# Patient Record
Sex: Female | Born: 1935 | Race: White | Hispanic: No | Marital: Married | Smoking: Never smoker
Health system: Southern US, Community
[De-identification: ages and names within clinical notes are randomized; demographics above are authoritative.]

## PROBLEM LIST (undated history)

## (undated) DIAGNOSIS — I779 Disorder of arteries and arterioles, unspecified: Secondary | ICD-10-CM

## (undated) DIAGNOSIS — I340 Nonrheumatic mitral (valve) insufficiency: Secondary | ICD-10-CM

## (undated) DIAGNOSIS — J449 Chronic obstructive pulmonary disease, unspecified: Secondary | ICD-10-CM

## (undated) DIAGNOSIS — I480 Paroxysmal atrial fibrillation: Secondary | ICD-10-CM

## (undated) DIAGNOSIS — G629 Polyneuropathy, unspecified: Secondary | ICD-10-CM

## (undated) DIAGNOSIS — I509 Heart failure, unspecified: Secondary | ICD-10-CM

## (undated) DIAGNOSIS — I10 Essential (primary) hypertension: Secondary | ICD-10-CM

## (undated) DIAGNOSIS — J45909 Unspecified asthma, uncomplicated: Secondary | ICD-10-CM

## (undated) DIAGNOSIS — E785 Hyperlipidemia, unspecified: Secondary | ICD-10-CM

## (undated) DIAGNOSIS — I050 Rheumatic mitral stenosis: Secondary | ICD-10-CM

## (undated) DIAGNOSIS — H409 Unspecified glaucoma: Secondary | ICD-10-CM

## (undated) DIAGNOSIS — I700 Atherosclerosis of aorta: Secondary | ICD-10-CM

## (undated) DIAGNOSIS — J189 Pneumonia, unspecified organism: Secondary | ICD-10-CM

## (undated) DIAGNOSIS — I2510 Atherosclerotic heart disease of native coronary artery without angina pectoris: Secondary | ICD-10-CM

## (undated) HISTORY — DX: Heart failure, unspecified: I50.9

## (undated) HISTORY — PX: APPENDECTOMY: SHX54

## (undated) HISTORY — PX: TONSILLECTOMY: SUR1361

## (undated) HISTORY — PX: CATARACT EXTRACTION: SUR2

## (undated) HISTORY — PX: CARDIAC CATHETERIZATION: SHX172

## (undated) HISTORY — DX: Nonrheumatic mitral (valve) insufficiency: I34.0

## (undated) HISTORY — DX: Paroxysmal atrial fibrillation: I48.0

## (undated) HISTORY — DX: Essential (primary) hypertension: I10

## (undated) HISTORY — DX: Pneumonia, unspecified organism: J18.9

---

## 1985-08-18 HISTORY — PX: ABDOMINAL HYSTERECTOMY: SHX81

## 2010-03-05 ENCOUNTER — Ambulatory Visit: Payer: Self-pay | Admitting: Family Medicine

## 2010-09-18 ENCOUNTER — Ambulatory Visit: Payer: Self-pay | Admitting: Ophthalmology

## 2010-10-01 ENCOUNTER — Ambulatory Visit: Payer: Self-pay | Admitting: Ophthalmology

## 2010-12-03 ENCOUNTER — Ambulatory Visit: Payer: Self-pay | Admitting: Ophthalmology

## 2010-12-17 ENCOUNTER — Ambulatory Visit: Payer: Self-pay | Admitting: Ophthalmology

## 2012-05-10 ENCOUNTER — Other Ambulatory Visit: Payer: Self-pay | Admitting: Family Medicine

## 2012-05-10 LAB — COMPREHENSIVE METABOLIC PANEL
Albumin: 3.9 g/dL (ref 3.4–5.0)
Alkaline Phosphatase: 86 U/L (ref 50–136)
Anion Gap: 10 (ref 7–16)
BUN: 17 mg/dL (ref 7–18)
Bilirubin,Total: 0.5 mg/dL (ref 0.2–1.0)
Calcium, Total: 10.2 mg/dL — ABNORMAL HIGH (ref 8.5–10.1)
Chloride: 104 mmol/L (ref 98–107)
Co2: 29 mmol/L (ref 21–32)
Creatinine: 0.99 mg/dL (ref 0.60–1.30)
EGFR (African American): >60
EGFR (Non-African Amer.): 55 — ABNORMAL LOW
Glucose: 106 mg/dL — ABNORMAL HIGH (ref 65–99)
Osmolality: 287 (ref 275–301)
Potassium: 3.7 mmol/L (ref 3.5–5.1)
SGOT(AST): 27 U/L (ref 15–37)
SGPT (ALT): 27 U/L (ref 12–78)
Sodium: 143 mmol/L (ref 136–145)
Total Protein: 7.7 g/dL (ref 6.4–8.2)

## 2012-05-10 LAB — CBC WITH DIFFERENTIAL/PLATELET
Basophil #: 0.1 10*3/uL (ref 0.0–0.1)
Basophil %: 1 %
Eosinophil #: 0.2 10*3/uL (ref 0.0–0.7)
Eosinophil %: 2.5 %
HCT: 47 % (ref 35.0–47.0)
HGB: 16 g/dL (ref 12.0–16.0)
Lymphocyte #: 2 10*3/uL (ref 1.0–3.6)
Lymphocyte %: 20.7 %
MCH: 29 pg (ref 26.0–34.0)
MCHC: 34 g/dL (ref 32.0–36.0)
MCV: 85 fL (ref 80–100)
Monocyte #: 0.9 x10 3/mm (ref 0.2–0.9)
Monocyte %: 9.9 %
Neutrophil #: 6.2 10*3/uL (ref 1.4–6.5)
Neutrophil %: 65.9 %
Platelet: 222 10*3/uL (ref 150–440)
RBC: 5.52 10*6/uL — ABNORMAL HIGH (ref 3.80–5.20)
RDW: 13.3 % (ref 11.5–14.5)
WBC: 9.5 10*3/uL (ref 3.6–11.0)

## 2012-05-10 LAB — CK-MB: CK-MB: 1.4 ng/mL (ref 0.5–3.6)

## 2012-05-10 LAB — TSH: Thyroid Stimulating Horm: 2.27 u[IU]/mL

## 2012-05-10 LAB — TROPONIN I: Troponin-I: <0.02 ng/mL

## 2013-11-01 ENCOUNTER — Ambulatory Visit: Payer: Self-pay | Admitting: Family Medicine

## 2014-09-18 ENCOUNTER — Ambulatory Visit: Payer: Self-pay | Admitting: Family Medicine

## 2014-09-18 DIAGNOSIS — R079 Chest pain, unspecified: Secondary | ICD-10-CM | POA: Diagnosis not present

## 2014-09-18 DIAGNOSIS — J811 Chronic pulmonary edema: Secondary | ICD-10-CM | POA: Diagnosis not present

## 2014-09-18 DIAGNOSIS — G629 Polyneuropathy, unspecified: Secondary | ICD-10-CM | POA: Diagnosis not present

## 2014-09-18 DIAGNOSIS — R0789 Other chest pain: Secondary | ICD-10-CM | POA: Diagnosis not present

## 2014-09-18 DIAGNOSIS — R509 Fever, unspecified: Secondary | ICD-10-CM | POA: Diagnosis not present

## 2014-09-18 DIAGNOSIS — R091 Pleurisy: Secondary | ICD-10-CM | POA: Diagnosis not present

## 2014-10-10 DIAGNOSIS — H4011X1 Primary open-angle glaucoma, mild stage: Secondary | ICD-10-CM | POA: Diagnosis not present

## 2015-01-17 DIAGNOSIS — H4011X1 Primary open-angle glaucoma, mild stage: Secondary | ICD-10-CM | POA: Diagnosis not present

## 2015-03-26 ENCOUNTER — Encounter: Payer: Self-pay | Admitting: Family Medicine

## 2015-03-26 ENCOUNTER — Ambulatory Visit (INDEPENDENT_AMBULATORY_CARE_PROVIDER_SITE_OTHER): Payer: Medicare Other | Admitting: Family Medicine

## 2015-03-26 VITALS — BP 120/70 | HR 78 | Temp 97.9°F | Resp 16 | Wt 145.4 lb

## 2015-03-26 DIAGNOSIS — R059 Cough, unspecified: Secondary | ICD-10-CM

## 2015-03-26 DIAGNOSIS — R509 Fever, unspecified: Secondary | ICD-10-CM | POA: Diagnosis not present

## 2015-03-26 DIAGNOSIS — R05 Cough: Secondary | ICD-10-CM

## 2015-03-26 DIAGNOSIS — I493 Ventricular premature depolarization: Secondary | ICD-10-CM | POA: Insufficient documentation

## 2015-03-26 DIAGNOSIS — G629 Polyneuropathy, unspecified: Secondary | ICD-10-CM

## 2015-03-26 DIAGNOSIS — I10 Essential (primary) hypertension: Secondary | ICD-10-CM

## 2015-03-26 DIAGNOSIS — E559 Vitamin D deficiency, unspecified: Secondary | ICD-10-CM

## 2015-03-26 DIAGNOSIS — J45901 Unspecified asthma with (acute) exacerbation: Secondary | ICD-10-CM | POA: Insufficient documentation

## 2015-03-26 DIAGNOSIS — J45909 Unspecified asthma, uncomplicated: Secondary | ICD-10-CM | POA: Insufficient documentation

## 2015-03-26 MED ORDER — AZITHROMYCIN 250 MG PO TABS: ORAL_TABLET | ORAL | Status: DC

## 2015-03-26 NOTE — Progress Notes (Signed)
Patient: Molly Hodges Female    DOB: [birth year]   81 y.o.   MRN: [id number] Visit Date: 03/26/2015  Today's Provider: Vernie Murders, PA   Chief Complaint  Patient presents with  . Sinusitis  . Fever  . Cough   Subjective:    Fever  This is a new problem. The current episode started yesterday (Last night). The problem occurs constantly. The problem has been gradually improving. The maximum temperature noted was 102 to 102.9 F. The temperature was taken using an oral thermometer. Associated symptoms include coughing, headaches, muscle aches and wheezing (a little). Pertinent negatives include no abdominal pain, chest pain, diarrhea, ear pain, nausea, sleepiness, sore throat, urinary pain or vomiting. She has tried acetaminophen and fluids for the symptoms. The treatment provided mild relief.  Sinusitis This is a new problem. The current episode started in the past 7 days. The problem has been gradually worsening since onset. The maximum temperature recorded prior to her arrival was 102 - 102.9 F. The fever has been present for less than 1 day. Her pain is at a severity of 0/10. She is experiencing no pain. Associated symptoms include chills, coughing, headaches and sinus pressure. Pertinent negatives include no ear pain, hoarse voice, sneezing or sore throat. Past treatments include acetaminophen, saline sprays and oral decongestants. The treatment provided mild relief.  Cough This is a new problem. The current episode started in the past 7 days. The problem has been unchanged. The cough is productive of brown sputum (sometimes). Associated symptoms include chills, a fever, headaches, nasal congestion, postnasal drip, rhinorrhea, sweats and wheezing (a little). Pertinent negatives include no chest pain, ear congestion, ear pain, sore throat or weight loss. The symptoms are aggravated by other. She has tried rest (Just Advair) for the symptoms. The treatment provided mild relief. Her  past medical history is significant for asthma.   History reviewed. No pertinent past medical history. Patient Active Problem List   Diagnosis Date Noted  . Premature ventricular contraction 03/26/2015  . Vitamin D deficiency 03/26/2015  . Neuropathy 03/26/2015  . Hypertension 03/26/2015  . COPD (chronic obstructive pulmonary disease) 03/26/2015  . Asthma 03/26/2015   Past Surgical History  Procedure Laterality Date  . Abdominal hysterectomy  1987    due to heavy bleeding   History reviewed. No pertinent family history.    Allergies  Allergen Reactions  . Shellfish Allergy   . Tamiflu [Oseltamivir Phosphate]     Hallucination   Previous Medications   CHOLECALCIFEROL (VITAMIN D) 1000 UNITS TABLET    Take 1,000 Units by mouth daily.   DORZOLAMIDE (TRUSOPT) 2 % OPHTHALMIC SOLUTION    1 drop 2 (two) times daily.    FLUTICASONE-SALMETEROL (ADVAIR DISKUS) 250-50 MCG/DOSE AEPB    Inhale 1 puff into the lungs 2 (two) times daily.   GABAPENTIN (NEURONTIN) 300 MG CAPSULE    Take 300 mg by mouth at bedtime.    HYDROCHLOROTHIAZIDE (MICROZIDE) 12.5 MG CAPSULE    Take 12.5 mg by mouth daily.   LATANOPROST (XALATAN) 0.005 % OPHTHALMIC SOLUTION    1 drop at bedtime.   MAGNESIUM OXIDE (MAG-OX) 400 MG TABLET    Take 400 mg by mouth daily.   OMEGA-3 FATTY ACIDS (FISH OIL BURP-LESS) 1000 MG CAPS    Take 1 capsule by mouth daily.    Review of Systems  Constitutional: Positive for fever and chills. Negative for weight loss and appetite change.  HENT: Positive for postnasal drip, rhinorrhea  and sinus pressure. Negative for ear discharge, ear pain, hoarse voice, sneezing, sore throat and trouble swallowing.   Eyes: Negative.   Respiratory: Positive for cough, chest tightness (sometimes, chest congestion) and wheezing (a little).   Cardiovascular: Negative.  Negative for chest pain and palpitations.  Gastrointestinal: Negative.  Negative for nausea, vomiting, abdominal pain and diarrhea.    Endocrine: Negative.   Genitourinary: Negative.  Negative for dysuria.  Musculoskeletal: Negative.   Skin: Negative.   Allergic/Immunologic: Negative.   Neurological: Positive for weakness (is because of the fever per pt) and headaches. Negative for dizziness.  Hematological: Negative.   Psychiatric/Behavioral: Negative.     History  Substance Use Topics  . Smoking status: Never Smoker   . Smokeless tobacco: Never Used  . Alcohol Use: No   Objective:   BP 120/70 mmHg  Pulse 78  Temp(Src) 97.9 F (36.6 C) (Oral)  Resp 16  Wt 145 lb 6.4 oz (65.953 kg)  SpO2 95%  Physical Exam  Constitutional: She is oriented to person, place, and time. She appears well-developed and well-nourished.  HENT:  Head: Normocephalic and atraumatic.  Right Ear: External ear normal.  Left Ear: External ear normal.  Nose: Nose normal.  Mouth/Throat: Oropharynx is clear and moist.  Eyes: Conjunctivae and EOM are normal. Pupils are equal, round, and reactive to light.  Neck: Normal range of motion. Neck supple.  Cardiovascular: Normal rate, regular rhythm and normal heart sounds.   Pulmonary/Chest: Effort normal. She has rales.  Few fine rales in right posterior base  Abdominal: Soft. Bowel sounds are normal.  Neurological: She is alert and oriented to person, place, and time.  Skin: Skin is warm and dry. No rash noted.      Assessment & Plan:     1. Fever and chills Onset a week ago. Felt it resolved in 2 days with use of Tylenol. Has had intermittent return of fever and feels the cough and congestion has increased. States she feels the symptoms are similar to past pneumonia episodes. Mucinex-DM helps a lot with cough and congestion. Will check CBC and start Z-pak. If cough and congestion returns, may need recheck of CXR. - CBC with Differential/Platelet - azithromycin (ZITHROMAX) 250 MG tablet; Two tablets by mouth today then one daily for 4 days  Dispense: 6 tablet; Refill: 0  2. Cough Mild  today. Has some brown sputum production earlier this week. Netti-Pot and Mucinex-DM is helping to control cough and congestion. May use Tylenol prn aches, headache and fever. Increase fluid intake and recheck pending lab report.        Vernie Murders, PA  [facility]

## 2015-03-27 ENCOUNTER — Telehealth: Payer: Self-pay | Admitting: Family Medicine

## 2015-03-27 LAB — CBC WITH DIFFERENTIAL/PLATELET
Basophils Absolute: 0.1 10*3/uL (ref 0.0–0.2)
Basos: 0 %
EOS (ABSOLUTE): 0.1 10*3/uL (ref 0.0–0.4)
Eos: 0 %
Hematocrit: 46.4 % (ref 34.0–46.6)
Hemoglobin: 15.8 g/dL (ref 11.1–15.9)
Immature Grans (Abs): 0 10*3/uL (ref 0.0–0.1)
Immature Granulocytes: 0 %
Lymphocytes Absolute: 2.1 10*3/uL (ref 0.7–3.1)
Lymphs: 10 %
MCH: 29 pg (ref 26.6–33.0)
MCHC: 34.1 g/dL (ref 31.5–35.7)
MCV: 85 fL (ref 79–97)
Monocytes Absolute: 2.1 10*3/uL — ABNORMAL HIGH (ref 0.1–0.9)
Monocytes: 10 %
NRBC: 0 % (ref 0–0)
Neutrophils Absolute: 17.4 10*3/uL — ABNORMAL HIGH (ref 1.4–7.0)
Neutrophils: 80 %
Platelets: 265 10*3/uL (ref 150–379)
RBC: 5.44 x10E6/uL — ABNORMAL HIGH (ref 3.77–5.28)
RDW: 13.7 % (ref 12.3–15.4)
WBC: 21.7 10*3/uL (ref 3.4–10.8)

## 2015-03-27 NOTE — Telephone Encounter (Signed)
Pt's husband called because they are having trouble with there home phone so they were issued a new number and wanted to make sure we have it. I put it in the contact info and updated it to the pt's chart as her work# because he doesn't know if it is temporary or if it will become permanent. Thanks TNP

## 2015-03-28 ENCOUNTER — Telehealth: Payer: Self-pay | Admitting: Family Medicine

## 2015-03-28 ENCOUNTER — Ambulatory Visit
Admission: RE | Admit: 2015-03-28 | Discharge: 2015-03-28 | Disposition: A | Discharge status: Home / Self Care | Payer: Medicare Other | Source: Ambulatory Visit | Attending: Family Medicine | Admitting: Family Medicine

## 2015-03-28 DIAGNOSIS — R05 Cough: Secondary | ICD-10-CM

## 2015-03-28 DIAGNOSIS — R918 Other nonspecific abnormal finding of lung field: Secondary | ICD-10-CM | POA: Insufficient documentation

## 2015-03-28 DIAGNOSIS — R059 Cough, unspecified: Secondary | ICD-10-CM

## 2015-03-28 MED ORDER — LEVOFLOXACIN 500 MG PO TABS: 500.0000 mg | ORAL_TABLET | Freq: Every day | ORAL | Status: DC

## 2015-03-28 NOTE — Telephone Encounter (Signed)
Please advise results? 

## 2015-03-28 NOTE — Telephone Encounter (Signed)
Labs show elevated white blood cell count consistent with bacterial infection. If not feeling better then she needs to change antibiotic to Levoquin 500mg  one tablet daily, #7. Also need to get chest XR if she is still coughing then she needs to get a chest XR today. Please advise and enter order for chest XR. Thanks.

## 2015-03-28 NOTE — Telephone Encounter (Signed)
Pt's husband called saying she was still having a fever and not feeling well.  Wanting to know if you had the test results back.  Thanks, [employer]

## 2015-03-28 NOTE — Telephone Encounter (Signed)
Patient's husband [name] was notified of results. Patient is still coughing and running a fever. Levaquin rx sent to pharmacy and x-ray was ordered.

## 2015-04-05 ENCOUNTER — Ambulatory Visit (INDEPENDENT_AMBULATORY_CARE_PROVIDER_SITE_OTHER): Payer: Medicare Other | Admitting: Family Medicine

## 2015-04-05 ENCOUNTER — Encounter: Payer: Self-pay | Admitting: Family Medicine

## 2015-04-05 VITALS — BP 132/62 | HR 92 | Temp 98.0°F | Resp 18 | Wt 145.0 lb

## 2015-04-05 DIAGNOSIS — J189 Pneumonia, unspecified organism: Secondary | ICD-10-CM

## 2015-04-05 NOTE — Patient Instructions (Signed)
   Need to recheck chest Xray in 2-3 weeks.

## 2015-04-05 NOTE — Progress Notes (Signed)
Patient: Molly Hodges Female    DOB: 09/17/1931   81 y.o.   MRN: [id number] Visit Date: 04/05/2015  Today's Provider: Lelon Huh, MD   Chief Complaint  Patient presents with  . Pneumonia    follow up   Subjective:    Pneumonia She complains of cough. There is no chest tightness, frequent throat clearing, hemoptysis, hoarse voice, shortness of breath, sputum production or wheezing. Episode onset: diagnosed on 03/29/2015 by chest x ray. The problem has been gradually improving. Pertinent negatives include no appetite change, chest pain or fever. Her past medical history is significant for asthma.  Patient was last seen by Vernie Murders PA-C on 03/26/2015 for evaluation of chills and cough. Patient was started on Z pack and labs were ordered showing elevated WBC's. Antibiotic was changed to Levaquin and chest x ray was ordered which showed Pneumonia in Right Lower lung. Follow up XR was recommened to enxure clearing. Patient was advised to continue Levaquin and follow up in 1 week. Today patient comes in stating she feels much better but still feels weak. Patient has completed taking the Levaquin and she reports she took the last dose 2 days ago. Patient has not had any fevers recently. She has had some dyspnea on exertion but she reports that is due to her history of Asthma.       Allergies  Allergen Reactions  . Shellfish Allergy   . Tamiflu [Oseltamivir Phosphate]     Hallucination   Previous Medications   CHOLECALCIFEROL (VITAMIN D) 1000 UNITS TABLET    Take 1,000 Units by mouth daily.   DORZOLAMIDE (TRUSOPT) 2 % OPHTHALMIC SOLUTION    1 drop 2 (two) times daily.    FLUTICASONE-SALMETEROL (ADVAIR DISKUS) 250-50 MCG/DOSE AEPB    Inhale 1 puff into the lungs 2 (two) times daily.   GABAPENTIN (NEURONTIN) 300 MG CAPSULE    Take 300 mg by mouth at bedtime.    HYDROCHLOROTHIAZIDE (MICROZIDE) 12.5 MG CAPSULE    Take 12.5 mg by mouth daily.   LATANOPROST (XALATAN) 0.005 %  OPHTHALMIC SOLUTION    1 drop at bedtime.   MAGNESIUM OXIDE (MAG-OX) 400 MG TABLET    Take 400 mg by mouth daily.   OMEGA-3 FATTY ACIDS (FISH OIL BURP-LESS) 1000 MG CAPS    Take 1 capsule by mouth daily.    Review of Systems  Constitutional: Negative for fever, chills, appetite change and fatigue.  HENT: Negative for hoarse voice.   Respiratory: Positive for cough. Negative for hemoptysis, sputum production, chest tightness, shortness of breath and wheezing.   Cardiovascular: Negative for chest pain and palpitations.  Gastrointestinal: Negative for nausea, vomiting and abdominal pain.  Neurological: Negative for dizziness and weakness.    Social History  Substance Use Topics  . Smoking status: Never Smoker   . Smokeless tobacco: Never Used  . Alcohol Use: No   Objective:   BP 132/62 mmHg  Pulse 92  Temp(Src) 98 F (36.7 C) (Oral)  Resp 18  Wt 145 lb (65.772 kg)  SpO2 96%  Physical Exam  General Appearance:    Alert, cooperative, no distress  Eyes:    PERRL, conjunctiva/corneas clear, EOM's intact       Lungs:     Clear to auscultation bilaterally, respirations unlabored  Heart:    Regular rate and rhythm  Neurologic:   Awake, alert, oriented x 3. No apparent focal neurological           defect.  Assessment & Plan:     1. Pneumonia involving left lung, unspecified part of lung Clinically resolved. Repeat CXR in about 2 weeks as recommended by radiology.        Lelon Huh, MD  [facility]

## 2015-04-12 ENCOUNTER — Other Ambulatory Visit: Payer: Self-pay | Admitting: Family Medicine

## 2015-05-18 ENCOUNTER — Telehealth: Payer: Self-pay | Admitting: Family Medicine

## 2015-05-18 DIAGNOSIS — J189 Pneumonia, unspecified organism: Secondary | ICD-10-CM

## 2015-05-18 NOTE — Telephone Encounter (Signed)
-----   Message from Birdie Sons, MD sent at 04/05/2015 12:00 PM EDT ----- Regarding: remind to get follow up chest Xray  Future order already sent to due exam around 8-27

## 2015-05-18 NOTE — Telephone Encounter (Signed)
Left message to call back  

## 2015-05-18 NOTE — Telephone Encounter (Signed)
Please advise patient it is time to recheck chest XR to make sure it had cleared up since finishing antibiotic. Future order is in chart...  I'm not if the order is still good since it was entered in August, may need to call radiology. Thanks.

## 2015-05-21 NOTE — Telephone Encounter (Signed)
Pt returned call. Thanks TNP °

## 2015-05-22 NOTE — Telephone Encounter (Signed)
Patient notified. Reordered x-ray, original order expired 05/05/2015.

## 2015-05-23 ENCOUNTER — Ambulatory Visit
Admission: RE | Admit: 2015-05-23 | Discharge: 2015-05-23 | Disposition: A | Discharge status: Home / Self Care | Payer: Medicare Other | Source: Ambulatory Visit | Attending: Family Medicine | Admitting: Family Medicine

## 2015-05-23 DIAGNOSIS — J189 Pneumonia, unspecified organism: Secondary | ICD-10-CM | POA: Insufficient documentation

## 2015-05-23 DIAGNOSIS — J449 Chronic obstructive pulmonary disease, unspecified: Secondary | ICD-10-CM | POA: Diagnosis not present

## 2015-07-09 ENCOUNTER — Other Ambulatory Visit: Payer: Self-pay | Admitting: Family Medicine

## 2015-07-25 ENCOUNTER — Other Ambulatory Visit: Payer: Self-pay | Admitting: Family Medicine

## 2015-07-31 DIAGNOSIS — H401131 Primary open-angle glaucoma, bilateral, mild stage: Secondary | ICD-10-CM | POA: Diagnosis not present

## 2015-08-22 ENCOUNTER — Ambulatory Visit (INDEPENDENT_AMBULATORY_CARE_PROVIDER_SITE_OTHER): Payer: Medicare Other | Admitting: Family Medicine

## 2015-08-22 ENCOUNTER — Encounter: Payer: Self-pay | Admitting: Family Medicine

## 2015-08-22 ENCOUNTER — Telehealth: Payer: Self-pay

## 2015-08-22 VITALS — BP 110/70 | HR 80 | Temp 97.8°F | Resp 16 | Wt 143.0 lb

## 2015-08-22 DIAGNOSIS — J40 Bronchitis, not specified as acute or chronic: Secondary | ICD-10-CM

## 2015-08-22 DIAGNOSIS — J45901 Unspecified asthma with (acute) exacerbation: Secondary | ICD-10-CM

## 2015-08-22 MED ORDER — PREDNISONE 10 MG PO TABS: ORAL_TABLET | ORAL | Status: AC

## 2015-08-22 MED ORDER — AMOXICILLIN 500 MG PO CAPS: 1000.0000 mg | ORAL_CAPSULE | Freq: Two times a day (BID) | ORAL | Status: AC

## 2015-08-22 NOTE — Progress Notes (Signed)
Patient: Molly Hodges Female    DOB: 03-23-1932   82 y.o.   MRN: [id number] Visit Date: 08/22/2015  Today's Provider: Lelon Huh, MD   Chief Complaint  Patient presents with  . Asthma  . Cough   Subjective:    Asthma She complains of cough, difficulty breathing, hoarse voice, shortness of breath, sputum production and wheezing. There is no chest tightness or frequent throat clearing. The cough is productive, hacking, hoarse and nocturnal. Associated symptoms include dyspnea on exertion, headaches, malaise/fatigue and sweats. Pertinent negatives include no appetite change, chest pain, ear congestion, ear pain, fever, heartburn, myalgias, nasal congestion, postnasal drip, sneezing, sore throat or trouble swallowing. Her symptoms are aggravated by exercise, lying down, climbing stairs and any activity. Her past medical history is significant for asthma.  Cough Associated symptoms include headaches, shortness of breath, sweats and wheezing. Pertinent negatives include no chest pain, chills, ear congestion, ear pain, fever, heartburn, myalgias, nasal congestion, postnasal drip or sore throat. Her past medical history is significant for asthma.   Cough started 3 or 4 days ago. Has sob, cough, sweats, and headaches.  Had some sweats overnight, but no fevers. No nasal or sinus congestion, and nasal drainage has been clear. Tried Proventil, but she can't tolerate it due to racing heart.    Allergies  Allergen Reactions  . Shellfish Allergy   . Tamiflu [Oseltamivir Phosphate]     Hallucination   Previous Medications   CHOLECALCIFEROL (VITAMIN D) 1000 UNITS TABLET    Take 1,000 Units by mouth daily.   DORZOLAMIDE (TRUSOPT) 2 % OPHTHALMIC SOLUTION    1 drop 2 (two) times daily.    FLUTICASONE-SALMETEROL (ADVAIR DISKUS) 250-50 MCG/DOSE AEPB    Inhale 1 puff into the lungs 2 (two) times daily.   GABAPENTIN (NEURONTIN) 300 MG CAPSULE    TAKE ONE CAPSULE BY MOUTH EVERY MORNING AND TWO  CAPSULES AT BEDTIME   HYDROCHLOROTHIAZIDE (MICROZIDE) 12.5 MG CAPSULE    TAKE ONE (1) CAPSULE EACH DAY   LATANOPROST (XALATAN) 0.005 % OPHTHALMIC SOLUTION    1 drop at bedtime.   MAGNESIUM OXIDE (MAG-OX) 400 MG TABLET    Take 400 mg by mouth daily.   OMEGA-3 FATTY ACIDS (FISH OIL BURP-LESS) 1000 MG CAPS    Take 1 capsule by mouth daily.    Review of Systems  Constitutional: Positive for malaise/fatigue. Negative for fever, chills, appetite change and fatigue.  HENT: Positive for congestion and hoarse voice. Negative for ear pain, postnasal drip, sneezing, sore throat and trouble swallowing.   Respiratory: Positive for cough, sputum production, shortness of breath and wheezing. Negative for chest tightness.   Cardiovascular: Positive for dyspnea on exertion. Negative for chest pain and palpitations.  Gastrointestinal: Negative for heartburn, nausea, vomiting and abdominal pain.  Musculoskeletal: Negative for myalgias.  Neurological: Positive for headaches. Negative for dizziness and weakness.    Social History  Substance Use Topics  . Smoking status: Never Smoker   . Smokeless tobacco: Never Used  . Alcohol Use: No   Objective:   BP 110/70 mmHg  Pulse 80  Temp(Src) 97.8 F (36.6 C) (Oral)  Resp 16  Wt 143 lb (64.864 kg)  SpO2 100%  Physical Exam  General Appearance:    Alert, cooperative, no distress  HENT:   ENT exam normal, no neck nodes or sinus tenderness  Eyes:    PERRL, conjunctiva/corneas clear, EOM's intact       Lungs:    Occasional expiratory  wheezes, no rales, no rhonchi, respirations unlabored  Heart:    Regular rate and rhythm  Neurologic:   Awake, alert, oriented x 3. No apparent focal neurological           defect.           Assessment & Plan:     1. Asthma exacerbation  - predniSONE (DELTASONE) 10 MG tablet; 6 tablets for 2 days, then 5 for 2 days, then 4 for 2 days, then 3 for 2 days, then 2 for 2 days, then 1 for 2 days.  Dispense: 42 tablet; Refill:  0  2. Bronchitis  - amoxicillin (AMOXIL) 500 MG capsule; Take 2 capsules (1,000 mg total) by mouth 2 (two) times daily.  Dispense: 40 capsule; Refill: 0       Lelon Huh, MD  [facility]

## 2015-08-22 NOTE — Telephone Encounter (Signed)
Patient called saying that she has had difficulty breathing and a severe cough that worsened yesterday. Patient reports that she does have history of asthma, and she takes Advair on a daily basis. Patient denies fever. She does have wheezing and runny nose. She also mentions that she woke up in a cold sweat this morning. She reports that her symptoms are made worse when lying down. She has not been taking OTC for symptoms.

## 2015-09-17 ENCOUNTER — Inpatient Hospital Stay: Payer: Medicare Other

## 2015-09-17 ENCOUNTER — Inpatient Hospital Stay (HOSPITAL_COMMUNITY)
Admit: 2015-09-17 | Discharge: 2015-09-17 | Disposition: A | Discharge status: Home / Self Care | Payer: Medicare Other | Attending: Internal Medicine | Admitting: Internal Medicine

## 2015-09-17 ENCOUNTER — Inpatient Hospital Stay
Admission: EM | Admit: 2015-09-17 | Discharge: 2015-09-23 | DRG: 871 | Disposition: A | Discharge status: Home / Self Care | Payer: Medicare Other | Attending: Internal Medicine | Admitting: Internal Medicine

## 2015-09-17 ENCOUNTER — Ambulatory Visit (INDEPENDENT_AMBULATORY_CARE_PROVIDER_SITE_OTHER): Payer: Medicare Other | Admitting: Family Medicine

## 2015-09-17 ENCOUNTER — Telehealth: Payer: Self-pay | Admitting: *Deleted

## 2015-09-17 ENCOUNTER — Encounter: Payer: Self-pay | Admitting: Family Medicine

## 2015-09-17 ENCOUNTER — Ambulatory Visit
Admission: RE | Admit: 2015-09-17 | Discharge: 2015-09-17 | Disposition: A | Discharge status: Home / Self Care | Payer: Medicare Other | Source: Ambulatory Visit | Attending: Family Medicine | Admitting: Family Medicine

## 2015-09-17 ENCOUNTER — Encounter: Payer: Self-pay | Admitting: Emergency Medicine

## 2015-09-17 VITALS — BP 132/60 | HR 130 | Temp 100.7°F | Resp 26 | Wt 146.0 lb

## 2015-09-17 DIAGNOSIS — R059 Cough, unspecified: Secondary | ICD-10-CM

## 2015-09-17 DIAGNOSIS — I4819 Other persistent atrial fibrillation: Secondary | ICD-10-CM

## 2015-09-17 DIAGNOSIS — E876 Hypokalemia: Secondary | ICD-10-CM | POA: Diagnosis present

## 2015-09-17 DIAGNOSIS — R0902 Hypoxemia: Secondary | ICD-10-CM | POA: Diagnosis not present

## 2015-09-17 DIAGNOSIS — Z90710 Acquired absence of both cervix and uterus: Secondary | ICD-10-CM

## 2015-09-17 DIAGNOSIS — Z8249 Family history of ischemic heart disease and other diseases of the circulatory system: Secondary | ICD-10-CM | POA: Diagnosis not present

## 2015-09-17 DIAGNOSIS — I480 Paroxysmal atrial fibrillation: Secondary | ICD-10-CM | POA: Diagnosis not present

## 2015-09-17 DIAGNOSIS — I2510 Atherosclerotic heart disease of native coronary artery without angina pectoris: Secondary | ICD-10-CM | POA: Diagnosis present

## 2015-09-17 DIAGNOSIS — Z9849 Cataract extraction status, unspecified eye: Secondary | ICD-10-CM

## 2015-09-17 DIAGNOSIS — Z79899 Other long term (current) drug therapy: Secondary | ICD-10-CM | POA: Diagnosis not present

## 2015-09-17 DIAGNOSIS — R509 Fever, unspecified: Secondary | ICD-10-CM

## 2015-09-17 DIAGNOSIS — I4891 Unspecified atrial fibrillation: Secondary | ICD-10-CM

## 2015-09-17 DIAGNOSIS — A419 Sepsis, unspecified organism: Secondary | ICD-10-CM | POA: Diagnosis not present

## 2015-09-17 DIAGNOSIS — N390 Urinary tract infection, site not specified: Secondary | ICD-10-CM | POA: Diagnosis not present

## 2015-09-17 DIAGNOSIS — J189 Pneumonia, unspecified organism: Secondary | ICD-10-CM

## 2015-09-17 DIAGNOSIS — I10 Essential (primary) hypertension: Secondary | ICD-10-CM | POA: Diagnosis not present

## 2015-09-17 DIAGNOSIS — T380X5A Adverse effect of glucocorticoids and synthetic analogues, initial encounter: Secondary | ICD-10-CM | POA: Diagnosis not present

## 2015-09-17 DIAGNOSIS — R05 Cough: Secondary | ICD-10-CM

## 2015-09-17 DIAGNOSIS — R778 Other specified abnormalities of plasma proteins: Secondary | ICD-10-CM

## 2015-09-17 DIAGNOSIS — R911 Solitary pulmonary nodule: Secondary | ICD-10-CM | POA: Diagnosis not present

## 2015-09-17 DIAGNOSIS — Z7951 Long term (current) use of inhaled steroids: Secondary | ICD-10-CM

## 2015-09-17 DIAGNOSIS — R0602 Shortness of breath: Secondary | ICD-10-CM | POA: Diagnosis not present

## 2015-09-17 DIAGNOSIS — J181 Lobar pneumonia, unspecified organism: Secondary | ICD-10-CM

## 2015-09-17 DIAGNOSIS — J441 Chronic obstructive pulmonary disease with (acute) exacerbation: Secondary | ICD-10-CM | POA: Diagnosis present

## 2015-09-17 DIAGNOSIS — J9601 Acute respiratory failure with hypoxia: Secondary | ICD-10-CM | POA: Diagnosis present

## 2015-09-17 DIAGNOSIS — J45909 Unspecified asthma, uncomplicated: Secondary | ICD-10-CM | POA: Diagnosis not present

## 2015-09-17 DIAGNOSIS — J440 Chronic obstructive pulmonary disease with acute lower respiratory infection: Secondary | ICD-10-CM | POA: Diagnosis present

## 2015-09-17 DIAGNOSIS — Z888 Allergy status to other drugs, medicaments and biological substances status: Secondary | ICD-10-CM | POA: Diagnosis not present

## 2015-09-17 DIAGNOSIS — R748 Abnormal levels of other serum enzymes: Secondary | ICD-10-CM | POA: Diagnosis not present

## 2015-09-17 DIAGNOSIS — T50905A Adverse effect of unspecified drugs, medicaments and biological substances, initial encounter: Secondary | ICD-10-CM

## 2015-09-17 DIAGNOSIS — Z9049 Acquired absence of other specified parts of digestive tract: Secondary | ICD-10-CM | POA: Diagnosis not present

## 2015-09-17 DIAGNOSIS — Z8701 Personal history of pneumonia (recurrent): Secondary | ICD-10-CM

## 2015-09-17 DIAGNOSIS — R7989 Other specified abnormal findings of blood chemistry: Secondary | ICD-10-CM | POA: Diagnosis not present

## 2015-09-17 DIAGNOSIS — Z7189 Other specified counseling: Secondary | ICD-10-CM | POA: Insufficient documentation

## 2015-09-17 HISTORY — DX: Other persistent atrial fibrillation: I48.19

## 2015-09-17 HISTORY — DX: Paroxysmal atrial fibrillation: I48.0

## 2015-09-17 HISTORY — DX: Chronic obstructive pulmonary disease, unspecified: J44.9

## 2015-09-17 HISTORY — DX: Polyneuropathy, unspecified: G62.9

## 2015-09-17 HISTORY — DX: Unspecified asthma, uncomplicated: J45.909

## 2015-09-17 LAB — URINALYSIS COMPLETE WITH MICROSCOPIC (ARMC ONLY)
Bacteria, UA: NONE SEEN
Bilirubin Urine: NEGATIVE
Glucose, UA: NEGATIVE mg/dL
Ketones, ur: NEGATIVE mg/dL
Nitrite: NEGATIVE
Protein, ur: 100 mg/dL — AB
Specific Gravity, Urine: 1.029 (ref 1.005–1.030)
Squamous Epithelial / LPF: NONE SEEN
pH: 5 (ref 5.0–8.0)

## 2015-09-17 LAB — CBC WITH DIFFERENTIAL/PLATELET
Basophils Absolute: 0.1 10*3/uL (ref 0–0.1)
Basophils Relative: 0 %
Eosinophils Absolute: 0 10*3/uL (ref 0–0.7)
Eosinophils Relative: 0 %
HCT: 41 % (ref 35.0–47.0)
Hemoglobin: 13.6 g/dL (ref 12.0–16.0)
Lymphocytes Relative: 4 %
Lymphs Abs: 0.9 10*3/uL — ABNORMAL LOW (ref 1.0–3.6)
MCH: 27.6 pg (ref 26.0–34.0)
MCHC: 33.2 g/dL (ref 32.0–36.0)
MCV: 83.3 fL (ref 80.0–100.0)
Monocytes Absolute: 1.6 10*3/uL — ABNORMAL HIGH (ref 0.2–0.9)
Monocytes Relative: 7 %
Neutro Abs: 18.7 10*3/uL — ABNORMAL HIGH (ref 1.4–6.5)
Neutrophils Relative %: 89 %
Platelets: 215 10*3/uL (ref 150–440)
RBC: 4.92 MIL/uL (ref 3.80–5.20)
RDW: 13.6 % (ref 11.5–14.5)
WBC: 21.2 10*3/uL — ABNORMAL HIGH (ref 3.6–11.0)

## 2015-09-17 LAB — TROPONIN I
Troponin I: 0.04 ng/mL — ABNORMAL HIGH (ref <0.031)
Troponin I: 0.07 ng/mL — ABNORMAL HIGH (ref <0.031)

## 2015-09-17 LAB — COMPREHENSIVE METABOLIC PANEL
ALT: 20 U/L (ref 14–54)
AST: 20 U/L (ref 15–41)
Albumin: 3.6 g/dL (ref 3.5–5.0)
Alkaline Phosphatase: 70 U/L (ref 38–126)
Anion gap: 10 (ref 5–15)
BUN: 13 mg/dL (ref 6–20)
CO2: 21 mmol/L — ABNORMAL LOW (ref 22–32)
Calcium: 8.4 mg/dL — ABNORMAL LOW (ref 8.9–10.3)
Chloride: 103 mmol/L (ref 101–111)
Creatinine, Ser: 1 mg/dL (ref 0.44–1.00)
GFR calc Af Amer: 60 mL/min — ABNORMAL LOW (ref >60)
GFR calc non Af Amer: 51 mL/min — ABNORMAL LOW (ref >60)
Glucose, Bld: 160 mg/dL — ABNORMAL HIGH (ref 65–99)
Potassium: 3.4 mmol/L — ABNORMAL LOW (ref 3.5–5.1)
Sodium: 134 mmol/L — ABNORMAL LOW (ref 135–145)
Total Bilirubin: 1 mg/dL (ref 0.3–1.2)
Total Protein: 7.1 g/dL (ref 6.5–8.1)

## 2015-09-17 LAB — LACTIC ACID, PLASMA: Lactic Acid, Venous: 1.8 mmol/L (ref 0.5–2.0)

## 2015-09-17 LAB — BLOOD GAS, VENOUS
Acid-base deficit: 0.8 mmol/L (ref 0.0–2.0)
Bicarbonate: 24.3 mEq/L (ref 21.0–28.0)
FIO2: 21
Patient temperature: 37
pCO2, Ven: 41 mmHg — ABNORMAL LOW (ref 44.0–60.0)
pH, Ven: 7.38 (ref 7.320–7.430)
pO2, Ven: <31 mmHg (ref 30.0–45.0)

## 2015-09-17 LAB — POCT INFLUENZA A/B
Influenza A, POC: NEGATIVE
Influenza B, POC: NEGATIVE

## 2015-09-17 MED ORDER — DORZOLAMIDE HCL 2 % OP SOLN
1.0000 [drp] | Freq: Two times a day (BID) | OPHTHALMIC | Status: DC
  Administered 2015-09-17 – 2015-09-23 (×12): 1 [drp] via OPHTHALMIC
  Filled 2015-09-17: qty 10

## 2015-09-17 MED ORDER — ACETAMINOPHEN 325 MG PO TABS
650.0000 mg | ORAL_TABLET | Freq: Four times a day (QID) | ORAL | Status: DC | PRN
  Administered 2015-09-17 – 2015-09-19 (×3): 650 mg via ORAL
  Filled 2015-09-17 (×3): qty 2

## 2015-09-17 MED ORDER — CEFTRIAXONE SODIUM 1 G IJ SOLR
1.0000 g | Freq: Once | INTRAMUSCULAR | Status: AC
  Administered 2015-09-17: 1 g via INTRAVENOUS
  Filled 2015-09-17: qty 10

## 2015-09-17 MED ORDER — DEXTROSE 5 % IV SOLN
500.0000 mg | Freq: Once | INTRAVENOUS | Status: DC
  Administered 2015-09-17: 500 mg via INTRAVENOUS
  Filled 2015-09-17: qty 500

## 2015-09-17 MED ORDER — LATANOPROST 0.005 % OP SOLN
1.0000 [drp] | Freq: Every day | OPHTHALMIC | Status: DC
  Administered 2015-09-17 – 2015-09-22 (×6): 1 [drp] via OPHTHALMIC
  Filled 2015-09-17: qty 2.5

## 2015-09-17 MED ORDER — SODIUM CHLORIDE 0.9 % IV BOLUS (SEPSIS)
1000.0000 mL | Freq: Once | INTRAVENOUS | Status: AC
  Administered 2015-09-17: 1000 mL via INTRAVENOUS

## 2015-09-17 MED ORDER — DEXTROSE 5 % IV SOLN: 1.0000 g | INTRAVENOUS | Status: DC

## 2015-09-17 MED ORDER — LEVOFLOXACIN IN D5W 250 MG/50ML IV SOLN
250.0000 mg | INTRAVENOUS | Status: DC
  Filled 2015-09-17: qty 50

## 2015-09-17 MED ORDER — OMEGA-3-ACID ETHYL ESTERS 1 G PO CAPS
1000.0000 mg | ORAL_CAPSULE | Freq: Every day | ORAL | Status: DC
  Administered 2015-09-18 – 2015-09-23 (×6): 1000 mg via ORAL
  Filled 2015-09-17 (×6): qty 1

## 2015-09-17 MED ORDER — MAGNESIUM OXIDE 400 (241.3 MG) MG PO TABS
400.0000 mg | ORAL_TABLET | Freq: Every day | ORAL | Status: DC
  Administered 2015-09-18 – 2015-09-23 (×6): 400 mg via ORAL
  Filled 2015-09-17 (×6): qty 1

## 2015-09-17 MED ORDER — VITAMIN D 1000 UNITS PO TABS
1000.0000 [IU] | ORAL_TABLET | Freq: Every day | ORAL | Status: DC
  Administered 2015-09-18 – 2015-09-23 (×6): 1000 [IU] via ORAL
  Filled 2015-09-17 (×6): qty 1

## 2015-09-17 MED ORDER — ENOXAPARIN SODIUM 40 MG/0.4ML ~~LOC~~ SOLN
40.0000 mg | SUBCUTANEOUS | Status: DC
  Administered 2015-09-17: 40 mg via SUBCUTANEOUS
  Filled 2015-09-17: qty 0.4

## 2015-09-17 MED ORDER — METOPROLOL TARTRATE 25 MG PO TABS
25.0000 mg | ORAL_TABLET | Freq: Three times a day (TID) | ORAL | Status: DC
  Administered 2015-09-17: 25 mg via ORAL
  Filled 2015-09-17 (×2): qty 1

## 2015-09-17 MED ORDER — VITAMIN C 500 MG PO TABS
1000.0000 mg | ORAL_TABLET | Freq: Every day | ORAL | Status: DC
  Administered 2015-09-18 – 2015-09-23 (×6): 1000 mg via ORAL
  Filled 2015-09-17 (×6): qty 2

## 2015-09-17 MED ORDER — DILTIAZEM HCL 25 MG/5ML IV SOLN
5.0000 mg | Freq: Once | INTRAVENOUS | Status: AC
  Administered 2015-09-17: 5 mg via INTRAVENOUS
  Filled 2015-09-17: qty 5

## 2015-09-17 MED ORDER — ACETAMINOPHEN 650 MG RE SUPP: 650.0000 mg | Freq: Four times a day (QID) | RECTAL | Status: DC | PRN

## 2015-09-17 MED ORDER — ONDANSETRON HCL 4 MG/2ML IJ SOLN
4.0000 mg | Freq: Four times a day (QID) | INTRAMUSCULAR | Status: DC | PRN
  Administered 2015-09-18: 4 mg via INTRAVENOUS
  Filled 2015-09-17: qty 2

## 2015-09-17 MED ORDER — CALCIUM CARBONATE-VITAMIN D 500-200 MG-UNIT PO TABS
1.0000 | ORAL_TABLET | Freq: Every day | ORAL | Status: DC
  Administered 2015-09-18 – 2015-09-23 (×6): 1 via ORAL
  Filled 2015-09-17 (×6): qty 1

## 2015-09-17 MED ORDER — VITAMIN E 45 MG (100 UNIT) PO CAPS
200.0000 [IU] | ORAL_CAPSULE | Freq: Every day | ORAL | Status: DC
  Administered 2015-09-18 – 2015-09-22 (×5): 200 [IU] via ORAL
  Filled 2015-09-17 (×7): qty 2

## 2015-09-17 MED ORDER — ZOLPIDEM TARTRATE 5 MG PO TABS
5.0000 mg | ORAL_TABLET | Freq: Every evening | ORAL | Status: DC | PRN
  Administered 2015-09-17 – 2015-09-22 (×6): 5 mg via ORAL
  Filled 2015-09-17 (×6): qty 1

## 2015-09-17 MED ORDER — POTASSIUM CHLORIDE CRYS ER 20 MEQ PO TBCR
40.0000 meq | EXTENDED_RELEASE_TABLET | Freq: Once | ORAL | Status: AC
  Administered 2015-09-17: 40 meq via ORAL
  Filled 2015-09-17: qty 2

## 2015-09-17 MED ORDER — MOMETASONE FURO-FORMOTEROL FUM 100-5 MCG/ACT IN AERO
2.0000 | INHALATION_SPRAY | Freq: Two times a day (BID) | RESPIRATORY_TRACT | Status: DC
  Administered 2015-09-17 – 2015-09-23 (×12): 2 via RESPIRATORY_TRACT
  Filled 2015-09-17: qty 8.8

## 2015-09-17 MED ORDER — SODIUM CHLORIDE 0.9 % IV SOLN
INTRAVENOUS | Status: DC
  Administered 2015-09-17 – 2015-09-18 (×2): via INTRAVENOUS

## 2015-09-17 MED ORDER — ONDANSETRON HCL 4 MG PO TABS: 4.0000 mg | ORAL_TABLET | Freq: Four times a day (QID) | ORAL | Status: DC | PRN

## 2015-09-17 MED ORDER — DEXTROSE 5 % IV SOLN: 500.0000 mg | INTRAVENOUS | Status: DC

## 2015-09-17 MED ORDER — LEVOFLOXACIN IN D5W 750 MG/150ML IV SOLN
750.0000 mg | Freq: Once | INTRAVENOUS | Status: AC
  Administered 2015-09-17: 750 mg via INTRAVENOUS
  Filled 2015-09-17: qty 150

## 2015-09-17 MED ORDER — SODIUM CHLORIDE 0.9% FLUSH
3.0000 mL | Freq: Two times a day (BID) | INTRAVENOUS | Status: DC
  Administered 2015-09-17 – 2015-09-23 (×12): 3 mL via INTRAVENOUS

## 2015-09-17 MED ORDER — GABAPENTIN 400 MG PO CAPS
400.0000 mg | ORAL_CAPSULE | Freq: Every day | ORAL | Status: DC
  Administered 2015-09-17 – 2015-09-22 (×6): 400 mg via ORAL
  Filled 2015-09-17 (×6): qty 1

## 2015-09-17 NOTE — Telephone Encounter (Signed)
Xray shows pneumonia. She needs to go ahead and go to ER and will probably need to start a dose of IV antibiotics. She may need to stay there depending on how quickly she improves.

## 2015-09-17 NOTE — ED Notes (Signed)
Pt called out to RN for extreme burning sensation at IV site of azythromycin, RN slowed infusion. Pt then called out again for discomfort, RN stopped infusion and notified MD. Pt IV site has no signs of infiltration or reaction. Will continue to monitor pt

## 2015-09-17 NOTE — ED Notes (Signed)
States had sore throat last week, began fevers, saw MD and today was told chest xray revealed pneumonia.

## 2015-09-17 NOTE — Progress Notes (Signed)
ANTIBIOTIC CONSULT NOTE - INITIAL  Pharmacy Consult for Levofloxacin  Indication: pneumonia  Allergies  Allergen Reactions  . Shellfish Allergy Anaphylaxis  . Azithromycin Other (See Comments)    Extreme burning sensation at IV site  . Tamiflu [Oseltamivir Phosphate] Other (See Comments)    Reaction:  Hallucinations     Patient Measurements: Height: 5\' 5"  (165.1 cm) Weight: 145 lb (65.772 kg) IBW/kg (Calculated) : 57 Adjusted Body Weight:   Vital Signs: Temp: 98.5 F (36.9 C) (01/30 1353) Temp Source: Oral (01/30 1353) BP: 133/64 mmHg (01/30 1630) Pulse Rate: 104 (01/30 1630) Intake/Output from previous day:   Intake/Output from this shift:    Labs:  Recent Labs  09/17/15 1432  WBC 21.2*  HGB 13.6  PLT 215  CREATININE 1.00   Estimated Creatinine Clearance: 39.7 mL/min (by C-G formula based on Cr of 1). No results for input(s): VANCOTROUGH, VANCOPEAK, VANCORANDOM, GENTTROUGH, GENTPEAK, GENTRANDOM, TOBRATROUGH, TOBRAPEAK, TOBRARND, AMIKACINPEAK, AMIKACINTROU, AMIKACIN in the last 72 hours.   Microbiology: No results found for this or any previous visit (from the past 720 hour(s)).  Medical History: Past Medical History  Diagnosis Date  . Pneumonia   . Hypertension   . PVC (premature ventricular contraction)   . COPD (chronic obstructive pulmonary disease) ([place])   . Asthma   . Peripheral neuropathy ([facility])     in both feet    Medications:   (Not in a hospital admission) Assessment: CrCl = 39.7 ml/min   Goal of Therapy:  resolution of infection   Plan:  Expected duration 7 days with resolution of temperature and/or normalization of WBC   Levaquin 750 mg IV X 1 given on 1/30. Levaquin 250 mg IV Q24H ordered to start 1/31 @ 18:00.   Khadijatou Borak D 09/17/2015,5:15 PM

## 2015-09-17 NOTE — Progress Notes (Signed)
*  PRELIMINARY RESULTS* Echocardiogram 2D Echocardiogram has been performed.  Molly Hodges 09/17/2015, 8:31 PM

## 2015-09-17 NOTE — Telephone Encounter (Signed)
Patient's husband Molly Hodges called office stating that pt has a fever of 103F and is talking "out of her head". Molly Hodges wants to know what he should do. Molly Hodges gave pt 2 tylenol for fever, also he said that they had just had x-ray done.

## 2015-09-17 NOTE — Progress Notes (Signed)
Pharmacist - Prescriber Communication  Per [facility] P&T Policy, the order for diphenhydramine 25 mg po at bedtime PRN sleep has been changed to zolpidem 5 mg po at bedtime PRN sleep. Please call pharmacy if you have any questions about this substitution.  Ariyah Sedlack A. [place], [place].D., BCPS Clinical Pharmacist 09/17/2015 [id number]

## 2015-09-17 NOTE — Telephone Encounter (Signed)
Patient's husband [name] was notified. Howard expressed understanding.

## 2015-09-17 NOTE — Progress Notes (Signed)
82 yo wf admitted to room 231 via stretcher from ED with Sepsis.  A&O x4, no distress on ra, dyspnic on exertion.  Cardiac monitor applied and verified by Levester Fresh, RN.  Pt denies chest pain.  Lungs diminished lower lobes bil.  Skin intact, checked with Levester Fresh, RN.  SL rt/lt wrist in place.  Oriented to room and surroundings.  Family at bedside.  Denies need at this time.  CB in reach, SR up x 2.

## 2015-09-17 NOTE — H&P (Signed)
[facility] [place] [facility] NAME: Molly Hodges    MR#:  [id number]  DATE OF BIRTH:  1932-02-02  DATE OF ADMISSION:  09/17/2015  PRIMARY CARE PHYSICIAN: Molly Huh, MD   REQUESTING/REFERRING PHYSICIAN: Dr. Amedeo Plenty  CHIEF COMPLAINT:   Chief Complaint  Patient presents with  . Fever    HISTORY OF PRESENT ILLNESS:  Molly Hodges  is a 82 y.o. female with a known history of hypertension, COPD not on home oxygen and prior history of pneumonia presents to the hospital secondary to fevers chills and worsening right-sided chest pain. Patient has been having upper respiratory tract infection symptoms with cough and sinus congestion for almost a month now. She has been recovering lately. For 4 days now she hasn't been feeling well, feeling weak and nauseated. For the last 2 days she has been having temperatures as high as 102F at home. This morning she went to see her PCP, and the office she was having significant chills temperature of 10 11F worsening right-sided chest pains or presented to the emergency room. Chest x-ray here shows right sided pneumonia. She has an elevated white count and was tachypneic and also tachycardic. So patient is being admitted for sepsis. Recent has had a history of paroxysmal atrial fibrillation in the past but hasn't had any trouble with that. She stays in normal sinus rhythm according to her but today she was noted to be in A. fib with rapid ventricular response. Denies any chest pain other than the right-sided chest pain. No palpitations. Feels short of breath and complains of significant chills. She has nauseated with decreased appetite and one episode of vomiting today.  PAST MEDICAL HISTORY:   Past Medical History  Diagnosis Date  . Pneumonia   . Hypertension   . PVC (premature ventricular contraction)   . COPD (chronic obstructive pulmonary disease) ([place])   . Asthma   . Peripheral neuropathy  ([place])     in both feet    PAST SURGICAL HISTORY:   Past Surgical History  Procedure Laterality Date  . Abdominal hysterectomy  1987    due to heavy bleeding  . Appendectomy    . Tonsillectomy    . Cataract extraction      SOCIAL HISTORY:   Social History  Substance Use Topics  . Smoking status: Never Smoker   . Smokeless tobacco: Never Used  . Alcohol Use: No    FAMILY HISTORY:   Family History  Problem Relation Age of Onset  . CAD Mother   . CAD Father     DRUG ALLERGIES:   Allergies  Allergen Reactions  . Shellfish Allergy Anaphylaxis  . Azithromycin Other (See Comments)    Extreme burning sensation at IV site  . Tamiflu [Oseltamivir Phosphate] Other (See Comments)    Reaction:  Hallucinations     REVIEW OF SYSTEMS:   Review of Systems  Constitutional: Negative for fever, chills, weight loss and malaise/fatigue.  HENT: Negative for ear discharge, ear pain, hearing loss and nosebleeds.   Eyes: Negative for blurred vision, double vision and photophobia.  Respiratory: Positive for cough and shortness of breath. Negative for hemoptysis and wheezing.   Cardiovascular: Positive for chest pain. Negative for palpitations, orthopnea and leg swelling.  Gastrointestinal: Positive for nausea and vomiting. Negative for heartburn, abdominal pain, diarrhea, constipation and melena.  Genitourinary: Negative for dysuria, urgency, frequency and hematuria.  Musculoskeletal: Negative for myalgias, back pain and neck pain.  Skin: Negative for  rash.  Neurological: Positive for headaches. Negative for dizziness, tremors, sensory change, speech change and focal weakness.  Endo/Heme/Allergies: Does not bruise/bleed easily.  Psychiatric/Behavioral: Negative for depression.    MEDICATIONS AT HOME:   Prior to Admission medications   Medication Sig Start Date End Date Taking? Authorizing Provider  Calcium Carbonate-Vitamin D (CALCIUM 600+D) 600-400 MG-UNIT tablet Take 1 tablet by  mouth daily.   Yes Historical Provider, MD  cholecalciferol (VITAMIN D) 1000 UNITS tablet Take 1,000 Units by mouth daily.   Yes Historical Provider, MD  dorzolamide (TRUSOPT) 2 % ophthalmic solution Place 1 drop into both eyes 2 (two) times daily.    Yes Historical Provider, MD  Fluticasone-Salmeterol (ADVAIR DISKUS) 250-50 MCG/DOSE AEPB Inhale 1 puff into the lungs 2 (two) times daily.   Yes Historical Provider, MD  gabapentin (NEURONTIN) 100 MG capsule Take 400 mg by mouth at bedtime.   Yes Historical Provider, MD  hydrochlorothiazide (MICROZIDE) 12.5 MG capsule Take 12.5 mg by mouth daily.   Yes Historical Provider, MD  latanoprost (XALATAN) 0.005 % ophthalmic solution Place 1 drop into both eyes at bedtime.    Yes Historical Provider, MD  magnesium oxide (MAG-OX) 400 MG tablet Take 400 mg by mouth daily.   Yes Historical Provider, MD  Omega-3 Fatty Acids (FISH OIL BURP-LESS) 1000 MG CAPS Take 1,000 mg by mouth daily.    Yes Historical Provider, MD  vitamin C (ASCORBIC ACID) 500 MG tablet Take 1,000 mg by mouth daily.   Yes Historical Provider, MD  vitamin E 100 UNIT capsule Take 200 Units by mouth daily.   Yes Historical Provider, MD      VITAL SIGNS:  Blood pressure 133/64, pulse 104, temperature 98.5 F (36.9 C), temperature source Oral, resp. rate 15, height 5\' 5"  (1.651 m), weight 65.772 kg (145 lb), SpO2 97 %.  PHYSICAL EXAMINATION:   Physical Exam  GENERAL:  82 y.o.-year-old patient lying in the bed with no acute distress.  EYES: Pupils equal, round, reactive to light and accommodation. No scleral icterus. Extraocular muscles intact.  HEENT: Head atraumatic, normocephalic. Oropharynx and nasopharynx clear.  NECK:  Supple, no jugular venous distention. No thyroid enlargement, no tenderness.  LUNGS: Normal breath sounds bilaterally, no wheezing, rales,rhonchi or crepitation. No use of accessory muscles of respiration.  CARDIOVASCULAR: S1, S2 normal. No murmurs, rubs, or gallops.   ABDOMEN: Soft, nontender, nondistended. Bowel sounds present. No organomegaly or mass.  EXTREMITIES: No pedal edema, cyanosis, or clubbing.  NEUROLOGIC: Cranial nerves II through XII are intact. Muscle strength 5/5 in all extremities. Sensation intact. Gait not checked.  PSYCHIATRIC: The patient is alert and oriented x 3.  SKIN: No obvious rash, lesion, or ulcer.   LABORATORY PANEL:   CBC  Recent Labs Lab 09/17/15 1432  WBC 21.2*  HGB 13.6  HCT 41.0  PLT 215   ------------------------------------------------------------------------------------------------------------------  Chemistries   Recent Labs Lab 09/17/15 1432  NA 134*  K 3.4*  CL 103  CO2 21*  GLUCOSE 160*  BUN 13  CREATININE 1.00  CALCIUM 8.4*  AST 20  ALT 20  ALKPHOS 70  BILITOT 1.0   ------------------------------------------------------------------------------------------------------------------  Cardiac Enzymes  Recent Labs Lab 09/17/15 1432  TROPONINI 0.04*   ------------------------------------------------------------------------------------------------------------------  RADIOLOGY:  Dg Chest 2 View  09/17/2015  CLINICAL DATA:  Two weeks of nonproductive cough, 2 days of fever and chills, history of asthma, nonsmoker. EXAM: CHEST  2 VIEW COMPARISON:  Chest x-ray of May 23, 2015 FINDINGS: There is new infiltrate in the  right lower lobe anteriorly. The left lung is clear. There is a stable subcentimeter nodule in the left lower lobe. The heart and pulmonary vascularity are normal. The mediastinum is normal in width. The bony thorax exhibits no acute abnormality. IMPRESSION: New anterior right lower lobe pneumonia. Underlying reactive airway disease. Followup PA and lateral chest X-ray is recommended in 3-4 weeks following trial of antibiotic therapy to ensure resolution and exclude underlying malignancy. Electronically Signed   By: David  Martinique M.D.   On: 09/17/2015 12:33    EKG:   Orders  placed or performed during the hospital encounter of 09/17/15  . ED EKG 12-Lead  . ED EKG 12-Lead  . ED EKG  . ED EKG  . EKG 12-Lead  . EKG 12-Lead    IMPRESSION AND PLAN:   Valeska Courtade  is a 82 y.o. female with a known history of hypertension, COPD not on home oxygen and prior history of pneumonia presents to the hospital secondary to fevers chills and worsening right-sided chest pain.  #1 sepsis- blood cultures ordered - CXR with right sided infiltrate.   - couldn't tolerate azithromycin in ER. Started on Levaquin for now. -IV fluids. Blood pressure is stable at this time. Continue to monitor.  #2 atrial fibrillation-history of paroxysmal A. fib happened once, never had any further issues with that. -Likely triggered by hypokalemia and also sepsis now. -One dose of IV Cardizem ordered, see if she will convert. Admit to telemetry - cardiology consulted. Started on oral metoprolol. - ECHO  - recycle troponins  #3 hypertension-started on metoprolol to help control the heart rate. Hold the hydrochlorothiazide  #4 hypokalemia-being replaced Check Magnesium level as well  #5 COPD-stable now. Continue inhaler  #6 DVT prophylaxis-on Lovenox    All the records are reviewed and case discussed with ED provider. Management plans discussed with the patient, family and they are in agreement.  CODE STATUS: Full code  TOTAL TIME TAKING CARE OF THIS PATIENT: 50 minutes.    Ehtan Delfavero M.D on 09/17/2015 at 5:01 PM  Between 7am to 6pm - Pager - [phone]  After 6pm go to [email or web address] - password [email or web address]  Office  [phone]  CC: Primary care physician; Molly Huh, MD

## 2015-09-17 NOTE — Progress Notes (Signed)
ANTIBIOTIC CONSULT NOTE - INITIAL  Pharmacy Consult for Azithromycin/Ceftriaxone Indication: CAP  Allergies  Allergen Reactions  . Shellfish Allergy Anaphylaxis  . Tamiflu [Oseltamivir Phosphate] Other (See Comments)    Reaction:  Hallucinations     Patient Measurements: Height: 5\' 5"  (165.1 cm) Weight: 145 lb (65.772 kg) IBW/kg (Calculated) : 57   Vital Signs: Temp: 98.5 F (36.9 C) (01/30 1353) Temp Source: Oral (01/30 1353) BP: 113/48 mmHg (01/30 1403) Pulse Rate: 123 (01/30 1403) Intake/Output from previous day:   Intake/Output from this shift:    Labs:  Recent Labs  09/17/15 1432  WBC 21.2*  HGB 13.6  PLT 215   CrCl cannot be calculated (Patient has no serum creatinine result on file.). No results for input(s): VANCOTROUGH, VANCOPEAK, VANCORANDOM, GENTTROUGH, GENTPEAK, GENTRANDOM, TOBRATROUGH, TOBRAPEAK, TOBRARND, AMIKACINPEAK, AMIKACINTROU, AMIKACIN in the last 72 hours.   Microbiology: No results found for this or any previous visit (from the past 720 hour(s)).  Medical History: Past Medical History  Diagnosis Date  . Pneumonia   . Hypertension     Assessment: Pharmacy consulted to dose Azithromycin and Ceftriaxone in an 82 yo female for treatment of CAP. BCx and UCx pending.  CMP pending, WBC: 21.2  Goal of Therapy:  Resolution of infection  Plan:  Patient has orders for Ceftriaxone 1 gm IV once and Azithromycin 500 mg IV once.  Will order Ceftriaxone 1 gm IV q24h and Azithromycin 500 mg IV q24h to start tomorrow.  No renal adjustments warranted for these medications.   Follow up culture results  Pharmacy will continue to follow.   Deshawnda Acrey G 09/17/2015,2:53 PM

## 2015-09-17 NOTE — Progress Notes (Signed)
Patient: Molly Hodges Female    DOB: 02/08/1932   82 y.o.   MRN: [id number] Visit Date: 09/17/2015  Today's Provider: Lelon Huh, MD   Chief Complaint  Patient presents with  . URI   Subjective:    URI  This is a new problem. Episode onset: 2 days ago. The problem has been rapidly worsening. The maximum temperature recorded prior to her arrival was 102 - 102.9 F (last night). Associated symptoms include headaches, nausea, a plugged ear sensation, rhinorrhea, a sore throat, swollen glands, vomiting and wheezing. Pertinent negatives include no abdominal pain, chest pain, congestion, dysuria or sneezing. She has tried acetaminophen for the symptoms. The treatment provided mild relief.       Allergies  Allergen Reactions  . Shellfish Allergy   . Tamiflu [Oseltamivir Phosphate]     Hallucination   Previous Medications   CHOLECALCIFEROL (VITAMIN D) 1000 UNITS TABLET    Take 1,000 Units by mouth daily.   DORZOLAMIDE (TRUSOPT) 2 % OPHTHALMIC SOLUTION    1 drop 2 (two) times daily.    FLUTICASONE-SALMETEROL (ADVAIR DISKUS) 250-50 MCG/DOSE AEPB    Inhale 1 puff into the lungs 2 (two) times daily.   GABAPENTIN (NEURONTIN) 300 MG CAPSULE    TAKE ONE CAPSULE BY MOUTH EVERY MORNING AND TWO CAPSULES AT BEDTIME   HYDROCHLOROTHIAZIDE (MICROZIDE) 12.5 MG CAPSULE    TAKE ONE (1) CAPSULE EACH DAY   LATANOPROST (XALATAN) 0.005 % OPHTHALMIC SOLUTION    1 drop at bedtime.   MAGNESIUM OXIDE (MAG-OX) 400 MG TABLET    Take 400 mg by mouth daily.   OMEGA-3 FATTY ACIDS (FISH OIL BURP-LESS) 1000 MG CAPS    Take 1 capsule by mouth daily.    Review of Systems  Constitutional: Positive for fever, chills, appetite change (no appetite) and fatigue.  HENT: Positive for rhinorrhea and sore throat. Negative for congestion and sneezing.   Respiratory: Positive for shortness of breath and wheezing. Negative for chest tightness.   Cardiovascular: Negative for chest pain and palpitations.    Gastrointestinal: Positive for nausea and vomiting. Negative for abdominal pain.  Genitourinary: Positive for frequency. Negative for dysuria.  Musculoskeletal: Positive for myalgias (right side pain).  Neurological: Positive for headaches. Negative for dizziness and weakness.    Social History  Substance Use Topics  . Smoking status: Never Smoker   . Smokeless tobacco: Never Used  . Alcohol Use: No   Objective:   BP 132/60 mmHg  Pulse 130  Temp(Src) 100.7 F (38.2 C) (Oral)  Resp 26  Wt 146 lb (66.225 kg)  SpO2 100%  Physical Exam   General Appearance:    Alert, cooperative, shivering  Eyes:    PERRL, conjunctiva/corneas clear, EOM's intact       Lungs:     Clear to auscultation bilaterally, respirations unlabored  Heart:    Regular rate and rhythm  Neurologic:   Awake, alert, oriented x 3. No apparent focal neurological           defect.      Dg Chest 2 View  09/17/2015   CLINICAL DATA:  Two weeks of nonproductive cough, 2 days of fever and chills, history of asthma, nonsmoker.  EXAM: CHEST  2 VIEW  COMPARISON:  Chest x-ray of May 23, 2015  FINDINGS: There is new infiltrate in the right lower lobe anteriorly. The left lung is clear. There is a stable subcentimeter nodule in the left lower lobe. The heart and pulmonary  vascularity are normal. The mediastinum is normal in width. The bony thorax exhibits no acute abnormality.  IMPRESSION: New anterior right lower lobe pneumonia. Underlying reactive airway disease. Followup PA and lateral chest X-ray is recommended in 3-4 weeks following trial of antibiotic therapy to ensure resolution and exclude underlying malignancy. Electronically Signed   By: David  Martinique M.D.   On: 09/17/2015 12:33   Results for orders placed or performed in visit on 09/17/15  POCT Influenza A/B  Result Value Ref Range   Influenza A, POC Negative Negative   Influenza B, POC Negative Negative        Assessment & Plan:     1. Fever and  chills  - POCT Influenza A/B - DG Chest 2 View; Future  2. Cough  - DG Chest 2 View; Future  3. Right lower lobe pneumonia After completing XR, patient's husband called stating that her temperature had gone up to 103 and that she is now talking out of her head, which is a significant change since she was in office. Advised her husband to take patient to ER for more urgent treatment of pneumonia and consideration for admission due to mental status changes.        Lelon Huh, MD  [facility]

## 2015-09-17 NOTE — ED Notes (Signed)
Received call at this time from lab with Troponin at critical value of 0.04.  Dr. Mariea Clonts notified.

## 2015-09-17 NOTE — ED Provider Notes (Signed)
[facility] Emergency Department Provider Note  ____________________________________________  Time seen: Approximately 2:28 PM  I have reviewed the triage vital signs and the nursing notes.   HISTORY  Chief Complaint Fever    HPI BREEONNA LARMON is a 82 y.o. female with a history of HTN and recurrent pneumonia sent from her PCPs office for pneumonia. Patient states that for the last 4 days she has had a nonproductive cough with fever and chills. Says associated with exertional dyspnea. She has also had decreased appetite and a single episode of vomiting several days ago. She has not had any urinary symptoms or diarrhea, no abdominal pain.  She has had associated right lateral chest wall pain that is worse with coughing.   Past Medical History  Diagnosis Date  . Pneumonia   . Hypertension     Patient Active Problem List   Diagnosis Date Noted  . Pneumonia 04/05/2015  . Premature ventricular contraction 03/26/2015  . Vitamin D deficiency 03/26/2015  . Neuropathy ([place]) 03/26/2015  . Hypertension 03/26/2015  . COPD (chronic obstructive pulmonary disease) ([place]) 03/26/2015  . Asthma 03/26/2015    Past Surgical History  Procedure Laterality Date  . Abdominal hysterectomy  1987    due to heavy bleeding    Current Outpatient Rx  Name  Route  Sig  Dispense  Refill  . Calcium Carbonate-Vitamin D (CALCIUM 600+D) 600-400 MG-UNIT tablet   Oral   Take 1 tablet by mouth daily.         . cholecalciferol (VITAMIN D) 1000 UNITS tablet   Oral   Take 1,000 Units by mouth daily.         . dorzolamide (TRUSOPT) 2 % ophthalmic solution   Both Eyes   Place 1 drop into both eyes 2 (two) times daily.          . Fluticasone-Salmeterol (ADVAIR DISKUS) 250-50 MCG/DOSE AEPB   Inhalation   Inhale 1 puff into the lungs 2 (two) times daily.         Marland Kitchen gabapentin (NEURONTIN) 100 MG capsule   Oral   Take 400 mg by mouth at bedtime.         .  hydrochlorothiazide (MICROZIDE) 12.5 MG capsule   Oral   Take 12.5 mg by mouth daily.         Marland Kitchen latanoprost (XALATAN) 0.005 % ophthalmic solution   Both Eyes   Place 1 drop into both eyes at bedtime.          . magnesium oxide (MAG-OX) 400 MG tablet   Oral   Take 400 mg by mouth daily.         . Omega-3 Fatty Acids (FISH OIL BURP-LESS) 1000 MG CAPS   Oral   Take 1,000 mg by mouth daily.          . vitamin C (ASCORBIC ACID) 500 MG tablet   Oral   Take 1,000 mg by mouth daily.         . vitamin E 100 UNIT capsule   Oral   Take 200 Units by mouth daily.           Allergies Shellfish allergy and Tamiflu  No family history on file.  Social History Social History  Substance Use Topics  . Smoking status: Never Smoker   . Smokeless tobacco: Never Used  . Alcohol Use: No    Review of Systems Constitutional: Positive fever and chills. No lightheadedness. No syncope. Positive decreased exercise tolerance. Eyes: No  visual changes. No eye discharge. ENT: Positive sore throat, now resolved. No congestion or rhinorrhea. Cardiovascular: Positive right lateral chest wall pain, no palpitations. Respiratory: Positive exertional shortness of breath.  Positive cough. Gastrointestinal: No abdominal pain.  Positive nausea, positive vomiting.  No diarrhea.  No constipation. Genitourinary: Negative for dysuria. Musculoskeletal: Negative for back pain. Skin: Negative for rash. Neurological: Negative for headaches, focal weakness or numbness.  10-point ROS otherwise negative.  ____________________________________________   PHYSICAL EXAM:  VITAL SIGNS: ED Triage Vitals  Enc Vitals Group     BP 09/17/15 1353 110/58 mmHg     Pulse Rate 09/17/15 1353 132     Resp 09/17/15 1353 20     Temp 09/17/15 1353 98.5 F (36.9 C)     Temp Source 09/17/15 1353 Oral     SpO2 09/17/15 1353 95 %     Weight 09/17/15 1353 145 lb (65.772 kg)     Height 09/17/15 1353 5\' 5"  (1.651 m)      Head Cir --      Peak Flow --      Pain Score 09/17/15 1354 5     Pain Loc --      Pain Edu? --      Excl. in [place]? --     Constitutional: Alert and oriented. Well appearing and in no acute distress. Answer question appropriately. Eyes: Conjunctivae are normal.  EOMI. no eye discharge. Head: Atraumatic. Nose: No congestion/rhinnorhea. Mouth/Throat: Mucous membranes are moist.  Neck: No stridor.  Supple.  No JVD. Cardiovascular: Normal rate, regular rhythm. No murmurs, rubs or gallops.  Respiratory: Patient is mildly tachypnea without accessory muscle use or retractions. She has rales in the bases bilaterally. No wheezes or rhonchi. Able to speak in full sentences. No acute respiratory distress.  Gastrointestinal: Soft and nontender. No distention. No peritoneal signs. Musculoskeletal: No LE edema. No palpable cords or tenderness to palpation in the calves. Negative Homans sign. Neurologic:  Normal speech and language. No gross focal neurologic deficits are appreciated.  Skin:  Skin is warm, dry and intact. No rash noted. Psychiatric: Mood and affect are normal. Speech and behavior are normal.  Normal judgement.  ____________________________________________   LABS (all labs ordered are listed, but only abnormal results are displayed)  Labs Reviewed  COMPREHENSIVE METABOLIC PANEL - Abnormal; Notable for the following:    Sodium 134 (*)    Potassium 3.4 (*)    CO2 21 (*)    Glucose, Bld 160 (*)    Calcium 8.4 (*)    GFR calc non Af Amer 51 (*)    GFR calc Af Amer 60 (*)    All other components within normal limits  CBC WITH DIFFERENTIAL/PLATELET - Abnormal; Notable for the following:    WBC 21.2 (*)    Neutro Abs 18.7 (*)    Lymphs Abs 0.9 (*)    Monocytes Absolute 1.6 (*)    All other components within normal limits  TROPONIN I - Abnormal; Notable for the following:    Troponin I 0.04 (*)    All other components within normal limits  BLOOD GAS, VENOUS - Abnormal; Notable  for the following:    pCO2, Ven 41 (*)    All other components within normal limits  CULTURE, BLOOD (ROUTINE X 2)  CULTURE, BLOOD (ROUTINE X 2)  URINE CULTURE  LACTIC ACID, PLASMA  LACTIC ACID, PLASMA  URINALYSIS COMPLETEWITH MICROSCOPIC ([facility] ONLY)   ____________________________________________  EKG  ED ECG REPORT I, Mariea Clonts, Anne-Caroline, the  attending physician, personally viewed and interpreted this ECG.   Date: 09/17/2015  EKG Time: 1437  Rate: 123  Rhythm: Irregular rhythm which is undetermined, there are some P waves, but also poor baseline and possible a flutter.  Axis: Normal  Intervals:none  ST&T Change: No ST elevation.  Plan to reevaluate EKG.  ----------------------------------------- 3:54 PM on 09/17/2015 -----------------------------------------  ED ECG REPORT I, Eula Listen, the attending physician, personally viewed and interpreted this ECG.   Date: 09/17/2015  EKG Time: 1523  Rate: 105  Rhythm: atrial fibrillation  Axis: Normal  Intervals:none  ST&T Change: No ST elevation.  The patient does not have a history of atrial fibrillation.   ____________________________________________  RADIOLOGY  Dg Chest 2 View  09/17/2015  CLINICAL DATA:  Two weeks of nonproductive cough, 2 days of fever and chills, history of asthma, nonsmoker. EXAM: CHEST  2 VIEW COMPARISON:  Chest x-ray of May 23, 2015 FINDINGS: There is new infiltrate in the right lower lobe anteriorly. The left lung is clear. There is a stable subcentimeter nodule in the left lower lobe. The heart and pulmonary vascularity are normal. The mediastinum is normal in width. The bony thorax exhibits no acute abnormality. IMPRESSION: New anterior right lower lobe pneumonia. Underlying reactive airway disease. Followup PA and lateral chest X-ray is recommended in 3-4 weeks following trial of antibiotic therapy to ensure resolution and exclude underlying malignancy. Electronically Signed    By: David  Martinique M.D.   On: 09/17/2015 12:33    ____________________________________________   PROCEDURES  Procedure(s) performed: None  Critical Care performed: No ____________________________________________   INITIAL IMPRESSION / ASSESSMENT AND PLAN / ED COURSE  Pertinent labs & imaging results that were available during my care of the patient were reviewed by me and considered in my medical decision making (see chart for details).  82 y.o. female presenting with cough and fever, chest x-ray positive for pneumonia with signs of sepsis. Overall, the patient is not in respiratory distress, but I am concerned about her tachycardia and tachypnea. We will initiate aggressive fluid management as well as immediate. Antibiotics. She will be admitted to the hospital.  ----------------------------------------- 3:51 PM on 09/17/2015 -----------------------------------------  After intravenous fluids, the patient's heart rate is improving and she continues to maintain her blood pressure. However, she does have new onset atrial fibrillation and will be admitted to the hospital for her pneumonia and atrial fibrillation. She had a burning sensation with the azithromycin given by IV, so we have discontinued this and will give her Levaquin instead. Her QTC is 472 and this will be monitored and she is on a cardiac monitor. The patient has a troponin of 0.04, with possible strain from her pneumonia but otherwise no likely acute cardiac problems. This will be trended by the hospitalist. I will plan to admit her to the hospital. ____________________________________________  FINAL CLINICAL IMPRESSION(S) / ED DIAGNOSES  Final diagnoses:  Community acquired pneumonia  Sepsis, due to unspecified organism ([place])  Elevated troponin  Medication side effect, initial encounter      NEW MEDICATIONS STARTED DURING THIS VISIT:  New Prescriptions   No medications on file     Eula Listen,  MD 09/17/15 1556

## 2015-09-18 ENCOUNTER — Inpatient Hospital Stay: Payer: Medicare Other

## 2015-09-18 ENCOUNTER — Encounter: Payer: Self-pay | Admitting: Physician Assistant

## 2015-09-18 DIAGNOSIS — R7989 Other specified abnormal findings of blood chemistry: Secondary | ICD-10-CM

## 2015-09-18 DIAGNOSIS — R0902 Hypoxemia: Secondary | ICD-10-CM | POA: Insufficient documentation

## 2015-09-18 DIAGNOSIS — A419 Sepsis, unspecified organism: Principal | ICD-10-CM

## 2015-09-18 DIAGNOSIS — Z7189 Other specified counseling: Secondary | ICD-10-CM

## 2015-09-18 DIAGNOSIS — J189 Pneumonia, unspecified organism: Secondary | ICD-10-CM

## 2015-09-18 DIAGNOSIS — I4891 Unspecified atrial fibrillation: Secondary | ICD-10-CM

## 2015-09-18 DIAGNOSIS — I4819 Other persistent atrial fibrillation: Secondary | ICD-10-CM | POA: Insufficient documentation

## 2015-09-18 DIAGNOSIS — R778 Other specified abnormalities of plasma proteins: Secondary | ICD-10-CM | POA: Insufficient documentation

## 2015-09-18 LAB — BASIC METABOLIC PANEL
Anion gap: 6 (ref 5–15)
BUN: 11 mg/dL (ref 6–20)
CO2: 20 mmol/L — ABNORMAL LOW (ref 22–32)
Calcium: 7.1 mg/dL — ABNORMAL LOW (ref 8.9–10.3)
Chloride: 107 mmol/L (ref 101–111)
Creatinine, Ser: 0.82 mg/dL (ref 0.44–1.00)
GFR calc Af Amer: >60 mL/min (ref >60)
GFR calc non Af Amer: >60 mL/min (ref >60)
Glucose, Bld: 134 mg/dL — ABNORMAL HIGH (ref 65–99)
Potassium: 4 mmol/L (ref 3.5–5.1)
Sodium: 133 mmol/L — ABNORMAL LOW (ref 135–145)

## 2015-09-18 LAB — CBC
HCT: 37.6 % (ref 35.0–47.0)
Hemoglobin: 12.5 g/dL (ref 12.0–16.0)
MCH: 28.6 pg (ref 26.0–34.0)
MCHC: 33.1 g/dL (ref 32.0–36.0)
MCV: 86.2 fL (ref 80.0–100.0)
Platelets: 178 10*3/uL (ref 150–440)
RBC: 4.37 MIL/uL (ref 3.80–5.20)
RDW: 13.9 % (ref 11.5–14.5)
WBC: 17.4 10*3/uL — ABNORMAL HIGH (ref 3.6–11.0)

## 2015-09-18 LAB — TROPONIN I
Troponin I: 0.2 ng/mL — ABNORMAL HIGH (ref <0.031)
Troponin I: 0.05 ng/mL — ABNORMAL HIGH (ref <0.031)

## 2015-09-18 MED ORDER — MORPHINE SULFATE (PF) 2 MG/ML IV SOLN
1.0000 mg | INTRAVENOUS | Status: DC | PRN
  Administered 2015-09-18: 1 mg via INTRAVENOUS
  Filled 2015-09-18: qty 1

## 2015-09-18 MED ORDER — IPRATROPIUM-ALBUTEROL 0.5-2.5 (3) MG/3ML IN SOLN
3.0000 mL | RESPIRATORY_TRACT | Status: DC
  Administered 2015-09-18 – 2015-09-19 (×6): 3 mL via RESPIRATORY_TRACT
  Filled 2015-09-18 (×7): qty 3

## 2015-09-18 MED ORDER — HYDROCOD POLST-CPM POLST ER 10-8 MG/5ML PO SUER
5.0000 mL | Freq: Two times a day (BID) | ORAL | Status: DC | PRN
  Administered 2015-09-18 – 2015-09-22 (×6): 5 mL via ORAL
  Filled 2015-09-18 (×6): qty 5

## 2015-09-18 MED ORDER — METHYLPREDNISOLONE SODIUM SUCC 125 MG IJ SOLR
125.0000 mg | INTRAMUSCULAR | Status: AC
  Administered 2015-09-18: 125 mg via INTRAVENOUS
  Filled 2015-09-18: qty 2

## 2015-09-18 MED ORDER — METHYLPREDNISOLONE SODIUM SUCC 125 MG IJ SOLR
60.0000 mg | INTRAMUSCULAR | Status: DC
  Administered 2015-09-18 – 2015-09-20 (×2): 60 mg via INTRAVENOUS
  Filled 2015-09-18 (×2): qty 2

## 2015-09-18 MED ORDER — LEVOFLOXACIN IN D5W 500 MG/100ML IV SOLN
500.0000 mg | INTRAVENOUS | Status: DC
  Administered 2015-09-18 – 2015-09-21 (×4): 500 mg via INTRAVENOUS
  Filled 2015-09-18 (×6): qty 100

## 2015-09-18 MED ORDER — APIXABAN 5 MG PO TABS
5.0000 mg | ORAL_TABLET | Freq: Two times a day (BID) | ORAL | Status: DC
  Administered 2015-09-18 – 2015-09-23 (×11): 5 mg via ORAL
  Filled 2015-09-18 (×11): qty 1

## 2015-09-18 MED ORDER — DILTIAZEM HCL ER COATED BEADS 120 MG PO CP24
120.0000 mg | ORAL_CAPSULE | Freq: Every day | ORAL | Status: DC
  Administered 2015-09-18 – 2015-09-19 (×2): 120 mg via ORAL
  Filled 2015-09-18 (×2): qty 1

## 2015-09-18 MED ORDER — TRAMADOL HCL 50 MG PO TABS
50.0000 mg | ORAL_TABLET | Freq: Four times a day (QID) | ORAL | Status: DC | PRN
Start: 2015-09-18 — End: 2015-09-23
  Administered 2015-09-18: 50 mg via ORAL
  Filled 2015-09-18: qty 1

## 2015-09-18 MED ORDER — FUROSEMIDE 10 MG/ML IJ SOLN
20.0000 mg | Freq: Once | INTRAMUSCULAR | Status: AC
  Administered 2015-09-18: 20 mg via INTRAVENOUS
  Filled 2015-09-18: qty 2

## 2015-09-18 NOTE — Progress Notes (Signed)
[facility] [place] NAME: Molly Hodges    MR#:  [id number]  DATE OF BIRTH:  16-Dec-1931  SUBJECTIVE:  CHIEF COMPLAINT:   Chief Complaint  Patient presents with  . Fever   SOB is worse with wheezing. Right pleuritic chest pain Tele shows Afib  REVIEW OF SYSTEMS:    Review of Systems  Constitutional: Positive for malaise/fatigue. Negative for fever and chills.  HENT: Negative for sore throat.   Eyes: Negative for blurred vision, double vision and pain.  Respiratory: Positive for cough, shortness of breath and wheezing. Negative for hemoptysis.   Cardiovascular: Positive for chest pain. Negative for palpitations, orthopnea and leg swelling.  Gastrointestinal: Negative for heartburn, nausea, vomiting, abdominal pain, diarrhea and constipation.  Genitourinary: Negative for dysuria and hematuria.  Musculoskeletal: Negative for back pain and joint pain.  Skin: Negative for rash.  Neurological: Positive for weakness. Negative for sensory change, speech change, focal weakness and headaches.  Endo/Heme/Allergies: Does not bruise/bleed easily.  Psychiatric/Behavioral: Negative for depression. The patient is nervous/anxious.       DRUG ALLERGIES:   Allergies  Allergen Reactions  . Shellfish Allergy Anaphylaxis  . Azithromycin Other (See Comments)    Extreme burning sensation at IV site  . Tamiflu [Oseltamivir Phosphate] Other (See Comments)    Reaction:  Hallucinations     VITALS:  Blood pressure 149/87, pulse 114, temperature 98.2 F (36.8 C), temperature source Oral, resp. rate 26, height 5\' 5"  (1.651 m), weight 71.895 kg (158 lb 8 oz), SpO2 96 %.  PHYSICAL EXAMINATION:   Physical Exam  GENERAL:  82 y.o.-year-old patient lying in the bed with  Acute resp distress.  EYES: Pupils equal, round, reactive to light and accommodation. No scleral icterus. Extraocular muscles intact.  HEENT: Head atraumatic, normocephalic.  Oropharynx and nasopharynx clear.  NECK:  Supple, no jugular venous distention. No thyroid enlargement, no tenderness.  LUNGS: Decreased breath sounds. Wheezing. CARDIOVASCULAR: S1, S2 normal. No murmurs, rubs, or gallops.  ABDOMEN: Soft, nontender, nondistended. Bowel sounds present. No organomegaly or mass.  EXTREMITIES: No cyanosis, clubbing or edema b/l.  NEUROLOGIC: Cranial nerves II through XII are intact. No focal Motor or sensory deficits b/l.   PSYCHIATRIC: The patient is alert and oriented x 3. Anxious SKIN: No obvious rash, lesion, or ulcer.    LABORATORY PANEL:   CBC  Recent Labs Lab 09/18/15 0603  WBC 17.4*  HGB 12.5  HCT 37.6  PLT 178   ------------------------------------------------------------------------------------------------------------------  Chemistries   Recent Labs Lab 09/17/15 1432 09/18/15 0603  NA 134* 133*  K 3.4* 4.0  CL 103 107  CO2 21* 20*  GLUCOSE 160* 134*  BUN 13 11  CREATININE 1.00 0.82  CALCIUM 8.4* 7.1*  AST 20  --   ALT 20  --   ALKPHOS 70  --   BILITOT 1.0  --    ------------------------------------------------------------------------------------------------------------------  Cardiac Enzymes  Recent Labs Lab 09/18/15 0603  TROPONINI 0.05*   ------------------------------------------------------------------------------------------------------------------  RADIOLOGY:  Dg Chest 2 View  09/17/2015  CLINICAL DATA:  Two weeks of nonproductive cough, 2 days of fever and chills, history of asthma, nonsmoker. EXAM: CHEST  2 VIEW COMPARISON:  Chest x-ray of May 23, 2015 FINDINGS: There is new infiltrate in the right lower lobe anteriorly. The left lung is clear. There is a stable subcentimeter nodule in the left lower lobe. The heart and pulmonary vascularity are normal. The mediastinum is normal in width. The bony thorax exhibits no  acute abnormality. IMPRESSION: New anterior right lower lobe pneumonia. Underlying reactive  airway disease. Followup PA and lateral chest X-ray is recommended in 3-4 weeks following trial of antibiotic therapy to ensure resolution and exclude underlying malignancy. Electronically Signed   By: David  Martinique M.D.   On: 09/17/2015 12:33   Ct Chest Wo Contrast  09/17/2015  CLINICAL DATA:  Fevers.  Pneumonia. EXAM: CT CHEST WITHOUT CONTRAST TECHNIQUE: Multidetector CT imaging of the chest was performed following the standard protocol without IV contrast. COMPARISON:  Plain film of earlier today and 05/23/2015. No prior CT. FINDINGS: Mediastinum/Nodes: Multiple small left low jugular nodes are nonspecific. Aortic and branch vessel atherosclerosis. Mild cardiomegaly, without pericardial effusion. LAD coronary artery atherosclerosis. Middle and anterior mediastinal adenopathy. A right paratracheal node measures 1.4 cm on image 26/series 2. A node within the azygoesophageal recess measures 2.6 x 1.8 cm on image 32/series 2. A more inferior node within azygoesophageal recess measures 2.0 x 2.9 cm on image 35/series 2. Right hilar adenopathy at 1.7 cm. Prevascular nodes measure up to 11 mm on image 24/series 2. Lungs/Pleura: Small right pleural effusion. Patchy right lower lobe ground-glass opacities. Right lower lobe pleural-based dense consolidation with surrounding ground-glass opacity. Minimal peripheral cavitation within this area. Example image 44/series 3. Minimal posterior left upper lobe nodularity, including at 4 mm on image 30/series 3. Upper abdomen: Mild hepatic steatosis. Normal imaged portions of the spleen, stomach, pancreas, gallbladder, adrenal glands, kidneys. Musculoskeletal: Mild osteopenia. IMPRESSION: 1. Pleural based right lower lobe consolidation with surrounding ground-glass opacity and minimal cavitation. Given the clinical history, most likely indicative of pneumonia. Given the morphology and position, pulmonary infarct could have a similar appearance. 2. Small right pleural effusion.  3. Significant thoracic adenopathy. Although this could be reactive, given the severity, lymphoproliferative process or metastatic disease would be secondary concerns. This warrants followup with chest CT at 3 months to confirm resolution. 4.  Atherosclerosis, including within the coronary arteries. 5. Hepatic steatosis. 6. 4 mm left upper lobe pulmonary nodule. If the patient is at high risk for bronchogenic carcinoma, follow-up chest CT at 1 year is recommended. If the patient is at low risk, no follow-up is needed. This recommendation follows the consensus statement: "Guidelines for Management of Small Pulmonary Nodules Detected on CT Scans: A Statement from the [place]" as published in Radiology 2005; 237:395-400. Available online at: https://www.arnold.com/. Electronically Signed   By: Abigail Miyamoto M.D.   On: 09/17/2015 18:18     ASSESSMENT AND PLAN:   Molly Hodges is a 82 y.o. female with a known history of hypertension, COPD not on home oxygen and prior history of pneumonia presents to the hospital secondary to fevers chills and worsening right-sided chest pain.  # RLL PNA with COPD exacerbation and Acute hypoxic respiratory failure and sepsis Worseing. More SOB and wheezing. STAT Steroids, nebs -IV steroids, Antibiotics - Scheduled Nebulizers - Inhalers -Wean O2 as tolerated - Consult pulmonary if no improvement - Repeat CXR today.  # Atrial fibrillation-history of paroxysmal A. fib happened once, never had any further issues with that. Rate controlled -Likely triggered sepsis now. - PO Cardizem - cardiology consulted.  - ECHO  - Minimal elevation of troponin  # hypertension Hold the hydrochlorothiazide On PO cardizem now  # hypokalemia- replaced  # DVT prophylaxis-on Lovenox  All the records are reviewed and case discussed with Care Management/Social Workerr. Management plans discussed with the patient, family and they are  in agreement.  CODE STATUS: FULL  DVT  Prophylaxis: SCDs  TOTAL CC TIME TAKING CARE OF THIS PATIENT: 35 minutes.    Hillary Bow R M.D on 09/18/2015 at 1:31 PM  Between 7am to 6pm - Pager - [phone]  After 6pm go to [email or web address] - password [email or web address] [facility] [place]  Office  [phone]  CC: Primary care physician; Lelon Huh, MD    Note: This dictation was prepared with Dragon dictation along with smaller phrase technology. Any transcriptional errors that result from this process are unintentional.

## 2015-09-18 NOTE — Consult Note (Signed)
Cardiology Consultation Note  Patient ID: Molly Hodges, MRN: [id number], DOB/AGE: [birth year] 82 y.o. Admit date: 09/17/2015   Date of Consult: 09/18/2015 Primary Physician: Lelon Huh, MD Primary Cardiologist: New to [facility]  Chief Complaint: Cough and fever-->sepsis in the setting of CAP and UTI Reason for Consult: New onset Afib with RVR  HPI: 82 y.o. female with h/o COPD, asthma, prior episodes of PNA, palpitations with no prior formal diagnosis of Afib per patient, and HTN who presented to [facility] ED from PCP office with cough, fever and chills and was found to have RLL PNA and be in new onset Afib with RVR.   She does not have any previously known cardiac history. She does report a long history of palpitations, but does deny any formal diagnosis of Afib. She has been worked up for this is in the past per her report with uneventful outcomes. She has been dealing with intermittent "low-grade" fever since the spring and summer of 2016. She was originally diagnosed with PNA in August 2016, treated with ABX. Repeat CXR in October showed near total resolution along the RLL. She continued to have an intermittent pain along right lower chest wall. Repeat CXR was advised in 2-3 weeks from the October study, this was not obtained. She returned to PCP on 1/30 with worsening congestion, fever, and chills. No cough, weight gain, LEE, orthopnea, PNA, chest pain, or tachy-palpitations. Tmax on 1/28 of 103. Temp at PCP was 100.7. She was sent to the ED for further evaluation. Of note, her HR at her PCP was found to be 130, no ECG obtained.   Upon the patient's arrival to [facility] they were found to have a troponin of 0.04-->0.07-->0.20-->0.05, WBC 21.2-->17.4, K+ 3.4-->4.0, UA with 1+ LE, 2+ hgb, TNTC RBC/HPF, TNTC WBC/HPF, lactic acid 1.8, negative influenza A/B, VBG pCO2 of 41, blood cultures x 2 are pending. ECG showed Afib with RVR, 105 bpm, CXR showed right anterior lobe PNA. CT chest showed right lower  consolidation with surrounding ground-glass opacity and minimal cavitation felt to be PNA. Small right pleural effusion. She was started on Levaquin by IM. She received Lopressor 25 mg bid x 2 and diltiazem IV 5 mg x 1 for rate control overnight. Heart rate in the 90's this morning in Afib. She does not feel her Afib. She remains somewhat SOB still.   Past Medical History  Diagnosis Date  . Pneumonia   . Hypertension   . Palpitation   . COPD (chronic obstructive pulmonary disease) ([place])   . Asthma   . Peripheral neuropathy (HCC)     in both feet      Most Recent Cardiac Studies: Echo pending   Surgical History:  Past Surgical History  Procedure Laterality Date  . Abdominal hysterectomy  1987    due to heavy bleeding  . Appendectomy    . Tonsillectomy    . Cataract extraction       Home Meds: Prior to Admission medications   Medication Sig Start Date End Date Taking? Authorizing Provider  Calcium Carbonate-Vitamin D (CALCIUM 600+D) 600-400 MG-UNIT tablet Take 1 tablet by mouth daily.   Yes Historical Provider, MD  cholecalciferol (VITAMIN D) 1000 UNITS tablet Take 1,000 Units by mouth daily.   Yes Historical Provider, MD  dorzolamide (TRUSOPT) 2 % ophthalmic solution Place 1 drop into both eyes 2 (two) times daily.    Yes Historical Provider, MD  Fluticasone-Salmeterol (ADVAIR DISKUS) 250-50 MCG/DOSE AEPB Inhale 1 puff into the lungs 2 (two)  times daily.   Yes Historical Provider, MD  gabapentin (NEURONTIN) 100 MG capsule Take 400 mg by mouth at bedtime.   Yes Historical Provider, MD  hydrochlorothiazide (MICROZIDE) 12.5 MG capsule Take 12.5 mg by mouth daily.   Yes Historical Provider, MD  latanoprost (XALATAN) 0.005 % ophthalmic solution Place 1 drop into both eyes at bedtime.    Yes Historical Provider, MD  magnesium oxide (MAG-OX) 400 MG tablet Take 400 mg by mouth daily.   Yes Historical Provider, MD  Omega-3 Fatty Acids (FISH OIL BURP-LESS) 1000 MG CAPS Take 1,000 mg by  mouth daily.    Yes Historical Provider, MD  vitamin C (ASCORBIC ACID) 500 MG tablet Take 1,000 mg by mouth daily.   Yes Historical Provider, MD  vitamin E 100 UNIT capsule Take 200 Units by mouth daily.   Yes Historical Provider, MD    Inpatient Medications:  . apixaban  5 mg Oral BID  . calcium-vitamin D  1 tablet Oral Daily  . cholecalciferol  1,000 Units Oral Daily  . diltiazem  120 mg Oral Daily  . dorzolamide  1 drop Both Eyes BID  . gabapentin  400 mg Oral QHS  . latanoprost  1 drop Both Eyes QHS  . levofloxacin (LEVAQUIN) IV  250 mg Intravenous Q24H  . magnesium oxide  400 mg Oral Daily  . mometasone-formoterol  2 puff Inhalation BID  . omega-3 acid ethyl esters  1,000 mg Oral Daily  . sodium chloride flush  3 mL Intravenous Q12H  . vitamin C  1,000 mg Oral Daily  . vitamin E  200 Units Oral Daily   . sodium chloride 100 mL/hr at 09/18/15 0536    Allergies:  Allergies  Allergen Reactions  . Shellfish Allergy Anaphylaxis  . Azithromycin Other (See Comments)    Extreme burning sensation at IV site  . Tamiflu [Oseltamivir Phosphate] Other (See Comments)    Reaction:  Hallucinations     Social History   Social History  . Marital Status: Married    Spouse Name: N/A  . Number of Children: N/A  . Years of Education: N/A   Occupational History  . Not on file.   Social History Main Topics  . Smoking status: Never Smoker   . Smokeless tobacco: Never Used  . Alcohol Use: No  . Drug Use: No  . Sexual Activity: Not on file   Other Topics Concern  . Not on file   Social History Narrative   Lives at home with Husband. Active and Independent at baseline.     Family History  Problem Relation Age of Onset  . CAD Mother   . CAD Father      Review of Systems: Review of Systems  Constitutional: Positive for fever, chills, weight loss and malaise/fatigue. Negative for diaphoresis.  HENT: Positive for congestion and sore throat.   Eyes: Negative for discharge and  redness.  Respiratory: Positive for shortness of breath. Negative for cough, hemoptysis, sputum production and wheezing.   Cardiovascular: Negative for chest pain, palpitations, orthopnea, claudication, leg swelling and PND.  Gastrointestinal: Negative for nausea, vomiting and abdominal pain.  Musculoskeletal: Negative for falls.  Skin: Negative for rash.  Neurological: Positive for weakness and headaches. Negative for sensory change, speech change, focal weakness and loss of consciousness.  Endo/Heme/Allergies: Does not bruise/bleed easily.  Psychiatric/Behavioral: Negative for substance abuse. The patient is not nervous/anxious.   All other systems reviewed and are negative.   Labs:  Recent Labs  09/17/15 1432  09/17/15 1730 09/17/15 2305 09/18/15 0603  TROPONINI 0.04* 0.07* 0.20* 0.05*   Lab Results  Component Value Date   WBC 17.4* 09/18/2015   HGB 12.5 09/18/2015   HCT 37.6 09/18/2015   MCV 86.2 09/18/2015   PLT 178 09/18/2015     Recent Labs Lab 09/17/15 1432 09/18/15 0603  NA 134* 133*  K 3.4* 4.0  CL 103 107  CO2 21* 20*  BUN 13 11  CREATININE 1.00 0.82  CALCIUM 8.4* 7.1*  PROT 7.1  --   BILITOT 1.0  --   ALKPHOS 70  --   ALT 20  --   AST 20  --   GLUCOSE 160* 134*   No results found for: CHOL, HDL, LDLCALC, TRIG No results found for: DDIMER  Radiology/Studies:  Dg Chest 2 View  09/17/2015  CLINICAL DATA:  Two weeks of nonproductive cough, 2 days of fever and chills, history of asthma, nonsmoker. EXAM: CHEST  2 VIEW COMPARISON:  Chest x-ray of May 23, 2015 FINDINGS: There is new infiltrate in the right lower lobe anteriorly. The left lung is clear. There is a stable subcentimeter nodule in the left lower lobe. The heart and pulmonary vascularity are normal. The mediastinum is normal in width. The bony thorax exhibits no acute abnormality. IMPRESSION: New anterior right lower lobe pneumonia. Underlying reactive airway disease. Followup PA and lateral  chest X-ray is recommended in 3-4 weeks following trial of antibiotic therapy to ensure resolution and exclude underlying malignancy. Electronically Signed   By: David  Martinique M.D.   On: 09/17/2015 12:33   Ct Chest Wo Contrast  09/17/2015  CLINICAL DATA:  Fevers.  Pneumonia. EXAM: CT CHEST WITHOUT CONTRAST TECHNIQUE: Multidetector CT imaging of the chest was performed following the standard protocol without IV contrast. COMPARISON:  Plain film of earlier today and 05/23/2015. No prior CT. FINDINGS: Mediastinum/Nodes: Multiple small left low jugular nodes are nonspecific. Aortic and branch vessel atherosclerosis. Mild cardiomegaly, without pericardial effusion. LAD coronary artery atherosclerosis. Middle and anterior mediastinal adenopathy. A right paratracheal node measures 1.4 cm on image 26/series 2. A node within the azygoesophageal recess measures 2.6 x 1.8 cm on image 32/series 2. A more inferior node within azygoesophageal recess measures 2.0 x 2.9 cm on image 35/series 2. Right hilar adenopathy at 1.7 cm. Prevascular nodes measure up to 11 mm on image 24/series 2. Lungs/Pleura: Small right pleural effusion. Patchy right lower lobe ground-glass opacities. Right lower lobe pleural-based dense consolidation with surrounding ground-glass opacity. Minimal peripheral cavitation within this area. Example image 44/series 3. Minimal posterior left upper lobe nodularity, including at 4 mm on image 30/series 3. Upper abdomen: Mild hepatic steatosis. Normal imaged portions of the spleen, stomach, pancreas, gallbladder, adrenal glands, kidneys. Musculoskeletal: Mild osteopenia. IMPRESSION: 1. Pleural based right lower lobe consolidation with surrounding ground-glass opacity and minimal cavitation. Given the clinical history, most likely indicative of pneumonia. Given the morphology and position, pulmonary infarct could have a similar appearance. 2. Small right pleural effusion. 3. Significant thoracic adenopathy.  Although this could be reactive, given the severity, lymphoproliferative process or metastatic disease would be secondary concerns. This warrants followup with chest CT at 3 months to confirm resolution. 4.  Atherosclerosis, including within the coronary arteries. 5. Hepatic steatosis. 6. 4 mm left upper lobe pulmonary nodule. If the patient is at high risk for bronchogenic carcinoma, follow-up chest CT at 1 year is recommended. If the patient is at low risk, no follow-up is needed. This recommendation follows the consensus statement: "  Guidelines for Management of Small Pulmonary Nodules Detected on CT Scans: A Statement from the [employer]" as published in Radiology 2005; 237:395-400. Available online at: https://www.arnold.com/. Electronically Signed   By: Abigail Miyamoto M.D.   On: 09/17/2015 18:18    EKG: Afib with RVR, 105 bpm, no significant st/t changes   Weights: Filed Weights   09/17/15 1353 09/17/15 1748  Weight: 145 lb (65.772 kg) 158 lb 8 oz (71.895 kg)     Physical Exam: Blood pressure 139/65, pulse 88, temperature 97.9 F (36.6 C), temperature source Oral, resp. rate 19, height 5\' 5"  (1.651 m), weight 158 lb 8 oz (71.895 kg), SpO2 99 %. Body mass index is 26.38 kg/(m^2). General: Well developed, well nourished, in no acute distress. Head: Normocephalic, atraumatic, sclera non-icteric, no xanthomas, nares are without discharge.  Neck: Negative for carotid bruits. JVD not elevated. Lungs: Decreased breath sounds with rhonchi right base. Breathing is unlabored. Heart: Irregularly-irregular, with S1 S2. No murmurs, rubs, or gallops appreciated. Abdomen: Soft, non-tender, non-distended with normoactive bowel sounds. No hepatomegaly. No rebound/guarding. No obvious abdominal masses. Msk:  Strength and tone appear normal for age. Extremities: No clubbing or cyanosis. No edema.  Distal pedal pulses are 2+ and equal bilaterally. Neuro: Alert and  oriented X 3. No facial asymmetry. No focal deficit. Moves all extremities spontaneously. Psych:  Responds to questions appropriately with a normal affect.    Assessment and Plan:   1. New onset Afib with RVR: -Likely in the setting of her sepsis 2/2 CAP and UTI. As these improve her Afib is likely to improve as well -Currently rate controlled at rest with HR in the 90s, though suspect with minimal ambulation her HR would become tachycardic in the low 100s -Would aim for rate control at this time until the stresses of her sepsis in the setting of CAP and UTI improve as she is not symptomatic with this rhythm and she is unlikely to hold sinus rhythm at this time given the stresses of the above -Should she become symptomatic or her heart rate become difficult to control with rate control could pursue rhythm control vs TEE/DCCV -At this time it is uncertain to determine how long she has been in this rhythm as she is asymptomatic and her URI symptoms have been prolonged and sub-acute at times -Would avoid beta blocker usage in the acute setting at this time unless it is necessary given her COPD/asthma and respiratory distress in the setting of her CAP  -She has been started on Cardizem CD 120 mg daily, could use diltiazem 30 mg tid for breakthrough tachycardia with hold parameters for BP/HR -Check echo to evaluate LV function, valvular function, and right-sided pressure -Start Eliquis 5 mg bid (she only meets one of three criteria for decreased dosing - age) -CHADS2VASc at least 4 (HTN, age x 2, sex category)  2. Sepsis in the setting of CAP and UTI: -On Levaquin per IM -Consider adding Xopenex given her underlying COPD and asthma. Would avoid albuterol given #1 -Recently completed steroids first of January through PCP  3. Respiratory distress: -Currently on O2 via nasal cannula, wean as able -Possibly multifactorial including #2, and possibly small pleural effusion given her Afib -May need low  dose diuretic if Afib persists -Ambulate as able  4. Hypokalemia: -Resolved s/p repletion     SignedChristell Faith, PA-C Pager: [phone] 09/18/2015, 9:54 AM

## 2015-09-18 NOTE — Care Management Note (Signed)
Case Management Note  Patient Details  Name: Molly Hodges MRN: [id number] Date of Birth: [birth year]  Subjective/Objective:                   Met with patient who was struggling to make sentences due to O2 requirement/SOB. She covers from SOB when resting. I provided questioned with responses "yes" and "no" to prevent increased SOB. She refused SNF. She does agree with home health and does not have a preference as she has never had it before. O2 is new to this patient. She lives with her husband. She states he drives. Her PCP is Dr. Caryn Section on [address]. She denies difficulty obtaining Rx.   Action/Plan: RNCM to follow for home health/O2 needs.   Expected Discharge Date:                  Expected Discharge Plan:     In-House Referral:     Discharge planning Services  CM Consult  Post Acute Care Choice:  Home Health, Durable Medical Equipment Choice offered to:  Patient  DME Arranged:    DME Agency:  [place]:    [facility] Agency:     Status of Service:  In process, will continue to follow  Medicare Important Message Given:    Date Medicare IM Given:    Medicare IM give by:    Date Additional Medicare IM Given:    Additional Medicare Important Message give by:     If discussed at [place] of Stay Meetings, dates discussed:    Additional Comments:  Marshell Garfinkel, RN 09/18/2015, 1:31 PM

## 2015-09-18 NOTE — Progress Notes (Addendum)
Dr. Darvin Neighbours called about continued expiratory wheezing after breathing treatment and STAT IV steroids.  Information acknowledged.

## 2015-09-18 NOTE — Progress Notes (Signed)
ANTIBIOTIC CONSULT NOTE - Follow Up  Pharmacy Consult for Levofloxacin  Indication: pneumonia  Allergies  Allergen Reactions  . Shellfish Allergy Anaphylaxis  . Azithromycin Other (See Comments)    Extreme burning sensation at IV site  . Tamiflu [Oseltamivir Phosphate] Other (See Comments)    Reaction:  Hallucinations     Patient Measurements: Height: 5\' 5"  (165.1 cm) Weight: 158 lb 8 oz (71.895 kg) IBW/kg (Calculated) : 57 Adjusted Body Weight:   Vital Signs: Temp: 98.2 F (36.8 C) (01/31 1100) Temp Source: Oral (01/31 1100) BP: 149/87 mmHg (01/31 1211) Pulse Rate: 114 (01/31 1211) Intake/Output from previous day: 01/30 0701 - 01/31 0700 In: -  Out: 350 [Urine:350] Intake/Output from this shift: Total I/O In: 1985 [P.O.:120; I.V.:1865] Out: -   Labs:  Recent Labs  09/17/15 1432 09/18/15 0603  WBC 21.2* 17.4*  HGB 13.6 12.5  PLT 215 178  CREATININE 1.00 0.82   Estimated Creatinine Clearance: 53.5 mL/min (by C-Hodges formula based on Cr of 0.82). No results for input(s): VANCOTROUGH, VANCOPEAK, VANCORANDOM, GENTTROUGH, GENTPEAK, GENTRANDOM, TOBRATROUGH, TOBRAPEAK, TOBRARND, AMIKACINPEAK, AMIKACINTROU, AMIKACIN in the last 72 hours.   Microbiology: Recent Results (from the past 720 hour(s))  Blood Culture (routine x 2)     Status: None (Preliminary result)   Collection Time: 09/17/15 10:43 AM  Result Value Ref Range Status   Specimen Description BLOOD LEFT FATTY CASTS  Final   Special Requests BOTTLES DRAWN AEROBIC AND ANAEROBIC 5CC  Final   Culture NO GROWTH < 24 HOURS  Final   Report Status PENDING  Incomplete  Blood Culture (routine x 2)     Status: None (Preliminary result)   Collection Time: 09/17/15  2:32 PM  Result Value Ref Range Status   Specimen Description BLOOD RIGHT FATTY CASTS  Final   Special Requests BOTTLES DRAWN AEROBIC AND ANAEROBIC [place]  Final   Culture NO GROWTH < 24 HOURS  Final   Report Status PENDING  Incomplete  Urine culture      Status: None (Preliminary result)   Collection Time: 09/17/15  6:41 PM  Result Value Ref Range Status   Specimen Description URINE, RANDOM  Final   Special Requests NONE  Final   Culture NO GROWTH < 24 HOURS  Final   Report Status PENDING  Incomplete    Medical History: Past Medical History  Diagnosis Date  . Pneumonia   . Hypertension   . Palpitation   . COPD (chronic obstructive pulmonary disease) ([place])   . Asthma   . Peripheral neuropathy (HCC)     in both feet    Medications:  Prescriptions prior to admission  Medication Sig Dispense Refill Last Dose  . Calcium Carbonate-Vitamin D (CALCIUM 600+D) 600-400 MG-UNIT tablet Take 1 tablet by mouth daily.   Past Week at Unknown time  . cholecalciferol (VITAMIN D) 1000 UNITS tablet Take 1,000 Units by mouth daily.   Past Week at Unknown time  . dorzolamide (TRUSOPT) 2 % ophthalmic solution Place 1 drop into both eyes 2 (two) times daily.    09/17/2015 at Unknown time  . Fluticasone-Salmeterol (ADVAIR DISKUS) 250-50 MCG/DOSE AEPB Inhale 1 puff into the lungs 2 (two) times daily.   09/17/2015 at Unknown time  . gabapentin (NEURONTIN) 100 MG capsule Take 400 mg by mouth at bedtime.   09/16/2015 at Unknown time  . hydrochlorothiazide (MICROZIDE) 12.5 MG capsule Take 12.5 mg by mouth daily.   Past Week at Unknown time  . latanoprost (XALATAN) 0.005 % ophthalmic solution  Place 1 drop into both eyes at bedtime.    09/16/2015 at Unknown time  . magnesium oxide (MAG-OX) 400 MG tablet Take 400 mg by mouth daily.   Past Week at Unknown time  . Omega-3 Fatty Acids (FISH OIL BURP-LESS) 1000 MG CAPS Take 1,000 mg by mouth daily.    Past Week at Unknown time  . vitamin C (ASCORBIC ACID) 500 MG tablet Take 1,000 mg by mouth daily.   Past Week at Unknown time  . vitamin E 100 UNIT capsule Take 200 Units by mouth daily.   Past Week at Unknown time   Assessment: Patient is an 82 yo female receiving empiric treatment for CAP with Levaquin.  Est CrCl~53.5  ml/min  Goal of Therapy:  resolution of infection   Plan:  Due to improvement in renal function, will transition patient to Levaquin 500 mg IV q24h. Expected duration 7 days with resolution of temperature and/or normalization of WBC   Pharmacy will continue to follow.   Molly Hodges 09/18/2015,3:09 PM

## 2015-09-18 NOTE — Evaluation (Signed)
Physical Therapy Evaluation Patient Details Name: Molly Hodges MRN: [id number] DOB: 04-05-1932 Today's Date: 09/18/2015   History of Present Illness  presented to ER seconadry to fever, chills and R-sided chest pain; admitted with sepsis secondary to R PNA.  Currently on 2L supplemental O2.  Clinical Impression  Upon evaluation, patient alert and oriented; follows all commands and demonstrates good safety awareness/insight.  Bilat UE/LE strength and ROM grossly symmetrical and WFL for basic transfers and mobility; denies pain at this time.  Patient does, however, present with significant dyspnea on exertion (though sats remain >96% on 2L throughout session) that limits her ability to safety/indep complete functional transfers, household ambulation and ADLs. While patient does not require extensive physical assist for mobility efforts (no greater than cga/close sup), patient unable to tolerate gait beyond 1-2 steps at this time and unable to demonstrate cardiorespiratory endurance necessary to facilitate safe discharge home.   Would benefit from skilled PT to address above deficits and promote optimal return to PLOF; recommend transition to STR upon discharge from acute hospitalization (may progress to HHPT as respiratory status improves; will continue to monitor throughout stay).     Follow Up Recommendations SNF (may progress to HHPT as respiratory status improves)    Equipment Recommendations       Recommendations for Other Services       Precautions / Restrictions Precautions Precautions: Fall Restrictions Weight Bearing Restrictions: No      Mobility  Bed Mobility Overal bed mobility: Modified Independent                Transfers Overall transfer level: Needs assistance Equipment used: Rolling walker (2 wheeled) Transfers: Sit to/from Stand Sit to Stand: Supervision            Ambulation/Gait Ambulation/Gait assistance: Supervision Ambulation Distance  (Feet): 2 Feet Assistive device: Rolling walker (2 wheeled)       General Gait Details: 2 steps forward/backward with RW, close sup; fair/good stability without buckling or LOB.  Significant DOE; unable to tolerate additional activity as a result.  Stairs            Wheelchair Mobility    Modified Rankin (Stroke Patients Only)       Balance Overall balance assessment: Needs assistance Sitting-balance support: No upper extremity supported;Feet supported Sitting balance-Leahy Scale: Good     Standing balance support: Bilateral upper extremity supported Standing balance-Leahy Scale: Fair                               Pertinent Vitals/Pain Pain Assessment: No/denies pain    Home Living Family/patient expects to be discharged to:: Private residence Living Arrangements: Spouse/significant other Available Help at Discharge: Family Type of Home: House Home Access: Stairs to enter Entrance Stairs-Rails: Right Entrance Stairs-Number of Steps: 4 Home Layout: One level        Prior Function Level of Independence: Independent         Comments: Indep with ADLs, household and community mobility without assist device; denies fall history.  Enjoys baking.     Hand Dominance        Extremity/Trunk Assessment   Upper Extremity Assessment: Overall WFL for tasks assessed           Lower Extremity Assessment: Overall WFL for tasks assessed         Communication   Communication: No difficulties  Cognition Arousal/Alertness: Awake/alert Behavior During Therapy: WFL for tasks assessed/performed Overall Cognitive  Status: Within Functional Limits for tasks assessed                      General Comments      Exercises Other Exercises Other Exercises: Seated LE therex, 1x10, AROM for muscular strength/endurance with functional activities. Encouraged to perform outside of therapy to maintain muscular endurance during remaining  hospitalization.  Patient voiced understanding.      Assessment/Plan    PT Assessment Patient needs continued PT services  PT Diagnosis Generalized weakness;Difficulty walking   PT Problem List Decreased strength;Decreased range of motion;Decreased activity tolerance;Decreased balance;Decreased mobility;Decreased coordination;Cardiopulmonary status limiting activity  PT Treatment Interventions DME instruction;Gait training;Stair training;Functional mobility training;Therapeutic activities;Therapeutic exercise;Patient/family education   PT Goals (Current goals can be found in the Care Plan section) Acute Rehab PT Goals Patient Stated Goal: "to try a little" PT Goal Formulation: With patient/family Time For Goal Achievement: 10/02/15 Potential to Achieve Goals: Good    Frequency Min 2X/week   Barriers to discharge        Co-evaluation               End of Session Equipment Utilized During Treatment: Gait belt Activity Tolerance: Patient tolerated treatment well Patient left: in bed;with call bell/phone within reach;with bed alarm set;with family/visitor present           Time: [id number] PT Time Calculation (min) (ACUTE ONLY): 21 min   Charges:   PT Evaluation $PT Eval Moderate Complexity: 1 Procedure PT Treatments $Therapeutic Exercise: 8-22 mins   PT G Codes:        Alisandra Son H. Owens Shark, PT, DPT, NCS 09/18/2015, 10:23 AM [phone]

## 2015-09-19 ENCOUNTER — Encounter: Payer: Self-pay | Admitting: Student

## 2015-09-19 LAB — CBC WITH DIFFERENTIAL/PLATELET
Basophils Absolute: 0 10*3/uL (ref 0–0.1)
Basophils Relative: 0 %
Eosinophils Absolute: 0 10*3/uL (ref 0–0.7)
Eosinophils Relative: 0 %
HCT: 35 % (ref 35.0–47.0)
Hemoglobin: 11.7 g/dL — ABNORMAL LOW (ref 12.0–16.0)
Lymphocytes Relative: 5 %
Lymphs Abs: 0.9 10*3/uL — ABNORMAL LOW (ref 1.0–3.6)
MCH: 27.9 pg (ref 26.0–34.0)
MCHC: 33.4 g/dL (ref 32.0–36.0)
MCV: 83.6 fL (ref 80.0–100.0)
Monocytes Absolute: 0.6 10*3/uL (ref 0.2–0.9)
Monocytes Relative: 3 %
Neutro Abs: 17.1 10*3/uL — ABNORMAL HIGH (ref 1.4–6.5)
Neutrophils Relative %: 92 %
Platelets: 185 10*3/uL (ref 150–440)
RBC: 4.19 MIL/uL (ref 3.80–5.20)
RDW: 13.8 % (ref 11.5–14.5)
WBC: 18.6 10*3/uL — ABNORMAL HIGH (ref 3.6–11.0)

## 2015-09-19 LAB — BASIC METABOLIC PANEL
Anion gap: 5 (ref 5–15)
BUN: 19 mg/dL (ref 6–20)
CO2: 20 mmol/L — ABNORMAL LOW (ref 22–32)
Calcium: 7.9 mg/dL — ABNORMAL LOW (ref 8.9–10.3)
Chloride: 108 mmol/L (ref 101–111)
Creatinine, Ser: 0.72 mg/dL (ref 0.44–1.00)
GFR calc Af Amer: >60 mL/min (ref >60)
GFR calc non Af Amer: >60 mL/min (ref >60)
Glucose, Bld: 225 mg/dL — ABNORMAL HIGH (ref 65–99)
Potassium: 3.4 mmol/L — ABNORMAL LOW (ref 3.5–5.1)
Sodium: 133 mmol/L — ABNORMAL LOW (ref 135–145)

## 2015-09-19 LAB — URINE CULTURE: Culture: 6000

## 2015-09-19 MED ORDER — POTASSIUM CHLORIDE CRYS ER 20 MEQ PO TBCR
40.0000 meq | EXTENDED_RELEASE_TABLET | Freq: Once | ORAL | Status: AC
  Administered 2015-09-19: 40 meq via ORAL

## 2015-09-19 MED ORDER — IPRATROPIUM-ALBUTEROL 0.5-2.5 (3) MG/3ML IN SOLN
3.0000 mL | Freq: Three times a day (TID) | RESPIRATORY_TRACT | Status: DC
  Administered 2015-09-19 – 2015-09-22 (×9): 3 mL via RESPIRATORY_TRACT
  Filled 2015-09-19 (×9): qty 3

## 2015-09-19 MED ORDER — FUROSEMIDE 20 MG PO TABS
20.0000 mg | ORAL_TABLET | Freq: Once | ORAL | Status: AC
  Administered 2015-09-19: 20 mg via ORAL
  Filled 2015-09-19: qty 1

## 2015-09-19 MED ORDER — POTASSIUM CHLORIDE CRYS ER 20 MEQ PO TBCR
40.0000 meq | EXTENDED_RELEASE_TABLET | Freq: Once | ORAL | Status: AC
  Administered 2015-09-19: 40 meq via ORAL
  Filled 2015-09-19: qty 2

## 2015-09-19 MED ORDER — DILTIAZEM HCL ER COATED BEADS 180 MG PO CP24
180.0000 mg | ORAL_CAPSULE | Freq: Every day | ORAL | Status: DC
  Administered 2015-09-20 – 2015-09-23 (×4): 180 mg via ORAL
  Filled 2015-09-19 (×4): qty 1

## 2015-09-19 NOTE — Care Management Important Message (Signed)
Important Message  Patient Details  Name: Molly Hodges MRN: [id number] Date of Birth: 1931/11/06   Medicare Important Message Given:  Yes    Juliann Pulse A Rayelynn Loyal 09/19/2015, 11:00 AM

## 2015-09-19 NOTE — Progress Notes (Signed)
Pt. Was weaned off 02. Sa02 on RA are 97. BS are clear on the left side, diminished on the right. The score on the RT protocol assessment was a 6. Nebulizer treatments changed to TID and PRN.

## 2015-09-19 NOTE — Clinical Social Work Note (Signed)
Clinical Social Work Assessment  Patient Details  Name: Molly Hodges MRN: [id number] Date of Birth: Aug 03, 1932  Date of referral:  09/19/15               Reason for consult:  Discharge Planning                Permission sought to share information with:    Permission granted to share information::  No  Name::        Agency::     Relationship::     Contact Information:     Housing/Transportation Living arrangements for the past 2 months:  Single Family Home Source of Information:  Patient Patient Interpreter Needed:  None Criminal Activity/Legal Involvement Pertinent to Current Situation/Hospitalization:  No - Comment as needed Significant Relationships:  Adult Children, Spouse Lives with:  Spouse Do you feel safe going back to the place where you live?  Yes Need for family participation in patient care:  No (Coment)  Care giving concerns:  PT recommended SNF placement for patient.    Social Worker assessment / plan:  PT informed CSW that patient may need SNF placement. CSW met with patient at bedside. Patient was laying in bed. CSW explained role. Per patient she lives at home with her husband Molly Hodges. Verbal permission granted to speak to Molly Hodges if need. Patient reports that she wants to return home at discharge. She stated "I will be ready when I go home". Patient stated that she does not feel that SNF placement is "ideal" at this time. CSW left a SNF list with patient. She stated she does not need SNF at this time. She is open to possible HH services at discharge. CSW informed RN Case Manager of above. There are no other CSW needs at this time. CSW is available if a need were to arise. CSW is signing off.   Employment status:  Psychologist, counselling:  Medicare PT Recommendations:  [place] / Referral to community resources:  [place]  Patient/Family's Response to care:  Patient reports that she does not want SNF  placement at this time.   Patient/Family's Understanding of and Emotional Response to Diagnosis, Current Treatment, and Prognosis:  Patient understands CSW's role. She stated that she is appreciative for CSW's assistance but declines CSW's services at this time.   Emotional Assessment Appearance:  Appears stated age Attitude/Demeanor/Rapport:   (None ) Affect (typically observed):  Calm, Pleasant Orientation:  Oriented to Self, Oriented to Place, Oriented to  Time, Oriented to Situation Alcohol / Substance use:  Not Applicable Psych involvement (Current and /or in the community):  No (Comment)  Discharge Needs  Concerns to be addressed:  Discharge Planning Concerns Readmission within the last 30 days:    Current discharge risk:  Chronically ill Barriers to Discharge:  Continued Medical Work up   [employer], LCSW 09/19/2015, 6:13 PM

## 2015-09-19 NOTE — Progress Notes (Signed)
Patient Name: Molly Hodges Date of Encounter: 09/19/2015  Active Problems:   Sepsis [facility])   Atrial fibrillation ([place])   Community acquired pneumonia   Elevated troponin   Hypoxia   Encounter for anticoagulation discussion and counseling    Primary Cardiologist: Dr. Rockey Situ Patient Profile: 82 y.o. female w/ PMH of COPD, prior episodes of PNA, palpitations with no prior formal diagnosis of Afib per patient, and HTN who presented to [facility] on 09/17/2015 from her PCP office with cough, fever and chills and was found to have RLL PNA and be in new onset Afib with RVR.   SUBJECTIVE: Reports her breathing has improved since yesterday, especially following her nebulizer treatments. Denies any chest pain or palpitations.  OBJECTIVE Filed Vitals:   09/18/15 2121 09/19/15 0022 09/19/15 0410 09/19/15 0530  BP:    122/62  Pulse:    111  Temp:    98 F (36.7 C)  TempSrc:    Oral  Resp:    22  Height:      Weight:      SpO2: 99% 98% 98% 97%    Intake/Output Summary (Last 24 hours) at 09/19/15 0914 Last data filed at 09/18/15 2016  Gross per 24 hour  Intake   2328 ml  Output      0 ml  Net   2328 ml   Filed Weights   09/17/15 1353 09/17/15 1748  Weight: 145 lb (65.772 kg) 158 lb 8 oz (71.895 kg)    PHYSICAL EXAM General: Well developed, well nourished, female in no acute distress. Head: Normocephalic, atraumatic.  Neck: Supple without bruits, JVD not elevated. Lungs:  Resp regular and unlabored, Decreased breath sounds on the right. No active wheezing noted. Heart: Irregularly irregular, S1, S2, no S3, S4, or murmur; no rub. Abdomen: Soft, non-tender, non-distended with normoactive bowel sounds. No hepatomegaly. No rebound/guarding. No obvious abdominal masses. Extremities: No clubbing, cyanosis, or edema. Distal pedal pulses are 2+ bilaterally. SCD's in place. Neuro: Alert and oriented X 3. Moves all extremities spontaneously. Psych: Normal affect.  LABS: CBC: Recent  Labs  09/17/15 1432 09/18/15 0603 09/19/15 0713  WBC 21.2* 17.4* 18.6*  NEUTROABS 18.7*  --  17.1*  HGB 13.6 12.5 11.7*  HCT 41.0 37.6 35.0  MCV 83.3 86.2 83.6  PLT 215 178 185   INR:No results for input(s): INR in the last 72 hours. Basic Metabolic Panel: Recent Labs  09/18/15 0603 09/19/15 0713  NA 133* 133*  K 4.0 3.4*  CL 107 108  CO2 20* 20*  GLUCOSE 134* 225*  BUN 11 19  CREATININE 0.82 0.72  CALCIUM 7.1* 7.9*   Liver Function Tests: Recent Labs  09/17/15 1432  AST 20  ALT 20  ALKPHOS 70  BILITOT 1.0  PROT 7.1  ALBUMIN 3.6   Cardiac Enzymes: Recent Labs  09/17/15 1730 09/17/15 2305 09/18/15 0603  TROPONINI 0.07* 0.20* 0.05*    TELE:   Atrial fibrillation with HR in mid-80's - 110's. Episodes of aberrancy.     ECHO: Study Conclusions - Procedure narrative: Transthoracic echocardiography. Image quality was poor. The study was technically difficult, as a result of poor acoustic windows and poor sound wave transmission. - Left ventricle: The cavity size was normal. Systolic function was normal. The estimated ejection fraction was in the range of 60% to 65%. Wall motion was normal; there were no regional wall motion abnormalities. The study is not technically sufficient to allow evaluation of LV diastolic function. - Mitral  valve: There was mild regurgitation. - Left atrium: The atrium was mildly dilated. - Right ventricle: Systolic function was normal. - Pulmonary arteries: Systolic pressure was within the normal range.  Impressions: - Rhythm is atrial fibrillation.  Radiology/Studies: Dg Chest 2 View: 09/18/2015  CLINICAL DATA:  Fever, chills and worsening right-sided chest pain. EXAM: CHEST  2 VIEW COMPARISON:  CT of the chest 09/17/2015 FINDINGS: Cardiac silhouette is enlarged. Mediastinal contours appear intact. Aorta is torturous and contains atherosclerotic calcifications. There is no evidence of pneumothorax. Right lower lobe  subpleural airspace consolidation with small areas of cavitation projects as ill-defined airspace opacity. There are bilateral pleural effusions, right greater than left. Mild bronchiectasis is seen. Osseous structures are without acute abnormality. Soft tissues are grossly normal. IMPRESSION: Persistent right lower lobe subpleural cavitary mass versus airspace consolidation. Bilateral pleural effusions. There is worsening of the aeration of the lungs when compared to 09/17/2015. Its Enlarged cardiac silhouette, stable. Electronically Signed   By: Fidela Salisbury M.D.   On: 09/18/2015 14:05   Dg Chest 2 View: 09/17/2015  CLINICAL DATA:  Two weeks of nonproductive cough, 2 days of fever and chills, history of asthma, nonsmoker. EXAM: CHEST  2 VIEW COMPARISON:  Chest x-ray of May 23, 2015 FINDINGS: There is new infiltrate in the right lower lobe anteriorly. The left lung is clear. There is a stable subcentimeter nodule in the left lower lobe. The heart and pulmonary vascularity are normal. The mediastinum is normal in width. The bony thorax exhibits no acute abnormality. IMPRESSION: New anterior right lower lobe pneumonia. Underlying reactive airway disease. Followup PA and lateral chest X-ray is recommended in 3-4 weeks following trial of antibiotic therapy to ensure resolution and exclude underlying malignancy. Electronically Signed   By: David  Martinique M.D.   On: 09/17/2015 12:33   Ct Chest Wo Contrast: 09/17/2015  CLINICAL DATA:  Fevers.  Pneumonia. EXAM: CT CHEST WITHOUT CONTRAST TECHNIQUE: Multidetector CT imaging of the chest was performed following the standard protocol without IV contrast. COMPARISON:  Plain film of earlier today and 05/23/2015. No prior CT. FINDINGS: Mediastinum/Nodes: Multiple small left low jugular nodes are nonspecific. Aortic and branch vessel atherosclerosis. Mild cardiomegaly, without pericardial effusion. LAD coronary artery atherosclerosis. Middle and anterior mediastinal  adenopathy. A right paratracheal node measures 1.4 cm on image 26/series 2. A node within the azygoesophageal recess measures 2.6 x 1.8 cm on image 32/series 2. A more inferior node within azygoesophageal recess measures 2.0 x 2.9 cm on image 35/series 2. Right hilar adenopathy at 1.7 cm. Prevascular nodes measure up to 11 mm on image 24/series 2. Lungs/Pleura: Small right pleural effusion. Patchy right lower lobe ground-glass opacities. Right lower lobe pleural-based dense consolidation with surrounding ground-glass opacity. Minimal peripheral cavitation within this area. Example image 44/series 3. Minimal posterior left upper lobe nodularity, including at 4 mm on image 30/series 3. Upper abdomen: Mild hepatic steatosis. Normal imaged portions of the spleen, stomach, pancreas, gallbladder, adrenal glands, kidneys. Musculoskeletal: Mild osteopenia. IMPRESSION: 1. Pleural based right lower lobe consolidation with surrounding ground-glass opacity and minimal cavitation. Given the clinical history, most likely indicative of pneumonia. Given the morphology and position, pulmonary infarct could have a similar appearance. 2. Small right pleural effusion. 3. Significant thoracic adenopathy. Although this could be reactive, given the severity, lymphoproliferative process or metastatic disease would be secondary concerns. This warrants followup with chest CT at 3 months to confirm resolution. 4.  Atherosclerosis, including within the coronary arteries. 5. Hepatic steatosis. 6. 4 mm left  upper lobe pulmonary nodule. If the patient is at high risk for bronchogenic carcinoma, follow-up chest CT at 1 year is recommended. If the patient is at low risk, no follow-up is needed. This recommendation follows the consensus statement: "Guidelines for Management of Small Pulmonary Nodules Detected on CT Scans: A Statement from the [place]" as published in Radiology 2005; 237:395-400. Available online at:  https://www.arnold.com/. Electronically Signed   By: Abigail Miyamoto M.D.   On: 09/17/2015 18:18     Current Medications:  . apixaban  5 mg Oral BID  . calcium-vitamin D  1 tablet Oral Daily  . cholecalciferol  1,000 Units Oral Daily  . diltiazem  120 mg Oral Daily  . dorzolamide  1 drop Both Eyes BID  . gabapentin  400 mg Oral QHS  . ipratropium-albuterol  3 mL Nebulization Q4H  . latanoprost  1 drop Both Eyes QHS  . levofloxacin (LEVAQUIN) IV  500 mg Intravenous Q24H  . magnesium oxide  400 mg Oral Daily  . methylPREDNISolone (SOLU-MEDROL) injection  60 mg Intravenous Q24H  . mometasone-formoterol  2 puff Inhalation BID  . omega-3 acid ethyl esters  1,000 mg Oral Daily  . sodium chloride flush  3 mL Intravenous Q12H  . vitamin C  1,000 mg Oral Daily  . vitamin E  200 Units Oral Daily      ASSESSMENT AND PLAN:  1. New onset Afib with RVR - Likely in the setting of her sepsis 2/2 CAP and UTI. As these improve her Afib is likely to improve as well - Would aim for rate control at this time until the stresses of her sepsis in the setting of CAP and UTI improve as she is not symptomatic with this rhythm and she is unlikely to hold sinus rhythm at this time given the stresses of the above. If she does become symptomatic or her heart rate become difficult to control with rate control could pursue rhythm control vs TEE/DCCV - Would avoid beta blocker usage in the acute setting at this time unless it is necessary given her COPD/asthma and respiratory distress in the setting of her CAP. - Echo showed preserved EF of 60-65%. Mild MR noted along with mildly dilated LA. - Started on Cardizem CD 120 mg daily on 09/18/2015. HR remains elevated in at times in the 110's. BP has been stable in the 120's - 140's. Will increase Cardizem CD to 180mg  daily. - CHADS2VASc at least 4 (HTN, age x 2, sex category). Continue anticoagulation with Eliquis 5mg  BID.  2. Sepsis in the  setting of CAP and UTI -On Levaquin per IM -Consider adding Xopenex given her underlying COPD and asthma. Would avoid albuterol given #1 -Recently completed steroids first of January through PCP  3. Respiratory distress -Currently on O2 via nasal cannula, wean as able -Possibly multifactorial including #2, and possible small pleural effusion given her Afib -Ambulate as able  4. Hypokalemia - K+ 3.4 this AM - will replace.  5. 4 mm left upper lobe pulmonary nodule.  - Follow-Up CT in 1 year recommended.  Signed, Erma Heritage , PA-C 9:14 AM 09/19/2015 Pager: [phone]

## 2015-09-19 NOTE — Progress Notes (Signed)
Telemetry clerk notified me of pt having a few runs of Vtach, 7 beats, 2 beats, 15 beats & then 2 beats. Pt asymptomatic. MD notified. Dr. Marcille Blanco aware & just wants to monitor. Conley Simmonds, RN

## 2015-09-19 NOTE — Progress Notes (Signed)
[facility] [place] NAME: Molly Hodges    MR#:  [id number]  DATE OF BIRTH:  January 27, 1932  SUBJECTIVE:  CHIEF COMPLAINT:   Chief Complaint  Patient presents with  . Fever   SOB  with wheezing improved today. Right pleuritic chest pain Tele shows Afib  REVIEW OF SYSTEMS:    Review of Systems  Constitutional: Positive for malaise/fatigue. Negative for fever and chills.  HENT: Negative for sore throat.   Eyes: Negative for blurred vision, double vision and pain.  Respiratory: Positive for cough, shortness of breath and wheezing. Negative for hemoptysis.   Cardiovascular: Positive for chest pain. Negative for palpitations, orthopnea and leg swelling.  Gastrointestinal: Negative for heartburn, nausea, vomiting, abdominal pain, diarrhea and constipation.  Genitourinary: Negative for dysuria and hematuria.  Musculoskeletal: Negative for back pain and joint pain.  Skin: Negative for rash.  Neurological: Positive for weakness. Negative for sensory change, speech change, focal weakness and headaches.  Endo/Heme/Allergies: Does not bruise/bleed easily.  Psychiatric/Behavioral: Negative for depression. The patient is nervous/anxious.       DRUG ALLERGIES:   Allergies  Allergen Reactions  . Shellfish Allergy Anaphylaxis  . Azithromycin Other (See Comments)    Extreme burning sensation at IV site  . Tamiflu [Oseltamivir Phosphate] Other (See Comments)    Reaction:  Hallucinations     VITALS:  Blood pressure 122/62, pulse 111, temperature 98 F (36.7 C), temperature source Oral, resp. rate 22, height 5\' 5"  (1.651 m), weight 71.895 kg (158 lb 8 oz), SpO2 96 %.  PHYSICAL EXAMINATION:   Physical Exam  GENERAL:  82 y.o.-year-old patient lying in the bed with  Acute resp distress.  EYES: Pupils equal, round, reactive to light and accommodation. No scleral icterus. Extraocular muscles intact.  HEENT: Head atraumatic,  normocephalic. Oropharynx and nasopharynx clear.  NECK:  Supple, no jugular venous distention. No thyroid enlargement, no tenderness.  LUNGS: Decreased breath sounds. Wheezing. CARDIOVASCULAR: S1, S2 normal. No murmurs, rubs, or gallops.  ABDOMEN: Soft, nontender, nondistended. Bowel sounds present. No organomegaly or mass.  EXTREMITIES: No cyanosis, clubbing or edema b/l.  NEUROLOGIC: Cranial nerves II through XII are intact. No focal Motor or sensory deficits b/l.   PSYCHIATRIC: The patient is alert and oriented x 3. Anxious SKIN: No obvious rash, lesion, or ulcer.    LABORATORY PANEL:   CBC  Recent Labs Lab 09/19/15 0713  WBC 18.6*  HGB 11.7*  HCT 35.0  PLT 185   ------------------------------------------------------------------------------------------------------------------  Chemistries   Recent Labs Lab 09/17/15 1432  09/19/15 0713  NA 134*  < > 133*  K 3.4*  < > 3.4*  CL 103  < > 108  CO2 21*  < > 20*  GLUCOSE 160*  < > 225*  BUN 13  < > 19  CREATININE 1.00  < > 0.72  CALCIUM 8.4*  < > 7.9*  AST 20  --   --   ALT 20  --   --   ALKPHOS 70  --   --   BILITOT 1.0  --   --   < > = values in this interval not displayed. ------------------------------------------------------------------------------------------------------------------  Cardiac Enzymes  Recent Labs Lab 09/18/15 0603  TROPONINI 0.05*   ------------------------------------------------------------------------------------------------------------------  RADIOLOGY:  Dg Chest 2 View  09/18/2015  CLINICAL DATA:  Fever, chills and worsening right-sided chest pain. EXAM: CHEST  2 VIEW COMPARISON:  CT of the chest 09/17/2015 FINDINGS: Cardiac silhouette is enlarged. Mediastinal contours appear  intact. Aorta is torturous and contains atherosclerotic calcifications. There is no evidence of pneumothorax. Right lower lobe subpleural airspace consolidation with small areas of cavitation projects as ill-defined  airspace opacity. There are bilateral pleural effusions, right greater than left. Mild bronchiectasis is seen. Osseous structures are without acute abnormality. Soft tissues are grossly normal. IMPRESSION: Persistent right lower lobe subpleural cavitary mass versus airspace consolidation. Bilateral pleural effusions. There is worsening of the aeration of the lungs when compared to 09/17/2015. Its Enlarged cardiac silhouette, stable. Electronically Signed   By: Fidela Salisbury M.D.   On: 09/18/2015 14:05   Dg Chest 2 View  09/17/2015  CLINICAL DATA:  Two weeks of nonproductive cough, 2 days of fever and chills, history of asthma, nonsmoker. EXAM: CHEST  2 VIEW COMPARISON:  Chest x-ray of May 23, 2015 FINDINGS: There is new infiltrate in the right lower lobe anteriorly. The left lung is clear. There is a stable subcentimeter nodule in the left lower lobe. The heart and pulmonary vascularity are normal. The mediastinum is normal in width. The bony thorax exhibits no acute abnormality. IMPRESSION: New anterior right lower lobe pneumonia. Underlying reactive airway disease. Followup PA and lateral chest X-ray is recommended in 3-4 weeks following trial of antibiotic therapy to ensure resolution and exclude underlying malignancy. Electronically Signed   By: David  Martinique M.D.   On: 09/17/2015 12:33   Ct Chest Wo Contrast  09/17/2015  CLINICAL DATA:  Fevers.  Pneumonia. EXAM: CT CHEST WITHOUT CONTRAST TECHNIQUE: Multidetector CT imaging of the chest was performed following the standard protocol without IV contrast. COMPARISON:  Plain film of earlier today and 05/23/2015. No prior CT. FINDINGS: Mediastinum/Nodes: Multiple small left low jugular nodes are nonspecific. Aortic and branch vessel atherosclerosis. Mild cardiomegaly, without pericardial effusion. LAD coronary artery atherosclerosis. Middle and anterior mediastinal adenopathy. A right paratracheal node measures 1.4 cm on image 26/series 2. A node  within the azygoesophageal recess measures 2.6 x 1.8 cm on image 32/series 2. A more inferior node within azygoesophageal recess measures 2.0 x 2.9 cm on image 35/series 2. Right hilar adenopathy at 1.7 cm. Prevascular nodes measure up to 11 mm on image 24/series 2. Lungs/Pleura: Small right pleural effusion. Patchy right lower lobe ground-glass opacities. Right lower lobe pleural-based dense consolidation with surrounding ground-glass opacity. Minimal peripheral cavitation within this area. Example image 44/series 3. Minimal posterior left upper lobe nodularity, including at 4 mm on image 30/series 3. Upper abdomen: Mild hepatic steatosis. Normal imaged portions of the spleen, stomach, pancreas, gallbladder, adrenal glands, kidneys. Musculoskeletal: Mild osteopenia. IMPRESSION: 1. Pleural based right lower lobe consolidation with surrounding ground-glass opacity and minimal cavitation. Given the clinical history, most likely indicative of pneumonia. Given the morphology and position, pulmonary infarct could have a similar appearance. 2. Small right pleural effusion. 3. Significant thoracic adenopathy. Although this could be reactive, given the severity, lymphoproliferative process or metastatic disease would be secondary concerns. This warrants followup with chest CT at 3 months to confirm resolution. 4.  Atherosclerosis, including within the coronary arteries. 5. Hepatic steatosis. 6. 4 mm left upper lobe pulmonary nodule. If the patient is at high risk for bronchogenic carcinoma, follow-up chest CT at 1 year is recommended. If the patient is at low risk, no follow-up is needed. This recommendation follows the consensus statement: "Guidelines for Management of Small Pulmonary Nodules Detected on CT Scans: A Statement from the [employer]" as published in Radiology 2005; 237:395-400. Available online at: [email or web address].arnold.com/. Electronically Signed   By: Marylyn Ishihara  Jobe Igo M.D.    On: 09/17/2015 18:18     ASSESSMENT AND PLAN:   Chatoya Stenger is a 82 y.o. female with a known history of hypertension, COPD not on home oxygen and prior history of pneumonia presents to the hospital secondary to fevers chills and worsening right-sided chest pain.  # RLL PNA with COPD exacerbation and Acute hypoxic respiratory failure and sepsis - Improving -IV steroids, Antibiotics - Scheduled Nebulizers - Inhalers -Wean O2 as tolerated  # Atrial fibrillation-history of paroxysmal A. fib happened once, never had any further issues with that. Rate controlled -Likely triggered sepsis now. - PO Cardizem - cardiology consulted.  - ECHO - EF 65% - Minimal elevation of troponin  # hypertension Hold the hydrochlorothiazide On PO cardizem  # hypokalemia- replaced  # DVT prophylaxis-on Lovenox  All the records are reviewed and case discussed with Care Management/Social Workerr. Management plans discussed with the patient, family and they are in agreement.  CODE STATUS: FULL  DVT Prophylaxis: SCDs  TOTAL TIME TAKING CARE OF THIS PATIENT: 30 minutes.    Hillary Bow R M.D on 09/19/2015 at 11:11 AM  Between 7am to 6pm - Pager - [phone]  After 6pm go to [email or web address] - password [email or web address] [facility]  Office  [phone]  CC: Primary care physician; Lelon Huh, MD    Note: This dictation was prepared with Dragon dictation along with smaller phrase technology. Any transcriptional errors that result from this process are unintentional.

## 2015-09-20 DIAGNOSIS — I480 Paroxysmal atrial fibrillation: Secondary | ICD-10-CM | POA: Insufficient documentation

## 2015-09-20 LAB — CBC WITH DIFFERENTIAL/PLATELET
Basophils Absolute: 0 10*3/uL (ref 0–0.1)
Basophils Relative: 0 %
Eosinophils Absolute: 0 10*3/uL (ref 0–0.7)
Eosinophils Relative: 0 %
HCT: 37.2 % (ref 35.0–47.0)
Hemoglobin: 12.2 g/dL (ref 12.0–16.0)
Lymphocytes Relative: 3 %
Lymphs Abs: 0.8 10*3/uL — ABNORMAL LOW (ref 1.0–3.6)
MCH: 27.3 pg (ref 26.0–34.0)
MCHC: 32.8 g/dL (ref 32.0–36.0)
MCV: 83.1 fL (ref 80.0–100.0)
Monocytes Absolute: 0.8 10*3/uL (ref 0.2–0.9)
Monocytes Relative: 3 %
Neutro Abs: 23.1 10*3/uL — ABNORMAL HIGH (ref 1.4–6.5)
Neutrophils Relative %: 94 %
Platelets: 251 10*3/uL (ref 150–440)
RBC: 4.48 MIL/uL (ref 3.80–5.20)
RDW: 13.7 % (ref 11.5–14.5)
WBC: 24.6 10*3/uL — ABNORMAL HIGH (ref 3.6–11.0)

## 2015-09-20 LAB — BASIC METABOLIC PANEL
Anion gap: 5 (ref 5–15)
BUN: 24 mg/dL — ABNORMAL HIGH (ref 6–20)
CO2: 23 mmol/L (ref 22–32)
Calcium: 8.5 mg/dL — ABNORMAL LOW (ref 8.9–10.3)
Chloride: 107 mmol/L (ref 101–111)
Creatinine, Ser: 0.8 mg/dL (ref 0.44–1.00)
GFR calc Af Amer: >60 mL/min (ref >60)
GFR calc non Af Amer: >60 mL/min (ref >60)
Glucose, Bld: 193 mg/dL — ABNORMAL HIGH (ref 65–99)
Potassium: 5.1 mmol/L (ref 3.5–5.1)
Sodium: 135 mmol/L (ref 135–145)

## 2015-09-20 LAB — MAGNESIUM: Magnesium: 2.3 mg/dL (ref 1.7–2.4)

## 2015-09-20 MED ORDER — PREDNISONE 20 MG PO TABS
20.0000 mg | ORAL_TABLET | Freq: Every day | ORAL | Status: DC
  Administered 2015-09-21 – 2015-09-23 (×3): 20 mg via ORAL
  Filled 2015-09-20 (×3): qty 1

## 2015-09-20 MED ORDER — GUAIFENESIN ER 600 MG PO TB12
600.0000 mg | ORAL_TABLET | Freq: Two times a day (BID) | ORAL | Status: DC
  Administered 2015-09-20 – 2015-09-23 (×7): 600 mg via ORAL
  Filled 2015-09-20 (×7): qty 1

## 2015-09-20 MED ORDER — MENTHOL 3 MG MT LOZG
1.0000 | LOZENGE | OROMUCOSAL | Status: DC | PRN
  Administered 2015-09-21 – 2015-09-22 (×2): 3 mg via ORAL
  Filled 2015-09-20 (×2): qty 9

## 2015-09-20 NOTE — Progress Notes (Signed)
[facility] [place] NAME: Molly Hodges    MR#:  [id number]  DATE OF BIRTH:  [birth year]  SUBJECTIVE:  CHIEF COMPLAINT:   Chief Complaint  Patient presents with  . Fever   SOB still present at rest. Still has Right pleuritic chest pain. Tele shows Afib, rate controlled.  REVIEW OF SYSTEMS:    Review of Systems  Constitutional: Positive for malaise/fatigue. Negative for fever and chills.  HENT: Negative for sore throat.   Eyes: Negative for blurred vision, double vision and pain.  Respiratory: Positive for cough, shortness of breath and wheezing. Negative for hemoptysis.   Cardiovascular: Positive for chest pain. Negative for palpitations, orthopnea and leg swelling.  Gastrointestinal: Negative for heartburn, nausea, vomiting, abdominal pain, diarrhea and constipation.  Genitourinary: Negative for dysuria and hematuria.  Musculoskeletal: Negative for back pain and joint pain.  Skin: Negative for rash.  Neurological: Positive for weakness. Negative for sensory change, speech change, focal weakness and headaches.  Endo/Heme/Allergies: Does not bruise/bleed easily.  Psychiatric/Behavioral: Negative for depression. The patient is nervous/anxious.       DRUG ALLERGIES:   Allergies  Allergen Reactions  . Shellfish Allergy Anaphylaxis  . Azithromycin Other (See Comments)    Extreme burning sensation at IV site  . Tamiflu [Oseltamivir Phosphate] Other (See Comments)    Reaction:  Hallucinations     VITALS:  Blood pressure 156/78, pulse 95, temperature 97.4 F (36.3 C), temperature source Oral, resp. rate 18, height 5\' 5"  (1.651 m), weight 71.895 kg (158 lb 8 oz), SpO2 93 %.  PHYSICAL EXAMINATION:   Physical Exam  GENERAL:  82 y.o.-year-old patient lying in the bed with conversational dyspnea EYES: Pupils equal, round, reactive to light and accommodation. No scleral icterus. Extraocular muscles intact.  HEENT: Head  atraumatic, normocephalic. Oropharynx and nasopharynx clear.  NECK:  Supple, no jugular venous distention. No thyroid enlargement, no tenderness.  LUNGS: Decreased breath sounds. Wheezing improved. CARDIOVASCULAR: S1, S2 normal. No murmurs, rubs, or gallops.  ABDOMEN: Soft, nontender, nondistended. Bowel sounds present. No organomegaly or mass.  EXTREMITIES: No cyanosis, clubbing or edema b/l.  NEUROLOGIC: Cranial nerves II through XII are intact. No focal Motor or sensory deficits b/l.   PSYCHIATRIC: The patient is alert and oriented x 3. SKIN: No obvious rash, lesion, or ulcer.    LABORATORY PANEL:   CBC  Recent Labs Lab 09/20/15 0612  WBC 24.6*  HGB 12.2  HCT 37.2  PLT 251   ------------------------------------------------------------------------------------------------------------------  Chemistries   Recent Labs Lab 09/17/15 1432  09/20/15 0612  NA 134*  < > 135  K 3.4*  < > 5.1  CL 103  < > 107  CO2 21*  < > 23  GLUCOSE 160*  < > 193*  BUN 13  < > 24*  CREATININE 1.00  < > 0.80  CALCIUM 8.4*  < > 8.5*  MG  --   --  2.3  AST 20  --   --   ALT 20  --   --   ALKPHOS 70  --   --   BILITOT 1.0  --   --   < > = values in this interval not displayed. ------------------------------------------------------------------------------------------------------------------  Cardiac Enzymes  Recent Labs Lab 09/18/15 0603  TROPONINI 0.05*   ------------------------------------------------------------------------------------------------------------------  RADIOLOGY:  Dg Chest 2 View  09/18/2015  CLINICAL DATA:  Fever, chills and worsening right-sided chest pain. EXAM: CHEST  2 VIEW COMPARISON:  CT of the chest  09/17/2015 FINDINGS: Cardiac silhouette is enlarged. Mediastinal contours appear intact. Aorta is torturous and contains atherosclerotic calcifications. There is no evidence of pneumothorax. Right lower lobe subpleural airspace consolidation with small areas of  cavitation projects as ill-defined airspace opacity. There are bilateral pleural effusions, right greater than left. Mild bronchiectasis is seen. Osseous structures are without acute abnormality. Soft tissues are grossly normal. IMPRESSION: Persistent right lower lobe subpleural cavitary mass versus airspace consolidation. Bilateral pleural effusions. There is worsening of the aeration of the lungs when compared to 09/17/2015. Its Enlarged cardiac silhouette, stable. Electronically Signed   By: Fidela Salisbury M.D.   On: 09/18/2015 14:05     ASSESSMENT AND PLAN:   Molly Hodges is a 82 y.o. female with a known history of hypertension, COPD not on home oxygen and prior history of pneumonia presents to the hospital secondary to fevers chills and worsening right-sided chest pain.  # RLL PNA with COPD exacerbation and Acute hypoxic respiratory failure and sepsis - Improving -IV steroids, Antibiotics - Scheduled Nebulizers - Inhalers - Add Mucinex. Order flutter valve.  # Worsening leukocytosis : Be due to pneumonia or steroids. If patient has no improvement or has worsening leukocytosis will get a CT scan of the chest. Repeat labs in the morning.  # Atrial fibrillation-history of paroxysmal A. fib happened once, never had any further issues with that. Rate controlled -Likely triggered by sepsis. - PO Cardizem - cardiology consulted.  - ECHO - EF 65% - Minimal elevation of troponin  # hypertension Hold the hydrochlorothiazide On PO cardizem  # hypokalemia- replaced  # DVT prophylaxis-on Lovenox  All the records are reviewed and case discussed with Care Management/Social Workerr. Management plans discussed with the patient, family and they are in agreement.  CODE STATUS: FULL  DVT Prophylaxis: SCDs  TOTAL TIME TAKING CARE OF THIS PATIENT: 30 minutes.   Possible discharge in 2-3 days.   Hillary Bow R M.D on 09/20/2015 at 10:00 AM  Between 7am to 6pm - Pager -  [phone]  After 6pm go to [email or web address] - password [email or web address] [facility]  Office  [phone]  CC: Primary care physician; Lelon Huh, MD    Note: This dictation was prepared with Dragon dictation along with smaller phrase technology. Any transcriptional errors that result from this process are unintentional.

## 2015-09-20 NOTE — Progress Notes (Signed)
Hospital Problem List     Active Problems:   Sepsis ([place])   New onset atrial fibrillation ([place])   Community acquired pneumonia   Elevated troponin   Hypoxia   Encounter for anticoagulation discussion and counseling    Patient Profile:   Primary Cardiologist: Dr. Rockey Situ Patient Profile: 82 y.o. female w/ PMH of asthma, prior episodes of PNA, palpitations with no prior formal diagnosis of Afib per patient, and HTN who presented to [facility] on 09/17/2015 from her PCP office with cough, fever and chills and was found to have RLL PNA and be in new onset Afib with RVR.   Subjective   Breathing has improved, now off of Nasal Cannula. Having a productive cough this AM. Denies any chest pain or palpitations.  Inpatient Medications    . apixaban  5 mg Oral BID  . calcium-vitamin D  1 tablet Oral Daily  . cholecalciferol  1,000 Units Oral Daily  . diltiazem  180 mg Oral Daily  . dorzolamide  1 drop Both Eyes BID  . gabapentin  400 mg Oral QHS  . ipratropium-albuterol  3 mL Nebulization TID  . latanoprost  1 drop Both Eyes QHS  . levofloxacin (LEVAQUIN) IV  500 mg Intravenous Q24H  . magnesium oxide  400 mg Oral Daily  . mometasone-formoterol  2 puff Inhalation BID  . omega-3 acid ethyl esters  1,000 mg Oral Daily  . [START ON 09/21/2015] predniSONE  20 mg Oral Q breakfast  . sodium chloride flush  3 mL Intravenous Q12H  . vitamin C  1,000 mg Oral Daily  . vitamin E  200 Units Oral Daily    Vital Signs    Filed Vitals:   09/19/15 1512 09/19/15 2005 09/20/15 0443 09/20/15 0808  BP:  141/70 135/68 156/78  Pulse:  89 83 95  Temp:  97.9 F (36.6 C) 97.4 F (36.3 C)   TempSrc:  Oral Oral   Resp:  18 18   Height:      Weight:      SpO2: 97% 96% 97% 93%    Intake/Output Summary (Last 24 hours) at 09/20/15 0906 Last data filed at 09/20/15 0659  Gross per 24 hour  Intake    240 ml  Output    250 ml  Net    -10 ml   Filed Weights   09/17/15 1353 09/17/15 1748  Weight: 145 lb  (65.772 kg) 158 lb 8 oz (71.895 kg)    Physical Exam    General: Well developed, well nourished, female in no acute distress. Head: Normocephalic, atraumatic.  Neck: Supple without bruits, JVD not elevated. Lungs:  Resp regular and unlabored, Decreased breath sounds on the right, improved from yesterday. Heart: Irregularly irregular, S1, S2, no S3, S4, or murmur; no rub. Abdomen: Soft, non-tender, non-distended with normoactive bowel sounds. No hepatomegaly. No rebound/guarding. No obvious abdominal masses. Extremities: No clubbing, cyanosis, or edema. Distal pedal pulses are 2+ bilaterally. Neuro: Alert and oriented X 3. Moves all extremities spontaneously. Psych: Normal affect.  Labs    CBC  Recent Labs  09/19/15 0713 09/20/15 0612  WBC 18.6* 24.6*  NEUTROABS 17.1* 23.1*  HGB 11.7* 12.2  HCT 35.0 37.2  MCV 83.6 83.1  PLT 185 [id number]   Basic Metabolic Panel  Recent Labs  09/19/15 0713 09/20/15 0612  NA 133* 135  K 3.4* 5.1  CL 108 107  CO2 20* 23  GLUCOSE 225* 193*  BUN 19 24*  CREATININE 0.72 0.80  CALCIUM 7.9* 8.5*  MG  --  2.3   Liver Function Tests  Recent Labs  09/17/15 1432  AST 20  ALT 20  ALKPHOS 70  BILITOT 1.0  PROT 7.1  ALBUMIN 3.6   No results for input(s): LIPASE, AMYLASE in the last 72 hours. Cardiac Enzymes  Recent Labs  09/17/15 1730 09/17/15 2305 09/18/15 0603  TROPONINI 0.07* 0.20* 0.05*    Telemetry    Atrial fibrillation with rate in mid-80's - 110's. Episodes of 2-4 beats of NSVT vs. aberrancy.  ECG    No new tracings.   Cardiac Studies and Radiology    Dg Chest 2 View: 09/18/2015  CLINICAL DATA:  Fever, chills and worsening right-sided chest pain. EXAM: CHEST  2 VIEW COMPARISON:  CT of the chest 09/17/2015 FINDINGS: Cardiac silhouette is enlarged. Mediastinal contours appear intact. Aorta is torturous and contains atherosclerotic calcifications. There is no evidence of pneumothorax. Right lower lobe subpleural airspace  consolidation with small areas of cavitation projects as ill-defined airspace opacity. There are bilateral pleural effusions, right greater than left. Mild bronchiectasis is seen. Osseous structures are without acute abnormality. Soft tissues are grossly normal. IMPRESSION: Persistent right lower lobe subpleural cavitary mass versus airspace consolidation. Bilateral pleural effusions. There is worsening of the aeration of the lungs when compared to 09/17/2015. Its Enlarged cardiac silhouette, stable. Electronically Signed   By: Fidela Salisbury M.D.   On: 09/18/2015 14:05   Dg Chest 2 View: 09/17/2015  CLINICAL DATA:  Two weeks of nonproductive cough, 2 days of fever and chills, history of asthma, nonsmoker. EXAM: CHEST  2 VIEW COMPARISON:  Chest x-ray of May 23, 2015 FINDINGS: There is new infiltrate in the right lower lobe anteriorly. The left lung is clear. There is a stable subcentimeter nodule in the left lower lobe. The heart and pulmonary vascularity are normal. The mediastinum is normal in width. The bony thorax exhibits no acute abnormality. IMPRESSION: New anterior right lower lobe pneumonia. Underlying reactive airway disease. Followup PA and lateral chest X-ray is recommended in 3-4 weeks following trial of antibiotic therapy to ensure resolution and exclude underlying malignancy. Electronically Signed   By: David  Martinique M.D.   On: 09/17/2015 12:33    Echocardiogram: 09/17/2015 Study Conclusions  - Procedure narrative: Transthoracic echocardiography. Image quality was poor. The study was technically difficult, as a result of poor acoustic windows and poor sound wave transmission. - Left ventricle: The cavity size was normal. Systolic function was normal. The estimated ejection fraction was in the range of 60% to 65%. Wall motion was normal; there were no regional wall motion abnormalities. The study is not technically sufficient to allow evaluation of LV diastolic  function. - Mitral valve: There was mild regurgitation. - Left atrium: The atrium was mildly dilated. - Right ventricle: Systolic function was normal. - Pulmonary arteries: Systolic pressure was within the normal range.  Impressions: - Rhythm is atrial fibrillation.   Assessment & Plan    1. New onset Atrial Fibrillation with RVR - Likely in the setting of her sepsis 2/2 CAP and UTI. As these improve her Afib is likely to improve as well - Would aim for rate control at this time until the stresses of her sepsis in the setting of CAP and UTI improve as she is not symptomatic with this rhythm and she is unlikely to hold sinus rhythm at this time given the stresses of the above. If she does become symptomatic or her heart rate become difficult to control  with rate control could pursue rhythm control vs TEE/DCCV. Otherwise, would need 4 weeks og anticoagulation prior to DCCV. - Would avoid beta blocker usage in the acute setting at this time unless it is necessary given her COPD/asthma and respiratory distress in the setting of her CAP. - Echo showed preserved EF of 60-65%. Mild MR noted along with mildly dilated LA. - Started on Cardizem CD 120 mg daily on 09/18/2015. Increased to Cardizem CD 180mg  daily on 09/19/2015 with improvement in her HR since then. - CHADS2VASc at least 4 (HTN, age x 2, Female). Continue anticoagulation with Eliquis 5mg  BID.  2. Sepsis in the setting of CAP and UTI - WBC elevated to 24.6 on 09/20/2015, previously 18.6 on 09/19/2015. - On Levaquin per IM - Would favor Xopenex over Albuterol given her underlying COPD and asthma.   3. Respiratory distress - Weaned off of Moonshine. Saturations have been in the high-90's on room air. - Possibly multifactorial including #2, and possible small pleural effusion given her Afib - Planning to ambulate today to assess O2 saturations and HR with activity.  4. Hypokalemia - 5.1 on 09/20/2015 - currently resolved.  5. 4 mm left upper  lobe pulmonary nodule.  - Follow-Up CT in 1 year recommended.  Arna Medici , PA-C 9:06 AM 09/20/2015 Pager: [phone]   Attending Note Patient seen and examined, agree with detailed note above,  Patient presentation and plan discussed on rounds.   On evaluation today, patient has continued shortness of breath, bronchospasm Saturations more than 95% on room air, currently not on nasal cannula oxygen She is receiving nebulizer treatments Review of telemetry shows heart rate averaging 80s at rest, up to 100 with exertion She has not been out of bed yet to ambulate very far, only to bathroom Maximal heart rate 105  Tolerating Cardizem extended release 180 mg daily Currently on anticoagulation  --- Would continue current medication management for atrial fibrillation  --as detailed above, once pneumonia has improved, bronchospastic cough markedly better, could plan cardioversion in 3-4 weeks time This can be arranged from outpatient visit  Signed: Esmond Plants  M.D., Ph.D. [facility]

## 2015-09-20 NOTE — Discharge Instructions (Addendum)
Apixaban oral tablets °What is this medicine? °APIXABAN (a PIX a ban) is an anticoagulant (blood thinner). It is used to lower the chance of stroke in people with a medical condition called atrial fibrillation. It is also used to treat or prevent blood clots in the lungs or in the veins. °This medicine may be used for other purposes; ask your health care provider or pharmacist if you have questions. °What should I tell my health care provider before I take this medicine? °They need to know if you have any of these conditions: °-bleeding disorders °-bleeding in the brain °-blood in your stools (black or tarry stools) or if you have blood in your vomit °-history of stomach bleeding °-kidney disease °-liver disease °-mechanical heart valve °-an unusual or allergic reaction to apixaban, other medicines, foods, dyes, or preservatives °-pregnant or trying to get pregnant °-breast-feeding °How should I use this medicine? °Take this medicine by mouth with a glass of water. Follow the directions on the prescription label. You can take it with or without food. If it upsets your stomach, take it with food. Take your medicine at regular intervals. Do not take it more often than directed. Do not stop taking except on your doctor's advice. Stopping this medicine may increase your risk of a blot clot. Be sure to refill your prescription before you run out of medicine. °Talk to your pediatrician regarding the use of this medicine in children. Special care may be needed. °Overdosage: If you think you have taken too much of this medicine contact a poison control center or emergency room at once. °NOTE: This medicine is only for you. Do not share this medicine with others. °What if I miss a dose? °If you miss a dose, take it as soon as you can. If it is almost time for your next dose, take only that dose. Do not take double or extra doses. °What may interact with this medicine? °This medicine may interact with the following: °-aspirin  and aspirin-like medicines °-certain medicines for fungal infections like ketoconazole and itraconazole °-certain medicines for seizures like carbamazepine and phenytoin °-certain medicines that treat or prevent blood clots like warfarin, enoxaparin, and dalteparin °-clarithromycin °-NSAIDs, medicines for pain and inflammation, like ibuprofen or naproxen °-rifampin °-ritonavir °-St. John's wort °This list may not describe all possible interactions. Give your health care provider a list of all the medicines, herbs, non-prescription drugs, or dietary supplements you use. Also tell them if you smoke, drink alcohol, or use illegal drugs. Some items may interact with your medicine. °What should I watch for while using this medicine? °Notify your doctor or health care professional and seek emergency treatment if you develop breathing problems; changes in vision; chest pain; severe, sudden headache; pain, swelling, warmth in the leg; trouble speaking; sudden numbness or weakness of the face, arm, or leg. These can be signs that your condition has gotten worse. °If you are going to have surgery, tell your doctor or health care professional that you are taking this medicine. °Tell your health care professional that you use this medicine before you have a spinal or epidural procedure. Sometimes people who take this medicine have bleeding problems around the spine when they have a spinal or epidural procedure. This bleeding is very rare. If you have a spinal or epidural procedure while on this medicine, call your health care professional immediately if you have back pain, numbness or tingling (especially in your legs and feet), muscle weakness, paralysis, or loss of bladder or bowel   control. °Avoid sports and activities that might cause injury while you are using this medicine. Severe falls or injuries can cause unseen bleeding. Be careful when using sharp tools or knives. Consider using an electric razor. Take special care  brushing or flossing your teeth. Report any injuries, bruising, or red spots on the skin to your doctor or health care professional. °What side effects may I notice from receiving this medicine? °Side effects that you should report to your doctor or health care professional as soon as possible: °-allergic reactions like skin rash, itching or hives, swelling of the face, lips, or tongue °-signs and symptoms of bleeding such as bloody or black, tarry stools; red or dark-brown urine; spitting up blood or brown material that looks like coffee grounds; red spots on the skin; unusual bruising or bleeding from the eye, gums, or nose °This list may not describe all possible side effects. Call your doctor for medical advice about side effects. You may report side effects to FDA at 1-800-FDA-1088. °Where should I keep my medicine? °Keep out of the reach of children. °Store at room temperature between 20 and 25 degrees C (68 and 77 degrees F). Throw away any unused medicine after the expiration date. °NOTE: This sheet is a summary. It may not cover all possible information. If you have questions about this medicine, talk to your doctor, pharmacist, or health care provider. °  °© 2016, [employer]vier/Gold Standard. (2013-04-08 11:59:24) ° °

## 2015-09-20 NOTE — Progress Notes (Signed)
Incentive spirometer provided, patient demonstrated correct usage of device and patient and daughter were able to teach back the appropriate frequency of using IS.

## 2015-09-20 NOTE — Care Management (Signed)
It appears that patient will be discharged home on eliquis which would be a new  med.  Discussed during progression the need to assess for need of home 02.  Patient does have dx copd but not on chronic 02.

## 2015-09-20 NOTE — Plan of Care (Signed)
Problem: Activity: Goal: Risk for activity intolerance will decrease Outcome: Progressing Patient sat up in chair for approximately 3 hours today.

## 2015-09-21 ENCOUNTER — Encounter: Payer: Self-pay | Admitting: Student

## 2015-09-21 ENCOUNTER — Telehealth: Payer: Self-pay

## 2015-09-21 ENCOUNTER — Inpatient Hospital Stay: Payer: Medicare Other

## 2015-09-21 LAB — CBC WITH DIFFERENTIAL/PLATELET
Basophils Absolute: 0 10*3/uL (ref 0–0.1)
Basophils Relative: 0 %
Eosinophils Absolute: 0 10*3/uL (ref 0–0.7)
Eosinophils Relative: 0 %
HCT: 37.9 % (ref 35.0–47.0)
Hemoglobin: 12.7 g/dL (ref 12.0–16.0)
Lymphocytes Relative: 5 %
Lymphs Abs: 1.4 10*3/uL (ref 1.0–3.6)
MCH: 27.7 pg (ref 26.0–34.0)
MCHC: 33.4 g/dL (ref 32.0–36.0)
MCV: 82.9 fL (ref 80.0–100.0)
Monocytes Absolute: 1.6 10*3/uL — ABNORMAL HIGH (ref 0.2–0.9)
Monocytes Relative: 6 %
Neutro Abs: 24.4 10*3/uL — ABNORMAL HIGH (ref 1.4–6.5)
Neutrophils Relative %: 89 %
Platelets: 278 10*3/uL (ref 150–440)
RBC: 4.57 MIL/uL (ref 3.80–5.20)
RDW: 14.2 % (ref 11.5–14.5)
WBC: 27.4 10*3/uL — ABNORMAL HIGH (ref 3.6–11.0)

## 2015-09-21 LAB — BASIC METABOLIC PANEL
Anion gap: 9 (ref 5–15)
BUN: 22 mg/dL — ABNORMAL HIGH (ref 6–20)
CO2: 25 mmol/L (ref 22–32)
Calcium: 8.6 mg/dL — ABNORMAL LOW (ref 8.9–10.3)
Chloride: 102 mmol/L (ref 101–111)
Creatinine, Ser: 0.82 mg/dL (ref 0.44–1.00)
GFR calc Af Amer: >60 mL/min (ref >60)
GFR calc non Af Amer: >60 mL/min (ref >60)
Glucose, Bld: 143 mg/dL — ABNORMAL HIGH (ref 65–99)
Potassium: 4.3 mmol/L (ref 3.5–5.1)
Sodium: 136 mmol/L (ref 135–145)

## 2015-09-21 MED ORDER — POLYETHYLENE GLYCOL 3350 17 G PO PACK: 17.0000 g | PACK | Freq: Every day | ORAL | Status: DC | PRN

## 2015-09-21 MED ORDER — SENNOSIDES-DOCUSATE SODIUM 8.6-50 MG PO TABS
2.0000 | ORAL_TABLET | Freq: Two times a day (BID) | ORAL | Status: DC
  Administered 2015-09-21 – 2015-09-23 (×5): 2 via ORAL
  Filled 2015-09-21 (×5): qty 2

## 2015-09-21 MED ORDER — IOHEXOL 350 MG/ML SOLN
75.0000 mL | Freq: Once | INTRAVENOUS | Status: AC | PRN
  Administered 2015-09-21: 75 mL via INTRAVENOUS

## 2015-09-21 MED ORDER — IOHEXOL 350 MG/ML SOLN: 100.0000 mL | Freq: Once | INTRAVENOUS | Status: DC | PRN

## 2015-09-21 MED ORDER — FUROSEMIDE 40 MG PO TABS
40.0000 mg | ORAL_TABLET | Freq: Once | ORAL | Status: AC
  Administered 2015-09-21: 40 mg via ORAL
  Filled 2015-09-21: qty 1

## 2015-09-21 NOTE — Progress Notes (Signed)
Hospital Problem List     Active Problems:   Sepsis [facility])   New onset atrial fibrillation ([place])   Community acquired pneumonia   Elevated troponin   Hypoxia   Encounter for anticoagulation discussion and counseling   Atrial fibrillation [facility])    Patient Profile:   Primary Cardiologist: Dr. Rockey Situ Patient Profile: 82 y.o. female w/ PMH of asthma, prior episodes of PNA, palpitations with no prior formal diagnosis of Afib per patient, and HTN who presented to [facility] on 09/17/2015 from her PCP office with cough, fever and chills and was found to have RLL PNA and be in new onset Afib with RVR.   Subjective   Reports her breathing has improved. Still having coughing episodes. Denies any chest pain or palpitations.  Inpatient Medications    . apixaban  5 mg Oral BID  . calcium-vitamin D  1 tablet Oral Daily  . cholecalciferol  1,000 Units Oral Daily  . diltiazem  180 mg Oral Daily  . dorzolamide  1 drop Both Eyes BID  . gabapentin  400 mg Oral QHS  . guaiFENesin  600 mg Oral BID  . ipratropium-albuterol  3 mL Nebulization TID  . latanoprost  1 drop Both Eyes QHS  . levofloxacin (LEVAQUIN) IV  500 mg Intravenous Q24H  . magnesium oxide  400 mg Oral Daily  . mometasone-formoterol  2 puff Inhalation BID  . omega-3 acid ethyl esters  1,000 mg Oral Daily  . predniSONE  20 mg Oral Q breakfast  . senna-docusate  2 tablet Oral BID  . sodium chloride flush  3 mL Intravenous Q12H  . vitamin C  1,000 mg Oral Daily  . vitamin E  200 Units Oral Daily    Vital Signs    Filed Vitals:   09/20/15 1927 09/20/15 2035 09/21/15 0508 09/21/15 0753  BP: 155/91  134/80   Pulse: 102  92   Temp: 97.4 F (36.3 C)  97.4 F (36.3 C)   TempSrc: Oral  Oral   Resp: 20  22   Height:      Weight:      SpO2: 98% 96% 97% 96%    Intake/Output Summary (Last 24 hours) at 09/21/15 1008 Last data filed at 09/20/15 2022  Gross per 24 hour  Intake    440 ml  Output    175 ml  Net    265 ml   Filed  Weights   09/17/15 1353 09/17/15 1748  Weight: 145 lb (65.772 kg) 158 lb 8 oz (71.895 kg)    Physical Exam    General: Well developed, well nourished, female in no acute distress. Head: Normocephalic, atraumatic. Neck: Supple without bruits, JVD not elevated. Lungs: Resp regular and unlabored, Decreased breath sounds on the right. Heart: Irregularly irregular, S1, S2, no S3, S4, or murmur; no rub. Abdomen: Soft, non-tender, non-distended with normoactive bowel sounds. No hepatomegaly. No rebound/guarding. No obvious abdominal masses. Extremities: No clubbing, cyanosis, or edema. Distal pedal pulses are 2+ bilaterally. Neuro: Alert and oriented X 3. Moves all extremities spontaneously. Psych: Normal affect.  Labs    CBC  Recent Labs  09/20/15 0612 09/21/15 0409  WBC 24.6* 27.4*  NEUTROABS 23.1* 24.4*  HGB 12.2 12.7  HCT 37.2 37.9  MCV 83.1 82.9  PLT 251 0000000   Basic Metabolic Panel  Recent Labs  09/20/15 0612 09/21/15 0409  NA 135 136  K 5.1 4.3  CL 107 102  CO2 23 25  GLUCOSE 193* 143*  BUN 24* 22*  CREATININE 0.80 0.82  CALCIUM 8.5* 8.6*  MG 2.3  --     Telemetry    Atrial fibrillation with rates mostly in 80's - 90's. Peaked into 110's for less than 1 minute. Episodes of 2-3 beats NSVT.  ECG    No new tracings.   Cardiac Studies and Radiology    Dg Chest 2 View: 09/18/2015  CLINICAL DATA:  Fever, chills and worsening right-sided chest pain. EXAM: CHEST  2 VIEW COMPARISON:  CT of the chest 09/17/2015 FINDINGS: Cardiac silhouette is enlarged. Mediastinal contours appear intact. Aorta is torturous and contains atherosclerotic calcifications. There is no evidence of pneumothorax. Right lower lobe subpleural airspace consolidation with small areas of cavitation projects as ill-defined airspace opacity. There are bilateral pleural effusions, right greater than left. Mild bronchiectasis is seen. Osseous structures are without acute abnormality. Soft tissues  are grossly normal. IMPRESSION: Persistent right lower lobe subpleural cavitary mass versus airspace consolidation. Bilateral pleural effusions. There is worsening of the aeration of the lungs when compared to 09/17/2015. Its Enlarged cardiac silhouette, stable. Electronically Signed   By: Fidela Salisbury M.D.   On: 09/18/2015 14:05   Dg Chest 2 View: 09/17/2015  CLINICAL DATA:  Two weeks of nonproductive cough, 2 days of fever and chills, history of asthma, nonsmoker. EXAM: CHEST  2 VIEW COMPARISON:  Chest x-ray of May 23, 2015 FINDINGS: There is new infiltrate in the right lower lobe anteriorly. The left lung is clear. There is a stable subcentimeter nodule in the left lower lobe. The heart and pulmonary vascularity are normal. The mediastinum is normal in width. The bony thorax exhibits no acute abnormality. IMPRESSION: New anterior right lower lobe pneumonia. Underlying reactive airway disease. Followup PA and lateral chest X-ray is recommended in 3-4 weeks following trial of antibiotic therapy to ensure resolution and exclude underlying malignancy. Electronically Signed   By: David  Martinique M.D.   On: 09/17/2015 12:33   Ct Chest Wo Contrast: 09/17/2015  CLINICAL DATA:  Fevers.  Pneumonia. EXAM: CT CHEST WITHOUT CONTRAST TECHNIQUE: Multidetector CT imaging of the chest was performed following the standard protocol without IV contrast. COMPARISON:  Plain film of earlier today and 05/23/2015. No prior CT. FINDINGS: Mediastinum/Nodes: Multiple small left low jugular nodes are nonspecific. Aortic and branch vessel atherosclerosis. Mild cardiomegaly, without pericardial effusion. LAD coronary artery atherosclerosis. Middle and anterior mediastinal adenopathy. A right paratracheal node measures 1.4 cm on image 26/series 2. A node within the azygoesophageal recess measures 2.6 x 1.8 cm on image 32/series 2. A more inferior node within azygoesophageal recess measures 2.0 x 2.9 cm on image 35/series 2. Right  hilar adenopathy at 1.7 cm. Prevascular nodes measure up to 11 mm on image 24/series 2. Lungs/Pleura: Small right pleural effusion. Patchy right lower lobe ground-glass opacities. Right lower lobe pleural-based dense consolidation with surrounding ground-glass opacity. Minimal peripheral cavitation within this area. Example image 44/series 3. Minimal posterior left upper lobe nodularity, including at 4 mm on image 30/series 3. Upper abdomen: Mild hepatic steatosis. Normal imaged portions of the spleen, stomach, pancreas, gallbladder, adrenal glands, kidneys. Musculoskeletal: Mild osteopenia. IMPRESSION: 1. Pleural based right lower lobe consolidation with surrounding ground-glass opacity and minimal cavitation. Given the clinical history, most likely indicative of pneumonia. Given the morphology and position, pulmonary infarct could have a similar appearance. 2. Small right pleural effusion. 3. Significant thoracic adenopathy. Although this could be reactive, given the severity, lymphoproliferative process or metastatic disease would be secondary concerns. This warrants followup  with chest CT at 3 months to confirm resolution. 4.  Atherosclerosis, including within the coronary arteries. 5. Hepatic steatosis. 6. 4 mm left upper lobe pulmonary nodule. If the patient is at high risk for bronchogenic carcinoma, follow-up chest CT at 1 year is recommended. If the patient is at low risk, no follow-up is needed. This recommendation follows the consensus statement: "Guidelines for Management of Small Pulmonary Nodules Detected on CT Scans: A Statement from the [place]" as published in Radiology 2005; 237:395-400. Available online at: https://www.arnold.com/. Electronically Signed   By: Abigail Miyamoto M.D.   On: 09/17/2015 18:18    Echocardiogram: 09/17/2015 Study Conclusions - Procedure narrative: Transthoracic echocardiography. Image quality was poor. The study was  technically difficult, as a result of poor acoustic windows and poor sound wave transmission. - Left ventricle: The cavity size was normal. Systolic function was normal. The estimated ejection fraction was in the range of 60% to 65%. Wall motion was normal; there were no regional wall motion abnormalities. The study is not technically sufficient to allow evaluation of LV diastolic function. - Mitral valve: There was mild regurgitation. - Left atrium: The atrium was mildly dilated. - Right ventricle: Systolic function was normal. - Pulmonary arteries: Systolic pressure was within the normal range.  Impressions: - Rhythm is atrial fibrillation.  Assessment & Plan    1. New onset Atrial Fibrillation with RVR - Likely in the setting of her sepsis 2/2 CAP and UTI. As these improve her Afib is likely to improve as well - Would aim for rate control at this time until the stresses of her sepsis in the setting of CAP and UTI improve as she is not symptomatic with this rhythm and she is unlikely to hold sinus rhythm at this time given the stresses of the above. If she does become symptomatic or her heart rate become difficult to control with rate control could pursue rhythm control vs TEE/DCCV. Otherwise, would need 4 weeks of anticoagulation prior to DCCV. - Would avoid beta blocker usage in the acute setting at this time unless it is necessary given her COPD/asthma and respiratory distress in the setting of her CAP. - Echo showed preserved EF of 60-65%. Mild MR noted along with mildly dilated LA. - Started on Cardizem CD 120 mg daily on 09/18/2015. Increased to Cardizem CD 180mg  daily on 09/19/2015 with improvement in her HR since then. - CHADS2VASc at least 4 (HTN, age x 2, Female). Continue anticoagulation with Eliquis 5mg  BID. Will get Case Management to see for 30-day card and benefits check.  2. Sepsis in the setting of CAP and UTI - WBC elevated to 27.4 on 09/20/2015, previously 24.6  on 09/20/2015.  - On Levaquin per IM. IM planning for repeat CT today given worsening leukocytosis. - Would favor Xopenex over Albuterol given her underlying COPD and asthma.   3. Respiratory distress - Weaned off of [place]. Saturations have been in the high-90's on room air. - Possibly multifactorial including #2, and possible small pleural effusion given her Afib - Planning to ambulate with PT today.  4. Hypokalemia - 4.3 on 09/21/2015 - currently resolved.  5. 4 mm left upper lobe pulmonary nodule.  - Follow-Up CT in 1 year recommended.   Cardiology follow-up has been arranged for 10/05/2015 to discuss possible DCCV following improvement from her PNA.  Signed, Erma Heritage , PA-C 10:08 AM 09/21/2015 Pager: [phone]

## 2015-09-21 NOTE — Progress Notes (Signed)
Physical Therapy Treatment Patient Details Name: Molly Hodges MRN: [id number] DOB: 04-07-1932 Today's Date: 09/21/2015    History of Present Illness presented to ER seconadry to fever, chills and R-sided chest pain; admitted with sepsis secondary to R PNA.  Currently on 2L supplemental O2.  Chest CT negative for PE    PT Comments    Patient with progressive increase in gait distance and overall activity tolerance, but continues to be limited by significant dyspnea with exertion.  Continue to question ability to complete all daily activities required for safe/functional discharge home due to persistent cardiopulmonary endurance deficits; patient/family aware of concerns, continue to prefer HHPT vs. STR   Follow Up Recommendations  SNF (patient currently refusing STR)     Equipment Recommendations       Recommendations for Other Services       Precautions / Restrictions Precautions Precautions: Fall Restrictions Weight Bearing Restrictions: No    Mobility  Bed Mobility Overal bed mobility: Independent                Transfers Overall transfer level: Needs assistance Equipment used: Rolling walker (2 wheeled) Transfers: Sit to/from Stand Sit to Stand: Supervision         General transfer comment: cuing to prevent pulling on RW  Ambulation/Gait Ambulation/Gait assistance: Supervision;Min guard Ambulation Distance (Feet): 70 Feet Assistive device: Rolling walker (2 wheeled)       General Gait Details: reciprocal stepping with slow, but steady, cadence and overall gait speed; distance limited by SOB, but patient with fair ability to monitor fatigue levels and initiate rest period as needed   Stairs            Wheelchair Mobility    Modified Rankin (Stroke Patients Only)       Balance Overall balance assessment: Needs assistance Sitting-balance support: No upper extremity supported;Feet supported Sitting balance-Leahy Scale: Normal      Standing balance support: Bilateral upper extremity supported Standing balance-Leahy Scale: Good                      Cognition Arousal/Alertness: Awake/alert Behavior During Therapy: WFL for tasks assessed/performed Overall Cognitive Status: Within Functional Limits for tasks assessed                      Exercises Other Exercises Other Exercises: Standing LE therex, 1x10, AROM with RW, close sup: heel raises, mini squats.  Unable to tolerate additional therex due to fatigue.    General Comments        Pertinent Vitals/Pain Pain Assessment: No/denies pain    Home Living                      Prior Function            PT Goals (current goals can now be found in the care plan section) Acute Rehab PT Goals Patient Stated Goal: "to try a little" PT Goal Formulation: With patient/family Time For Goal Achievement: 10/02/15 Potential to Achieve Goals: Good Progress towards PT goals: Progressing toward goals    Frequency  Min 2X/week    PT Plan Current plan remains appropriate    Co-evaluation             End of Session Equipment Utilized During Treatment: Gait belt;Oxygen Activity Tolerance: Patient tolerated treatment well Patient left: in bed;with call bell/phone within reach;with bed alarm set;with family/visitor present     Time: [id number] PT Time Calculation (min) (  ACUTE ONLY): 12 min  Charges:  $Gait Training: 8-22 mins                    G Codes:      Hester Joslin H. Owens Shark, PT, DPT, NCS 09/21/2015, 4:43 PM [phone]

## 2015-09-21 NOTE — Progress Notes (Signed)
SATURATION QUALIFICATIONS: (This note is used to comply with regulatory documentation for home oxygen)  Patient Saturations on Room Air at Rest = 96%  Patient Saturations on Room Air while Ambulating = 88%  Patient Saturations on 2 Liters of oxygen while Ambulating = 94%  Please briefly explain why patient needs home oxygen: 

## 2015-09-21 NOTE — Progress Notes (Signed)
PT Cancellation Note  Patient Details Name: Molly Hodges MRN: [id number] DOB: May 27, 1932   Cancelled Treatment:    Reason Eval/Treat Not Completed: Fatigue/lethargy limiting ability to participate (Patient noted negative for PE, cleared for participation with therapy.  Treatment session attempted.  Patient just returned to bed from toileting and declined therapy at this time.  Requested therapist re-attempt at later time/date if possible.  Will continue efforts as appropriate.)   Zeina Akkerman H. Owens Shark, PT, DPT, NCS 09/21/2015, 3:15 PM [phone]

## 2015-09-21 NOTE — Plan of Care (Signed)
Problem: Pain Managment: Goal: General experience of comfort will improve Outcome: Progressing Patient has no complaints of pain.   Problem: Activity: Goal: Risk for activity intolerance will decrease Outcome: Progressing Patient sat up in chair today for several hours, split between this morning and dinner time. Expressed eagerness to get oob. Patient also ambulated with physical therapy today.   Problem: Fluid Volume: Goal: Hemodynamic stability will improve Outcome: Progressing Physician added PO lasix today, urine output is good.  Problem: Physical Regulation: Goal: Signs and symptoms of infection will decrease Outcome: Progressing Patient has been afebrile this shift, HR is controlled 80s-90s. WBCs continue to increase, physician is aware and believes this is likely due to steroid therapy. IV levaquin administered as ordered.

## 2015-09-21 NOTE — Telephone Encounter (Signed)
Attempted to contact pt regarding discharge from [facility] on 09/21/15. Left message asking pt to call back regarding discharge instructions and/or medications. Advised pt of appt w/ Christell Faith, PA on 10/05/15 at 11:00 w/ [facility]. Asked pt to call back if unable to keep this appt.

## 2015-09-21 NOTE — Care Management (Signed)
Patient has qualified for home 02 and per attending there is a strong possibility that patient will discharge over the weekend.  There is a request to provide patient with Eliquis coupon.  Patient is agreeable to home health. [facility] has provided service to a family member. Updated weekend CM of home health orders needed.

## 2015-09-21 NOTE — Progress Notes (Signed)
Pharmacy Antibiotic Note  Molly Hodges is a 82 y.o. female admitted on 09/17/2015 with pneumonia.  Pharmacy has been consulted for levofloxacin dosing.  Plan: This patient's current antibiotics will be continued without adjustments.  Height: 5\' 5"  (165.1 cm) Weight: 158 lb 8 oz (71.895 kg) IBW/kg (Calculated) : 57  Temp (24hrs), Avg:97.6 F (36.4 C), Min:97.4 F (36.3 C), Max:97.9 F (36.6 C)   Recent Labs Lab 09/17/15 1432 09/18/15 0603 09/19/15 0713 09/20/15 0612 09/21/15 0409  WBC 21.2* 17.4* 18.6* 24.6* 27.4*  CREATININE 1.00 0.82 0.72 0.80 0.82  LATICACIDVEN 1.8  --   --   --   --     Estimated Creatinine Clearance: 53.5 mL/min (by C-G formula based on Cr of 0.82).    Allergies  Allergen Reactions  . Shellfish Allergy Anaphylaxis  . Azithromycin Other (See Comments)    Extreme burning sensation at IV site  . Tamiflu [Oseltamivir Phosphate] Other (See Comments)    Reaction:  Hallucinations     Antimicrobials this admission: Anti-infectives    Start     Dose/Rate Route Frequency Ordered Stop   09/18/15 1900  azithromycin (ZITHROMAX) 500 mg in dextrose 5 % 250 mL IVPB  Status:  Discontinued     500 mg 250 mL/hr over 60 Minutes Intravenous Every 24 hours 09/17/15 1452 09/17/15 1550   09/18/15 1800  cefTRIAXone (ROCEPHIN) 1 g in dextrose 5 % 50 mL IVPB  Status:  Discontinued     1 g 100 mL/hr over 30 Minutes Intravenous Every 24 hours 09/17/15 1452 09/17/15 1837   09/18/15 1800  Levofloxacin (LEVAQUIN) IVPB 250 mg  Status:  Discontinued     250 mg 50 mL/hr over 60 Minutes Intravenous Every 24 hours 09/17/15 1714 09/18/15 1242   09/18/15 1800  levofloxacin (LEVAQUIN) IVPB 500 mg     500 mg 100 mL/hr over 60 Minutes Intravenous Every 24 hours 09/18/15 1242     09/17/15 1600  levofloxacin (LEVAQUIN) IVPB 750 mg     750 mg 100 mL/hr over 90 Minutes Intravenous  Once 09/17/15 1550 09/17/15 1749   09/17/15 1445  cefTRIAXone (ROCEPHIN) 1 g in dextrose 5 % 50 mL  IVPB     1 g 100 mL/hr over 30 Minutes Intravenous  Once 09/17/15 1431 09/17/15 1536   09/17/15 1445  azithromycin (ZITHROMAX) 500 mg in dextrose 5 % 250 mL IVPB  Status:  Discontinued     500 mg 250 mL/hr over 60 Minutes Intravenous  Once 09/17/15 1431 09/17/15 1550      Dose adjustments this admission: Continue Levofloxacin 500 mg IV q24h.   Microbiology results: Results for orders placed or performed during the hospital encounter of 09/17/15  Blood Culture (routine x 2)     Status: None (Preliminary result)   Collection Time: 09/17/15 10:43 AM  Result Value Ref Range Status   Specimen Description BLOOD LEFT FATTY CASTS  Final   Special Requests BOTTLES DRAWN AEROBIC AND ANAEROBIC 5CC  Final   Culture NO GROWTH 4 DAYS  Final   Report Status PENDING  Incomplete  Blood Culture (routine x 2)     Status: None (Preliminary result)   Collection Time: 09/17/15  2:32 PM  Result Value Ref Range Status   Specimen Description BLOOD RIGHT FATTY CASTS  Final   Special Requests BOTTLES DRAWN AEROBIC AND ANAEROBIC [place]  Final   Culture NO GROWTH 4 DAYS  Final   Report Status PENDING  Incomplete  Urine culture  Status: None   Collection Time: 09/17/15  6:41 PM  Result Value Ref Range Status   Specimen Description URINE, RANDOM  Final   Special Requests NONE  Final   Culture 6,000 COLONIES/mL INSIGNIFICANT GROWTH  Final   Report Status 09/19/2015 FINAL  Final   Thank you for allowing pharmacy to be a part of this patient's care.  Quincy Boy G 09/21/2015 12:06 PM

## 2015-09-21 NOTE — Care Management Important Message (Signed)
Important Message  Patient Details  Name: Molly Hodges MRN: [id number] Date of Birth: 1932/05/12   Medicare Important Message Given:  Yes    Juliann Pulse A Sulay Brymer 09/21/2015, 1:39 PM

## 2015-09-21 NOTE — Progress Notes (Signed)
[place] at [place] NAME: Molly Hodges    MR#:  [id number]  DATE OF BIRTH:  03/27/1932  SUBJECTIVE:  CHIEF COMPLAINT:   Chief Complaint  Patient presents with  . Fever   SOB still present on ambulation Still has Right pleuritic chest pain. Improved Tele shows Afib, rate controlled. Afebrile. Not needing oxygen. Feels weak.  REVIEW OF SYSTEMS:    Review of Systems  Constitutional: Positive for malaise/fatigue. Negative for fever and chills.  HENT: Negative for sore throat.   Eyes: Negative for blurred vision, double vision and pain.  Respiratory: Positive for cough, shortness of breath and wheezing. Negative for hemoptysis.   Cardiovascular: Positive for chest pain. Negative for palpitations, orthopnea and leg swelling.  Gastrointestinal: Negative for heartburn, nausea, vomiting, abdominal pain, diarrhea and constipation.  Genitourinary: Negative for dysuria and hematuria.  Musculoskeletal: Negative for back pain and joint pain.  Skin: Negative for rash.  Neurological: Positive for weakness. Negative for sensory change, speech change, focal weakness and headaches.  Endo/Heme/Allergies: Does not bruise/bleed easily.  Psychiatric/Behavioral: Negative for depression. The patient is nervous/anxious.       DRUG ALLERGIES:   Allergies  Allergen Reactions  . Shellfish Allergy Anaphylaxis  . Azithromycin Other (See Comments)    Extreme burning sensation at IV site  . Tamiflu [Oseltamivir Phosphate] Other (See Comments)    Reaction:  Hallucinations     VITALS:  Blood pressure 142/70, pulse 93, temperature 97.9 F (36.6 C), temperature source Oral, resp. rate 20, height 5\' 5"  (1.651 m), weight 71.895 kg (158 lb 8 oz), SpO2 96 %.  PHYSICAL EXAMINATION:   Physical Exam  GENERAL:  82 y.o.-year-old patient lying in the bed with conversational dyspnea EYES: Pupils equal, round, reactive to light and accommodation. No  scleral icterus. Extraocular muscles intact.  HEENT: Head atraumatic, normocephalic. Oropharynx and nasopharynx clear.  NECK:  Supple, no jugular venous distention. No thyroid enlargement, no tenderness.  LUNGS: Decreased breath sounds. Wheezing improved.  CARDIOVASCULAR: S1, S2 normal. No murmurs, rubs, or gallops.  ABDOMEN: Soft, nontender, nondistended. Bowel sounds present. No organomegaly or mass.  EXTREMITIES: No cyanosis, clubbing or edema b/l.  NEUROLOGIC: Cranial nerves II through XII are intact. No focal Motor or sensory deficits b/l.   PSYCHIATRIC: The patient is alert and oriented x 3. SKIN: No obvious rash, lesion, or ulcer.    LABORATORY PANEL:   CBC  Recent Labs Lab 09/21/15 0409  WBC 27.4*  HGB 12.7  HCT 37.9  PLT 278   ------------------------------------------------------------------------------------------------------------------  Chemistries   Recent Labs Lab 09/17/15 1432  09/20/15 0612 09/21/15 0409  NA 134*  < > 135 136  K 3.4*  < > 5.1 4.3  CL 103  < > 107 102  CO2 21*  < > 23 25  GLUCOSE 160*  < > 193* 143*  BUN 13  < > 24* 22*  CREATININE 1.00  < > 0.80 0.82  CALCIUM 8.4*  < > 8.5* 8.6*  MG  --   --  2.3  --   AST 20  --   --   --   ALT 20  --   --   --   ALKPHOS 70  --   --   --   BILITOT 1.0  --   --   --   < > = values in this interval not displayed. ------------------------------------------------------------------------------------------------------------------  Cardiac Enzymes  Recent Labs Lab 09/18/15 0603  TROPONINI 0.05*   ------------------------------------------------------------------------------------------------------------------  RADIOLOGY:  No results found.   ASSESSMENT AND PLAN:   Molly Hodges is a 82 y.o. female with a known history of hypertension, COPD not on home oxygen and prior history of pneumonia presents to the hospital secondary to fevers chills and worsening right-sided chest pain.  # RLL  PNA with COPD exacerbation and Acute hypoxic respiratory failure and sepsis - Improving -IV steroids, Antibiotics - Scheduled Nebulizers - Inhalers - Added Mucinex. Ordered flutter valve.  # Worsening leukocytosis : Be due to pneumonia or steroids.  Will check CTA of the chest. Initial CT scan on admission raise concern for pulmonary infarct or cavitation. Will need to rule out pulmonary embolism or abscess at this point due to worsening leukocytosis. Further antibiotic changes as per CT scan findings.  # Atrial fibrillation-history of paroxysmal A. fib happened once, never had any further issues with that. Rate controlled -Likely triggered by sepsis. - PO Cardizem - cardiology consulted.  - ECHO - EF 65% - Minimal elevation of troponin Started on Eliquis.  # hypertension Hold the hydrochlorothiazide On PO cardizem  # hypokalemia- replaced  # DVT prophylaxis-on Lovenox  All the records are reviewed and case discussed with Care Management/Social Workerr. Management plans discussed with the patient, family and they are in agreement.  CODE STATUS: FULL  DVT Prophylaxis: SCDs  TOTAL TIME TAKING CARE OF THIS PATIENT: 35 minutes.   Possible discharge in 2-3 days.   Hillary Bow R M.D on 09/21/2015 at 11:05 AM  Between 7am to 6pm - Pager - [phone]  After 6pm go to [email or web address] - password [email or web address] [facility]  Office  [phone]  CC: Primary care physician; Lelon Huh, MD    Note: This dictation was prepared with Dragon dictation along with smaller phrase technology. Any transcriptional errors that result from this process are unintentional.

## 2015-09-21 NOTE — Progress Notes (Signed)
PT Cancellation Note  Patient Details Name: Molly Hodges MRN: [id number] DOB: 11/22/1931   Cancelled Treatment:    Reason Eval/Treat Not Completed: Medical issues which prohibited therapy (Treatment session attempted. Patient noted with orders for chest CT to rule out PE; will hold until results received and patient cleared for activity.  RN informed/aware.)   Reyes Ivan. Owens Shark, PT, DPT, NCS 09/21/2015, 12:11 PM [phone]

## 2015-09-21 NOTE — Telephone Encounter (Signed)
-----   Message from Blain Pais sent at 09/21/2015 10:35 AM EST ----- Regarding: tcm/ph 10/05/2015 11am Christell Faith, PA-C

## 2015-09-22 LAB — CBC WITH DIFFERENTIAL/PLATELET
Band Neutrophils: 3 %
Basophils Absolute: 0 10*3/uL (ref 0–0.1)
Basophils Relative: 0 %
Blasts: 0 %
Eosinophils Absolute: 0 10*3/uL (ref 0–0.7)
Eosinophils Relative: 0 %
HCT: 42.2 % (ref 35.0–47.0)
Hemoglobin: 13.9 g/dL (ref 12.0–16.0)
Lymphocytes Relative: 15 %
Lymphs Abs: 4.3 10*3/uL — ABNORMAL HIGH (ref 1.0–3.6)
MCH: 27.5 pg (ref 26.0–34.0)
MCHC: 32.9 g/dL (ref 32.0–36.0)
MCV: 83.4 fL (ref 80.0–100.0)
Metamyelocytes Relative: 2 %
Monocytes Absolute: 1.7 10*3/uL — ABNORMAL HIGH (ref 0.2–0.9)
Monocytes Relative: 6 %
Myelocytes: 3 %
Neutro Abs: 22.5 10*3/uL — ABNORMAL HIGH (ref 1.4–6.5)
Neutrophils Relative %: 71 %
Other: 0 %
Platelets: 343 10*3/uL (ref 150–440)
Promyelocytes Absolute: 0 %
RBC: 5.06 MIL/uL (ref 3.80–5.20)
RDW: 13.8 % (ref 11.5–14.5)
WBC: 28.5 10*3/uL — ABNORMAL HIGH (ref 3.6–11.0)
nRBC: 0 /100 WBC

## 2015-09-22 LAB — CULTURE, BLOOD (ROUTINE X 2)
Culture: NO GROWTH
Culture: NO GROWTH

## 2015-09-22 LAB — BASIC METABOLIC PANEL
Anion gap: 8 (ref 5–15)
BUN: 22 mg/dL — ABNORMAL HIGH (ref 6–20)
CO2: 28 mmol/L (ref 22–32)
Calcium: 8.6 mg/dL — ABNORMAL LOW (ref 8.9–10.3)
Chloride: 102 mmol/L (ref 101–111)
Creatinine, Ser: 0.95 mg/dL (ref 0.44–1.00)
GFR calc Af Amer: >60 mL/min (ref >60)
GFR calc non Af Amer: 55 mL/min — ABNORMAL LOW (ref >60)
Glucose, Bld: 118 mg/dL — ABNORMAL HIGH (ref 65–99)
Potassium: 4 mmol/L (ref 3.5–5.1)
Sodium: 138 mmol/L (ref 135–145)

## 2015-09-22 MED ORDER — IPRATROPIUM-ALBUTEROL 0.5-2.5 (3) MG/3ML IN SOLN
3.0000 mL | Freq: Four times a day (QID) | RESPIRATORY_TRACT | Status: DC | PRN
  Administered 2015-09-22: 3 mL via RESPIRATORY_TRACT
  Filled 2015-09-22: qty 3

## 2015-09-22 MED ORDER — LEVOFLOXACIN IN D5W 250 MG/50ML IV SOLN
250.0000 mg | INTRAVENOUS | Status: DC
  Administered 2015-09-22: 250 mg via INTRAVENOUS
  Filled 2015-09-22 (×2): qty 50

## 2015-09-22 NOTE — Plan of Care (Signed)
Problem: Activity: Goal: Risk for activity intolerance will decrease Outcome: Progressing Patient sat up in chair for several hours today. She also ambulated from her room to room 260 and back with the aide as well. Tolerated both well. No complaints of shortness of breath or dizziness.

## 2015-09-22 NOTE — Progress Notes (Signed)
Pharmacy Antibiotic Note  Molly Hodges is a 82 y.o. female admitted on 09/17/2015 with pneumonia.  Pharmacy has been consulted for levofloxacin dosing.  Plan: The dose of levofloxacin 500mg  will be adjusted to levofloxacin 250mg  IV q24hrs based on renal function.   Patient's renal function hovering around the 77ml/min cut off. Pharmacy will continue to monitor and adjust dose as needed.  Height: 5\' 5"  (165.1 cm) Weight: 158 lb 8 oz (71.895 kg) IBW/kg (Calculated) : 57  Temp (24hrs), Avg:97.9 F (36.6 C), Min:97.9 F (36.6 C), Max:98 F (36.7 C)   Recent Labs Lab 09/17/15 1432 09/18/15 0603 09/19/15 0713 09/20/15 0612 09/21/15 0409 09/22/15 0410  WBC 21.2* 17.4* 18.6* 24.6* 27.4* 28.5*  CREATININE 1.00 0.82 0.72 0.80 0.82 0.95  LATICACIDVEN 1.8  --   --   --   --   --     Estimated Creatinine Clearance: 46.2 mL/min (by C-G formula based on Cr of 0.95).    Allergies  Allergen Reactions  . Shellfish Allergy Anaphylaxis  . Azithromycin Other (See Comments)    Extreme burning sensation at IV site  . Tamiflu [Oseltamivir Phosphate] Other (See Comments)    Reaction:  Hallucinations     Antimicrobials this admission: Anti-infectives    Start     Dose/Rate Route Frequency Ordered Stop   09/22/15 1800  Levofloxacin (LEVAQUIN) IVPB 250 mg     250 mg 50 mL/hr over 60 Minutes Intravenous Every 24 hours 09/22/15 1047     09/18/15 1900  azithromycin (ZITHROMAX) 500 mg in dextrose 5 % 250 mL IVPB  Status:  Discontinued     500 mg 250 mL/hr over 60 Minutes Intravenous Every 24 hours 09/17/15 1452 09/17/15 1550   09/18/15 1800  cefTRIAXone (ROCEPHIN) 1 g in dextrose 5 % 50 mL IVPB  Status:  Discontinued     1 g 100 mL/hr over 30 Minutes Intravenous Every 24 hours 09/17/15 1452 09/17/15 1837   09/18/15 1800  Levofloxacin (LEVAQUIN) IVPB 250 mg  Status:  Discontinued     250 mg 50 mL/hr over 60 Minutes Intravenous Every 24 hours 09/17/15 1714 09/18/15 1242   09/18/15 1800   levofloxacin (LEVAQUIN) IVPB 500 mg  Status:  Discontinued     500 mg 100 mL/hr over 60 Minutes Intravenous Every 24 hours 09/18/15 1242 09/22/15 1047   09/17/15 1600  levofloxacin (LEVAQUIN) IVPB 750 mg     750 mg 100 mL/hr over 90 Minutes Intravenous  Once 09/17/15 1550 09/17/15 1749   09/17/15 1445  cefTRIAXone (ROCEPHIN) 1 g in dextrose 5 % 50 mL IVPB     1 g 100 mL/hr over 30 Minutes Intravenous  Once 09/17/15 1431 09/17/15 1536   09/17/15 1445  azithromycin (ZITHROMAX) 500 mg in dextrose 5 % 250 mL IVPB  Status:  Discontinued     500 mg 250 mL/hr over 60 Minutes Intravenous  Once 09/17/15 1431 09/17/15 1550      Dose adjustments this admission: Continue Levofloxacin 500 mg IV q24h.   Microbiology results: Results for orders placed or performed during the hospital encounter of 09/17/15  Blood Culture (routine x 2)     Status: None   Collection Time: 09/17/15 10:43 AM  Result Value Ref Range Status   Specimen Description BLOOD LEFT FATTY CASTS  Final   Special Requests BOTTLES DRAWN AEROBIC AND ANAEROBIC 5CC  Final   Culture NO GROWTH 5 DAYS  Final   Report Status 09/22/2015 FINAL  Final  Blood Culture (routine x  2)     Status: None   Collection Time: 09/17/15  2:32 PM  Result Value Ref Range Status   Specimen Description BLOOD RIGHT FATTY CASTS  Final   Special Requests BOTTLES DRAWN AEROBIC AND ANAEROBIC [place]  Final   Culture NO GROWTH 5 DAYS  Final   Report Status 09/22/2015 FINAL  Final  Urine culture     Status: None   Collection Time: 09/17/15  6:41 PM  Result Value Ref Range Status   Specimen Description URINE, RANDOM  Final   Special Requests NONE  Final   Culture 6,000 COLONIES/mL INSIGNIFICANT GROWTH  Final   Report Status 09/19/2015 FINAL  Final   Thank you for allowing pharmacy to be a part of this patient's care.  Roe Coombs, PharmD Pharmacy Resident 09/22/2015

## 2015-09-22 NOTE — Progress Notes (Signed)
[place] at [place] NAME: Molly Hodges    MR#:  [id number]  DATE OF BIRTH:  25-May-1932  SUBJECTIVE:   Patient slept very well last night. Her symptoms have improved.  REVIEW OF SYSTEMS:    Review of Systems  Constitutional: Negative for fever, chills and malaise/fatigue.  HENT: Negative for sore throat.   Eyes: Negative for blurred vision.  Respiratory: Positive for cough. Negative for hemoptysis, shortness of breath and wheezing.   Cardiovascular: Negative for chest pain, palpitations and leg swelling.  Gastrointestinal: Negative for nausea, vomiting, abdominal pain, diarrhea and blood in stool.  Genitourinary: Negative for dysuria.  Musculoskeletal: Negative for back pain.  Neurological: Negative for dizziness, tremors and headaches.  Endo/Heme/Allergies: Does not bruise/bleed easily.    Tolerating Diet: yes      DRUG ALLERGIES:   Allergies  Allergen Reactions  . Shellfish Allergy Anaphylaxis  . Azithromycin Other (See Comments)    Extreme burning sensation at IV site  . Tamiflu [Oseltamivir Phosphate] Other (See Comments)    Reaction:  Hallucinations     VITALS:  Blood pressure 122/74, pulse 102, temperature 97.9 F (36.6 C), temperature source Oral, resp. rate 16, height 5\' 5"  (1.651 m), weight 71.895 kg (158 lb 8 oz), SpO2 95 %.  PHYSICAL EXAMINATION:   Physical Exam  Constitutional: She is oriented to person, place, and time and well-developed, well-nourished, and in no distress. No distress.  HENT:  Head: Normocephalic.  Eyes: No scleral icterus.  Neck: Normal range of motion. Neck supple. No JVD present. No tracheal deviation present.  Cardiovascular: Normal rate, regular rhythm and normal heart sounds.  Exam reveals no gallop and no friction rub.   No murmur heard. Pulmonary/Chest: Effort normal and breath sounds normal. No respiratory distress. She has no wheezes. She has no rales. She exhibits no  tenderness.  Abdominal: Soft. Bowel sounds are normal. She exhibits no distension and no mass. There is no tenderness. There is no rebound and no guarding.  Musculoskeletal: Normal range of motion. She exhibits no edema.  Neurological: She is alert and oriented to person, place, and time.  Skin: Skin is warm. No rash noted. No erythema.  Psychiatric: Affect and judgment normal.      LABORATORY PANEL:   CBC  Recent Labs Lab 09/22/15 0410  WBC 28.5*  HGB 13.9  HCT 42.2  PLT 343   ------------------------------------------------------------------------------------------------------------------  Chemistries   Recent Labs Lab 09/17/15 1432  09/20/15 0612  09/22/15 0410  NA 134*  < > 135  < > 138  K 3.4*  < > 5.1  < > 4.0  CL 103  < > 107  < > 102  CO2 21*  < > 23  < > 28  GLUCOSE 160*  < > 193*  < > 118*  BUN 13  < > 24*  < > 22*  CREATININE 1.00  < > 0.80  < > 0.95  CALCIUM 8.4*  < > 8.5*  < > 8.6*  MG  --   --  2.3  --   --   AST 20  --   --   --   --   ALT 20  --   --   --   --   ALKPHOS 70  --   --   --   --   BILITOT 1.0  --   --   --   --   < > = values in  this interval not displayed. ------------------------------------------------------------------------------------------------------------------  Cardiac Enzymes  Recent Labs Lab 09/17/15 1730 09/17/15 2305 09/18/15 0603  TROPONINI 0.07* 0.20* 0.05*   ------------------------------------------------------------------------------------------------------------------  RADIOLOGY:  Ct Angio Chest Pe W/cm &/or Wo Cm  09/21/2015  CLINICAL DATA:  Progressive shortness of breath EXAM: CT ANGIOGRAPHY CHEST WITH CONTRAST TECHNIQUE: Multidetector CT imaging of the chest was performed using the standard protocol during bolus administration of intravenous contrast. Multiplanar CT image reconstructions and MIPs were obtained to evaluate the vascular anatomy. CONTRAST:  60mL OMNIPAQUE IOHEXOL 350 MG/ML SOLN COMPARISON:   09/17/2015. FINDINGS: Mediastinum: Heart size is normal. No pericardial effusion. Calcification involving the thoracic aorta noted. LAD coronary artery and RCA coronary artery calcifications noted. The trachea appears patent and is midline. Normal appearance of the esophagus. Multiple small (less than 1 cm) mediastinal lymph nodes are identified. No adenopathy. The main pulmonary artery appears patent. No lobar or segmental pulmonary artery filling defects identified. Lungs/Pleura: There are bilateral pleural effusions right greater in left. Right lower lobe airspace consolidation is identified, worrisome for pneumonia. Mild changes of paraseptal emphysema noted. Calcified granuloma noted in the left lower lobe. Upper Abdomen: The visualized portions of the liver are normal. The spleen is unremarkable. The visualized portions of the adrenal glands and left kidney normal. Musculoskeletal: Degenerative disc disease is present throughout the thoracic spine. No aggressive lytic or sclerotic bone lesions. Review of the MIP images confirms the above findings. IMPRESSION: 1. No acute pulmonary embolus. 2. Bilateral pleural effusions are identified. There is airspace consolidation in the right lower lobe compatible with pneumonia. Followup PA and lateral chest X-ray is recommended in 3-4 weeks following trial of antibiotic therapy to ensure resolution and exclude underlying malignancy. 3. Aortic atherosclerosis and coronary artery calcifications. 4. Mild  paraseptal emphysema. Electronically Signed   By: Kerby Moors M.D.   On: 09/21/2015 13:25     ASSESSMENT AND PLAN:   82 year old female with a history of hypertension and COPD who presented with fever and found to have pneumonia.   1. Sepsis: Chest x-ray is consistent with right-sided infiltrate. CT scan also shows right-sided consolidation compatible with pneumonia. This was negative for pulmonary emboli. Continue Levaquin renally dosed.  Follow-up chest  x-ray in 3-4 weeks.   2. Atrial Fibrillation: Appreciate cardiology consult. Continue L Aquinas and diltiazem for heart rate control. Goal will be 3-4 weeks of anticoagulation and then follow-up with cardiology for possible cardioversion.   3. Essential hypertension: Continue Cardizem  4. Leukocytosis: White blood cell is increasing but patient has no fever. CT scan does not show cavitary lesion. This is likely due to steroids. Continue to monitor. Management plans discussed with the patient and she is in agreement.  CODE STATUS: FULL  TOTAL TIME TAKING CARE OF THIS PATIENT: 28 minutes.   Physical therapy is recommending skilled nursing facility. Clinical social work consult has been placed.  POSSIBLE D/C tomorrow, DEPENDING ON CLINICAL CONDITION.   Verdun Rackley M.D on 09/22/2015 at 11:16 AM  Between 7am to 6pm - Pager - [phone] After 6pm go to [email or web address] - password [email or web address] [facility]  Office  [phone]  CC: Primary care physician; Lelon Huh, MD  Note: This dictation was prepared with Dragon dictation along with smaller phrase technology. Any transcriptional errors that result from this process are unintentional.

## 2015-09-23 LAB — BASIC METABOLIC PANEL
Anion gap: 7 (ref 5–15)
BUN: 21 mg/dL — ABNORMAL HIGH (ref 6–20)
CO2: 29 mmol/L (ref 22–32)
Calcium: 8 mg/dL — ABNORMAL LOW (ref 8.9–10.3)
Chloride: 100 mmol/L — ABNORMAL LOW (ref 101–111)
Creatinine, Ser: 0.74 mg/dL (ref 0.44–1.00)
GFR calc Af Amer: >60 mL/min (ref >60)
GFR calc non Af Amer: >60 mL/min (ref >60)
Glucose, Bld: 127 mg/dL — ABNORMAL HIGH (ref 65–99)
Potassium: 3.6 mmol/L (ref 3.5–5.1)
Sodium: 136 mmol/L (ref 135–145)

## 2015-09-23 LAB — CBC
HCT: 40.5 % (ref 35.0–47.0)
Hemoglobin: 13.5 g/dL (ref 12.0–16.0)
MCH: 27.7 pg (ref 26.0–34.0)
MCHC: 33.3 g/dL (ref 32.0–36.0)
MCV: 83.2 fL (ref 80.0–100.0)
Platelets: 309 10*3/uL (ref 150–440)
RBC: 4.87 MIL/uL (ref 3.80–5.20)
RDW: 13.8 % (ref 11.5–14.5)
WBC: 19.6 10*3/uL — ABNORMAL HIGH (ref 3.6–11.0)

## 2015-09-23 MED ORDER — APIXABAN 5 MG PO TABS: 5.0000 mg | ORAL_TABLET | Freq: Two times a day (BID) | ORAL | Status: DC

## 2015-09-23 MED ORDER — PREDNISONE 10 MG PO TABS: 10.0000 mg | ORAL_TABLET | Freq: Every day | ORAL | Status: AC

## 2015-09-23 MED ORDER — LEVOFLOXACIN 250 MG PO TABS: 250.0000 mg | ORAL_TABLET | Freq: Every day | ORAL | Status: DC

## 2015-09-23 MED ORDER — DILTIAZEM HCL ER COATED BEADS 180 MG PO CP24: 180.0000 mg | ORAL_CAPSULE | Freq: Every day | ORAL | Status: DC

## 2015-09-23 NOTE — Discharge Summary (Signed)
[facility] [place] NAME: Molly Hodges    MR#:  [id number]  DATE OF BIRTH:  Dec 29, 1931  DATE OF ADMISSION:  09/17/2015 ADMITTING PHYSICIAN: Gladstone Lighter, MD  DATE OF DISCHARGE: 09/23/2015 PRIMARY CARE PHYSICIAN: Lelon Huh, MD    ADMISSION DIAGNOSIS:  Pneumonia [J18.9] Community acquired pneumonia [J18.9] Elevated troponin [R79.89] Medication side effect, initial encounter [T88.7XXA] Sepsis, due to unspecified organism ([place]) [A41.9] Atrial fibrillation, unspecified type ([place]) [I48.91]  DISCHARGE DIAGNOSIS:  Active Problems:   Sepsis ([place])   New onset atrial fibrillation ([place])   Community acquired pneumonia   Elevated troponin   Hypoxia   Encounter for anticoagulation discussion and counseling   Atrial fibrillation ([place])   SECONDARY DIAGNOSIS:   Past Medical History  Diagnosis Date  . Pneumonia   . Hypertension   . Palpitation   . COPD (chronic obstructive pulmonary disease) ([place])   . Asthma   . Peripheral neuropathy ([facility])     in both feet  . New onset atrial fibrillation ([place]) 09/17/2015    a. On Eliquis    HOSPITAL COURSE:  82 year old female with a history of hypertension and COPD who presented with fever and found to have pneumonia.   1. Sepsis: Chest x-ray is consistent with right-sided infiltrate. CT scan also shows right-sided consolidation compatible with pneumonia. CT scan was negative for pulmonary emboli.  She will continue Levaquin for 4 more days after discharge.  She needs a Follow-up chest x-ray in 3-4 weeks.   2. Atrial Fibrillation: Appreciate cardiology consult. Continue ELIQUIS and diltiazem for heart rate control. Goal will be 3-4 weeks of anticoagulation and then follow-up with cardiology for possible cardioversion.   3. Essential hypertension: Continue Cardizem  4. Leukocytosis: White blood cell count has improved. I am suspecting elevation in white blood cell count was due to  steroids.    DISCHARGE CONDITIONS AND DIET:   Patient is stable for discharge on a heart healthy diet  CONSULTS OBTAINED:  Treatment Team:  Minna Merritts, MD  DRUG ALLERGIES:   Allergies  Allergen Reactions  . Shellfish Allergy Anaphylaxis  . Azithromycin Other (See Comments)    Extreme burning sensation at IV site  . Tamiflu [Oseltamivir Phosphate] Other (See Comments)    Reaction:  Hallucinations     DISCHARGE MEDICATIONS:   Current Discharge Medication List    START taking these medications   Details  apixaban (ELIQUIS) 5 MG TABS tablet Take 1 tablet (5 mg total) by mouth 2 (two) times daily. Qty: 60 tablet, Refills: 0    diltiazem (CARDIZEM CD) 180 MG 24 hr capsule Take 1 capsule (180 mg total) by mouth daily. Qty: 30 capsule, Refills: 0    levofloxacin (LEVAQUIN) 250 MG tablet Take 1 tablet (250 mg total) by mouth daily. Qty: 4 tablet, Refills: 0    predniSONE (DELTASONE) 10 MG tablet Take 1 tablet (10 mg total) by mouth daily with breakfast. For 3 days then stop Qty: 3 tablet, Refills: 0      CONTINUE these medications which have NOT CHANGED   Details  Calcium Carbonate-Vitamin D (CALCIUM 600+D) 600-400 MG-UNIT tablet Take 1 tablet by mouth daily.    cholecalciferol (VITAMIN D) 1000 UNITS tablet Take 1,000 Units by mouth daily.    dorzolamide (TRUSOPT) 2 % ophthalmic solution Place 1 drop into both eyes 2 (two) times daily.     Fluticasone-Salmeterol (ADVAIR DISKUS) 250-50 MCG/DOSE AEPB Inhale 1 puff into the lungs 2 (two) times daily.  gabapentin (NEURONTIN) 100 MG capsule Take 400 mg by mouth at bedtime.    hydrochlorothiazide (MICROZIDE) 12.5 MG capsule Take 12.5 mg by mouth daily.    latanoprost (XALATAN) 0.005 % ophthalmic solution Place 1 drop into both eyes at bedtime.     magnesium oxide (MAG-OX) 400 MG tablet Take 400 mg by mouth daily.    Omega-3 Fatty Acids (FISH OIL BURP-LESS) 1000 MG CAPS Take 1,000 mg by mouth daily.     vitamin C  (ASCORBIC ACID) 500 MG tablet Take 1,000 mg by mouth daily.    vitamin E 100 UNIT capsule Take 200 Units by mouth daily.              Today   CHIEF COMPLAINT:   Patient is doing well this morning. She does not want to go to skilled nursing facility but wants to go home at discharge. She has family surrounded by her.  VITAL SIGNS:  Blood pressure 116/45, pulse 91, temperature 97.9 F (36.6 C), temperature source Oral, resp. rate 16, height 5\' 5"  (1.651 m), weight 71.895 kg (158 lb 8 oz), SpO2 94 %.   REVIEW OF SYSTEMS:  Review of Systems  Constitutional: Negative for fever, chills and malaise/fatigue.  HENT: Negative for sore throat.   Eyes: Negative for blurred vision.  Respiratory: Negative for cough, hemoptysis, shortness of breath and wheezing.   Cardiovascular: Negative for chest pain, palpitations and leg swelling.  Gastrointestinal: Negative for nausea, vomiting, abdominal pain, diarrhea and blood in stool.  Genitourinary: Negative for dysuria.  Musculoskeletal: Negative for back pain.  Neurological: Negative for dizziness, tremors and headaches.  Endo/Heme/Allergies: Does not bruise/bleed easily.     PHYSICAL EXAMINATION:  GENERAL:  82 y.o.-year-old patient lying in the bed with no acute distress.  NECK:  Supple, no jugular venous distention. No thyroid enlargement, no tenderness.  LUNGS: Normal breath sounds bilaterally, no wheezing, rales,rhonchi  No use of accessory muscles of respiration.  CARDIOVASCULAR: S1, S2 normal. No murmurs, rubs, or gallops.  ABDOMEN: Soft, non-tender, non-distended. Bowel sounds present. No organomegaly or mass.  EXTREMITIES: No pedal edema, cyanosis, or clubbing.  PSYCHIATRIC: The patient is alert and oriented x 3.  SKIN: No obvious rash, lesion, or ulcer.   DATA REVIEW:   CBC  Recent Labs Lab 09/23/15 0437  WBC 19.6*  HGB 13.5  HCT 40.5  PLT 309    Chemistries   Recent Labs Lab 09/17/15 1432  09/20/15 0612   09/23/15 0437  NA 134*  < > 135  < > 136  K 3.4*  < > 5.1  < > 3.6  CL 103  < > 107  < > 100*  CO2 21*  < > 23  < > 29  GLUCOSE 160*  < > 193*  < > 127*  BUN 13  < > 24*  < > 21*  CREATININE 1.00  < > 0.80  < > 0.74  CALCIUM 8.4*  < > 8.5*  < > 8.0*  MG  --   --  2.3  --   --   AST 20  --   --   --   --   ALT 20  --   --   --   --   ALKPHOS 70  --   --   --   --   BILITOT 1.0  --   --   --   --   < > = values in this interval not displayed.  Cardiac Enzymes  Recent Labs Lab 09/17/15 1730 09/17/15 2305 09/18/15 0603  TROPONINI 0.07* 0.20* 0.05*    Microbiology Results  @MICRORSLT48 @  RADIOLOGY:  Ct Angio Chest Pe W/cm &/or Wo Cm  09/21/2015  CLINICAL DATA:  Progressive shortness of breath EXAM: CT ANGIOGRAPHY CHEST WITH CONTRAST TECHNIQUE: Multidetector CT imaging of the chest was performed using the standard protocol during bolus administration of intravenous contrast. Multiplanar CT image reconstructions and MIPs were obtained to evaluate the vascular anatomy. CONTRAST:  42mL OMNIPAQUE IOHEXOL 350 MG/ML SOLN COMPARISON:  09/17/2015. FINDINGS: Mediastinum: Heart size is normal. No pericardial effusion. Calcification involving the thoracic aorta noted. LAD coronary artery and RCA coronary artery calcifications noted. The trachea appears patent and is midline. Normal appearance of the esophagus. Multiple small (less than 1 cm) mediastinal lymph nodes are identified. No adenopathy. The main pulmonary artery appears patent. No lobar or segmental pulmonary artery filling defects identified. Lungs/Pleura: There are bilateral pleural effusions right greater in left. Right lower lobe airspace consolidation is identified, worrisome for pneumonia. Mild changes of paraseptal emphysema noted. Calcified granuloma noted in the left lower lobe. Upper Abdomen: The visualized portions of the liver are normal. The spleen is unremarkable. The visualized portions of the adrenal glands and left kidney  normal. Musculoskeletal: Degenerative disc disease is present throughout the thoracic spine. No aggressive lytic or sclerotic bone lesions. Review of the MIP images confirms the above findings. IMPRESSION: 1. No acute pulmonary embolus. 2. Bilateral pleural effusions are identified. There is airspace consolidation in the right lower lobe compatible with pneumonia. Followup PA and lateral chest X-ray is recommended in 3-4 weeks following trial of antibiotic therapy to ensure resolution and exclude underlying malignancy. 3. Aortic atherosclerosis and coronary artery calcifications. 4. Mild  paraseptal emphysema. Electronically Signed   By: Kerby Moors M.D.   On: 09/21/2015 13:25      Management plans discussed with the patient and she is in agreement. Stable for discharge   Patient should follow up with PCP in 1 week  CODE STATUS:     Code Status Orders        Start     Ordered   09/17/15 1838  Full code   Continuous     09/17/15 1837    Code Status History    Date Active Date Inactive Code Status Order ID Comments User Context   This patient has a current code status but no historical code status.      TOTAL TIME TAKING CARE OF THIS PATIENT: 35 minutes.    Note: This dictation was prepared with Dragon dictation along with smaller phrase technology. Any transcriptional errors that result from this process are unintentional.  Callahan Wild M.D on 09/23/2015 at 11:05 AM  Between 7am to 6pm - Pager - [phone] After 6pm go to [email or web address] - password [email or web address] [facility]  Office  [phone]  CC: Primary care physician; Lelon Huh, MD

## 2015-09-23 NOTE — Progress Notes (Signed)
Patient has refused SNF.  Will discharge with home health.  RN care manager will set up all needed services.  CSW signing off.  Molly Hodges. [facility] [phone] 11:12 AM

## 2015-09-23 NOTE — Care Management Note (Signed)
Case Management Note  Patient Details  Name: Molly Hodges MRN: [id number] Date of Birth: 07-25-1932  Subjective/Objective:     Eliquis coupon given. Referral for home health PT, RN, Nurse aid faxed and called to [facility] .              Action/Plan:   Expected Discharge Date:                  Expected Discharge Plan:     In-House Referral:     Discharge planning Services  CM Consult  Post Acute Care Choice:  Home Health, Durable Medical Equipment Choice offered to:  Patient  DME Arranged:    DME Agency:  [place]:    [facility] Agency:     Status of Service:  In process, will continue to follow  Medicare Important Message Given:  Yes Date Medicare IM Given:    Medicare IM give by:    Date Additional Medicare IM Given:    Additional Medicare Important Message give by:     If discussed at [place] of Stay Meetings, dates discussed:    Additional Comments:  Andrian Sabala A, RN 09/23/2015, 11:17 AM

## 2015-09-23 NOTE — Progress Notes (Signed)
Patient does not need home O2 per Dr. Benjie Karvonen. Sats are fine on room air. Portable O2 tank has already been delivered - per Jeani Hawking, CM, place patient's name on sticky note on the tank and place at nurses station for [facility] to come pick it up. RN does not need to notify anyone else, CM will handle it. Pt ready for discharge.

## 2015-09-23 NOTE — Progress Notes (Signed)
Patient given discharge teaching and paperwork regarding medications, diet, follow-up appointments and activity. Patient understanding verbalized. No complaints at this time. IV and telemetry discontinued prior to leaving. Skin assessment as previously charted and vitals are stable; on room air. Patient being discharged to home. Caregiver/family present during discharge teaching. No further needs by Care Management.  

## 2015-09-23 NOTE — Progress Notes (Signed)
Patient refusing bed alarm while family is at bedside. Educated on safety. Will call if assistance is needed.

## 2015-09-25 NOTE — Care Management (Signed)
Post discharge: Received notification that [place] faxed to Dr Benjie Karvonen regarding Eliquis denial. CM gave patient coupon. Patient confirmed that she was able to obtain Eliquis for free # 60 5 mg tab with coupon.

## 2015-09-26 DIAGNOSIS — I4891 Unspecified atrial fibrillation: Secondary | ICD-10-CM | POA: Diagnosis not present

## 2015-09-26 DIAGNOSIS — Z7951 Long term (current) use of inhaled steroids: Secondary | ICD-10-CM | POA: Diagnosis not present

## 2015-09-26 DIAGNOSIS — J189 Pneumonia, unspecified organism: Secondary | ICD-10-CM | POA: Diagnosis not present

## 2015-09-26 DIAGNOSIS — I493 Ventricular premature depolarization: Secondary | ICD-10-CM | POA: Diagnosis not present

## 2015-09-26 DIAGNOSIS — J449 Chronic obstructive pulmonary disease, unspecified: Secondary | ICD-10-CM | POA: Diagnosis not present

## 2015-09-26 DIAGNOSIS — J440 Chronic obstructive pulmonary disease with acute lower respiratory infection: Secondary | ICD-10-CM | POA: Diagnosis not present

## 2015-09-26 DIAGNOSIS — I10 Essential (primary) hypertension: Secondary | ICD-10-CM | POA: Diagnosis not present

## 2015-09-26 DIAGNOSIS — J45909 Unspecified asthma, uncomplicated: Secondary | ICD-10-CM | POA: Diagnosis not present

## 2015-09-26 DIAGNOSIS — Z7901 Long term (current) use of anticoagulants: Secondary | ICD-10-CM | POA: Diagnosis not present

## 2015-09-26 DIAGNOSIS — G629 Polyneuropathy, unspecified: Secondary | ICD-10-CM | POA: Diagnosis not present

## 2015-09-28 ENCOUNTER — Ambulatory Visit (INDEPENDENT_AMBULATORY_CARE_PROVIDER_SITE_OTHER): Payer: Medicare Other | Admitting: Family Medicine

## 2015-09-28 ENCOUNTER — Encounter: Payer: Self-pay | Admitting: Family Medicine

## 2015-09-28 VITALS — BP 122/60 | HR 78 | Temp 97.5°F | Resp 18 | Wt 139.0 lb

## 2015-09-28 DIAGNOSIS — J189 Pneumonia, unspecified organism: Secondary | ICD-10-CM

## 2015-09-28 DIAGNOSIS — I4891 Unspecified atrial fibrillation: Secondary | ICD-10-CM

## 2015-09-28 DIAGNOSIS — J181 Lobar pneumonia, unspecified organism: Principal | ICD-10-CM

## 2015-09-28 NOTE — Patient Instructions (Signed)
Go to the [facility] on [address] for Chest  Xray on Wednesday or Thursday of next week.

## 2015-09-28 NOTE — Progress Notes (Signed)
Patient: Molly Hodges Female    DOB: [birth year]   82 y.o.   MRN: [id number] Visit Date: 09/28/2015  Today's Provider: Lelon Huh, MD   Chief Complaint  Patient presents with  . Hospitalization Follow-up  . Pneumonia   Subjective:    HPI  Follow up Hospitalization/ Pneumonia  Patient was admitted to [facility] on 09/17/2015 and discharged on 09/23/2015. She was treated for Pneumonia and sepsis. Per discharge summary, patient was to continue Levaquin for 4 more days after discharge. Patient is to have a follow up chest x ray in 3-4 weeks and follow up with PCP in 1 week. She reports excellent compliance with treatment. She reports this condition is Improved. Patient is still taking Levaquin and reports she has 2-3 more doses left. Patient reports good tolerance with treatment.   Upon admission she was also found to be in atrial fibrillation. She was started on Eliquis and diltiazem which she states she is tolerating well.  LVEF was 60-65%. She has follow up with [facility] in one week.   ------------------------------------------------------------------------------------     Allergies  Allergen Reactions  . Shellfish Allergy Anaphylaxis  . Azithromycin Other (See Comments)    Extreme burning sensation at IV site  . Tamiflu [Oseltamivir Phosphate] Other (See Comments)    Reaction:  Hallucinations    Previous Medications   APIXABAN (ELIQUIS) 5 MG TABS TABLET    Take 1 tablet (5 mg total) by mouth 2 (two) times daily.   CALCIUM CARBONATE-VITAMIN D (CALCIUM 600+D) 600-400 MG-UNIT TABLET    Take 1 tablet by mouth daily.   CHOLECALCIFEROL (VITAMIN D) 1000 UNITS TABLET    Take 1,000 Units by mouth daily.   DILTIAZEM (CARDIZEM CD) 180 MG 24 HR CAPSULE    Take 1 capsule (180 mg total) by mouth daily.   DORZOLAMIDE (TRUSOPT) 2 % OPHTHALMIC SOLUTION    Place 1 drop into both eyes 2 (two) times daily.    FLUTICASONE-SALMETEROL (ADVAIR DISKUS) 250-50 MCG/DOSE AEPB    Inhale 1 puff  into the lungs 2 (two) times daily.   GABAPENTIN (NEURONTIN) 100 MG CAPSULE    Take 400 mg by mouth at bedtime.   HYDROCHLOROTHIAZIDE (MICROZIDE) 12.5 MG CAPSULE    Take 12.5 mg by mouth daily.   LATANOPROST (XALATAN) 0.005 % OPHTHALMIC SOLUTION    Place 1 drop into both eyes at bedtime.    LEVOFLOXACIN (LEVAQUIN) 250 MG TABLET    Take 1 tablet (250 mg total) by mouth daily.   MAGNESIUM OXIDE (MAG-OX) 400 MG TABLET    Take 400 mg by mouth daily.   OMEGA-3 FATTY ACIDS (FISH OIL BURP-LESS) 1000 MG CAPS    Take 1,000 mg by mouth daily.    VITAMIN C (ASCORBIC ACID) 500 MG TABLET    Take 1,000 mg by mouth daily.   VITAMIN E 100 UNIT CAPSULE    Take 200 Units by mouth daily.    Review of Systems  Constitutional: Negative for fever, chills, diaphoresis, appetite change and fatigue.  HENT: Positive for sore throat (woke up this morning with sore throat; improving throughout the day). Negative for nosebleeds, postnasal drip, rhinorrhea, sinus pressure and sneezing.   Respiratory: Positive for cough (sometimes productive with clear phelgm). Negative for chest tightness and shortness of breath.   Cardiovascular: Negative for chest pain and palpitations.  Gastrointestinal: Negative for nausea, vomiting and abdominal pain.  Neurological: Negative for dizziness and weakness.    Social History  Substance Use Topics  .  Smoking status: Never Smoker   . Smokeless tobacco: Never Used  . Alcohol Use: No   Objective:   BP 122/60 mmHg  Pulse 78  Temp(Src) 97.5 F (36.4 C) (Oral)  Resp 18  Wt 139 lb (63.05 kg)  SpO2 100%  Physical Exam   General Appearance:    Alert, cooperative, no distress  Eyes:    PERRL, conjunctiva/corneas clear, EOM's intact       Lungs:     Clear to auscultation bilaterally, respirations unlabored  Heart:    Irregularly irregular.  Rate controlled.   Neurologic:   Awake, alert, oriented x 3. No apparent focal neurological           defect.           Assessment & Plan:       1. Right upper lobe pneumonia Clinically resolved. Finish levaquin. Repeat chest Xray next week.  - DG Chest 2 View; Future  2. Atrial fibrillation, unspecified type ([place]) Rate well controlled. Still with irregular rhythm today. Norval EF on echo. Continue diltiazem and Eliquis. Follow up cardiology next week as scheduled.         Lelon Huh, MD  [facility]

## 2015-10-03 ENCOUNTER — Ambulatory Visit
Admission: RE | Admit: 2015-10-03 | Discharge: 2015-10-03 | Disposition: A | Discharge status: Home / Self Care | Payer: Medicare Other | Source: Ambulatory Visit | Attending: Family Medicine | Admitting: Family Medicine

## 2015-10-03 DIAGNOSIS — R05 Cough: Secondary | ICD-10-CM | POA: Diagnosis not present

## 2015-10-03 DIAGNOSIS — I10 Essential (primary) hypertension: Secondary | ICD-10-CM | POA: Diagnosis not present

## 2015-10-03 DIAGNOSIS — Z7901 Long term (current) use of anticoagulants: Secondary | ICD-10-CM | POA: Diagnosis not present

## 2015-10-03 DIAGNOSIS — J189 Pneumonia, unspecified organism: Secondary | ICD-10-CM

## 2015-10-03 DIAGNOSIS — G629 Polyneuropathy, unspecified: Secondary | ICD-10-CM | POA: Diagnosis not present

## 2015-10-03 DIAGNOSIS — J8410 Pulmonary fibrosis, unspecified: Secondary | ICD-10-CM | POA: Insufficient documentation

## 2015-10-03 DIAGNOSIS — J440 Chronic obstructive pulmonary disease with acute lower respiratory infection: Secondary | ICD-10-CM | POA: Diagnosis not present

## 2015-10-03 DIAGNOSIS — I4891 Unspecified atrial fibrillation: Secondary | ICD-10-CM | POA: Diagnosis not present

## 2015-10-03 DIAGNOSIS — J449 Chronic obstructive pulmonary disease, unspecified: Secondary | ICD-10-CM | POA: Diagnosis not present

## 2015-10-03 DIAGNOSIS — J45909 Unspecified asthma, uncomplicated: Secondary | ICD-10-CM | POA: Diagnosis not present

## 2015-10-03 DIAGNOSIS — J181 Lobar pneumonia, unspecified organism: Principal | ICD-10-CM

## 2015-10-03 DIAGNOSIS — I493 Ventricular premature depolarization: Secondary | ICD-10-CM | POA: Diagnosis not present

## 2015-10-03 DIAGNOSIS — Z7951 Long term (current) use of inhaled steroids: Secondary | ICD-10-CM | POA: Diagnosis not present

## 2015-10-04 ENCOUNTER — Other Ambulatory Visit: Payer: Self-pay | Admitting: Family Medicine

## 2015-10-04 DIAGNOSIS — J189 Pneumonia, unspecified organism: Secondary | ICD-10-CM

## 2015-10-04 DIAGNOSIS — J181 Lobar pneumonia, unspecified organism: Principal | ICD-10-CM

## 2015-10-04 MED ORDER — LEVOFLOXACIN 500 MG PO TABS: 250.0000 mg | ORAL_TABLET | Freq: Every day | ORAL | Status: AC

## 2015-10-05 ENCOUNTER — Encounter: Payer: Self-pay | Admitting: Physician Assistant

## 2015-10-05 ENCOUNTER — Other Ambulatory Visit: Payer: Self-pay

## 2015-10-05 ENCOUNTER — Ambulatory Visit (INDEPENDENT_AMBULATORY_CARE_PROVIDER_SITE_OTHER): Payer: Medicare Other | Admitting: Physician Assistant

## 2015-10-05 ENCOUNTER — Other Ambulatory Visit
Admission: RE | Admit: 2015-10-05 | Discharge: 2015-10-05 | Disposition: A | Discharge status: Home / Self Care | Payer: Medicare Other | Source: Ambulatory Visit | Attending: Physician Assistant | Admitting: Physician Assistant

## 2015-10-05 VITALS — BP 120/62 | HR 82 | Ht 65.0 in | Wt 139.5 lb

## 2015-10-05 DIAGNOSIS — I481 Persistent atrial fibrillation: Secondary | ICD-10-CM | POA: Insufficient documentation

## 2015-10-05 DIAGNOSIS — J4520 Mild intermittent asthma, uncomplicated: Secondary | ICD-10-CM | POA: Diagnosis not present

## 2015-10-05 DIAGNOSIS — I10 Essential (primary) hypertension: Secondary | ICD-10-CM

## 2015-10-05 DIAGNOSIS — J42 Unspecified chronic bronchitis: Secondary | ICD-10-CM

## 2015-10-05 DIAGNOSIS — I4819 Other persistent atrial fibrillation: Secondary | ICD-10-CM

## 2015-10-05 LAB — CBC WITH DIFFERENTIAL/PLATELET
Basophils Absolute: 0.1 10*3/uL (ref 0–0.1)
Basophils Relative: 1 %
Eosinophils Absolute: 0.1 10*3/uL (ref 0–0.7)
Eosinophils Relative: 2 %
HCT: 40.9 % (ref 35.0–47.0)
Hemoglobin: 13.7 g/dL (ref 12.0–16.0)
Lymphocytes Relative: 16 %
Lymphs Abs: 1.4 10*3/uL (ref 1.0–3.6)
MCH: 27.8 pg (ref 26.0–34.0)
MCHC: 33.4 g/dL (ref 32.0–36.0)
MCV: 83.2 fL (ref 80.0–100.0)
Monocytes Absolute: 0.8 10*3/uL (ref 0.2–0.9)
Monocytes Relative: 8 %
Neutro Abs: 6.9 10*3/uL — ABNORMAL HIGH (ref 1.4–6.5)
Neutrophils Relative %: 73 %
Platelets: 269 10*3/uL (ref 150–440)
RBC: 4.92 MIL/uL (ref 3.80–5.20)
RDW: 14.6 % — ABNORMAL HIGH (ref 11.5–14.5)
WBC: 9.3 10*3/uL (ref 3.6–11.0)

## 2015-10-05 LAB — BASIC METABOLIC PANEL
Anion gap: 10 (ref 5–15)
BUN: 14 mg/dL (ref 6–20)
CO2: 26 mmol/L (ref 22–32)
Calcium: 9.5 mg/dL (ref 8.9–10.3)
Chloride: 101 mmol/L (ref 101–111)
Creatinine, Ser: 1 mg/dL (ref 0.44–1.00)
GFR calc Af Amer: 60 mL/min — ABNORMAL LOW (ref >60)
GFR calc non Af Amer: 51 mL/min — ABNORMAL LOW (ref >60)
Glucose, Bld: 123 mg/dL — ABNORMAL HIGH (ref 65–99)
Potassium: 3.5 mmol/L (ref 3.5–5.1)
Sodium: 137 mmol/L (ref 135–145)

## 2015-10-05 MED ORDER — POTASSIUM CHLORIDE ER 10 MEQ PO TBCR: 10.0000 meq | EXTENDED_RELEASE_TABLET | Freq: Every day | ORAL | Status: DC

## 2015-10-05 MED ORDER — AMIODARONE HCL 200 MG PO TABS: 200.0000 mg | ORAL_TABLET | ORAL | Status: DC

## 2015-10-05 NOTE — Progress Notes (Signed)
Cardiology Office Note Date:  10/05/2015  Patient ID:  Suvi, Husman 08-24-1931, MRN [id number] PCP:  Lelon Huh, MD  Cardiologist:  Dr. Rockey Situ, MD    Chief Complaint: Hospital follow up for new onset Afib  History of Present Illness: Molly Hodges is a 82 y.o. female with history of recently diagnosed new onset Afib, COPD, asthma, prior episodes of PNA, palpitations with no prior diagnosis of Afib prior to her most recent hospital admission from 1/30 to 2/3, and HTN who presents for hospital follow up of her new onset Afib as above. Prior to this admission she did not ahve any previously known cardiac history. She reported a history of palpitations in the past with a previous uneventful work up. She had been experiencing a low-grade fever since the spring and summer of 2016. She was originally diagnosed with PNA in 03/2015, treated with ABX, repeat CXR in October showed near total resolution along RLL. She continued to have an intermittent pain along right lower chest wall. Repeat CXR was advised in 2-3 weeks from the October study, this was not obtained. She returned to PCP on 1/30 with worsening congestion, fever, and chills. No cough, weight gain, LEE, orthopnea, PNA, chest pain, or tachy-palpitations. Tmax on 1/28 of 103. Temp at PCP was 100.7. She was sent to the ED for further evaluation. Of note, her HR at her PCP was found to be 130, no ECG obtained. She was found to be in new onset Afib with RVR on 1/30 upon her arrival to [facility]. Troponin peaked at 0.20. WBC of 21.2. K+ 3.4. UA with TNTC rbc and wbc per HPF. She was started on Eliquis given her stroke risk (CHADS2VASc of 4 - HTN, age x 2, female) and rate controlled. Beta blockers were avoided given her asthma. Echo showed EF of 60-65%, normal wall motion, not technically sufficient to allow for wall motion, mild MR, left atrium was mildly dilated, RV systolic function was normal, PASP was normal.   She continues to have a  right lung base PNA and is on Levaquin 250 mg daily per her PCP. She is asymptomatic regarding her Afib. No tachy-palpitations, SOB, chest pain, nausea, vomiting, presyncope, or syncope. No lower extremity swelling, orthopnea, or early satiety. She feels like her strength is beginning to come back to her, though overall does still feel weak. She has not missed any doses of her Eliquis and is tolerating this and her Cardizem CD without issues.    Past Medical History  Diagnosis Date  . Pneumonia   . Hypertension   . Palpitation   . COPD (chronic obstructive pulmonary disease) ([place])   . Asthma   . Peripheral neuropathy (HCC)     in both feet  . New onset atrial fibrillation ([place]) 09/17/2015    a. On Eliquis    Past Surgical History  Procedure Laterality Date  . Abdominal hysterectomy  1987    due to heavy bleeding  . Appendectomy    . Tonsillectomy    . Cataract extraction      Current Outpatient Prescriptions  Medication Sig Dispense Refill  . apixaban (ELIQUIS) 5 MG TABS tablet Take 1 tablet (5 mg total) by mouth 2 (two) times daily. 60 tablet 0  . Calcium Carbonate-Vitamin D (CALCIUM 600+D) 600-400 MG-UNIT tablet Take 1 tablet by mouth daily.    . cholecalciferol (VITAMIN D) 1000 UNITS tablet Take 1,000 Units by mouth daily.    Marland Kitchen diltiazem (CARDIZEM CD) 180 MG 24  hr capsule Take 1 capsule (180 mg total) by mouth daily. 30 capsule 0  . dorzolamide (TRUSOPT) 2 % ophthalmic solution Place 1 drop into both eyes 2 (two) times daily.     . Fluticasone-Salmeterol (ADVAIR DISKUS) 250-50 MCG/DOSE AEPB Inhale 1 puff into the lungs 2 (two) times daily.    Marland Kitchen gabapentin (NEURONTIN) 100 MG capsule Take 400 mg by mouth at bedtime.    . hydrochlorothiazide (MICROZIDE) 12.5 MG capsule Take 12.5 mg by mouth daily.    Marland Kitchen latanoprost (XALATAN) 0.005 % ophthalmic solution Place 1 drop into both eyes at bedtime.     Marland Kitchen levofloxacin (LEVAQUIN) 500 MG tablet Take 0.5 tablets (250 mg total) by mouth daily.  7 tablet 0  . magnesium oxide (MAG-OX) 400 MG tablet Take 400 mg by mouth daily.    . Omega-3 Fatty Acids (FISH OIL BURP-LESS) 1000 MG CAPS Take 1,000 mg by mouth daily.     . vitamin C (ASCORBIC ACID) 500 MG tablet Take 1,000 mg by mouth daily.    . vitamin E 100 UNIT capsule Take 200 Units by mouth daily.    Marland Kitchen amiodarone (PACERONE) 200 MG tablet Take 1 tablet (200 mg total) by mouth as directed. 90 tablet 3   No current facility-administered medications for this visit.    Allergies:   Shellfish allergy; Azithromycin; and Tamiflu   Social History:  The patient  reports that she has never smoked. She has never used smokeless tobacco. She reports that she does not drink alcohol or use illicit drugs.   Family History:  The patient's family history includes CAD in her father and mother.  ROS:   Review of Systems  Constitutional: Positive for malaise/fatigue. Negative for fever, chills, weight loss and diaphoresis.  HENT: Negative for congestion.   Eyes: Negative for discharge and redness.  Respiratory: Positive for cough and sputum production. Negative for hemoptysis, shortness of breath and wheezing.        One yellow-green phlegm cough  Cardiovascular: Negative for chest pain, palpitations, orthopnea, claudication, leg swelling and PND.  Gastrointestinal: Negative for nausea and vomiting.  Musculoskeletal: Negative for falls.  Skin: Negative for rash.  Neurological: Positive for weakness. Negative for dizziness, tingling, tremors, sensory change, speech change, focal weakness and loss of consciousness.  Endo/Heme/Allergies: Does not bruise/bleed easily.  Psychiatric/Behavioral: The patient is not nervous/anxious.      PHYSICAL EXAM:  VS:  BP 120/62 mmHg  Pulse 82  Ht 5\' 5"  (1.651 m)  Wt 139 lb 8 oz (63.277 kg)  BMI 23.21 kg/m2 BMI: Body mass index is 23.21 kg/(m^2). Well nourished, well developed, in no acute distress HEENT: normocephalic, atraumatic Neck: no JVD, carotid  bruits or masses Cardiac: irregularly-irregular, S1, S2; RRR; no murmurs, rubs, or gallops Lungs: rhonchi along the right base  Abd: soft, nontender, no hepatomegaly, + BS MS: no deformity or atrophy Ext: no edema Skin: warm and dry, no rash Neuro:  moves all extremities spontaneously, no focal abnormalities noted, follows commands Psych: euthymic mood, full affect   EKG:  Was ordered today. Shows Afib, 75 bpm, nonspecific lateral st/t changes   Recent Labs: 09/17/2015: ALT 20 09/20/2015: Magnesium 2.3 09/23/2015: BUN 21*; Creatinine, Ser 0.74; Hemoglobin 13.5; Platelets 309; Potassium 3.6; Sodium 136  No results found for requested labs within last 365 days.   Estimated Creatinine Clearance: 49.6 mL/min (by C-G formula based on Cr of 0.74).   Wt Readings from Last 3 Encounters:  10/05/15 139 lb 8 oz (63.277 kg)  09/28/15 139 lb (63.05 kg)  09/17/15 158 lb 8 oz (71.895 kg)     Other studies reviewed: Additional studies/records reviewed today include: summarized above  ASSESSMENT AND PLAN:  1. Persistent Afib: She is rate controlled with a heart rate in the 70's bpm today. Suspect she would do better back in sinus rhythm. However, it is uncertain if she would hold sinus rhythm at this time with her continued right base PNA. Because of this both she, her husband, and I decided to hold on DCCV at this time. She will start amiodarone 400 mg bid x 1 week, 200 mg bid x 1 week, then 200 mg daily thereafter. She will continue Cardizem CD 180 mg daily. Once her PNA is resolved, should she remain in Afib she can pursue DCCV at that time. She has not missed any doses of her Eliquis. She will continue Eliquis 5 mg bid. Based on her age, weight, and renal function this is the correct dose for her. CHADSVASc of at least 4 (HTN, age x 2, female). Continue to avoid beta blockers given her COPD and asthma.   2. COPD/asthma: Inhalers per PCP.   3. PNA: Persists. Repeat CXR from 10/03/2015 showed residual  opacity consistent with residual infiltrate/pneumonitis in the periphery of the right lung base. She continues to be on Levaquin per PCP.   4. HTN: Well controlled. Continue current medications.   Disposition: F/u with Dr. Rockey Situ in 2.5 weeks  Current medicines are reviewed at length with the patient today.  The patient did not have any concerns regarding medicines.  Melvern Banker PA-C 10/05/2015 12:42 PM     [address], [place] [address] [phone]

## 2015-10-05 NOTE — Patient Instructions (Addendum)
Medication Instructions:  Please start amiodarone: 400 mg twice daily x 7 days, then 200 mg twice daily x 7 days, then 200 mg once daily  Labwork: BMET, CBC  Testing/Procedures: None  Follow-Up: In 2-3 weeks with Ignacia Bayley, NP  Date & time:  ___________________________  If you need a refill on your cardiac medications before your next appointment, please call your pharmacy.

## 2015-10-10 DIAGNOSIS — J440 Chronic obstructive pulmonary disease with acute lower respiratory infection: Secondary | ICD-10-CM | POA: Diagnosis not present

## 2015-10-10 DIAGNOSIS — J45909 Unspecified asthma, uncomplicated: Secondary | ICD-10-CM | POA: Diagnosis not present

## 2015-10-10 DIAGNOSIS — Z7951 Long term (current) use of inhaled steroids: Secondary | ICD-10-CM | POA: Diagnosis not present

## 2015-10-10 DIAGNOSIS — I4891 Unspecified atrial fibrillation: Secondary | ICD-10-CM | POA: Diagnosis not present

## 2015-10-10 DIAGNOSIS — I10 Essential (primary) hypertension: Secondary | ICD-10-CM | POA: Diagnosis not present

## 2015-10-10 DIAGNOSIS — Z7901 Long term (current) use of anticoagulants: Secondary | ICD-10-CM | POA: Diagnosis not present

## 2015-10-10 DIAGNOSIS — J449 Chronic obstructive pulmonary disease, unspecified: Secondary | ICD-10-CM | POA: Diagnosis not present

## 2015-10-10 DIAGNOSIS — J189 Pneumonia, unspecified organism: Secondary | ICD-10-CM | POA: Diagnosis not present

## 2015-10-10 DIAGNOSIS — G629 Polyneuropathy, unspecified: Secondary | ICD-10-CM | POA: Diagnosis not present

## 2015-10-10 DIAGNOSIS — I493 Ventricular premature depolarization: Secondary | ICD-10-CM | POA: Diagnosis not present

## 2015-10-17 DIAGNOSIS — I4891 Unspecified atrial fibrillation: Secondary | ICD-10-CM | POA: Diagnosis not present

## 2015-10-17 DIAGNOSIS — J45909 Unspecified asthma, uncomplicated: Secondary | ICD-10-CM | POA: Diagnosis not present

## 2015-10-17 DIAGNOSIS — J440 Chronic obstructive pulmonary disease with acute lower respiratory infection: Secondary | ICD-10-CM | POA: Diagnosis not present

## 2015-10-17 DIAGNOSIS — J189 Pneumonia, unspecified organism: Secondary | ICD-10-CM | POA: Diagnosis not present

## 2015-10-17 DIAGNOSIS — G629 Polyneuropathy, unspecified: Secondary | ICD-10-CM | POA: Diagnosis not present

## 2015-10-17 DIAGNOSIS — J449 Chronic obstructive pulmonary disease, unspecified: Secondary | ICD-10-CM | POA: Diagnosis not present

## 2015-10-17 DIAGNOSIS — I493 Ventricular premature depolarization: Secondary | ICD-10-CM | POA: Diagnosis not present

## 2015-10-17 DIAGNOSIS — Z7951 Long term (current) use of inhaled steroids: Secondary | ICD-10-CM | POA: Diagnosis not present

## 2015-10-17 DIAGNOSIS — I10 Essential (primary) hypertension: Secondary | ICD-10-CM | POA: Diagnosis not present

## 2015-10-17 DIAGNOSIS — Z7901 Long term (current) use of anticoagulants: Secondary | ICD-10-CM | POA: Diagnosis not present

## 2015-10-18 ENCOUNTER — Other Ambulatory Visit: Payer: Self-pay | Admitting: Family Medicine

## 2015-11-09 ENCOUNTER — Encounter: Payer: Self-pay | Admitting: Cardiovascular Disease

## 2015-11-09 ENCOUNTER — Other Ambulatory Visit
Admission: RE | Admit: 2015-11-09 | Discharge: 2015-11-09 | Disposition: A | Discharge status: Home / Self Care | Payer: Medicare Other | Source: Ambulatory Visit | Attending: Cardiovascular Disease | Admitting: Cardiovascular Disease

## 2015-11-09 ENCOUNTER — Ambulatory Visit (INDEPENDENT_AMBULATORY_CARE_PROVIDER_SITE_OTHER): Payer: Medicare Other | Admitting: Cardiovascular Disease

## 2015-11-09 VITALS — BP 140/54 | HR 65 | Ht 65.0 in | Wt 145.5 lb

## 2015-11-09 DIAGNOSIS — I481 Persistent atrial fibrillation: Secondary | ICD-10-CM | POA: Diagnosis not present

## 2015-11-09 DIAGNOSIS — Z01812 Encounter for preprocedural laboratory examination: Secondary | ICD-10-CM

## 2015-11-09 DIAGNOSIS — I4819 Other persistent atrial fibrillation: Secondary | ICD-10-CM

## 2015-11-09 DIAGNOSIS — Z7189 Other specified counseling: Secondary | ICD-10-CM | POA: Insufficient documentation

## 2015-11-09 DIAGNOSIS — I4891 Unspecified atrial fibrillation: Secondary | ICD-10-CM

## 2015-11-09 DIAGNOSIS — I10 Essential (primary) hypertension: Secondary | ICD-10-CM | POA: Insufficient documentation

## 2015-11-09 DIAGNOSIS — J432 Centrilobular emphysema: Secondary | ICD-10-CM | POA: Diagnosis not present

## 2015-11-09 LAB — CBC WITH DIFFERENTIAL/PLATELET
Basophils Absolute: 0.1 10*3/uL (ref 0–0.1)
Basophils Relative: 1 %
Eosinophils Absolute: 0.4 10*3/uL (ref 0–0.7)
Eosinophils Relative: 4 %
HCT: 41.4 % (ref 35.0–47.0)
Hemoglobin: 13.7 g/dL (ref 12.0–16.0)
Lymphocytes Relative: 13 %
Lymphs Abs: 1.1 10*3/uL (ref 1.0–3.6)
MCH: 27.8 pg (ref 26.0–34.0)
MCHC: 33 g/dL (ref 32.0–36.0)
MCV: 84.1 fL (ref 80.0–100.0)
Monocytes Absolute: 0.9 10*3/uL (ref 0.2–0.9)
Monocytes Relative: 10 %
Neutro Abs: 6.3 10*3/uL (ref 1.4–6.5)
Neutrophils Relative %: 72 %
Platelets: 208 10*3/uL (ref 150–440)
RBC: 4.92 MIL/uL (ref 3.80–5.20)
RDW: 15.1 % — ABNORMAL HIGH (ref 11.5–14.5)
WBC: 8.7 10*3/uL (ref 3.6–11.0)

## 2015-11-09 LAB — BASIC METABOLIC PANEL
Anion gap: 8 (ref 5–15)
BUN: 20 mg/dL (ref 6–20)
CO2: 27 mmol/L (ref 22–32)
Calcium: 9.2 mg/dL (ref 8.9–10.3)
Chloride: 104 mmol/L (ref 101–111)
Creatinine, Ser: 0.84 mg/dL (ref 0.44–1.00)
GFR calc Af Amer: >60 mL/min (ref >60)
GFR calc non Af Amer: >60 mL/min (ref >60)
Glucose, Bld: 105 mg/dL — ABNORMAL HIGH (ref 65–99)
Potassium: 4 mmol/L (ref 3.5–5.1)
Sodium: 139 mmol/L (ref 135–145)

## 2015-11-09 LAB — PROTIME-INR
INR: 1.28
Prothrombin Time: 16.1 seconds — ABNORMAL HIGH (ref 11.4–15.0)

## 2015-11-09 NOTE — Patient Instructions (Addendum)
You are doing well.  Research xarelto (once a day) or pradaxa  (twice a day)  We will schedule a cardioversion next week Increase the amiodarone up to one pill twice a day  Electrical Cardioversion uses a jolt of electricity to your heart either through paddles or wired patches attached to your chest. This is a controlled, usually prescheduled, procedure. Defibrillation is done under light anesthesia in the hospital, and you usually go home the day of the procedure. This is done to get your heart back into a normal rhythm. You are not awake for the procedure.   You are scheduled for a Cardioversion on Friday, March 31 with Dr. Rockey Situ. Please arrive at the [place] [facility] at 6:30 a.m. on the day of your procedure.  DIET INSTRUCTIONS:  Nothing to eat or drink after midnight except your medications with a sip of water.    1) Labs: BMET, CBC, PT/INR  2) Medications:  YOU MAY TAKE ALL of your remaining medications with a small amount of water.  3) Must have a responsible person to drive you home.  4) Bring a current list of your medications and current insurance cards.    Please call us if you have new issues that need to be addressed before your next appt.  Your physician wants you to follow-up in: 1 month.   Date & time: ______________________________             Electrical Cardioversion Electrical cardioversion is the delivery of a jolt of electricity to change the rhythm of the heart. Sticky patches or metal paddles are placed on the chest to deliver the electricity from a device. This is done to restore a normal rhythm. A rhythm that is too fast or not regular keeps the heart from pumping well. Electrical cardioversion is done in an emergency if:   There is low or no blood pressure as a result of the heart rhythm.   Normal rhythm must be restored as fast as possible to protect the brain and heart from further damage.   It may save a life. Cardioversion may be  done for heart rhythms that are not immediately life threatening, such as atrial fibrillation or flutter, in which:   The heart is beating too fast or is not regular.   Medicine to change the rhythm has not worked.   It is safe to wait in order to allow time for preparation.  Symptoms of the abnormal rhythm are bothersome.  The risk of stroke and other serious problems can be reduced. LET [facility] CARE PROVIDER KNOW ABOUT:   Any allergies you have.  All medicines you are taking, including vitamins, herbs, eye drops, creams, and over-the-counter medicines.  Previous problems you or members of your family have had with the use of anesthetics.   Any blood disorders you have.   Previous surgeries you have had.   Medical conditions you have. RISKS AND COMPLICATIONS  Generally, this is a safe procedure. However, problems can occur and include:   Breathing problems related to the anesthetic used.  A blood clot that breaks free and travels to other parts of your body. This could cause a stroke or other problems. The risk of this is lowered by use of blood-thinning medicine (anticoagulant) prior to the procedure.  Cardiac arrest (rare). BEFORE THE PROCEDURE   You may have tests to detect blood clots in your heart and to evaluate heart function.  You may start taking anticoagulants so your blood does  not clot as easily.   Medicines may be given to help stabilize your heart rate and rhythm. PROCEDURE  You will be given medicine through an IV tube to reduce discomfort and make you sleepy (sedative).   An electrical shock will be delivered. AFTER THE PROCEDURE Your heart rhythm will be watched to make sure it does not change.    This information is not intended to replace advice given to you by your health care provider. Make sure you discuss any questions you have with your health care provider.   Document Released: 07/25/2002 Document Revised: 08/25/2014 Document  Reviewed: 02/16/2013 Elsevier Interactive Patient Education 2016 [employer]. Electrical Cardioversion, Care After Refer to this sheet in the next few weeks. These instructions provide you with information on caring for yourself after your procedure. Your health care provider may also give you more specific instructions. Your treatment has been planned according to current medical practices, but problems sometimes occur. Call your health care provider if you have any problems or questions after your procedure. WHAT TO EXPECT AFTER THE PROCEDURE After your procedure, it is typical to have the following sensations:  Some redness on the skin where the shocks were delivered. If this is tender, a sunburn lotion or hydrocortisone cream may help.  Possible return of an abnormal heart rhythm within hours or days after the procedure. HOME CARE INSTRUCTIONS  Take medicines only as directed by your health care provider. Be sure you understand how and when to take your medicine.  Learn how to feel your pulse and check it often.  Limit your activity for 48 hours after the procedure or as directed by your health care provider.  Avoid or minimize caffeine and other stimulants as directed by your health care provider. SEEK MEDICAL CARE IF:  You feel like your heart is beating too fast or your pulse is not regular.  You have any questions about your medicines.  You have bleeding that will not stop. SEEK IMMEDIATE MEDICAL CARE IF:  You are dizzy or feel faint.  It is hard to breathe or you feel short of breath.  There is a change in discomfort in your chest.  Your speech is slurred or you have trouble moving an arm or leg on one side of your body.  You get a serious muscle cramp that does not go away.  Your fingers or toes turn cold or blue.   This information is not intended to replace advice given to you by your health care provider. Make sure you discuss any questions you have with your  health care provider.   Document Released: 05/25/2013 Document Revised: 08/25/2014 Document Reviewed: 05/25/2013 Elsevier Interactive Patient Education [employer].

## 2015-11-09 NOTE — Assessment & Plan Note (Signed)
Breathing is stable, no recent COPD exacerbation

## 2015-11-09 NOTE — Assessment & Plan Note (Signed)
Heart rate well controlled, she continues to have symptoms of fatigue Recommended cardioversion next week She has been compliant with anticoagulation, takes eliquis twice a day. This was confirmed with her. Orders for pre-cardioversion lab work placed and drawn today Recommended she increase amiodarone up to 200 mrem twice a day in preparation

## 2015-11-09 NOTE — Assessment & Plan Note (Signed)
She reports compliance with her anticoagulation

## 2015-11-09 NOTE — H&P (Signed)
Patient ID: Molly Hodges, female    DOB: 03/09/1932, 82 y.o.   MRN: [id number]  HPI Comments: Molly Hodges is a 82 y.o. female with history of persistent Afib noted on recent hospitalization January 2017 in the setting of pneumonia, history of COPD, asthma, prior episodes of PNA, palpitations with no prior diagnosis of Afib, who presents for routine follow-up of her persistent atrial fibrillation.  On today's visit, she reports feeling relatively well, denies tachycardia or palpitations She does feel more tired than she did prior to developing atrial fibrillation Otherwise is active Trace leg edema Started on amiodarone on her last clinic visit in effort to restore normal sinus rhythm. Denies any side effects  EKG on today's visit shows atrial fibrillation with ventricular rate 65 bpm, nonspecific ST abnormality  Other past medical history PNA in 03/2015, treated with ABX, repeat CXR in October showed near total resolution along RLL.  She continued to have an intermittent pain along right lower chest wall.  HR at her PCP was found to be 130, no ECG obtained.  She was found to be in new onset Afib with RVR on 1/30 upon her arrival to [facility].   She was started on Eliquis given her stroke risk (CHADS2VASc of 4 - HTN, age x 2, female) and rate controlled.  Beta blockers were avoided given her asthma.   Echo showed EF of 60-65%, normal wall motion, not technically sufficient to allow for wall motion, mild MR, left atrium was mildly dilated, RV systolic function was normal, PASP was normal.     Allergies  Allergen Reactions  . Shellfish Allergy Anaphylaxis  . Azithromycin Other (See Comments)    Extreme burning sensation at IV site  . Tamiflu [Oseltamivir Phosphate] Other (See Comments)    Reaction:  Hallucinations     Current Outpatient Prescriptions on File Prior to Visit  Medication Sig Dispense Refill  . amiodarone (PACERONE) 200 MG tablet Take 1 tablet (200 mg total) by  mouth as directed. 90 tablet 3  . Calcium Carbonate-Vitamin D (CALCIUM 600+D) 600-400 MG-UNIT tablet Take 1 tablet by mouth daily.    . cholecalciferol (VITAMIN D) 1000 UNITS tablet Take 1,000 Units by mouth daily.    Marland Kitchen diltiazem (CARDIZEM CD) 180 MG 24 hr capsule TAKE ONE CAPSULE BY MOUTH DAILY 30 capsule 5  . dorzolamide (TRUSOPT) 2 % ophthalmic solution Place 1 drop into both eyes 2 (two) times daily.     Marland Kitchen ELIQUIS 5 MG TABS tablet TAKE ONE (1) TABLET BY MOUTH TWO (2) TIMES DAILY 60 tablet 5  . Fluticasone-Salmeterol (ADVAIR DISKUS) 250-50 MCG/DOSE AEPB Inhale 1 puff into the lungs 2 (two) times daily.    Marland Kitchen gabapentin (NEURONTIN) 100 MG capsule Take 400 mg by mouth at bedtime.    . hydrochlorothiazide (MICROZIDE) 12.5 MG capsule Take 12.5 mg by mouth daily.    Marland Kitchen latanoprost (XALATAN) 0.005 % ophthalmic solution Place 1 drop into both eyes at bedtime.     . magnesium oxide (MAG-OX) 400 MG tablet Take 400 mg by mouth daily.    . Omega-3 Fatty Acids (FISH OIL BURP-LESS) 1000 MG CAPS Take 1,000 mg by mouth daily.     . potassium chloride (K-DUR) 10 MEQ tablet Take 1 tablet (10 mEq total) by mouth daily. 30 tablet 6  . vitamin C (ASCORBIC ACID) 500 MG tablet Take 1,000 mg by mouth daily.    . vitamin E 100 UNIT capsule Take 200 Units by mouth daily.  No current facility-administered medications on file prior to visit.    Past Medical History  Diagnosis Date  . Pneumonia   . Hypertension   . Palpitation   . COPD (chronic obstructive pulmonary disease) ([place])   . Asthma   . Peripheral neuropathy (HCC)     in both feet  . New onset atrial fibrillation ([place]) 09/17/2015    a. On Eliquis    Past Surgical History  Procedure Laterality Date  . Abdominal hysterectomy  1987    due to heavy bleeding  . Appendectomy    . Tonsillectomy    . Cataract extraction      Social History  reports that she has never smoked. She has never used smokeless tobacco. She reports that she does not drink  alcohol or use illicit drugs.  Family History family history includes CAD in her father and mother.   Review of Systems  Constitutional: Negative.   Respiratory: Negative.   Cardiovascular: Negative.   Gastrointestinal: Negative.   Musculoskeletal: Negative.   Neurological: Negative.   Hematological: Negative.   Psychiatric/Behavioral: Negative.   All other systems reviewed and are negative.   BP 140/54 mmHg  Pulse 65  Ht 5\' 5"  (1.651 m)  Wt 145 lb 8 oz (65.998 kg)  BMI 24.21 kg/m2   Physical Exam  Constitutional: She is oriented to person, place, and time. She appears well-developed and well-nourished.  HENT:  Head: Normocephalic.  Nose: Nose normal.  Mouth/Throat: Oropharynx is clear and moist.  Eyes: Conjunctivae are normal. Pupils are equal, round, and reactive to light.  Neck: Normal range of motion. Neck supple. No JVD present.  Cardiovascular: Normal rate, normal heart sounds and intact distal pulses.  An irregularly irregular rhythm present. Exam reveals no gallop and no friction rub.   No murmur heard. Trace nonpitting lower extremity edema bilaterally  Pulmonary/Chest: Effort normal and breath sounds normal. No respiratory distress. She has no wheezes. She has no rales. She exhibits no tenderness.  Abdominal: Soft. Bowel sounds are normal. She exhibits no distension. There is no tenderness.  Musculoskeletal: Normal range of motion. She exhibits no edema or tenderness.  Lymphadenopathy:    She has no cervical adenopathy.  Neurological: She is alert and oriented to person, place, and time. Coordination normal.  Skin: Skin is warm and dry. No rash noted. No erythema.  Psychiatric: She has a normal mood and affect. Her behavior is normal. Judgment and thought content normal.

## 2015-11-09 NOTE — Assessment & Plan Note (Signed)
Blood pressure is well controlled on today's visit. No changes made to the medications.   Total encounter time more than 25 minutes  Greater than 50% was spent in counseling and coordination of care with the patient  

## 2015-11-11 ENCOUNTER — Other Ambulatory Visit: Payer: Self-pay | Admitting: Cardiovascular Disease

## 2015-11-13 ENCOUNTER — Telehealth: Payer: Self-pay

## 2015-11-13 NOTE — Telephone Encounter (Signed)
Received call from scheduling that Molly Hodges has a meeting on Friday am, and cannot perform pt's DCCV @ 7:30. Per Dr. Rockey Situ, ok to move to 9:00. Spoke w/ pt and she understands to arrive at the [place] @ 8:00.

## 2015-11-15 ENCOUNTER — Ambulatory Visit: Payer: Medicare Other | Admitting: Anesthesiology

## 2015-11-15 ENCOUNTER — Encounter: Payer: Self-pay | Admitting: *Deleted

## 2015-11-15 ENCOUNTER — Ambulatory Visit
Admission: RE | Admit: 2015-11-15 | Discharge: 2015-11-15 | Disposition: A | Discharge status: Home / Self Care | Payer: Medicare Other | Source: Ambulatory Visit | Attending: Cardiovascular Disease | Admitting: Cardiovascular Disease

## 2015-11-15 ENCOUNTER — Encounter
Admission: RE | Disposition: A | Discharge status: Home / Self Care | Payer: Self-pay | Source: Ambulatory Visit | Attending: Cardiovascular Disease

## 2015-11-15 DIAGNOSIS — I10 Essential (primary) hypertension: Secondary | ICD-10-CM | POA: Insufficient documentation

## 2015-11-15 DIAGNOSIS — I481 Persistent atrial fibrillation: Secondary | ICD-10-CM | POA: Insufficient documentation

## 2015-11-15 DIAGNOSIS — I4891 Unspecified atrial fibrillation: Secondary | ICD-10-CM | POA: Diagnosis not present

## 2015-11-15 DIAGNOSIS — J449 Chronic obstructive pulmonary disease, unspecified: Secondary | ICD-10-CM | POA: Diagnosis not present

## 2015-11-15 HISTORY — PX: ELECTROPHYSIOLOGIC STUDY: SHX172A

## 2015-11-15 HISTORY — DX: Unspecified glaucoma: H40.9

## 2015-11-15 SURGERY — CARDIOVERSION (CATH LAB)
Anesthesia: General

## 2015-11-15 MED ORDER — LIDOCAINE HCL (CARDIAC) 20 MG/ML IV SOLN
INTRAVENOUS | Status: DC | PRN
  Administered 2015-11-15: 30 mg via INTRATRACHEAL

## 2015-11-15 MED ORDER — SODIUM CHLORIDE 0.9 % IV SOLN
INTRAVENOUS | Status: DC
  Administered 2015-11-15: 07:00:00 via INTRAVENOUS

## 2015-11-15 MED ORDER — PROPOFOL 10 MG/ML IV BOLUS
INTRAVENOUS | Status: DC | PRN
  Administered 2015-11-15: 40 mg via INTRAVENOUS

## 2015-11-15 NOTE — Anesthesia Postprocedure Evaluation (Signed)
Anesthesia Post Note  Patient: Molly Hodges  Procedure(s) Performed: Procedure(s) (LRB): CARDIOVERSION (N/A)  Patient location during evaluation: Cath Lab Anesthesia Type: General Level of consciousness: awake and alert Pain management: pain level controlled Vital Signs Assessment: post-procedure vital signs reviewed and stable Respiratory status: spontaneous breathing and respiratory function stable Cardiovascular status: stable Anesthetic complications: no    Last Vitals:  Filed Vitals:   11/15/15 0745 11/15/15 0750  BP: 142/60 135/58  Pulse: 75 69  Temp:    Resp: 15 17    Last Pain: There were no vitals filed for this visit.               Sobia Karger K

## 2015-11-15 NOTE — Transfer of Care (Signed)
Immediate Anesthesia Transfer of Care Note  Patient: Molly Hodges  Procedure(s) Performed: Procedure(s): CARDIOVERSION (N/A)  Patient Location: Short Stay  Anesthesia Type:General  Level of Consciousness: awake, alert , oriented and patient cooperative  Airway & Oxygen Therapy: Patient Spontanous Breathing and Patient connected to nasal cannula oxygen  Post-op Assessment: Report given to RN, Post -op Vital signs reviewed and stable and Patient moving all extremities X 4  Post vital signs: Reviewed and stable  Last Vitals:  Filed Vitals:   11/15/15 0739 11/15/15 0740  BP:  142/64  Pulse: 79 82  Temp:    Resp: 14 15    Complications: No apparent anesthesia complications

## 2015-11-15 NOTE — CV Procedure (Signed)
Cardioversion procedure note For atrial fibrillation, persistent  Procedure Details:  Consent: Risks of procedure as well as the alternatives and risks of each were explained to the (patient/caregiver). Consent for procedure obtained.  Time Out: Verified patient identification, verified procedure, site/side was marked, verified correct patient position, special equipment/implants available, medications/allergies/relevent history reviewed, required imaging and test results available. Performed  Patient placed on cardiac monitor, pulse oximetry, supplemental oxygen as necessary.  Sedation given: propofol IV, Dr. Gephart Pacer pads placed anterior and posterior chest.   Cardioverted 1 time(s).  Cardioverted at 150  J. Synchronized biphasic Converted to NSR   Evaluation: Findings: Post procedure EKG shows: NSR Complications: None Patient did tolerate procedure well.  Time Spent Directly with the Patient:  45 minutes   Tim Claudina Oliphant, M.D., Ph.D. 

## 2015-11-15 NOTE — Discharge Instructions (Signed)
Electrical Cardioversion, Care After °Refer to this sheet in the next few weeks. These instructions provide you with information on caring for yourself after your procedure. Your health care provider may also give you more specific instructions. Your treatment has been planned according to current medical practices, but problems sometimes occur. Call your health care provider if you have any problems or questions after your procedure. °WHAT TO EXPECT AFTER THE PROCEDURE °After your procedure, it is typical to have the following sensations: °· Some redness on the skin where the shocks were delivered. If this is tender, a sunburn lotion or hydrocortisone cream may help. °· Possible return of an abnormal heart rhythm within hours or days after the procedure. °HOME CARE INSTRUCTIONS °· Take medicines only as directed by your health care provider. Be sure you understand how and when to take your medicine. °· Learn how to feel your pulse and check it often. °· Limit your activity for 48 hours after the procedure or as directed by your health care provider. °· Avoid or minimize caffeine and other stimulants as directed by your health care provider. °SEEK MEDICAL CARE IF: °· You feel like your heart is beating too fast or your pulse is not regular. °· You have any questions about your medicines. °· You have bleeding that will not stop. °SEEK IMMEDIATE MEDICAL CARE IF: °· You are dizzy or feel faint. °· It is hard to breathe or you feel short of breath. °· There is a change in discomfort in your chest. °· Your speech is slurred or you have trouble moving an arm or leg on one side of your body. °· You get a serious muscle cramp that does not go away. °· Your fingers or toes turn cold or blue. °  °This information is not intended to replace advice given to you by your health care provider. Make sure you discuss any questions you have with your health care provider. °  °Document Released: 05/25/2013 Document Revised: 08/25/2014  Document Reviewed: 05/25/2013 °Elsevier Interactive Patient Education ©2016 Elsevier Inc. ° °

## 2015-11-15 NOTE — Anesthesia Preprocedure Evaluation (Signed)
Anesthesia Evaluation  Patient identified by MRN, date of birth, ID band Patient awake    Reviewed: Allergy & Precautions, NPO status , Patient's Chart, lab work & pertinent test results  History of Anesthesia Complications (+) PROLONGED EMERGENCENegative for: history of anesthetic complications  Airway Mallampati: III       Dental   Pulmonary neg pulmonary ROS, asthma , COPD,  COPD inhaler,           Cardiovascular hypertension, Pt. on medications      Neuro/Psych negative neurological ROS     GI/Hepatic negative GI ROS, Neg liver ROS,   Endo/Other  negative endocrine ROS  Renal/GU negative Renal ROS     Musculoskeletal   Abdominal   Peds  Hematology   Anesthesia Other Findings   Reproductive/Obstetrics                             Anesthesia Physical Anesthesia Plan  ASA: III  Anesthesia Plan: General   Post-op Pain Management:    Induction: Intravenous  Airway Management Planned: Nasal Cannula  Additional Equipment:   Intra-op Plan:   Post-operative Plan:   Informed Consent: I have reviewed the patients History and Physical, chart, labs and discussed the procedure including the risks, benefits and alternatives for the proposed anesthesia with the patient or authorized representative who has indicated his/her understanding and acceptance.     Plan Discussed with:   Anesthesia Plan Comments:         Anesthesia Quick Evaluation

## 2015-11-15 NOTE — Progress Notes (Signed)
Patient is alert and oriented. Reporting no pain. In NSR, pre and post ECG completed and presented to Dr. Rockey Situ. Reviewed discharge instructions with patient and family including follow up appointment. VSS> Discharge home per orders.

## 2015-11-16 SURGERY — CARDIOVERSION (CATH LAB)
Anesthesia: General

## 2015-11-29 ENCOUNTER — Encounter: Payer: Self-pay | Admitting: Cardiovascular Disease

## 2015-11-29 ENCOUNTER — Encounter (INDEPENDENT_AMBULATORY_CARE_PROVIDER_SITE_OTHER): Payer: Self-pay

## 2015-11-29 ENCOUNTER — Ambulatory Visit (INDEPENDENT_AMBULATORY_CARE_PROVIDER_SITE_OTHER): Payer: Medicare Other | Admitting: Cardiovascular Disease

## 2015-11-29 VITALS — BP 158/60 | HR 74 | Ht 65.0 in | Wt 145.1 lb

## 2015-11-29 DIAGNOSIS — I480 Paroxysmal atrial fibrillation: Secondary | ICD-10-CM

## 2015-11-29 DIAGNOSIS — I10 Essential (primary) hypertension: Secondary | ICD-10-CM | POA: Diagnosis not present

## 2015-11-29 DIAGNOSIS — I481 Persistent atrial fibrillation: Secondary | ICD-10-CM | POA: Diagnosis not present

## 2015-11-29 DIAGNOSIS — I4819 Other persistent atrial fibrillation: Secondary | ICD-10-CM

## 2015-11-29 NOTE — Progress Notes (Signed)
Patient ID: Molly Hodges, female    DOB: 1932-04-25, 82 y.o.   MRN: [id number]  HPI Comments: Molly Hodges is a 82 y.o. female with  Paroxysmal Afib, COPD, asthma, prior episodes of PNA, palpitations with no prior diagnosis of Afib prior to her most recent hospital admission from 1/30 to 09/21/15, and HTN who presents  Follow-up for her atrial fibrillation, following cardioversion   Several weeks ago had cardioversion for atrial fibrillation  She was on amiodarone at the time, this was successful  She presents today and reports that she feels well in general, no complaints  She continues on amiodarone 200 mg twice a day  Compliant with her anticoagulation , eliquis   EKG on today's visit shows normal sinus rhythm with rate 68 bpm, prolonged QTC , no significant ST or T-wave changes   Other past medical history reviewed originally diagnosed with PNA in 03/2015, treated with ABX,   returned to PCP on 1/30 with worsening congestion, fever, and chills.  Tmax on 1/28 of 103. Temp at PCP was 100.7. She was sent to the ED for further evaluation.  HR at her PCP was found to be 130, no ECG obtained.   found to be in new onset Afib with RVR on 09/17/15 upon her arrival to [facility].  started on Eliquis given her stroke risk (CHADS2VASc of 4 - HTN, age x 2, female) and rate controlled.  Beta blockers were avoided given her asthma.  Started on thousand channel blocker  Echo showed EF of 60-65%, normal wall motion, not technically sufficient to allow for wall motion, mild MR, left atrium was mildly dilated, RV systolic function was normal, PASP was normal.    Allergies  Allergen Reactions  . Shellfish Allergy Anaphylaxis  . Azithromycin Other (See Comments)    Extreme burning sensation at IV site  . Tamiflu [Oseltamivir Phosphate] Other (See Comments)    Reaction:  Hallucinations     Current Outpatient Prescriptions on File Prior to Visit  Medication Sig Dispense Refill  . amiodarone  (PACERONE) 200 MG tablet Take 1 tablet (200 mg total) by mouth as directed. 90 tablet 3  . Calcium Carbonate-Vitamin D (CALCIUM 600+D) 600-400 MG-UNIT tablet Take 1 tablet by mouth daily.    . cholecalciferol (VITAMIN D) 1000 UNITS tablet Take 1,000 Units by mouth daily.    Marland Kitchen diltiazem (CARDIZEM CD) 180 MG 24 hr capsule TAKE ONE CAPSULE BY MOUTH DAILY 30 capsule 5  . dorzolamide (TRUSOPT) 2 % ophthalmic solution Place 1 drop into both eyes 2 (two) times daily.     Marland Kitchen ELIQUIS 5 MG TABS tablet TAKE ONE (1) TABLET BY MOUTH TWO (2) TIMES DAILY 60 tablet 5  . Fluticasone-Salmeterol (ADVAIR DISKUS) 250-50 MCG/DOSE AEPB Inhale 1 puff into the lungs 2 (two) times daily.    Marland Kitchen gabapentin (NEURONTIN) 100 MG capsule Take 400 mg by mouth at bedtime.    . hydrochlorothiazide (MICROZIDE) 12.5 MG capsule Take 12.5 mg by mouth daily.    Marland Kitchen latanoprost (XALATAN) 0.005 % ophthalmic solution Place 1 drop into both eyes at bedtime.     . magnesium oxide (MAG-OX) 400 MG tablet Take 400 mg by mouth daily.    . Omega-3 Fatty Acids (FISH OIL BURP-LESS) 1000 MG CAPS Take 1,000 mg by mouth daily.     . potassium chloride (K-DUR) 10 MEQ tablet Take 1 tablet (10 mEq total) by mouth daily. 30 tablet 6  . vitamin C (ASCORBIC ACID) 500 MG tablet Take 1,000  mg by mouth daily.    . vitamin E 100 UNIT capsule Take 200 Units by mouth daily.     No current facility-administered medications on file prior to visit.    Past Medical History  Diagnosis Date  . Pneumonia   . Hypertension   . Palpitation   . COPD (chronic obstructive pulmonary disease) ([place])   . Asthma   . Peripheral neuropathy ([facility])     in both feet  . New onset atrial fibrillation ([place]) 09/17/2015    a. On Eliquis  . Glaucoma     Past Surgical History  Procedure Laterality Date  . Abdominal hysterectomy  1987    due to heavy bleeding  . Appendectomy    . Tonsillectomy    . Cataract extraction    . Electrophysiologic study N/A 11/15/2015    Procedure:  CARDIOVERSION;  Surgeon: Minna Merritts, MD;  Location: [facility] ORS;  Service: Cardiovascular;  Laterality: N/A;    Social History  reports that she has never smoked. She has never used smokeless tobacco. She reports that she does not drink alcohol or use illicit drugs.  Family History family history includes CAD in her father and mother.   Review of Systems  Constitutional: Negative.   Respiratory: Negative.   Cardiovascular: Negative.   Gastrointestinal: Negative.   Musculoskeletal: Negative.   Neurological: Negative.   Hematological: Negative.   Psychiatric/Behavioral: Negative.   All other systems reviewed and are negative.   BP 158/60 mmHg  Pulse 74  Ht 5\' 5"  (1.651 m)  Wt 145 lb 1.9 oz (65.826 kg)  BMI 24.15 kg/m2  SpO2 98%   Physical Exam  Constitutional: She is oriented to person, place, and time. She appears well-developed and well-nourished.  HENT:  Head: Normocephalic.  Nose: Nose normal.  Mouth/Throat: Oropharynx is clear and moist.  Eyes: Conjunctivae are normal. Pupils are equal, round, and reactive to light.  Neck: Normal range of motion. Neck supple. No JVD present.  Cardiovascular: Normal rate, regular rhythm, normal heart sounds and intact distal pulses.  Exam reveals no gallop and no friction rub.   No murmur heard. Pulmonary/Chest: Effort normal and breath sounds normal. No respiratory distress. She has no wheezes. She has no rales. She exhibits no tenderness.  Abdominal: Soft. Bowel sounds are normal. She exhibits no distension. There is no tenderness.  Musculoskeletal: Normal range of motion. She exhibits no edema or tenderness.  Lymphadenopathy:    She has no cervical adenopathy.  Neurological: She is alert and oriented to person, place, and time. Coordination normal.  Skin: Skin is warm and dry. No rash noted. No erythema.  Psychiatric: She has a normal mood and affect. Her behavior is normal. Judgment and thought content normal.

## 2015-11-29 NOTE — Assessment & Plan Note (Signed)
Blood pressure elevated on today's visit even on recheck by myself  recommended she monitor blood pressure at home and if this continues to run high that she call our office for medication titration.  We would consider increasing her Cardizem up to 240 mg daily

## 2015-11-29 NOTE — Assessment & Plan Note (Signed)
Maintaining normal sinus rhythm Following cardioversion  recommended she decrease amiodarone down to 200 mg daily  suggested she continue on current dose of diltiazem but monitor blood pressure at home

## 2015-11-29 NOTE — Patient Instructions (Addendum)
You are doing well.  Please decrease the amiodarone down to one a day  Please monitor your blood pressure at home  Please call us if you have new issues that need to be addressed before your next appt.  Your physician wants you to follow-up in: 6 months.  You will receive a reminder letter in the mail two months in advance. If you don't receive a letter, please call our office to schedule the follow-up appointment.

## 2016-01-15 ENCOUNTER — Other Ambulatory Visit: Payer: Self-pay | Admitting: Family Medicine

## 2016-01-16 ENCOUNTER — Encounter: Payer: Self-pay | Admitting: Family Medicine

## 2016-01-16 ENCOUNTER — Ambulatory Visit (INDEPENDENT_AMBULATORY_CARE_PROVIDER_SITE_OTHER): Payer: Medicare Other | Admitting: Family Medicine

## 2016-01-16 VITALS — BP 124/74 | HR 71 | Temp 98.2°F | Resp 16 | Wt 151.0 lb

## 2016-01-16 DIAGNOSIS — J069 Acute upper respiratory infection, unspecified: Secondary | ICD-10-CM | POA: Diagnosis not present

## 2016-01-16 NOTE — Patient Instructions (Signed)
Upper Respiratory Infection, Adult Most upper respiratory infections (URIs) are a viral infection of the air passages leading to the lungs. A URI affects the nose, throat, and upper air passages. The most common type of URI is nasopharyngitis and is typically referred to as "the common cold." URIs run their course and usually go away on their own. Most of the time, a URI does not require medical attention, but sometimes a bacterial infection in the upper airways can follow a viral infection. This is called a secondary infection. Sinus and middle ear infections are common types of secondary upper respiratory infections. Bacterial pneumonia can also complicate a URI. A URI can worsen asthma and chronic obstructive pulmonary disease (COPD). Sometimes, these complications can require emergency medical care and may be life threatening.  CAUSES Almost all URIs are caused by viruses. A virus is a type of germ and can spread from one person to another.  RISKS FACTORS You may be at risk for a URI if:   You smoke.   You have chronic heart or lung disease.  You have a weakened defense (immune) system.   You are very young or very old.   You have nasal allergies or asthma.  You work in crowded or poorly ventilated areas.  You work in health care facilities or schools. SIGNS AND SYMPTOMS  Symptoms typically develop 2-3 days after you come in contact with a cold virus. Most viral URIs last 7-10 days. However, viral URIs from the influenza virus (flu virus) can last 14-18 days and are typically more severe. Symptoms may include:   Runny or stuffy (congested) nose.   Sneezing.   Cough.   Sore throat.   Headache.   Fatigue.   Fever.   Loss of appetite.   Pain in your forehead, behind your eyes, and over your cheekbones (sinus pain).  Muscle aches.  DIAGNOSIS  Your health care provider may diagnose a URI by:  Physical exam.  Tests to check that your symptoms are not due to  another condition such as:  Strep throat.  Sinusitis.  Pneumonia.  Asthma. TREATMENT  A URI goes away on its own with time. It cannot be cured with medicines, but medicines may be prescribed or recommended to relieve symptoms. Medicines may help:  Reduce your fever.  Reduce your cough.  Relieve nasal congestion. HOME CARE INSTRUCTIONS   Take medicines only as directed by your health care provider.   Gargle warm saltwater or take cough drops to comfort your throat as directed by your health care provider.  Use a warm mist humidifier or inhale steam from a shower to increase air moisture. This may make it easier to breathe.  Drink enough fluid to keep your urine clear or pale yellow.   Eat soups and other clear broths and maintain good nutrition.   Rest as needed.   Return to work when your temperature has returned to normal or as your health care provider advises. You may need to stay home longer to avoid infecting others. You can also use a face mask and careful hand washing to prevent spread of the virus.  Increase the usage of your inhaler if you have asthma.   Do not use any tobacco products, including cigarettes, chewing tobacco, or electronic cigarettes. If you need help quitting, ask your health care provider. PREVENTION  The best way to protect yourself from getting a cold is to practice good hygiene.   Avoid oral or hand contact with people with cold   symptoms.   Wash your hands often if contact occurs.  There is no clear evidence that vitamin C, vitamin E, echinacea, or exercise reduces the chance of developing a cold. However, it is always recommended to get plenty of rest, exercise, and practice good nutrition.  SEEK MEDICAL CARE IF:   You are getting worse rather than better.   Your symptoms are not controlled by medicine.   You have chills.  You have worsening shortness of breath.  You have brown or red mucus.  You have yellow or brown nasal  discharge.  You have pain in your face, especially when you bend forward.  You have a fever.  You have swollen neck glands.  You have pain while swallowing.  You have white areas in the back of your throat. SEEK IMMEDIATE MEDICAL CARE IF:   You have severe or persistent:  Headache.  Ear pain.  Sinus pain.  Chest pain.  You have chronic lung disease and any of the following:  Wheezing.  Prolonged cough.  Coughing up blood.  A change in your usual mucus.  You have a stiff neck.  You have changes in your:  Vision.  Hearing.  Thinking.  Mood. MAKE SURE YOU:   Understand these instructions.  Will watch your condition.  Will get help right away if you are not doing well or get worse.   This information is not intended to replace advice given to you by your health care provider. Make sure you discuss any questions you have with your health care provider.   Document Released: 01/28/2001 Document Revised: 12/19/2014 Document Reviewed: 11/09/2013 Elsevier Interactive Patient Education 2016 Elsevier Inc.  

## 2016-01-16 NOTE — Progress Notes (Signed)
Patient: Molly Hodges Female    DOB: [birth year]   82 y.o.   MRN: [id number] Visit Date: 01/16/2016  Today's Provider: Lelon Huh, MD   Chief Complaint  Patient presents with  . Sore Throat   Subjective:    Sore Throat  This is a new problem. The current episode started yesterday. The problem has been gradually worsening. There has been no fever. The pain is mild. Associated symptoms include congestion, coughing, headaches, a hoarse voice, neck pain and trouble swallowing. Pertinent negatives include no abdominal pain, diarrhea, drooling, ear discharge, ear pain, plugged ear sensation, shortness of breath, stridor, swollen glands or vomiting. She has had no exposure to strep or mono. She has tried nothing for the symptoms.   Sore throat started yesterday. Has gradually worsened with sinus pressure and headache. No fever.     Allergies  Allergen Reactions  . Shellfish Allergy Anaphylaxis  . Azithromycin Other (See Comments)    Extreme burning sensation at IV site  . Tamiflu [Oseltamivir Phosphate] Other (See Comments)    Reaction:  Hallucinations    Previous Medications   ADVAIR DISKUS 250-50 MCG/DOSE AEPB    INHALE ONE (1) PUFF EVERY TWELVE HOURS; RINSE MOUTH AFTER USE   AMIODARONE (PACERONE) 200 MG TABLET    Take 1 tablet (200 mg total) by mouth as directed.   CALCIUM CARBONATE-VITAMIN D (CALCIUM 600+D) 600-400 MG-UNIT TABLET    Take 1 tablet by mouth daily.   CHOLECALCIFEROL (VITAMIN D) 1000 UNITS TABLET    Take 1,000 Units by mouth daily.   DILTIAZEM (CARDIZEM CD) 180 MG 24 HR CAPSULE    TAKE ONE CAPSULE BY MOUTH DAILY   DORZOLAMIDE (TRUSOPT) 2 % OPHTHALMIC SOLUTION    Place 1 drop into both eyes 2 (two) times daily.    ELIQUIS 5 MG TABS TABLET    TAKE ONE (1) TABLET BY MOUTH TWO (2) TIMES DAILY   GABAPENTIN (NEURONTIN) 100 MG CAPSULE    Take 400 mg by mouth at bedtime.   HYDROCHLOROTHIAZIDE (MICROZIDE) 12.5 MG CAPSULE    Take 12.5 mg by mouth daily.   LATANOPROST (XALATAN) 0.005 % OPHTHALMIC SOLUTION    Place 1 drop into both eyes at bedtime.    MAGNESIUM OXIDE (MAG-OX) 400 MG TABLET    Take 400 mg by mouth daily.   OMEGA-3 FATTY ACIDS (FISH OIL BURP-LESS) 1000 MG CAPS    Take 1,000 mg by mouth daily.    POTASSIUM CHLORIDE (K-DUR) 10 MEQ TABLET    Take 1 tablet (10 mEq total) by mouth daily.   VITAMIN C (ASCORBIC ACID) 500 MG TABLET    Take 1,000 mg by mouth daily.   VITAMIN E 100 UNIT CAPSULE    Take 200 Units by mouth daily.    Review of Systems  Constitutional: Negative for fever, chills, appetite change and fatigue.  HENT: Positive for congestion, hoarse voice, sinus pressure, sore throat and trouble swallowing. Negative for drooling, ear discharge and ear pain.   Respiratory: Positive for cough. Negative for chest tightness, shortness of breath and stridor.   Cardiovascular: Negative for chest pain and palpitations.  Gastrointestinal: Negative for nausea, vomiting, abdominal pain and diarrhea.  Musculoskeletal: Positive for neck pain.  Neurological: Positive for headaches. Negative for dizziness and weakness.    Social History  Substance Use Topics  . Smoking status: Never Smoker   . Smokeless tobacco: Never Used  . Alcohol Use: No   Objective:   BP 124/74 mmHg  Pulse 71  Temp(Src) 98.2 F (36.8 C) (Oral)  Resp 16  Wt 151 lb (68.493 kg)  SpO2 98%  Physical Exam  General Appearance:    Alert, cooperative, no distress  HENT:   bilateral TM normal without fluid or infection, neck without nodes, pharynx erythematous without exudate, sinuses nontender and nasal mucosa pale and congested  Eyes:    PERRL, conjunctiva/corneas clear, EOM's intact       Lungs:     Clear to auscultation bilaterally, respirations unlabored  Heart:    Regular rate and rhythm  Neurologic:   Awake, alert, oriented x 3. No apparent focal neurological           defect.           Assessment & Plan:     1. Upper respiratory infection Symptoms are  currently  In throat and sinuses c/w viral URI. She has Netipot and gargles with salt water at home.  However she has had multiple bouts of pneumonia. Advised to call for Levofloxacin if she develops worsening cough, shortness of breath, cough productive yellow or green sputum.     The entirety of the information documented in the History of Present Illness, Review of Systems and Physical Exam were personally obtained by me. Portions of this information were initially documented by April M. Sabra Heck, CMA and reviewed by me for thoroughness and accuracy.    Lelon Huh, MD  [facility]

## 2016-01-18 ENCOUNTER — Telehealth: Payer: Self-pay | Admitting: Family Medicine

## 2016-01-18 MED ORDER — LEVOFLOXACIN 250 MG PO TABS: 500.0000 mg | ORAL_TABLET | Freq: Every day | ORAL | Status: DC

## 2016-01-18 MED ORDER — LEVOFLOXACIN 250 MG PO TABS: 250.0000 mg | ORAL_TABLET | Freq: Every day | ORAL | Status: DC

## 2016-01-18 MED ORDER — PREDNISONE 20 MG PO TABS: 20.0000 mg | ORAL_TABLET | Freq: Two times a day (BID) | ORAL | Status: DC

## 2016-01-18 NOTE — Telephone Encounter (Signed)
Please advised have sent in rx for prednisone and levoquin

## 2016-01-18 NOTE — Telephone Encounter (Signed)
Pt husband [name] called states pt seen Dr Caryn Section Wednesday for cough and congestion.  Pt is coughing worse and wheezing.  Pt husband is requesting a Rx to help with this.  [facility].  [id number]

## 2016-01-18 NOTE — Telephone Encounter (Signed)
Patient's husband [name] was notified.

## 2016-01-18 NOTE — Telephone Encounter (Signed)
Please advise 

## 2016-03-10 ENCOUNTER — Other Ambulatory Visit: Payer: Self-pay | Admitting: Family Medicine

## 2016-03-24 ENCOUNTER — Other Ambulatory Visit: Payer: Self-pay | Admitting: Family Medicine

## 2016-04-11 DIAGNOSIS — H401131 Primary open-angle glaucoma, bilateral, mild stage: Secondary | ICD-10-CM | POA: Diagnosis not present

## 2016-04-18 DIAGNOSIS — H401131 Primary open-angle glaucoma, bilateral, mild stage: Secondary | ICD-10-CM | POA: Diagnosis not present

## 2016-04-25 ENCOUNTER — Other Ambulatory Visit: Payer: Self-pay | Admitting: Physician Assistant

## 2016-05-29 ENCOUNTER — Ambulatory Visit (INDEPENDENT_AMBULATORY_CARE_PROVIDER_SITE_OTHER): Payer: Medicare Other | Admitting: Cardiovascular Disease

## 2016-05-29 ENCOUNTER — Encounter: Payer: Self-pay | Admitting: Cardiovascular Disease

## 2016-05-29 VITALS — BP 136/60 | HR 67 | Ht 65.0 in | Wt 150.5 lb

## 2016-05-29 DIAGNOSIS — I10 Essential (primary) hypertension: Secondary | ICD-10-CM

## 2016-05-29 DIAGNOSIS — Z7189 Other specified counseling: Secondary | ICD-10-CM | POA: Diagnosis not present

## 2016-05-29 DIAGNOSIS — I480 Paroxysmal atrial fibrillation: Secondary | ICD-10-CM | POA: Diagnosis not present

## 2016-05-29 DIAGNOSIS — I5031 Acute diastolic (congestive) heart failure: Secondary | ICD-10-CM | POA: Insufficient documentation

## 2016-05-29 DIAGNOSIS — R600 Localized edema: Secondary | ICD-10-CM | POA: Diagnosis not present

## 2016-05-29 MED ORDER — FUROSEMIDE 20 MG PO TABS: 20.0000 mg | ORAL_TABLET | Freq: Every day | ORAL | 6 refills | Status: DC | PRN

## 2016-05-29 MED ORDER — POTASSIUM CHLORIDE ER 10 MEQ PO TBCR: 10.0000 meq | EXTENDED_RELEASE_TABLET | Freq: Two times a day (BID) | ORAL | 6 refills | Status: DC | PRN

## 2016-05-29 NOTE — Patient Instructions (Addendum)
Medication Instructions:    Decrease fluid intake  FOR THREE DAYS In the morning  Take 2 of your hydrochlorothiazide pills 30 min later, Take furosemide/lasix one pill with 2 potassium pill  Then hold the furosemide  Take a fluid pill for weight of 148  Goal weight is 145  Please call if swelling does not improve  Labwork:  No new labs needed  Testing/Procedures:  No further testing at this time   Follow-Up: It was a pleasure seeing you in the office today. Please call us if you have new issues that need to be addressed before your next appt.  [phone]  Your physician wants you to follow-up in: 6 months.  You will receive a reminder letter in the mail two months in advance. If you don't receive a letter, please call our office to schedule the follow-up appointment.  If you need a refill on your cardiac medications before your next appointment, please call your pharmacy.

## 2016-05-29 NOTE — Progress Notes (Signed)
Cardiology Office Note  Date:  05/29/2016   ID:  Nanayaa, Ledet June 25, 1932, MRN [id number]  PCP:  Lelon Huh, MD   Chief Complaint  Patient presents with  . other    6 month f/u c/o allergy cough. Meds reviewed verbally with pt.    HPI:  Molly Hodges is a 82 y.o. female with  Paroxysmal Afib, COPD, asthma, prior episodes of PNA, palpitations with no prior diagnosis of Afib prior to her most recent hospital admission from 1/30 to 09/21/15, and HTN who presents  Follow-up for her atrial fibrillation,Previous cardioversion  In follow-up today she reports that she is doing well, She denies any tachycardia concerning for atrial fibrillation Feels well, no regular exercise program No shortness of breath  She has noticed increased lower extremity swelling over the past several weeks, family members also noticed this Weight is up 5 pounds from her baseline typically 145 pounds  Does not want flu shot, had a reaction   Compliant with her anticoagulation , eliquis   EKG on today's visit shows normal sinus rhythm with rate 67 bpm, prolonged QTC , no significant ST or T-wave changes   Other past medical history reviewed found to be in new onset Afib with RVR on 09/17/15 upon her arrival to [facility], pneumonia started on Eliquis given her stroke risk (CHADS2VASc of 4 - HTN, age x 2, female) and rate controlled.  Beta blockers were avoided given her asthma.  Started on thousand channel blocker  Echo showed EF of 60-65%, normal wall motion, not technically sufficient to allow for wall motion, mild MR, left atrium was mildly dilated, RV systolic function was normal, PASP was normal.    PMH:   has a past medical history of Asthma; COPD (chronic obstructive pulmonary disease) ([place]); Glaucoma; Hypertension; New onset atrial fibrillation ([place]) (09/17/2015); Palpitation; Peripheral neuropathy ([place]); and Pneumonia.  PSH:    Past Surgical History:  Procedure Laterality Date  .  ABDOMINAL HYSTERECTOMY  1987   due to heavy bleeding  . APPENDECTOMY    . CATARACT EXTRACTION    . ELECTROPHYSIOLOGIC STUDY N/A 11/15/2015   Procedure: CARDIOVERSION;  Surgeon: Minna Merritts, MD;  Location: [facility] ORS;  Service: Cardiovascular;  Laterality: N/A;  . TONSILLECTOMY      Current Outpatient Prescriptions  Medication Sig Dispense Refill  . ADVAIR DISKUS 250-50 MCG/DOSE AEPB INHALE ONE (1) PUFF EVERY TWELVE HOURS; RINSE MOUTH AFTER USE 60 each 12  . amiodarone (PACERONE) 200 MG tablet TAKE ONE TABLET BY MOUTH AS DIRECTED 90 tablet 3  . Calcium Carbonate-Vitamin D (CALCIUM 600+D) 600-400 MG-UNIT tablet Take 1 tablet by mouth daily.    . cholecalciferol (VITAMIN D) 1000 UNITS tablet Take 1,000 Units by mouth daily.    Marland Kitchen diltiazem (CARDIZEM CD) 180 MG 24 hr capsule TAKE 1 CAPSULE BY MOUTH EVERY DAY 30 capsule 12  . dorzolamide (TRUSOPT) 2 % ophthalmic solution Place 1 drop into both eyes 2 (two) times daily.     Marland Kitchen ELIQUIS 5 MG TABS tablet TAKE ONE TABLET BY MOUTH TWICE DAILY 60 tablet 12  . gabapentin (NEURONTIN) 100 MG capsule Take 400 mg by mouth at bedtime.    . hydrochlorothiazide (MICROZIDE) 12.5 MG capsule Take 12.5 mg by mouth daily.    Marland Kitchen latanoprost (XALATAN) 0.005 % ophthalmic solution Place 1 drop into both eyes at bedtime.     Marland Kitchen levofloxacin (LEVAQUIN) 250 MG tablet Take 1 tablet (250 mg total) by mouth daily. 10 tablet 0  .  magnesium oxide (MAG-OX) 400 MG tablet Take 400 mg by mouth daily.    . Omega-3 Fatty Acids (FISH OIL BURP-LESS) 1000 MG CAPS Take 1,000 mg by mouth daily.     . potassium chloride (K-DUR) 10 MEQ tablet Take 1 tablet (10 mEq total) by mouth 2 (two) times daily as needed. 60 tablet 6  . vitamin C (ASCORBIC ACID) 500 MG tablet Take 1,000 mg by mouth daily.    . vitamin E 100 UNIT capsule Take 200 Units by mouth daily.    . furosemide (LASIX) 20 MG tablet Take 1 tablet (20 mg total) by mouth daily as needed. 30 tablet 6   No current  facility-administered medications for this visit.      Allergies:   Shellfish allergy; Azithromycin; and Tamiflu [oseltamivir phosphate]   Social History:  The patient  reports that she has never smoked. She has never used smokeless tobacco. She reports that she does not drink alcohol or use drugs.   Family History:   family history includes CAD in her father and mother.    Review of Systems: Review of Systems  Constitutional: Negative.        Weight gain  Respiratory: Negative.   Cardiovascular: Positive for leg swelling.  Gastrointestinal: Negative.   Musculoskeletal: Negative.   Neurological: Negative.   Psychiatric/Behavioral: Negative.   All other systems reviewed and are negative.    PHYSICAL EXAM: VS:  BP 136/60 (BP Location: Left Arm, Patient Position: Sitting, Cuff Size: Normal) Comment: Retake after 10 mins.  Pulse 67   Ht 5\' 5"  (1.651 m)   Wt 150 lb 8 oz (68.3 kg)   BMI 25.04 kg/m  , BMI Body mass index is 25.04 kg/m. GEN: Well nourished, well developed, in no acute distress  HEENT: normal  Neck: no JVD, carotid bruits, or masses Cardiac: RRR; no murmurs, rubs, or gallops, 1+ edema to the mid shins bilaterally, pitting Respiratory:  clear to auscultation bilaterally, normal work of breathing GI: soft, nontender, nondistended, + BS MS: no deformity or atrophy  Skin: warm and dry, no rash Neuro:  Strength and sensation are intact Psych: euthymic mood, full affect    Recent Labs: 09/17/2015: ALT 20 09/20/2015: Magnesium 2.3 11/09/2015: BUN 20; Creatinine, Ser 0.84; Hemoglobin 13.7; Platelets 208; Potassium 4.0; Sodium 139    Lipid Panel No results found for: CHOL, HDL, LDLCALC, TRIG    Wt Readings from Last 3 Encounters:  05/29/16 150 lb 8 oz (68.3 kg)  01/16/16 151 lb (68.5 kg)  11/29/15 145 lb 1.9 oz (65.8 kg)       ASSESSMENT AND PLAN:  Paroxysmal atrial fibrillation (HCC) - Plan: EKG 12-Lead Maintaining normal sinus rhythm, will stay on  anticoagulation She will monitor her heart rate for any arrhythmia, recommended she stay on diltiazem at current dose for now  Essential hypertension Initial blood pressure elevated, improved on repeat check Unclear if diltiazem is contributing to leg swelling If no improvement with diuretics, may need to hold the diltiazem  Encounter for anticoagulation discussion and counseling No complications from anticoagulation. We'll continue eliquis  Lower extremity edema Significant lower extremity edema on today's visit, 1+ pitting to mid shins, 5 pound weight gain. Recommended for 3 days she take HCTZ 25 mg and 30 minutes later take Lasix 20 mg with 2 potassium After 3 days, would go back to HCTZ 12.5 mill grams daily Would take Lasix 20 mg as needed for 3 pound weight gain over 145 pounds which is her goal  weight  CHF education provided. Recommended low salt, low fluid  Acute diastolic CHF (congestive heart failure) (HCC) As above, leg swelling likely secondary to acute systolic CHF If no improvement with diuretics, may need to hold the Cardizem, change to beta blockers   Total encounter time more than 25 minutes  Greater than 50% was spent in counseling and coordination of care with the patient   Disposition:   F/U  6 months   Orders Placed This Encounter  Procedures  . EKG 12-Lead     Signed, Esmond Plants, M.D., Ph.D. 05/29/2016  [place], [place]

## 2016-07-21 ENCOUNTER — Ambulatory Visit (INDEPENDENT_AMBULATORY_CARE_PROVIDER_SITE_OTHER): Payer: Medicare Other | Admitting: Family Medicine

## 2016-07-21 ENCOUNTER — Encounter: Payer: Self-pay | Admitting: Family Medicine

## 2016-07-21 VITALS — BP 120/60 | HR 81 | Temp 98.7°F | Resp 16 | Wt 150.0 lb

## 2016-07-21 DIAGNOSIS — J40 Bronchitis, not specified as acute or chronic: Secondary | ICD-10-CM | POA: Diagnosis not present

## 2016-07-21 DIAGNOSIS — R059 Cough, unspecified: Secondary | ICD-10-CM

## 2016-07-21 DIAGNOSIS — R05 Cough: Secondary | ICD-10-CM | POA: Diagnosis not present

## 2016-07-21 MED ORDER — LEVOFLOXACIN 250 MG PO TABS: 250.0000 mg | ORAL_TABLET | Freq: Every day | ORAL | 0 refills | Status: DC

## 2016-07-21 NOTE — Progress Notes (Signed)
Patient: Molly Hodges Female    DOB: Dec 19, 1931   82 y.o.   MRN: [id number] Visit Date: 07/21/2016  Today's Provider: Lelon Huh, MD   Chief Complaint  Patient presents with  . Cough  . Fever   Subjective:    Low grade fever and cough started Friday 07/18/2016. Patient stated that cough is productive. Patient also stated that she has been running a low grade fever off and on. Symptoms of muscle aches, headaches, and wheezing. Patient has not taken anything otc.    Cough  This is a new problem. The problem has been gradually worsening. The problem occurs every few minutes. The cough is productive of sputum. Associated symptoms include a fever, headaches, myalgias and wheezing. Pertinent negatives include no chest pain, chills, ear congestion, ear pain, heartburn, hemoptysis, nasal congestion, postnasal drip, rash, rhinorrhea, sore throat, shortness of breath, sweats or weight loss. Nothing aggravates the symptoms. She has tried nothing for the symptoms. Her past medical history is significant for bronchitis and pneumonia.  Fever   Associated symptoms include coughing, headaches and wheezing. Pertinent negatives include no abdominal pain, chest pain, ear pain, nausea, rash, sore throat or vomiting.   She does have history of recurrent pneumonia which she feels made her feel the same way she feels today.     Allergies  Allergen Reactions  . Shellfish Allergy Anaphylaxis  . Azithromycin Other (See Comments)    Extreme burning sensation at IV site  . Tamiflu [Oseltamivir Phosphate] Other (See Comments)    Reaction:  Hallucinations      Current Outpatient Prescriptions:  .  ADVAIR DISKUS 250-50 MCG/DOSE AEPB, INHALE ONE (1) PUFF EVERY TWELVE HOURS; RINSE MOUTH AFTER USE, Disp: 60 each, Rfl: 12 .  amiodarone (PACERONE) 200 MG tablet, TAKE ONE TABLET BY MOUTH AS DIRECTED, Disp: 90 tablet, Rfl: 3 .  Calcium Carbonate-Vitamin D (CALCIUM 600+D) 600-400 MG-UNIT tablet, Take 1  tablet by mouth daily., Disp: , Rfl:  .  cholecalciferol (VITAMIN D) 1000 UNITS tablet, Take 1,000 Units by mouth daily., Disp: , Rfl:  .  diltiazem (CARDIZEM CD) 180 MG 24 hr capsule, TAKE 1 CAPSULE BY MOUTH EVERY DAY, Disp: 30 capsule, Rfl: 12 .  dorzolamide (TRUSOPT) 2 % ophthalmic solution, Place 1 drop into both eyes 2 (two) times daily. , Disp: , Rfl:  .  ELIQUIS 5 MG TABS tablet, TAKE ONE TABLET BY MOUTH TWICE DAILY, Disp: 60 tablet, Rfl: 12 .  furosemide (LASIX) 20 MG tablet, Take 1 tablet (20 mg total) by mouth daily as needed., Disp: 30 tablet, Rfl: 6 .  gabapentin (NEURONTIN) 100 MG capsule, Take 400 mg by mouth at bedtime., Disp: , Rfl:  .  hydrochlorothiazide (MICROZIDE) 12.5 MG capsule, Take 12.5 mg by mouth daily., Disp: , Rfl:  .  latanoprost (XALATAN) 0.005 % ophthalmic solution, Place 1 drop into both eyes at bedtime. , Disp: , Rfl:  .  levofloxacin (LEVAQUIN) 250 MG tablet, Take 1 tablet (250 mg total) by mouth daily., Disp: 10 tablet, Rfl: 0 .  magnesium oxide (MAG-OX) 400 MG tablet, Take 400 mg by mouth daily., Disp: , Rfl:  .  Omega-3 Fatty Acids (FISH OIL BURP-LESS) 1000 MG CAPS, Take 1,000 mg by mouth daily. , Disp: , Rfl:  .  potassium chloride (K-DUR) 10 MEQ tablet, Take 1 tablet (10 mEq total) by mouth 2 (two) times daily as needed., Disp: 60 tablet, Rfl: 6 .  vitamin C (ASCORBIC ACID) 500 MG tablet,  Take 1,000 mg by mouth daily., Disp: , Rfl:  .  vitamin E 100 UNIT capsule, Take 200 Units by mouth daily., Disp: , Rfl:   Review of Systems  Constitutional: Positive for fever. Negative for appetite change, chills, fatigue and weight loss.  HENT: Negative for ear pain, postnasal drip, rhinorrhea and sore throat.   Respiratory: Positive for cough and wheezing. Negative for hemoptysis, chest tightness and shortness of breath.   Cardiovascular: Negative for chest pain and palpitations.  Gastrointestinal: Negative for abdominal pain, heartburn, nausea and vomiting.    Musculoskeletal: Positive for myalgias.  Skin: Negative for rash.  Neurological: Positive for headaches. Negative for dizziness and weakness.    Social History  Substance Use Topics  . Smoking status: Never Smoker  . Smokeless tobacco: Never Used  . Alcohol use No   Objective:   BP 120/60 (BP Location: Left Arm, Patient Position: Sitting, Cuff Size: Normal)   Pulse 81   Temp 98.7 F (37.1 C) (Oral)   Resp 16   Wt 150 lb (68 kg)   SpO2 96%   BMI 24.96 kg/m   Physical Exam  General Appearance:    Alert, cooperative, no distress  HENT:   ENT exam normal, no neck nodes or sinus tenderness  Eyes:    PERRL, conjunctiva/corneas clear, EOM's intact       Lungs:     Occasional expiratory wheeze, no rales,   respirations unlabored  Heart:    Regular rate and rhythm  Neurologic:   Awake, alert, oriented x 3. No apparent focal neurological           defect.           Assessment & Plan:     1. Bronchitis  - levofloxacin (LEVAQUIN) 250 MG tablet; Take 1 tablet (250 mg total) by mouth daily.  Dispense: 10 tablet; Refill: 0  2. Cough  - levofloxacin (LEVAQUIN) 250 MG tablet; Take 1 tablet (250 mg total) by mouth daily.  Dispense: 10 tablet; Refill: 0     The entirety of the information documented in the History of Present Illness, Review of Systems and Physical Exam were personally obtained by me. Portions of this information were initially documented by April M. Sabra Heck, CMA and reviewed by me for thoroughness and accuracy.    Lelon Huh, MD  [facility]

## 2016-07-24 ENCOUNTER — Other Ambulatory Visit: Payer: Self-pay | Admitting: Family Medicine

## 2016-08-02 ENCOUNTER — Other Ambulatory Visit: Payer: Self-pay | Admitting: Family Medicine

## 2016-08-21 ENCOUNTER — Encounter: Payer: Self-pay | Admitting: Physician Assistant

## 2016-08-21 ENCOUNTER — Ambulatory Visit
Admission: RE | Admit: 2016-08-21 | Discharge: 2016-08-21 | Disposition: A | Discharge status: Home / Self Care | Payer: Medicare Other | Source: Ambulatory Visit | Attending: Physician Assistant | Admitting: Physician Assistant

## 2016-08-21 ENCOUNTER — Ambulatory Visit (INDEPENDENT_AMBULATORY_CARE_PROVIDER_SITE_OTHER): Payer: Medicare Other | Admitting: Physician Assistant

## 2016-08-21 ENCOUNTER — Telehealth: Payer: Self-pay

## 2016-08-21 VITALS — BP 128/60 | HR 76 | Temp 98.0°F | Resp 16 | Wt 151.0 lb

## 2016-08-21 DIAGNOSIS — R059 Cough, unspecified: Secondary | ICD-10-CM

## 2016-08-21 DIAGNOSIS — R52 Pain, unspecified: Secondary | ICD-10-CM | POA: Diagnosis not present

## 2016-08-21 DIAGNOSIS — R509 Fever, unspecified: Secondary | ICD-10-CM | POA: Diagnosis not present

## 2016-08-21 DIAGNOSIS — R05 Cough: Secondary | ICD-10-CM

## 2016-08-21 DIAGNOSIS — J449 Chronic obstructive pulmonary disease, unspecified: Secondary | ICD-10-CM | POA: Diagnosis not present

## 2016-08-21 DIAGNOSIS — R079 Chest pain, unspecified: Secondary | ICD-10-CM | POA: Diagnosis not present

## 2016-08-21 MED ORDER — HYDROCODONE-HOMATROPINE 5-1.5 MG/5ML PO SYRP
5.0000 mL | ORAL_SOLUTION | Freq: Every evening | ORAL | 0 refills | Status: DC | PRN
Start: 2016-08-21 — End: 2016-11-11

## 2016-08-21 NOTE — Telephone Encounter (Signed)
Pt advised of results. States she feels better than she did last night. Renaldo Fiddler, CMA

## 2016-08-21 NOTE — Progress Notes (Signed)
[place]  Chief Complaint  Patient presents with  . URI    Started about two days ago.     Subjective:    Patient ID: Molly Hodges, female    DOB: [birth year], 83 y.o.   MRN: [id number]  Upper Respiratory Infection: Molly Hodges is a 83 y.o. female with a past medical history significant for history of pneumonia, Asthma complaining of symptoms of a URI. She was seen in clinic on 07/21/2016, diagnosed with bronchitis and given Levofloxacin 250 mg QD x 10 days.   Today she reports  symptoms include congestion and cough. Onset of symptoms was 2 days ago, unchanged since that time. She also c/o congestion, cough described as nonproductive and low grade fever for the past 2 days . No nausea or vomiting. Eating and drinking okay. No sick contacts. Feels better today. She is drinking plenty of fluids. Evaluation to date: none. Treatment to date: decongestants. The treatment has provided minimal.   Review of Systems  Constitutional: Positive for fatigue and fever (Temp 99.9 ). Negative for activity change, appetite change, chills, diaphoresis and unexpected weight change.  HENT: Positive for congestion, postnasal drip, sinus pain, sinus pressure, sore throat and voice change. Negative for ear discharge, ear pain, hearing loss, nosebleeds, rhinorrhea, sneezing, tinnitus and trouble swallowing.   Eyes: Negative.   Respiratory: Positive for cough, chest tightness, shortness of breath and wheezing. Negative for apnea, choking and stridor.   Gastrointestinal: Negative.   Musculoskeletal: Positive for myalgias.  Neurological: Positive for light-headedness and headaches. Negative for dizziness.       Objective:   BP 128/60 (BP Location: Left Arm, Patient Position: Sitting, Cuff Size: Normal)   Pulse 76   Temp 98 F (36.7 C) (Oral)   Resp 16   Wt 151 lb (68.5 kg)   BMI 25.13 kg/m   Patient Active Problem List   Diagnosis Date Noted  . Lower extremity edema 05/29/2016   . Acute diastolic CHF (congestive heart failure) ([place]) 05/29/2016  . Persistent atrial fibrillation ([place])   . Atrial fibrillation ([place])   . New onset atrial fibrillation ([place])   . Community acquired pneumonia   . Elevated troponin   . Hypoxia   . Encounter for anticoagulation discussion and counseling   . Sepsis ([place]) 09/17/2015  . Pneumonia 04/05/2015  . Premature ventricular contraction 03/26/2015  . Vitamin D deficiency 03/26/2015  . Neuropathy ([place]) 03/26/2015  . Hypertension 03/26/2015  . Asthma 03/26/2015    Outpatient Encounter Prescriptions as of 08/21/2016  Medication Sig  . ADVAIR DISKUS 250-50 MCG/DOSE AEPB INHALE ONE (1) PUFF EVERY TWELVE HOURS; RINSE MOUTH AFTER USE  . amiodarone (PACERONE) 200 MG tablet TAKE ONE TABLET BY MOUTH AS DIRECTED  . Calcium Carbonate-Vitamin D (CALCIUM 600+D) 600-400 MG-UNIT tablet Take 1 tablet by mouth daily.  . cholecalciferol (VITAMIN D) 1000 UNITS tablet Take 1,000 Units by mouth daily.  Marland Kitchen diltiazem (CARDIZEM CD) 180 MG 24 hr capsule TAKE 1 CAPSULE BY MOUTH EVERY DAY  . dorzolamide (TRUSOPT) 2 % ophthalmic solution Place 1 drop into both eyes 2 (two) times daily.   Marland Kitchen ELIQUIS 5 MG TABS tablet TAKE ONE TABLET BY MOUTH TWICE DAILY  . furosemide (LASIX) 20 MG tablet Take 1 tablet (20 mg total) by mouth daily as needed.  . gabapentin (NEURONTIN) 100 MG capsule Take 400 mg by mouth at bedtime.  . gabapentin (NEURONTIN) 300 MG capsule TAKE 1 CAPSULE BY MOUTH EVERY MORNING. TAKE 2 CAPSULES BY MOUTH AT  BEDTIME  . hydrochlorothiazide (MICROZIDE) 12.5 MG capsule TAKE 1 CAPSULE BY MOUTH EVERY DAY  . latanoprost (XALATAN) 0.005 % ophthalmic solution Place 1 drop into both eyes at bedtime.   . magnesium oxide (MAG-OX) 400 MG tablet Take 400 mg by mouth daily.  . Omega-3 Fatty Acids (FISH OIL BURP-LESS) 1000 MG CAPS Take 1,000 mg by mouth daily.   . potassium chloride (K-DUR) 10 MEQ tablet Take 1 tablet (10 mEq total) by mouth 2 (two) times daily as  needed.  . vitamin C (ASCORBIC ACID) 500 MG tablet Take 1,000 mg by mouth daily.  . vitamin E 100 UNIT capsule Take 200 Units by mouth daily.  Marland Kitchen HYDROcodone-homatropine (HYCODAN) 5-1.5 MG/5ML syrup Take 5 mLs by mouth at bedtime as needed for cough.  . [DISCONTINUED] levofloxacin (LEVAQUIN) 250 MG tablet Take 1 tablet (250 mg total) by mouth daily.   No facility-administered encounter medications on file as of 08/21/2016.     Allergies  Allergen Reactions  . Shellfish Allergy Anaphylaxis  . Azithromycin Other (See Comments)    Extreme burning sensation at IV site  . Tamiflu [Oseltamivir Phosphate] Other (See Comments)    Reaction:  Hallucinations        Physical Exam  Constitutional: She is oriented to person, place, and time. She appears well-developed and well-nourished.  HENT:  Right Ear: External ear normal.  Left Ear: External ear normal.  Mouth/Throat: Oropharynx is clear and moist. No oropharyngeal exudate.  Eyes: Right eye exhibits discharge. Left eye exhibits discharge.  Neck: Neck supple.  Cardiovascular: Normal rate and regular rhythm.   Pulmonary/Chest: Effort normal and breath sounds normal. No respiratory distress. She has no wheezes. She has no rales.  Lymphadenopathy:    She has no cervical adenopathy.  Neurological: She is alert and oriented to person, place, and time.  Skin: Skin is warm and dry.  Psychiatric: She has a normal mood and affect. Her behavior is normal.       Assessment & Plan:   1. Cough  Symptoms align with bronchitis, but due to past history of pneumonia and patient's age, will evaluate further with chest xray. Gave Hycodan at night for cough, counseled on sedation risks. Return precautions advised.  - DG Chest 2 View; Future - HYDROcodone-homatropine (HYCODAN) 5-1.5 MG/5ML syrup; Take 5 mLs by mouth at bedtime as needed for cough.  Dispense: 120 mL; Refill: 0  2. Fever, unspecified fever cause  See above.   3. Body aches  See  above.  Return if symptoms worsen or fail to improve.   There are no Patient Instructions on file for this visit.   The entirety of the information documented in the History of Present Illness, Review of Systems and Physical Exam were personally obtained by me. Portions of this information were initially documented by Ashley Royalty, CMA and reviewed by me for thoroughness and accuracy.

## 2016-08-21 NOTE — Telephone Encounter (Signed)
-----   Message from Trinna Post, [place] sent at 08/21/2016  2:57 PM EST ----- There are some chronic lung changes but no evidence of pneumonia. Patient can continue symptomatic treatment and should call if she starts to feel worse.

## 2016-08-22 ENCOUNTER — Telehealth: Payer: Self-pay | Admitting: Physician Assistant

## 2016-08-22 DIAGNOSIS — R059 Cough, unspecified: Secondary | ICD-10-CM

## 2016-08-22 DIAGNOSIS — R05 Cough: Secondary | ICD-10-CM

## 2016-08-22 MED ORDER — DOXYCYCLINE HYCLATE 100 MG PO TABS
100.0000 mg | ORAL_TABLET | Freq: Two times a day (BID) | ORAL | 0 refills | Status: AC
Start: 2016-08-22 — End: 2016-08-29

## 2016-08-22 MED ORDER — DOXYCYCLINE HYCLATE 100 MG PO TABS: 100.0000 mg | ORAL_TABLET | Freq: Two times a day (BID) | ORAL | 0 refills | Status: DC

## 2016-08-22 NOTE — Telephone Encounter (Signed)
Please review. Molly Hodges, CMA  

## 2016-08-22 NOTE — Telephone Encounter (Signed)
Pt's husband [name] stated pt saw Molly Hodges yesterday and isn't feeling any better and is now running a fever. Pharmacy: [facility]. Please advise. Thanks TNP

## 2016-08-22 NOTE — Telephone Encounter (Signed)
Pt advised. Emily Drozdowski, CMA  

## 2016-08-22 NOTE — Telephone Encounter (Signed)
Excuse my mistake I want her to take one pill twice a day for seven days.

## 2016-08-22 NOTE — Telephone Encounter (Signed)
Sent in doxycycline to pharmacy. CXR is clear but this should cover for respiratory illness/pneumonia. One pill twice daily x 10 days.

## 2016-09-06 ENCOUNTER — Encounter: Payer: Self-pay | Admitting: Family Medicine

## 2016-09-06 ENCOUNTER — Ambulatory Visit (INDEPENDENT_AMBULATORY_CARE_PROVIDER_SITE_OTHER): Payer: Medicare Other | Admitting: Family Medicine

## 2016-09-06 VITALS — BP 118/60 | HR 76 | Temp 98.2°F | Resp 16 | Wt 150.0 lb

## 2016-09-06 DIAGNOSIS — I482 Chronic atrial fibrillation, unspecified: Secondary | ICD-10-CM

## 2016-09-06 DIAGNOSIS — J410 Simple chronic bronchitis: Secondary | ICD-10-CM

## 2016-09-06 MED ORDER — PREDNISONE 10 MG (21) PO TBPK: ORAL_TABLET | ORAL | 1 refills | Status: DC

## 2016-09-06 MED ORDER — AMOXICILLIN-POT CLAVULANATE 875-125 MG PO TABS: 1.0000 | ORAL_TABLET | Freq: Two times a day (BID) | ORAL | 0 refills | Status: DC

## 2016-09-06 NOTE — Progress Notes (Signed)
Patient: Molly Hodges Female    DOB: Dec 16, 1931   83 y.o.   MRN: [id number] Visit Date: 09/06/2016  Today's Provider: Wilhemena Durie, MD   Chief Complaint  Patient presents with  . Cough   Subjective:    Cough  The current episode started 1 to 4 weeks ago (x 3 weeks). Progression since onset: Pt was feeling better, but is now worsening. The cough is productive of sputum (clear). Associated symptoms include chills, headaches, nasal congestion, postnasal drip, rhinorrhea and shortness of breath. Pertinent negatives include no chest pain, ear congestion, ear pain, fever, hemoptysis, myalgias, sore throat, sweats, weight loss or wheezing. Her past medical history is significant for asthma, bronchitis and pneumonia.  Pt saw Adriana on 08/21/2016. Pt was diagnosed with bronchitis. Pt was prescribed Hycodan and Doxycycline. Pt's CXR at that time was negative.     Allergies  Allergen Reactions  . Shellfish Allergy Anaphylaxis  . Azithromycin Other (See Comments)    Extreme burning sensation at IV site  . Tamiflu [Oseltamivir Phosphate] Other (See Comments)    Reaction:  Hallucinations      Current Outpatient Prescriptions:  .  ADVAIR DISKUS 250-50 MCG/DOSE AEPB, INHALE ONE (1) PUFF EVERY TWELVE HOURS; RINSE MOUTH AFTER USE, Disp: 60 each, Rfl: 12 .  amiodarone (PACERONE) 200 MG tablet, TAKE ONE TABLET BY MOUTH AS DIRECTED, Disp: 90 tablet, Rfl: 3 .  Calcium Carbonate-Vitamin D (CALCIUM 600+D) 600-400 MG-UNIT tablet, Take 1 tablet by mouth daily., Disp: , Rfl:  .  cholecalciferol (VITAMIN D) 1000 UNITS tablet, Take 1,000 Units by mouth daily., Disp: , Rfl:  .  diltiazem (CARDIZEM CD) 180 MG 24 hr capsule, TAKE 1 CAPSULE BY MOUTH EVERY DAY, Disp: 30 capsule, Rfl: 12 .  dorzolamide (TRUSOPT) 2 % ophthalmic solution, Place 1 drop into both eyes 2 (two) times daily. , Disp: , Rfl:  .  ELIQUIS 5 MG TABS tablet, TAKE ONE TABLET BY MOUTH TWICE DAILY, Disp: 60 tablet, Rfl: 12 .   furosemide (LASIX) 20 MG tablet, Take 1 tablet (20 mg total) by mouth daily as needed., Disp: 30 tablet, Rfl: 6 .  gabapentin (NEURONTIN) 100 MG capsule, Take 400 mg by mouth at bedtime., Disp: , Rfl:  .  gabapentin (NEURONTIN) 300 MG capsule, TAKE 1 CAPSULE BY MOUTH EVERY MORNING. TAKE 2 CAPSULES BY MOUTH AT BEDTIME, Disp: 90 capsule, Rfl: 4 .  hydrochlorothiazide (MICROZIDE) 12.5 MG capsule, TAKE 1 CAPSULE BY MOUTH EVERY DAY, Disp: 30 capsule, Rfl: 11 .  latanoprost (XALATAN) 0.005 % ophthalmic solution, Place 1 drop into both eyes at bedtime. , Disp: , Rfl:  .  magnesium oxide (MAG-OX) 400 MG tablet, Take 400 mg by mouth daily., Disp: , Rfl:  .  Omega-3 Fatty Acids (FISH OIL BURP-LESS) 1000 MG CAPS, Take 1,000 mg by mouth daily. , Disp: , Rfl:  .  potassium chloride (K-DUR) 10 MEQ tablet, Take 1 tablet (10 mEq total) by mouth 2 (two) times daily as needed., Disp: 60 tablet, Rfl: 6 .  vitamin C (ASCORBIC ACID) 500 MG tablet, Take 1,000 mg by mouth daily., Disp: , Rfl:  .  vitamin E 100 UNIT capsule, Take 200 Units by mouth daily., Disp: , Rfl:  .  HYDROcodone-homatropine (HYCODAN) 5-1.5 MG/5ML syrup, Take 5 mLs by mouth at bedtime as needed for cough. (Patient not taking: Reported on 09/06/2016), Disp: 120 mL, Rfl: 0  Review of Systems  Constitutional: Positive for chills. Negative for fever and weight  loss.  HENT: Positive for postnasal drip and rhinorrhea. Negative for ear pain and sore throat.   Respiratory: Positive for cough and shortness of breath. Negative for hemoptysis and wheezing.   Cardiovascular: Negative for chest pain.  Musculoskeletal: Negative for myalgias.  Neurological: Positive for headaches.  Psychiatric/Behavioral: Negative.     Social History  Substance Use Topics  . Smoking status: Never Smoker  . Smokeless tobacco: Never Used  . Alcohol use No   Objective:   BP 118/60 (BP Location: Right Arm, Patient Position: Sitting, Cuff Size: Normal)   Pulse 76   Temp 98.2  F (36.8 C) (Oral)   Resp 16   Wt 150 lb (68 kg)   SpO2 97%   BMI 24.96 kg/m   Physical Exam  Constitutional: She is oriented to person, place, and time. She appears well-developed and well-nourished.  HENT:  Head: Normocephalic and atraumatic.  Right Ear: External ear normal.  Left Ear: External ear normal.  Nose: Nose normal.  Mouth/Throat: Oropharynx is clear and moist.  Eyes: Conjunctivae are normal.  Neck: Neck supple.  Cardiovascular: Normal rate, regular rhythm and normal heart sounds.   Pulmonary/Chest: Effort normal and breath sounds normal.  Abdominal: Soft.  Lymphadenopathy:    She has no cervical adenopathy.  Neurological: She is alert and oriented to person, place, and time.  Skin: Skin is warm and dry.  Psychiatric: She has a normal mood and affect. Her behavior is normal. Judgment and thought content normal.        Assessment & Plan:     Chronic bronchitis Due to patient's Gait history will treat with Augmentin. She is to call back for further workup if she worsens.     I have done the exam and reviewed the above chart and it is accurate to the best of my knowledge. Development worker, community has been used in this note in any air is in the dictation or transcription are unintentional.  Wilhemena Durie, MD  [place]

## 2016-10-16 DIAGNOSIS — H401131 Primary open-angle glaucoma, bilateral, mild stage: Secondary | ICD-10-CM | POA: Diagnosis not present

## 2016-10-16 DIAGNOSIS — H353131 Nonexudative age-related macular degeneration, bilateral, early dry stage: Secondary | ICD-10-CM | POA: Diagnosis not present

## 2016-11-05 ENCOUNTER — Ambulatory Visit (INDEPENDENT_AMBULATORY_CARE_PROVIDER_SITE_OTHER): Payer: Medicare Other

## 2016-11-05 VITALS — BP 152/56 | HR 68 | Temp 97.6°F | Ht 65.0 in | Wt 149.4 lb

## 2016-11-05 DIAGNOSIS — Z0000 Encounter for general adult medical examination without abnormal findings: Secondary | ICD-10-CM | POA: Diagnosis not present

## 2016-11-05 NOTE — Patient Instructions (Signed)
[name] , Thank you for taking time to come for your Medicare Wellness Visit. I appreciate your ongoing commitment to your health goals. Please review the following plan we discussed and let me know if I can assist you in the future.   Screening recommendations/referrals: Colonoscopy: done 05/30/14 Mammogram: done 05/30/14 Bone Density: Declined Recommended yearly ophthalmology/optometry visit for glaucoma screening and checkup Recommended yearly dental visit for hygiene and checkup  Vaccinations: Influenza vaccine: Declined Pneumococcal vaccine: Completed series Tdap vaccine: Declined  Advanced directives: Requested copy   Next appointment: None   Preventive Care 16 Years and Older, Female Preventive care refers to lifestyle choices and visits with your health care provider that can promote health and wellness. What does preventive care include?  A yearly physical exam. This is also called an annual well check.  Dental exams once or twice a year.  Routine eye exams. Ask your health care provider how often you should have your eyes checked.  Personal lifestyle choices, including:  Daily care of your teeth and gums.  Regular physical activity.  Eating a healthy diet.  Avoiding tobacco and drug use.  Limiting alcohol use.  Practicing safe sex.  Taking low-dose aspirin every day.  Taking vitamin and mineral supplements as recommended by your health care provider. What happens during an annual well check? The services and screenings done by your health care provider during your annual well check will depend on your age, overall health, lifestyle risk factors, and family history of disease. Counseling  Your health care provider may ask you questions about your:  Alcohol use.  Tobacco use.  Drug use.  Emotional well-being.  Home and relationship well-being.  Sexual activity.  Eating habits.  History of falls.  Memory and ability to understand  (cognition).  Work and work Statistician.  Reproductive health. Screening  You may have the following tests or measurements:  Height, weight, and BMI.  Blood pressure.  Lipid and cholesterol levels. These may be checked every 5 years, or more frequently if you are over 60 years old.  Skin check.  Lung cancer screening. You may have this screening every year starting at age 99 if you have a 30-pack-year history of smoking and currently smoke or have quit within the past 15 years.  Fecal occult blood test (FOBT) of the stool. You may have this test every year starting at age 52.  Flexible sigmoidoscopy or colonoscopy. You may have a sigmoidoscopy every 5 years or a colonoscopy every 10 years starting at age 49.  Hepatitis C blood test.  Hepatitis B blood test.  Sexually transmitted disease (STD) testing.  Diabetes screening. This is done by checking your blood sugar (glucose) after you have not eaten for a while (fasting). You may have this done every 1-3 years.  Bone density scan. This is done to screen for osteoporosis. You may have this done starting at age 47.  Mammogram. This may be done every 1-2 years. Talk to your health care provider about how often you should have regular mammograms. Talk with your health care provider about your test results, treatment options, and if necessary, the need for more tests. Vaccines  Your health care provider may recommend certain vaccines, such as:  Influenza vaccine. This is recommended every year.  Tetanus, diphtheria, and acellular pertussis (Tdap, Td) vaccine. You may need a Td booster every 10 years.  Zoster vaccine. You may need this after age 45.  Pneumococcal 13-valent conjugate (PCV13) vaccine. One dose is recommended after  age 34.  Pneumococcal polysaccharide (PPSV23) vaccine. One dose is recommended after age 7. Talk to your health care provider about which screenings and vaccines you need and how often you need  them. This information is not intended to replace advice given to you by your health care provider. Make sure you discuss any questions you have with your health care provider. Document Released: 08/31/2015 Document Revised: 04/23/2016 Document Reviewed: 06/05/2015 Elsevier Interactive Patient Education  2017 [place] Prevention in the Home Falls can cause injuries. They can happen to people of all ages. There are many things you can do to make your home safe and to help prevent falls. What can I do on the outside of my home?  Regularly fix the edges of walkways and driveways and fix any cracks.  Remove anything that might make you trip as you walk through a door, such as a raised step or threshold.  Trim any bushes or trees on the path to your home.  Use bright outdoor lighting.  Clear any walking paths of anything that might make someone trip, such as rocks or tools.  Regularly check to see if handrails are loose or broken. Make sure that both sides of any steps have handrails.  Any raised decks and porches should have guardrails on the edges.  Have any leaves, snow, or ice cleared regularly.  Use sand or salt on walking paths during winter.  Clean up any spills in your garage right away. This includes oil or grease spills. What can I do in the bathroom?  Use night lights.  Install grab bars by the toilet and in the tub and shower. Do not use towel bars as grab bars.  Use non-skid mats or decals in the tub or shower.  If you need to sit down in the shower, use a plastic, non-slip stool.  Keep the floor dry. Clean up any water that spills on the floor as soon as it happens.  Remove soap buildup in the tub or shower regularly.  Attach bath mats securely with double-sided non-slip rug tape.  Do not have throw rugs and other things on the floor that can make you trip. What can I do in the bedroom?  Use night lights.  Make sure that you have a light by your  bed that is easy to reach.  Do not use any sheets or blankets that are too big for your bed. They should not hang down onto the floor.  Have a firm chair that has side arms. You can use this for support while you get dressed.  Do not have throw rugs and other things on the floor that can make you trip. What can I do in the kitchen?  Clean up any spills right away.  Avoid walking on wet floors.  Keep items that you use a lot in easy-to-reach places.  If you need to reach something above you, use a strong step stool that has a grab bar.  Keep electrical cords out of the way.  Do not use floor polish or wax that makes floors slippery. If you must use wax, use non-skid floor wax.  Do not have throw rugs and other things on the floor that can make you trip. What can I do with my stairs?  Do not leave any items on the stairs.  Make sure that there are handrails on both sides of the stairs and use them. Fix handrails that are broken or loose. Make sure that  handrails are as long as the stairways.  Check any carpeting to make sure that it is firmly attached to the stairs. Fix any carpet that is loose or worn.  Avoid having throw rugs at the top or bottom of the stairs. If you do have throw rugs, attach them to the floor with carpet tape.  Make sure that you have a light switch at the top of the stairs and the bottom of the stairs. If you do not have them, ask someone to add them for you. What else can I do to help prevent falls?  Wear shoes that:  Do not have high heels.  Have rubber bottoms.  Are comfortable and fit you well.  Are closed at the toe. Do not wear sandals.  If you use a stepladder:  Make sure that it is fully opened. Do not climb a closed stepladder.  Make sure that both sides of the stepladder are locked into place.  Ask someone to hold it for you, if possible.  Clearly mark and make sure that you can see:  Any grab bars or handrails.  First and last  steps.  Where the edge of each step is.  Use tools that help you move around (mobility aids) if they are needed. These include:  Canes.  Walkers.  Scooters.  Crutches.  Turn on the lights when you go into a dark area. Replace any light bulbs as soon as they burn out.  Set up your furniture so you have a clear path. Avoid moving your furniture around.  If any of your floors are uneven, fix them.  If there are any pets around you, be aware of where they are.  Review your medicines with your doctor. Some medicines can make you feel dizzy. This can increase your chance of falling. Ask your doctor what other things that you can do to help prevent falls. This information is not intended to replace advice given to you by your health care provider. Make sure you discuss any questions you have with your health care provider. Document Released: 05/31/2009 Document Revised: 01/10/2016 Document Reviewed: 09/08/2014 Elsevier Interactive Patient Education  2017 [employer].

## 2016-11-05 NOTE — Progress Notes (Signed)
Subjective:   Molly Hodges is a 83 y.o. female who presents for Medicare Annual (Subsequent) preventive examination.  Review of Systems:  N/A  Cardiac Risk Factors include: advanced age (>54men, >88 women);hypertension     Objective:     Vitals: BP (!) 152/56 (BP Location: Left Arm)   Pulse 68   Temp 97.6 F (36.4 C) (Oral)   Ht 5\' 5"  (1.651 m)   Wt 149 lb 6.4 oz (67.8 kg)   BMI 24.86 kg/m   Body mass index is 24.86 kg/m.   Tobacco History  Smoking Status  . Never Smoker  Smokeless Tobacco  . Never Used     Counseling given: Not Answered   Past Medical History:  Diagnosis Date  . Asthma   . COPD (chronic obstructive pulmonary disease) ([place])   . Glaucoma   . Hypertension   . New onset atrial fibrillation ([place]) 09/17/2015   a. On Eliquis  . Palpitation   . Peripheral neuropathy ([facility])    in both feet  . Pneumonia    Past Surgical History:  Procedure Laterality Date  . ABDOMINAL HYSTERECTOMY  1987   due to heavy bleeding  . APPENDECTOMY    . CATARACT EXTRACTION    . ELECTROPHYSIOLOGIC STUDY N/A 11/15/2015   Procedure: CARDIOVERSION;  Surgeon: Minna Merritts, MD;  Location: [facility] ORS;  Service: Cardiovascular;  Laterality: N/A;  . TONSILLECTOMY     Family History  Problem Relation Age of Onset  . CAD Mother   . CAD Father    History  Sexual Activity  . Sexual activity: Not on file    Outpatient Encounter Prescriptions as of 11/05/2016  Medication Sig  . ADVAIR DISKUS 250-50 MCG/DOSE AEPB INHALE ONE (1) PUFF EVERY TWELVE HOURS; RINSE MOUTH AFTER USE  . amiodarone (PACERONE) 200 MG tablet TAKE ONE TABLET BY MOUTH AS DIRECTED  . Calcium Carbonate-Vitamin D (CALCIUM 600+D) 600-400 MG-UNIT tablet Take 1 tablet by mouth daily.  . cholecalciferol (VITAMIN D) 1000 UNITS tablet Take 1,000 Units by mouth daily.  Marland Kitchen diltiazem (CARDIZEM CD) 180 MG 24 hr capsule TAKE 1 CAPSULE BY MOUTH EVERY DAY  . dorzolamide (TRUSOPT) 2 % ophthalmic solution Place 1  drop into both eyes 2 (two) times daily.   Marland Kitchen ELIQUIS 5 MG TABS tablet TAKE ONE TABLET BY MOUTH TWICE DAILY  . furosemide (LASIX) 20 MG tablet Take 1 tablet (20 mg total) by mouth daily as needed.  . hydrochlorothiazide (MICROZIDE) 12.5 MG capsule TAKE 1 CAPSULE BY MOUTH EVERY DAY  . latanoprost (XALATAN) 0.005 % ophthalmic solution Place 1 drop into both eyes at bedtime.   . magnesium oxide (MAG-OX) 400 MG tablet Take 400 mg by mouth daily.  . Omega-3 Fatty Acids (FISH OIL BURP-LESS) 1000 MG CAPS Take 1,000 mg by mouth daily.   . potassium chloride (K-DUR) 10 MEQ tablet Take 1 tablet (10 mEq total) by mouth 2 (two) times daily as needed.  . vitamin C (ASCORBIC ACID) 500 MG tablet Take 1,000 mg by mouth daily.  . vitamin E 100 UNIT capsule Take 200 Units by mouth daily.  Marland Kitchen amoxicillin-clavulanate (AUGMENTIN) 875-125 MG tablet Take 1 tablet by mouth 2 (two) times daily. (Patient not taking: Reported on 11/05/2016)  . gabapentin (NEURONTIN) 100 MG capsule Take 300 mg by mouth at bedtime.   . gabapentin (NEURONTIN) 300 MG capsule TAKE 1 CAPSULE BY MOUTH EVERY MORNING. TAKE 2 CAPSULES BY MOUTH AT BEDTIME (Patient not taking: Reported on 11/05/2016)  . HYDROcodone-homatropine (  HYCODAN) 5-1.5 MG/5ML syrup Take 5 mLs by mouth at bedtime as needed for cough. (Patient not taking: Reported on 09/06/2016)  . predniSONE (STERAPRED UNI-PAK 21 TAB) 10 MG (21) TBPK tablet Taper as directed over 6 days. 6-5-4-3-2-1 (Patient not taking: Reported on 11/05/2016)   No facility-administered encounter medications on file as of 11/05/2016.     Activities of Daily Living In your present state of health, do you have any difficulty performing the following activities: 11/05/2016  Hearing? N  Vision? N  Difficulty concentrating or making decisions? N  Walking or climbing stairs? N  Dressing or bathing? N  Doing errands, shopping? N  Preparing Food and eating ? N  Using the Toilet? N  In the past six months, have you  accidently leaked urine? N  Do you have problems with loss of bowel control? N  Managing your Medications? N  Managing your Finances? N  Housekeeping or managing your Housekeeping? N  Some recent data might be hidden    Patient Care Team: Birdie Sons, MD as PCP - General (Family Medicine) Minna Merritts, MD as Consulting Physician (Cardiology) Leandrew Koyanagi, MD as Referring Physician (Ophthalmology)    Assessment:    Exercise Activities and Dietary recommendations Current Exercise Habits: The patient does not participate in regular exercise at present, Exercise limited by: None identified  Goals    . water intake          Recommend continuing to drink 6 glasses of water a day.       Fall Risk Fall Risk  11/05/2016  Falls in the past year? No   Depression Screen PHQ 2/9 Scores 11/05/2016 11/05/2016  PHQ - 2 Score 0 0  PHQ- 9 Score 0 -     Cognitive Function     6CIT Screen 11/05/2016  What Year? 0 points  What month? 0 points  What time? 0 points  Count back from 20 0 points  Months in reverse 4 points  Repeat phrase 0 points  Total Score 4    Immunization History  Administered Date(s) Administered  . Pneumococcal Conjugate-13 05/30/2014  . Pneumococcal Polysaccharide-23 06/09/1985, 06/15/2006   Screening Tests Health Maintenance  Topic Date Due  . INFLUENZA VACCINE  08/18/2026 (Originally 03/18/2016)  . TETANUS/TDAP  08/18/2026 (Originally 10/20/1950)  . DEXA SCAN  Completed  . PNA vac Low Risk Adult  Completed      Plan:  I have personally reviewed and addressed the Medicare Annual Wellness questionnaire and have noted the following in the patient's chart:  A. Medical and social history B. Use of alcohol, tobacco or illicit drugs  C. Current medications and supplements D. Functional ability and status E.  Nutritional status F.  Physical activity G. Advance directives H. List of other physicians I.  Hospitalizations, surgeries, and ER visits  in previous 12 months J.  [facility] such as hearing and vision if needed, cognitive and depression L. Referrals and appointments - none  In addition, I have reviewed and discussed with patient certain preventive protocols, quality metrics, and best practice recommendations. A written personalized care plan for preventive services as well as general preventive health recommendations were provided to patient.  See attached scanned questionnaire for additional information.   Signed,  Fabio Neighbors, LPN Nurse Health Advisor   MD Recommendations: None. Pt declined tetanus and influenza vaccine.

## 2016-11-11 ENCOUNTER — Encounter: Payer: Self-pay | Admitting: Family Medicine

## 2016-11-11 ENCOUNTER — Ambulatory Visit (INDEPENDENT_AMBULATORY_CARE_PROVIDER_SITE_OTHER): Payer: Medicare Other | Admitting: Family Medicine

## 2016-11-11 VITALS — BP 102/70 | HR 77 | Temp 97.6°F | Resp 16 | Wt 149.0 lb

## 2016-11-11 DIAGNOSIS — I481 Persistent atrial fibrillation: Secondary | ICD-10-CM

## 2016-11-11 DIAGNOSIS — I10 Essential (primary) hypertension: Secondary | ICD-10-CM

## 2016-11-11 DIAGNOSIS — J449 Chronic obstructive pulmonary disease, unspecified: Secondary | ICD-10-CM | POA: Diagnosis not present

## 2016-11-11 DIAGNOSIS — E559 Vitamin D deficiency, unspecified: Secondary | ICD-10-CM | POA: Diagnosis not present

## 2016-11-11 DIAGNOSIS — J209 Acute bronchitis, unspecified: Secondary | ICD-10-CM | POA: Insufficient documentation

## 2016-11-11 DIAGNOSIS — Z79899 Other long term (current) drug therapy: Secondary | ICD-10-CM | POA: Diagnosis not present

## 2016-11-11 DIAGNOSIS — G629 Polyneuropathy, unspecified: Secondary | ICD-10-CM

## 2016-11-11 DIAGNOSIS — J45909 Unspecified asthma, uncomplicated: Secondary | ICD-10-CM | POA: Diagnosis not present

## 2016-11-11 DIAGNOSIS — J440 Chronic obstructive pulmonary disease with acute lower respiratory infection: Secondary | ICD-10-CM | POA: Insufficient documentation

## 2016-11-11 DIAGNOSIS — I4819 Other persistent atrial fibrillation: Secondary | ICD-10-CM

## 2016-11-11 MED ORDER — ACLIDINIUM BROMIDE 400 MCG/ACT IN AEPB: 1.0000 | INHALATION_SPRAY | Freq: Two times a day (BID) | RESPIRATORY_TRACT | 12 refills | Status: DC

## 2016-11-11 NOTE — Progress Notes (Signed)
Patient: Molly Hodges Female    DOB: July 05, 1932   83 y.o.   MRN: [id number] Visit Date: 11/11/2016  Today's Provider: Lelon Huh, MD   Chief Complaint  Patient presents with  . Follow-up  . Hypertension  . Atrial Fibrillation  . Asthma   Subjective:    HPI   Asthma exacerbation From 08/22/2015-started predniSONE taper, due to exacerbation. She states her breathing has returned to its baseline, but still gets very short of breath especially when gardening and has to limit her physical activities. She is taking the Advair every day.   Atrial  Fibrillation  From 09/28/2015-no changes. Followed by Dr. Rockey Situ. .  Vitamin D Deficiency Current treatment: calcium with vitamin D. No results found for: VD25OH    Hypertension, follow-up:  BP Readings from Last 3 Encounters:  11/11/16 102/70  11/05/16 (!) 152/56  09/06/16 118/60    She was last seen for hypertension 5 months ago.  BP at that visit was 152/56. Management since that visit includes; followed by cardiology.She reports good compliance with treatment. She is not having side effects. none She is exercising. She is adherent to low salt diet.   Outside blood pressures are 120/70. She is experiencing none.  Patient denies none.   Cardiovascular risk factors include none.  Use of agents associated with hypertension: none.   ----------------------------------------------------------------    Allergies  Allergen Reactions  . Shellfish Allergy Anaphylaxis  . Azithromycin Other (See Comments)    Extreme burning sensation at IV site  . Tamiflu [Oseltamivir Phosphate] Other (See Comments)    Reaction:  Hallucinations      Current Outpatient Prescriptions:  .  ADVAIR DISKUS 250-50 MCG/DOSE AEPB, INHALE ONE (1) PUFF EVERY TWELVE HOURS; RINSE MOUTH AFTER USE, Disp: 60 each, Rfl: 12 .  amiodarone (PACERONE) 200 MG tablet, TAKE ONE TABLET BY MOUTH AS DIRECTED, Disp: 90 tablet, Rfl: 3 .  Calcium  Carbonate-Vitamin D (CALCIUM 600+D) 600-400 MG-UNIT tablet, Take 1 tablet by mouth daily., Disp: , Rfl:  .  cholecalciferol (VITAMIN D) 1000 UNITS tablet, Take 1,000 Units by mouth daily., Disp: , Rfl:  .  diltiazem (CARDIZEM CD) 180 MG 24 hr capsule, TAKE 1 CAPSULE BY MOUTH EVERY DAY, Disp: 30 capsule, Rfl: 12 .  dorzolamide (TRUSOPT) 2 % ophthalmic solution, Place 1 drop into both eyes 2 (two) times daily. , Disp: , Rfl:  .  ELIQUIS 5 MG TABS tablet, TAKE ONE TABLET BY MOUTH TWICE DAILY, Disp: 60 tablet, Rfl: 12 .  furosemide (LASIX) 20 MG tablet, Take 1 tablet (20 mg total) by mouth daily as needed., Disp: 30 tablet, Rfl: 6 .  gabapentin (NEURONTIN) 100 MG capsule, Take 300 mg by mouth at bedtime. , Disp: , Rfl:  .  gabapentin (NEURONTIN) 300 MG capsule, TAKE 1 CAPSULE BY MOUTH EVERY MORNING. TAKE 2 CAPSULES BY MOUTH AT BEDTIME, Disp: 90 capsule, Rfl: 4 .  hydrochlorothiazide (MICROZIDE) 12.5 MG capsule, TAKE 1 CAPSULE BY MOUTH EVERY DAY, Disp: 30 capsule, Rfl: 11 .  latanoprost (XALATAN) 0.005 % ophthalmic solution, Place 1 drop into both eyes at bedtime. , Disp: , Rfl:  .  magnesium oxide (MAG-OX) 400 MG tablet, Take 400 mg by mouth daily., Disp: , Rfl:  .  Omega-3 Fatty Acids (FISH OIL BURP-LESS) 1000 MG CAPS, Take 1,000 mg by mouth daily. , Disp: , Rfl:  .  potassium chloride (K-DUR) 10 MEQ tablet, Take 1 tablet (10 mEq total) by mouth 2 (two) times daily  as needed., Disp: 60 tablet, Rfl: 6 .  vitamin C (ASCORBIC ACID) 500 MG tablet, Take 1,000 mg by mouth daily., Disp: , Rfl:  .  vitamin E 100 UNIT capsule, Take 200 Units by mouth daily., Disp: , Rfl:    Review of Systems  Constitutional: Negative for appetite change, chills, fatigue and fever.  Respiratory: Negative for chest tightness and shortness of breath.   Cardiovascular: Negative for chest pain and palpitations.  Gastrointestinal: Negative for abdominal pain, nausea and vomiting.  Neurological: Negative for dizziness and  weakness.    Social History  Substance Use Topics  . Smoking status: Never Smoker  . Smokeless tobacco: Never Used  . Alcohol use No   Objective:   BP 102/70 (BP Location: Right Arm, Patient Position: Sitting, Cuff Size: Normal)   Pulse 77   Temp 97.6 F (36.4 C) (Oral)   Resp 16   Wt 149 lb (67.6 kg)   SpO2 99%   BMI 24.79 kg/m     Physical Exam   General Appearance:    Alert, cooperative, no distress  Eyes:    PERRL, conjunctiva/corneas clear, EOM's intact       Lungs:     Clear to auscultation bilaterally, respirations unlabored  Heart:    Regular rate and rhythm  Neurologic:   Awake, alert, oriented x 3. No apparent focal neurological           defect.           Assessment & Plan:     1. Vitamin D deficiency  - VITAMIN D 25 Hydroxy (Vit-D Deficiency, Fractures)  2. Long-term use of high-risk medication  - Spirometry with Graph - Comprehensive metabolic panel - T4 AND TSH - CBC  3. Essential hypertension Well controlled.  Continue current medications.   - Comprehensive metabolic panel - T4 AND TSH - CBC  4. Persistent atrial fibrillation (HCC) Controlled on current amiodarone managed by Dr. Rockey Situ - Comprehensive metabolic panel - T4 AND TSH - CBC  5. Peripheral polyneuropathy (HCC) Doing well with current dose of gabapentin  6. Chronic obstructive pulmonary disease, unspecified COPD type ([place]) She is having significant limitations in her physical activity due to shortness of breath, although resting oximetry is very good. Will try adding samples of Tudorza 1 puff twice a day and given prescription to fill if effective.   7. Uncomplicated asthma, unspecified asthma severity, unspecified whether persistent Continue bid Advair, she reports she does not tolerating albuterol due to it making her heart race.   Counseled on potential benefits of BMD screening, which she adamantly refused to have ordered.       Lelon Huh, MD  [facility]

## 2016-11-11 NOTE — Patient Instructions (Addendum)
   You should have a bone density test to screen for osteoporosis. Please call our office at [phone] to schedule this screening test at your earliest convenience.     Osteoporosis Osteoporosis is the thinning and loss of density in the bones. Osteoporosis makes the bones more brittle, fragile, and likely to break (fracture). Over time, osteoporosis can cause the bones to become so weak that they fracture after a simple fall. The bones most likely to fracture are the bones in the hip, wrist, and spine. What are the causes? The exact cause is not known. What increases the risk? Anyone can develop osteoporosis. You may be at greater risk if you have a family history of the condition or have poor nutrition. You may also have a higher risk if you are:  Female.  16 years old or older.  A smoker.  Not physically active.  White or Asian.  Slender. What are the signs or symptoms? A fracture might be the first sign of the disease, especially if it results from a fall or injury that would not usually cause a bone to break. Other signs and symptoms include:  Low back and neck pain.  Stooped posture.  Height loss. How is this diagnosed? To make a diagnosis, your health care provider may:  Take a medical history.  Perform a physical exam.  Order tests, such as:  A bone mineral density test.  A dual-energy X-ray absorptiometry test. How is this treated? The goal of osteoporosis treatment is to strengthen your bones to reduce your risk of a fracture. Treatment may involve:  Making lifestyle changes, such as:  Eating a diet rich in calcium.  Doing weight-bearing and muscle-strengthening exercises.  Stopping tobacco use.  Limiting alcohol intake.  Taking medicine to slow the process of bone loss or to increase bone density.  Monitoring your levels of calcium and vitamin D. Follow these instructions at home:  Include calcium and vitamin D in your diet. Calcium is  important for bone health, and vitamin D helps the body absorb calcium.  Perform weight-bearing and muscle-strengthening exercises as directed by your health care provider.  Do not use any tobacco products, including cigarettes, chewing tobacco, and electronic cigarettes. If you need help quitting, ask your health care provider.  Limit your alcohol intake.  Take medicines only as directed by your health care provider.  Keep all follow-up visits as directed by your health care provider. This is important.  Take precautions at home to lower your risk of falling, such as:  Keeping rooms well lit and clutter free.  Installing safety rails on stairs.  Using rubber mats in the bathroom and other areas that are often wet or slippery. Get help right away if: You fall or injure yourself. This information is not intended to replace advice given to you by your health care provider. Make sure you discuss any questions you have with your health care provider. Document Released: 05/14/2005 Document Revised: 01/07/2016 Document Reviewed: 01/12/2014 Elsevier Interactive Patient Education  2017 [employer].

## 2016-11-13 DIAGNOSIS — Z79899 Other long term (current) drug therapy: Secondary | ICD-10-CM | POA: Diagnosis not present

## 2016-11-13 DIAGNOSIS — I481 Persistent atrial fibrillation: Secondary | ICD-10-CM | POA: Diagnosis not present

## 2016-11-13 DIAGNOSIS — I10 Essential (primary) hypertension: Secondary | ICD-10-CM | POA: Diagnosis not present

## 2016-11-13 DIAGNOSIS — E559 Vitamin D deficiency, unspecified: Secondary | ICD-10-CM | POA: Diagnosis not present

## 2016-11-14 ENCOUNTER — Telehealth: Payer: Self-pay

## 2016-11-14 LAB — COMPREHENSIVE METABOLIC PANEL
ALT: 31 IU/L (ref 0–32)
AST: 23 IU/L (ref 0–40)
Albumin/Globulin Ratio: 2 (ref 1.2–2.2)
Albumin: 4.4 g/dL (ref 3.5–4.7)
Alkaline Phosphatase: 84 IU/L (ref 39–117)
BUN/Creatinine Ratio: 18 (ref 12–28)
BUN: 18 mg/dL (ref 8–27)
Bilirubin Total: 0.3 mg/dL (ref 0.0–1.2)
CO2: 27 mmol/L (ref 18–29)
Calcium: 9 mg/dL (ref 8.7–10.3)
Chloride: 102 mmol/L (ref 96–106)
Creatinine, Ser: 1.01 mg/dL — ABNORMAL HIGH (ref 0.57–1.00)
GFR calc Af Amer: 59 mL/min/{1.73_m2} — ABNORMAL LOW (ref >59)
GFR calc non Af Amer: 52 mL/min/{1.73_m2} — ABNORMAL LOW (ref >59)
Globulin, Total: 2.2 g/dL (ref 1.5–4.5)
Glucose: 104 mg/dL — ABNORMAL HIGH (ref 65–99)
Potassium: 4.3 mmol/L (ref 3.5–5.2)
Sodium: 144 mmol/L (ref 134–144)
Total Protein: 6.6 g/dL (ref 6.0–8.5)

## 2016-11-14 LAB — CBC
Hematocrit: 43.3 % (ref 34.0–46.6)
Hemoglobin: 14.3 g/dL (ref 11.1–15.9)
MCH: 27.9 pg (ref 26.6–33.0)
MCHC: 33 g/dL (ref 31.5–35.7)
MCV: 84 fL (ref 79–97)
Platelets: 236 10*3/uL (ref 150–379)
RBC: 5.13 x10E6/uL (ref 3.77–5.28)
RDW: 15.3 % (ref 12.3–15.4)
WBC: 7.8 10*3/uL (ref 3.4–10.8)

## 2016-11-14 LAB — T4 AND TSH
T4, Total: 10.8 ug/dL (ref 4.5–12.0)
TSH: 5.51 u[IU]/mL — ABNORMAL HIGH (ref 0.450–4.500)

## 2016-11-14 LAB — VITAMIN D 25 HYDROXY (VIT D DEFICIENCY, FRACTURES): Vit D, 25-Hydroxy: 28.7 ng/mL — ABNORMAL LOW (ref 30.0–100.0)

## 2016-11-14 NOTE — Telephone Encounter (Signed)
Pt advised.   Thanks,   -Laura  

## 2016-11-14 NOTE — Telephone Encounter (Signed)
-----   Message from Birdie Sons, MD sent at 11/14/2016  7:14 AM EDT ----- Labs are good. Slightly low vitamin d level. Continue current medications and vitamin d supplements. Follow up  6 months.

## 2016-11-25 DIAGNOSIS — L820 Inflamed seborrheic keratosis: Secondary | ICD-10-CM | POA: Diagnosis not present

## 2016-11-25 DIAGNOSIS — L814 Other melanin hyperpigmentation: Secondary | ICD-10-CM | POA: Diagnosis not present

## 2016-11-25 DIAGNOSIS — L570 Actinic keratosis: Secondary | ICD-10-CM | POA: Diagnosis not present

## 2016-11-25 DIAGNOSIS — L821 Other seborrheic keratosis: Secondary | ICD-10-CM | POA: Diagnosis not present

## 2016-11-27 ENCOUNTER — Encounter: Payer: Self-pay | Admitting: Cardiovascular Disease

## 2016-11-27 ENCOUNTER — Ambulatory Visit (INDEPENDENT_AMBULATORY_CARE_PROVIDER_SITE_OTHER): Payer: Medicare Other | Admitting: Cardiovascular Disease

## 2016-11-27 VITALS — BP 126/50 | HR 71 | Ht 65.0 in | Wt 149.2 lb

## 2016-11-27 DIAGNOSIS — Z7189 Other specified counseling: Secondary | ICD-10-CM | POA: Diagnosis not present

## 2016-11-27 DIAGNOSIS — I480 Paroxysmal atrial fibrillation: Secondary | ICD-10-CM | POA: Diagnosis not present

## 2016-11-27 DIAGNOSIS — J432 Centrilobular emphysema: Secondary | ICD-10-CM | POA: Diagnosis not present

## 2016-11-27 DIAGNOSIS — I10 Essential (primary) hypertension: Secondary | ICD-10-CM

## 2016-11-27 NOTE — Patient Instructions (Signed)
Medication Instructions:   No medication changes made  cetirazine, generic of zyrtec  Labwork:  No new labs needed  Testing/Procedures:  No further testing at this time   I recommend watching educational videos on topics of interest to you at:       [email or web address]  Enter code: HEARTCARE    Follow-Up: It was a pleasure seeing you in the office today. Please call us if you have new issues that need to be addressed before your next appt.  [phone]  Your physician wants you to follow-up in: 12 months.  You will receive a reminder letter in the mail two months in advance. If you don't receive a letter, please call our office to schedule the follow-up appointment.  If you need a refill on your cardiac medications before your next appointment, please call your pharmacy.

## 2016-11-27 NOTE — Progress Notes (Signed)
Cardiology Office Note  Date:  11/27/2016   ID:  Haley, Fuerstenberg March 28, 1932, MRN [id number]  PCP:  Lelon Huh, MD   Chief Complaint  Patient presents with  . other    31mo f/u. Pt states she is doing well. Reviewed meds with pt verbally.    HPI:  Molly Hodges is a 83 y.o. female with  Paroxysmal Afib,  COPD,  asthma,  prior episodes of PNA,   hospital admission from 1/30 to 09/21/15,  HTN  who presents Follow-up for her atrial fibrillation,Previous cardioversion  In follow-up today she reports that she is doing well, No regular exercise program Denies any tachycardia or palpitations Likes her medications, no side effects No chest pain or shortness of breath No significant leg swelling, weight has been stable Compliant with her anticoagulation , eliquis  EKG personally reviewed by myself on todays visit Shows normal sinus rhythm with rate 71 bpm no significant ST or T-wave changes  Other past medical history reviewed found to be in new onset Afib with RVR on 09/17/15 upon her arrival to [facility], pneumonia started on Eliquis given her stroke risk (CHADS2VASc of 4 - HTN, age x 2, female) and rate controlled.  Beta blockers were avoided given her asthma. Started on thousand channel blocker  Echo showed EF of 60-65%, normal wall motion, not technically sufficient to allow for wall motion, mild MR, left atrium was mildly dilated, RV systolic function was normal, PASP was normal.    PMH:   has a past medical history of Community acquired pneumonia; Glaucoma; New onset atrial fibrillation ([address]) (09/17/2015); and Sepsis ([place]) (09/17/2015).  PSH:    Past Surgical History:  Procedure Laterality Date  . ABDOMINAL HYSTERECTOMY  1987   due to heavy bleeding  . APPENDECTOMY    . CATARACT EXTRACTION    . ELECTROPHYSIOLOGIC STUDY N/A 11/15/2015   Procedure: CARDIOVERSION;  Surgeon: Minna Merritts, MD;  Location: [facility] ORS;  Service: Cardiovascular;  Laterality: N/A;   . TONSILLECTOMY      Current Outpatient Prescriptions  Medication Sig Dispense Refill  . Aclidinium Bromide (TUDORZA PRESSAIR) 400 MCG/ACT AEPB Inhale 1 puff into the lungs 2 (two) times daily. 1 each 12  . ADVAIR DISKUS 250-50 MCG/DOSE AEPB INHALE ONE (1) PUFF EVERY TWELVE HOURS; RINSE MOUTH AFTER USE 60 each 12  . amiodarone (PACERONE) 200 MG tablet TAKE ONE TABLET BY MOUTH AS DIRECTED 90 tablet 3  . Calcium Carbonate-Vitamin D (CALCIUM 600+D) 600-400 MG-UNIT tablet Take 1 tablet by mouth daily.    . cholecalciferol (VITAMIN D) 1000 UNITS tablet Take 1,000 Units by mouth daily.    Marland Kitchen diltiazem (CARDIZEM CD) 180 MG 24 hr capsule TAKE 1 CAPSULE BY MOUTH EVERY DAY 30 capsule 12  . dorzolamide (TRUSOPT) 2 % ophthalmic solution Place 1 drop into both eyes 2 (two) times daily.     Marland Kitchen ELIQUIS 5 MG TABS tablet TAKE ONE TABLET BY MOUTH TWICE DAILY 60 tablet 12  . furosemide (LASIX) 20 MG tablet Take 1 tablet (20 mg total) by mouth daily as needed. 30 tablet 6  . gabapentin (NEURONTIN) 100 MG capsule Take 300 mg by mouth at bedtime.     . gabapentin (NEURONTIN) 300 MG capsule TAKE 1 CAPSULE BY MOUTH EVERY MORNING. TAKE 2 CAPSULES BY MOUTH AT BEDTIME 90 capsule 4  . hydrochlorothiazide (MICROZIDE) 12.5 MG capsule TAKE 1 CAPSULE BY MOUTH EVERY DAY 30 capsule 11  . latanoprost (XALATAN) 0.005 % ophthalmic solution Place 1 drop into  both eyes at bedtime.     . magnesium oxide (MAG-OX) 400 MG tablet Take 400 mg by mouth daily.    . Omega-3 Fatty Acids (FISH OIL BURP-LESS) 1000 MG CAPS Take 1,000 mg by mouth daily.     . potassium chloride (K-DUR) 10 MEQ tablet Take 1 tablet (10 mEq total) by mouth 2 (two) times daily as needed. 60 tablet 6  . vitamin C (ASCORBIC ACID) 500 MG tablet Take 1,000 mg by mouth daily.    . vitamin E 100 UNIT capsule Take 200 Units by mouth daily.     No current facility-administered medications for this visit.      Allergies:   Shellfish allergy; Albuterol; Azithromycin; and  Tamiflu [oseltamivir phosphate]   Social History:  The patient  reports that she has never smoked. She has never used smokeless tobacco. She reports that she does not drink alcohol or use drugs.   Family History:   family history includes CAD in her father and mother.    Review of Systems: Review of Systems  Constitutional: Negative.   Respiratory: Negative.   Cardiovascular: Negative.   Gastrointestinal: Negative.   Musculoskeletal: Negative.   Neurological: Negative.   Psychiatric/Behavioral: Negative.   All other systems reviewed and are negative.    PHYSICAL EXAM: VS:  BP (!) 126/50 (BP Location: Left Arm, Patient Position: Sitting, Cuff Size: Normal)   Pulse 71   Ht 5\' 5"  (1.651 m)   Wt 149 lb 4 oz (67.7 kg)   BMI 24.84 kg/m  , BMI Body mass index is 24.84 kg/m. GEN: Well nourished, well developed, in no acute distress  HEENT: normal  Neck: no JVD, carotid bruits, or masses Cardiac: RRR; no murmurs, rubs, or gallops,no edema  Respiratory:  clear to auscultation bilaterally, normal work of breathing GI: soft, nontender, nondistended, + BS MS: no deformity or atrophy  Skin: warm and dry, no rash Neuro:  Strength and sensation are intact Psych: euthymic mood, full affect    Recent Labs: 11/13/2016: ALT 31; BUN 18; Creatinine, Ser 1.01; Platelets 236; Potassium 4.3; Sodium 144; TSH 5.510    Lipid Panel No results found for: CHOL, HDL, LDLCALC, TRIG    Wt Readings from Last 3 Encounters:  11/27/16 149 lb 4 oz (67.7 kg)  11/11/16 149 lb (67.6 kg)  11/05/16 149 lb 6.4 oz (67.8 kg)       ASSESSMENT AND PLAN:  Paroxysmal atrial fibrillation (HCC) - Plan: EKG 12-Lead No medication changes, denies any symptoms concerning for arrhythmia  Essential hypertension Blood pressure is well controlled on today's visit. No changes made to the medications.  Encounter for anticoagulation discussion and counseling Tolerating anticoagulation, minimal  bruising  Centrilobular emphysema (HCC) Stable breathing, no further testing needed   Total encounter time more than 15 minutes  Greater than 50% was spent in counseling and coordination of care with the patient   Disposition:   F/U  12 months   Orders Placed This Encounter  Procedures  . EKG 12-Lead     Signed, Esmond Plants, M.D., Ph.D. 11/27/2016  [place], [place]

## 2016-12-17 ENCOUNTER — Other Ambulatory Visit: Payer: Self-pay | Admitting: Family Medicine

## 2016-12-17 NOTE — Progress Notes (Signed)
Please advise patient insurance no longer covers Tudorza, Have sent prescription for Spiriva to [facility] pharmacy to take in its place.

## 2016-12-18 NOTE — Progress Notes (Signed)
No answer. Will try again later. 

## 2016-12-23 MED ORDER — TIOTROPIUM BROMIDE MONOHYDRATE 18 MCG IN CAPS: 1.0000 | ORAL_CAPSULE | Freq: Every day | RESPIRATORY_TRACT | 12 refills | Status: DC

## 2016-12-23 NOTE — Progress Notes (Signed)
Patient was notified.

## 2017-02-02 ENCOUNTER — Other Ambulatory Visit: Payer: Self-pay | Admitting: Family Medicine

## 2017-02-24 DIAGNOSIS — L820 Inflamed seborrheic keratosis: Secondary | ICD-10-CM | POA: Diagnosis not present

## 2017-02-24 DIAGNOSIS — L570 Actinic keratosis: Secondary | ICD-10-CM | POA: Diagnosis not present

## 2017-03-11 IMAGING — CR DG CHEST 2V
1 series · 2 of 2 positions shown · non-contrast
Comparison: Chest x-ray of May 23, 2015

CLINICAL DATA: Two weeks of nonproductive cough, 2 days of fever
and chills, history of asthma, nonsmoker.

EXAM:
CHEST  2 VIEW

[Series 1: dg chest 2 view · 0.14mm/px · 2 of 2 slices shown]
[im 1/2]
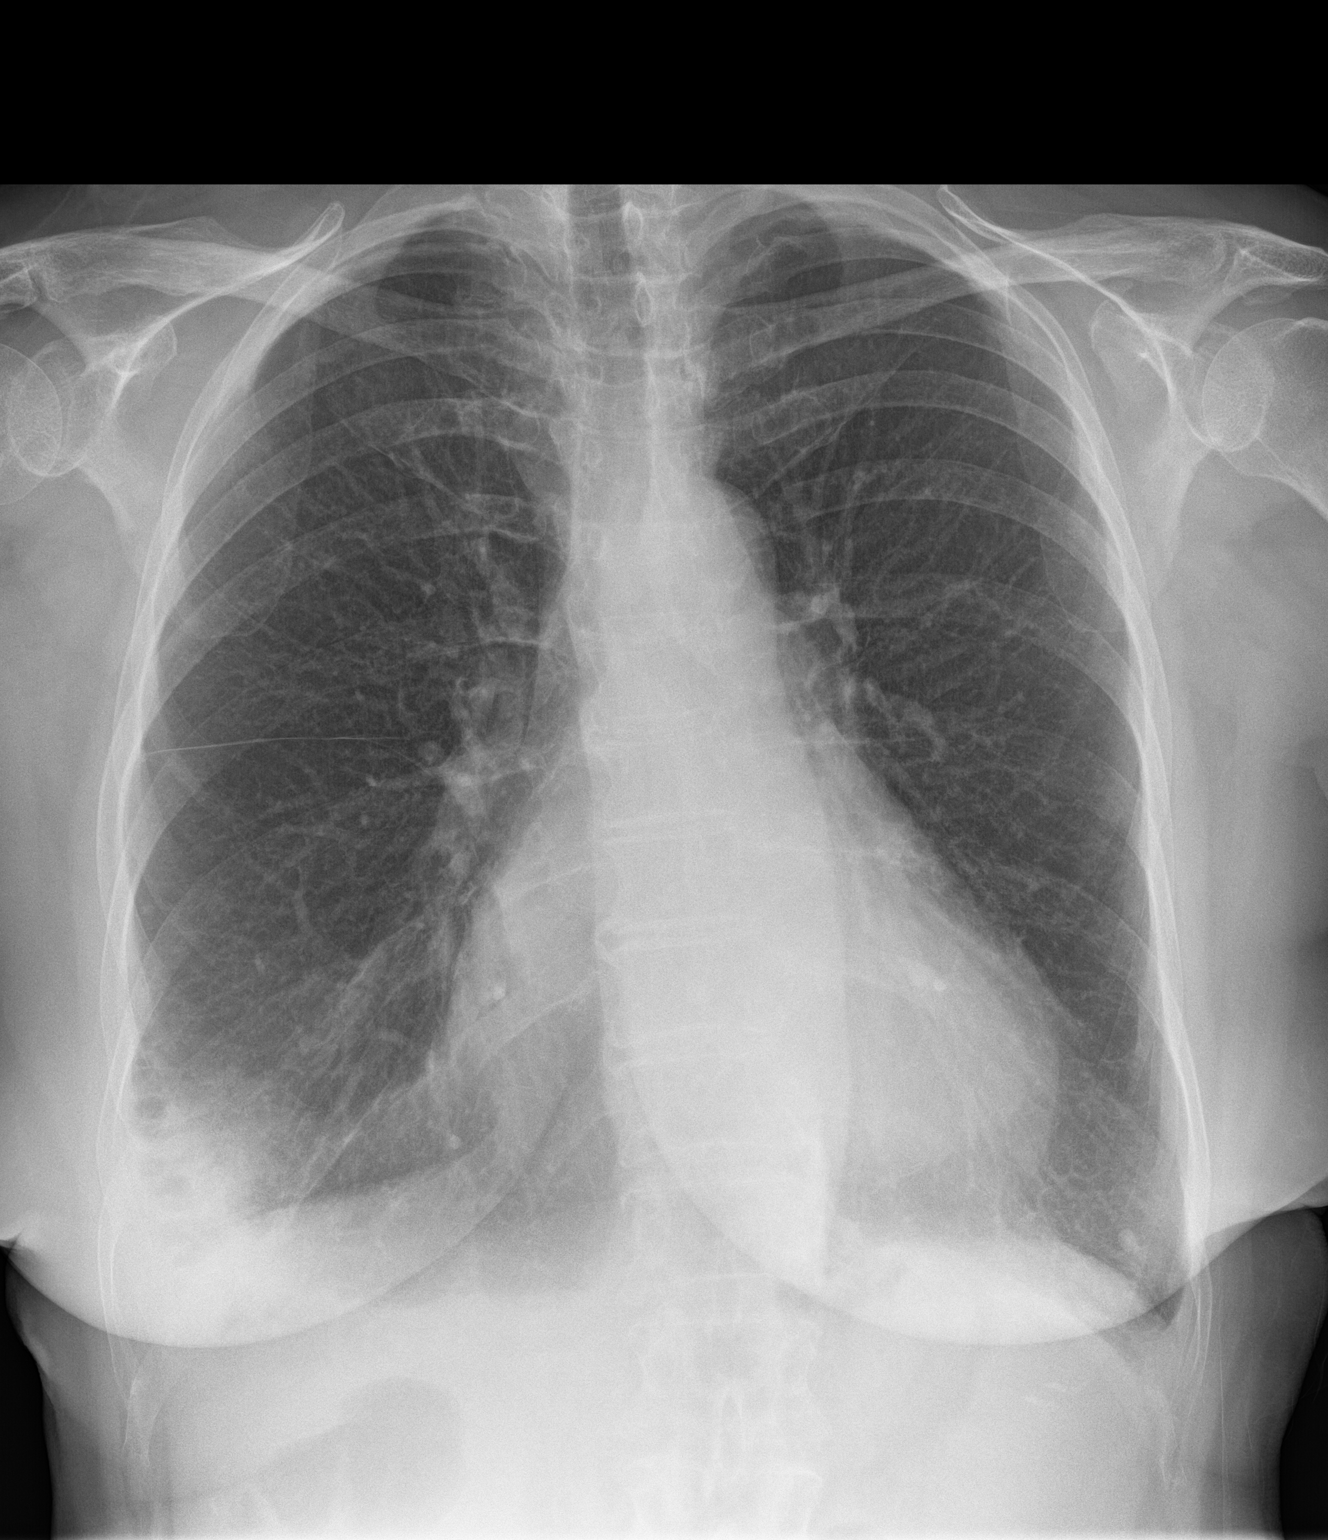
[im 2/2]
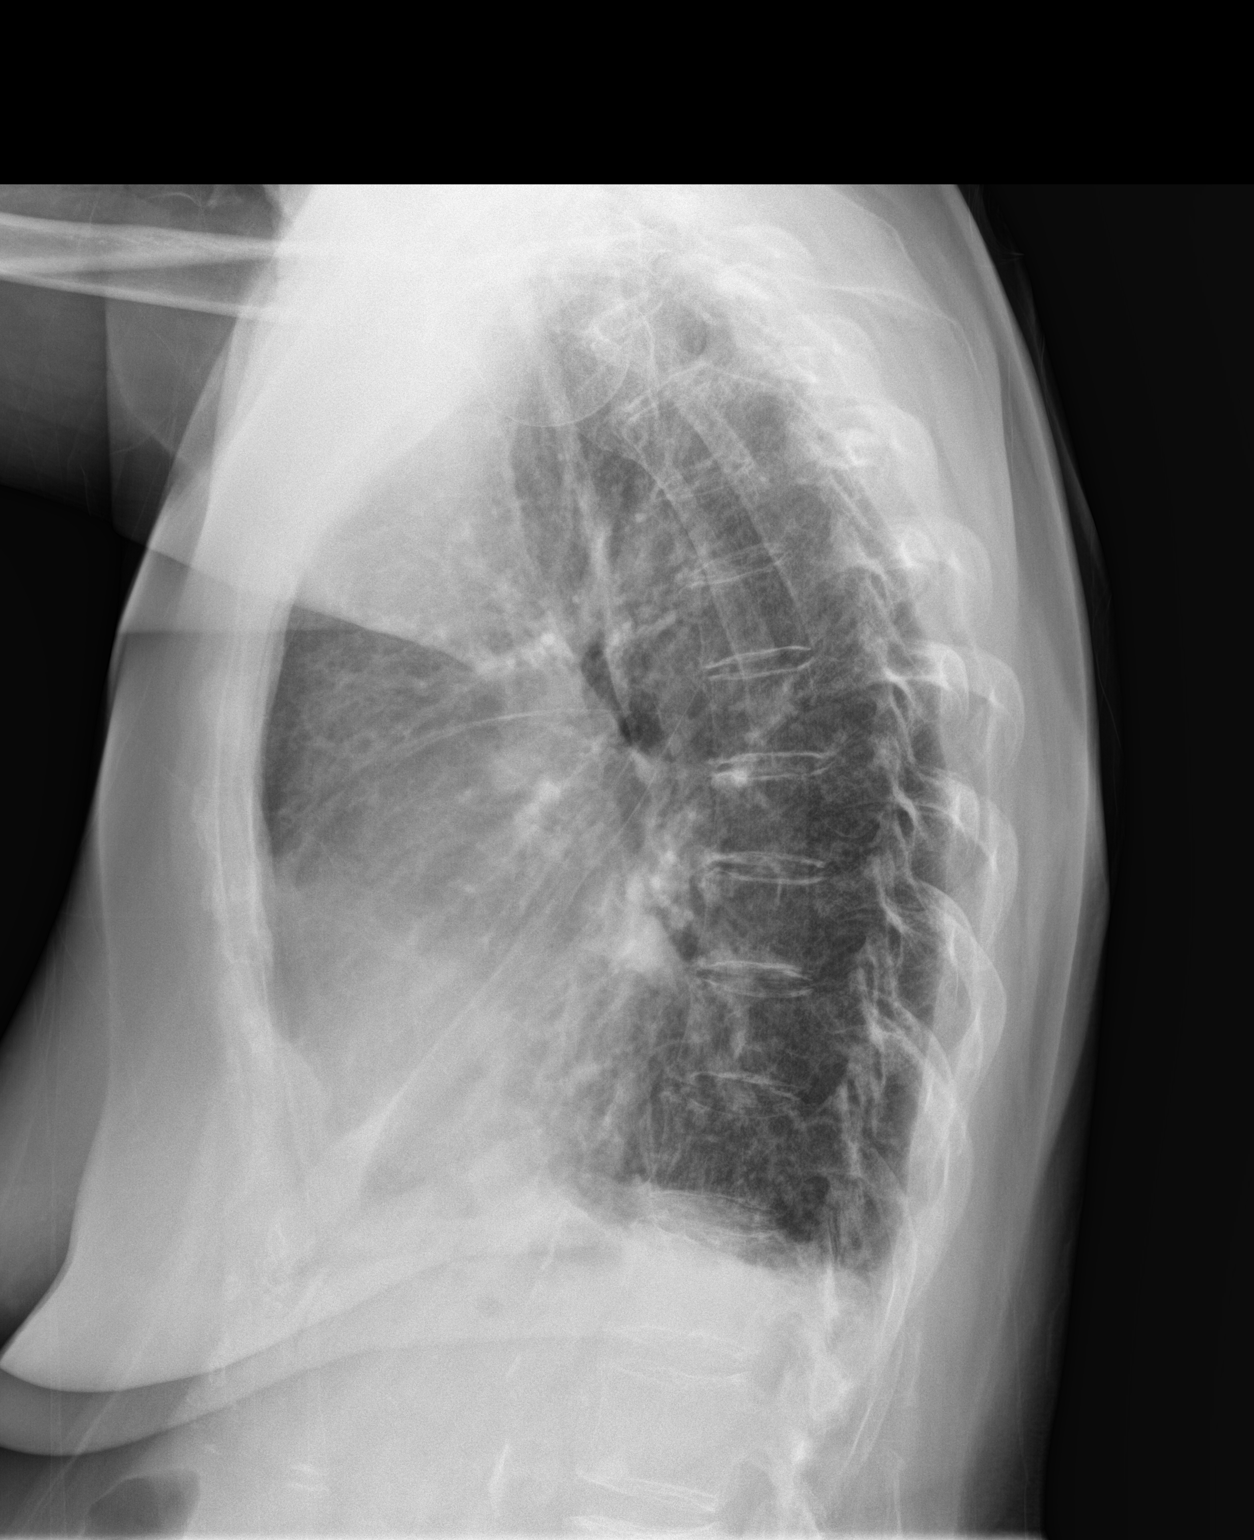

[2 of 2 positions shown; findings below may reference images not displayed]

FINDINGS: There is new infiltrate in the right lower lobe anteriorly. The left
lung is clear. There is a stable subcentimeter nodule in the left
lower lobe. The heart and pulmonary vascularity are normal. The
mediastinum is normal in width. The bony thorax exhibits no acute
abnormality.
IMPRESSION: New anterior right lower lobe pneumonia. Underlying reactive airway
disease. Followup PA and lateral chest X-ray is recommended in 3-4
weeks following trial of antibiotic therapy to ensure resolution and
exclude underlying malignancy.

## 2017-03-12 IMAGING — CR DG CHEST 2V
2 series · 2 of 2 positions shown · non-contrast
Comparison: CT of the chest 09/17/2015

CLINICAL DATA: Fever, chills and worsening right-sided chest pain.

EXAM:
CHEST  2 VIEW

[chest lat]
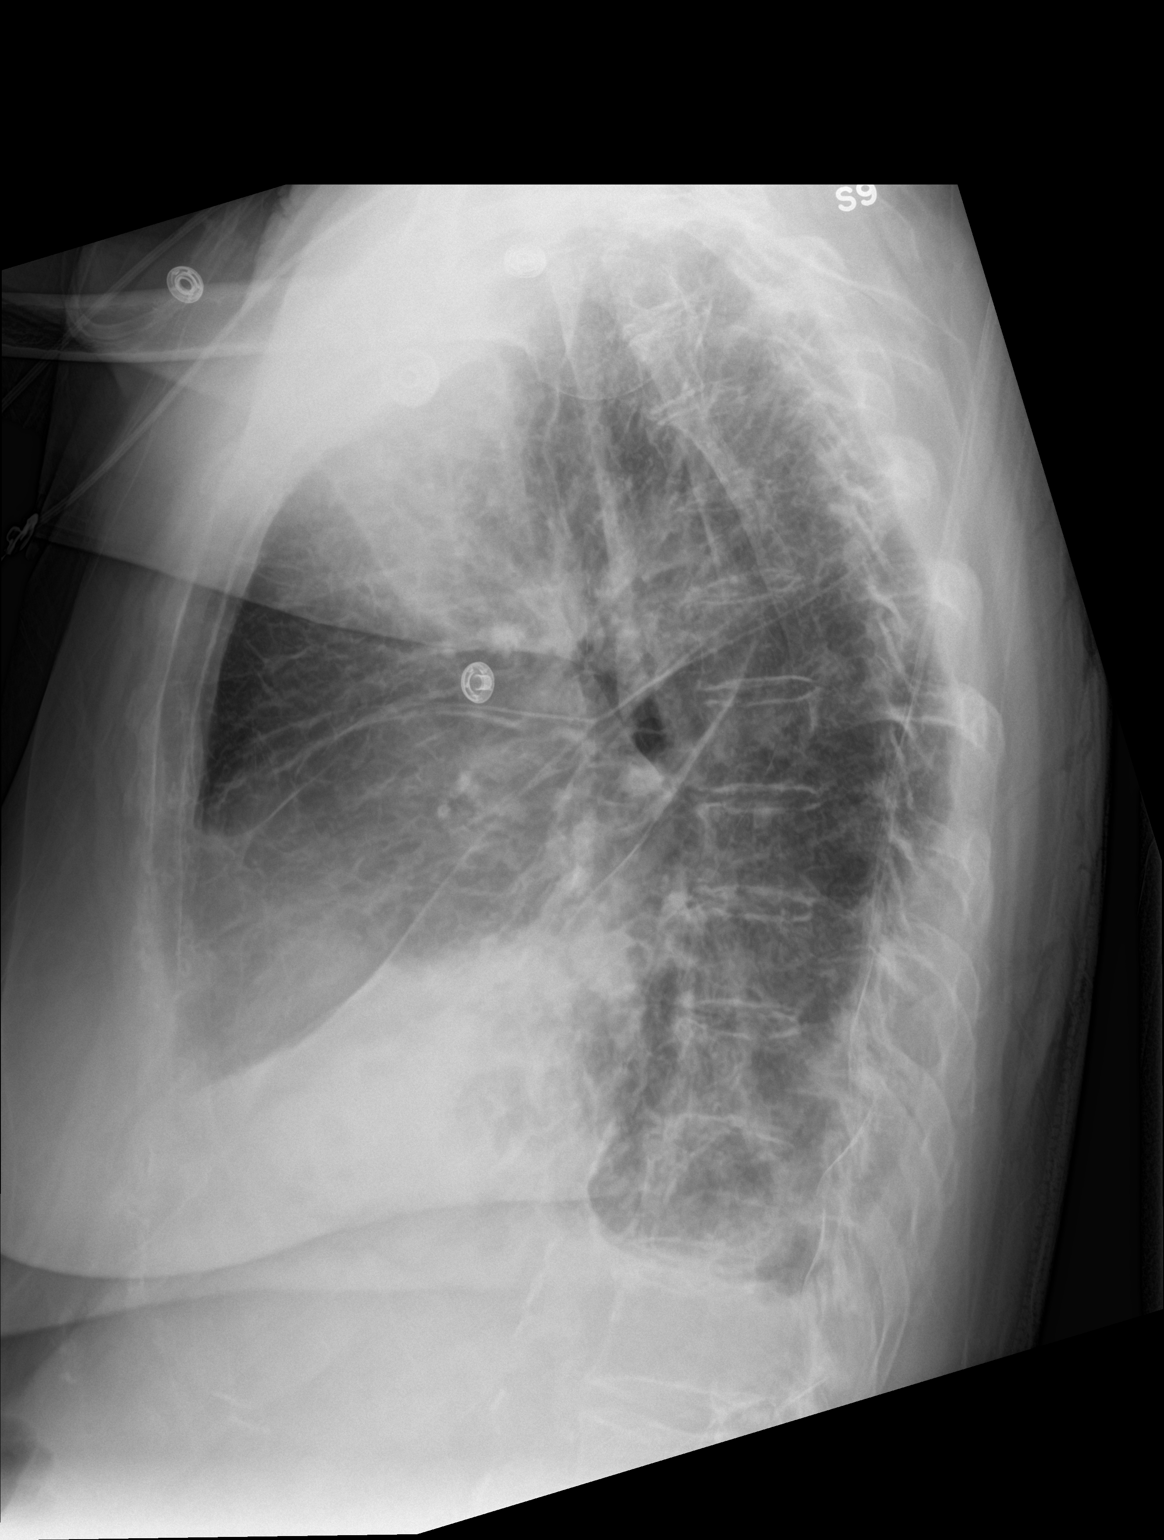

[chest ap]
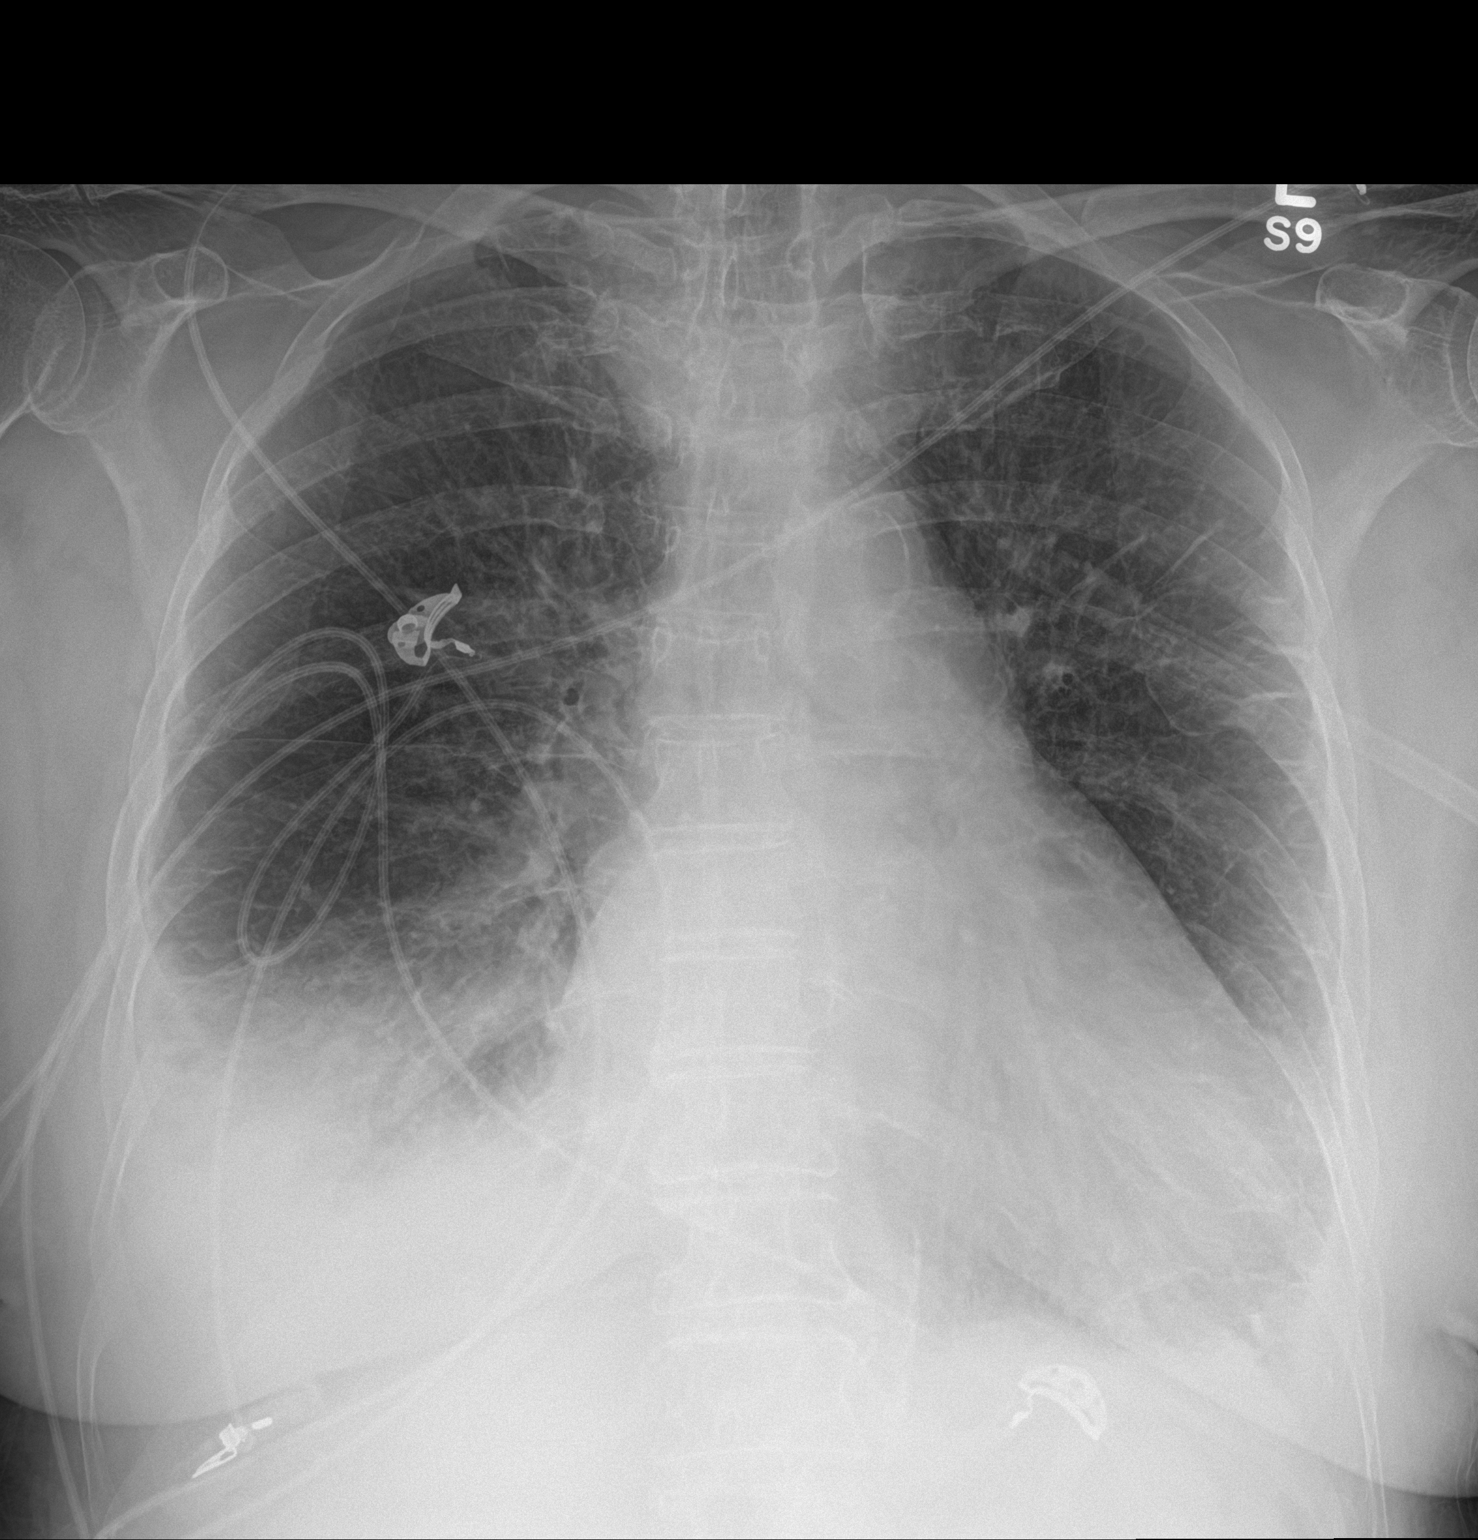

[2 of 2 positions shown; findings below may reference images not displayed]

FINDINGS: Cardiac silhouette is enlarged. Mediastinal contours appear intact.
Aorta is torturous and contains atherosclerotic calcifications.

There is no evidence of pneumothorax. Right lower lobe subpleural
airspace consolidation with small areas of cavitation projects as
ill-defined airspace opacity. There are bilateral pleural effusions,
right greater than left. Mild bronchiectasis is seen.

Osseous structures are without acute abnormality. Soft tissues are
grossly normal.
IMPRESSION: Persistent right lower lobe subpleural cavitary mass versus airspace
consolidation.

Bilateral pleural effusions.

There is worsening of the aeration of the lungs when compared to
09/17/2015. Its

Enlarged cardiac silhouette, stable.

## 2017-03-27 IMAGING — CR DG CHEST 2V
1 series · 2 of 2 positions shown · non-contrast
Comparison: Chest radiograph September 18, 2015; chest CT September 21, 2015

CLINICAL DATA: Productive cough

EXAM:
CHEST  2 VIEW

[Series 1: dg chest 2 view · 0.14mm/px · 2 of 2 slices shown]
[im 1/2]
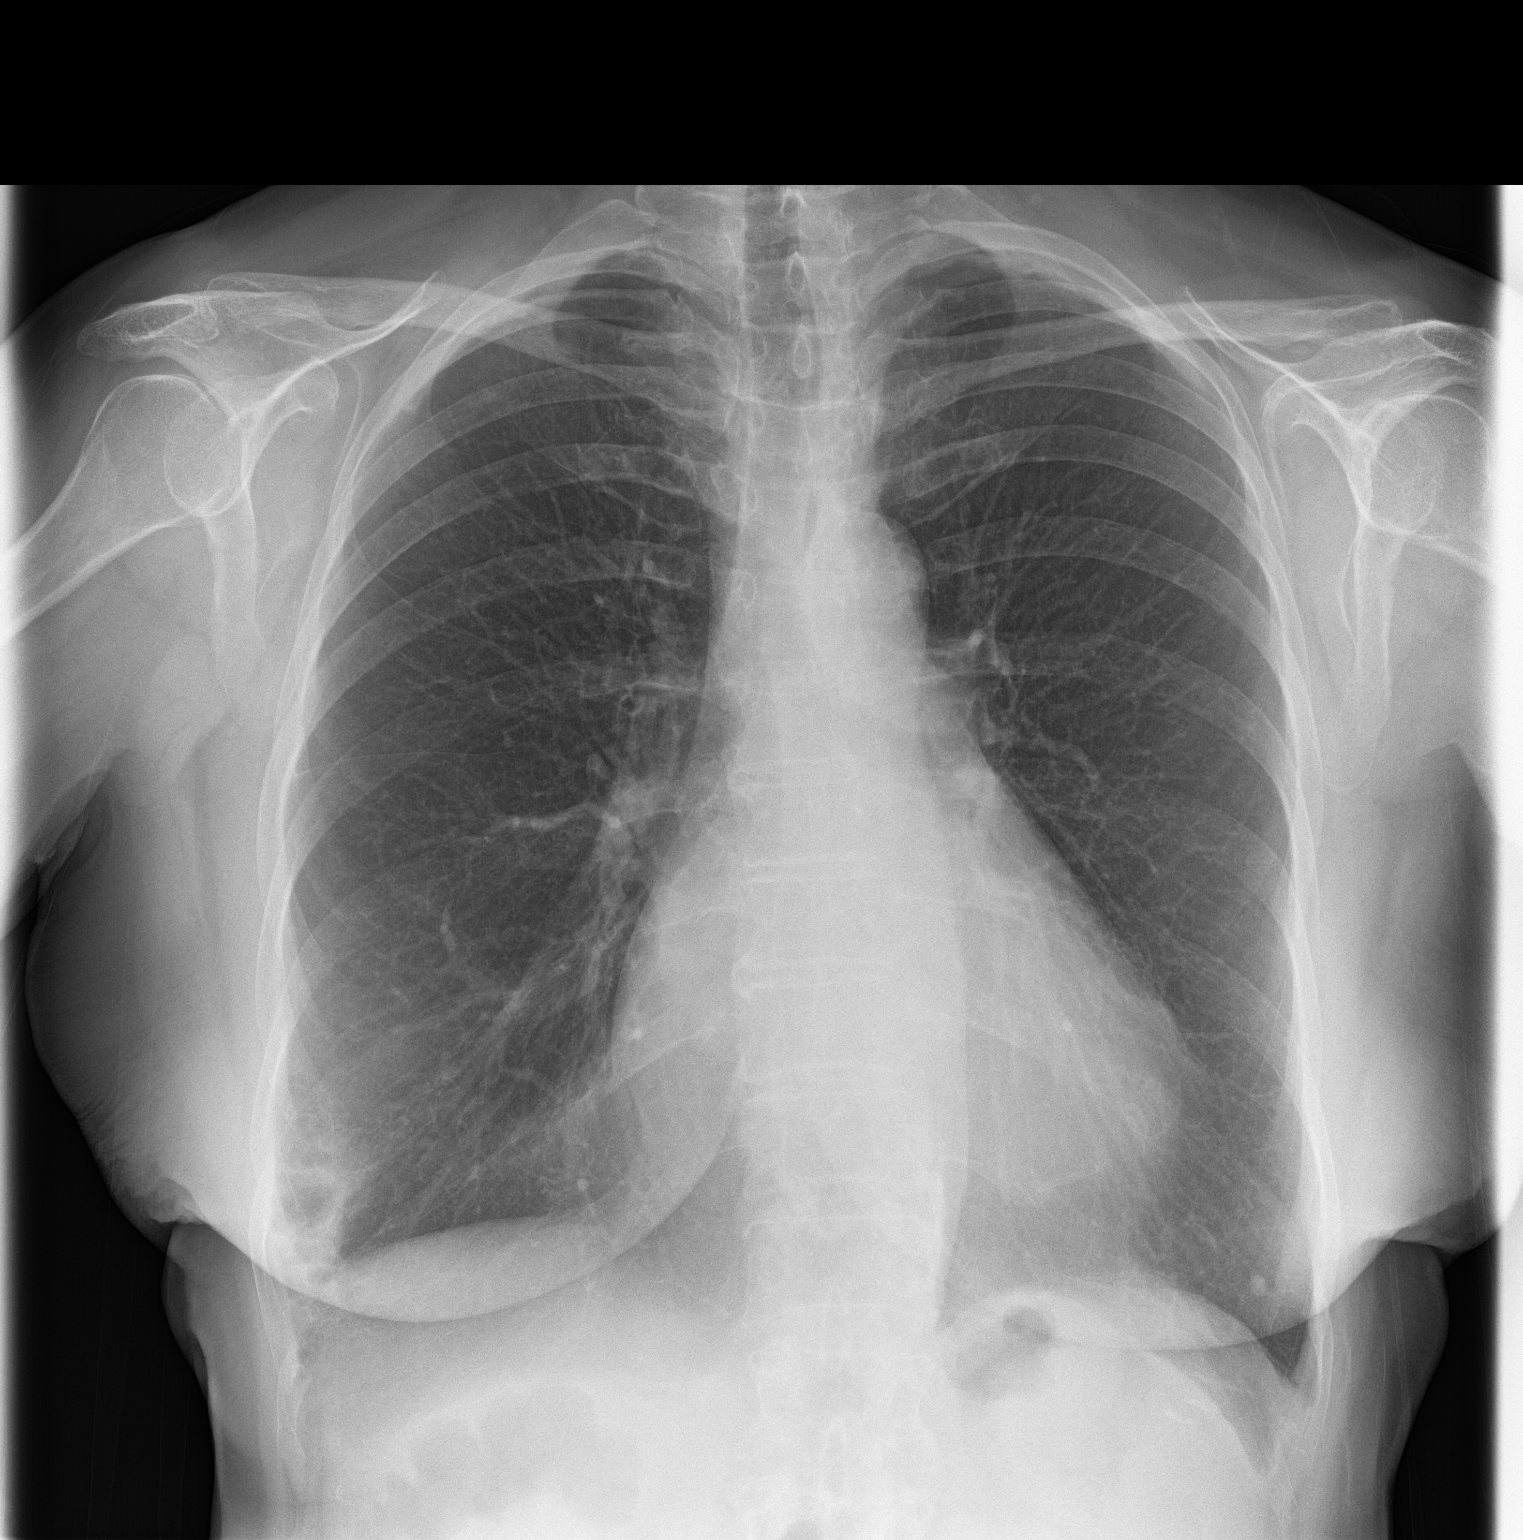
[im 2/2]
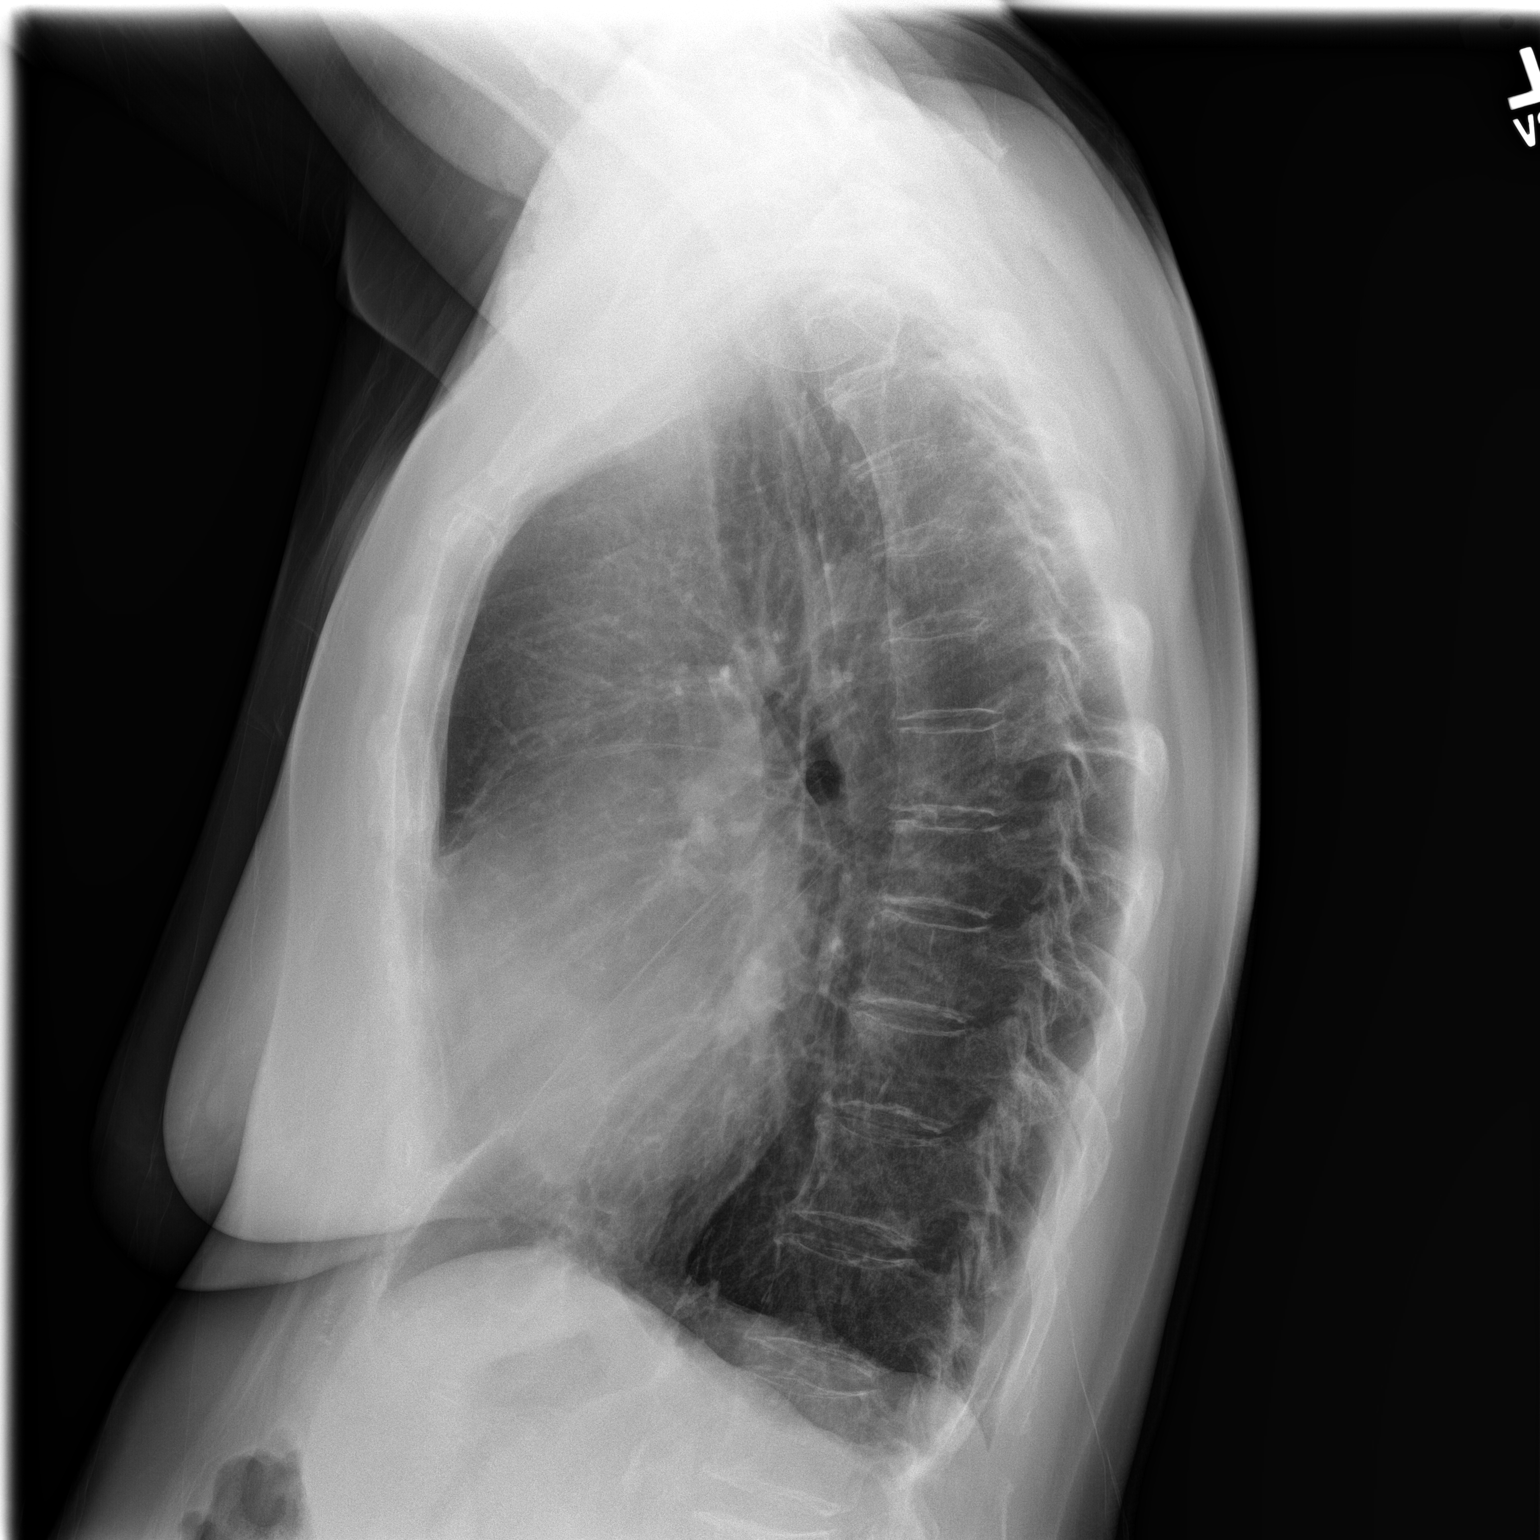

[2 of 2 positions shown; findings below may reference images not displayed]

FINDINGS: There is residual opacity in the periphery of the right base
laterally. There is a calcified granuloma in the left base.
Elsewhere lungs are clear. Heart size and pulmonary vascular normal.
No adenopathy. No bone lesions.
IMPRESSION: Residual opacity consistent with residual infiltrate/ pneumonitis in
the periphery the right lung base laterally. There has been
significant partial clearing in this area compared to recent prior
studies. Lungs elsewhere clear except for a small calcified
granuloma in the left base. Cardiac silhouette within normal limits.

## 2017-04-07 DIAGNOSIS — H401131 Primary open-angle glaucoma, bilateral, mild stage: Secondary | ICD-10-CM | POA: Diagnosis not present

## 2017-04-10 ENCOUNTER — Other Ambulatory Visit: Payer: Self-pay | Admitting: Family Medicine

## 2017-04-13 ENCOUNTER — Other Ambulatory Visit: Payer: Self-pay | Admitting: Family Medicine

## 2017-05-05 ENCOUNTER — Other Ambulatory Visit: Payer: Self-pay | Admitting: Cardiovascular Disease

## 2017-05-06 DIAGNOSIS — H401131 Primary open-angle glaucoma, bilateral, mild stage: Secondary | ICD-10-CM | POA: Diagnosis not present

## 2017-06-02 ENCOUNTER — Other Ambulatory Visit: Payer: Self-pay | Admitting: Family Medicine

## 2017-08-19 ENCOUNTER — Ambulatory Visit (INDEPENDENT_AMBULATORY_CARE_PROVIDER_SITE_OTHER): Payer: Medicare Other | Admitting: Family Medicine

## 2017-08-19 ENCOUNTER — Encounter: Payer: Self-pay | Admitting: Family Medicine

## 2017-08-19 VITALS — BP 144/60 | HR 86 | Temp 99.0°F | Resp 16 | Wt 147.0 lb

## 2017-08-19 DIAGNOSIS — J101 Influenza due to other identified influenza virus with other respiratory manifestations: Secondary | ICD-10-CM

## 2017-08-19 DIAGNOSIS — R509 Fever, unspecified: Secondary | ICD-10-CM

## 2017-08-19 LAB — POCT INFLUENZA A/B
Influenza A, POC: POSITIVE — AB
Influenza B, POC: NEGATIVE

## 2017-08-19 MED ORDER — DOXYCYCLINE HYCLATE 100 MG PO TABS: 100.0000 mg | ORAL_TABLET | Freq: Two times a day (BID) | ORAL | 0 refills | Status: DC

## 2017-08-19 NOTE — Progress Notes (Signed)
Patient: Molly Hodges Female    DOB: [birth year]   83 y.o.   MRN: [id number] Visit Date: 08/19/2017  Today's Provider: Lelon Huh, MD   Chief Complaint  Patient presents with  . URI   Subjective:    URI   This is a new problem. Episode onset: 2 days. The problem has been gradually worsening. The maximum temperature recorded prior to her arrival was 101 - 101.9 F. Associated symptoms include coughing and headaches. Pertinent negatives include no abdominal pain, chest pain, diarrhea, dysuria, nausea, plugged ear sensation, rhinorrhea, sinus pain, sneezing, sore throat or vomiting. She has tried acetaminophen for the symptoms. The treatment provided mild relief.       Allergies  Allergen Reactions  . Shellfish Allergy Anaphylaxis  . Albuterol     Heart racing.   . Azithromycin Other (See Comments)    Extreme burning sensation at IV site  . Tamiflu [Oseltamivir Phosphate] Other (See Comments)    Reaction:  Hallucinations  Reaction:  Hallucinations      Current Outpatient Medications:  .  ADVAIR DISKUS 250-50 MCG/DOSE AEPB, INHALE ONE (1) PUFF EVERY TWELVE HOURS; RINSE MOUTH AFTER USE, Disp: 60 each, Rfl: 12 .  amiodarone (PACERONE) 200 MG tablet, TAKE ONE TABLET BY MOUTH EVERY DAY AS DIRECTED, Disp: 90 tablet, Rfl: 3 .  Calcium Carbonate-Vitamin D (CALCIUM 600+D) 600-400 MG-UNIT tablet, Take 1 tablet by mouth daily., Disp: , Rfl:  .  cholecalciferol (VITAMIN D) 1000 UNITS tablet, Take 1,000 Units by mouth daily., Disp: , Rfl:  .  diltiazem (CARDIZEM CD) 180 MG 24 hr capsule, TAKE 1 CAPSULE BY MOUTH EVERY DAY, Disp: 30 capsule, Rfl: 5 .  dorzolamide (TRUSOPT) 2 % ophthalmic solution, Place 1 drop into both eyes 2 (two) times daily. , Disp: , Rfl:  .  ELIQUIS 5 MG TABS tablet, TAKE ONE TABLET BY MOUTH TWICE DAILY, Disp: 60 tablet, Rfl: 12 .  gabapentin (NEURONTIN) 100 MG capsule, Take 300 mg by mouth at bedtime. , Disp: , Rfl:  .  gabapentin (NEURONTIN) 300 MG capsule,  TAKE 1 CAPSULE EVERY MORNING AND 2 CAPSULES AT BEDTIME, Disp: 90 capsule, Rfl: 4 .  hydrochlorothiazide (MICROZIDE) 12.5 MG capsule, TAKE 1 CAPSULE BY MOUTH EVERY DAY, Disp: 30 capsule, Rfl: 12 .  latanoprost (XALATAN) 0.005 % ophthalmic solution, Place 1 drop into both eyes at bedtime. , Disp: , Rfl:  .  magnesium oxide (MAG-OX) 400 MG tablet, Take 400 mg by mouth daily., Disp: , Rfl:  .  Omega-3 Fatty Acids (FISH OIL BURP-LESS) 1000 MG CAPS, Take 1,000 mg by mouth daily. , Disp: , Rfl:  .  potassium chloride (K-DUR) 10 MEQ tablet, Take 1 tablet (10 mEq total) by mouth 2 (two) times daily as needed., Disp: 60 tablet, Rfl: 6 .  tiotropium (SPIRIVA HANDIHALER) 18 MCG inhalation capsule, Place 1 capsule (18 mcg total) into inhaler and inhale daily., Disp: 30 capsule, Rfl: 12 .  vitamin C (ASCORBIC ACID) 500 MG tablet, Take 1,000 mg by mouth daily., Disp: , Rfl:  .  vitamin E 100 UNIT capsule, Take 200 Units by mouth daily., Disp: , Rfl:  .  furosemide (LASIX) 20 MG tablet, Take 1 tablet (20 mg total) by mouth daily as needed., Disp: 30 tablet, Rfl: 6  Review of Systems  Constitutional: Positive for chills, diaphoresis, fatigue and fever. Negative for appetite change.  HENT: Negative for rhinorrhea, sinus pain, sneezing and sore throat.   Respiratory: Positive for cough.  Negative for chest tightness and shortness of breath.   Cardiovascular: Negative for chest pain and palpitations.  Gastrointestinal: Negative for abdominal pain, diarrhea, nausea and vomiting.  Genitourinary: Negative for dysuria.  Musculoskeletal: Positive for myalgias.  Neurological: Positive for headaches. Negative for dizziness and weakness.    Social History   Tobacco Use  . Smoking status: Never Smoker  . Smokeless tobacco: Never Used  Substance Use Topics  . Alcohol use: No   Objective:   BP (!) 144/60 (BP Location: Left Arm, Patient Position: Sitting, Cuff Size: Large)   Pulse 86   Temp 99 F (37.2 C) (Oral)    Resp 16   Wt 147 lb (66.7 kg)   SpO2 98% Comment: room air  BMI 24.46 kg/m  Vitals:   08/19/17 1055  BP: (!) 144/60  Pulse: 86  Resp: 16  Temp: 99 F (37.2 C)  TempSrc: Oral  SpO2: 98%  Weight: 147 lb (66.7 kg)     Physical Exam  General Appearance:    Alert, cooperative, no distress  HENT:   ENT exam normal, no neck nodes or sinus tenderness  Eyes:    PERRL, conjunctiva/corneas clear, EOM's intact       Lungs:     Clear to auscultation bilaterally, respirations unlabored  Heart:    Regular rate and rhythm  Neurologic:   Awake, alert, oriented x 3. No apparent focal neurological           defect.       Results for orders placed or performed in visit on 08/19/17  POCT Influenza A/B  Result Value Ref Range   Influenza A, POC Positive (A) Negative   Influenza B, POC Negative Negative       Assessment & Plan:     1. Influenza A She cannot take Tamflu due to history of adverse reaction, cannot take Relenza due having COPD, and amantadine is not recommended for influenza A this season due to widespread resistance and potential of adverse reactions. Counseled on supportive care and to call if she has any new or worsening symptoms. Will start doxycycline to cover for potential secondary bacterial respiratory infections.  - doxycycline (VIBRA-TABS) 100 MG tablet; Take 1 tablet (100 mg total) by mouth 2 (two) times daily.  Dispense: 20 tablet; Refill: 0  2. Fever, unspecified fever cause  - POCT Influenza A/B       Lelon Huh, MD  [facility]

## 2017-08-19 NOTE — Patient Instructions (Signed)

## 2017-08-28 ENCOUNTER — Ambulatory Visit (INDEPENDENT_AMBULATORY_CARE_PROVIDER_SITE_OTHER): Payer: Medicare Other | Admitting: Family Medicine

## 2017-08-28 DIAGNOSIS — Z23 Encounter for immunization: Secondary | ICD-10-CM

## 2017-10-01 ENCOUNTER — Other Ambulatory Visit: Payer: Self-pay | Admitting: Family Medicine

## 2017-10-19 ENCOUNTER — Ambulatory Visit (INDEPENDENT_AMBULATORY_CARE_PROVIDER_SITE_OTHER): Payer: Medicare Other

## 2017-10-19 VITALS — BP 164/60 | HR 72 | Temp 98.5°F | Ht 65.0 in | Wt 147.4 lb

## 2017-10-19 DIAGNOSIS — Z0000 Encounter for general adult medical examination without abnormal findings: Secondary | ICD-10-CM

## 2017-10-19 NOTE — Patient Instructions (Signed)
Molly Hodges , Thank you for taking time to come for your Medicare Wellness Visit. I appreciate your ongoing commitment to your health goals. Please review the following plan we discussed and let me know if I can assist you in the future.   Screening recommendations/referrals: Colonoscopy: Up to date Mammogram: Up to date Bone Density: Up to date Recommended yearly ophthalmology/optometry visit for glaucoma screening and checkup Recommended yearly dental visit for hygiene and checkup  Vaccinations: Influenza vaccine: Up to date Pneumococcal vaccine: Up to date Tdap vaccine: Pt declines today.  Shingles vaccine: Pt declines today.     Advanced directives: Already on file.  Conditions/risks identified: Recommend drinking 6 glasses of water a day.   Next appointment: 10/27/17 @ 9:00 AM with Dr Caryn Section.   Preventive Care 25 Years and Older, Female Preventive care refers to lifestyle choices and visits with your health care provider that can promote health and wellness. What does preventive care include?  A yearly physical exam. This is also called an annual well check.  Dental exams once or twice a year.  Routine eye exams. Ask your health care provider how often you should have your eyes checked.  Personal lifestyle choices, including:  Daily care of your teeth and gums.  Regular physical activity.  Eating a healthy diet.  Avoiding tobacco and drug use.  Limiting alcohol use.  Practicing safe sex.  Taking low-dose aspirin every day.  Taking vitamin and mineral supplements as recommended by your health care provider. What happens during an annual well check? The services and screenings done by your health care provider during your annual well check will depend on your age, overall health, lifestyle risk factors, and family history of disease. Counseling  Your health care provider may ask you questions about your:  Alcohol use.  Tobacco use.  Drug use.  Emotional  well-being.  Home and relationship well-being.  Sexual activity.  Eating habits.  History of falls.  Memory and ability to understand (cognition).  Work and work Statistician.  Reproductive health. Screening  You may have the following tests or measurements:  Height, weight, and BMI.  Blood pressure.  Lipid and cholesterol levels. These may be checked every 5 years, or more frequently if you are over 51 years old.  Skin check.  Lung cancer screening. You may have this screening every year starting at age 37 if you have a 30-pack-year history of smoking and currently smoke or have quit within the past 15 years.  Fecal occult blood test (FOBT) of the stool. You may have this test every year starting at age 60.  Flexible sigmoidoscopy or colonoscopy. You may have a sigmoidoscopy every 5 years or a colonoscopy every 10 years starting at age 88.  Hepatitis C blood test.  Hepatitis B blood test.  Sexually transmitted disease (STD) testing.  Diabetes screening. This is done by checking your blood sugar (glucose) after you have not eaten for a while (fasting). You may have this done every 1-3 years.  Bone density scan. This is done to screen for osteoporosis. You may have this done starting at age 87.  Mammogram. This may be done every 1-2 years. Talk to your health care provider about how often you should have regular mammograms. Talk with your health care provider about your test results, treatment options, and if necessary, the need for more tests. Vaccines  Your health care provider may recommend certain vaccines, such as:  Influenza vaccine. This is recommended every year.  Tetanus, diphtheria,  and acellular pertussis (Tdap, Td) vaccine. You may need a Td booster every 10 years.  Zoster vaccine. You may need this after age 80.  Pneumococcal 13-valent conjugate (PCV13) vaccine. One dose is recommended after age 51.  Pneumococcal polysaccharide (PPSV23) vaccine. One  dose is recommended after age 72. Talk to your health care provider about which screenings and vaccines you need and how often you need them. This information is not intended to replace advice given to you by your health care provider. Make sure you discuss any questions you have with your health care provider. Document Released: 08/31/2015 Document Revised: 04/23/2016 Document Reviewed: 06/05/2015 Elsevier Interactive Patient Education  2017 [place] Prevention in the Home Falls can cause injuries. They can happen to people of all ages. There are many things you can do to make your home safe and to help prevent falls. What can I do on the outside of my home?  Regularly fix the edges of walkways and driveways and fix any cracks.  Remove anything that might make you trip as you walk through a door, such as a raised step or threshold.  Trim any bushes or trees on the path to your home.  Use bright outdoor lighting.  Clear any walking paths of anything that might make someone trip, such as rocks or tools.  Regularly check to see if handrails are loose or broken. Make sure that both sides of any steps have handrails.  Any raised decks and porches should have guardrails on the edges.  Have any leaves, snow, or ice cleared regularly.  Use sand or salt on walking paths during winter.  Clean up any spills in your garage right away. This includes oil or grease spills. What can I do in the bathroom?  Use night lights.  Install grab bars by the toilet and in the tub and shower. Do not use towel bars as grab bars.  Use non-skid mats or decals in the tub or shower.  If you need to sit down in the shower, use a plastic, non-slip stool.  Keep the floor dry. Clean up any water that spills on the floor as soon as it happens.  Remove soap buildup in the tub or shower regularly.  Attach bath mats securely with double-sided non-slip rug tape.  Do not have throw rugs and other  things on the floor that can make you trip. What can I do in the bedroom?  Use night lights.  Make sure that you have a light by your bed that is easy to reach.  Do not use any sheets or blankets that are too big for your bed. They should not hang down onto the floor.  Have a firm chair that has side arms. You can use this for support while you get dressed.  Do not have throw rugs and other things on the floor that can make you trip. What can I do in the kitchen?  Clean up any spills right away.  Avoid walking on wet floors.  Keep items that you use a lot in easy-to-reach places.  If you need to reach something above you, use a strong step stool that has a grab bar.  Keep electrical cords out of the way.  Do not use floor polish or wax that makes floors slippery. If you must use wax, use non-skid floor wax.  Do not have throw rugs and other things on the floor that can make you trip. What can I do with my  stairs?  Do not leave any items on the stairs.  Make sure that there are handrails on both sides of the stairs and use them. Fix handrails that are broken or loose. Make sure that handrails are as long as the stairways.  Check any carpeting to make sure that it is firmly attached to the stairs. Fix any carpet that is loose or worn.  Avoid having throw rugs at the top or bottom of the stairs. If you do have throw rugs, attach them to the floor with carpet tape.  Make sure that you have a light switch at the top of the stairs and the bottom of the stairs. If you do not have them, ask someone to add them for you. What else can I do to help prevent falls?  Wear shoes that:  Do not have high heels.  Have rubber bottoms.  Are comfortable and fit you well.  Are closed at the toe. Do not wear sandals.  If you use a stepladder:  Make sure that it is fully opened. Do not climb a closed stepladder.  Make sure that both sides of the stepladder are locked into place.  Ask  someone to hold it for you, if possible.  Clearly mark and make sure that you can see:  Any grab bars or handrails.  First and last steps.  Where the edge of each step is.  Use tools that help you move around (mobility aids) if they are needed. These include:  Canes.  Walkers.  Scooters.  Crutches.  Turn on the lights when you go into a dark area. Replace any light bulbs as soon as they burn out.  Set up your furniture so you have a clear path. Avoid moving your furniture around.  If any of your floors are uneven, fix them.  If there are any pets around you, be aware of where they are.  Review your medicines with your doctor. Some medicines can make you feel dizzy. This can increase your chance of falling. Ask your doctor what other things that you can do to help prevent falls. This information is not intended to replace advice given to you by your health care provider. Make sure you discuss any questions you have with your health care provider. Document Released: 05/31/2009 Document Revised: 01/10/2016 Document Reviewed: 09/08/2014 Elsevier Interactive Patient Education  2017 [employer].

## 2017-10-19 NOTE — Progress Notes (Signed)
Subjective:   MARRIANA Hodges is a 84 y.o. female who presents for Medicare Annual (Subsequent) preventive examination.  Review of Systems:  N/A Cardiac Risk Factors include: advanced age (>46men, >7 women);hypertension     Objective:     Vitals: BP (!) 164/60 (BP Location: Left Arm)   Pulse 72   Temp 98.5 F (36.9 C) (Oral)   Ht 5\' 5"  (1.651 m)   Wt 147 lb 6.4 oz (66.9 kg)   BMI 24.53 kg/m   Body mass index is 24.53 kg/m.  Advanced Directives 10/19/2017 11/05/2016 11/15/2015 09/17/2015 03/26/2015  Does Patient Have a Medical Advance Directive? Yes Yes Yes No Yes  Type of Paramedic of [place];Living will [place];Living will - - Living will;Healthcare Power of [place] in Chart? Yes No - copy requested No - copy requested - No - copy requested  Would patient like information on creating a medical advance directive? - - - No - patient declined information -    Tobacco Social History   Tobacco Use  Smoking Status Never Smoker  Smokeless Tobacco Never Used     Counseling given: Not Answered   Clinical Intake:  Pre-visit preparation completed: Yes  Pain : No/denies pain Pain Score: 0-No pain     Nutritional Status: BMI of 19-24  Normal Nutritional Risks: None Diabetes: No  How often do you need to have someone help you when you read instructions, pamphlets, or other written materials from your doctor or pharmacy?: 1 - Never  Interpreter Needed?: No  Information entered by :: [facility], LPN  Past Medical History:  Diagnosis Date  . Community acquired pneumonia   . Glaucoma   . New onset atrial fibrillation ([place]) 09/17/2015   a. On Eliquis  . Sepsis ([place]) 09/17/2015   Past Surgical History:  Procedure Laterality Date  . ABDOMINAL HYSTERECTOMY  1987   due to heavy bleeding  . APPENDECTOMY    . CATARACT EXTRACTION    . ELECTROPHYSIOLOGIC STUDY N/A 11/15/2015   Procedure:  CARDIOVERSION;  Surgeon: Minna Merritts, MD;  Location: [facility] ORS;  Service: Cardiovascular;  Laterality: N/A;  . TONSILLECTOMY     Family History  Problem Relation Age of Onset  . CAD Mother   . CAD Father   . Cancer Son 15       lung cancer   Social History   Socioeconomic History  . Marital status: Married    Spouse name: None  . Number of children: 4  . Years of education: None  . Highest education level: 12th grade  Social Needs  . Financial resource strain: Not hard at all  . Food insecurity - worry: Never true  . Food insecurity - inability: Never true  . Transportation needs - medical: No  . Transportation needs - non-medical: No  Occupational History  . Occupation: retired  Tobacco Use  . Smoking status: Never Smoker  . Smokeless tobacco: Never Used  Substance and Sexual Activity  . Alcohol use: No  . Drug use: No  . Sexual activity: None  Other Topics Concern  . None  Social History Narrative   Lives at home with Husband. Active and Independent at baseline.    Outpatient Encounter Medications as of 10/19/2017  Medication Sig  . ADVAIR DISKUS 250-50 MCG/DOSE AEPB INHALE ONE (1) PUFF EVERY TWELVE HOURS; RINSE MOUTH AFTER USE  . amiodarone (PACERONE) 200 MG tablet TAKE ONE TABLET BY MOUTH EVERY  DAY AS DIRECTED  . Calcium Carbonate-Vitamin D (CALCIUM 600+D) 600-400 MG-UNIT tablet Take 1 tablet by mouth daily.  . cholecalciferol (VITAMIN D) 1000 UNITS tablet Take 1,000 Units by mouth daily.  Marland Kitchen diltiazem (CARDIZEM CD) 180 MG 24 hr capsule TAKE 1 CAPSULE EVERY DAY  . dorzolamide (TRUSOPT) 2 % ophthalmic solution Place 1 drop into both eyes 2 (two) times daily.   Marland Kitchen doxycycline (VIBRA-TABS) 100 MG tablet Take 1 tablet (100 mg total) by mouth 2 (two) times daily.  Marland Kitchen ELIQUIS 5 MG TABS tablet TAKE ONE TABLET BY MOUTH TWICE DAILY  . furosemide (LASIX) 20 MG tablet Take 1 tablet (20 mg total) by mouth daily as needed.  . gabapentin (NEURONTIN) 300 MG capsule TAKE 1  CAPSULE EVERY MORNING AND 2 CAPSULES AT BEDTIME  . hydrochlorothiazide (MICROZIDE) 12.5 MG capsule TAKE 1 CAPSULE BY MOUTH EVERY DAY  . latanoprost (XALATAN) 0.005 % ophthalmic solution Place 1 drop into both eyes at bedtime.   . magnesium oxide (MAG-OX) 400 MG tablet Take 400 mg by mouth daily.  . Omega-3 Fatty Acids (FISH OIL BURP-LESS) 1000 MG CAPS Take 1,000 mg by mouth daily.   . potassium chloride (K-DUR) 10 MEQ tablet Take 1 tablet (10 mEq total) by mouth 2 (two) times daily as needed.  . vitamin C (ASCORBIC ACID) 500 MG tablet Take 1,000 mg by mouth daily.  . vitamin E 100 UNIT capsule Take 200 Units by mouth daily.  . [DISCONTINUED] gabapentin (NEURONTIN) 100 MG capsule Take 300 mg by mouth at bedtime.   Marland Kitchen tiotropium (SPIRIVA HANDIHALER) 18 MCG inhalation capsule Place 1 capsule (18 mcg total) into inhaler and inhale daily. (Patient not taking: Reported on 10/19/2017)   No facility-administered encounter medications on file as of 10/19/2017.     Activities of Daily Living In your present state of health, do you have any difficulty performing the following activities: 10/19/2017 11/05/2016  Hearing? N N  Vision? Y N  Comment Due to trouble with peripheral vision. -  Difficulty concentrating or making decisions? N N  Walking or climbing stairs? N N  Dressing or bathing? N N  Doing errands, shopping? Y N  Comment Does not drive due to peripheral vision issues.  -  Preparing Food and eating ? N N  Using the Toilet? N N  In the past six months, have you accidently leaked urine? N N  Do you have problems with loss of bowel control? N N  Managing your Medications? N N  Managing your Finances? N N  Housekeeping or managing your Housekeeping? N N  Some recent data might be hidden    Patient Care Team: Birdie Sons, MD as PCP - General (Family Medicine) Minna Merritts, MD as Consulting Physician (Cardiology) Leandrew Koyanagi, MD as Referring Physician (Ophthalmology)      Assessment:   This is a routine wellness examination for [place].  Exercise Activities and Dietary recommendations Current Exercise Habits: The patient does not participate in regular exercise at present, Exercise limited by: None identified  Goals    . DIET - INCREASE WATER INTAKE     Recommend drinking 6 glasses of water a day.        Fall Risk Fall Risk  10/19/2017 11/05/2016  Falls in the past year? No No   Is the patient's home free of loose throw rugs in walkways, pet beds, electrical cords, etc?   yes      Grab bars in the bathroom? yes  Handrails on the stairs?   yes      Adequate lighting?   yes  Timed Get Up and Go performed: N/A  Depression Screen PHQ 2/9 Scores 10/19/2017 11/05/2016 11/05/2016  PHQ - 2 Score 0 0 0  PHQ- 9 Score - 0 -     Cognitive Function: Pt declined screening today due to completing the MMS with home health nurse on 10/15/17.     6CIT Screen 11/05/2016  What Year? 0 points  What month? 0 points  What time? 0 points  Count back from 20 0 points  Months in reverse 4 points  Repeat phrase 0 points  Total Score 4    Immunization History  Administered Date(s) Administered  . Influenza, High Dose Seasonal PF 08/28/2017  . Pneumococcal Conjugate-13 05/30/2014  . Pneumococcal Polysaccharide-23 06/09/1985, 06/15/2006    Qualifies for Shingles Vaccine? Due for Shingles vaccine. Declined my offer to administer today. Education has been provided regarding the importance of this vaccine. Pt has been advised to call her insurance company to determine her out of pocket expense. Advised she may also receive this vaccine at her local pharmacy or Health Dept. Verbalized acceptance and understanding.  Screening Tests Health Maintenance  Topic Date Due  . TETANUS/TDAP  08/18/2026 (Originally 10/20/1950)  . INFLUENZA VACCINE  Completed  . DEXA SCAN  Completed  . PNA vac Low Risk Adult  Completed    Cancer Screenings: Lung: Low Dose CT Chest  recommended if Age 62-80 years, 30 pack-year currently smoking OR have quit w/in 15years. Patient does not qualify. Breast:  Up to date on Mammogram? Yes   Up to date of Bone Density/Dexa? Yes Colorectal: Up to date  Additional Screenings:  Hepatitis C Screening: N/A     Plan:  I have personally reviewed and addressed the Medicare Annual Wellness questionnaire and have noted the following in the patient's chart:  A. Medical and social history B. Use of alcohol, tobacco or illicit drugs  C. Current medications and supplements D. Functional ability and status E.  Nutritional status F.  Physical activity G. Advance directives H. List of other physicians I.  Hospitalizations, surgeries, and ER visits in previous 12 months J.  [place] such as hearing and vision if needed, cognitive and depression L. Referrals and appointments - none  In addition, I have reviewed and discussed with patient certain preventive protocols, quality metrics, and best practice recommendations. A written personalized care plan for preventive services as well as general preventive health recommendations were provided to patient.  See attached scanned questionnaire for additional information.   Signed,  Fabio Neighbors, LPN Nurse Health Advisor   Nurse Recommendations: Pt declined the tetanus vaccine today.

## 2017-10-26 ENCOUNTER — Other Ambulatory Visit: Payer: Self-pay | Admitting: Family Medicine

## 2017-10-27 ENCOUNTER — Encounter: Payer: Self-pay | Admitting: Family Medicine

## 2017-10-27 ENCOUNTER — Ambulatory Visit (INDEPENDENT_AMBULATORY_CARE_PROVIDER_SITE_OTHER): Payer: Medicare Other | Admitting: Family Medicine

## 2017-10-27 VITALS — BP 160/60 | HR 62 | Temp 97.7°F | Resp 16 | Ht 65.0 in | Wt 147.0 lb

## 2017-10-27 DIAGNOSIS — E2839 Other primary ovarian failure: Secondary | ICD-10-CM

## 2017-10-27 DIAGNOSIS — J432 Centrilobular emphysema: Secondary | ICD-10-CM

## 2017-10-27 DIAGNOSIS — Z0000 Encounter for general adult medical examination without abnormal findings: Secondary | ICD-10-CM

## 2017-10-27 DIAGNOSIS — I10 Essential (primary) hypertension: Secondary | ICD-10-CM | POA: Diagnosis not present

## 2017-10-27 DIAGNOSIS — J40 Bronchitis, not specified as acute or chronic: Secondary | ICD-10-CM

## 2017-10-27 DIAGNOSIS — E559 Vitamin D deficiency, unspecified: Secondary | ICD-10-CM

## 2017-10-27 MED ORDER — DOXYCYCLINE HYCLATE 100 MG PO TABS: 100.0000 mg | ORAL_TABLET | Freq: Two times a day (BID) | ORAL | 0 refills | Status: DC

## 2017-10-27 NOTE — Progress Notes (Signed)
Patient: Molly Hodges, Female    DOB: Oct 27, 1931, 84 y.o.   MRN: [id number] Visit Date: 10/27/2017  Today's Provider: Lelon Huh, MD   Chief Complaint  Patient presents with  . Annual Exam  . Hypertension  . Atrial Fibrillation  . Asthma  . COPD   Subjective:   Patient saw McKenzie for AWV on 10/19/2017.   Complete Physical Molly Hodges is a 84 y.o. female. She feels well. She reports no regular exercise, but does housework daily . She reports she is sleeping well.  -----------------------------------------------------------   Hypertension, follow-up:  BP Readings from Last 3 Encounters:  10/19/17 (!) 164/60  08/19/17 (!) 144/60  11/27/16 (!) 126/50    She was last seen for hypertension 1 years ago.  BP at that visit was 102/70. Management since that visit includes; labs checked, no changes.She reports good compliance with treatment. She is not having side effects.  She is not exercising. She is adherent to low salt diet.   Outside blood pressures are 120/55. She is experiencing none.  Patient denies chest pain, chest pressure/discomfort, claudication, dyspnea, exertional chest pressure/discomfort, fatigue, irregular heart beat, lower extremity edema, near-syncope, orthopnea, palpitations, paroxysmal nocturnal dyspnea, syncope and tachypnea.   Cardiovascular risk factors include advanced age (older than 76 for men, 49 for women) and hypertension.  Use of agents associated with hypertension: none.   ------------------------------------------------------------------------  Vitamin D deficiency From 11/11/2016-labs checked, no changes. Patient reports good compliance with treatment. Lab Results  Component Value Date   VD25OH 28.7 (L) 11/13/2016     Persistent atrial fibrillation ([place]) From 11/11/2016-no changes. Managed by Dr. Rockey Situ.   Chronic obstructive pulmonary disease, unspecified COPD type ([place]) From 11/11/2016-given samples of Tudorza  1 puff twice a day and given prescription to fill if effective. Later Reinaldo Meeker was changed to spriva due to insurance formulary. Patient reports this problem is stable, but she has had a cough for several weeks.   Uncomplicated asthma, unspecified asthma severity, unspecified whether persistent From 11/11/2016-no changes. Today patient reports this problem is somewhat stable, but she has had some cough and wheezes. States she is now productive of yellow sputum. No dyspnea. Is wheezing at night. Is using Advair. States that albuterol made her heart race. Spriva didn't help.     Review of Systems  Constitutional: Negative for chills, fatigue and fever.  HENT: Positive for congestion. Negative for ear pain, rhinorrhea, sneezing and sore throat.   Eyes: Negative.  Negative for pain and redness.  Respiratory: Positive for cough and wheezing. Negative for shortness of breath.   Cardiovascular: Negative for chest pain and leg swelling.  Gastrointestinal: Negative for abdominal pain, blood in stool, constipation, diarrhea and nausea.  Endocrine: Negative for polydipsia and polyphagia.  Genitourinary: Negative.  Negative for dysuria, flank pain, hematuria, pelvic pain, vaginal bleeding and vaginal discharge.  Musculoskeletal: Negative for arthralgias, back pain, gait problem and joint swelling.  Skin: Negative for rash.  Neurological: Negative.  Negative for dizziness, tremors, seizures, weakness, light-headedness, numbness and headaches.  Hematological: Negative for adenopathy.  Psychiatric/Behavioral: Negative.  Negative for behavioral problems, confusion and dysphoric mood. The patient is not nervous/anxious and is not hyperactive.     Social History   Socioeconomic History  . Marital status: Married    Spouse name: Not on file  . Number of children: 4  . Years of education: Not on file  . Highest education level: 12th grade  Social Needs  . Financial resource strain:  Not hard at all  .  Food insecurity - worry: Never true  . Food insecurity - inability: Never true  . Transportation needs - medical: No  . Transportation needs - non-medical: No  Occupational History  . Occupation: retired  Tobacco Use  . Smoking status: Never Smoker  . Smokeless tobacco: Never Used  Substance and Sexual Activity  . Alcohol use: No  . Drug use: Yes    Types: Nitrous oxide  . Sexual activity: Not on file  Other Topics Concern  . Not on file  Social History Narrative   Lives at home with Husband. Active and Independent at baseline.    Past Medical History:  Diagnosis Date  . Community acquired pneumonia   . Glaucoma   . New onset atrial fibrillation ([place]) 09/17/2015   a. On Eliquis  . Sepsis ([place]) 09/17/2015     Patient Active Problem List   Diagnosis Date Noted  . Long-term use of high-risk medication 11/11/2016  . COPD (chronic obstructive pulmonary disease) ([place]) 11/11/2016  . Lower extremity edema 05/29/2016  . Acute diastolic CHF (congestive heart failure) ([place]) 05/29/2016  . Paroxysmal atrial fibrillation ([facility])   . Elevated troponin   . Hypoxia   . Encounter for anticoagulation discussion and counseling   . Premature ventricular contraction 03/26/2015  . Vitamin D deficiency 03/26/2015  . Peripheral neuropathy 03/26/2015  . Hypertension 03/26/2015  . Asthma 03/26/2015    Past Surgical History:  Procedure Laterality Date  . ABDOMINAL HYSTERECTOMY  1987   due to heavy bleeding  . APPENDECTOMY    . CATARACT EXTRACTION    . ELECTROPHYSIOLOGIC STUDY N/A 11/15/2015   Procedure: CARDIOVERSION;  Surgeon: Minna Merritts, MD;  Location: [facility] ORS;  Service: Cardiovascular;  Laterality: N/A;  . TONSILLECTOMY      Her family history includes CAD in her father and mother; Cancer (age of onset: 58) in her son.      Current Outpatient Medications:  .  ADVAIR DISKUS 250-50 MCG/DOSE AEPB, INHALE ONE (1) PUFF EVERY TWELVE HOURS; RINSE MOUTH AFTER USE, Disp: 60 each, Rfl:  12 .  amiodarone (PACERONE) 200 MG tablet, TAKE ONE TABLET BY MOUTH EVERY DAY AS DIRECTED, Disp: 90 tablet, Rfl: 3 .  Calcium Carbonate-Vitamin D (CALCIUM 600+D) 600-400 MG-UNIT tablet, Take 1 tablet by mouth daily., Disp: , Rfl:  .  cholecalciferol (VITAMIN D) 1000 UNITS tablet, Take 1,000 Units by mouth daily., Disp: , Rfl:  .  diltiazem (CARDIZEM CD) 180 MG 24 hr capsule, TAKE 1 CAPSULE EVERY DAY, Disp: 30 capsule, Rfl: 11 .  dorzolamide (TRUSOPT) 2 % ophthalmic solution, Place 1 drop into both eyes 2 (two) times daily. , Disp: , Rfl:  .  ELIQUIS 5 MG TABS tablet, TAKE ONE TABLET BY MOUTH TWICE DAILY, Disp: 60 tablet, Rfl: 12 .  gabapentin (NEURONTIN) 300 MG capsule, TAKE 1 CAPSULE BY MOUTH EVERY MORNING AND TAKE 2 CAPSULES AT BEDTIME, Disp: 90 capsule, Rfl: 4 .  hydrochlorothiazide (MICROZIDE) 12.5 MG capsule, TAKE 1 CAPSULE BY MOUTH EVERY DAY, Disp: 30 capsule, Rfl: 12 .  latanoprost (XALATAN) 0.005 % ophthalmic solution, Place 1 drop into both eyes at bedtime. , Disp: , Rfl:  .  magnesium oxide (MAG-OX) 400 MG tablet, Take 400 mg by mouth daily., Disp: , Rfl:  .  Omega-3 Fatty Acids (FISH OIL BURP-LESS) 1000 MG CAPS, Take 1,000 mg by mouth daily. , Disp: , Rfl:  .  potassium chloride (K-DUR) 10 MEQ tablet, Take 1 tablet (10 mEq  total) by mouth 2 (two) times daily as needed., Disp: 60 tablet, Rfl: 6 .  tiotropium (SPIRIVA HANDIHALER) 18 MCG inhalation capsule, Place 1 capsule (18 mcg total) into inhaler and inhale daily., Disp: 30 capsule, Rfl: 12 .  vitamin C (ASCORBIC ACID) 500 MG tablet, Take 1,000 mg by mouth daily., Disp: , Rfl:  .  vitamin E 100 UNIT capsule, Take 200 Units by mouth daily., Disp: , Rfl:  .  furosemide (LASIX) 20 MG tablet, Take 1 tablet (20 mg total) by mouth daily as needed., Disp: 30 tablet, Rfl: 6  Patient Care Team: Birdie Sons, MD as PCP - General (Family Medicine) Rockey Situ, Kathlene November, MD as Consulting Physician (Cardiology) Leandrew Koyanagi, MD as  Referring Physician (Ophthalmology)     Objective:   Vitals: BP (!) 160/60 (BP Location: Right Arm, Cuff Size: Normal)   Pulse 62   Temp 97.7 F (36.5 C) (Oral)   Resp 16   Ht 5\' 5"  (1.651 m)   Wt 147 lb (66.7 kg)   SpO2 97% Comment: room air  BMI 24.46 kg/m   Physical Exam   General Appearance:    Alert, cooperative, no distress, appears stated age  Head:    Normocephalic, without obvious abnormality, atraumatic  Eyes:    PERRL, conjunctiva/corneas clear, EOM's intact, fundi    benign, both eyes  Ears:    Normal TM's and external ear canals, both ears  Nose:   Nares normal, septum midline, mucosa normal, no drainage    or sinus tenderness  Throat:   Lips, mucosa, and tongue normal; teeth and gums normal  Neck:   Supple, symmetrical, trachea midline, no adenopathy;    thyroid:  no enlargement/tenderness/nodules; no carotid   bruit or JVD  Back:     Symmetric, no curvature, ROM normal, no CVA tenderness  Lungs:     Occasional expiratory wheeze, no rales, , respirations unlabored  Chest Wall:    No tenderness or deformity   Heart:    Regular rate and rhythm, S1 and S2 normal, no murmur, rub   or gallop  Breast Exam:    deferred  Abdomen:     Soft, non-tender, bowel sounds active all four quadrants,    no masses, no organomegaly  Pelvic:    deferred  Extremities:   Extremities normal, atraumatic, no cyanosis or edema  Pulses:   2+ and symmetric all extremities  Skin:   Skin color, texture, turgor normal, no rashes or lesions  Lymph nodes:   Cervical, supraclavicular, and axillary nodes normal  Neurologic:   CNII-XII intact, normal strength, sensation and reflexes    throughout    Activities of Daily Living In your present state of health, do you have any difficulty performing the following activities: 10/19/2017 11/05/2016  Hearing? N N  Vision? Y N  Comment Due to trouble with peripheral vision. -  Difficulty concentrating or making decisions? N N  Walking or climbing  stairs? N N  Dressing or bathing? N N  Doing errands, shopping? Y N  Comment Does not drive due to peripheral vision issues.  -  Preparing Food and eating ? N N  Using the Toilet? N N  In the past six months, have you accidently leaked urine? N N  Do you have problems with loss of bowel control? N N  Managing your Medications? N N  Managing your Finances? N N  Housekeeping or managing your Housekeeping? N N  Some recent data might be hidden  Fall Risk Assessment Fall Risk  10/19/2017 11/05/2016  Falls in the past year? No No     Depression Screen PHQ 2/9 Scores 10/19/2017 11/05/2016 11/05/2016  PHQ - 2 Score 0 0 0  PHQ- 9 Score - 0 -      Assessment & Plan:    Annual Physical Reviewed patient's Family Medical History Reviewed and updated list of patient's medical providers Assessment of cognitive impairment was done Assessed patient's functional ability Established a written schedule for health screening [place] Completed and Reviewed  Exercise Activities and Dietary recommendations Goals    . DIET - INCREASE WATER INTAKE     Recommend drinking 6 glasses of water a day.        Immunization History  Administered Date(s) Administered  . Influenza, High Dose Seasonal PF 08/28/2017  . Pneumococcal Conjugate-13 05/30/2014  . Pneumococcal Polysaccharide-23 06/09/1985, 06/15/2006    Health Maintenance  Topic Date Due  . TETANUS/TDAP  08/18/2026 (Originally 10/20/1950)  . INFLUENZA VACCINE  Completed  . DEXA SCAN  Completed  . PNA vac Low Risk Adult  Completed     Discussed health benefits of physical activity, and encouraged her to engage in regular exercise appropriate for her age and condition.    ------------------------------------------------------------------------------------------------------------  1. Essential hypertension Well controlled.  Continue current medications.   - Comprehensive metabolic panel  2. Annual physical  exam   3. Vitamin D deficiency  - VITAMIN D 25 Hydroxy (Vit-D Deficiency, Fractures)  4. Centrilobular emphysema ([place]) Stabile on current inhalers  5. Bronchitis  - doxycycline (VIBRA-TABS) 100 MG tablet; Take 1 tablet (100 mg total) by mouth 2 (two) times daily.  Dispense: 20 tablet; Refill: 0 Call if symptoms change or if not rapidly improving.     6. Estrogen deficiency  - DG Bone Density; Future   Lelon Huh, MD  [facility]

## 2017-10-28 ENCOUNTER — Telehealth: Payer: Self-pay | Admitting: *Deleted

## 2017-10-28 LAB — VITAMIN D 25 HYDROXY (VIT D DEFICIENCY, FRACTURES): Vit D, 25-Hydroxy: 28.6 ng/mL — ABNORMAL LOW (ref 30.0–100.0)

## 2017-10-28 LAB — COMPREHENSIVE METABOLIC PANEL
ALT: 29 IU/L (ref 0–32)
AST: 21 IU/L (ref 0–40)
Albumin/Globulin Ratio: 1.7 (ref 1.2–2.2)
Albumin: 4.5 g/dL (ref 3.5–4.7)
Alkaline Phosphatase: 89 IU/L (ref 39–117)
BUN/Creatinine Ratio: 16 (ref 12–28)
BUN: 16 mg/dL (ref 8–27)
Bilirubin Total: 0.4 mg/dL (ref 0.0–1.2)
CO2: 23 mmol/L (ref 20–29)
Calcium: 9.4 mg/dL (ref 8.7–10.3)
Chloride: 99 mmol/L (ref 96–106)
Creatinine, Ser: 0.97 mg/dL (ref 0.57–1.00)
GFR calc Af Amer: 62 mL/min/{1.73_m2} (ref >59)
GFR calc non Af Amer: 54 mL/min/{1.73_m2} — ABNORMAL LOW (ref >59)
Globulin, Total: 2.6 g/dL (ref 1.5–4.5)
Glucose: 85 mg/dL (ref 65–99)
Potassium: 3.7 mmol/L (ref 3.5–5.2)
Sodium: 140 mmol/L (ref 134–144)
Total Protein: 7.1 g/dL (ref 6.0–8.5)

## 2017-10-28 NOTE — Telephone Encounter (Signed)
Patient is returning Michelle's call.

## 2017-10-28 NOTE — Telephone Encounter (Signed)
LMOVM for pt to return call 

## 2017-10-28 NOTE — Telephone Encounter (Signed)
-----   Message from Birdie Sons, MD sent at 10/28/2017  8:20 AM EDT ----- Vitamin d level slightly low. Rest of labs are normal. Continue current medications. Check yearly.

## 2017-10-30 NOTE — Telephone Encounter (Signed)
Patient advised and verbally voiced understanding.  

## 2017-11-02 DIAGNOSIS — H353132 Nonexudative age-related macular degeneration, bilateral, intermediate dry stage: Secondary | ICD-10-CM | POA: Diagnosis not present

## 2017-11-03 ENCOUNTER — Telehealth: Payer: Self-pay | Admitting: Family Medicine

## 2017-11-03 NOTE — Telephone Encounter (Signed)
Bone density was scheduled but has been cancelled at pt's request.She states she has to much going on at present time and will call our office when ready to schedule

## 2017-11-26 ENCOUNTER — Other Ambulatory Visit: Payer: Medicare Other

## 2018-02-03 ENCOUNTER — Other Ambulatory Visit: Payer: Self-pay | Admitting: Cardiovascular Disease

## 2018-02-26 ENCOUNTER — Other Ambulatory Visit: Payer: Self-pay | Admitting: Family Medicine

## 2018-04-14 ENCOUNTER — Other Ambulatory Visit: Payer: Self-pay | Admitting: Family Medicine

## 2018-04-14 ENCOUNTER — Encounter: Payer: Self-pay | Admitting: Family Medicine

## 2018-05-04 DIAGNOSIS — H401131 Primary open-angle glaucoma, bilateral, mild stage: Secondary | ICD-10-CM | POA: Diagnosis not present

## 2018-05-08 ENCOUNTER — Other Ambulatory Visit: Payer: Self-pay | Admitting: Cardiovascular Disease

## 2018-05-10 DIAGNOSIS — H401131 Primary open-angle glaucoma, bilateral, mild stage: Secondary | ICD-10-CM | POA: Diagnosis not present

## 2018-05-11 ENCOUNTER — Telehealth: Payer: Self-pay | Admitting: Cardiovascular Disease

## 2018-05-11 ENCOUNTER — Other Ambulatory Visit: Payer: Self-pay

## 2018-05-11 MED ORDER — AMIODARONE HCL 200 MG PO TABS: 200.0000 mg | ORAL_TABLET | Freq: Every day | ORAL | 0 refills | Status: DC

## 2018-05-11 NOTE — Telephone Encounter (Signed)
Patient needs to schedule an appointment for further refills °

## 2018-05-11 NOTE — Telephone Encounter (Signed)
°*  STAT* If patient is at the pharmacy, call can be transferred to refill team. ° ° °1. Which medications need to be refilled? (please list name of each medication and dose if known) amiodarone 200 MG 1 tablet daily  ° °2. Which pharmacy/location (including street and city if local pharmacy) is medication to be sent to? [facility]  ° °3. Do they need a 30 day or 90 day supply? 90 day  ° °

## 2018-05-29 ENCOUNTER — Ambulatory Visit (INDEPENDENT_AMBULATORY_CARE_PROVIDER_SITE_OTHER): Payer: Medicare Other

## 2018-05-29 DIAGNOSIS — Z23 Encounter for immunization: Secondary | ICD-10-CM

## 2018-06-08 ENCOUNTER — Other Ambulatory Visit: Payer: Self-pay | Admitting: Cardiovascular Disease

## 2018-06-11 ENCOUNTER — Other Ambulatory Visit: Payer: Self-pay | Admitting: Family Medicine

## 2018-06-11 ENCOUNTER — Other Ambulatory Visit: Payer: Self-pay | Admitting: Cardiovascular Disease

## 2018-07-04 ENCOUNTER — Encounter: Payer: Self-pay | Admitting: Physician Assistant

## 2018-07-04 NOTE — Progress Notes (Signed)
Cardiology Office Note Date:  07/08/2018  Patient ID:  Molly Hodges, Molly Hodges September 17, 1931, MRN [id number] PCP:  Birdie Sons, MD  Cardiologist:  Dr. Rockey Situ, MD    Chief Complaint: Follow up  History of Present Illness: Molly Hodges is a 84 y.o. female with history of PAF on Eliquis s/p DCCV 11/15/2015, mild mitral regurgitation, COPD, asthma, PNA, and HTN who presents for follow up of her Afib.   She was admitted in the hospital in 08/2015 for PNA and found to be in new onset Afib with RVR. CHADS2VASc of 4 (HTN, age x 2, female), was placed on Eliquis and rate controlled with calcium channel blockers (attempted to avoid beta blockers given her asthma). Echo at that time showed an EF of 60-65%, normal wall motion, mild MR, mildly dilated left atrium measuring 40 mm, RVSF normal, PASP normal. She ultimately underwent successful DCCV on 11/15/2015 after she had been adequately anticoagulated. She was last seen in the office in 11/2016 for follow up and was doing well.   Labs: 10/2017 - K+ 3.7, SCr 0.97, LFT normal 09/2016 - TSH 5.51, total T4 10.8, unremarkable CBC  She comes in doing well today.  She does not have any issues or concerns.  No further symptoms concerning for palpitations or A. fib.  Patient indicates back in 2017 she was unable to feel palpitations though did note significant fatigue which she feels like was her main symptom.  She has not felt anything similar to this since.  She is tolerating amiodarone, Eliquis, and Cardizem without issues.  No falls, BRBPR, or melena since she was last seen.  Blood pressure typically runs from 939-030 systolic.  She does note some fatigue if her blood pressure trends on the lower side such as 092 systolic.  No lower extremity swelling, abdominal distention, orthopnea, early satiety.  She has stable asthma.   Past Medical History:  Diagnosis Date  . Asthma   . Community acquired pneumonia   . COPD (chronic obstructive pulmonary disease)  ([place])   . Essential hypertension   . Glaucoma   . Mitral regurgitation    a. TTE 08/2015: EF 60-65%, normal wall motion, mild MR, mildly dilated left atrium measuring 40 mm, RVSF normal, PASP normal  . PAF (paroxysmal atrial fibrillation) ([place]) 09/17/2015   a. s/p DCCV 11/15/2015; CHADS2VASc => 4 (HTN, age x 2, female); c. on Eliquis    Past Surgical History:  Procedure Laterality Date  . ABDOMINAL HYSTERECTOMY  1987   due to heavy bleeding  . APPENDECTOMY    . CATARACT EXTRACTION    . ELECTROPHYSIOLOGIC STUDY N/A 11/15/2015   Procedure: CARDIOVERSION;  Surgeon: Minna Merritts, MD;  Location: [facility] ORS;  Service: Cardiovascular;  Laterality: N/A;  . TONSILLECTOMY      Current Meds  Medication Sig  . ADVAIR DISKUS 250-50 MCG/DOSE AEPB INHALE ONE (1) PUFF EVERY TWELVE HOURS; RINSE MOUTH AFTER USE  . amiodarone (PACERONE) 200 MG tablet TAKE ONE TABLET EVERY DAY  . Calcium Carbonate-Vitamin D (CALCIUM 600+D) 600-400 MG-UNIT tablet Take 1 tablet by mouth daily.  . cholecalciferol (VITAMIN D) 1000 UNITS tablet Take 1,000 Units by mouth daily.  Marland Kitchen diltiazem (CARDIZEM CD) 180 MG 24 hr capsule TAKE 1 CAPSULE EVERY DAY  . dorzolamide (TRUSOPT) 2 % ophthalmic solution Place 1 drop into both eyes 2 (two) times daily.   Marland Kitchen ELIQUIS 5 MG TABS tablet TAKE ONE TABLET BY MOUTH TWICE DAILY  . furosemide (LASIX) 20 MG tablet Take  1 tablet (20 mg total) by mouth daily as needed.  . gabapentin (NEURONTIN) 300 MG capsule TAKE 1 CAPSULE BY MOUTH EVERY MORNING AND 2 CAPSULES AT BEDTIME  . hydrochlorothiazide (MICROZIDE) 12.5 MG capsule TAKE 1 CAPSULE BY MOUTH EVERY DAY  . latanoprost (XALATAN) 0.005 % ophthalmic solution Place 1 drop into both eyes at bedtime.   . magnesium oxide (MAG-OX) 400 MG tablet Take 400 mg by mouth daily.  . Omega-3 Fatty Acids (FISH OIL BURP-LESS) 1000 MG CAPS Take 1,000 mg by mouth daily.   . potassium chloride (K-DUR) 10 MEQ tablet Take 1 tablet (10 mEq total) by mouth 2 (two) times  daily as needed.  . vitamin C (ASCORBIC ACID) 500 MG tablet Take 1,000 mg by mouth daily.  . vitamin E 100 UNIT capsule Take 200 Units by mouth daily.    Allergies:   Shellfish allergy; Albuterol; Azithromycin; and Tamiflu [oseltamivir phosphate]   Social History:  The patient  reports that she has never smoked. She has never used smokeless tobacco. She reports that she has current or past drug history. Drug: Nitrous oxide. She reports that she does not drink alcohol.   Family History:  The patient's family history includes CAD in her father and mother; Cancer (age of onset: 63) in her son.  ROS:   Review of Systems  Constitutional: Negative for chills, diaphoresis, fever, malaise/fatigue and weight loss.  HENT: Negative for congestion.   Eyes: Negative for discharge and redness.  Respiratory: Negative for cough, hemoptysis, sputum production, shortness of breath and wheezing.   Cardiovascular: Negative for chest pain, palpitations, orthopnea, claudication, leg swelling and PND.  Gastrointestinal: Negative for abdominal pain, blood in stool, heartburn, melena, nausea and vomiting.  Genitourinary: Negative for hematuria.  Musculoskeletal: Negative for falls and myalgias.  Skin: Negative for rash.  Neurological: Negative for dizziness, tingling, tremors, sensory change, speech change, focal weakness, loss of consciousness and weakness.  Endo/Heme/Allergies: Does not bruise/bleed easily.  Psychiatric/Behavioral: Negative for substance abuse. The patient is not nervous/anxious.   All other systems reviewed and are negative.    PHYSICAL EXAM:  VS:  BP (!) 140/58 (BP Location: Left Arm, Patient Position: Sitting, Cuff Size: Normal)   Pulse 67   Ht 5' 4.5" (1.638 m)   Wt 145 lb 8 oz (66 kg)   BMI 24.59 kg/m  BMI: Body mass index is 24.59 kg/m.  Physical Exam  Constitutional: She is oriented to person, place, and time. She appears well-developed and well-nourished.  HENT:  Head:  Normocephalic and atraumatic.  Eyes: Right eye exhibits no discharge. Left eye exhibits no discharge.  Neck: Normal range of motion. No JVD present.  Cardiovascular: Normal rate, regular rhythm, S1 normal, S2 normal and normal heart sounds. Exam reveals no distant heart sounds, no friction rub, no midsystolic click and no opening snap.  No murmur heard. Pulses:      Dorsalis pedis pulses are 2+ on the right side, and 2+ on the left side.       Posterior tibial pulses are 2+ on the right side, and 2+ on the left side.  Pulmonary/Chest: Effort normal and breath sounds normal. No respiratory distress. She has no decreased breath sounds. She has no wheezes. She has no rales. She exhibits no tenderness.  Abdominal: Soft. She exhibits no distension. There is no tenderness.  Musculoskeletal: She exhibits no edema.  Neurological: She is alert and oriented to person, place, and time.  Skin: Skin is warm and dry. No cyanosis. Nails  show no clubbing.  Psychiatric: She has a normal mood and affect. Her speech is normal and behavior is normal. Judgment and thought content normal.     EKG:  Was ordered and interpreted by me today. Shows NSR, 67 bpm, first-degree AV block, baseline wandering, no acute ST-T changes  Recent Labs: 10/27/2017: ALT 29; BUN 16; Creatinine, Ser 0.97; Potassium 3.7; Sodium 140  No results found for requested labs within last 8760 hours.   CrCl cannot be calculated (Patient's most recent lab result is older than the maximum 21 days allowed.).   Wt Readings from Last 3 Encounters:  07/08/18 145 lb 8 oz (66 kg)  10/27/17 147 lb (66.7 kg)  10/19/17 147 lb 6.4 oz (66.9 kg)     Other studies reviewed: Additional studies/records reviewed today include: summarized above  ASSESSMENT AND PLAN:  1. PAF: Remains in sinus rhythm with well-controlled heart rate.  Continue amiodarone 200 mg daily, Cardizem CD 180 mg daily, and Eliquis 5 mg twice daily.  CHADS2VASc 4.  Check amiodarone  surveillance labs including TSH and CMP.  Check CBC given Eliquis usage.  2. Hypertension: Blood pressure is reasonably controlled today at 140/58.  Patient notes significant fatigue with blood pressure in the 1 teens systolic.  Because of this, we will continue her on her current medications.  Blood pressure is better controlled than most recent visits in 10/2017.  3. COPD/asthma: Stable.  Followed by PCP.  4. Mitral regurgitation: No murmur heard on exam today. Asymptomatic. Consider follow up echo at next visit.   Disposition: F/u with Dr. Rockey Situ or an APP in 12 months.   Current medicines are reviewed at length with the patient today.  The patient did not have any concerns regarding medicines.  Melvern Banker PA-C 07/08/2018 9:28 AM     [place] [facility] [address] [place],  [address] [phone]

## 2018-07-06 ENCOUNTER — Other Ambulatory Visit: Payer: Self-pay | Admitting: Cardiovascular Disease

## 2018-07-08 ENCOUNTER — Encounter: Payer: Self-pay | Admitting: Physician Assistant

## 2018-07-08 ENCOUNTER — Ambulatory Visit: Payer: Medicare Other | Admitting: Physician Assistant

## 2018-07-08 VITALS — BP 140/58 | HR 67 | Ht 64.5 in | Wt 145.5 lb

## 2018-07-08 DIAGNOSIS — I10 Essential (primary) hypertension: Secondary | ICD-10-CM

## 2018-07-08 DIAGNOSIS — Z79899 Other long term (current) drug therapy: Secondary | ICD-10-CM

## 2018-07-08 DIAGNOSIS — I493 Ventricular premature depolarization: Secondary | ICD-10-CM | POA: Diagnosis not present

## 2018-07-08 DIAGNOSIS — J432 Centrilobular emphysema: Secondary | ICD-10-CM

## 2018-07-08 DIAGNOSIS — I480 Paroxysmal atrial fibrillation: Secondary | ICD-10-CM

## 2018-07-08 DIAGNOSIS — I340 Nonrheumatic mitral (valve) insufficiency: Secondary | ICD-10-CM

## 2018-07-08 NOTE — Patient Instructions (Signed)
Medication Instructions:  Your physician recommends that you continue on your current medications as directed. Please refer to the Current Medication list given to you today.  If you need a refill on your cardiac medications before your next appointment, please call your pharmacy.   Lab work: Your physician recommends that you return for lab work in: today   If you have labs (blood work) drawn today and your tests are completely normal, you will receive your results only by: Marland Kitchen MyChart Message (if you have MyChart) OR . A paper copy in the mail If you have any lab test that is abnormal or we need to change your treatment, we will call you to review the results.  Testing/Procedures: NONE   Follow-Up: At [facility], you and your health needs are our priority.  As part of our continuing mission to provide you with exceptional heart care, we have created designated Provider Care Teams.  These Care Teams include your primary Cardiologist (physician) and Advanced Practice Providers (APPs -  Physician Assistants and Nurse Practitioners) who all work together to provide you with the care you need, when you need it. You will need a follow up appointment in 1 years.  Please call our office 2 months in advance to schedule this appointment.  You may see Dr. Rockey Situ or one of the following Advanced Practice Providers on your designated Care Team:   Murray Hodgkins, NP Christell Faith, PA-C . Marrianne Mood, PA-C  Any Other Special Instructions Will Be Listed Below (If Applicable). Thank you for choosing [facility]!

## 2018-07-09 LAB — COMPREHENSIVE METABOLIC PANEL
ALT: 30 IU/L (ref 0–32)
AST: 20 IU/L (ref 0–40)
Albumin/Globulin Ratio: 1.6 (ref 1.2–2.2)
Albumin: 3.9 g/dL (ref 3.5–4.7)
Alkaline Phosphatase: 82 IU/L (ref 39–117)
BUN/Creatinine Ratio: 16 (ref 12–28)
BUN: 16 mg/dL (ref 8–27)
Bilirubin Total: 0.3 mg/dL (ref 0.0–1.2)
CO2: 25 mmol/L (ref 20–29)
Calcium: 9 mg/dL (ref 8.7–10.3)
Chloride: 105 mmol/L (ref 96–106)
Creatinine, Ser: 1.01 mg/dL — ABNORMAL HIGH (ref 0.57–1.00)
GFR calc Af Amer: 59 mL/min/{1.73_m2} — ABNORMAL LOW (ref >59)
GFR calc non Af Amer: 51 mL/min/{1.73_m2} — ABNORMAL LOW (ref >59)
Globulin, Total: 2.4 g/dL (ref 1.5–4.5)
Glucose: 92 mg/dL (ref 65–99)
Potassium: 4.5 mmol/L (ref 3.5–5.2)
Sodium: 145 mmol/L — ABNORMAL HIGH (ref 134–144)
Total Protein: 6.3 g/dL (ref 6.0–8.5)

## 2018-07-09 LAB — CBC
Hematocrit: 40.9 % (ref 34.0–46.6)
Hemoglobin: 13.1 g/dL (ref 11.1–15.9)
MCH: 26.4 pg — ABNORMAL LOW (ref 26.6–33.0)
MCHC: 32 g/dL (ref 31.5–35.7)
MCV: 83 fL (ref 79–97)
Platelets: 218 10*3/uL (ref 150–450)
RBC: 4.96 x10E6/uL (ref 3.77–5.28)
RDW: 15.5 % — ABNORMAL HIGH (ref 12.3–15.4)
WBC: 7.5 10*3/uL (ref 3.4–10.8)

## 2018-07-09 LAB — TSH: TSH: 6.29 u[IU]/mL — ABNORMAL HIGH (ref 0.450–4.500)

## 2018-07-14 LAB — T3: T3, Total: 100 ng/dL (ref 71–180)

## 2018-07-14 LAB — SPECIMEN STATUS REPORT

## 2018-07-14 LAB — T4, FREE: Free T4: 1.58 ng/dL (ref 0.82–1.77)

## 2018-07-19 ENCOUNTER — Other Ambulatory Visit: Payer: Self-pay | Admitting: Family Medicine

## 2018-08-02 ENCOUNTER — Other Ambulatory Visit: Payer: Self-pay | Admitting: Cardiovascular Disease

## 2018-08-31 ENCOUNTER — Other Ambulatory Visit: Payer: Self-pay | Admitting: Family Medicine

## 2018-10-21 ENCOUNTER — Ambulatory Visit: Payer: Self-pay

## 2018-11-02 ENCOUNTER — Ambulatory Visit: Payer: Self-pay

## 2018-11-15 ENCOUNTER — Telehealth: Payer: Self-pay

## 2018-11-15 NOTE — Telephone Encounter (Signed)
Called pt to change telephonic AWV to virtual visit and pt declined stating that she did not have the equipment to do so. Inquired about r/s the face to face apt later this year and pt declined and stated she would CB to schedule this.  -MM

## 2018-11-16 ENCOUNTER — Ambulatory Visit: Payer: Self-pay

## 2019-01-06 DIAGNOSIS — H401131 Primary open-angle glaucoma, bilateral, mild stage: Secondary | ICD-10-CM | POA: Diagnosis not present

## 2019-01-06 DIAGNOSIS — H353132 Nonexudative age-related macular degeneration, bilateral, intermediate dry stage: Secondary | ICD-10-CM | POA: Diagnosis not present

## 2019-03-18 DIAGNOSIS — H401131 Primary open-angle glaucoma, bilateral, mild stage: Secondary | ICD-10-CM | POA: Diagnosis not present

## 2019-04-21 ENCOUNTER — Other Ambulatory Visit: Payer: Self-pay | Admitting: Family Medicine

## 2019-04-22 DIAGNOSIS — H401131 Primary open-angle glaucoma, bilateral, mild stage: Secondary | ICD-10-CM | POA: Diagnosis not present

## 2019-05-18 ENCOUNTER — Ambulatory Visit (INDEPENDENT_AMBULATORY_CARE_PROVIDER_SITE_OTHER): Payer: Medicare Other

## 2019-05-18 ENCOUNTER — Other Ambulatory Visit: Payer: Self-pay

## 2019-05-18 DIAGNOSIS — Z23 Encounter for immunization: Secondary | ICD-10-CM | POA: Diagnosis not present

## 2019-05-23 ENCOUNTER — Other Ambulatory Visit: Payer: Self-pay | Admitting: Family Medicine

## 2019-07-10 NOTE — Progress Notes (Signed)
Cardiology Office Note  Date:  07/11/2019   ID:  Onesti, Conard [birth year], MRN [id number]  PCP:  Birdie Sons, MD   Chief Complaint  Patient presents with  . other    12 month f/u discuss eliquis. Meds reviewed verbally with pt.    HPI:  Molly Hodges is a 85 y.o. female with  Paroxysmal Afib,  (CHADS2VASc of 4 - HTN, age x 2, female)  COPD,  asthma,  prior episodes of PNA,   hospital admission from 1/30 to 09/21/15,  HTN  who presents Follow-up for her atrial fibrillation,Previous cardioversion  BP at home 120s , at home Elevated today, was rushing into the office  In general doing well Wonders if she needs to stay on blood thinner, Eliquis Reports that she cannot afford it Denies tachycardia palpitations, no recent falls No regular exercise program No chest pain or shortness of breath   EKG personally reviewed by myself on todays visit Shows normal sinus rhythm with rate 69 bpm no significant ST or T-wave changes   Other past medical history reviewed found to be in new onset Afib with RVR on 09/17/15 upon her arrival to [facility], pneumonia started on Eliquis given her stroke risk (CHADS2VASc of 4 - HTN, age x 2, female) and rate controlled.  Beta blockers were avoided given her asthma. Started on thousand channel blocker  Echo showed EF of 60-65%, normal wall motion, not technically sufficient to allow for wall motion, mild MR, left atrium was mildly dilated, RV systolic function was normal, PASP was normal.    PMH:   has a past medical history of Asthma, Community acquired pneumonia, COPD (chronic obstructive pulmonary disease) ([place]), Essential hypertension, Glaucoma, Mitral regurgitation, and PAF (paroxysmal atrial fibrillation) ([place]) (09/17/2015).  PSH:    Past Surgical History:  Procedure Laterality Date  . ABDOMINAL HYSTERECTOMY  1987   due to heavy bleeding  . APPENDECTOMY    . CATARACT EXTRACTION    . ELECTROPHYSIOLOGIC STUDY N/A  11/15/2015   Procedure: CARDIOVERSION;  Surgeon: Minna Merritts, MD;  Location: [facility] ORS;  Service: Cardiovascular;  Laterality: N/A;  . TONSILLECTOMY      Current Outpatient Medications  Medication Sig Dispense Refill  . ADVAIR DISKUS 250-50 MCG/DOSE AEPB INHALE ONE (1) PUFF EVERY TWELVE HOURS; RINSE MOUTH AFTER USE 60 each 12  . amiodarone (PACERONE) 200 MG tablet TAKE ONE TABLET BY MOUTH EVERY 30 tablet 11  . apixaban (ELIQUIS) 5 MG TABS tablet Take 1 tablet (5 mg total) by mouth 2 (two) times daily. 60 tablet 11  . Calcium Carbonate-Vitamin D (CALCIUM 600+D) 600-400 MG-UNIT tablet Take 1 tablet by mouth daily.    . cholecalciferol (VITAMIN D) 1000 UNITS tablet Take 1,000 Units by mouth daily.    Marland Kitchen diltiazem (CARDIZEM CD) 180 MG 24 hr capsule TAKE 1 CAPSULE EVERY DAY 30 capsule 11  . dorzolamide (TRUSOPT) 2 % ophthalmic solution Place 1 drop into both eyes 2 (two) times daily.     . furosemide (LASIX) 20 MG tablet Take 1 tablet (20 mg total) by mouth daily as needed. 30 tablet 6  . gabapentin (NEURONTIN) 300 MG capsule TAKE 1 CAPSULE BY MOUTH EVERY MORNING AND 2 CAPSULES AT BEDTIME 90 capsule 12  . hydrochlorothiazide (MICROZIDE) 12.5 MG capsule TAKE 1 CAPSULE BY MOUTH EVERY DAY 30 capsule 12  . latanoprost (XALATAN) 0.005 % ophthalmic solution Place 1 drop into both eyes at bedtime.     . magnesium oxide (MAG-OX) 400 MG  tablet Take 400 mg by mouth daily.    . Omega-3 Fatty Acids (FISH OIL BURP-LESS) 1000 MG CAPS Take 1,000 mg by mouth daily.     . potassium chloride (K-DUR) 10 MEQ tablet Take 1 tablet (10 mEq total) by mouth 2 (two) times daily as needed. 60 tablet 6  . vitamin C (ASCORBIC ACID) 500 MG tablet Take 1,000 mg by mouth daily.    . vitamin E 100 UNIT capsule Take 200 Units by mouth daily.     No current facility-administered medications for this visit.      Allergies:   Shellfish allergy, Albuterol, Azithromycin, and Tamiflu [oseltamivir phosphate]   Social History:   The patient  reports that she has never smoked. She has never used smokeless tobacco. She reports current drug use. Drug: Nitrous oxide. She reports that she does not drink alcohol.   Family History:   family history includes CAD in her father and mother; Cancer (age of onset: 16) in her son.    Review of Systems: Review of Systems  Constitutional: Negative.   Respiratory: Negative.   Cardiovascular: Negative.   Gastrointestinal: Negative.   Musculoskeletal: Negative.   Neurological: Negative.   Psychiatric/Behavioral: Negative.   All other systems reviewed and are negative.    PHYSICAL EXAM: VS:  BP (!) 152/60 (BP Location: Left Arm, Patient Position: Sitting, Cuff Size: Normal)   Pulse 69   Ht 5\' 5"  (1.651 m)   Wt 145 lb 8 oz (66 kg)   SpO2 97%   BMI 24.21 kg/m  , BMI Body mass index is 24.21 kg/m. Constitutional:  oriented to person, place, and time. No distress.  HENT:  Head: Grossly normal Eyes:  no discharge. No scleral icterus.  Neck: No JVD, no carotid bruits  Cardiovascular: Regular rate and rhythm, no murmurs appreciated Pulmonary/Chest: Clear to auscultation bilaterally, no wheezes or rails Abdominal: Soft.  no distension.  no tenderness.  Musculoskeletal: Normal range of motion Neurological:  normal muscle tone. Coordination normal. No atrophy Skin: Skin warm and dry Psychiatric: normal affect, pleasant   Recent Labs: No results found for requested labs within last 8760 hours.    Lipid Panel No results found for: CHOL, HDL, LDLCALC, TRIG    Wt Readings from Last 3 Encounters:  07/11/19 145 lb 8 oz (66 kg)  07/08/18 145 lb 8 oz (66 kg)  10/27/17 147 lb (66.7 kg)       ASSESSMENT AND PLAN:  Paroxysmal atrial fibrillation (HCC) - Plan: EKG 12-Lead Denies having any episodes Long discussion risk-benefit of anticoagulation After further discussion, she will stay on Eliquis New prescription sent in  Essential hypertension Elevated on arrival,  reports it is 120 at home No changes made Routine lab work has been ordered  Encounter for anticoagulation discussion and counseling Long discussion with her risk and benefit She will stay on Eliquis  Centrilobular emphysema ([place]) Denies significant shortness of breath episodes Recommended walking program   Total encounter time more than 25 minutes  Greater than 50% was spent in counseling and coordination of care with the patient   Disposition:   F/U  12 months   Orders Placed This Encounter  Procedures  . EKG 12-Lead     Signed, Esmond Plants, M.D., Ph.D. 07/11/2019  [place], [place]

## 2019-07-11 ENCOUNTER — Other Ambulatory Visit: Payer: Self-pay

## 2019-07-11 ENCOUNTER — Encounter: Payer: Self-pay | Admitting: Cardiovascular Disease

## 2019-07-11 ENCOUNTER — Ambulatory Visit (INDEPENDENT_AMBULATORY_CARE_PROVIDER_SITE_OTHER): Payer: Medicare Other | Admitting: Cardiovascular Disease

## 2019-07-11 VITALS — BP 152/60 | HR 69 | Ht 65.0 in | Wt 145.5 lb

## 2019-07-11 DIAGNOSIS — J432 Centrilobular emphysema: Secondary | ICD-10-CM

## 2019-07-11 DIAGNOSIS — I10 Essential (primary) hypertension: Secondary | ICD-10-CM

## 2019-07-11 DIAGNOSIS — I480 Paroxysmal atrial fibrillation: Secondary | ICD-10-CM | POA: Diagnosis not present

## 2019-07-11 DIAGNOSIS — I340 Nonrheumatic mitral (valve) insufficiency: Secondary | ICD-10-CM

## 2019-07-11 DIAGNOSIS — Z13220 Encounter for screening for lipoid disorders: Secondary | ICD-10-CM | POA: Diagnosis not present

## 2019-07-11 MED ORDER — APIXABAN 5 MG PO TABS: 5.0000 mg | ORAL_TABLET | Freq: Two times a day (BID) | ORAL | 11 refills | Status: DC

## 2019-07-11 NOTE — Patient Instructions (Addendum)
Labs today: TSH, LFTS, lipids, CMP   Medication Instructions:  No changes  If you need a refill on your cardiac medications before your next appointment, please call your pharmacy.    Lab work: No new labs needed   If you have labs (blood work) drawn today and your tests are completely normal, you will receive your results only by: Marland Kitchen MyChart Message (if you have MyChart) OR . A paper copy in the mail If you have any lab test that is abnormal or we need to change your treatment, we will call you to review the results.   Testing/Procedures: No new testing needed   Follow-Up: At [facility], you and your health needs are our priority.  As part of our continuing mission to provide you with exceptional heart care, we have created designated Provider Care Teams.  These Care Teams include your primary Cardiologist (physician) and Advanced Practice Providers (APPs -  Physician Assistants and Nurse Practitioners) who all work together to provide you with the care you need, when you need it.  . You will need a follow up appointment in 12 months  . Providers on your designated Care Team:   . Murray Hodgkins, NP . Christell Faith, PA-C . Marrianne Mood, PA-C  Any Other Special Instructions Will Be Listed Below (If Applicable).  For educational health videos Log in to : [email or web address] Or : [email or web address], password : [email or web address]

## 2019-07-12 LAB — HEPATIC FUNCTION PANEL: Bilirubin, Direct: 0.11 mg/dL (ref 0.00–0.40)

## 2019-07-12 LAB — COMPREHENSIVE METABOLIC PANEL
ALT: 27 IU/L (ref 0–32)
AST: 20 IU/L (ref 0–40)
Albumin/Globulin Ratio: 1.6 (ref 1.2–2.2)
Albumin: 4.1 g/dL (ref 3.6–4.6)
Alkaline Phosphatase: 94 IU/L (ref 39–117)
BUN/Creatinine Ratio: 15 (ref 12–28)
BUN: 16 mg/dL (ref 8–27)
Bilirubin Total: 0.3 mg/dL (ref 0.0–1.2)
CO2: 21 mmol/L (ref 20–29)
Calcium: 8.9 mg/dL (ref 8.7–10.3)
Chloride: 101 mmol/L (ref 96–106)
Creatinine, Ser: 1.08 mg/dL — ABNORMAL HIGH (ref 0.57–1.00)
GFR calc Af Amer: 54 mL/min/{1.73_m2} — ABNORMAL LOW (ref >59)
GFR calc non Af Amer: 47 mL/min/{1.73_m2} — ABNORMAL LOW (ref >59)
Globulin, Total: 2.5 g/dL (ref 1.5–4.5)
Glucose: 93 mg/dL (ref 65–99)
Potassium: 4.2 mmol/L (ref 3.5–5.2)
Sodium: 139 mmol/L (ref 134–144)
Total Protein: 6.6 g/dL (ref 6.0–8.5)

## 2019-07-12 LAB — LIPID PANEL
Chol/HDL Ratio: 3.8 ratio (ref 0.0–4.4)
Cholesterol, Total: 208 mg/dL — ABNORMAL HIGH (ref 100–199)
HDL: 55 mg/dL (ref >39)
LDL Chol Calc (NIH): 135 mg/dL — ABNORMAL HIGH (ref 0–99)
Triglycerides: 102 mg/dL (ref 0–149)
VLDL Cholesterol Cal: 18 mg/dL (ref 5–40)

## 2019-07-12 LAB — TSH: TSH: 4.48 u[IU]/mL (ref 0.450–4.500)

## 2019-07-21 DIAGNOSIS — H401131 Primary open-angle glaucoma, bilateral, mild stage: Secondary | ICD-10-CM | POA: Diagnosis not present

## 2019-07-24 ENCOUNTER — Other Ambulatory Visit: Payer: Self-pay | Admitting: Cardiovascular Disease

## 2019-08-02 DIAGNOSIS — H401131 Primary open-angle glaucoma, bilateral, mild stage: Secondary | ICD-10-CM | POA: Diagnosis not present

## 2019-08-26 ENCOUNTER — Other Ambulatory Visit: Payer: Self-pay | Admitting: Family Medicine

## 2019-08-29 ENCOUNTER — Other Ambulatory Visit: Payer: Self-pay | Admitting: Family Medicine

## 2019-09-05 ENCOUNTER — Other Ambulatory Visit: Payer: Self-pay | Admitting: Family Medicine

## 2019-09-05 NOTE — Telephone Encounter (Signed)
Requested medication (s) are due for refill today: yes  Requested medication (s) are on the active medication list: yes  Last refill:  07/19/2019  Future visit scheduled:no  Notes to clinic:  no valid encounter in last 12 months    Requested Prescriptions  Pending Prescriptions Disp Refills   gabapentin (NEURONTIN) 300 MG capsule [Pharmacy Med Name: GABAPENTIN 300 MG CAP] 90 capsule 12    Sig: TAKE 1 CAPSULE EVERY MORNING AND TAKE 2 CAPSULES AT BEDTIME      Neurology: Anticonvulsants - gabapentin Failed - 09/05/2019  1:43 PM      Failed - Valid encounter within last 12 months    Recent Outpatient Visits           1 year ago Annual physical exam   [facility] Birdie Sons, MD   2 years ago Fever, unspecified fever cause   [facility] Birdie Sons, MD   2 years ago Vitamin D deficiency   [facility] Birdie Sons, MD   2 years ago Chronic bronchitis, simple [facility])   [facility] Jerrol Banana., MD   3 years ago Cough   [facility] [place], Fabio Bering [place], [place]

## 2019-09-20 ENCOUNTER — Telehealth: Payer: Self-pay

## 2019-09-20 NOTE — Telephone Encounter (Signed)
Tried to contact pt to inquire about setting up a telephonic AWV and there was NANM. Will try again later.

## 2019-09-28 NOTE — Telephone Encounter (Signed)
Scheduled telephonic AWV for 10/06/19 @ 1:20 PM.

## 2019-10-05 NOTE — Progress Notes (Signed)
Subjective:   Molly Hodges is a 86 y.o. female who presents for Medicare Annual (Subsequent) preventive examination.    This visit is being conducted through telemedicine due to the COVID-19 pandemic. This patient has given me verbal consent via doximity to conduct this visit, patient states they are participating from their home address. Some vital signs may be absent or patient reported.    Patient identification: identified by name, DOB, and current address  Review of Systems:  N/A  Cardiac Risk Factors include: advanced age (>64men, >74 women);hypertension     Objective:     Vitals: There were no vitals taken for this visit.  There is no height or weight on file to calculate BMI. Unable to obtain vitals due to visit being conducted via telephonically.   Advanced Directives 10/06/2019 10/19/2017 11/05/2016 11/15/2015 09/17/2015 03/26/2015  Does Patient Have a Medical Advance Directive? Yes Yes Yes Yes No Yes  Type of Paramedic of [place];Living will [place];Living will [place];Living will - - Living will;Healthcare Power of [place] in Chart? Yes - validated most recent copy scanned in chart (See row information) Yes No - copy requested No - copy requested - No - copy requested  Would patient like information on creating a medical advance directive? - - - - No - patient declined information -    Tobacco Social History   Tobacco Use  Smoking Status Never Smoker  Smokeless Tobacco Never Used     Counseling given: Not Answered   Clinical Intake:  Pre-visit preparation completed: Yes  Pain : No/denies pain Pain Score: 0-No pain     Nutritional Risks: None Diabetes: No  How often do you need to have someone help you when you read instructions, pamphlets, or other written materials from your doctor or pharmacy?: 1 - Never  Interpreter Needed?: No  Information  entered by :: [facility], LPN  Past Medical History:  Diagnosis Date  . Asthma   . Community acquired pneumonia   . COPD (chronic obstructive pulmonary disease) ([place])   . Essential hypertension   . Glaucoma   . Mitral regurgitation    a. TTE 08/2015: EF 60-65%, normal wall motion, mild MR, mildly dilated left atrium measuring 40 mm, RVSF normal, PASP normal  . PAF (paroxysmal atrial fibrillation) ([place]) 09/17/2015   a. s/p DCCV 11/15/2015; CHADS2VASc => 4 (HTN, age x 2, female); c. on Eliquis   Past Surgical History:  Procedure Laterality Date  . ABDOMINAL HYSTERECTOMY  1987   due to heavy bleeding  . APPENDECTOMY    . CATARACT EXTRACTION    . ELECTROPHYSIOLOGIC STUDY N/A 11/15/2015   Procedure: CARDIOVERSION;  Surgeon: Minna Merritts, MD;  Location: [facility] ORS;  Service: Cardiovascular;  Laterality: N/A;  . TONSILLECTOMY     Family History  Problem Relation Age of Onset  . CAD Mother   . CAD Father   . Cancer Son 85       lung cancer   Social History   Socioeconomic History  . Marital status: Married    Spouse name: Not on file  . Number of children: 4  . Years of education: Not on file  . Highest education level: 12th grade  Occupational History  . Occupation: retired  Tobacco Use  . Smoking status: Never Smoker  . Smokeless tobacco: Never Used  Substance and Sexual Activity  . Alcohol use: No  . Drug use: Yes  Types: Nitrous oxide  . Sexual activity: Not on file  Other Topics Concern  . Not on file  Social History Narrative   Lives at home with Husband. Active and Independent at baseline.   Social Determinants of Health   Financial Resource Strain: Low Risk   . Difficulty of Paying Living Expenses: Not hard at all  Food Insecurity: No Food Insecurity  . Worried About Charity fundraiser in the Last Year: Never true  . Ran Out of Food in the Last Year: Never true  Transportation Needs: No Transportation Needs  . Lack of Transportation (Medical): No  .  Lack of Transportation (Non-Medical): No  Physical Activity: Inactive  . Days of Exercise per Week: 0 days  . Minutes of Exercise per Session: 0 min  Stress: No Stress Concern Present  . Feeling of Stress : Not at all  Social Connections: Slightly Isolated  . Frequency of Communication with Friends and Family: More than three times a week  . Frequency of Social Gatherings with Friends and Family: More than three times a week  . Attends Religious Services: More than 4 times per year  . Active Member of Clubs or Organizations: No  . Attends Archivist Meetings: Never  . Marital Status: Married    Outpatient Encounter Medications as of 10/06/2019  Medication Sig  . ADVAIR DISKUS 250-50 MCG/DOSE AEPB INHALE ONE (1) PUFF EVERY TWELVE HOURS; RINSE MOUTH AFTER USE  . amiodarone (PACERONE) 200 MG tablet TAKE 1 TABLET BY MOUTH DAILY  . apixaban (ELIQUIS) 5 MG TABS tablet Take 1 tablet (5 mg total) by mouth 2 (two) times daily.  . Calcium Carbonate-Vitamin D (CALCIUM 600+D) 600-400 MG-UNIT tablet Take 1 tablet by mouth daily.  . cholecalciferol (VITAMIN D) 1000 UNITS tablet Take 1,000 Units by mouth daily.  Marland Kitchen diltiazem (CARDIZEM CD) 180 MG 24 hr capsule TAKE 1 CAPSULE EVERY DAY  . dorzolamide (TRUSOPT) 2 % ophthalmic solution Place 1 drop into both eyes 2 (two) times daily.   Marland Kitchen gabapentin (NEURONTIN) 300 MG capsule TAKE 1 CAPSULE EVERY MORNING AND TAKE 2 CAPSULES AT BEDTIME  . hydrochlorothiazide (MICROZIDE) 12.5 MG capsule TAKE 1 CAPSULE BY MOUTH EVERY DAY  . latanoprost (XALATAN) 0.005 % ophthalmic solution Place 1 drop into both eyes at bedtime.   . Omega-3 Fatty Acids (FISH OIL BURP-LESS) 1000 MG CAPS Take 1,000 mg by mouth daily.   . potassium chloride (K-DUR) 10 MEQ tablet Take 1 tablet (10 mEq total) by mouth 2 (two) times daily as needed.  . vitamin C (ASCORBIC ACID) 500 MG tablet Take 1,000 mg by mouth daily.  . vitamin E 100 UNIT capsule Take 200 Units by mouth daily.  Marland Kitchen  diltiazem (TIAZAC) 180 MG 24 hr capsule TAKE 1 CAPSULE BY MOUTH EVERY DAY  . furosemide (LASIX) 20 MG tablet Take 1 tablet (20 mg total) by mouth daily as needed. (Patient not taking: Reported on 10/06/2019)  . magnesium oxide (MAG-OX) 400 MG tablet Take 400 mg by mouth daily.   No facility-administered encounter medications on file as of 10/06/2019.    Activities of Daily Living In your present state of health, do you have any difficulty performing the following activities: 10/06/2019  Hearing? N  Vision? N  Difficulty concentrating or making decisions? N  Walking or climbing stairs? N  Dressing or bathing? N  Doing errands, shopping? Y  Comment Does not drive.  Preparing Food and eating ? N  Using the Toilet? N  In  the past six months, have you accidently leaked urine? N  Do you have problems with loss of bowel control? N  Managing your Medications? N  Managing your Finances? N  Housekeeping or managing your Housekeeping? N  Some recent data might be hidden    Patient Care Team: Birdie Sons, MD as PCP - General (Family Medicine) Minna Merritts, MD as Consulting Physician (Cardiology) Leandrew Koyanagi, MD as Referring Physician (Ophthalmology)    Assessment:   This is a routine wellness examination for Darren.  Exercise Activities and Dietary recommendations Current Exercise Habits: The patient does not participate in regular exercise at present, Exercise limited by: None identified  Goals    . Exercise 3x per week (30 min per time)     Recommend to exercise for 3 days a week for at least 30 minutes at a time.        Fall Risk: Fall Risk  10/06/2019 10/19/2017 11/05/2016  Falls in the past year? 0 No No  Number falls in past yr: 0 - -  Injury with Fall? 0 - -    FALL RISK PREVENTION PERTAINING TO THE HOME:  Any stairs in or around the home? No  If so, are there any without handrails? N/A  Home free of loose throw rugs in walkways, pet beds, electrical  cords, etc? Yes  Adequate lighting in your home to reduce risk of falls? Yes   ASSISTIVE DEVICES UTILIZED TO PREVENT FALLS:  Life alert? No  Use of a cane, walker or w/c? No  Grab bars in the bathroom? Yes  Shower chair or bench in shower? Yes  Elevated toilet seat or a handicapped toilet? No    TIMED UP AND GO:  Was the test performed? No .    Depression Screen PHQ 2/9 Scores 10/06/2019 10/19/2017 11/05/2016 11/05/2016  PHQ - 2 Score 0 0 0 0  PHQ- 9 Score - - 0 -     Cognitive Function: Declined today.     6CIT Screen 11/05/2016  What Year? 0 points  What month? 0 points  What time? 0 points  Count back from 20 0 points  Months in reverse 4 points  Repeat phrase 0 points  Total Score 4    Immunization History  Administered Date(s) Administered  . Fluad Quad(high Dose 65+) 05/18/2019  . Influenza, High Dose Seasonal PF 08/28/2017, 05/29/2018  . Pneumococcal Conjugate-13 05/30/2014  . Pneumococcal Polysaccharide-23 06/09/1985, 06/15/2006  . Zoster Recombinat (Shingrix) 05/05/2018, 07/20/2018    Qualifies for Shingles Vaccine? Completed series.  Tdap: Although this vaccine is not a covered service during a Wellness Exam, does the patient still wish to receive this vaccine today?  No . Advised may receive this vaccine at local pharmacy or Health Dept. Aware to provide a copy of the vaccination record if obtained from local pharmacy or Health Dept. Verbalized acceptance and understanding.  Flu Vaccine: Up to date  Pneumococcal Vaccine: Completed series  Screening Tests Health Maintenance  Topic Date Due  . DEXA SCAN  10/05/2020 (Originally 1932-03-20)  . TETANUS/TDAP  08/18/2026 (Originally 10/20/1950)  . INFLUENZA VACCINE  Completed  . PNA vac Low Risk Adult  Completed    Cancer Screenings:  Colorectal Screening: No longer required.   Mammogram: No longer required.   Bone Density: Ordered 10/27/17 but not completed. Pt declined a future order for this.  Lung  Cancer Screening: (Low Dose CT Chest recommended if Age 52-80 years, 30 pack-year currently smoking OR have quit w/in  15years.) does not qualify.   Additional Screening:  Vision Screening: Recommended annual ophthalmology exams for early detection of glaucoma and other disorders of the eye.  Dental Screening: Recommended annual dental exams for proper oral hygiene  Community Resource Referral:  CRR required this visit?  No       Plan:  I have personally reviewed and addressed the Medicare Annual Wellness questionnaire and have noted the following in the patient's chart:  A. Medical and social history B. Use of alcohol, tobacco or illicit drugs  C. Current medications and supplements D. Functional ability and status E.  Nutritional status F.  Physical activity G. Advance directives H. List of other physicians I.  Hospitalizations, surgeries, and ER visits in previous 12 months J.  Lane such as hearing and vision if needed, cognitive and depression L. Referrals and appointments   In addition, I have reviewed and discussed with patient certain preventive protocols, quality metrics, and best practice recommendations. A written personalized care plan for preventive services as well as general preventive health recommendations were provided to patient. Nurse Health Advisor  Signed,    Anthony Roland [place], [place]  [id number] Nurse Health Advisor   Nurse Notes: Declined a future DEXA order at this time.

## 2019-10-06 ENCOUNTER — Ambulatory Visit (INDEPENDENT_AMBULATORY_CARE_PROVIDER_SITE_OTHER): Payer: Medicare Other

## 2019-10-06 ENCOUNTER — Other Ambulatory Visit: Payer: Self-pay

## 2019-10-06 DIAGNOSIS — Z0000 Encounter for general adult medical examination without abnormal findings: Secondary | ICD-10-CM

## 2019-10-06 NOTE — Patient Instructions (Signed)
[name] , Thank you for taking time to come for your Medicare Wellness Visit. I appreciate your ongoing commitment to your health goals. Please review the following plan we discussed and let me know if I can assist you in the future.   Screening recommendations/referrals: Colonoscopy: No longer required.  Mammogram: No longer required.  Bone Density: Currently due however declines order. Recommended yearly ophthalmology/optometry visit for glaucoma screening and checkup Recommended yearly dental visit for hygiene and checkup  Vaccinations: Influenza vaccine: Up to date Pneumococcal vaccine: Completed series Tdap vaccine: Pt declines today.  Shingles vaccine: Completed series    Advanced directives: Currently on file.  Conditions/risks identified: Recommend to exercise for 3 days a week for at least 30 minutes at a time.   Next appointment: None, declined scheduling a follow up with PCP or an AWV for 2022 at this time.    Preventive Care 16 Years and Older, Female Preventive care refers to lifestyle choices and visits with your health care provider that can promote health and wellness. What does preventive care include?  A yearly physical exam. This is also called an annual well check.  Dental exams once or twice a year.  Routine eye exams. Ask your health care provider how often you should have your eyes checked.  Personal lifestyle choices, including:  Daily care of your teeth and gums.  Regular physical activity.  Eating a healthy diet.  Avoiding tobacco and drug use.  Limiting alcohol use.  Practicing safe sex.  Taking low-dose aspirin every day.  Taking vitamin and mineral supplements as recommended by your health care provider. What happens during an annual well check? The services and screenings done by your health care provider during your annual well check will depend on your age, overall health, lifestyle risk factors, and family history of  disease. Counseling  Your health care provider may ask you questions about your:  Alcohol use.  Tobacco use.  Drug use.  Emotional well-being.  Home and relationship well-being.  Sexual activity.  Eating habits.  History of falls.  Memory and ability to understand (cognition).  Work and work Statistician.  Reproductive health. Screening  You may have the following tests or measurements:  Height, weight, and BMI.  Blood pressure.  Lipid and cholesterol levels. These may be checked every 5 years, or more frequently if you are over 50 years old.  Skin check.  Lung cancer screening. You may have this screening every year starting at age 5 if you have a 30-pack-year history of smoking and currently smoke or have quit within the past 15 years.  Fecal occult blood test (FOBT) of the stool. You may have this test every year starting at age 90.  Flexible sigmoidoscopy or colonoscopy. You may have a sigmoidoscopy every 5 years or a colonoscopy every 10 years starting at age 77.  Hepatitis C blood test.  Hepatitis B blood test.  Sexually transmitted disease (STD) testing.  Diabetes screening. This is done by checking your blood sugar (glucose) after you have not eaten for a while (fasting). You may have this done every 1-3 years.  Bone density scan. This is done to screen for osteoporosis. You may have this done starting at age 47.  Mammogram. This may be done every 1-2 years. Talk to your health care provider about how often you should have regular mammograms. Talk with your health care provider about your test results, treatment options, and if necessary, the need for more tests. Vaccines  Your health  care provider may recommend certain vaccines, such as:  Influenza vaccine. This is recommended every year.  Tetanus, diphtheria, and acellular pertussis (Tdap, Td) vaccine. You may need a Td booster every 10 years.  Zoster vaccine. You may need this after age  81.  Pneumococcal 13-valent conjugate (PCV13) vaccine. One dose is recommended after age 1.  Pneumococcal polysaccharide (PPSV23) vaccine. One dose is recommended after age 45. Talk to your health care provider about which screenings and vaccines you need and how often you need them. This information is not intended to replace advice given to you by your health care provider. Make sure you discuss any questions you have with your health care provider. Document Released: 08/31/2015 Document Revised: 04/23/2016 Document Reviewed: 06/05/2015 Elsevier Interactive Patient Education  2017 [place] Prevention in the Home Falls can cause injuries. They can happen to people of all ages. There are many things you can do to make your home safe and to help prevent falls. What can I do on the outside of my home?  Regularly fix the edges of walkways and driveways and fix any cracks.  Remove anything that might make you trip as you walk through a door, such as a raised step or threshold.  Trim any bushes or trees on the path to your home.  Use bright outdoor lighting.  Clear any walking paths of anything that might make someone trip, such as rocks or tools.  Regularly check to see if handrails are loose or broken. Make sure that both sides of any steps have handrails.  Any raised decks and porches should have guardrails on the edges.  Have any leaves, snow, or ice cleared regularly.  Use sand or salt on walking paths during winter.  Clean up any spills in your garage right away. This includes oil or grease spills. What can I do in the bathroom?  Use night lights.  Install grab bars by the toilet and in the tub and shower. Do not use towel bars as grab bars.  Use non-skid mats or decals in the tub or shower.  If you need to sit down in the shower, use a plastic, non-slip stool.  Keep the floor dry. Clean up any water that spills on the floor as soon as it happens.  Remove  soap buildup in the tub or shower regularly.  Attach bath mats securely with double-sided non-slip rug tape.  Do not have throw rugs and other things on the floor that can make you trip. What can I do in the bedroom?  Use night lights.  Make sure that you have a light by your bed that is easy to reach.  Do not use any sheets or blankets that are too big for your bed. They should not hang down onto the floor.  Have a firm chair that has side arms. You can use this for support while you get dressed.  Do not have throw rugs and other things on the floor that can make you trip. What can I do in the kitchen?  Clean up any spills right away.  Avoid walking on wet floors.  Keep items that you use a lot in easy-to-reach places.  If you need to reach something above you, use a strong step stool that has a grab bar.  Keep electrical cords out of the way.  Do not use floor polish or wax that makes floors slippery. If you must use wax, use non-skid floor wax.  Do not have  throw rugs and other things on the floor that can make you trip. What can I do with my stairs?  Do not leave any items on the stairs.  Make sure that there are handrails on both sides of the stairs and use them. Fix handrails that are broken or loose. Make sure that handrails are as long as the stairways.  Check any carpeting to make sure that it is firmly attached to the stairs. Fix any carpet that is loose or worn.  Avoid having throw rugs at the top or bottom of the stairs. If you do have throw rugs, attach them to the floor with carpet tape.  Make sure that you have a light switch at the top of the stairs and the bottom of the stairs. If you do not have them, ask someone to add them for you. What else can I do to help prevent falls?  Wear shoes that:  Do not have high heels.  Have rubber bottoms.  Are comfortable and fit you well.  Are closed at the toe. Do not wear sandals.  If you use a  stepladder:  Make sure that it is fully opened. Do not climb a closed stepladder.  Make sure that both sides of the stepladder are locked into place.  Ask someone to hold it for you, if possible.  Clearly mark and make sure that you can see:  Any grab bars or handrails.  First and last steps.  Where the edge of each step is.  Use tools that help you move around (mobility aids) if they are needed. These include:  Canes.  Walkers.  Scooters.  Crutches.  Turn on the lights when you go into a dark area. Replace any light bulbs as soon as they burn out.  Set up your furniture so you have a clear path. Avoid moving your furniture around.  If any of your floors are uneven, fix them.  If there are any pets around you, be aware of where they are.  Review your medicines with your doctor. Some medicines can make you feel dizzy. This can increase your chance of falling. Ask your doctor what other things that you can do to help prevent falls. This information is not intended to replace advice given to you by your health care provider. Make sure you discuss any questions you have with your health care provider. Document Released: 05/31/2009 Document Revised: 01/10/2016 Document Reviewed: 09/08/2014 Elsevier Interactive Patient Education  2017 [employer].

## 2019-10-14 ENCOUNTER — Other Ambulatory Visit: Payer: Self-pay | Admitting: Family Medicine

## 2019-10-14 NOTE — Telephone Encounter (Signed)
Requested medication (s) are due for refill today: yes  Requested medication (s) are on the active medication list: ywa  Last refill:  09/05/19  Future visit scheduled: no  Notes to clinic:  no valid encounter within last 12 months   Requested Prescriptions  Pending Prescriptions Disp Refills   gabapentin (NEURONTIN) 300 MG capsule [Pharmacy Med Name: GABAPENTIN 300 MG CAP] 90 capsule 1    Sig: TAKE 1 CAPSULE EVERY MORNING AND TAKE 2 CAPSULES AT BEDTIME      Neurology: Anticonvulsants - gabapentin Failed - 10/14/2019 10:17 AM      Failed - Valid encounter within last 12 months    Recent Outpatient Visits           1 year ago Annual physical exam   [facility] Birdie Sons, MD   2 years ago Fever, unspecified fever cause   [facility] Birdie Sons, MD   2 years ago Vitamin D deficiency   [facility] Birdie Sons, MD   3 years ago Chronic bronchitis, simple [facility])   [facility] Jerrol Banana., MD   3 years ago Cough   [facility] [place], Fabio Bering [place], [place]

## 2019-10-14 NOTE — Telephone Encounter (Signed)
Patient last seen by provider on 10/27/17 (has had visit with [facility] on 10/06/19), has no f/u OV scheduled

## 2019-11-29 NOTE — Progress Notes (Addendum)
I,Roshena L Chambers,acting as a scribe for Lelon Huh, MD.,have documented all relevant documentation on the behalf of Lelon Huh, MD,as directed by  Lelon Huh, MD while in the presence of Lelon Huh, MD.   Established patient visit     Patient: Molly Hodges   DOB: Sep 25, 1931   86 y.o. Female  MRN: [id number] Visit Date: 11/30/2019  Today's healthcare provider: Lelon Huh, MD  Subjective:    Chief Complaint  Patient presents with  . Hypertension   HPI Follow up for Vitamin D Deficiency:  The patient was last seen for this 10/27/2017. Changes made at last visit include none.  She reports good compliance with treatment. She feels that condition is Unchanged. She is not having side effects.   -----------------------------------------------------------------------------------  Hypertension, follow-up  BP Readings from Last 3 Encounters:  11/30/19 (!) 176/62  07/11/19 (!) 152/60  07/08/18 (!) 140/58   She was last seen for hypertension 10/27/2017.  BP at that visit was 160/60. Management since that visit includes no changes. She reports good compliance with treatment. She is not having side effects.  She is exercising. She is adherent to low salt diet.   Outside blood pressures are 120/60. Symptoms: No chest pain No chest pressure/discomfort No dyspnea No lower extremity edema No orthopnea No palpitations No paroxysmal nocturnal dyspnea No syncope  She does not smoke. She is following a Regular diet. Use of agents associated with hypertension: none.   Last metabolic panel Lab Results  Component Value Date   GLUCOSE 93 07/11/2019   NA 139 07/11/2019   K 4.2 07/11/2019   CL 101 07/11/2019   CO2 21 07/11/2019   BUN 16 07/11/2019   CREATININE 1.08 (H) 07/11/2019   GFRNONAA 47 (L) 07/11/2019   GFRAA 54 (L) 07/11/2019   CALCIUM 8.9 07/11/2019   PROT 6.6 07/11/2019   ALBUMIN 4.1 07/11/2019   LABGLOB 2.5 07/11/2019   AGRATIO 1.6  07/11/2019   BILITOT 0.3 07/11/2019   ALKPHOS 94 07/11/2019   AST 20 07/11/2019   ALT 27 07/11/2019   ANIONGAP 8 11/09/2015   Last lipids Lab Results  Component Value Date   CHOL 208 (H) 07/11/2019   HDL 55 07/11/2019   LDLCALC 135 (H) 07/11/2019   TRIG 102 07/11/2019   CHOLHDL 3.8 07/11/2019    The ASCVD Risk score (Goff DC Jr., et al., 2013) failed to calculate for the following reasons:   The 2013 ASCVD risk score is only valid for ages 91 to 18   ----------------------------------------------------------------------------------------------------------------------      Medications: Outpatient Medications Prior to Visit  Medication Sig  . amiodarone (PACERONE) 200 MG tablet TAKE 1 TABLET BY MOUTH DAILY  . apixaban (ELIQUIS) 5 MG TABS tablet Take 1 tablet (5 mg total) by mouth 2 (two) times daily.  . budesonide-formoterol (SYMBICORT) 160-4.5 MCG/ACT inhaler Inhale 2 puffs into the lungs 2 (two) times daily.  . Calcium Carbonate-Vitamin D (CALCIUM 600+D) 600-400 MG-UNIT tablet Take 1 tablet by mouth daily.  . cholecalciferol (VITAMIN D) 1000 UNITS tablet Take 1,000 Units by mouth daily.  Marland Kitchen diltiazem (CARDIZEM CD) 180 MG 24 hr capsule TAKE 1 CAPSULE EVERY DAY  . diltiazem (TIAZAC) 180 MG 24 hr capsule TAKE 1 CAPSULE BY MOUTH EVERY DAY  . dorzolamide (TRUSOPT) 2 % ophthalmic solution Place 1 drop into both eyes 2 (two) times daily.   Marland Kitchen gabapentin (NEURONTIN) 300 MG capsule TAKE 1 CAPSULE EVERY MORNING AND TAKE 2 CAPSULES AT BEDTIME  . hydrochlorothiazide (MICROZIDE)  12.5 MG capsule TAKE 1 CAPSULE BY MOUTH EVERY DAY  . latanoprost (XALATAN) 0.005 % ophthalmic solution Place 1 drop into both eyes at bedtime.   . magnesium oxide (MAG-OX) 400 MG tablet Take 400 mg by mouth daily.  . Omega-3 Fatty Acids (FISH OIL BURP-LESS) 1000 MG CAPS Take 1,000 mg by mouth daily.   . potassium chloride (K-DUR) 10 MEQ tablet Take 1 tablet (10 mEq total) by mouth 2 (two) times daily as needed.  .  vitamin C (ASCORBIC ACID) 500 MG tablet Take 1,000 mg by mouth daily.  . vitamin E 100 UNIT capsule Take 200 Units by mouth daily.  Marland Kitchen ADVAIR DISKUS 250-50 MCG/DOSE AEPB INHALE ONE (1) PUFF EVERY TWELVE HOURS; RINSE MOUTH AFTER USE (Patient not taking: Reported on 11/30/2019)  . furosemide (LASIX) 20 MG tablet Take 1 tablet (20 mg total) by mouth daily as needed. (Patient not taking: Reported on 11/30/2019)   No facility-administered medications prior to visit.    Review of Systems  Constitutional: Negative for appetite change, chills, fatigue and fever.  Respiratory: Negative for chest tightness and shortness of breath.   Cardiovascular: Negative for chest pain and palpitations.  Gastrointestinal: Negative for abdominal pain, nausea and vomiting.  Neurological: Negative for dizziness and weakness.         Objective:    BP (!) 176/62 (BP Location: Right Arm, Cuff Size: Normal)   Pulse 71   Temp (!) 96.8 F (36 C) (Temporal)   Resp 16   Wt 146 lb (66.2 kg)   SpO2 99% Comment: room air  BMI 24.30 kg/m     Physical Exam   General: Appearance:    Well developed, well nourished female in no acute distress  Eyes:    PERRL, conjunctiva/corneas clear, EOM's intact       Lungs:     Clear to auscultation bilaterally, respirations unlabored  Heart:    Normal heart rate. Irregularly irregular rhythm. No murmurs, rubs, or gallops.   MS:   All extremities are intact.   Neurologic:   Awake, alert, oriented x 3. No apparent focal neurological           defect.        No results found for any visits on 11/30/19.    Assessment & Plan:    1. Essential hypertension She reports home BP is much better than BP in office. She will work on avoiding sodium in diet.  Continue current medications.  Continue home BP monitoring and follow up in about 3 months.   - TSH - Renal Function Panel - CBC  2. Centrilobular emphysema ([place]) Per prior CT. Doing well with current inhalers.  refill -  budesonide-formoterol (SYMBICORT) 160-4.5 MCG/ACT inhaler; Inhale 2 puffs into the lungs 2 (two) times daily.  3. Paroxysmal atrial fibrillation (HCC) Doing well with current anticoagulation.   4. Chronic kidney disease, stage 3a Encouraged increased water consumption.  - Renal Function Panel  5. Vitamin D deficiency Compliant with current vitamin d regiment.  - VITAMIN D 25 Hydroxy (Vit-D Deficiency, Fractures)    The entirety of the information documented in the History of Present Illness, Review of Systems and Physical Exam were personally obtained by me. Portions of this information were initially documented by Meyer Cory, CMA and reviewed by me for thoroughness and accuracy.      Lelon Huh, MD  [facility] [phone] (phone) [phone] (fax)  [place]

## 2019-11-30 ENCOUNTER — Ambulatory Visit (INDEPENDENT_AMBULATORY_CARE_PROVIDER_SITE_OTHER): Payer: Medicare Other | Admitting: Family Medicine

## 2019-11-30 ENCOUNTER — Other Ambulatory Visit: Payer: Self-pay

## 2019-11-30 ENCOUNTER — Encounter: Payer: Self-pay | Admitting: Family Medicine

## 2019-11-30 VITALS — BP 176/62 | HR 71 | Temp 96.8°F | Resp 16 | Wt 146.0 lb

## 2019-11-30 DIAGNOSIS — N1831 Chronic kidney disease, stage 3a: Secondary | ICD-10-CM

## 2019-11-30 DIAGNOSIS — E559 Vitamin D deficiency, unspecified: Secondary | ICD-10-CM | POA: Diagnosis not present

## 2019-11-30 DIAGNOSIS — I480 Paroxysmal atrial fibrillation: Secondary | ICD-10-CM | POA: Diagnosis not present

## 2019-11-30 DIAGNOSIS — I10 Essential (primary) hypertension: Secondary | ICD-10-CM

## 2019-11-30 DIAGNOSIS — J432 Centrilobular emphysema: Secondary | ICD-10-CM | POA: Diagnosis not present

## 2019-11-30 NOTE — Patient Instructions (Signed)
.   Please review the attached list of medications and notify my office if there are any errors.   . Please bring all of your medications to every appointment so we can make sure that our medication list is the same as yours.   

## 2019-12-01 LAB — RENAL FUNCTION PANEL
Albumin: 4.2 g/dL (ref 3.6–4.6)
BUN/Creatinine Ratio: 18 (ref 12–28)
BUN: 16 mg/dL (ref 8–27)
CO2: 27 mmol/L (ref 20–29)
Calcium: 9.1 mg/dL (ref 8.7–10.3)
Chloride: 99 mmol/L (ref 96–106)
Creatinine, Ser: 0.9 mg/dL (ref 0.57–1.00)
GFR calc Af Amer: 67 mL/min/{1.73_m2} (ref >59)
GFR calc non Af Amer: 58 mL/min/{1.73_m2} — ABNORMAL LOW (ref >59)
Glucose: 93 mg/dL (ref 65–99)
Phosphorus: 3.7 mg/dL (ref 3.0–4.3)
Potassium: 4 mmol/L (ref 3.5–5.2)
Sodium: 139 mmol/L (ref 134–144)

## 2019-12-01 LAB — CBC
Hematocrit: 39.5 % (ref 34.0–46.6)
Hemoglobin: 12.6 g/dL (ref 11.1–15.9)
MCH: 25.7 pg — ABNORMAL LOW (ref 26.6–33.0)
MCHC: 31.9 g/dL (ref 31.5–35.7)
MCV: 81 fL (ref 79–97)
Platelets: 284 10*3/uL (ref 150–450)
RBC: 4.9 x10E6/uL (ref 3.77–5.28)
RDW: 14.8 % (ref 11.7–15.4)
WBC: 10 10*3/uL (ref 3.4–10.8)

## 2019-12-01 LAB — VITAMIN D 25 HYDROXY (VIT D DEFICIENCY, FRACTURES): Vit D, 25-Hydroxy: 44.8 ng/mL (ref 30.0–100.0)

## 2019-12-01 LAB — TSH: TSH: 5.81 u[IU]/mL — ABNORMAL HIGH (ref 0.450–4.500)

## 2020-01-13 ENCOUNTER — Other Ambulatory Visit: Payer: Self-pay | Admitting: Family Medicine

## 2020-01-31 DIAGNOSIS — H353132 Nonexudative age-related macular degeneration, bilateral, intermediate dry stage: Secondary | ICD-10-CM | POA: Diagnosis not present

## 2020-02-14 ENCOUNTER — Emergency Department: Payer: Medicare Other

## 2020-02-14 ENCOUNTER — Observation Stay
Admission: EM | Admit: 2020-02-14 | Discharge: 2020-02-15 | Disposition: A | Discharge status: Home / Self Care | Payer: Medicare Other | Attending: Internal Medicine | Admitting: Internal Medicine

## 2020-02-14 ENCOUNTER — Other Ambulatory Visit: Payer: Self-pay

## 2020-02-14 ENCOUNTER — Ambulatory Visit: Payer: Self-pay | Admitting: *Deleted

## 2020-02-14 ENCOUNTER — Encounter: Payer: Self-pay | Admitting: Emergency Medicine

## 2020-02-14 DIAGNOSIS — Z79899 Other long term (current) drug therapy: Secondary | ICD-10-CM | POA: Insufficient documentation

## 2020-02-14 DIAGNOSIS — J440 Chronic obstructive pulmonary disease with acute lower respiratory infection: Secondary | ICD-10-CM | POA: Diagnosis not present

## 2020-02-14 DIAGNOSIS — Z7901 Long term (current) use of anticoagulants: Secondary | ICD-10-CM | POA: Insufficient documentation

## 2020-02-14 DIAGNOSIS — J209 Acute bronchitis, unspecified: Secondary | ICD-10-CM | POA: Diagnosis present

## 2020-02-14 DIAGNOSIS — Z7951 Long term (current) use of inhaled steroids: Secondary | ICD-10-CM | POA: Insufficient documentation

## 2020-02-14 DIAGNOSIS — J441 Chronic obstructive pulmonary disease with (acute) exacerbation: Secondary | ICD-10-CM

## 2020-02-14 DIAGNOSIS — M8580 Other specified disorders of bone density and structure, unspecified site: Secondary | ICD-10-CM | POA: Insufficient documentation

## 2020-02-14 DIAGNOSIS — I480 Paroxysmal atrial fibrillation: Secondary | ICD-10-CM | POA: Diagnosis not present

## 2020-02-14 DIAGNOSIS — I10 Essential (primary) hypertension: Secondary | ICD-10-CM | POA: Diagnosis not present

## 2020-02-14 DIAGNOSIS — Z8249 Family history of ischemic heart disease and other diseases of the circulatory system: Secondary | ICD-10-CM | POA: Insufficient documentation

## 2020-02-14 DIAGNOSIS — H409 Unspecified glaucoma: Secondary | ICD-10-CM | POA: Insufficient documentation

## 2020-02-14 DIAGNOSIS — I4891 Unspecified atrial fibrillation: Secondary | ICD-10-CM | POA: Diagnosis not present

## 2020-02-14 DIAGNOSIS — Z20822 Contact with and (suspected) exposure to covid-19: Secondary | ICD-10-CM | POA: Diagnosis not present

## 2020-02-14 DIAGNOSIS — R079 Chest pain, unspecified: Secondary | ICD-10-CM | POA: Diagnosis not present

## 2020-02-14 DIAGNOSIS — R0602 Shortness of breath: Secondary | ICD-10-CM | POA: Diagnosis not present

## 2020-02-14 LAB — CBC
HCT: 39.5 % (ref 36.0–46.0)
Hemoglobin: 12.8 g/dL (ref 12.0–15.0)
MCH: 24.8 pg — ABNORMAL LOW (ref 26.0–34.0)
MCHC: 32.4 g/dL (ref 30.0–36.0)
MCV: 76.6 fL — ABNORMAL LOW (ref 80.0–100.0)
Platelets: 243 10*3/uL (ref 150–400)
RBC: 5.16 MIL/uL — ABNORMAL HIGH (ref 3.87–5.11)
RDW: 15.5 % (ref 11.5–15.5)
WBC: 9.9 10*3/uL (ref 4.0–10.5)
nRBC: 0 % (ref 0.0–0.2)

## 2020-02-14 LAB — BASIC METABOLIC PANEL
Anion gap: 11 (ref 5–15)
BUN: 20 mg/dL (ref 8–23)
CO2: 26 mmol/L (ref 22–32)
Calcium: 8.8 mg/dL — ABNORMAL LOW (ref 8.9–10.3)
Chloride: 100 mmol/L (ref 98–111)
Creatinine, Ser: 0.97 mg/dL (ref 0.44–1.00)
GFR calc Af Amer: >60 mL/min (ref >60)
GFR calc non Af Amer: 53 mL/min — ABNORMAL LOW (ref >60)
Glucose, Bld: 144 mg/dL — ABNORMAL HIGH (ref 70–99)
Potassium: 4.1 mmol/L (ref 3.5–5.1)
Sodium: 137 mmol/L (ref 135–145)

## 2020-02-14 LAB — TROPONIN I (HIGH SENSITIVITY)
Troponin I (High Sensitivity): 9 ng/L (ref <18)
Troponin I (High Sensitivity): 10 ng/L (ref <18)

## 2020-02-14 LAB — SARS CORONAVIRUS 2 BY RT PCR (HOSPITAL ORDER, PERFORMED IN ~~LOC~~ HOSPITAL LAB): SARS Coronavirus 2: NEGATIVE

## 2020-02-14 MED ORDER — MAGNESIUM OXIDE 400 (241.3 MG) MG PO TABS
400.0000 mg | ORAL_TABLET | Freq: Every day | ORAL | Status: DC
  Administered 2020-02-15: 400 mg via ORAL
  Filled 2020-02-14: qty 1

## 2020-02-14 MED ORDER — HYDROCHLOROTHIAZIDE 12.5 MG PO CAPS
12.5000 mg | ORAL_CAPSULE | Freq: Every day | ORAL | Status: DC
  Administered 2020-02-15: 12.5 mg via ORAL
  Filled 2020-02-14: qty 1

## 2020-02-14 MED ORDER — VITAMIN E 45 MG (100 UNIT) PO CAPS
200.0000 [IU] | ORAL_CAPSULE | Freq: Every day | ORAL | Status: DC
  Administered 2020-02-15: 200 [IU] via ORAL
  Filled 2020-02-14: qty 2

## 2020-02-14 MED ORDER — VITAMIN D 25 MCG (1000 UNIT) PO TABS
1000.0000 [IU] | ORAL_TABLET | Freq: Every day | ORAL | Status: DC
  Administered 2020-02-15: 1000 [IU] via ORAL
  Filled 2020-02-14: qty 1

## 2020-02-14 MED ORDER — MOMETASONE FURO-FORMOTEROL FUM 200-5 MCG/ACT IN AERO
2.0000 | INHALATION_SPRAY | Freq: Two times a day (BID) | RESPIRATORY_TRACT | Status: DC
  Administered 2020-02-14 – 2020-02-15 (×2): 2 via RESPIRATORY_TRACT
  Filled 2020-02-14: qty 8.8

## 2020-02-14 MED ORDER — ASCORBIC ACID 500 MG PO TABS
1000.0000 mg | ORAL_TABLET | Freq: Every day | ORAL | Status: DC
  Administered 2020-02-15: 1000 mg via ORAL
  Filled 2020-02-14: qty 2

## 2020-02-14 MED ORDER — LEVALBUTEROL HCL 0.63 MG/3ML IN NEBU
0.6300 mg | INHALATION_SOLUTION | Freq: Four times a day (QID) | RESPIRATORY_TRACT | Status: DC | PRN
  Administered 2020-02-14: 0.63 mg via RESPIRATORY_TRACT
  Filled 2020-02-14 (×2): qty 3

## 2020-02-14 MED ORDER — AMIODARONE HCL 200 MG PO TABS
200.0000 mg | ORAL_TABLET | Freq: Every day | ORAL | Status: DC
  Administered 2020-02-15: 200 mg via ORAL
  Filled 2020-02-14: qty 1

## 2020-02-14 MED ORDER — ACETAMINOPHEN 325 MG PO TABS
650.0000 mg | ORAL_TABLET | Freq: Four times a day (QID) | ORAL | Status: DC | PRN
  Administered 2020-02-14: 650 mg via ORAL
  Filled 2020-02-14: qty 2

## 2020-02-14 MED ORDER — DILTIAZEM HCL ER COATED BEADS 180 MG PO CP24
180.0000 mg | ORAL_CAPSULE | Freq: Every day | ORAL | Status: DC
  Administered 2020-02-14 – 2020-02-15 (×2): 180 mg via ORAL
  Filled 2020-02-14 (×3): qty 1

## 2020-02-14 MED ORDER — CALCIUM CARBONATE-VITAMIN D 500-200 MG-UNIT PO TABS
1.0000 | ORAL_TABLET | Freq: Every day | ORAL | Status: DC
  Administered 2020-02-15: 1 via ORAL
  Filled 2020-02-14: qty 1

## 2020-02-14 MED ORDER — IPRATROPIUM-ALBUTEROL 0.5-2.5 (3) MG/3ML IN SOLN
3.0000 mL | Freq: Once | RESPIRATORY_TRACT | Status: AC
  Administered 2020-02-14: 3 mL via RESPIRATORY_TRACT
  Filled 2020-02-14: qty 3

## 2020-02-14 MED ORDER — METHYLPREDNISOLONE SODIUM SUCC 125 MG IJ SOLR
125.0000 mg | Freq: Once | INTRAMUSCULAR | Status: AC
  Administered 2020-02-14: 125 mg via INTRAVENOUS
  Filled 2020-02-14: qty 2

## 2020-02-14 MED ORDER — SODIUM CHLORIDE 0.9% FLUSH: 3.0000 mL | INTRAVENOUS | Status: DC | PRN

## 2020-02-14 MED ORDER — DORZOLAMIDE HCL 2 % OP SOLN
1.0000 [drp] | Freq: Two times a day (BID) | OPHTHALMIC | Status: DC
  Administered 2020-02-14 – 2020-02-15 (×2): 1 [drp] via OPHTHALMIC
  Filled 2020-02-14: qty 10

## 2020-02-14 MED ORDER — FISH OIL BURP-LESS 1000 MG PO CAPS: 1000.0000 mg | ORAL_CAPSULE | Freq: Every day | ORAL | Status: DC

## 2020-02-14 MED ORDER — PREDNISONE 20 MG PO TABS
40.0000 mg | ORAL_TABLET | Freq: Every day | ORAL | Status: DC
  Administered 2020-02-15: 40 mg via ORAL
  Filled 2020-02-14: qty 2

## 2020-02-14 MED ORDER — APIXABAN 5 MG PO TABS
5.0000 mg | ORAL_TABLET | Freq: Two times a day (BID) | ORAL | Status: DC
  Administered 2020-02-14 – 2020-02-15 (×2): 5 mg via ORAL
  Filled 2020-02-14 (×2): qty 1

## 2020-02-14 MED ORDER — POTASSIUM CHLORIDE CRYS ER 10 MEQ PO TBCR
10.0000 meq | EXTENDED_RELEASE_TABLET | Freq: Every day | ORAL | Status: DC
  Administered 2020-02-15: 09:00:00 10 meq via ORAL
  Filled 2020-02-14: qty 1

## 2020-02-14 MED ORDER — GABAPENTIN 300 MG PO CAPS
300.0000 mg | ORAL_CAPSULE | Freq: Two times a day (BID) | ORAL | Status: DC
  Administered 2020-02-14: 21:00:00 300 mg via ORAL
  Filled 2020-02-14 (×2): qty 1

## 2020-02-14 MED ORDER — SODIUM CHLORIDE 0.9 % IV SOLN: 250.0000 mL | INTRAVENOUS | Status: DC | PRN

## 2020-02-14 MED ORDER — SODIUM CHLORIDE 0.9% FLUSH
3.0000 mL | Freq: Two times a day (BID) | INTRAVENOUS | Status: DC
  Administered 2020-02-14 – 2020-02-15 (×2): 3 mL via INTRAVENOUS

## 2020-02-14 MED ORDER — LATANOPROST 0.005 % OP SOLN
1.0000 [drp] | Freq: Every day | OPHTHALMIC | Status: DC
  Administered 2020-02-14: 21:00:00 1 [drp] via OPHTHALMIC
  Filled 2020-02-14: qty 2.5

## 2020-02-14 NOTE — ED Notes (Signed)
Attempted IV twice with no success. Blood obtained for 2nd trop. Will inform MD

## 2020-02-14 NOTE — ED Notes (Signed)
Admitting provider at bedside.

## 2020-02-14 NOTE — ED Notes (Signed)
Pt tolerated ambulation well, ambulated 8 times around room. O2 sats remained in mid to upper 90's, HR maxed out at 90. Upon completion pt had cough once back in bed, pt stated "it's due to the congestion I have been having".

## 2020-02-14 NOTE — H&P (Signed)
History and Physical    Molly Hodges:[id number] DOB: 02-15-1932 DOA: 02/14/2020  PCP: Molly Sons, MD   Patient coming from: Home  I have personally briefly reviewed patient's old medical records in [place]  Chief Complaint: Shortness of breath  HPI: Molly Hodges is a 86 y.o. female with medical history significant for asthma, COPD, paroxysmal A. fib, hypertension, glaucoma who presents to the emergency room for evaluation of chest tightness and shortness of breath which started acutely overnight.  Patient also complains of congestion and a cough which is nonproductive.  She denies having any fever or chills.  She denies having any chest pain, nausea, vomiting, diaphoresis or palpitations. Chest x-ray shows stable cardiac enlargement with pleuroparenchymal scarring and hyperinflation. Twelve-lead EKG shows atrial fibrillation  ED Course: Patient is an 86 year old Caucasian female with a history of asthma/COPD who presents to the emergency room for evaluation of shortness of breath, chest tightness, congestion and a nonproductive cough.  Patient symptoms improved in the ER following administration of bronchodilator therapy.  Patient does not have a rescue inhaler or nebulizer at home.  She was ambulated in the ER to assess for discharge but she became short of breath and tachycardic.  She will be referred to observation status for further evaluation. Patient received systemic steroids and IV magnesium in the ER.  Review of Systems: As per HPI otherwise 10 point review of systems negative.    Past Medical History:  Diagnosis Date  . Asthma   . Community acquired pneumonia   . COPD (chronic obstructive pulmonary disease) ([place])   . Essential hypertension   . Glaucoma   . Mitral regurgitation    a. TTE 08/2015: EF 60-65%, normal wall motion, mild MR, mildly dilated left atrium measuring 40 mm, RVSF normal, PASP normal  . PAF (paroxysmal atrial fibrillation) (Molly Hodges)  09/17/2015   a. s/p DCCV 11/15/2015; CHADS2VASc => 4 (HTN, age x 2, female); c. on Eliquis    Past Surgical History:  Procedure Laterality Date  . ABDOMINAL HYSTERECTOMY  1987   due to heavy bleeding  . APPENDECTOMY    . CATARACT EXTRACTION    . ELECTROPHYSIOLOGIC STUDY N/A 11/15/2015   Procedure: CARDIOVERSION;  Surgeon: Molly Merritts, MD;  Location: [facility] ORS;  Service: Cardiovascular;  Laterality: N/A;  . TONSILLECTOMY       reports that she has never smoked. She has never used smokeless tobacco. She reports current drug use. Drug: Nitrous oxide. She reports that she does not drink alcohol.  Allergies  Allergen Reactions  . Shellfish Allergy Anaphylaxis  . Albuterol     Heart racing.   . Azithromycin Other (See Comments)    Extreme burning sensation at IV site  . Tamiflu [Oseltamivir Phosphate] Other (See Comments)    Reaction:  Hallucinations  Reaction:  Hallucinations     Family History  Problem Relation Age of Onset  . CAD Mother   . CAD Father   . Cancer Son 20       lung cancer     Prior to Admission medications   Medication Sig Start Date End Date Taking? Authorizing Provider  ADVAIR DISKUS 250-50 MCG/DOSE AEPB INHALE ONE (1) PUFF EVERY TWELVE HOURS; RINSE MOUTH AFTER USE Patient not taking: Reported on 11/30/2019 01/15/16   Molly Sons, MD  amiodarone (PACERONE) 200 MG tablet TAKE 1 TABLET BY MOUTH DAILY 07/25/19   Molly Merritts, MD  apixaban (ELIQUIS) 5 MG TABS tablet Take 1 tablet (5  mg total) by mouth 2 (two) times daily. 07/11/19   Molly Merritts, MD  budesonide-formoterol (SYMBICORT) 160-4.5 MCG/ACT inhaler Inhale 2 puffs into the lungs 2 (two) times daily.    [provider]  Calcium Carbonate-Vitamin D (CALCIUM 600+D) 600-400 MG-UNIT tablet Take 1 tablet by mouth daily.    [provider]  cholecalciferol (VITAMIN D) 1000 UNITS tablet Take 1,000 Units by mouth daily.    [provider]  diltiazem (CARDIZEM CD) 180 MG  24 hr capsule TAKE 1 CAPSULE EVERY DAY 08/26/19   Molly Sons, MD  diltiazem (TIAZAC) 180 MG 24 hr capsule TAKE 1 CAPSULE BY MOUTH EVERY DAY 03/10/16   [provider]  dorzolamide (TRUSOPT) 2 % ophthalmic solution Place 1 drop into both eyes 2 (two) times daily.     [provider]  furosemide (LASIX) 20 MG tablet Take 1 tablet (20 mg total) by mouth daily as needed. Patient not taking: Reported on 11/30/2019 05/29/16 11/30/19  Molly Merritts, MD  gabapentin (NEURONTIN) 300 MG capsule TAKE 1 CAPSULE EVERY MORNING AND TAKE 2 CAPSULES AT BEDTIME 01/13/20   Molly Sons, MD  hydrochlorothiazide (MICROZIDE) 12.5 MG capsule TAKE 1 CAPSULE BY MOUTH EVERY DAY 05/24/19   Molly Sons, MD  latanoprost (XALATAN) 0.005 % ophthalmic solution Place 1 drop into both eyes at bedtime.     [provider]  magnesium oxide (MAG-OX) 400 MG tablet Take 400 mg by mouth daily.    [provider]  Omega-3 Fatty Acids (FISH OIL BURP-LESS) 1000 MG CAPS Take 1,000 mg by mouth daily.     [provider]  potassium chloride (K-DUR) 10 MEQ tablet Take 1 tablet (10 mEq total) by mouth 2 (two) times daily as needed. 05/29/16   Molly Merritts, MD  vitamin C (ASCORBIC ACID) 500 MG tablet Take 1,000 mg by mouth daily.    [provider]  vitamin E 100 UNIT capsule Take 200 Units by mouth daily.    [provider]    Physical Exam: Vitals:   02/14/20 1230 02/14/20 1300 02/14/20 1330 02/14/20 1400  BP: (!) 162/55 (!) 154/64 (!) 154/46 (!) 137/55  Pulse: 74 73 80 77  Resp: 15 19 17  (!) 23  Temp:      TempSrc:      SpO2: 100% 96% 99% 97%  Weight:      Height:         Vitals:   02/14/20 1230 02/14/20 1300 02/14/20 1330 02/14/20 1400  BP: (!) 162/55 (!) 154/64 (!) 154/46 (!) 137/55  Pulse: 74 73 80 77  Resp: 15 19 17  (!) 23  Temp:      TempSrc:      SpO2: 100% 96% 99% 97%  Weight:      Height:        Constitutional: NAD, alert and oriented  x 3 Eyes: PERRL, lids and conjunctivae normal ENMT: Mucous membranes are moist.  Neck: normal, supple, no masses, no thyromegaly Respiratory: Diffuse wheezing, no crackles. Normal respiratory effort. No accessory muscle use. Cardiovascular: Regular rate and rhythm, no murmurs / rubs / gallops. No extremity edema. 2+ pedal pulses. No carotid bruits.  Abdomen: no tenderness, no masses palpated. No hepatosplenomegaly. Bowel sounds positive.  Musculoskeletal: no clubbing / cyanosis. No joint deformity upper and lower extremities.  Skin: no rashes, lesions, ulcers.  Neurologic: No gross focal neurologic deficit. Psychiatric: Normal mood and affect.   Labs on Admission: I have personally reviewed following  labs and imaging studies  CBC: Recent Labs  Lab 02/14/20 0912  WBC 9.9  HGB 12.8  HCT 39.5  MCV 76.6*  PLT 063   Basic Metabolic Panel: Recent Labs  Lab 02/14/20 0912  NA 137  K 4.1  CL 100  CO2 26  GLUCOSE 144*  BUN 20  CREATININE 0.97  CALCIUM 8.8*   GFR: Estimated Creatinine Clearance: 37.5 mL/min (by C-G formula based on SCr of 0.97 mg/dL). Liver Function Tests: No results for input(s): AST, ALT, ALKPHOS, BILITOT, PROT, ALBUMIN in the last 168 hours. No results for input(s): LIPASE, AMYLASE in the last 168 hours. No results for input(s): AMMONIA in the last 168 hours. Coagulation Profile: No results for input(s): INR, PROTIME in the last 168 hours. Cardiac Enzymes: No results for input(s): CKTOTAL, CKMB, CKMBINDEX, TROPONINI in the last 168 hours. BNP (last 3 results) No results for input(s): PROBNP in the last 8760 hours. HbA1C: No results for input(s): HGBA1C in the last 72 hours. CBG: No results for input(s): GLUCAP in the last 168 hours. Lipid Profile: No results for input(s): CHOL, HDL, LDLCALC, TRIG, CHOLHDL, LDLDIRECT in the last 72 hours. Thyroid Function Tests: No results for input(s): TSH, T4TOTAL, FREET4, T3FREE, THYROIDAB in the last 72  hours. Anemia Panel: No results for input(s): VITAMINB12, FOLATE, FERRITIN, TIBC, IRON, RETICCTPCT in the last 72 hours. Urine analysis:    Component Value Date/Time   COLORURINE AMBER (A) 09/17/2015 1841   APPEARANCEUR HAZY (A) 09/17/2015 1841   LABSPEC 1.029 09/17/2015 1841   PHURINE 5.0 09/17/2015 1841   GLUCOSEU NEGATIVE 09/17/2015 1841   HGBUR 2+ (A) 09/17/2015 1841   BILIRUBINUR NEGATIVE 09/17/2015 [place] NEGATIVE 09/17/2015 1841   PROTEINUR 100 (A) 09/17/2015 1841   NITRITE NEGATIVE 09/17/2015 1841   LEUKOCYTESUR 1+ (A) 09/17/2015 1841    Radiological Exams on Admission: DG Chest 2 View  Result Date: 02/14/2020 CLINICAL DATA:  Shortness of breath chest pain EXAM: CHEST - 2 VIEW COMPARISON:  08/21/2016 and CT of September 21, 2015 FINDINGS: Stable cardiac enlargement.  Hilar structures are normal. Apical scarring worse on the LEFT. No lobar consolidation. Signs of scarring at the bilateral lung bases likely in RIGHT lower lobe and lingula based on previous imaging. Mild hyperinflation. No pleural effusion. Osteopenia and spinal degenerative change. IMPRESSION: Stable cardiac enlargement with pleuroparenchymal scarring and hyperinflation. Electronically Signed   By: Zetta Bills M.D.   On: 02/14/2020 09:22    EKG: Independently reviewed.  Atrial fibrillation  Assessment/Plan Principal Problem:   COPD with acute bronchitis (HCC) Active Problems:   Hypertension   Paroxysmal atrial fibrillation (HCC)    COPD with acute bronchitis Patient presents to the emergency room for evaluation of shortness of breath associated with chest congestion, nonproductive cough and wheezing Place patient on as needed bronchodilator therapy, continue inhaled steroids No need for antibiotics at this time Patient will need a prescription for a rescue inhaler/nebulizer upon discharge   Paroxysmal atrial fibrillation Continue amiodarone and diltiazem for rate control Continue apixaban  as primary prophylaxis for an acute stroke   Hypertension Blood pressure stable on hydrochlorothiazide and diltiazem         DVT prophylaxis: Apixaban Code Status: Full code Family Communication: Greater than 50% of time was spent discussing plan of care with the patient and her daughter at the bedside.  All questions and concerns have been addressed.  They verbalized understanding and agree with the plan. Disposition Plan: Back to previous home environment Consults  called: None    Keziah Drotar MD [facility]     02/14/2020, 3:54 PM

## 2020-02-14 NOTE — ED Notes (Addendum)
Ambulated pt from room 9 to charge desk on A side. Pt tolerated well O2 sats stayed in mid to upper 90's for the duration. HR maxed at 100. Increased WOB noted as pt returned to bed. Pt stated this was her baseline r/t her asthma.

## 2020-02-14 NOTE — ED Triage Notes (Signed)
Pt reports had some congestion and then last pm her chest started hurting. Pt states that pain is tight in nature and has some SOB. Pt reports has hx of asthma as well. Pt called MD who advised her to come to the ED.

## 2020-02-14 NOTE — ED Notes (Signed)
Provided pt w/ graham crackers and H2O

## 2020-02-14 NOTE — Telephone Encounter (Signed)
Pt's husband Molly Hodges called in then gave the phone to his wife (pt) because "I'm hard of hearing". Molly Hodges got on the phone and I could hear she was moderately short of breath.   See triage notes for details.  I have referred her to the ED per the protocol.  Her granddaughter that lives next door is going to take her to [facility]. I instructed her to call 911 if the shortness of breath becomes worse or the chest discomfort becomes worse or the "closing up" feeling gets worse before she gets to the ED via her granddaughter.   She verbalized understanding and was agreeable to this plan.  I let her know I would pass this information on to Dr. Caryn Section.  I sent my notes to [facility] for Dr. Caryn Section.  Reason for Disposition . [1] MODERATE difficulty breathing (e.g., speaks in phrases, SOB even at rest, pulse 100-120) AND [2] NEW-onset or WORSE than normal    Short of breath, chest tightness  Answer Assessment - Initial Assessment Questions 1. RESPIRATORY STATUS: "Describe your breathing?" (e.g., wheezing, shortness of breath, unable to speak, severe coughing)      I have asthma.   I had congestion Monday.   I took Mucinix and felt better.  Then I started closing up 12:00 last night.  I can't hardly breath this morning.  I had this 6-7 years ago ended up in the hospital with complications. 2. ONSET: "When did this breathing problem begin?"      Last night.  This morning I'm having trouble getting my breath. I'm having tightness in my chest and I'm very short of breathing and wheezing.   I have a headache.  My temp is 97.7.  I was coughing up stuff yesterday.  It was loose and clear but now I can't cough up anything this morning. 3. PATTERN "Does the difficult breathing come and go, or has it been constant since it started?"      Constant and worse this morning with chest tightness and wheezing. 4. SEVERITY: "How bad is your breathing?" (e.g., mild, moderate,  severe)    - MILD: No SOB at rest, mild SOB with walking, speaks normally in sentences, can lay down, no retractions, pulse < 100.    - MODERATE: SOB at rest, SOB with minimal exertion and prefers to sit, cannot lie down flat, speaks in phrases, mild retractions, audible wheezing, pulse 100-120.    - SEVERE: Very SOB at rest, speaks in single words, struggling to breathe, sitting hunched forward, retractions, pulse > 120      Moderate  (I can hear she is short of breath while talking with her). 5. RECURRENT SYMPTOM: "Have you had difficulty breathing before?" If Yes, ask: "When was the last time?" and "What happened that time?"      Yes 6-7 years ago and ended up in the hospital. 6. CARDIAC HISTORY: "Do you have any history of heart disease?" (e.g., heart attack, angina, bypass surgery, angioplasty)      Not asked.   Instructed to go on to the ED at this point. 7. LUNG HISTORY: "Do you have any history of lung disease?"  (e.g., pulmonary embolus, asthma, emphysema)     Asthma. 8. CAUSE: "What do you think is causing the breathing problem?"      I had this 6-7 years ago and ended up in the hospital.   I'm wanting to avoid that so I called y'all. 9. OTHER SYMPTOMS: "Do you  have any other symptoms? (e.g., dizziness, runny nose, cough, chest pain, fever)     See above,   Short of breath, chest tightness, wheezing feels like "she is closing up".  Unable to cough up anything now. 10. PREGNANCY: "Is there any chance you are pregnant?" "When was your last menstrual period?"       N/A due to age 86. TRAVEL: "Have you traveled out of the country in the last month?" (e.g., travel history, exposures)       Not asked  Protocols used: BREATHING DIFFICULTY-A-AH

## 2020-02-14 NOTE — ED Notes (Signed)
Updated Granddaughter of pt's room assignment.

## 2020-02-14 NOTE — ED Provider Notes (Signed)
[facility] Emergency Department Provider Note    First MD Initiated Contact with Patient 02/14/20 1020     (approximate)  I have reviewed the triage vital signs and the nursing notes.   HISTORY  Chief Complaint Chest Pain    HPI Molly Hodges is a 86 y.o. female history of COPD on Symbicort at home presents to the ER for 24 hours of worsening chest tightness shortness of breath and wheezing.  Is not been on any recent antibiotics.  Felt she was having some congestion yesterday she she took a Mucinex.  Does not have a rescue inhaler because she feels like her heart starts racing when she takes albuterol.  She has not been on any recent antibiotics or steroids.  Denies any nausea or vomiting.  Does not wear home oxygen.    Past Medical History:  Diagnosis Date  . Asthma   . Community acquired pneumonia   . COPD (chronic obstructive pulmonary disease) ([place])   . Essential hypertension   . Glaucoma   . Mitral regurgitation    a. TTE 08/2015: EF 60-65%, normal wall motion, mild MR, mildly dilated left atrium measuring 40 mm, RVSF normal, PASP normal  . PAF (paroxysmal atrial fibrillation) ([place]) 09/17/2015   a. s/p DCCV 11/15/2015; CHADS2VASc => 4 (HTN, age x 2, female); c. on Eliquis   Family History  Problem Relation Age of Onset  . CAD Mother   . CAD Father   . Cancer Son 12       lung cancer   Past Surgical History:  Procedure Laterality Date  . ABDOMINAL HYSTERECTOMY  1987   due to heavy bleeding  . APPENDECTOMY    . CATARACT EXTRACTION    . ELECTROPHYSIOLOGIC STUDY N/A 11/15/2015   Procedure: CARDIOVERSION;  Surgeon: Minna Merritts, MD;  Location: [facility] ORS;  Service: Cardiovascular;  Laterality: N/A;  . TONSILLECTOMY     Patient Active Problem List   Diagnosis Date Noted  . Long-term use of high-risk medication 11/11/2016  . COPD (chronic obstructive pulmonary disease) ([place]) 11/11/2016  . Lower extremity edema 05/29/2016  . Acute  diastolic CHF (congestive heart failure) ([place]) 05/29/2016  . Paroxysmal atrial fibrillation ([facility])   . Elevated troponin   . Hypoxia   . Encounter for anticoagulation discussion and counseling   . Premature ventricular contraction 03/26/2015  . Vitamin D deficiency 03/26/2015  . Peripheral neuropathy 03/26/2015  . Hypertension 03/26/2015  . Asthma 03/26/2015      Prior to Admission medications   Medication Sig Start Date End Date Taking? Authorizing Provider  ADVAIR DISKUS 250-50 MCG/DOSE AEPB INHALE ONE (1) PUFF EVERY TWELVE HOURS; RINSE MOUTH AFTER USE Patient not taking: Reported on 11/30/2019 01/15/16   Birdie Sons, MD  amiodarone (PACERONE) 200 MG tablet TAKE 1 TABLET BY MOUTH DAILY 07/25/19   Minna Merritts, MD  apixaban (ELIQUIS) 5 MG TABS tablet Take 1 tablet (5 mg total) by mouth 2 (two) times daily. 07/11/19   Minna Merritts, MD  budesonide-formoterol (SYMBICORT) 160-4.5 MCG/ACT inhaler Inhale 2 puffs into the lungs 2 (two) times daily.    [provider]  Calcium Carbonate-Vitamin D (CALCIUM 600+D) 600-400 MG-UNIT tablet Take 1 tablet by mouth daily.    [provider]  cholecalciferol (VITAMIN D) 1000 UNITS tablet Take 1,000 Units by mouth daily.    [provider]  diltiazem (CARDIZEM CD) 180 MG 24 hr capsule TAKE 1 CAPSULE EVERY DAY 08/26/19   Lelon Huh  E, MD  diltiazem (TIAZAC) 180 MG 24 hr capsule TAKE 1 CAPSULE BY MOUTH EVERY DAY 03/10/16   [provider]  dorzolamide (TRUSOPT) 2 % ophthalmic solution Place 1 drop into both eyes 2 (two) times daily.     [provider]  furosemide (LASIX) 20 MG tablet Take 1 tablet (20 mg total) by mouth daily as needed. Patient not taking: Reported on 11/30/2019 05/29/16 11/30/19  Minna Merritts, MD  gabapentin (NEURONTIN) 300 MG capsule TAKE 1 CAPSULE EVERY MORNING AND TAKE 2 CAPSULES AT BEDTIME 01/13/20   Birdie Sons, MD  hydrochlorothiazide (MICROZIDE) 12.5 MG capsule TAKE 1  CAPSULE BY MOUTH EVERY DAY 05/24/19   Birdie Sons, MD  latanoprost (XALATAN) 0.005 % ophthalmic solution Place 1 drop into both eyes at bedtime.     [provider]  magnesium oxide (MAG-OX) 400 MG tablet Take 400 mg by mouth daily.    [provider]  Omega-3 Fatty Acids (FISH OIL BURP-LESS) 1000 MG CAPS Take 1,000 mg by mouth daily.     [provider]  potassium chloride (K-DUR) 10 MEQ tablet Take 1 tablet (10 mEq total) by mouth 2 (two) times daily as needed. 05/29/16   Minna Merritts, MD  vitamin C (ASCORBIC ACID) 500 MG tablet Take 1,000 mg by mouth daily.    [provider]  vitamin E 100 UNIT capsule Take 200 Units by mouth daily.    [provider]    Allergies Shellfish allergy, Albuterol, Azithromycin, and Tamiflu [oseltamivir phosphate]    Social History Social History   Tobacco Use  . Smoking status: Never Smoker  . Smokeless tobacco: Never Used  Vaping Use  . Vaping Use: Never used  Substance Use Topics  . Alcohol use: No  . Drug use: Yes    Types: Nitrous oxide    Review of Systems Patient denies headaches, rhinorrhea, blurry vision, numbness, shortness of breath, chest pain, edema, cough, abdominal pain, nausea, vomiting, diarrhea, dysuria, fevers, rashes or hallucinations unless otherwise stated above in HPI. ____________________________________________   PHYSICAL EXAM:  VITAL SIGNS: Vitals:   02/14/20 1330 02/14/20 1400  BP: (!) 154/46 (!) 137/55  Pulse: 80 77  Resp: 17 (!) 23  Temp:    SpO2: 99% 97%    Constitutional: Alert and oriented.  Eyes: Conjunctivae are normal.  Head: Atraumatic. Nose: No congestion/rhinnorhea. Mouth/Throat: Mucous membranes are moist.   Neck: No stridor. Painless ROM.  Cardiovascular: Normal rate, regular rhythm. Grossly normal heart sounds.  Good peripheral circulation. Respiratory: Normal respiratory effort.  No retractions. Lungs with coarse expiratory wheeze  throughout Gastrointestinal: Soft and nontender. No distention. No abdominal bruits. No CVA tenderness. Genitourinary: deferred Musculoskeletal: No lower extremity tenderness nor edema.  No joint effusions. Neurologic:  Normal speech and language. No gross focal neurologic deficits are appreciated. No facial droop Skin:  Skin is warm, dry and intact. No rash noted. Psychiatric: Mood and affect are normal. Speech and behavior are normal.  ____________________________________________   LABS (all labs ordered are listed, but only abnormal results are displayed)  Results for orders placed or performed during the hospital encounter of 02/14/20 (from the past 24 hour(s))  Basic metabolic panel     Status: Abnormal   Collection Time: 02/14/20  9:12 AM  Result Value Ref Range   Sodium 137 135 - 145 mmol/L   Potassium 4.1 3.5 - 5.1 mmol/L   Chloride 100 98 - 111 mmol/L   CO2 26 22 - 32 mmol/L  Glucose, Bld 144 (H) 70 - 99 mg/dL   BUN 20 8 - 23 mg/dL   Creatinine, Ser 0.97 0.44 - 1.00 mg/dL   Calcium 8.8 (L) 8.9 - 10.3 mg/dL   GFR calc non Af Amer 53 (L) >60 mL/min   GFR calc Af Amer >60 >60 mL/min   Anion gap 11 5 - 15  CBC     Status: Abnormal   Collection Time: 02/14/20  9:12 AM  Result Value Ref Range   WBC 9.9 4.0 - 10.5 K/uL   RBC 5.16 (H) 3.87 - 5.11 MIL/uL   Hemoglobin 12.8 12.0 - 15.0 g/dL   HCT 39.5 36 - 46 %   MCV 76.6 (L) 80.0 - 100.0 fL   MCH 24.8 (L) 26.0 - 34.0 pg   MCHC 32.4 30.0 - 36.0 g/dL   RDW 15.5 11.5 - 15.5 %   Platelets 243 150 - 400 K/uL   nRBC 0.0 0.0 - 0.2 %  Troponin I (High Sensitivity)     Status: None   Collection Time: 02/14/20  9:12 AM  Result Value Ref Range   Troponin I (High Sensitivity) 9 <18 ng/L  Troponin I (High Sensitivity)     Status: None   Collection Time: 02/14/20 10:56 AM  Result Value Ref Range   Troponin I (High Sensitivity) 10 <18 ng/L  SARS Coronavirus 2 by RT PCR (hospital order, performed in [place] hospital lab)  Nasopharyngeal Nasopharyngeal Swab     Status: None   Collection Time: 02/14/20 11:33 AM   Specimen: Nasopharyngeal Swab  Result Value Ref Range   SARS Coronavirus 2 NEGATIVE NEGATIVE   ____________________________________________  EKG My review and personal interpretation at Time: 9:03   Indication: sob  Rate: 80  Rhythm: afib Axis: normal Other: normal intervals, no stemi ____________________________________________  RADIOLOGY  I personally reviewed all radiographic images ordered to evaluate for the above acute complaints and reviewed radiology reports and findings.  These findings were personally discussed with the patient.  Please see medical record for radiology report.  ____________________________________________   PROCEDURES  Procedure(s) performed:  Procedures    Critical Care performed: no ____________________________________________   INITIAL IMPRESSION / ASSESSMENT AND PLAN / ED COURSE  Pertinent labs & imaging results that were available during my care of the patient were reviewed by me and considered in my medical decision making (see chart for details).   DDX: Asthma, copd, CHF, pna, ptx, malignancy, Pe, anemia   RUDEAN ICENHOUR is a 86 y.o. who presents to the ED with chest tightness and shortness of breath as described above her exam is most consistent with acute bronchitis and COPD.  Her chest x-ray shows no evidence of pneumothorax infiltrates or edema.  No effusions.  EKG is nonischemic and her troponin is negative.  Will give DuoNeb and steroid and reassess.  This does not seem clinically consistent with PE based on wheezing on exam and already on [facility].  Will reassess.  Clinical Course as of Feb 14 1439  Tue Feb 14, 2020  1250 Patient feels significant improvement after single DuoNeb.  Repeat troponin nonischemic.  Will give second DuoNeb and ambulate to see how her respiration status is b before deciding whether she will require further observation  the hospital or is okay for outpatient follow-up but she feels otherwise significantly improved   [PR]  1439 After further observation patient redeveloping more wheezing and recurrent shortness of breath.  She is not hypoxic but given her age and risk  factors I do believe she should be observed in the hospital for additional nebulizers steroids and medical management.  Have discussed with the patient and available family all diagnostics and treatments performed thus far and all questions were answered to the best of my ability. The patient demonstrates understanding and agreement with plan.    [PR]    Clinical Course User Index [PR] Merlyn Lot, MD    The patient was evaluated in Emergency Department today for the symptoms described in the history of present illness. He/she was evaluated in the context of the global COVID-19 pandemic, which necessitated consideration that the patient might be at risk for infection with the SARS-CoV-2 virus that causes COVID-19. Institutional protocols and algorithms that pertain to the evaluation of patients at risk for COVID-19 are in a state of rapid change based on information released by regulatory bodies including the CDC and federal and state organizations. These policies and algorithms were followed during the patient's care in the ED.  As part of my medical decision making, I reviewed the following data within the [place] notes reviewed and incorporated, Labs reviewed, notes from prior ED visits and [place] Controlled Substance Database   ____________________________________________   FINAL CLINICAL IMPRESSION(S) / ED DIAGNOSES  Final diagnoses:  Chronic obstructive pulmonary disease with acute exacerbation ([place])      NEW MEDICATIONS STARTED DURING THIS VISIT:  New Prescriptions   No medications on file     Note:  This document was prepared using Dragon voice recognition software and may include unintentional  dictation errors.    Merlyn Lot, MD 02/14/20 1440

## 2020-02-15 DIAGNOSIS — J440 Chronic obstructive pulmonary disease with acute lower respiratory infection: Secondary | ICD-10-CM | POA: Diagnosis not present

## 2020-02-15 DIAGNOSIS — J209 Acute bronchitis, unspecified: Secondary | ICD-10-CM

## 2020-02-15 LAB — HIV ANTIBODY (ROUTINE TESTING W REFLEX): HIV Screen 4th Generation wRfx: NONREACTIVE

## 2020-02-15 MED ORDER — LEVALBUTEROL TARTRATE 45 MCG/ACT IN AERO
2.0000 | INHALATION_SPRAY | RESPIRATORY_TRACT | 2 refills | Status: DC | PRN
Start: 2020-02-15 — End: 2021-06-06

## 2020-02-15 MED ORDER — POTASSIUM CHLORIDE CRYS ER 10 MEQ PO TBCR: 10.0000 meq | EXTENDED_RELEASE_TABLET | Freq: Every day | ORAL | Status: DC

## 2020-02-15 MED ORDER — PREDNISONE 20 MG PO TABS: ORAL_TABLET | ORAL | 0 refills | Status: AC

## 2020-02-15 MED ORDER — GABAPENTIN 300 MG PO CAPS: 300.0000 mg | ORAL_CAPSULE | Freq: Two times a day (BID) | ORAL | Status: DC

## 2020-02-15 NOTE — Evaluation (Signed)
Occupational Therapy Evaluation Patient Details Name: Molly Hodges MRN: [id number] DOB: 06-28-1932 Today's Date: 02/15/2020    History of Present Illness Molly Hodges is a 86 y.o. female with medical history significant for asthma, COPD, paroxysmal A. fib, hypertension, glaucoma who presents to the emergency room for evaluation of chest tightness and shortness of breath which started acutely overnight. Patient symptoms improved in the ER following administration of bronchodilator therapy.   Clinical Impression   [name] was seen for OT evaluation this date. Pt received seated at EOB, agreeable to OT evaluation. Pt lives with her spouse in a 1-level home with 4 steps to enter and bilateral hand rails at the back. She is independent with all ADL/IADL tasks at baseline. Pt reports her symptoms have generally resolved and that her breathing is much better than it was yesterday. She denies and difficulties with bathing, dressing, or grooming tasks, and feels she is back to her baseline level of functional independence to perform ADL/IADL tasks. No strength, sensory, or balance deficits noted with assessment. Pt HR and SPO2 on room air remain WNL (HR 70's; spO2 97-98%). Pt noted to ambulate ad lib in room during evaluation without an assistive device. OT provides pt with energy conservation handout for reference. No further skilled OT needs identified. Will sign off at this time. Do not anticipate need for follow-up OT services upon hospital DC.     Follow Up Recommendations  No OT follow up    Equipment Recommendations  None recommended by OT    Recommendations for Other Services       Precautions / Restrictions Precautions Precautions: Fall Precaution Comments: Moderate Fall Restrictions Weight Bearing Restrictions: No      Mobility Bed Mobility Overal bed mobility: Independent             General bed mobility comments: Pt recieved seated at EOB.  Transfers Overall  transfer level: Independent Equipment used: None             General transfer comment: Pt up ad lib in room. Ambulates w/o device at baseline level. Denies dizziness, lightheadedness, or LOB during admission.    Balance Overall balance assessment: Independent                                         ADL either performed or assessed with clinical judgement   ADL Overall ADL's : At baseline                                       General ADL Comments: Pt reports symptoms have resolved. Feels back to baseline level of functional independence.     Vision Baseline Vision/History: Wears glasses Wears Glasses: At all times Patient Visual Report: No change from baseline       Perception     Praxis      Pertinent Vitals/Pain Pain Assessment: No/denies pain     Hand Dominance Right   Extremity/Trunk Assessment Upper Extremity Assessment Upper Extremity Assessment: Overall WFL for tasks assessed (BUE strength, AROM, FMC WFLs.)   Lower Extremity Assessment Lower Extremity Assessment: Overall WFL for tasks assessed   Cervical / Trunk Assessment Cervical / Trunk Assessment: Normal   Communication Communication Communication: No difficulties   Cognition Arousal/Alertness: Awake/alert Behavior During Therapy: WFL for tasks assessed/performed Overall Cognitive  Status: Within Functional Limits for tasks assessed                                     General Comments  Pt HR & spO2 noted to be WNL with pt up ad lib in room on RA.    Exercises Other Exercises Other Exercises: Pt educated on role of OT in acute setting and energy conservation strategies to maximize safety and functional indep upon return home. Handout provided.   Shoulder Instructions      Home Living Family/patient expects to be discharged to:: Private residence Living Arrangements: Spouse/significant other Available Help at Discharge: Family Type of Home:  House Home Access: Stairs to enter [employer] of Steps: 4 at back Entrance Stairs-Rails: Right;Left;Can reach both [place]: One level     Bathroom Shower/Tub: Walk-in shower                    Prior Functioning/Environment Level of Independence: Independent        Comments: Indep with ADLs, household and community mobility without AE, Denies falls history in past 6 months.        OT Problem List: Cardiopulmonary status limiting activity;Decreased knowledge of use of DME or AE;Decreased safety awareness      OT Treatment/Interventions:      OT Goals(Current goals can be found in the care plan section) Acute Rehab OT Goals Patient Stated Goal: To go home OT Goal Formulation: All assessment and education complete, DC therapy Time For Goal Achievement: 02/15/20 Potential to Achieve Goals: Good  OT Frequency:     Barriers to D/C:            Co-evaluation              AM-PAC OT "6 Clicks" Daily Activity     Outcome Measure Help from another person eating meals?: None Help from another person taking care of personal grooming?: None Help from another person toileting, which includes using toliet, bedpan, or urinal?: None Help from another person bathing (including washing, rinsing, drying)?: None Help from another person to put on and taking off regular upper body clothing?: None Help from another person to put on and taking off regular lower body clothing?: None 6 Click Score: 24   End of Session Nurse Communication: Mobility status;Other (comment) (OT signing off. No acute needs.)  Activity Tolerance: Patient tolerated treatment well Patient left: in bed;with call bell/phone within reach  OT Visit Diagnosis: Other abnormalities of gait and mobility (R26.89)                Time: 3817-7116 OT Time Calculation (min): 12 min Charges:  OT General Charges $OT Visit: 1 Visit OT Evaluation $OT Eval Low Complexity: 1 Low  Shara Blazing,  M.S., OTR/L Ascom: [phone] 02/15/20, 9:53 AM

## 2020-02-15 NOTE — Discharge Summary (Signed)
Physician Discharge Summary  [name] SVX:[id number] DOB: [birth year] DOA: 02/14/2020  PCP: Birdie Sons, MD  Admit date: 02/14/2020 Discharge date: 02/15/2020  Recommendations for Outpatient Follow-up:  1. Discharge to home. 2. Follow up with PCP in 7-10 days. 3. Complete steroid taper. 4. Allow no one to smoke around you. 5. Chemistry to be drawn at PCP visit.  Discharge Diagnoses: Principal diagnosis is #1 1.  COPD with acute bronchitis 2. Paroxysmal atrial fibrillation 3. Hypertension  Discharge Condition: Fair  Disposition: Home  Diet recommendation: Heart healthy  Filed Weights   02/14/20 0900  Weight: 66.2 kg   History of present illness:  Molly Hodges is a 86 y.o. female with medical history significant for asthma, COPD, paroxysmal A. fib, hypertension, glaucoma who presents to the emergency room for evaluation of chest tightness and shortness of breath which started acutely overnight.  Patient also complains of congestion and a cough which is nonproductive.  She denies having any fever or chills.  She denies having any chest pain, nausea, vomiting, diaphoresis or palpitations. Chest x-ray shows stable cardiac enlargement with pleuroparenchymal scarring and hyperinflation. Twelve-lead EKG shows atrial fibrillation  ED Course: Patient is an 86 year old Caucasian female with a history of asthma/COPD who presents to the emergency room for evaluation of shortness of breath, chest tightness, congestion and a nonproductive cough.  Patient symptoms improved in the ER following administration of bronchodilator therapy.  Patient does not have a rescue inhaler or nebulizer at home.  She was ambulated in the ER to assess for discharge but she became short of breath and tachycardic.  She will be referred to observation status for further evaluation. Patient received systemic steroids and IV magnesium in the ER.  Hospital Course:  The patient was admitted to a  telemetry bed. She was given IV steroids, nebulizers, and ws continued on amiodarone for rate control. Apixaban was also continued. This morning the patient was saturating in the 90's on room air. She ambulated in the halls on room air with no difficulty or need of supplemental O2. She will be discharged to home in fair control.   Today's assessment: S: The patient is resting comfortably. No new complaints. O: Vitals:  Vitals:   02/15/20 0805 02/15/20 1107  BP: 116/67 (!) 138/57  Pulse: 72 72  Resp: 16 16  Temp: 98 F (36.7 C) 98 F (36.7 C)  SpO2: 97% 98%    Exam:  Constitutional:  . The patient is awake, alert, and oriented x 3. No acute distress. Respiratory:  . No increased work of breathing. . No wheezes, rales, or rhonchi . No tactile fremitus Cardiovascular:  . Regular rate and rhythm . No murmurs, ectopy, or gallups. . No lateral PMI. No thrills. Abdomen:  . Abdomen is soft, non-tender, non-distended . No hernias, masses, or organomegaly . Normoactive bowel sounds.  Musculoskeletal:  . No cyanosis, clubbing, or edema Skin:  . No rashes, lesions, ulcers . palpation of skin: no induration or nodules Neurologic:  . CN 2-12 intact . Sensation all 4 extremities intact Psychiatric:  . Mental status o Mood, affect appropriate o Orientation to person, place, time  . judgment and insight appear intact  Discharge Instructions  Discharge Instructions    Activity as tolerated - No restrictions   Complete by: As directed    Call MD for:  difficulty breathing, headache or visual disturbances   Complete by: As directed    Call MD for:  temperature >100.4   Complete by:  As directed    Diet - low sodium heart healthy   Complete by: As directed    Discharge instructions   Complete by: As directed    Discharge to home. Follow up with PCP in 7-10 days. Complete steroid taper. Allow no one to smoke around you. Chemistry to be drawn at PCP visit.   Increase activity  slowly   Complete by: As directed      Allergies as of 02/15/2020      Reactions   Shellfish Allergy Anaphylaxis   Albuterol    Heart racing.    Azithromycin Other (See Comments)   Extreme burning sensation at IV site   Tamiflu [oseltamivir Phosphate] Other (See Comments)   Reaction:  Hallucinations  Reaction:  Hallucinations       Medication List    STOP taking these medications   potassium chloride 10 MEQ tablet Commonly known as: KLOR-CON Replaced by: potassium chloride 10 MEQ tablet     TAKE these medications   amiodarone 200 MG tablet Commonly known as: PACERONE TAKE 1 TABLET BY MOUTH DAILY   apixaban 5 MG Tabs tablet Commonly known as: Eliquis Take 1 tablet (5 mg total) by mouth 2 (two) times daily.   budesonide-formoterol 160-4.5 MCG/ACT inhaler Commonly known as: SYMBICORT Inhale 2 puffs into the lungs 2 (two) times daily.   Calcium 600+D 600-400 MG-UNIT tablet Generic drug: Calcium Carbonate-Vitamin D Take 1 tablet by mouth daily.   cholecalciferol 1000 units tablet Commonly known as: VITAMIN D Take 1,000 Units by mouth daily.   diltiazem 180 MG 24 hr capsule Commonly known as: CARDIZEM CD TAKE 1 CAPSULE EVERY DAY   dorzolamide 2 % ophthalmic solution Commonly known as: TRUSOPT Place 1 drop into both eyes 2 (two) times daily.   Fish Oil Burp-Less 1000 MG Caps Take 1,000 mg by mouth daily.   gabapentin 300 MG capsule Commonly known as: NEURONTIN Take 1 capsule (300 mg total) by mouth 2 (two) times daily. What changed: See the new instructions.   hydrochlorothiazide 12.5 MG capsule Commonly known as: MICROZIDE TAKE 1 CAPSULE BY MOUTH EVERY DAY What changed: how much to take   latanoprost 0.005 % ophthalmic solution Commonly known as: XALATAN Place 1 drop into both eyes at bedtime.   levalbuterol 45 MCG/ACT inhaler Commonly known as: XOPENEX HFA Inhale 2 puffs into the lungs every 4 (four) hours as needed for wheezing.   magnesium oxide  400 MG tablet Commonly known as: MAG-OX Take 400 mg by mouth daily.   potassium chloride 10 MEQ tablet Commonly known as: KLOR-CON Take 1 tablet (10 mEq total) by mouth daily. Start taking on: February 16, 2020 Replaces: potassium chloride 10 MEQ tablet   predniSONE 20 MG tablet Commonly known as: DELTASONE Take 2 tablets (40 mg total) by mouth daily with breakfast for 3 days, THEN 1.5 tablets (30 mg total) daily with breakfast for 3 days, THEN 1 tablet (20 mg total) daily with breakfast for 3 days, THEN 0.5 tablets (10 mg total) daily with breakfast for 3 days. Start taking on: February 16, 2020   vitamin C 500 MG tablet Commonly known as: ASCORBIC ACID Take 1,000 mg by mouth daily.   vitamin E 45 MG (100 UNITS) capsule Take 200 Units by mouth daily.      Allergies  Allergen Reactions  . Shellfish Allergy Anaphylaxis  . Albuterol     Heart racing.   . Azithromycin Other (See Comments)    Extreme burning sensation at IV site  .  Tamiflu [Oseltamivir Phosphate] Other (See Comments)    Reaction:  Hallucinations  Reaction:  Hallucinations     The results of significant diagnostics from this hospitalization (including imaging, microbiology, ancillary and laboratory) are listed below for reference.    Significant Diagnostic Studies: DG Chest 2 View  Result Date: 02/14/2020 CLINICAL DATA:  Shortness of breath chest pain EXAM: CHEST - 2 VIEW COMPARISON:  08/21/2016 and CT of September 21, 2015 FINDINGS: Stable cardiac enlargement.  Hilar structures are normal. Apical scarring worse on the LEFT. No lobar consolidation. Signs of scarring at the bilateral lung bases likely in RIGHT lower lobe and lingula based on previous imaging. Mild hyperinflation. No pleural effusion. Osteopenia and spinal degenerative change. IMPRESSION: Stable cardiac enlargement with pleuroparenchymal scarring and hyperinflation. Electronically Signed   By: Zetta Bills M.D.   On: 02/14/2020 09:22     Microbiology: Recent Results (from the past 240 hour(s))  SARS Coronavirus 2 by RT PCR (hospital order, performed in [facility] hospital lab) Nasopharyngeal Nasopharyngeal Swab     Status: None   Collection Time: 02/14/20 11:33 AM   Specimen: Nasopharyngeal Swab  Result Value Ref Range Status   SARS Coronavirus 2 NEGATIVE NEGATIVE Final    Comment: (NOTE) SARS-CoV-2 target nucleic acids are NOT DETECTED.  The SARS-CoV-2 RNA is generally detectable in upper and lower respiratory specimens during the acute phase of infection. The lowest concentration of SARS-CoV-2 viral copies this assay can detect is 250 copies / mL. A negative result does not preclude SARS-CoV-2 infection and should not be used as the sole basis for treatment or other patient management decisions.  A negative result may occur with improper specimen collection / handling, submission of specimen other than nasopharyngeal swab, presence of viral mutation(s) within the areas targeted by this assay, and inadequate number of viral copies (<250 copies / mL). A negative result must be combined with clinical observations, patient history, and epidemiological information.  Fact Sheet for Patients:   StrictlyIdeas.no  Fact Sheet for Healthcare Providers: BankingDealers.co.za  This test is not yet approved or  cleared by the [place] FDA and has been authorized for detection and/or diagnosis of SARS-CoV-2 by FDA under an Emergency Use Authorization (EUA).  This EUA will remain in effect (meaning this test can be used) for the duration of the COVID-19 declaration under Section 564(b)(1) of the Act, 21 U.S.C. section 360bbb-3(b)(1), unless the authorization is terminated or revoked sooner.  Performed at [facility], [address] [place], [place] [address]      Labs: Basic Metabolic Panel: Recent Labs  Lab 02/14/20 0912  NA 137  K 4.1  CL 100   CO2 26  GLUCOSE 144*  BUN 20  CREATININE 0.97  CALCIUM 8.8*   Liver Function Tests: No results for input(s): AST, ALT, ALKPHOS, BILITOT, PROT, ALBUMIN in the last 168 hours. No results for input(s): LIPASE, AMYLASE in the last 168 hours. No results for input(s): AMMONIA in the last 168 hours. CBC: Recent Labs  Lab 02/14/20 0912  WBC 9.9  HGB 12.8  HCT 39.5  MCV 76.6*  PLT 243   Cardiac Enzymes: No results for input(s): CKTOTAL, CKMB, CKMBINDEX, TROPONINI in the last 168 hours. BNP: BNP (last 3 results) No results for input(s): BNP in the last 8760 hours.  ProBNP (last 3 results) No results for input(s): PROBNP in the last 8760 hours.  CBG: No results for input(s): GLUCAP in the last 168 hours.  Principal Problem:   COPD with  acute bronchitis ([place]) Active Problems:   Hypertension   Paroxysmal atrial fibrillation ([place])   Time coordinating discharge: 38 minutes  Signed:        Neomi Laidler, DO [facility]  02/15/2020, 2:42 PM

## 2020-02-15 NOTE — Progress Notes (Signed)
PT Cancellation Note  Patient Details Name: DESTANIE TIBBETTS MRN: [id number] DOB: 09/20/1931   Cancelled Treatment:    Reason Eval/Treat Not Completed: Other (comment). OT screened and per discussion, has no current rehab needs at this time. Per chart review, has been ambulatory in room without issue. Will dc current orders at this time. Please re-order if needs change.   Tomeka Kantner 02/15/2020, 9:37 AM Greggory Stallion, PT, DPT [phone]

## 2020-02-21 ENCOUNTER — Telehealth: Payer: Self-pay

## 2020-02-21 NOTE — Telephone Encounter (Signed)
Hospital discharge was 02/15/20 Medication discharge list was  as below reviewed today 02/21/20/  I do see budesonide inhaler on list at discharge and no note of nebulizer script or mention of this.    Intolerance noted to albuterol in chart. Looks like xopenex was used in hospital. She was  discharged with steroid dose pack.  Please verify with daughter if patient is having worsening symptoms and any other inhalers she is using currently ?  If worsening symptoms since discharge 6 days ago please seek care immediately.    Medication List at Discharge   Amiodarone HCl 200 MG TAKE 1 TABLET BY MOUTH DAILY  Patient taking differently:  Take 200 mg by mouth daily.   Apixaban 5 mg Oral 2 times daily  Ascorbic Acid 1,000 mg Oral Daily  Budesonide-Formoterol Fumarate 160-4.5 MCG/ACT 2 puffs Inhalation 2 times daily  Calcium Carbonate-Vitamin D 600-400 MG-UNIT 1 tablet Oral Daily  Cholecalciferol 1,000 Units Oral Daily  dilTIAZem HCl Coated Beads 180 MG TAKE 1 CAPSULE EVERY DAY  Patient taking differently:  Take 180 mg by mouth daily.   Dorzolamide HCl 2 % 1 drop Both Eyes 2 times daily  Gabapentin 300 mg Oral 2 times daily  hydroCHLOROthiazide 12.5 MG TAKE 1 CAPSULE BY MOUTH EVERY DAY  Patient taking differently:  Take 12.5 mg by mouth daily.   Latanoprost 0.005 % 1 drop Both Eyes Daily at bedtime  Levalbuterol Tartrate 45 MCG/ACT 2 puffs Inhalation Every 4 hours PRN  Magnesium Oxide 400 mg Oral Daily  Omega-3 Fatty Acids 1,000 mg Oral Daily  Potassium Chloride Crys ER 10 MEQ 10 mEq Oral Daily  predniSONE 20 MG Take 2 tablets (40 mg total) by mouth daily with breakfast for 3 days, THEN 1.5 tablets (30 mg total) daily with breakfast for 3 days, THEN 1 tablet (20 mg total) daily with breakfast for 3 days, THEN 0.5 tablets (10 mg total) daily with breakfast for 3 days.  Vitamin E 200 Units Oral Daily

## 2020-02-21 NOTE — Telephone Encounter (Signed)
Copied from [place] [id number]. Topic: General - Other >> Feb 21, 2020 10:36 AM Leward Quan A wrote: Reason for CRM: Patient granddaughter Lenna Sciara called to say that patient need an Rx sent to her pharmacy for a Nebulizer per request of the Dr when patient was discharged from the hospital. Asking if that can be done ASAP please any questions please call Ph#  [phone]

## 2020-02-21 NOTE — Telephone Encounter (Signed)
Please review chart and advise. KW 

## 2020-02-22 NOTE — Telephone Encounter (Signed)
Patient's granddaughter Lenna Sciara advised. She states she will advise patient.

## 2020-02-22 NOTE — Telephone Encounter (Signed)
Patient granddaughter Molly Hodges states that patient's discharge summary does not mention nebulizer, but that is what the admitting provider advised patient to do when she was discharged. Melissa states patient is using inhaler with mild relief. Patient's symptoms are slightly improved, but still having a lot of wheezing . Melissa reports she can hear patient across the room wheezing and "crackling"  Patient is using the inhaler and took the steroid dose pack. Please advise.

## 2020-02-22 NOTE — Telephone Encounter (Signed)
Since discharged was 02/15/20 and daughter hearing "crackling" I recommend she be evaluated in  person at [facility] or urgent care of choice so that hands on exam can be performed.

## 2020-03-02 NOTE — Progress Notes (Signed)
I,Roshena L Chambers,acting as a scribe for Lelon Huh, MD.,have documented all relevant documentation on the behalf of Lelon Huh, MD,as directed by  Lelon Huh, MD while in the presence of Lelon Huh, MD.  Established patient visit   Patient: Molly Hodges   DOB: 24-Apr-1932   86 y.o. Female  MRN: [id number] Visit Date: 03/05/2020  Today's healthcare provider: Lelon Huh, MD   Chief Complaint  Patient presents with  . Hypertension   Subjective    HPI  Hypertension, follow-up  BP Readings from Last 3 Encounters:  03/05/20 138/60  02/15/20 (!) 138/57  11/30/19 (!) 176/62   Wt Readings from Last 3 Encounters:  03/05/20 143 lb (64.9 kg)  02/14/20 146 lb (66.2 kg)  11/30/19 146 lb (66.2 kg)     She was last seen for hypertension 3 months ago.  BP at that visit was 176/62. Management since that visit includes counseling patient to work on avoiding sodium in diet.  Continue current medications.  Continued home BP monitoring and follow up in about 3 months.  she had been enduring some stressful home situations at her last visit which have since resolved. She feels that had contributed to her elevated blood pressure.   She reports good compliance with treatment. She is not having side effects.  She is following a Regular diet. She is not exercising. She does not smoke.  Use of agents associated with hypertension: none.   Outside blood pressures are 115-135/ 54-60. Symptoms: No chest pain No chest pressure  No palpitations No syncope  No dyspnea No orthopnea  No paroxysmal nocturnal dyspnea No lower extremity edema   Pertinent labs: Lab Results  Component Value Date   CHOL 208 (H) 07/11/2019   HDL 55 07/11/2019   LDLCALC 135 (H) 07/11/2019   TRIG 102 07/11/2019   CHOLHDL 3.8 07/11/2019   Lab Results  Component Value Date   NA 137 02/14/2020   K 4.1 02/14/2020   CREATININE 0.97 02/14/2020   GFRNONAA 53 (L) 02/14/2020   GFRAA >60 02/14/2020    GLUCOSE 144 (H) 02/14/2020     The ASCVD Risk score (Goff DC Jr., et al., 2013) failed to calculate for the following reasons:   The 2013 ASCVD risk score is only valid for ages 58 to 57   ---------------------------------------------------------------------------------------------------  Follow up Hospitalization  Patient was admitted to [facility] on 02/14/2020 and discharged on 02/15/2020. She was treated for COPD acute exacerbation.    Treatment for this included placing patient on as needed bronchodilator therapy and oral prednisone. She was to continue inhaled steroids. No need for antibiotics at this time. Patient was to be given a prescription for a rescue inhaler/nebulizer upon discharge. Telephone follow up was not done. She reports good compliance with treatment. She reports this condition is improved.  ----------------------------------------------------------------------------------------- -      Medications: Outpatient Medications Prior to Visit  Medication Sig  . amiodarone (PACERONE) 200 MG tablet TAKE 1 TABLET BY MOUTH DAILY (Patient taking differently: Take 200 mg by mouth daily. )  . apixaban (ELIQUIS) 5 MG TABS tablet Take 1 tablet (5 mg total) by mouth 2 (two) times daily.  . budesonide-formoterol (SYMBICORT) 160-4.5 MCG/ACT inhaler Inhale 2 puffs into the lungs 2 (two) times daily.  . Calcium Carbonate-Vitamin D (CALCIUM 600+D) 600-400 MG-UNIT tablet Take 1 tablet by mouth daily.  . cholecalciferol (VITAMIN D) 1000 UNITS tablet Take 1,000 Units by mouth daily.  Marland Kitchen diltiazem (CARDIZEM CD) 180 MG 24 hr capsule  TAKE 1 CAPSULE EVERY DAY (Patient taking differently: Take 180 mg by mouth daily. )  . dorzolamide (TRUSOPT) 2 % ophthalmic solution Place 1 drop into both eyes 2 (two) times daily.   Marland Kitchen gabapentin (NEURONTIN) 300 MG capsule Take 1 capsule (300 mg total) by mouth 2 (two) times daily.  . hydrochlorothiazide (MICROZIDE) 12.5 MG capsule TAKE 1 CAPSULE BY MOUTH EVERY  DAY (Patient taking differently: Take 12.5 mg by mouth daily. )  . latanoprost (XALATAN) 0.005 % ophthalmic solution Place 1 drop into both eyes at bedtime.   . levalbuterol (XOPENEX HFA) 45 MCG/ACT inhaler Inhale 2 puffs into the lungs every 4 (four) hours as needed for wheezing.  . magnesium oxide (MAG-OX) 400 MG tablet Take 400 mg by mouth daily.  . Omega-3 Fatty Acids (FISH OIL BURP-LESS) 1000 MG CAPS Take 1,000 mg by mouth daily.   . potassium chloride (KLOR-CON) 10 MEQ tablet Take 1 tablet (10 mEq total) by mouth daily.  . vitamin C (ASCORBIC ACID) 500 MG tablet Take 1,000 mg by mouth daily.  . vitamin E 100 UNIT capsule Take 200 Units by mouth daily.   No facility-administered medications prior to visit.    Review of Systems    Objective    BP 138/60 (BP Location: Right Arm, Cuff Size: Normal)   Pulse 75   Temp (!) 97.3 F (36.3 C) (Temporal)   Resp 16   Wt 143 lb (64.9 kg)   SpO2 99% Comment: room air  BMI 23.80 kg/m    Physical Exam   General appearance: Well developed, well nourished female, cooperative and in no acute distress Head: Normocephalic, without obvious abnormality, atraumatic Respiratory: Respirations even and unlabored, normal respiratory rate Extremities: All extremities are intact.  Skin: Skin color, texture, turgor normal. No rashes seen  Psych: Appropriate mood and affect. Neurologic: Mental status: Alert, oriented to person, place, and time, thought content appropriate.      Assessment & Plan     1. Essential hypertension Much better today. Was likely elevated due to stress at last visit. Continue current medications.    2. Centrilobular emphysema ([place]) Doing well with - budesonide-formoterol (SYMBICORT) 160-4.5 MCG/ACT inhaler; Inhale 2 puffs into the lungs 2 (two) times daily.  Dispense: 1 Inhaler; Refill: 12   Future Appointments  Date Time Provider [place]  09/07/2020  9:40 AM Caryn Section, Kirstie Peri, MD [facility]         The  entirety of the information documented in the History of Present Illness, Review of Systems and Physical Exam were personally obtained by me. Portions of this information were initially documented by the CMA and reviewed by me for thoroughness and accuracy.      Lelon Huh, MD  [facility] [phone] (phone) [phone] (fax)  [place]

## 2020-03-05 ENCOUNTER — Ambulatory Visit (INDEPENDENT_AMBULATORY_CARE_PROVIDER_SITE_OTHER): Payer: Medicare Other | Admitting: Family Medicine

## 2020-03-05 ENCOUNTER — Encounter: Payer: Self-pay | Admitting: Family Medicine

## 2020-03-05 ENCOUNTER — Other Ambulatory Visit: Payer: Self-pay

## 2020-03-05 VITALS — BP 138/60 | HR 75 | Temp 97.3°F | Resp 16 | Wt 143.0 lb

## 2020-03-05 DIAGNOSIS — J432 Centrilobular emphysema: Secondary | ICD-10-CM | POA: Diagnosis not present

## 2020-03-05 DIAGNOSIS — I10 Essential (primary) hypertension: Secondary | ICD-10-CM

## 2020-03-05 MED ORDER — BUDESONIDE-FORMOTEROL FUMARATE 160-4.5 MCG/ACT IN AERO: 2.0000 | INHALATION_SPRAY | Freq: Two times a day (BID) | RESPIRATORY_TRACT | 12 refills | Status: DC

## 2020-03-19 ENCOUNTER — Telehealth: Payer: Self-pay | Admitting: Cardiovascular Disease

## 2020-03-19 NOTE — Telephone Encounter (Signed)
Pt c/o medication issue:  1. Name of Medication: Eliquis   2. How are you currently taking this medication (dosage and times per day)? 5 mg bid  3. Are you having a reaction (difficulty breathing--STAT)? n/a  4. What is your medication issue? Cost. Patient states the medication is too costly for her at this time and is looking for some help to cut down the cost  Please advise

## 2020-03-23 NOTE — Telephone Encounter (Signed)
Left voicemail message to call back  

## 2020-03-26 MED ORDER — APIXABAN 5 MG PO TABS: 5.0000 mg | ORAL_TABLET | Freq: Two times a day (BID) | ORAL | 3 refills | Status: DC

## 2020-03-26 NOTE — Telephone Encounter (Signed)
Spoke with patients daughter per release form. She states her Eliquis is $200.00 per month and this is difficult for her to pay. She also mentioned that she has received assistance in the past so needs to reapply. Advised documents needed 1040 along with out of pocket expenses for both her and spouse. Will fill out application and go get her signature. Daughter will drop off additional documents needed in order to process application. She was appreciative for the call back, agreement with plan, and had no further questions at this time.

## 2020-03-26 NOTE — Telephone Encounter (Signed)
Patient daughter returning call  °

## 2020-03-26 NOTE — Telephone Encounter (Signed)
Left voicemail message to call back  

## 2020-03-28 NOTE — Telephone Encounter (Signed)
Application faxed to company and will make sure to fax any other documents they bring by the office when we receive them.

## 2020-03-28 NOTE — Telephone Encounter (Signed)
Forms are complete .  Daughter she will drop off sometime this week.

## 2020-03-30 NOTE — Telephone Encounter (Signed)
Patient daughter dropped off documentation  Given to [facility] for review

## 2020-04-03 NOTE — Telephone Encounter (Signed)
Application with additional required documents faxed to assistance program.

## 2020-04-05 NOTE — Telephone Encounter (Signed)
Patient daughter calling  States that she received a call from [place] at [employer] that her claim was missing proof of income information  States she had sent the 1040 - would like to know if that was actually sent and is sufficient before sending again Please call to clarify

## 2020-04-05 NOTE — Telephone Encounter (Signed)
Left detailed voicemail message that I did send in application without income and then again with income and instructions to call back if any further questions.

## 2020-04-12 ENCOUNTER — Other Ambulatory Visit: Payer: Self-pay | Admitting: Cardiovascular Disease

## 2020-04-12 ENCOUNTER — Other Ambulatory Visit: Payer: Self-pay | Admitting: Family Medicine

## 2020-04-14 ENCOUNTER — Other Ambulatory Visit: Payer: Self-pay | Admitting: Family Medicine

## 2020-04-14 NOTE — Telephone Encounter (Signed)
Requested medication (s) are due for refill today: yes  Requested medication (s) are on the active medication list: yes  Last refill: 11/15/19 at hospital discharge  Future visit scheduled: yes  Notes to clinic:  pharmacy requesting RF-    Requested Prescriptions  Pending Prescriptions Disp Refills   gabapentin (NEURONTIN) 300 MG capsule [Pharmacy Med Name: GABAPENTIN 300 MG CAP] 90 capsule     Sig: TAKE 1 CAPSULE EVERY MORNING AND TAKE 2 CAPSULES AT BEDTIME      Neurology: Anticonvulsants - gabapentin Passed - 04/14/2020 10:23 AM      Passed - Valid encounter within last 12 months    Recent Outpatient Visits           1 month ago Essential hypertension   [facility] Birdie Sons, MD   4 months ago Essential hypertension   [facility] Birdie Sons, MD   2 years ago Annual physical exam   [facility] Birdie Sons, MD   2 years ago Fever, unspecified fever cause   [facility] Birdie Sons, MD   3 years ago Vitamin D deficiency   [place], Kirstie Peri, MD       Future Appointments             In 3 months Gollan, Kathlene November, MD [facility], [facility]   In 4 months Fisher, Kirstie Peri, MD [facility], [place]

## 2020-04-26 NOTE — Telephone Encounter (Signed)
Notified [name] Child psychotherapist (daughter) of Molly Hodges the Eliquis has been approved from 05/17/2020 through 08/17/2020 by the patient assistance foundation.

## 2020-05-02 ENCOUNTER — Other Ambulatory Visit: Payer: Self-pay

## 2020-05-02 ENCOUNTER — Ambulatory Visit (INDEPENDENT_AMBULATORY_CARE_PROVIDER_SITE_OTHER): Payer: Medicare Other | Admitting: Family Medicine

## 2020-05-02 DIAGNOSIS — Z23 Encounter for immunization: Secondary | ICD-10-CM

## 2020-05-10 ENCOUNTER — Ambulatory Visit (INDEPENDENT_AMBULATORY_CARE_PROVIDER_SITE_OTHER): Payer: Medicare Other | Admitting: Physician Assistant

## 2020-05-10 DIAGNOSIS — J209 Acute bronchitis, unspecified: Secondary | ICD-10-CM

## 2020-05-10 DIAGNOSIS — J440 Chronic obstructive pulmonary disease with acute lower respiratory infection: Secondary | ICD-10-CM

## 2020-05-10 MED ORDER — AMOXICILLIN-POT CLAVULANATE 875-125 MG PO TABS: 1.0000 | ORAL_TABLET | Freq: Two times a day (BID) | ORAL | 0 refills | Status: DC

## 2020-05-10 MED ORDER — PREDNISONE 20 MG PO TABS: 40.0000 mg | ORAL_TABLET | Freq: Every day | ORAL | 0 refills | Status: AC

## 2020-05-10 NOTE — Progress Notes (Signed)
Virtual telephone visit    Virtual Visit via Telephone Note   This visit type was conducted due to national recommendations for restrictions regarding the COVID-19 Pandemic (e.g. social distancing) in an effort to limit this patient's exposure and mitigate transmission in our community. Due to her co-morbid illnesses, this patient is at least at moderate risk for complications without adequate follow up. This format is felt to be most appropriate for this patient at this time. The patient did not have access to video technology or had technical difficulties with video requiring transitioning to audio format only (telephone). Physical exam was limited to content and character of the telephone converstion.    Patient location: Home Provider location: Office   I discussed the limitations of evaluation and management by telemedicine and the availability of in person appointments. The patient expressed understanding and agreed to proceed.   Visit Date: 05/10/2020  Today's healthcare provider: Trinna Post, PA-C   Chief Complaint  Patient presents with  . Sinus Problem   Subjective    Sinus Problem This is a new problem. The current episode started in the past 7 days. The problem has been waxing and waning since onset. There has been no fever. She is experiencing no pain. Associated symptoms include coughing, a hoarse voice, shortness of breath, sinus pressure, sneezing and a sore throat. Pertinent negatives include no chills, congestion, diaphoresis, ear pain, headaches, neck pain or swollen glands. (Wheezing) Treatments tried: inhalers. The treatment provided mild relief.    Patient also having dizzy spells, no room spinning but feeling of off-balance and nauseated.  PMH: asthma, COPD and pneumonia, patient was advised by Dr. Caryn Section to always call if she has any chest pain, chest tightness, wheezing because of her PMH. Hospitalized with COPD last year.        Medications: Outpatient Medications Prior to Visit  Medication Sig  . amiodarone (PACERONE) 200 MG tablet TAKE ONE TABLET EVERY DAY  . apixaban (ELIQUIS) 5 MG TABS tablet Take 1 tablet (5 mg total) by mouth 2 (two) times daily.  . budesonide-formoterol (SYMBICORT) 160-4.5 MCG/ACT inhaler Inhale 2 puffs into the lungs 2 (two) times daily.  . Calcium Carbonate-Vitamin D (CALCIUM 600+D) 600-400 MG-UNIT tablet Take 1 tablet by mouth daily.  . cholecalciferol (VITAMIN D) 1000 UNITS tablet Take 1,000 Units by mouth daily.  Marland Kitchen diltiazem (CARDIZEM CD) 180 MG 24 hr capsule TAKE 1 CAPSULE EVERY DAY (Patient taking differently: Take 180 mg by mouth daily. )  . dorzolamide (TRUSOPT) 2 % ophthalmic solution Place 1 drop into both eyes 2 (two) times daily.   Marland Kitchen gabapentin (NEURONTIN) 300 MG capsule TAKE 1 CAPSULE EVERY MORNING AND TAKE 2 CAPSULES AT BEDTIME  . hydrochlorothiazide (MICROZIDE) 12.5 MG capsule TAKE 1 CAPSULE BY MOUTH EVERY DAY  . latanoprost (XALATAN) 0.005 % ophthalmic solution Place 1 drop into both eyes at bedtime.   . levalbuterol (XOPENEX HFA) 45 MCG/ACT inhaler Inhale 2 puffs into the lungs every 4 (four) hours as needed for wheezing.  . magnesium oxide (MAG-OX) 400 MG tablet Take 400 mg by mouth daily.  . Omega-3 Fatty Acids (FISH OIL BURP-LESS) 1000 MG CAPS Take 1,000 mg by mouth daily.   . potassium chloride (KLOR-CON) 10 MEQ tablet Take 1 tablet (10 mEq total) by mouth daily.  . vitamin C (ASCORBIC ACID) 500 MG tablet Take 1,000 mg by mouth daily.  . vitamin E 100 UNIT capsule Take 200 Units by mouth daily.   No facility-administered medications prior  to visit.    Review of Systems  Constitutional: Negative for chills and diaphoresis.  HENT: Positive for hoarse voice, sinus pressure, sneezing and sore throat. Negative for congestion and ear pain.   Respiratory: Positive for cough and shortness of breath.   Cardiovascular: Positive for chest pain.  Musculoskeletal: Negative  for neck pain.  Neurological: Negative for headaches.      Objective    There were no vitals taken for this visit.     Assessment & Plan    1. COPD with acute bronchitis (HCC)  Exacerbation, treat as below.   - amoxicillin-clavulanate (AUGMENTIN) 875-125 MG tablet; Take 1 tablet by mouth 2 (two) times daily for 7 days.  Dispense: 14 tablet; Refill: 0 - predniSONE (DELTASONE) 20 MG tablet; Take 2 tablets (40 mg total) by mouth daily with breakfast for 5 days.  Dispense: 10 tablet; Refill: 0    No follow-ups on file.    I discussed the assessment and treatment plan with the patient. The patient was provided an opportunity to ask questions and all were answered. The patient agreed with the plan and demonstrated an understanding of the instructions.   The patient was advised to call back or seek an in-person evaluation if the symptoms worsen or if the condition fails to improve as anticipated.   ITrinna Post, PA-C, have reviewed all documentation for this visit. The documentation on 05/11/20 for the exam, diagnosis, procedures, and orders are all accurate and complete.  The entirety of the information documented in the History of Present Illness, Review of Systems and Physical Exam were personally obtained by me. Portions of this information were initially documented by Elonda Husky, CMA and reviewed by me for thoroughness and accuracy.    Paulene Floor [facility] [phone] (phone) [phone] (fax)  [place]

## 2020-05-14 ENCOUNTER — Other Ambulatory Visit: Payer: Self-pay

## 2020-05-14 ENCOUNTER — Encounter: Payer: Self-pay | Admitting: Emergency Medicine

## 2020-05-14 ENCOUNTER — Emergency Department: Payer: Medicare Other

## 2020-05-14 ENCOUNTER — Ambulatory Visit: Payer: Self-pay | Admitting: *Deleted

## 2020-05-14 ENCOUNTER — Emergency Department
Admission: EM | Admit: 2020-05-14 | Discharge: 2020-05-14 | Disposition: A | Discharge status: Home / Self Care | Payer: Medicare Other | Attending: Emergency Medicine | Admitting: Emergency Medicine

## 2020-05-14 DIAGNOSIS — R0602 Shortness of breath: Secondary | ICD-10-CM | POA: Diagnosis not present

## 2020-05-14 DIAGNOSIS — Z7901 Long term (current) use of anticoagulants: Secondary | ICD-10-CM | POA: Diagnosis not present

## 2020-05-14 DIAGNOSIS — Z79899 Other long term (current) drug therapy: Secondary | ICD-10-CM | POA: Diagnosis not present

## 2020-05-14 DIAGNOSIS — J441 Chronic obstructive pulmonary disease with (acute) exacerbation: Secondary | ICD-10-CM | POA: Diagnosis not present

## 2020-05-14 DIAGNOSIS — I10 Essential (primary) hypertension: Secondary | ICD-10-CM | POA: Insufficient documentation

## 2020-05-14 LAB — COMPREHENSIVE METABOLIC PANEL
ALT: 42 U/L (ref 0–44)
AST: 25 U/L (ref 15–41)
Albumin: 4 g/dL (ref 3.5–5.0)
Alkaline Phosphatase: 90 U/L (ref 38–126)
Anion gap: 12 (ref 5–15)
BUN: 13 mg/dL (ref 8–23)
CO2: 25 mmol/L (ref 22–32)
Calcium: 8.7 mg/dL — ABNORMAL LOW (ref 8.9–10.3)
Chloride: 96 mmol/L — ABNORMAL LOW (ref 98–111)
Creatinine, Ser: 0.75 mg/dL (ref 0.44–1.00)
GFR calc Af Amer: >60 mL/min (ref >60)
GFR calc non Af Amer: >60 mL/min (ref >60)
Glucose, Bld: 129 mg/dL — ABNORMAL HIGH (ref 70–99)
Potassium: 3.5 mmol/L (ref 3.5–5.1)
Sodium: 133 mmol/L — ABNORMAL LOW (ref 135–145)
Total Bilirubin: 0.6 mg/dL (ref 0.3–1.2)
Total Protein: 7.4 g/dL (ref 6.5–8.1)

## 2020-05-14 LAB — CBC WITH DIFFERENTIAL/PLATELET
Abs Immature Granulocytes: 0.09 10*3/uL — ABNORMAL HIGH (ref 0.00–0.07)
Basophils Absolute: 0 10*3/uL (ref 0.0–0.1)
Basophils Relative: 0 %
Eosinophils Absolute: 0 10*3/uL (ref 0.0–0.5)
Eosinophils Relative: 0 %
HCT: 38 % (ref 36.0–46.0)
Hemoglobin: 12.2 g/dL (ref 12.0–15.0)
Immature Granulocytes: 1 %
Lymphocytes Relative: 5 %
Lymphs Abs: 0.6 10*3/uL — ABNORMAL LOW (ref 0.7–4.0)
MCH: 24 pg — ABNORMAL LOW (ref 26.0–34.0)
MCHC: 32.1 g/dL (ref 30.0–36.0)
MCV: 74.8 fL — ABNORMAL LOW (ref 80.0–100.0)
Monocytes Absolute: 0.7 10*3/uL (ref 0.1–1.0)
Monocytes Relative: 5 %
Neutro Abs: 11.3 10*3/uL — ABNORMAL HIGH (ref 1.7–7.7)
Neutrophils Relative %: 89 %
Platelets: 297 10*3/uL (ref 150–400)
RBC: 5.08 MIL/uL (ref 3.87–5.11)
RDW: 15.8 % — ABNORMAL HIGH (ref 11.5–15.5)
WBC: 12.6 10*3/uL — ABNORMAL HIGH (ref 4.0–10.5)
nRBC: 0 % (ref 0.0–0.2)

## 2020-05-14 MED ORDER — ALBUTEROL SULFATE (2.5 MG/3ML) 0.083% IN NEBU
2.5000 mg | INHALATION_SOLUTION | RESPIRATORY_TRACT | 0 refills | Status: DC | PRN
Start: 2020-05-14 — End: 2020-08-31

## 2020-05-14 MED ORDER — ALBUTEROL SULFATE HFA 108 (90 BASE) MCG/ACT IN AERS
2.0000 | INHALATION_SPRAY | Freq: Four times a day (QID) | RESPIRATORY_TRACT | 2 refills | Status: DC | PRN
Start: 2020-05-14 — End: 2021-09-02

## 2020-05-14 MED ORDER — IPRATROPIUM-ALBUTEROL 0.5-2.5 (3) MG/3ML IN SOLN
3.0000 mL | Freq: Once | RESPIRATORY_TRACT | Status: AC
  Administered 2020-05-14: 3 mL via RESPIRATORY_TRACT
  Filled 2020-05-14: qty 3

## 2020-05-14 MED ORDER — DOXYCYCLINE HYCLATE 50 MG PO CAPS
100.0000 mg | ORAL_CAPSULE | Freq: Two times a day (BID) | ORAL | 0 refills | Status: AC
Start: 2020-05-14 — End: 2020-05-19

## 2020-05-14 NOTE — ED Provider Notes (Signed)
[facility] Emergency Department Provider Note   ____________________________________________   First MD Initiated Contact with Patient 05/14/20 1632     (approximate)  I have reviewed the triage vital signs and the nursing notes.   HISTORY  Chief Complaint Shortness of Breath    HPI Molly Hodges is a 86 y.o. female with a stated past medical history of hypertension, glaucoma, and COPD who presents for worsening shortness of breath over the last 5 days.  Patient states she was seen in a telemedicine visit by her primary care physician and placed on prednisone and amoxicillin for the symptoms.  Patient states that her symptoms have steadily gotten worse over this time despite treatment.  Patient states that exertion worsens the symptoms and rest partially relieves them.  Patient denies any recent travel or sick contacts         Past Medical History:  Diagnosis Date  . Asthma   . Community acquired pneumonia   . COPD (chronic obstructive pulmonary disease) ([place])   . Essential hypertension   . Glaucoma   . Mitral regurgitation    a. TTE 08/2015: EF 60-65%, normal wall motion, mild MR, mildly dilated left atrium measuring 40 mm, RVSF normal, PASP normal  . PAF (paroxysmal atrial fibrillation) ([place]) 09/17/2015   a. s/p DCCV 11/15/2015; CHADS2VASc => 4 (HTN, age x 2, female); c. on Eliquis    Patient Active Problem List   Diagnosis Date Noted  . Long-term use of high-risk medication 11/11/2016  . COPD with acute bronchitis ([place]) 11/11/2016  . Lower extremity edema 05/29/2016  . Paroxysmal atrial fibrillation (HCC)   . Elevated troponin   . Hypoxia   . Encounter for anticoagulation discussion and counseling   . Premature ventricular contraction 03/26/2015  . Vitamin D deficiency 03/26/2015  . Peripheral neuropathy 03/26/2015  . Hypertension 03/26/2015  . Asthma 03/26/2015    Past Surgical History:  Procedure Laterality Date  . ABDOMINAL  HYSTERECTOMY  1987   due to heavy bleeding  . APPENDECTOMY    . CATARACT EXTRACTION    . ELECTROPHYSIOLOGIC STUDY N/A 11/15/2015   Procedure: CARDIOVERSION;  Surgeon: Minna Merritts, MD;  Location: [facility] ORS;  Service: Cardiovascular;  Laterality: N/A;  . TONSILLECTOMY      Prior to Admission medications   Medication Sig Start Date End Date Taking? Authorizing Provider  albuterol (PROVENTIL) (2.5 MG/3ML) 0.083% nebulizer solution Take 3 mLs (2.5 mg total) by nebulization every 4 (four) hours as needed for up to 12 doses for wheezing or shortness of breath. 05/14/20   Naaman Plummer, MD  albuterol (VENTOLIN HFA) 108 (90 Base) MCG/ACT inhaler Inhale 2 puffs into the lungs every 6 (six) hours as needed for wheezing or shortness of breath. 05/14/20   Naaman Plummer, MD  amiodarone (PACERONE) 200 MG tablet TAKE ONE TABLET EVERY DAY 04/12/20   Minna Merritts, MD  apixaban (ELIQUIS) 5 MG TABS tablet Take 1 tablet (5 mg total) by mouth 2 (two) times daily. 03/26/20   Minna Merritts, MD  budesonide-formoterol (SYMBICORT) 160-4.5 MCG/ACT inhaler Inhale 2 puffs into the lungs 2 (two) times daily. 03/05/20   Birdie Sons, MD  Calcium Carbonate-Vitamin D (CALCIUM 600+D) 600-400 MG-UNIT tablet Take 1 tablet by mouth daily.    [provider]  cholecalciferol (VITAMIN D) 1000 UNITS tablet Take 1,000 Units by mouth daily.    [provider]  diltiazem (CARDIZEM CD) 180 MG 24 hr capsule TAKE 1 CAPSULE EVERY  DAY Patient taking differently: Take 180 mg by mouth daily.  08/26/19   Birdie Sons, MD  dorzolamide (TRUSOPT) 2 % ophthalmic solution Place 1 drop into both eyes 2 (two) times daily.     [provider]  doxycycline (VIBRAMYCIN) 50 MG capsule Take 2 capsules (100 mg total) by mouth 2 (two) times daily for 5 days. 05/14/20 05/19/20  Naaman Plummer, MD  gabapentin (NEURONTIN) 300 MG capsule TAKE 1 CAPSULE EVERY MORNING AND TAKE 2 CAPSULES AT BEDTIME 04/16/20   Birdie Sons, MD  hydrochlorothiazide (MICROZIDE) 12.5 MG capsule TAKE 1 CAPSULE BY MOUTH EVERY DAY 04/12/20   Birdie Sons, MD  latanoprost (XALATAN) 0.005 % ophthalmic solution Place 1 drop into both eyes at bedtime.     [provider]  levalbuterol Penne Lash HFA) 45 MCG/ACT inhaler Inhale 2 puffs into the lungs every 4 (four) hours as needed for wheezing. 02/15/20 02/14/21  Swayze, Ava, DO  magnesium oxide (MAG-OX) 400 MG tablet Take 400 mg by mouth daily.    [provider]  Omega-3 Fatty Acids (FISH OIL BURP-LESS) 1000 MG CAPS Take 1,000 mg by mouth daily.     [provider]  potassium chloride (KLOR-CON) 10 MEQ tablet Take 1 tablet (10 mEq total) by mouth daily. 02/16/20   Swayze, Ava, DO  predniSONE (DELTASONE) 20 MG tablet Take 2 tablets (40 mg total) by mouth daily with breakfast for 5 days. 05/10/20 05/15/20  Trinna Post, PA-C  vitamin C (ASCORBIC ACID) 500 MG tablet Take 1,000 mg by mouth daily.    [provider]  vitamin E 100 UNIT capsule Take 200 Units by mouth daily.    [provider]    Allergies Shellfish allergy, Albuterol, Azithromycin, and Tamiflu [oseltamivir phosphate]  Family History  Problem Relation Age of Onset  . CAD Mother   . CAD Father   . Cancer Son 70       lung cancer    Social History Social History   Tobacco Use  . Smoking status: Never Smoker  . Smokeless tobacco: Never Used  Vaping Use  . Vaping Use: Never used  Substance Use Topics  . Alcohol use: No  . Drug use: Yes    Types: Nitrous oxide    Review of Systems Constitutional: No fever/chills Eyes: No visual changes. ENT: No sore throat. Cardiovascular: Denies chest pain. Respiratory: Endorses shortness of breath. Gastrointestinal: No abdominal pain.  No nausea, no vomiting.  No diarrhea. Genitourinary: Negative for dysuria. Musculoskeletal: Negative for acute arthralgias Skin: Negative for rash. Neurological: Negative for headaches,  weakness/numbness/paresthesias in any extremity Psychiatric: Negative for suicidal ideation/homicidal ideation   ____________________________________________   PHYSICAL EXAM:  VITAL SIGNS: ED Triage Vitals  Enc Vitals Group     BP 05/14/20 0916 (!) 179/64     Pulse Rate 05/14/20 0916 76     Resp 05/14/20 0916 20     Temp 05/14/20 0916 97.8 F (36.6 C)     Temp Source 05/14/20 0916 Oral     SpO2 05/14/20 0916 98 %     Weight 05/14/20 0917 135 lb (61.2 kg)     Height 05/14/20 0917 5\' 5"  (1.651 m)     Head Circumference --      Peak Flow --      Pain Score 05/14/20 0931 0     Pain Loc --      Pain Edu? --      Excl. in [place]? --  Constitutional: Alert and oriented. Well appearing and in no acute distress. Eyes: Conjunctivae are normal. PERRL. Head: Atraumatic. Nose: No congestion/rhinnorhea. Mouth/Throat: Mucous membranes are moist. Neck: No stridor Cardiovascular: Grossly normal heart sounds.  Good peripheral circulation. Respiratory: Normal respiratory effort.  No retractions.  End expiratory wheezes over bilateral lung fields Gastrointestinal: Soft and nontender. No distention. Musculoskeletal: No obvious deformities Neurologic:  Normal speech and language. No gross focal neurologic deficits are appreciated. Skin:  Skin is warm and dry. No rash noted. Psychiatric: Mood and affect are normal. Speech and behavior are normal.  ____________________________________________   LABS (all labs ordered are listed, but only abnormal results are displayed)  Labs Reviewed  CBC WITH DIFFERENTIAL/PLATELET - Abnormal; Notable for the following components:      Result Value   WBC 12.6 (*)    MCV 74.8 (*)    MCH 24.0 (*)    RDW 15.8 (*)    Neutro Abs 11.3 (*)    Lymphs Abs 0.6 (*)    Abs Immature Granulocytes 0.09 (*)    All other components within normal limits  COMPREHENSIVE METABOLIC PANEL - Abnormal; Notable for the following components:   Sodium 133 (*)    Chloride 96  (*)    Glucose, Bld 129 (*)    Calcium 8.7 (*)    All other components within normal limits   ____________________________________________  EKG  ED ECG REPORT I, Naaman Plummer, the attending physician, personally viewed and interpreted this ECG.  Date: 05/14/2020 EKG Time: 0913 Rate: 75 Rhythm: normal sinus rhythm QRS Axis: normal Intervals: normal ST/T Wave abnormalities: normal Narrative Interpretation: no evidence of acute ischemia  ____________________________________________  RADIOLOGY  ED MD interpretation: 2 view chest x-ray shows slight pulmonary vascular congestion with bronchitic changes concerning for likely bronchitis in the setting of a COPD exacerbation  Official radiology report(s): DG Chest 2 View  Result Date: 05/14/2020 CLINICAL DATA:  Shortness of breath, productive cough, symptoms since last Thursday, has had antibiotics and prednisone EXAM: CHEST - 2 VIEW COMPARISON:  02/14/2020 FINDINGS: Mild enlargement of cardiac silhouette with slight pulmonary vascular congestion. Mediastinal contours normal. Interstitial thickening at the lateral aspects of the lung bases bilaterally unchanged. Chronic peribronchial thickening, mild hyperinflation, and slight accentuation of RIGHT upper lobe markings unchanged. No acute pulmonary infiltrate, pleural effusion or pneumothorax. Osseous structures unremarkable. IMPRESSION: Enlargement of cardiac silhouette with slight pulmonary vascular congestion. Bronchitic changes with predominantly basilar scarring. No acute abnormalities. Electronically Signed   By: Lavonia Dana M.D.   On: 05/14/2020 10:01    ____________________________________________   PROCEDURES  Procedure(s) performed (including Critical Care):  .1-3 Lead EKG Interpretation Performed by: Naaman Plummer, MD Authorized by: Naaman Plummer, MD     Interpretation: normal     ECG rate:  72   ECG rate assessment: normal     Rhythm: sinus rhythm     Ectopy:  none     Conduction: normal       ____________________________________________   INITIAL IMPRESSION / ASSESSMENT AND PLAN / ED COURSE  As part of my medical decision making, I reviewed the following data within the [place] notes reviewed and incorporated, Labs reviewed, EKG interpreted, Old chart reviewed, Radiograph reviewed and Notes from prior ED visits reviewed and incorporated       86 year old female presents for continued shortness of breath and cough for the last 5 days. The patient appears to be suffering from a moderate exacerbation of COPD.  Based  on the history, exam, CXR/EKG, and further workup I dont suspect any other emergent cause of this presentation, such as pneumonia, acute coronary syndrome, congestive heart failure, pulmonary embolism, or pneumothorax.  ED Interventions: bronchodilators, steroids, antibiotics, reassess  1758 reassessment: After treatment, the patients shortness of breath is resolved, and their lung exam has returned to baseline. They are comfortable and want to go home.  Rx: Steroids, Antibiotics, Albuterol Disposition: Discharge home with SRP. PCP follow up recommended in next 48hours.      ____________________________________________   FINAL CLINICAL IMPRESSION(S) / ED DIAGNOSES  Final diagnoses:  COPD exacerbation ([place])  Shortness of breath     ED Discharge Orders         Ordered    doxycycline (VIBRAMYCIN) 50 MG capsule  2 times daily        05/14/20 1802    albuterol (VENTOLIN HFA) 108 (90 Base) MCG/ACT inhaler  Every 6 hours PRN        05/14/20 1802    albuterol (PROVENTIL) (2.5 MG/3ML) 0.083% nebulizer solution  Every 4 hours PRN        05/14/20 1802           Note:  This document was prepared using Dragon voice recognition software and may include unintentional dictation errors.   Naaman Plummer, MD 05/14/20 [phone]

## 2020-05-14 NOTE — ED Triage Notes (Signed)
Pt presents to ED via POV with c/o SOB, pt states has taken 3 days abx and 4 days prednisone Pt states symtpoms started last Thursday. Pt states was called in scripts by PCP. Pt with mild dyspnea with speaking, but able to speak in full and complete sentences at this time. Pt A&O x4, pt states hx of pneumonia. Strong and congested cough noted in triage.

## 2020-05-14 NOTE — Telephone Encounter (Signed)
Patient is calling to report- she was seen last week for respiratory symptoms. She is currently being treated with prednisone and antibiotic. She states she got worse last night and she is having increased SOB today. Call to office-no open appointments - advised ED. Patient advised and she will go.  Reason for Disposition . [1] MODERATE difficulty breathing (e.g., speaks in phrases, SOB even at rest, pulse 100-120) AND [2] NEW-onset or WORSE than normal  Answer Assessment - Initial Assessment Questions 1. RESPIRATORY STATUS: "Describe your breathing?" (e.g., wheezing, shortness of breath, unable to speak, severe coughing)      SOB 2. ONSET: "When did this breathing problem begin?"      Last week- Thursday- worse yesterday 3. PATTERN "Does the difficult breathing come and go, or has it been constant since it started?"      today 4. SEVERITY: "How bad is your breathing?" (e.g., mild, moderate, severe)    - MILD: No SOB at rest, mild SOB with walking, speaks normally in sentences, can lay down, no retractions, pulse < 100.    - MODERATE: SOB at rest, SOB with minimal exertion and prefers to sit, cannot lie down flat, speaks in phrases, mild retractions, audible wheezing, pulse 100-120.    - SEVERE: Very SOB at rest, speaks in single words, struggling to breathe, sitting hunched forward, retractions, pulse > 120      moderate 5. RECURRENT SYMPTOM: "Have you had difficulty breathing before?" If Yes, ask: "When was the last time?" and "What happened that time?"      Asthma hx,COPD- under trestment 6. CARDIAC HISTORY: "Do you have any history of heart disease?" (e.g., heart attack, angina, bypass surgery, angioplasty)      Afib history 7. LUNG HISTORY: "Do you have any history of lung disease?"  (e.g., pulmonary embolus, asthma, emphysema)     Asthma, COPD 8. CAUSE: "What do you think is causing the breathing problem?"     COPD- exacerbation  9. OTHER SYMPTOMS: "Do you have any other symptoms?  (e.g., dizziness, runny nose, cough, chest pain, fever)     Cough-yellow 10. PREGNANCY: "Is there any chance you are pregnant?" "When was your last menstrual period?"       n/a 11. TRAVEL: "Have you traveled out of the country in the last month?" (e.g., travel history, exposures)       No- no exposure  Protocols used: BREATHING DIFFICULTY-A-AH

## 2020-05-31 ENCOUNTER — Telehealth: Payer: Self-pay | Admitting: Cardiovascular Disease

## 2020-05-31 NOTE — Telephone Encounter (Signed)
Patient daughter calling back States BP is 151/62 HR 75 Oxygen 97 and pulse 64

## 2020-05-31 NOTE — Telephone Encounter (Signed)
STAT if patient feels like he/she is going to faint   1) Are you dizzy now? Last night she was, daughter is 3 hours away. States patient is "unsteady" when she gets up  2) Do you feel faint or have you passed out? no  3) Do you have any other symptoms? Dizziness, unsteady, also has COPD=SOB  4) Have you checked your HR and BP (record if available)? No

## 2020-05-31 NOTE — Telephone Encounter (Signed)
Spoke to patient. Last night she had a dizzy spell. She makes sure to get up slowly when changing positions. Then as she takes a few steps, she will feel dizzy.  She always makes sure to hold on to something as she's up walking around. It happened last night and once day last week. It does not occur everyday.  This morning VSS - BP 151/62, HR 75, Oxygen 97 %. Denies dizziness, shortness of breath, chest pain today.  Advised her to stay hydrated (which she reports she does) and to continue to monitor symptoms and let us know if it occurs again before the appointment. She is scheduled to see Christell Faith, PA-C on this Monday, 06/04/20. She will update her daughter as well.

## 2020-06-03 NOTE — Telephone Encounter (Signed)
Will need to determine if vertigo vs orthostasis vs arrhythmia vs gait instability

## 2020-06-03 NOTE — Progress Notes (Signed)
Cardiology Office Note    Date:  06/04/2020   ID:  Molly, Hodges 01/21/1932, MRN [id number]  PCP:  Birdie Sons, MD  Cardiologist:  No primary care provider on file.  Electrophysiologist:  None   Chief Complaint: Dizziness  History of Present Illness:   Molly Hodges is a 86 y.o. female with history of PAF on Eliquis s/p DCCV 10/2015, mild mitral regurgitation, COPD, asthma, PNA, and HTN who presents for evaluation of dizziness.   She was admitted in the hospital in 08/2015 for PNA and found to be in new onset Afib with RVR. CHADS2VASc of 4 (HTN, age x 2, female). She was placed on Eliquis and rate controlled with calcium channel blockers (attempted to avoid beta blockers given her asthma). Echo at that time showed an EF of 60-65%, normal wall motion, mild MR, mildly dilated left atrium measuring 40 mm, RVSF normal, PASP normal. She ultimately underwent successful DCCV on 11/15/2015 after she had been adequately anticoagulated.  She was admitted to the hospital in 01/2020 with COPD exacerbation and treated with IV steroids and nebs.  She was seen in the ED on 05/14/2020 with suspected COPD exacerbation after having previously been placed on prednisone and amoxicillin by PCP. CXR showed chronic bronchitic changes with basilar scarring and possibly pulmonary vascular congestion. She was treated with steroids, nebs, and antibiotics with improvement in symptoms.   She contacted our office on 05/31/2020, noting dizziness with stable vitals reported to triage nurse. In this setting, appointment was made for today.   She comes in today accompanied by her daughter-in-law.  She reported an isolated episode of significant dizziness approximately 2 weeks prior which persisted for several hours and spontaneously resolved without intervention.  Since then she has been back to her usual state of health up until last week when again she began to develop randomly occurring intermittent  episodes of dizziness not associated with positional changes or exertion.  She does have a history of vertigo though these did not feel similar to this.  No associated chest pain, dyspnea, palpitations, presyncope, or syncope.  These episodes have affected her gait and ability to rotate to the left or right for fear of falling.  She reports adequate hydration and no recent changes in diet or recent illnesses.  She reports adherence to Eliquis and denies any falls, hematochezia, or melena.  Of note, when she was documented to be in A. fib in 2017 she was unable to feel tachypalpitations with this.   Labs independently reviewed: 04/2020 - potassium 3.5, BUN 13, SCr 0.75, albumin 4.0, AST/ALT normal, HGB 12.2, PLT 297 11/2019 - TSH 5.810 06/2019 - TC 208, TG 102, HDL 55, LDL 135  Past Medical History:  Diagnosis Date   Asthma    Community acquired pneumonia    COPD (chronic obstructive pulmonary disease) ([place])    Essential hypertension    Glaucoma    Mitral regurgitation    a. TTE 08/2015: EF 60-65%, normal wall motion, mild MR, mildly dilated left atrium measuring 40 mm, RVSF normal, PASP normal   PAF (paroxysmal atrial fibrillation) ([place]) 09/17/2015   a. s/p DCCV 11/15/2015; CHADS2VASc => 4 (HTN, age x 2, female); c. on Eliquis    Past Surgical History:  Procedure Laterality Date   ABDOMINAL HYSTERECTOMY  1987   due to heavy bleeding   APPENDECTOMY     CATARACT EXTRACTION     ELECTROPHYSIOLOGIC STUDY N/A 11/15/2015   Procedure: CARDIOVERSION;  Surgeon: Kathlene November  Rockey Situ, MD;  Location: [facility] ORS;  Service: Cardiovascular;  Laterality: N/A;   TONSILLECTOMY      Current Medications: Current Meds  Medication Sig   albuterol (PROVENTIL) (2.5 MG/3ML) 0.083% nebulizer solution Take 3 mLs (2.5 mg total) by nebulization every 4 (four) hours as needed for up to 12 doses for wheezing or shortness of breath.   albuterol (VENTOLIN HFA) 108 (90 Base) MCG/ACT inhaler Inhale 2 puffs into  the lungs every 6 (six) hours as needed for wheezing or shortness of breath.   amiodarone (PACERONE) 200 MG tablet TAKE ONE TABLET EVERY DAY   apixaban (ELIQUIS) 5 MG TABS tablet Take 1 tablet (5 mg total) by mouth 2 (two) times daily.   budesonide-formoterol (SYMBICORT) 160-4.5 MCG/ACT inhaler Inhale 2 puffs into the lungs 2 (two) times daily.   Calcium Carbonate-Vitamin D (CALCIUM 600+D) 600-400 MG-UNIT tablet Take 1 tablet by mouth daily.   cholecalciferol (VITAMIN D) 1000 UNITS tablet Take 1,000 Units by mouth daily.   diltiazem (CARDIZEM CD) 180 MG 24 hr capsule TAKE 1 CAPSULE EVERY DAY (Patient taking differently: Take 180 mg by mouth daily. )   dorzolamide (TRUSOPT) 2 % ophthalmic solution Place 1 drop into both eyes 2 (two) times daily.    gabapentin (NEURONTIN) 300 MG capsule TAKE 1 CAPSULE EVERY MORNING AND TAKE 2 CAPSULES AT BEDTIME   hydrochlorothiazide (MICROZIDE) 12.5 MG capsule TAKE 1 CAPSULE BY MOUTH EVERY DAY   latanoprost (XALATAN) 0.005 % ophthalmic solution Place 1 drop into both eyes at bedtime.    levalbuterol (XOPENEX HFA) 45 MCG/ACT inhaler Inhale 2 puffs into the lungs every 4 (four) hours as needed for wheezing.   magnesium oxide (MAG-OX) 400 MG tablet Take 400 mg by mouth daily.   Omega-3 Fatty Acids (FISH OIL BURP-LESS) 1000 MG CAPS Take 1,000 mg by mouth daily.    potassium chloride (KLOR-CON) 10 MEQ tablet Take 1 tablet (10 mEq total) by mouth daily.   vitamin C (ASCORBIC ACID) 500 MG tablet Take 1,000 mg by mouth daily.   vitamin E 100 UNIT capsule Take 200 Units by mouth daily.    Allergies:   Shellfish allergy, Albuterol, Azithromycin, and Tamiflu [oseltamivir phosphate]   Social History   Socioeconomic History   Marital status: Married    Spouse name: Not on file   Number of children: 4   Years of education: Not on file   Highest education level: 12th grade  Occupational History   Occupation: retired  Tobacco Use   Smoking  status: Never Smoker   Smokeless tobacco: Never Used  Scientific laboratory technician Use: Never used  Substance and Sexual Activity   Alcohol use: No   Drug use: Yes    Types: Nitrous oxide   Sexual activity: Not on file  Other Topics Concern   Not on file  Social History Narrative   Lives at home with Husband. Active and Independent at baseline.   Social Determinants of Health   Financial Resource Strain: Low Risk    Difficulty of Paying Living Expenses: Not hard at all  Food Insecurity: No Food Insecurity   Worried About Charity fundraiser in the Last Year: Never true   [place] in the Last Year: Never true  Transportation Needs: No Transportation Needs   Lack of Transportation (Medical): No   Lack of Transportation (Non-Medical): No  Physical Activity: Inactive   Days of Exercise per Week: 0 days   Minutes of Exercise per Session: 0  min  Stress: No Stress Concern Present   Feeling of Stress : Not at all  Social Connections: Moderately Integrated   Frequency of Communication with Friends and Family: More than three times a week   Frequency of Social Gatherings with Friends and Family: More than three times a week   Attends Religious Services: More than 4 times per year   Active Member of [employer] or Organizations: No   Attends Archivist Meetings: Never   Marital Status: Married     Family History:  The patient's family history includes CAD in her father and mother; Cancer (age of onset: 43) in her son.  ROS:   Review of Systems  Constitutional: Positive for malaise/fatigue. Negative for chills, diaphoresis, fever and weight loss.  HENT: Negative for congestion.   Eyes: Negative for discharge and redness.  Respiratory: Negative for cough, sputum production, shortness of breath and wheezing.   Cardiovascular: Negative for chest pain, palpitations, orthopnea, claudication, leg swelling and PND.  Gastrointestinal: Negative for abdominal pain,  blood in stool, heartburn, melena, nausea and vomiting.  Musculoskeletal: Negative for falls and myalgias.  Skin: Negative for rash.  Neurological: Positive for dizziness. Negative for tingling, tremors, sensory change, speech change, focal weakness, loss of consciousness and weakness.  Endo/Heme/Allergies: Does not bruise/bleed easily.  Psychiatric/Behavioral: Negative for substance abuse. The patient is not nervous/anxious.   All other systems reviewed and are negative.    EKGs/Labs/Other Studies Reviewed:    Studies reviewed were summarized above. The additional studies were reviewed today:  2D echo 02/2016: - Procedure narrative: Transthoracic echocardiography. Image  quality was poor. The study was technically difficult, as a  result of poor acoustic windows and poor sound wave transmission.  - Left ventricle: The cavity size was normal. Systolic function was  normal. The estimated ejection fraction was in the range of 60%  to 65%. Wall motion was normal; there were no regional wall  motion abnormalities. The study is not technically sufficient to  allow evaluation of LV diastolic function.  - Mitral valve: There was mild regurgitation.  - Left atrium: The atrium was mildly dilated.  - Right ventricle: Systolic function was normal.  - Pulmonary arteries: Systolic pressure was within the normal  range.   Impressions:   - Rhythm is atrial fibrillation.   EKG:  EKG is ordered today.  The EKG ordered today demonstrates NSR, 65 bpm, baseline wandering, no acute ST-T changes, no significant changes when compared to prior tracing  Recent Labs: 11/30/2019: TSH 5.810 05/14/2020: ALT 42; BUN 13; Creatinine, Ser 0.75; Hemoglobin 12.2; Platelets 297; Potassium 3.5; Sodium 133  Recent Lipid Panel    Component Value Date/Time   CHOL 208 (H) 07/11/2019 1120   TRIG 102 07/11/2019 1120   HDL 55 07/11/2019 1120   CHOLHDL 3.8 07/11/2019 1120   LDLCALC 135 (H) 07/11/2019  1120    PHYSICAL EXAM:    VS:  BP 136/60 (BP Location: Right Arm, Patient Position: Sitting, Cuff Size: Normal)    Pulse 66    Ht 5\' 5"  (1.651 m)    Wt 134 lb (60.8 kg)    SpO2 98%    BMI 22.30 kg/m   BMI: Body mass index is 22.3 kg/m.  Physical Exam Constitutional:      Appearance: She is well-developed.  HENT:     Head: Normocephalic and atraumatic.  Eyes:     General:        Right eye: No discharge.  Left eye: No discharge.  Neck:     Vascular: No JVD.  Cardiovascular:     Rate and Rhythm: Normal rate and regular rhythm.     Pulses: No midsystolic click and no opening snap.          Carotid pulses are on the left side with bruit.      Posterior tibial pulses are 2+ on the right side and 2+ on the left side.     Heart sounds: Normal heart sounds, S1 normal and S2 normal. Heart sounds not distant. No murmur heard.  No friction rub.  Pulmonary:     Effort: Pulmonary effort is normal. No respiratory distress.     Breath sounds: Normal breath sounds. No decreased breath sounds, wheezing or rales.  Chest:     Chest wall: No tenderness.  Abdominal:     General: There is no distension.     Palpations: Abdomen is soft.     Tenderness: There is no abdominal tenderness.  Musculoskeletal:     Cervical back: Normal range of motion.     Comments: 5 out of 5 strength along the bilateral upper and lower extremities.  Skin:    General: Skin is warm and dry.     Nails: There is no clubbing.  Neurological:     Mental Status: She is alert and oriented to person, place, and time.  Psychiatric:        Speech: Speech normal.        Behavior: Behavior normal.        Thought Content: Thought content normal.        Judgment: Judgment normal.     Wt Readings from Last 3 Encounters:  06/04/20 134 lb (60.8 kg)  05/14/20 135 lb (61.2 kg)  03/05/20 143 lb (64.9 kg)      ASSESSMENT & PLAN:   1. Dizziness: Orthostatics negative.  She does have a history of vertigo though reports  current symptoms do not feel similar to her prior vertigo and are not described as vertigo-like.  Schedule head CT to evaluate for CVA.  With noted left carotid bruit on exam and schedule carotid artery ultrasound.  Place Zio patch.  Trial of meclizine 12.5 mg twice daily as needed.  Recommend adequate hydration and slow positional changes.    2. PAF: No symptoms concerning for palpitations though she was asymptomatic with her documented episode of A. fib in 2017.  Currently maintaining sinus rhythm.  She remains on amiodarone and diltiazem.  Given a CHA2DS2-VASc of at least 4 she remains on Eliquis.  She does not currently meet reduced dosing criteria though with any weight loss moving forward this certainly may be the case and in this setting consideration for transitioning to renally dosed Xarelto should be considered.  This will be deferred to her primary cardiologist.  No recent falls or concerns for bleeding.  Stable hemoglobin obtained last month.  3. Left-sided carotid bruit: Schedule carotid artery ultrasound.  4. HTN: Blood pressure is well controlled.  Orthostatics negative.  She remains on Cardizem CD and HCTZ with potassium repletion.  Disposition: F/u with Dr. Rockey Situ or an APP in 4 to 6 weeks.   Medication Adjustments/Labs and Tests Ordered: Current medicines are reviewed at length with the patient today.  Concerns regarding medicines are outlined above. Medication changes, Labs and Tests ordered today are summarized above and listed in the Patient Instructions accessible in Encounters.   SignedChristell Faith, PA-C 06/04/2020 3:46 PM  [address] [place] [place], [place] [address] [phone]

## 2020-06-04 ENCOUNTER — Other Ambulatory Visit: Payer: Self-pay

## 2020-06-04 ENCOUNTER — Ambulatory Visit: Payer: Medicare Other | Admitting: Physician Assistant

## 2020-06-04 ENCOUNTER — Ambulatory Visit (INDEPENDENT_AMBULATORY_CARE_PROVIDER_SITE_OTHER): Payer: Medicare Other

## 2020-06-04 ENCOUNTER — Encounter: Payer: Self-pay | Admitting: Physician Assistant

## 2020-06-04 VITALS — BP 136/60 | HR 66 | Ht 65.0 in | Wt 134.0 lb

## 2020-06-04 DIAGNOSIS — R0989 Other specified symptoms and signs involving the circulatory and respiratory systems: Secondary | ICD-10-CM

## 2020-06-04 DIAGNOSIS — R42 Dizziness and giddiness: Secondary | ICD-10-CM

## 2020-06-04 DIAGNOSIS — I480 Paroxysmal atrial fibrillation: Secondary | ICD-10-CM | POA: Diagnosis not present

## 2020-06-04 DIAGNOSIS — I10 Essential (primary) hypertension: Secondary | ICD-10-CM

## 2020-06-04 MED ORDER — MECLIZINE HCL 12.5 MG PO TABS: 12.5000 mg | ORAL_TABLET | Freq: Two times a day (BID) | ORAL | 0 refills | Status: DC | PRN

## 2020-06-04 NOTE — Patient Instructions (Addendum)
Medication Instructions:  Start Antivert 12.5 mg take one tablet twice daily as needed for dizziness/vertigo.    *If you need a refill on your cardiac medications before your next appointment, please call your pharmacy*   Lab Work: NONE   If you have labs (blood work) drawn today and your tests are completely normal, you will receive your results only by: Marland Kitchen MyChart Message (if you have MyChart) OR . A paper copy in the mail If you have any lab test that is abnormal or we need to change your treatment, we will call you to review the results.   Testing/Procedures: HEAD CT without Contrast  Please arrive at 12:45 on Wednesday, June 06, 2020 at [facility]  [address] [place], [place], [place] [address] [phone]  Your physician has requested that you have an echocardiogram. Echocardiography is a painless test that uses sound waves to create images of your heart. It provides your doctor with information about the size and shape of your heart and how well your heart's chambers and valves are working. This procedure takes approximately one hour. There are no restrictions for this procedure.  Your physician has requested that you have a carotid duplex. This test is an ultrasound of the carotid arteries in your neck. It looks at blood flow through these arteries that supply the brain with blood. Allow one hour for this exam. There are no restrictions or special instructions.  Your physician has recommended that you wear a Zio monitor. This monitor is a medical device that records the heart's electrical activity. Doctors most often use these monitors to diagnose arrhythmias. Arrhythmias are problems with the speed or rhythm of the heartbeat. The monitor is a small device applied to your chest. You can wear one while you do your normal daily activities. While wearing this monitor if you have any symptoms to push the button and record what you felt. Once you have worn this  monitor for the period of time provider prescribed (Usually 14 days), you will return the monitor device in the postage paid box. Once it is returned they will download the data collected and provide [place] with a report which the provider will then review and we will call you with those results. Important tips:  1. Avoid showering during the first 24 hours of wearing the monitor. 2. Avoid excessive sweating to help maximize wear time. 3. Do not submerge the device, no hot tubs, and no swimming pools. 4. Keep any lotions or oils away from the patch. 5. After 24 hours you may shower with the patch on. Take brief showers with your back facing the shower head.  6. Do not remove patch once it has been placed because that will interrupt data and decrease adhesive wear time. 7. Push the button when you have any symptoms and write down what you were feeling. 8. Once you have completed wearing your monitor, remove and place into box which has postage paid and place in your outgoing mailbox.  9. If for some reason you have misplaced your box then call our office and we can provide another box and/or mail it off for you.         Follow-Up: At [facility], you and your health needs are our priority.  As part of our continuing mission to provide you with exceptional heart care, we have created designated Provider Care Teams.  These Care Teams include your primary Cardiologist (physician) and Advanced Practice Providers (APPs -  Physician  Assistants and Nurse Practitioners) who all work together to provide you with the care you need, when you need it.  We recommend signing up for the patient portal called "MyChart".  Sign up information is provided on this After Visit Summary.  MyChart is used to connect with patients for Virtual Visits (Telemedicine).  Patients are able to view lab/test results, encounter notes, upcoming appointments, etc.  Non-urgent messages can be sent to your provider as well.   To  learn more about what you can do with MyChart, go to [email or web address].    Your next appointment:  5-6 weeks after the [facility] monitor.    The format for your next appointment:   In Person  Provider:   You may see one of the following Advanced Practice Providers on your designated Care Team:    Murray Hodgkins, NP  Christell Faith, PA-C  Marrianne Mood, PA-C  Cadence Kathlen Mody, [place]    Other Instructions: Please contact our office with the correct dose of Potassium you are taking.

## 2020-06-05 ENCOUNTER — Telehealth: Payer: Self-pay | Admitting: Cardiovascular Disease

## 2020-06-05 DIAGNOSIS — I10 Essential (primary) hypertension: Secondary | ICD-10-CM

## 2020-06-05 DIAGNOSIS — I480 Paroxysmal atrial fibrillation: Secondary | ICD-10-CM

## 2020-06-05 DIAGNOSIS — Z79899 Other long term (current) drug therapy: Secondary | ICD-10-CM

## 2020-06-05 MED ORDER — POTASSIUM CHLORIDE ER 10 MEQ PO TBCR
10.0000 meq | EXTENDED_RELEASE_TABLET | Freq: Every day | ORAL | 1 refills | Status: DC
Start: 2020-06-05 — End: 2020-10-15

## 2020-06-05 NOTE — Telephone Encounter (Signed)
Please call to discuss Potassium . States she only takes this medication when she takes a fluid medication, and patient states she does not take a fluid medication.

## 2020-06-05 NOTE — Telephone Encounter (Signed)
Please have her start KCl 10 mEq daily since she is on HCTZ. Recheck BMET 1-2 weeks after starting KCl to ensure stable potassium.

## 2020-06-05 NOTE — Telephone Encounter (Signed)
Patient verbalized understanding of plan of care to start potassium 10 mEq once a day and to get lab work in 1-2 weeks at [facility].

## 2020-06-05 NOTE — Telephone Encounter (Signed)
Spoke with patient and she reports that she has not been taking her potassium for a long time. This is because she has not been taking a fluid pill. Reviewed that she is taking HCTZ and that is a fluid pill so she was on potassium for that. She did not realize that and it appears her PCP started that. Recommended that she call them for further recommendations with her potassium pill but she requested for provider review here if at all possible. She states that she has a hard time reaching anyone there. Advised I would be in touch with any recommendations. She was appreciative for the call with no further questions at this time.

## 2020-06-06 ENCOUNTER — Other Ambulatory Visit: Payer: Self-pay

## 2020-06-06 ENCOUNTER — Other Ambulatory Visit: Payer: Self-pay | Admitting: Cardiovascular Disease

## 2020-06-06 ENCOUNTER — Other Ambulatory Visit: Payer: Self-pay | Admitting: Family Medicine

## 2020-06-06 ENCOUNTER — Ambulatory Visit
Admission: RE | Admit: 2020-06-06 | Discharge: 2020-06-06 | Disposition: A | Discharge status: Home / Self Care | Payer: Medicare Other | Source: Ambulatory Visit | Attending: Physician Assistant | Admitting: Physician Assistant

## 2020-06-06 DIAGNOSIS — R42 Dizziness and giddiness: Secondary | ICD-10-CM | POA: Diagnosis not present

## 2020-06-13 ENCOUNTER — Telehealth: Payer: Self-pay | Admitting: *Deleted

## 2020-06-13 ENCOUNTER — Other Ambulatory Visit
Admission: RE | Admit: 2020-06-13 | Discharge: 2020-06-13 | Disposition: A | Discharge status: Home / Self Care | Payer: Medicare Other | Attending: Physician Assistant | Admitting: Physician Assistant

## 2020-06-13 DIAGNOSIS — I480 Paroxysmal atrial fibrillation: Secondary | ICD-10-CM

## 2020-06-13 DIAGNOSIS — I10 Essential (primary) hypertension: Secondary | ICD-10-CM | POA: Insufficient documentation

## 2020-06-13 DIAGNOSIS — Z79899 Other long term (current) drug therapy: Secondary | ICD-10-CM | POA: Diagnosis not present

## 2020-06-13 LAB — BASIC METABOLIC PANEL
Anion gap: 10 (ref 5–15)
BUN: 16 mg/dL (ref 8–23)
CO2: 25 mmol/L (ref 22–32)
Calcium: 9.2 mg/dL (ref 8.9–10.3)
Chloride: 102 mmol/L (ref 98–111)
Creatinine, Ser: 0.88 mg/dL (ref 0.44–1.00)
GFR, Estimated: >60 mL/min (ref >60)
Glucose, Bld: 93 mg/dL (ref 70–99)
Potassium: 3.8 mmol/L (ref 3.5–5.1)
Sodium: 137 mmol/L (ref 135–145)

## 2020-06-13 NOTE — Telephone Encounter (Signed)
-----   Message from Rise Mu, PA-C sent at 06/13/2020 10:09 AM EDT ----- Renal function improve. Potassium is improving and normal. She should continue KCl given she is also on HCTZ.

## 2020-06-13 NOTE — Telephone Encounter (Signed)
No answer after several rings and no VM picked up on patient's number.  No answer. Left message to call back on patient's son's number.

## 2020-06-14 NOTE — Telephone Encounter (Signed)
Patient made aware of lab results with verbalized understanding. 

## 2020-06-22 ENCOUNTER — Ambulatory Visit (INDEPENDENT_AMBULATORY_CARE_PROVIDER_SITE_OTHER): Payer: Medicare Other

## 2020-06-22 ENCOUNTER — Other Ambulatory Visit: Payer: Medicare Other

## 2020-06-22 ENCOUNTER — Other Ambulatory Visit: Payer: Self-pay

## 2020-06-22 DIAGNOSIS — R42 Dizziness and giddiness: Secondary | ICD-10-CM

## 2020-06-22 DIAGNOSIS — R0989 Other specified symptoms and signs involving the circulatory and respiratory systems: Secondary | ICD-10-CM | POA: Diagnosis not present

## 2020-06-25 ENCOUNTER — Telehealth: Payer: Self-pay | Admitting: *Deleted

## 2020-06-25 DIAGNOSIS — Z13220 Encounter for screening for lipoid disorders: Secondary | ICD-10-CM

## 2020-06-25 DIAGNOSIS — I6521 Occlusion and stenosis of right carotid artery: Secondary | ICD-10-CM

## 2020-06-25 NOTE — Telephone Encounter (Signed)
-----   Message from Rise Mu, [place] sent at 06/25/2020 10:10 AM EST ----- Carotid artery ultrasound showed significant narrowing of the right internal carotid artery along with a cystic area of the thyroid.   Recommendations: -Referral to vascular surgery -Follow up with PCP to discuss if thyroid lesion follow up is needed  -Most recent LDL of 135 with goal now being < 70 -Recommend fasting lipid panel and LFT, with recommendation to add Lipitor based on these results

## 2020-06-25 NOTE — Telephone Encounter (Signed)
Pt verbalized understanding of results and plan of care. Pt instructed to have fasting labwork done at the [facility] in the next 1-2 weeks. Lab orders placed. Referral placed to [place] pt call our office if have not received a call to schedule appt w/ [facility]. Pt verbalized understanding to contact her PCP (Dr. Caryn Section) to have appt moved sooner to discuss thyroid lesion. Pt has no further questions at this time.

## 2020-06-27 DIAGNOSIS — I480 Paroxysmal atrial fibrillation: Secondary | ICD-10-CM | POA: Diagnosis not present

## 2020-07-03 ENCOUNTER — Other Ambulatory Visit: Payer: Self-pay

## 2020-07-03 ENCOUNTER — Ambulatory Visit (INDEPENDENT_AMBULATORY_CARE_PROVIDER_SITE_OTHER): Payer: Medicare Other | Admitting: Family Medicine

## 2020-07-03 ENCOUNTER — Encounter: Payer: Self-pay | Admitting: Family Medicine

## 2020-07-03 VITALS — BP 142/90 | HR 66 | Temp 97.7°F | Resp 16 | Wt 135.0 lb

## 2020-07-03 DIAGNOSIS — G629 Polyneuropathy, unspecified: Secondary | ICD-10-CM | POA: Diagnosis not present

## 2020-07-03 DIAGNOSIS — I10 Essential (primary) hypertension: Secondary | ICD-10-CM

## 2020-07-03 DIAGNOSIS — E041 Nontoxic single thyroid nodule: Secondary | ICD-10-CM

## 2020-07-03 DIAGNOSIS — R202 Paresthesia of skin: Secondary | ICD-10-CM | POA: Diagnosis not present

## 2020-07-03 DIAGNOSIS — R42 Dizziness and giddiness: Secondary | ICD-10-CM | POA: Diagnosis not present

## 2020-07-03 DIAGNOSIS — E039 Hypothyroidism, unspecified: Secondary | ICD-10-CM

## 2020-07-03 NOTE — Progress Notes (Signed)
Established patient visit   Patient: Molly Hodges   DOB: 05/05/1932   86 y.o. Female  MRN: [id number] Visit Date: 07/03/2020  Today's healthcare provider: Lelon Huh, MD   Chief Complaint  Patient presents with  . Hypertension  . Hypothyroidism  . Chronic Kidney Disease   Subjective    HPI  Hypertension, follow-up  BP Readings from Last 3 Encounters:  07/03/20 (!) 162/72  06/04/20 136/60  05/14/20 (!) 176/63   Wt Readings from Last 3 Encounters:  07/03/20 135 lb (61.2 kg)  06/04/20 134 lb (60.8 kg)  05/14/20 135 lb (61.2 kg)     She was last seen for hypertension 4 months ago.  BP at that visit was 138/60. Management since that visit includes continue same medication.  She reports good compliance with treatment. She is not having side effects.  She is following a Regular diet. She is not exercising. She does not smoke.  Use of agents associated with hypertension: none.   Outside blood pressures are checked at home 125-145/ 58-60. Symptoms: No chest pain No chest pressure  No palpitations No syncope  No dyspnea No orthopnea  No paroxysmal nocturnal dyspnea No lower extremity edema   Pertinent labs: Lab Results  Component Value Date   CHOL 208 (H) 07/11/2019   HDL 55 07/11/2019   LDLCALC 135 (H) 07/11/2019   TRIG 102 07/11/2019   CHOLHDL 3.8 07/11/2019   Lab Results  Component Value Date   NA 137 06/13/2020   K 3.8 06/13/2020   CREATININE 0.88 06/13/2020   GFRNONAA >60 06/13/2020   GFRAA >60 05/14/2020   GLUCOSE 93 06/13/2020     The ASCVD Risk score (Goff DC Jr., et al., 2013) failed to calculate for the following reasons:   The 2013 ASCVD risk score is only valid for ages 74 to 58   ---------------------------------------------------------------------------------------------------  Hypothyroid, follow-up  Lab Results  Component Value Date   TSH 5.810 (H) 11/30/2019   TSH 4.480 07/11/2019   TSH 6.290 (H) 07/08/2018   FREET4  1.58 07/08/2018   T4TOTAL 10.8 11/13/2016    She was last seen for hypothyroid 7 months ago.  Management since that visit includes ordering labs which showed borderline hypothyroid, but not enough to require any additional medications . She reports good compliance with treatment. She is not having side effects.   Symptoms: No change in energy level No constipation  No diarrhea No heat / cold intolerance  No nervousness No palpitations  No weight changes    -----------------------------------------------------------------------------------------  She also reports episodes of dizziness, like feeling light headed, often when standing, but sometimes when sitting and walking. She was by Christell Faith with cardiology. She was not orthostatic. She had 14 day cardiac monitor which was unremarkable. She had trial of meclizine which did not seem to make a different. She had head CT with no acute findings, but there was a small calcification thought to be a meningioma of right parietal lobe. She had carotid dopplers revealing 80-99% right prox ICA stenosis, , but no stenosis on left. There was incidental finding of avascular cystic area of thyroid measuring 0.6cm x 0.5cm      Medications: Outpatient Medications Prior to Visit  Medication Sig  . albuterol (PROVENTIL) (2.5 MG/3ML) 0.083% nebulizer solution Take 3 mLs (2.5 mg total) by nebulization every 4 (four) hours as needed for up to 12 doses for wheezing or shortness of breath.  Marland Kitchen albuterol (VENTOLIN HFA) 108 (90 Base) MCG/ACT inhaler  Inhale 2 puffs into the lungs every 6 (six) hours as needed for wheezing or shortness of breath.  Marland Kitchen amiodarone (PACERONE) 200 MG tablet TAKE ONE TABLET EVERY DAY  . apixaban (ELIQUIS) 5 MG TABS tablet Take 1 tablet (5 mg total) by mouth 2 (two) times daily.  . budesonide-formoterol (SYMBICORT) 160-4.5 MCG/ACT inhaler Inhale 2 puffs into the lungs 2 (two) times daily.  . Calcium Carbonate-Vitamin D (CALCIUM 600+D)  600-400 MG-UNIT tablet Take 1 tablet by mouth daily.  . cholecalciferol (VITAMIN D) 1000 UNITS tablet Take 1,000 Units by mouth daily.  Marland Kitchen diltiazem (CARDIZEM CD) 180 MG 24 hr capsule TAKE 1 CAPSULE EVERY DAY  . dorzolamide (TRUSOPT) 2 % ophthalmic solution Place 1 drop into both eyes 2 (two) times daily.   Marland Kitchen gabapentin (NEURONTIN) 300 MG capsule TAKE 1 CAPSULE EVERY MORNING AND TAKE 2 CAPSULES AT BEDTIME  . hydrochlorothiazide (MICROZIDE) 12.5 MG capsule TAKE 1 CAPSULE BY MOUTH EVERY DAY  . latanoprost (XALATAN) 0.005 % ophthalmic solution Place 1 drop into both eyes at bedtime.   . levalbuterol (XOPENEX HFA) 45 MCG/ACT inhaler Inhale 2 puffs into the lungs every 4 (four) hours as needed for wheezing.  . magnesium oxide (MAG-OX) 400 MG tablet Take 400 mg by mouth daily.  . meclizine (ANTIVERT) 12.5 MG tablet Take 1 tablet (12.5 mg total) by mouth 2 (two) times daily as needed for dizziness.  . Omega-3 Fatty Acids (FISH OIL BURP-LESS) 1000 MG CAPS Take 1,000 mg by mouth daily.   . potassium chloride (KLOR-CON) 10 MEQ tablet Take 1 tablet (10 mEq total) by mouth daily.  . vitamin C (ASCORBIC ACID) 500 MG tablet Take 1,000 mg by mouth daily.  . vitamin E 100 UNIT capsule Take 200 Units by mouth daily.  . [DISCONTINUED] potassium chloride (KLOR-CON) 10 MEQ tablet Take 1 tablet (10 mEq total) by mouth daily.   No facility-administered medications prior to visit.    Review of Systems  Constitutional: Negative for appetite change, chills, fatigue and fever.  Respiratory: Negative for chest tightness and shortness of breath.   Cardiovascular: Negative for chest pain and palpitations.  Gastrointestinal: Negative for abdominal pain, nausea and vomiting.  Neurological: Positive for dizziness. Negative for weakness.      Objective    BP (!) 162/72 (BP Location: Right Arm, Patient Position: Sitting, Cuff Size: Normal)   Pulse 66   Temp 97.7 F (36.5 C) (Oral)   Resp 16   Wt 135 lb (61.2 kg)    BMI 22.47 kg/m    Physical Exam   General: Appearance:    Well developed, well nourished female in no acute distress  Eyes:    PERRL, conjunctiva/corneas clear, EOM's intact       Lungs:     Clear to auscultation bilaterally, respirations unlabored  Heart:    Normal heart rate. Irregularly irregular rhythm. No murmurs, rubs, or gallops.   MS:   All extremities are intact.   Neurologic:   Awake, alert, oriented x 3. No apparent focal neurological           defect.         No results found for any visits on 07/03/20.  Assessment & Plan     1. Dizziness Non-specific, recurrent and intermittent. Negative 14 day cardiac monitor. Is scheduled to see vascular regarding right ICA stenosis. Consider neurology referral, particularly for opinion regarding parietal dural calcification on recent head CT  2. Peripheral polyneuropathy On gabapenin - Vitamin B12  3. Primary  hypertension Fairly well controlled.  - Lipid panel  4. Hypothyroidism, unspecified type  - T4, free - TSH - Hepatic function panel  5. Cyst of thyroid  - T4, free - TSH - US THYROID; Future         The entirety of the information documented in the History of Present Illness, Review of Systems and Physical Exam were personally obtained by me. Portions of this information were initially documented by the CMA and reviewed by me for thoroughness and accuracy.      Lelon Huh, MD  [facility] [phone] (phone) [phone] (fax)  [place]

## 2020-07-04 ENCOUNTER — Encounter: Payer: Self-pay | Admitting: Family Medicine

## 2020-07-04 ENCOUNTER — Telehealth: Payer: Self-pay

## 2020-07-04 ENCOUNTER — Telehealth: Payer: Self-pay | Admitting: *Deleted

## 2020-07-04 DIAGNOSIS — G629 Polyneuropathy, unspecified: Secondary | ICD-10-CM

## 2020-07-04 DIAGNOSIS — D329 Benign neoplasm of meninges, unspecified: Secondary | ICD-10-CM | POA: Insufficient documentation

## 2020-07-04 DIAGNOSIS — R42 Dizziness and giddiness: Secondary | ICD-10-CM

## 2020-07-04 DIAGNOSIS — E785 Hyperlipidemia, unspecified: Secondary | ICD-10-CM

## 2020-07-04 LAB — LIPID PANEL
Chol/HDL Ratio: 3.9 ratio (ref 0.0–4.4)
Cholesterol, Total: 228 mg/dL — ABNORMAL HIGH (ref 100–199)
HDL: 59 mg/dL (ref >39)
LDL Chol Calc (NIH): 156 mg/dL — ABNORMAL HIGH (ref 0–99)
Triglycerides: 77 mg/dL (ref 0–149)
VLDL Cholesterol Cal: 13 mg/dL (ref 5–40)

## 2020-07-04 LAB — HEPATIC FUNCTION PANEL
ALT: 23 IU/L (ref 0–32)
AST: 18 IU/L (ref 0–40)
Albumin: 4.4 g/dL (ref 3.6–4.6)
Alkaline Phosphatase: 103 IU/L (ref 44–121)
Bilirubin Total: 0.3 mg/dL (ref 0.0–1.2)
Bilirubin, Direct: 0.1 mg/dL (ref 0.00–0.40)
Total Protein: 6.9 g/dL (ref 6.0–8.5)

## 2020-07-04 LAB — VITAMIN B12: Vitamin B-12: 975 pg/mL (ref 232–1245)

## 2020-07-04 LAB — T4, FREE: Free T4: 1.39 ng/dL (ref 0.82–1.77)

## 2020-07-04 LAB — TSH: TSH: 3.59 u[IU]/mL (ref 0.450–4.500)

## 2020-07-04 MED ORDER — ROSUVASTATIN CALCIUM 10 MG PO TABS: 10.0000 mg | ORAL_TABLET | Freq: Every day | ORAL | 3 refills | Status: DC

## 2020-07-04 NOTE — Telephone Encounter (Signed)
No answer after several rings. No voicemail picked up.

## 2020-07-04 NOTE — Telephone Encounter (Signed)
-----   Message from Birdie Sons, MD sent at 07/04/2020  8:02 AM EST ----- Her cholesterol is a high at 228. Considering the ultrasound of her carotids shows signs of cholesterol plaques, she needs to start rosuvastatin 10mg  once a day, #30, rf x 3. Have forwarded results to the cardiology and vascular clinics. Rest of labs are normal. Recommend referral to neurologist for dizziness and neuropathy. She also had small calcification on her head CT last month. It looks benign, but I want a neurologist to look over everything to make sure it is not related to her dizziness. Please schedule a follow up in 2 months.

## 2020-07-04 NOTE — Telephone Encounter (Signed)
-----   Message from Rise Mu, PA-C sent at 07/04/2020  1:33 PM EST ----- Heart monitor showed sinus rhythm with an average heart rate of 64 bpm.  Some episodes of slow heartbeats predominantly at nighttime as well as isolated extra beats from the top and bottom portion of the heart.  No further work-up is needed at this time.

## 2020-07-04 NOTE — Telephone Encounter (Signed)
Advised patient of results. Medication was sent into the pharmacy. Referral was placed. Appt scheduled.

## 2020-07-05 ENCOUNTER — Ambulatory Visit
Admission: RE | Admit: 2020-07-05 | Discharge: 2020-07-05 | Disposition: A | Discharge status: Home / Self Care | Payer: Medicare Other | Source: Ambulatory Visit | Attending: Family Medicine | Admitting: Family Medicine

## 2020-07-05 ENCOUNTER — Other Ambulatory Visit: Payer: Self-pay

## 2020-07-05 DIAGNOSIS — E041 Nontoxic single thyroid nodule: Secondary | ICD-10-CM | POA: Diagnosis not present

## 2020-07-05 DIAGNOSIS — E042 Nontoxic multinodular goiter: Secondary | ICD-10-CM | POA: Diagnosis not present

## 2020-07-06 ENCOUNTER — Encounter: Payer: Self-pay | Admitting: *Deleted

## 2020-07-06 NOTE — Telephone Encounter (Signed)
3rd attempt. Line busy. Letter with results mailed to patient.

## 2020-07-16 ENCOUNTER — Ambulatory Visit: Payer: Medicare Other | Admitting: Physician Assistant

## 2020-07-17 ENCOUNTER — Encounter (INDEPENDENT_AMBULATORY_CARE_PROVIDER_SITE_OTHER): Payer: Self-pay | Admitting: Vascular Surgery

## 2020-07-17 ENCOUNTER — Ambulatory Visit (INDEPENDENT_AMBULATORY_CARE_PROVIDER_SITE_OTHER): Payer: Medicare Other | Admitting: Vascular Surgery

## 2020-07-17 ENCOUNTER — Other Ambulatory Visit: Payer: Self-pay

## 2020-07-17 VITALS — BP 178/54 | HR 67 | Resp 16 | Ht 60.0 in | Wt 136.0 lb

## 2020-07-17 DIAGNOSIS — E041 Nontoxic single thyroid nodule: Secondary | ICD-10-CM

## 2020-07-17 DIAGNOSIS — I10 Essential (primary) hypertension: Secondary | ICD-10-CM

## 2020-07-17 DIAGNOSIS — G629 Polyneuropathy, unspecified: Secondary | ICD-10-CM

## 2020-07-17 DIAGNOSIS — I480 Paroxysmal atrial fibrillation: Secondary | ICD-10-CM | POA: Diagnosis not present

## 2020-07-17 DIAGNOSIS — I6529 Occlusion and stenosis of unspecified carotid artery: Secondary | ICD-10-CM | POA: Insufficient documentation

## 2020-07-17 DIAGNOSIS — I6523 Occlusion and stenosis of bilateral carotid arteries: Secondary | ICD-10-CM | POA: Diagnosis not present

## 2020-07-17 NOTE — Assessment & Plan Note (Signed)
blood pressure control important in reducing the progression of atherosclerotic disease. On appropriate oral medications.  

## 2020-07-17 NOTE — Progress Notes (Signed)
Patient ID: Molly Hodges, female   DOB: [birth year], 86 y.o.   MRN: [id number]  Chief Complaint  Patient presents with  . New Patient (Initial Visit)    ref [place] Rile carotid stenosis    HPI Molly Hodges is a 86 y.o. female.  I am asked to see the patient by Dr. Caryn Section for evaluation of carotid stenosis.  The patient reports having several dizzy episodes earlier this year.  This prompted an evaluation by her primary care physician as well as her cardiologist.  She says her heart checked out okay but when they listen to her, she had an abnormal sound in her neck which prompted a carotid ultrasound.  I have independently reviewed this carotid ultrasound and it suggests a high-grade right carotid artery stenosis.  This had velocities in the 80 to 99% range.  There is some disease on the left although it would fall in the mild range.  She has not really had much in the way of dizziness over the past few weeks.  No focal neurologic symptoms. Specifically, the patient denies amaurosis fugax, speech or swallowing difficulties, or arm or leg weakness or numbness   Past Medical History:  Diagnosis Date  . Asthma   . Community acquired pneumonia   . COPD (chronic obstructive pulmonary disease) ([place])   . Essential hypertension   . Glaucoma   . Mitral regurgitation    a. TTE 08/2015: EF 60-65%, normal wall motion, mild MR, mildly dilated left atrium measuring 40 mm, RVSF normal, PASP normal  . PAF (paroxysmal atrial fibrillation) ([place]) 09/17/2015   a. s/p DCCV 11/15/2015; CHADS2VASc => 4 (HTN, age x 2, female); c. on Eliquis    Past Surgical History:  Procedure Laterality Date  . ABDOMINAL HYSTERECTOMY  1987   due to heavy bleeding  . APPENDECTOMY    . CATARACT EXTRACTION    . ELECTROPHYSIOLOGIC STUDY N/A 11/15/2015   Procedure: CARDIOVERSION;  Surgeon: Minna Merritts, MD;  Location: [facility] ORS;  Service: Cardiovascular;  Laterality: N/A;  . TONSILLECTOMY      Family History  Problem  Relation Age of Onset  . CAD Mother   . CAD Father   . Cancer Son 27       lung cancer  no bleeding or clotting disorders   Social History   Tobacco Use  . Smoking status: Never Smoker  . Smokeless tobacco: Never Used  Vaping Use  . Vaping Use: Never used  Substance Use Topics  . Alcohol use: No  . Drug use: Yes    Types: Nitrous oxide    Allergies  Allergen Reactions  . Shellfish Allergy Anaphylaxis  . Albuterol     Heart racing.   . Azithromycin Other (See Comments)    Extreme burning sensation at IV site  . Tamiflu [Oseltamivir Phosphate] Other (See Comments)    Reaction:  Hallucinations  Reaction:  Hallucinations     Current Outpatient Medications  Medication Sig Dispense Refill  . albuterol (PROVENTIL) (2.5 MG/3ML) 0.083% nebulizer solution Take 3 mLs (2.5 mg total) by nebulization every 4 (four) hours as needed for up to 12 doses for wheezing or shortness of breath. 36 mL 0  . albuterol (VENTOLIN HFA) 108 (90 Base) MCG/ACT inhaler Inhale 2 puffs into the lungs every 6 (six) hours as needed for wheezing or shortness of breath. 8 g 2  . amiodarone (PACERONE) 200 MG tablet TAKE ONE TABLET EVERY DAY 90 tablet 0  . apixaban (ELIQUIS) 5 MG  TABS tablet Take 1 tablet (5 mg total) by mouth 2 (two) times daily. 180 tablet 3  . budesonide-formoterol (SYMBICORT) 160-4.5 MCG/ACT inhaler Inhale 2 puffs into the lungs 2 (two) times daily. 1 Inhaler 12  . Calcium Carbonate-Vitamin D (CALCIUM 600+D) 600-400 MG-UNIT tablet Take 1 tablet by mouth daily.    . cholecalciferol (VITAMIN D) 1000 UNITS tablet Take 1,000 Units by mouth daily.    Marland Kitchen diltiazem (CARDIZEM CD) 180 MG 24 hr capsule TAKE 1 CAPSULE EVERY DAY 30 capsule 11  . dorzolamide (TRUSOPT) 2 % ophthalmic solution Place 1 drop into both eyes 2 (two) times daily.     Marland Kitchen gabapentin (NEURONTIN) 300 MG capsule TAKE 1 CAPSULE EVERY MORNING AND TAKE 2 CAPSULES AT BEDTIME 90 capsule 4  . hydrochlorothiazide (MICROZIDE) 12.5 MG capsule  TAKE 1 CAPSULE BY MOUTH EVERY DAY 90 capsule 1  . latanoprost (XALATAN) 0.005 % ophthalmic solution Place 1 drop into both eyes at bedtime.     . levalbuterol (XOPENEX HFA) 45 MCG/ACT inhaler Inhale 2 puffs into the lungs every 4 (four) hours as needed for wheezing. 1 Inhaler 2  . magnesium oxide (MAG-OX) 400 MG tablet Take 400 mg by mouth daily.    . meclizine (ANTIVERT) 12.5 MG tablet Take 1 tablet (12.5 mg total) by mouth 2 (two) times daily as needed for dizziness. 60 tablet 0  . Omega-3 Fatty Acids (FISH OIL BURP-LESS) 1000 MG CAPS Take 1,000 mg by mouth daily.     . potassium chloride (KLOR-CON) 10 MEQ tablet Take 1 tablet (10 mEq total) by mouth daily. 90 tablet 1  . rosuvastatin (CRESTOR) 10 MG tablet Take 1 tablet (10 mg total) by mouth daily. 30 tablet 3  . vitamin C (ASCORBIC ACID) 500 MG tablet Take 1,000 mg by mouth daily.    . vitamin E 100 UNIT capsule Take 200 Units by mouth daily.     No current facility-administered medications for this visit.      REVIEW OF SYSTEMS (Negative unless checked)  Constitutional: [] Weight loss  [] Fever  [] Chills Cardiac: [] Chest pain   [] Chest pressure   [] Palpitations   [] Shortness of breath when laying flat   [] Shortness of breath at rest   [] Shortness of breath with exertion. Vascular:  [] Pain in legs with walking   [] Pain in legs at rest   [] Pain in legs when laying flat   [] Claudication   [] Pain in feet when walking  [] Pain in feet at rest  [] Pain in feet when laying flat   [] History of DVT   [] Phlebitis   [] Swelling in legs   [] Varicose veins   [] Non-healing ulcers Pulmonary:   [] Uses home oxygen   [] Productive cough   [] Hemoptysis   [] Wheeze  [] COPD   [] Asthma Neurologic:  [x] Dizziness  [] Blackouts   [] Seizures   [] History of stroke   [] History of TIA  [] Aphasia   [] Temporary blindness   [] Dysphagia   [] Weakness or numbness in arms   [] Weakness or numbness in legs Musculoskeletal:  [x] Arthritis   [] Joint swelling   [x] Joint pain   [] Low  back pain Hematologic:  [] Easy bruising  [] Easy bleeding   [] Hypercoagulable state   [] Anemic  [] Hepatitis Gastrointestinal:  [] Blood in stool   [] Vomiting blood  [] Gastroesophageal reflux/heartburn   [] Abdominal pain Genitourinary:  [] Chronic kidney disease   [] Difficult urination  [] Frequent urination  [] Burning with urination   [] Hematuria Skin:  [] Rashes   [] Ulcers   [] Wounds Psychological:  [] History of anxiety   []  History  of major depression.    Physical Exam BP (!) 178/54 (BP Location: Right Arm)   Pulse 67   Resp 16   Ht 5' (1.524 m)   Wt 136 lb (61.7 kg)   BMI 26.56 kg/m  Gen:  WD/WN, NAD. Appears younger than stated age. Head: [place]/AT, No temporalis wasting. Ear/Nose/Throat: Hearing grossly intact, nares w/o erythema or drainage, oropharynx w/o Erythema/Exudate Eyes: Conjunctiva clear, sclera non-icteric  Neck: trachea midline.  Right carotid bruit Pulmonary:  Good air movement, clear to auscultation bilaterally.  Cardiac: RRR, normal S1, S2 Vascular:  Vessel Right Left  Radial Palpable Palpable           Musculoskeletal: M/S 5/5 throughout.  Extremities without ischemic changes.  No deformity or atrophy. No edema. Neurologic: Sensation grossly intact in extremities.  Symmetrical.  Speech is fluent. Motor exam as listed above. Psychiatric: Judgment intact, Mood & affect appropriate for pt's clinical situation. Dermatologic: No rashes or ulcers noted.  No cellulitis or open wounds.    Radiology US THYROID  Result Date: 07/05/2020 CLINICAL DATA:  Incidental on [place]. Thyroid cyst noted by carotid ultrasound. EXAM: THYROID ULTRASOUND TECHNIQUE: Ultrasound examination of the thyroid gland and adjacent soft tissues was performed. COMPARISON:  Report from carotid ultrasound on 06/22/2020. FINDINGS: Parenchymal Echotexture: Normal Isthmus: 0.5 cm Right lobe: 4.2 x 1.8 x 1.5 cm Left lobe: 4.4 x 2.1 x 1.6 cm _________________________________________________________ Estimated  total number of nodules >/= 1 cm: 0 Number of spongiform nodules >/=  2 cm not described below (TR1): 0 Number of mixed cystic and solid nodules >/= 1.5 cm not described below (TR2): 0 _________________________________________________________ Multiple scattered bilateral subcentimeter simple cysts are noted in both lobes of the thyroid gland. The largest measures 0.8 cm on the right and 0.7 cm on the left. All of the cysts appear benign and do not meet criteria for either biopsy or further follow-up. No solid nodules are identified. No abnormal lymph nodes are seen. IMPRESSION: Benign scattered subcentimeter cysts in both lobes of the thyroid gland measuring 0.8 cm or less. The above is in keeping with the ACR TI-RADS recommendations - J Am Coll Radiol 2017;14:587-595. Electronically Signed   By: Aletta Edouard M.D.   On: 07/05/2020 16:55   VAS US CAROTID  Result Date: 06/25/2020 Carotid Arterial Duplex Study Indications:                           Left bruit. Risk Factors:                          Hypertension, no history of smoking. Other Factors:                         Distinct increase in dizziness about one                                        month ago however, the dizziness is                                        intermittent. Comparison Study:                      None Pre-Surgical  Evaluation & Surgical     Stenosis at right bifurcation only. ICA Correlation                            is normal past the stenosis. Anatomy on                                        the right is within normal limits.Right                                        bifurcation is located near the Hyoid                                        Notch. Performing Technologist: Pilar Jarvis RDMS, RVT, RDCS  Examination Guidelines: A complete evaluation includes B-mode imaging, spectral Doppler, color Doppler, and power Doppler as needed of all accessible portions of each vessel. Bilateral testing is considered an integral part of a  complete examination. Limited examinations for reoccurring indications may be performed as noted.  Right Carotid Findings: +----------+--------+--------+--------+--------------------------+--------+           PSV cm/sEDV cm/sStenosisPlaque Description        Comments +----------+--------+--------+--------+--------------------------+--------+ CCA Prox  67      11                                                 +----------+--------+--------+--------+--------------------------+--------+ CCA Distal67      11      <50%    homogeneous                        +----------+--------+--------+--------+--------------------------+--------+ ICA Prox  455     115     80-99%  irregular and heterogenous         +----------+--------+--------+--------+--------------------------+--------+ ICA Mid   232     35      40-59%                                     +----------+--------+--------+--------+--------------------------+--------+ ICA Distal107     24                                                 +----------+--------+--------+--------+--------------------------+--------+ ECA       64      6                                                  +----------+--------+--------+--------+--------------------------+--------+ +----------+--------+-------+----------------+-------------------+           PSV cm/sEDV cmsDescribe        Arm Pressure (mmHG) +----------+--------+-------+----------------+-------------------+ [id number]            Multiphasic, WNL                    +----------+--------+-------+----------------+-------------------+ +---------+--------+--+--------+-+---------+  VertebralPSV cm/s11EDV cm/s4Antegrade +---------+--------+--+--------+-+---------+ Multiple avascular cystic areas in the thyroid, the largest of which measures 0.7cm x 0.7 cm Left Carotid Findings: +----------+--------+--------+--------+-------------------+--------+           PSV cm/sEDV  cm/sStenosisPlaque Description Comments +----------+--------+--------+--------+-------------------+--------+ CCA Prox  86      14                                          +----------+--------+--------+--------+-------------------+--------+ CCA Distal121     22      <50%    calcific and smooth         +----------+--------+--------+--------+-------------------+--------+ ICA Prox  181     24      1-39%   calcific and smooth         +----------+--------+--------+--------+-------------------+--------+ ICA Mid   188     30      1-39%                               +----------+--------+--------+--------+-------------------+--------+ ICA Distal119     24                                          +----------+--------+--------+--------+-------------------+--------+ ECA       140                                                 +----------+--------+--------+--------+-------------------+--------+ +----------+--------+--------+----------------+-------------------+           PSV cm/sEDV cm/sDescribe        Arm Pressure (mmHG) +----------+--------+--------+----------------+-------------------+ Subclavian180             Multiphasic, WNL                    +----------+--------+--------+----------------+-------------------+ +---------+--------+--+--------+--+---------+ VertebralPSV cm/s87EDV cm/s17Antegrade +---------+--------+--+--------+--+---------+ Avascular cystic area in the thyroid measuring 0.6cm x 0.5 cm  Summary: Right Carotid: Velocities in the right ICA are consistent with a 80-99%                stenosis. Non-hemodynamically significant plaque <50% noted in                the CCA. The ECA appears <50% stenosed. Left Carotid: Velocities in the left ICA are consistent with a 1-39% stenosis.               Non-hemodynamically significant plaque <50% noted in the CCA. The               ECA appears <50% stenosed. Vertebrals:  Bilateral vertebral arteries demonstrate  antegrade flow. Subclavians: Normal flow hemodynamics were seen in bilateral subclavian              arteries. *See table(s) above for measurements and observations.  Electronically signed by Jenkins Rouge MD on 06/25/2020 at 7:43:18 AM.    Final     Labs Recent Results (from the past 2160 hour(s))  CBC with Differential     Status: Abnormal   Collection Time: 05/14/20  9:18 AM  Result Value Ref Range   WBC 12.6 (H) 4.0 - 10.5 K/uL   RBC 5.08 3.87 - 5.11 MIL/uL  Hemoglobin 12.2 12.0 - 15.0 g/dL   HCT 38.0 36 - 46 %   MCV 74.8 (L) 80.0 - 100.0 fL   MCH 24.0 (L) 26.0 - 34.0 pg   MCHC 32.1 30.0 - 36.0 g/dL   RDW 15.8 (H) 11.5 - 15.5 %   Platelets 297 150 - 400 K/uL   nRBC 0.0 0.0 - 0.2 %   Neutrophils Relative % 89 %   Neutro Abs 11.3 (H) 1.7 - 7.7 K/uL   Lymphocytes Relative 5 %   Lymphs Abs 0.6 (L) 0.7 - 4.0 K/uL   Monocytes Relative 5 %   Monocytes Absolute 0.7 0.1 - 1.0 K/uL   Eosinophils Relative 0 %   Eosinophils Absolute 0.0 0.0 - 0.5 K/uL   Basophils Relative 0 %   Basophils Absolute 0.0 0.0 - 0.1 K/uL   Immature Granulocytes 1 %   Abs Immature Granulocytes 0.09 (H) 0.00 - 0.07 K/uL    Comment: Performed at [facility], [address] [place], [place] [address]  Comprehensive metabolic panel     Status: Abnormal   Collection Time: 05/14/20  9:18 AM  Result Value Ref Range   Sodium 133 (L) 135 - 145 mmol/L   Potassium 3.5 3.5 - 5.1 mmol/L   Chloride 96 (L) 98 - 111 mmol/L   CO2 25 22 - 32 mmol/L   Glucose, Bld 129 (H) 70 - 99 mg/dL    Comment: Glucose reference range applies only to samples taken after fasting for at least 8 hours.   BUN 13 8 - 23 mg/dL   Creatinine, Ser 0.75 0.44 - 1.00 mg/dL   Calcium 8.7 (L) 8.9 - 10.3 mg/dL   Total Protein 7.4 6.5 - 8.1 g/dL   Albumin 4.0 3.5 - 5.0 g/dL   AST 25 15 - 41 U/L   ALT 42 0 - 44 U/L   Alkaline Phosphatase 90 38 - 126 U/L   Total Bilirubin 0.6 0.3 - 1.2 mg/dL   GFR calc non Af Amer >60 >60 mL/min   GFR  calc Af Amer >60 >60 mL/min   Anion gap 12 5 - 15    Comment: Performed at [facility], [address] [place], [place] [address]  Basic metabolic panel     Status: None   Collection Time: 06/13/20  9:31 AM  Result Value Ref Range   Sodium 137 135 - 145 mmol/L   Potassium 3.8 3.5 - 5.1 mmol/L   Chloride 102 98 - 111 mmol/L   CO2 25 22 - 32 mmol/L   Glucose, Bld 93 70 - 99 mg/dL    Comment: Glucose reference range applies only to samples taken after fasting for at least 8 hours.   BUN 16 8 - 23 mg/dL   Creatinine, Ser 0.88 0.44 - 1.00 mg/dL   Calcium 9.2 8.9 - 10.3 mg/dL   GFR, Estimated >60 >60 mL/min    Comment: (NOTE) Calculated using the CKD-EPI Creatinine Equation (2021)    Anion gap 10 5 - 15    Comment: Performed at [facility], [address] [place], [place] [address]  Lipid panel     Status: Abnormal   Collection Time: 07/03/20  8:57 AM  Result Value Ref Range   Cholesterol, Total 228 (H) 100 - 199 mg/dL   Triglycerides 77 0 - 149 mg/dL   HDL 59 >39 mg/dL   VLDL Cholesterol Cal 13 5 - 40 mg/dL   LDL Chol Calc (NIH) 156 (H) 0 -  99 mg/dL   Chol/HDL Ratio 3.9 0.0 - 4.4 ratio    Comment:                                   T. Chol/HDL Ratio                                             Men  Women                               1/2 Avg.Risk  3.4    3.3                                   Avg.Risk  5.0    4.4                                2X Avg.Risk  9.6    7.1                                3X Avg.Risk 23.4   11.0   T4, free     Status: None   Collection Time: 07/03/20  8:57 AM  Result Value Ref Range   Free T4 1.39 0.82 - 1.77 ng/dL  TSH     Status: None   Collection Time: 07/03/20  8:57 AM  Result Value Ref Range   TSH 3.590 0.450 - 4.500 uIU/mL  Hepatic function panel     Status: None   Collection Time: 07/03/20  8:57 AM  Result Value Ref Range   Total Protein 6.9 6.0 - 8.5 g/dL   Albumin 4.4 3.6 - 4.6 g/dL   Bilirubin Total 0.3 0.0 - 1.2  mg/dL   Bilirubin, Direct 0.10 0.00 - 0.40 mg/dL   Alkaline Phosphatase 103 44 - 121 IU/L    Comment:               **Please note reference interval change**   AST 18 0 - 40 IU/L   ALT 23 0 - 32 IU/L  Vitamin B12     Status: None   Collection Time: 07/03/20  8:57 AM  Result Value Ref Range   Vitamin B-12 975 232 - 1,245 pg/mL    Assessment/Plan:  Carotid stenosis  I have independently reviewed this carotid ultrasound and it suggests a high-grade right carotid artery stenosis.  This had velocities in the 80 to 99% range.  There is some disease on the left although it would fall in the mild range.  The patient remains asymptomatic with respect to the carotid stenosis.  However, the patient has now progressed and has a lesion the is >70%.  Patient should undergo CT angiography of the carotid arteries to define the degree of stenosis of the internal carotid arteries bilaterally and the anatomic suitability for surgery vs. intervention.  If the patient does indeed need surgery cardiac clearance will be required, once cleared the patient will be scheduled for surgery.  The risks, benefits and alternative therapies were reviewed in detail with the patient.  All questions were answered.  The patient agrees to proceed with imaging.  Continue antiplatelet therapy as prescribed. Continue management of CAD, HTN and Hyperlipidemia. Healthy heart diet, encouraged exercise at least 4 times per week.    Hypertension blood pressure control important in reducing the progression of atherosclerotic disease. On appropriate oral medications.   Peripheral neuropathy ([place]) Scheduled to see a neurologist in the near future and she is also going to discuss her dizziness at that time.  Cyst of thyroid Recently evaluated and felt to be benign.  Paroxysmal atrial fibrillation (HCC) Rate controlled and on anticoagulation      Leotis Pain 07/17/2020, 10:17 AM   This note was created with Dragon  medical transcription system.  Any errors from dictation are unintentional.

## 2020-07-17 NOTE — Patient Instructions (Signed)
Carotid Artery Disease  Carotid artery disease is the narrowing or blockage of one or both carotid arteries. This condition is also called carotid artery stenosis. The carotid arteries are the two main blood vessels on either side of the neck. They send blood to the brain, other parts of the head, and the neck.  This condition increases your risk for a stroke or a transient ischemic attack (TIA). A TIA is a "mini-stroke" that causes stroke-like symptoms that go away quickly. What are the causes? This condition is mainly caused by a narrowing and hardening of the carotid arteries. The carotid arteries can become narrow or clogged with a buildup of plaque. Plaque includes:  Fat.  Cholesterol.  Calcium.  Other substances. What increases the risk? The following factors may make you more likely to develop this condition:  Having certain medical conditions, such as: ? High cholesterol. ? High blood pressure. ? Diabetes. ? Obesity.  Smoking.  A family history of cardiovascular disease.  Not being active or lack of regular exercise.  Being female. Men have a higher risk of having arteries become narrow and harden earlier in life than women.  Old age. What are the signs or symptoms? This condition may not have any signs or symptoms until a stroke or TIA happens. In some cases, your doctor may be able to hear a whooshing sound. This can suggest a change in blood flow caused by plaque buildup. An eye exam can also help find signs of the condition. How is this treated? This condition may be treated with more than one treatment. Treatment options include:  Lifestyle changes, such as: ? Quitting smoking. ? Getting regular exercise, or getting exercise as told by your doctor. ? Eating a healthy diet. ? Managing stress. ? Keeping a healthy weight.  Medicines to control: ? Blood pressure. ? Cholesterol. ? Blood clotting.  Surgery. You may have: ? A surgery to remove the blockages in  the carotid arteries. ? A procedure in which a small mesh tube (stent) is used to widen the blocked carotid arteries. Follow these instructions at home: Eating and drinking Follow instructions about your diet from your doctor. It is important to follow a healthy diet.  Eat a diet that includes: ? A lot of fresh fruits and vegetables. ? Low-fat (lean) meats.  Avoid these foods: ? Foods that are high in fat. ? Foods that are high in salt (sodium). ? Foods that are fried. ? Foods that are processed. ? Foods that have few good nutrients (poor nutritional value).  Lifestyle   Keep a healthy weight.  Do exercises as told by your doctor to stay active. Each week, you should get one of the following: ? At least 150 minutes of exercise that raises your heart rate and makes you sweat (moderate-intensity exercise). ? At least 75 minutes of exercise that takes a lot of effort.  Do not use any products that contain nicotine or tobacco, such as cigarettes, e-cigarettes, and chewing tobacco. If you need help quitting, ask your doctor.  Do not drink alcohol if: ? Your doctor tells you not to drink. ? You are pregnant, may be pregnant, or are planning to become pregnant.  If you drink alcohol: ? Limit how much you use to:  0-1 drink a day for women.  0-2 drinks a day for men. ? Be aware of how much alcohol is in your drink. In the [place] one drink equals one 12 oz bottle of beer (355 mL), one 5   oz glass of wine (148 mL), or one 1 oz glass of hard liquor (44 mL).  Do not use drugs.  Manage your stress. Ask your doctor for tips on how to do this. General instructions  Take over-the-counter and prescription medicines only as told by your doctor.  Keep all follow-up visits as told by your doctor. This is important. Where to find more information  American Heart Association: www.heart.org Get help right away if:  You have any signs of a stroke. "BE FAST" is an easy way to remember the  main warning signs: ? B - Balance. Signs are dizziness, sudden trouble walking, or loss of balance. ? E - Eyes. Signs are trouble seeing or a change in how you see. ? F - Face. Signs are sudden weakness or loss of feeling of the face, or the face or eyelid drooping on one side. ? A - Arms. Signs are weakness or loss of feeling in an arm. This happens suddenly and usually on one side of the body. ? S - Speech. Signs are sudden trouble speaking, slurred speech, or trouble understanding what people say. ? T - Time. Time to call emergency services. Write down what time symptoms started.  You have other signs of a stroke, such as: ? A sudden, very bad headache with no known cause. ? Feeling like you may vomit (nausea). ? Vomiting. ? A seizure. These symptoms may be an emergency. Do not wait to see if the symptoms will go away. Get medical help right away. Call your local emergency services (911 in the [place] Do not drive yourself to the hospital. Summary  The carotid arteries are blood vessels on both sides of the neck.  If these arteries get smaller or get blocked, you are more likely to have a stroke or a mini-stroke.  This condition can be treated with lifestyle changes, medicines, surgery, or a blend of these treatments.  Get help right away if you have any signs of a stroke. "BE FAST" is an easy way to remember the main warning signs of stroke. This information is not intended to replace advice given to you by your health care provider. Make sure you discuss any questions you have with your health care provider. Document Revised: 02/28/2019 Document Reviewed: 02/28/2019 Elsevier Patient Education  2020 [employer] Inc.  

## 2020-07-17 NOTE — Assessment & Plan Note (Signed)
Rate controlled and on anticoagulation. 

## 2020-07-17 NOTE — Assessment & Plan Note (Signed)
I have independently reviewed this carotid ultrasound and it suggests a high-grade right carotid artery stenosis.  This had velocities in the 80 to 99% range.  There is some disease on the left although it would fall in the mild range.  The patient remains asymptomatic with respect to the carotid stenosis.  However, the patient has now progressed and has a lesion the is >70%.  Patient should undergo CT angiography of the carotid arteries to define the degree of stenosis of the internal carotid arteries bilaterally and the anatomic suitability for surgery vs. intervention.  If the patient does indeed need surgery cardiac clearance will be required, once cleared the patient will be scheduled for surgery.  The risks, benefits and alternative therapies were reviewed in detail with the patient.  All questions were answered.  The patient agrees to proceed with imaging.  Continue antiplatelet therapy as prescribed. Continue management of CAD, HTN and Hyperlipidemia. Healthy heart diet, encouraged exercise at least 4 times per week.

## 2020-07-17 NOTE — Assessment & Plan Note (Signed)
Scheduled to see a neurologist in the near future and she is also going to discuss her dizziness at that time.

## 2020-07-17 NOTE — Assessment & Plan Note (Signed)
Recently evaluated and felt to be benign.

## 2020-07-23 ENCOUNTER — Ambulatory Visit: Payer: Medicare Other | Admitting: Cardiovascular Disease

## 2020-07-23 ENCOUNTER — Other Ambulatory Visit: Payer: Self-pay | Admitting: *Deleted

## 2020-07-23 DIAGNOSIS — I480 Paroxysmal atrial fibrillation: Secondary | ICD-10-CM

## 2020-07-25 ENCOUNTER — Ambulatory Visit (INDEPENDENT_AMBULATORY_CARE_PROVIDER_SITE_OTHER): Payer: Medicare Other

## 2020-07-25 ENCOUNTER — Other Ambulatory Visit: Payer: Self-pay

## 2020-07-25 DIAGNOSIS — I480 Paroxysmal atrial fibrillation: Secondary | ICD-10-CM

## 2020-07-26 ENCOUNTER — Ambulatory Visit (INDEPENDENT_AMBULATORY_CARE_PROVIDER_SITE_OTHER): Payer: Medicare Other

## 2020-07-26 DIAGNOSIS — Z23 Encounter for immunization: Secondary | ICD-10-CM

## 2020-07-27 LAB — ECHOCARDIOGRAM COMPLETE
AR max vel: 2.75 cm2
AV Area VTI: 2.72 cm2
AV Area mean vel: 2.88 cm2
AV Mean grad: 2 mmHg
AV Peak grad: 5.1 mmHg
Ao pk vel: 1.13 m/s
Calc EF: 54.4 %
S' Lateral: 3.6 cm
Single Plane A2C EF: 48.7 %
Single Plane A4C EF: 58.3 %

## 2020-07-30 NOTE — Progress Notes (Signed)
Cardiology Office Note  Date:  07/31/2020   ID:  Molly Hodges Dec 18, 1931, MRN [id number]  PCP:  Birdie Sons, MD   Chief Complaint  Patient presents with   Follow-up    2 Months follow up and review test results. Medications verbally reviewed with patient. Per patient she has not had a dizzy spell in about 3 weeks but she just had another one the other day.    HPI:  Molly Hodges is a 86 y.o. female with  Paroxysmal Afib,  (CHADS2VASc of 4 - HTN, age x 2, female)  COPD, (never smoked) asthma,  prior episodes of PNA,   hospital admission from 1/30 to 09/21/15,  HTN  Mitral valve stenosis, moderate December 2021 Pericardial effusion mild to moderate December 2021 PAD, carotid stenosis, high-grade on the right who presents Follow-up for her atrial fibrillation,Previous cardioversion, carotid stenosis  Recent issues with dizziness, Cardiac work-up as below Echocardiogram July 25, 2020 reviewed in detail EF 55 to 60%, mildly dilated left atrium, mild to moderate circumferential pericardial effusion, Mild to moderate mitral valve regurgitation moderate mitral valve stenosis gradient 10 mmHg Dilated IVC  Seen by Dr. Lucky Cowboy, episodes of dizziness High-grade right carotid artery stenosis estimated 80 to 99% CT angio neck ordered by Dr. Lucky Cowboy, scheduled this Thursday  "dizziness is back today" Started yesterday Lightheaded, cant keep balance Has neuropathy, scheduled to see neurology in Jan 2022  Total chol 228, LDL 150 Recently started on crestor 10 daily  Active at baseline, likes to cook  EKG personally reviewed by myself on todays visit Shows normal sinus rhythm with rate 70 bpm no significant ST or T-wave changes  Other past medical history reviewed found to be in new onset Afib with RVR on 09/17/15 upon her arrival to [facility], pneumonia started on Eliquis given her stroke risk (CHADS2VASc of 4 - HTN, age x 2, female) and rate controlled.  Beta blockers  were avoided given her asthma. Started on thousand channel blocker    PMH:   has a past medical history of Asthma, Community acquired pneumonia, COPD (chronic obstructive pulmonary disease) ([place]), Essential hypertension, Glaucoma, Mitral regurgitation, and PAF (paroxysmal atrial fibrillation) ([place]) (09/17/2015).  PSH:    Past Surgical History:  Procedure Laterality Date   ABDOMINAL HYSTERECTOMY  1987   due to heavy bleeding   APPENDECTOMY     CATARACT EXTRACTION     ELECTROPHYSIOLOGIC STUDY N/A 11/15/2015   Procedure: CARDIOVERSION;  Surgeon: Molly Merritts, MD;  Location: [facility] ORS;  Service: Cardiovascular;  Laterality: N/A;   TONSILLECTOMY      Current Outpatient Medications  Medication Sig Dispense Refill   albuterol (PROVENTIL) (2.5 MG/3ML) 0.083% nebulizer solution Take 3 mLs (2.5 mg total) by nebulization every 4 (four) hours as needed for up to 12 doses for wheezing or shortness of breath. 36 mL 0   albuterol (VENTOLIN HFA) 108 (90 Base) MCG/ACT inhaler Inhale 2 puffs into the lungs every 6 (six) hours as needed for wheezing or shortness of breath. 8 g 2   amiodarone (PACERONE) 200 MG tablet TAKE ONE TABLET EVERY DAY 90 tablet 0   apixaban (ELIQUIS) 5 MG TABS tablet Take 1 tablet (5 mg total) by mouth 2 (two) times daily. 180 tablet 3   budesonide-formoterol (SYMBICORT) 160-4.5 MCG/ACT inhaler Inhale 2 puffs into the lungs 2 (two) times daily. 1 Inhaler 12   Calcium Carbonate-Vitamin D 600-400 MG-UNIT tablet Take 1 tablet by mouth daily.     cholecalciferol (VITAMIN D)  1000 UNITS tablet Take 1,000 Units by mouth daily.     diltiazem (CARDIZEM CD) 180 MG 24 hr capsule TAKE 1 CAPSULE EVERY DAY 30 capsule 11   dorzolamide (TRUSOPT) 2 % ophthalmic solution Place 1 drop into both eyes 2 (two) times daily.      gabapentin (NEURONTIN) 300 MG capsule TAKE 1 CAPSULE EVERY MORNING AND TAKE 2 CAPSULES AT BEDTIME 90 capsule 4   hydrochlorothiazide (MICROZIDE) 12.5 MG  capsule TAKE 1 CAPSULE BY MOUTH EVERY DAY 90 capsule 1   latanoprost (XALATAN) 0.005 % ophthalmic solution Place 1 drop into both eyes at bedtime.      levalbuterol (XOPENEX HFA) 45 MCG/ACT inhaler Inhale 2 puffs into the lungs every 4 (four) hours as needed for wheezing. 1 Inhaler 2   magnesium oxide (MAG-OX) 400 MG tablet Take 400 mg by mouth daily.     meclizine (ANTIVERT) 12.5 MG tablet Take 1 tablet (12.5 mg total) by mouth 2 (two) times daily as needed for dizziness. 60 tablet 0   Omega-3 Fatty Acids (FISH OIL BURP-LESS) 1000 MG CAPS Take 1,000 mg by mouth daily.      potassium chloride (KLOR-CON) 10 MEQ tablet Take 1 tablet (10 mEq total) by mouth daily. 90 tablet 1   vitamin C (ASCORBIC ACID) 500 MG tablet Take 1,000 mg by mouth daily.     vitamin E 100 UNIT capsule Take 200 Units by mouth daily.     ezetimibe (ZETIA) 10 MG tablet Take 1 tablet (10 mg total) by mouth daily. 90 tablet 3   rosuvastatin (CRESTOR) 20 MG tablet Take 1 tablet (20 mg total) by mouth daily. 90 tablet 3   No current facility-administered medications for this visit.     Allergies:   Shellfish allergy, Albuterol, Azithromycin, and Tamiflu [oseltamivir phosphate]   Social History:  The patient  reports that she has never smoked. She has never used smokeless tobacco. She reports current drug use. Drug: Nitrous oxide. She reports that she does not drink alcohol.   Family History:   family history includes CAD in her father and mother; Cancer (age of onset: 46) in her son.    Review of Systems: Review of Systems  Constitutional: Negative.   Respiratory: Negative.   Cardiovascular: Negative.   Gastrointestinal: Negative.   Musculoskeletal: Negative.   Neurological: Negative.   Psychiatric/Behavioral: Negative.   All other systems reviewed and are negative.   PHYSICAL EXAM: VS:  BP (!) 144/60 (BP Location: Left Arm, Patient Position: Sitting, Cuff Size: Normal)    Pulse 70    Ht 5' (1.524 m)     Wt 135 lb (61.2 kg)    SpO2 98%    BMI 26.37 kg/m  , BMI Body mass index is 26.37 kg/m. Constitutional:  oriented to person, place, and time. No distress.  HENT:  Head: Grossly normal Eyes:  no discharge. No scleral icterus.  Neck: No JVD, 1+ bruit on the right Cardiovascular: Regular rate and rhythm, no murmurs appreciated Pulmonary/Chest: Clear to auscultation bilaterally, no wheezes or rails Abdominal: Soft.  no distension.  no tenderness.  Musculoskeletal: Normal range of motion Neurological:  normal muscle tone. Coordination normal. No atrophy Skin: Skin warm and dry Psychiatric: normal affect, pleasant   Recent Labs: 05/14/2020: Hemoglobin 12.2; Platelets 297 06/13/2020: BUN 16; Creatinine, Ser 0.88; Potassium 3.8; Sodium 137 07/03/2020: ALT 23; TSH 3.590    Lipid Panel Lab Results  Component Value Date   CHOL 228 (H) 07/03/2020   HDL 59 07/03/2020  LDLCALC 156 (H) 07/03/2020   TRIG 77 07/03/2020      Wt Readings from Last 3 Encounters:  07/31/20 135 lb (61.2 kg)  07/17/20 136 lb (61.7 kg)  07/03/20 135 lb (61.2 kg)     ASSESSMENT AND PLAN:  Paroxysmal atrial fibrillation (HCC) - Plan: EKG 12-Lead on Eliquis Maintaining NSR No further medication changes made  PAD/carotid stenosis Bruit on the right on auscultation today Followed by Dr. Lucky Cowboy Having dizzy spells CT this Thursday of her neck to determine if she is a candidate for stent versus carotid endarterectomy  Essential hypertension We will avoid hypotension in the setting of carotid disease and lightheadedness, no medication changes made If blood pressure runs low at home would hold HCTZ   Centrilobular emphysema (HCC) Breathing stable Not using inhalers  Hyperlipidemia Advance Crestor to 20, add Zetia, goal LDL less than 70 preferably 60   Total encounter time more than 25 minutes  Greater than 50% was spent in counseling and coordination of care with the patient     Orders Placed This  Encounter  Procedures   EKG 12-Lead     Signed, Molly Hodges, M.D., Ph.D. 07/31/2020  [facility], [place]

## 2020-07-31 ENCOUNTER — Other Ambulatory Visit: Payer: Self-pay

## 2020-07-31 ENCOUNTER — Encounter: Payer: Self-pay | Admitting: Cardiovascular Disease

## 2020-07-31 ENCOUNTER — Ambulatory Visit: Payer: Medicare Other | Admitting: Cardiovascular Disease

## 2020-07-31 VITALS — BP 144/60 | HR 70 | Ht 60.0 in | Wt 135.0 lb

## 2020-07-31 DIAGNOSIS — I340 Nonrheumatic mitral (valve) insufficiency: Secondary | ICD-10-CM

## 2020-07-31 DIAGNOSIS — I480 Paroxysmal atrial fibrillation: Secondary | ICD-10-CM

## 2020-07-31 DIAGNOSIS — J432 Centrilobular emphysema: Secondary | ICD-10-CM

## 2020-07-31 DIAGNOSIS — I6521 Occlusion and stenosis of right carotid artery: Secondary | ICD-10-CM

## 2020-07-31 DIAGNOSIS — E785 Hyperlipidemia, unspecified: Secondary | ICD-10-CM

## 2020-07-31 DIAGNOSIS — I739 Peripheral vascular disease, unspecified: Secondary | ICD-10-CM | POA: Diagnosis not present

## 2020-07-31 DIAGNOSIS — I10 Essential (primary) hypertension: Secondary | ICD-10-CM

## 2020-07-31 MED ORDER — EZETIMIBE 10 MG PO TABS: 10.0000 mg | ORAL_TABLET | Freq: Every day | ORAL | 3 refills | Status: DC

## 2020-07-31 MED ORDER — ROSUVASTATIN CALCIUM 20 MG PO TABS: 20.0000 mg | ORAL_TABLET | Freq: Every day | ORAL | 3 refills | Status: DC

## 2020-07-31 NOTE — Patient Instructions (Addendum)
Medication Instructions:  Please increase the crestor up to 20 mg daily Start zetia 10 mg daily  If you need a refill on your cardiac medications before your next appointment, please call your pharmacy.    Lab work: No new labs needed   If you have labs (blood work) drawn today and your tests are completely normal, you will receive your results only by: Marland Kitchen MyChart Message (if you have MyChart) OR . A paper copy in the mail If you have any lab test that is abnormal or we need to change your treatment, we will call you to review the results.   Testing/Procedures: No new testing needed   Follow-Up: At [facility], you and your health needs are our priority.  As part of our continuing mission to provide you with exceptional heart care, we have created designated Provider Care Teams.  These Care Teams include your primary Cardiologist (physician) and Advanced Practice Providers (APPs -  Physician Assistants and Nurse Practitioners) who all work together to provide you with the care you need, when you need it.  . You will need a follow up appointment in 6 months  . Providers on your designated Care Team:   . Murray Hodgkins, NP . Christell Faith, PA-C . Marrianne Mood, PA-C  Any Other Special Instructions Will Be Listed Below (If Applicable).  COVID-19 Vaccine Information can be found at: [email or web address].co.uk For questions related to vaccine distribution or appointments, please email [email or web address] or call [phone].

## 2020-08-02 ENCOUNTER — Other Ambulatory Visit: Payer: Self-pay

## 2020-08-02 ENCOUNTER — Ambulatory Visit
Admission: RE | Admit: 2020-08-02 | Discharge: 2020-08-02 | Disposition: A | Discharge status: Home / Self Care | Payer: Medicare Other | Source: Ambulatory Visit | Attending: Vascular Surgery | Admitting: Vascular Surgery

## 2020-08-02 DIAGNOSIS — I6523 Occlusion and stenosis of bilateral carotid arteries: Secondary | ICD-10-CM

## 2020-08-02 DIAGNOSIS — R42 Dizziness and giddiness: Secondary | ICD-10-CM | POA: Diagnosis not present

## 2020-08-02 LAB — POCT I-STAT CREATININE: Creatinine, Ser: 0.9 mg/dL (ref 0.44–1.00)

## 2020-08-02 MED ORDER — IOHEXOL 350 MG/ML SOLN
75.0000 mL | Freq: Once | INTRAVENOUS | Status: AC | PRN
  Administered 2020-08-02: 11:00:00 75 mL via INTRAVENOUS

## 2020-08-06 DIAGNOSIS — H401131 Primary open-angle glaucoma, bilateral, mild stage: Secondary | ICD-10-CM | POA: Diagnosis not present

## 2020-08-07 ENCOUNTER — Ambulatory Visit (INDEPENDENT_AMBULATORY_CARE_PROVIDER_SITE_OTHER): Payer: Medicare Other | Admitting: Vascular Surgery

## 2020-08-07 ENCOUNTER — Encounter (INDEPENDENT_AMBULATORY_CARE_PROVIDER_SITE_OTHER): Payer: Self-pay | Admitting: Vascular Surgery

## 2020-08-07 ENCOUNTER — Other Ambulatory Visit: Payer: Self-pay

## 2020-08-07 ENCOUNTER — Telehealth (INDEPENDENT_AMBULATORY_CARE_PROVIDER_SITE_OTHER): Payer: Self-pay

## 2020-08-07 VITALS — BP 163/68 | HR 68 | Ht 65.0 in | Wt 136.0 lb

## 2020-08-07 DIAGNOSIS — I6523 Occlusion and stenosis of bilateral carotid arteries: Secondary | ICD-10-CM | POA: Diagnosis not present

## 2020-08-07 DIAGNOSIS — I480 Paroxysmal atrial fibrillation: Secondary | ICD-10-CM | POA: Diagnosis not present

## 2020-08-07 DIAGNOSIS — G629 Polyneuropathy, unspecified: Secondary | ICD-10-CM | POA: Diagnosis not present

## 2020-08-07 DIAGNOSIS — E041 Nontoxic single thyroid nodule: Secondary | ICD-10-CM

## 2020-08-07 DIAGNOSIS — I10 Essential (primary) hypertension: Secondary | ICD-10-CM | POA: Diagnosis not present

## 2020-08-07 NOTE — Assessment & Plan Note (Signed)
she has undergone a CT angiogram which I have independently reviewed. The official report of this is a 65% right ICA stenosis and a 60% left ICA stenosis. While I generally agree with the left although I think that is a bit of an overestimation, I think the right carotid stenosis is significantly worse than 65% and more in the high-grade range that would correlate with the duplex she had prior to the CT scan. I had a long discussion today with she and her family regarding options for treatment. She has some symptoms that could certainly be related to her significant carotid disease which is bilateral. In general, if we have disparate findings between CT scan and duplex and angiogram was performed for the most detailed evaluation of the carotid artery. I have presented her with three options today. One would be medical management alone which she does not desire. The second would be to proceed with open right carotid endarterectomy which I think is reasonable given her likely high-grade right carotid artery stenosis and symptoms that are referable to this. The third option would be an angiogram with the intention to perform carotid stenting at that time if greater than 70% disease is identified at the time of angiogram. Her anatomy is reasonable for carotid stenting and given her age and previous history of some cardiac issues, would avoid general anesthesia as well. She is on Eliquis and can continue this. We discussed the risks and benefits of the procedure including stroke, access site related complications, and renal insufficiency. The risk of surgery were also discussed with her today which would include a slightly higher risk of cardiac complications and wound complications and potentially a slightly lower risk of stroke. She would like to proceed with an angiogram with possible carotid stent placement if appropriate disease is encountered at the time of the angiogram. I think this is a reasonable option and  likely the best option in her case.

## 2020-08-07 NOTE — Patient Instructions (Signed)
Carotid Artery Disease  Carotid artery disease is the narrowing or blockage of one or both carotid arteries. This condition is also called carotid artery stenosis. The carotid arteries are the two main blood vessels on either side of the neck. They send blood to the brain, other parts of the head, and the neck.  This condition increases your risk for a stroke or a transient ischemic attack (TIA). A TIA is a "mini-stroke" that causes stroke-like symptoms that go away quickly. What are the causes? This condition is mainly caused by a narrowing and hardening of the carotid arteries. The carotid arteries can become narrow or clogged with a buildup of plaque. Plaque includes:  Fat.  Cholesterol.  Calcium.  Other substances. What increases the risk? The following factors may make you more likely to develop this condition:  Having certain medical conditions, such as: ? High cholesterol. ? High blood pressure. ? Diabetes. ? Obesity.  Smoking.  A family history of cardiovascular disease.  Not being active or lack of regular exercise.  Being female. Men have a higher risk of having arteries become narrow and harden earlier in life than women.  Old age. What are the signs or symptoms? This condition may not have any signs or symptoms until a stroke or TIA happens. In some cases, your doctor may be able to hear a whooshing sound. This can suggest a change in blood flow caused by plaque buildup. An eye exam can also help find signs of the condition. How is this treated? This condition may be treated with more than one treatment. Treatment options include:  Lifestyle changes, such as: ? Quitting smoking. ? Getting regular exercise, or getting exercise as told by your doctor. ? Eating a healthy diet. ? Managing stress. ? Keeping a healthy weight.  Medicines to control: ? Blood pressure. ? Cholesterol. ? Blood clotting.  Surgery. You may have: ? A surgery to remove the blockages in  the carotid arteries. ? A procedure in which a small mesh tube (stent) is used to widen the blocked carotid arteries. Follow these instructions at home: Eating and drinking Follow instructions about your diet from your doctor. It is important to follow a healthy diet.  Eat a diet that includes: ? A lot of fresh fruits and vegetables. ? Low-fat (lean) meats.  Avoid these foods: ? Foods that are high in fat. ? Foods that are high in salt (sodium). ? Foods that are fried. ? Foods that are processed. ? Foods that have few good nutrients (poor nutritional value).  Lifestyle   Keep a healthy weight.  Do exercises as told by your doctor to stay active. Each week, you should get one of the following: ? At least 150 minutes of exercise that raises your heart rate and makes you sweat (moderate-intensity exercise). ? At least 75 minutes of exercise that takes a lot of effort.  Do not use any products that contain nicotine or tobacco, such as cigarettes, e-cigarettes, and chewing tobacco. If you need help quitting, ask your doctor.  Do not drink alcohol if: ? Your doctor tells you not to drink. ? You are pregnant, may be pregnant, or are planning to become pregnant.  If you drink alcohol: ? Limit how much you use to:  0-1 drink a day for women.  0-2 drinks a day for men. ? Be aware of how much alcohol is in your drink. In the [place] one drink equals one 12 oz bottle of beer (355 mL), one 5   oz glass of wine (148 mL), or one 1 oz glass of hard liquor (44 mL).  Do not use drugs.  Manage your stress. Ask your doctor for tips on how to do this. General instructions  Take over-the-counter and prescription medicines only as told by your doctor.  Keep all follow-up visits as told by your doctor. This is important. Where to find more information  American Heart Association: www.heart.org Get help right away if:  You have any signs of a stroke. "BE FAST" is an easy way to remember the  main warning signs: ? B - Balance. Signs are dizziness, sudden trouble walking, or loss of balance. ? E - Eyes. Signs are trouble seeing or a change in how you see. ? F - Face. Signs are sudden weakness or loss of feeling of the face, or the face or eyelid drooping on one side. ? A - Arms. Signs are weakness or loss of feeling in an arm. This happens suddenly and usually on one side of the body. ? S - Speech. Signs are sudden trouble speaking, slurred speech, or trouble understanding what people say. ? T - Time. Time to call emergency services. Write down what time symptoms started.  You have other signs of a stroke, such as: ? A sudden, very bad headache with no known cause. ? Feeling like you may vomit (nausea). ? Vomiting. ? A seizure. These symptoms may be an emergency. Do not wait to see if the symptoms will go away. Get medical help right away. Call your local emergency services (911 in the [place] Do not drive yourself to the hospital. Summary  The carotid arteries are blood vessels on both sides of the neck.  If these arteries get smaller or get blocked, you are more likely to have a stroke or a mini-stroke.  This condition can be treated with lifestyle changes, medicines, surgery, or a blend of these treatments.  Get help right away if you have any signs of a stroke. "BE FAST" is an easy way to remember the main warning signs of stroke. This information is not intended to replace advice given to you by your health care provider. Make sure you discuss any questions you have with your health care provider. Document Revised: 02/28/2019 Document Reviewed: 02/28/2019 Elsevier Patient Education  2020 [employer] Inc.  

## 2020-08-07 NOTE — Telephone Encounter (Signed)
Spoke with the patient and she is scheduled with Dr. Lucky Cowboy for a right carotid stent placement on 08/15/20 with a 6:45 am arrival time to the [facility]. Covid testing on 08/13/20 between 8-1 pm at the [place]. Pre-procedure instructions were discussed and will be mailed.

## 2020-08-07 NOTE — H&P (View-Only) (Signed)
  MRN : [id number]  Molly Hodges is a 86 y.o. (04/01/1932) female who presents with chief complaint of  Chief Complaint  Patient presents with  . Results    CT  .  History of Present Illness: Patient returns today in follow up of her carotid disease. She continues to have severe dizziness and it seems to be getting worse. Since her last visit, she has undergone a CT angiogram which I have independently reviewed. The official report of this is a 65% right ICA stenosis and a 60% left ICA stenosis. While I generally agree with the left although I think that is a bit of an overestimation, I think the right carotid stenosis is significantly worse than 65% and more in the high-grade range that would correlate with the duplex she had prior to the CT scan.  Current Outpatient Medications  Medication Sig Dispense Refill  . albuterol (PROVENTIL) (2.5 MG/3ML) 0.083% nebulizer solution Take 3 mLs (2.5 mg total) by nebulization every 4 (four) hours as needed for up to 12 doses for wheezing or shortness of breath. 36 mL 0  . albuterol (VENTOLIN HFA) 108 (90 Base) MCG/ACT inhaler Inhale 2 puffs into the lungs every 6 (six) hours as needed for wheezing or shortness of breath. 8 g 2  . amiodarone (PACERONE) 200 MG tablet TAKE ONE TABLET EVERY DAY 90 tablet 0  . apixaban (ELIQUIS) 5 MG TABS tablet Take 1 tablet (5 mg total) by mouth 2 (two) times daily. 180 tablet 3  . budesonide-formoterol (SYMBICORT) 160-4.5 MCG/ACT inhaler Inhale 2 puffs into the lungs 2 (two) times daily. 1 Inhaler 12  . Calcium Carbonate-Vitamin D 600-400 MG-UNIT tablet Take 1 tablet by mouth daily.    . cholecalciferol (VITAMIN D) 1000 UNITS tablet Take 1,000 Units by mouth daily.    . diltiazem (CARDIZEM CD) 180 MG 24 hr capsule TAKE 1 CAPSULE EVERY DAY 30 capsule 11  . dorzolamide (TRUSOPT) 2 % ophthalmic solution Place 1 drop into both eyes 2 (two) times daily.     . ezetimibe (ZETIA) 10 MG tablet Take 1 tablet (10 mg total) by  mouth daily. 90 tablet 3  . gabapentin (NEURONTIN) 300 MG capsule TAKE 1 CAPSULE EVERY MORNING AND TAKE 2 CAPSULES AT BEDTIME 90 capsule 4  . hydrochlorothiazide (MICROZIDE) 12.5 MG capsule TAKE 1 CAPSULE BY MOUTH EVERY DAY 90 capsule 1  . latanoprost (XALATAN) 0.005 % ophthalmic solution Place 1 drop into both eyes at bedtime.     . levalbuterol (XOPENEX HFA) 45 MCG/ACT inhaler Inhale 2 puffs into the lungs every 4 (four) hours as needed for wheezing. 1 Inhaler 2  . magnesium oxide (MAG-OX) 400 MG tablet Take 400 mg by mouth daily.    . meclizine (ANTIVERT) 12.5 MG tablet Take 1 tablet (12.5 mg total) by mouth 2 (two) times daily as needed for dizziness. 60 tablet 0  . Omega-3 Fatty Acids (FISH OIL BURP-LESS) 1000 MG CAPS Take 1,000 mg by mouth daily.     . potassium chloride (KLOR-CON) 10 MEQ tablet Take 1 tablet (10 mEq total) by mouth daily. 90 tablet 1  . rosuvastatin (CRESTOR) 20 MG tablet Take 1 tablet (20 mg total) by mouth daily. 90 tablet 3  . vitamin C (ASCORBIC ACID) 500 MG tablet Take 1,000 mg by mouth daily.    . vitamin E 100 UNIT capsule Take 200 Units by mouth daily.     No current facility-administered medications for this visit.    Past Medical History:    Diagnosis Date  . Asthma   . Community acquired pneumonia   . COPD (chronic obstructive pulmonary disease) (HCC)   . Essential hypertension   . Glaucoma   . Mitral regurgitation    a. TTE 08/2015: EF 60-65%, normal wall motion, mild MR, mildly dilated left atrium measuring 40 mm, RVSF normal, PASP normal  . PAF (paroxysmal atrial fibrillation) (HCC) 09/17/2015   a. s/p DCCV 11/15/2015; CHADS2VASc => 4 (HTN, age x 2, female); c. on Eliquis    Past Surgical History:  Procedure Laterality Date  . ABDOMINAL HYSTERECTOMY  1987   due to heavy bleeding  . APPENDECTOMY    . CATARACT EXTRACTION    . ELECTROPHYSIOLOGIC STUDY N/A 11/15/2015   Procedure: CARDIOVERSION;  Surgeon: Timothy J Gollan, MD;  Location: [facility] ORS;   Service: Cardiovascular;  Laterality: N/A;  . TONSILLECTOMY       Social History   Tobacco Use  . Smoking status: Never Smoker  . Smokeless tobacco: Never Used  Vaping Use  . Vaping Use: Never used  Substance Use Topics  . Alcohol use: No  . Drug use: Yes    Types: Nitrous oxide      Family History  Problem Relation Age of Onset  . CAD Mother   . CAD Father   . Cancer Son 63       lung cancer     Allergies  Allergen Reactions  . Shellfish Allergy Anaphylaxis  . Albuterol     Heart racing.   . Azithromycin Other (See Comments)    Extreme burning sensation at IV site  . Tamiflu [Oseltamivir Phosphate] Other (See Comments)    Reaction:  Hallucinations  Reaction:  Hallucinations      REVIEW OF SYSTEMS (Negative unless checked)  Constitutional: []?Weight loss  []?Fever  []?Chills Cardiac: []?Chest pain   []?Chest pressure   []?Palpitations   []?Shortness of breath when laying flat   []?Shortness of breath at rest   []?Shortness of breath with exertion. Vascular:  []?Pain in legs with walking   []?Pain in legs at rest   []?Pain in legs when laying flat   []?Claudication   []?Pain in feet when walking  []?Pain in feet at rest  []?Pain in feet when laying flat   []?History of DVT   []?Phlebitis   []?Swelling in legs   []?Varicose veins   []?Non-healing ulcers Pulmonary:   []?Uses home oxygen   []?Productive cough   []?Hemoptysis   []?Wheeze  []?COPD   []?Asthma Neurologic:  [x]?Dizziness  []?Blackouts   []?Seizures   []?History of stroke   []?History of TIA  []?Aphasia   []?Temporary blindness   []?Dysphagia   []?Weakness or numbness in arms   []?Weakness or numbness in legs Musculoskeletal:  [x]?Arthritis   []?Joint swelling   [x]?Joint pain   []?Low back pain Hematologic:  []?Easy bruising  []?Easy bleeding   []?Hypercoagulable state   []?Anemic  []?Hepatitis Gastrointestinal:  []?Blood in stool   []?Vomiting blood  []?Gastroesophageal reflux/heartburn   []?Abdominal  pain Genitourinary:  []?Chronic kidney disease   []?Difficult urination  []?Frequent urination  []?Burning with urination   []?Hematuria Skin:  []?Rashes   []?Ulcers   []?Wounds Psychological:  []?History of anxiety   []? History of major depression.  Physical Examination  BP (!) 163/68   Pulse 68   Ht 5' 5" (1.651 m)   Wt 136 lb (61.7 kg)   BMI 22.63 kg/m  Gen:  WD/WN, NAD. Appears younger than stated age Head:   [place]/AT, No temporalis wasting. Ear/Nose/Throat: Hearing grossly intact, nares w/o erythema or drainage Eyes: Conjunctiva clear. Sclera non-icteric Neck: Supple.  Trachea midline Pulmonary:  Good air movement, no use of accessory muscles.  Cardiac: irregular Vascular:  Vessel Right Left  Radial Palpable Palpable               Musculoskeletal: M/S 5/5 throughout.  No deformity or atrophy. No edema. Neurologic: Sensation grossly intact in extremities.  Symmetrical.  Speech is fluent.  Psychiatric: Judgment intact, Mood & affect appropriate for pt's clinical situation. Dermatologic: No rashes or ulcers noted.  No cellulitis or open wounds.       Labs Recent Results (from the past 2160 hour(s))  CBC with Differential     Status: Abnormal   Collection Time: 05/14/20  9:18 AM  Result Value Ref Range   WBC 12.6 (H) 4.0 - 10.5 K/uL   RBC 5.08 3.87 - 5.11 MIL/uL   Hemoglobin 12.2 12.0 - 15.0 g/dL   HCT 38.0 36.0 - 46.0 %   MCV 74.8 (L) 80.0 - 100.0 fL   MCH 24.0 (L) 26.0 - 34.0 pg   MCHC 32.1 30.0 - 36.0 g/dL   RDW 15.8 (H) 11.5 - 15.5 %   Platelets 297 150 - 400 K/uL   nRBC 0.0 0.0 - 0.2 %   Neutrophils Relative % 89 %   Neutro Abs 11.3 (H) 1.7 - 7.7 K/uL   Lymphocytes Relative 5 %   Lymphs Abs 0.6 (L) 0.7 - 4.0 K/uL   Monocytes Relative 5 %   Monocytes Absolute 0.7 0.1 - 1.0 K/uL   Eosinophils Relative 0 %   Eosinophils Absolute 0.0 0.0 - 0.5 K/uL   Basophils Relative 0 %   Basophils Absolute 0.0 0.0 - 0.1 K/uL   Immature Granulocytes 1 %   Abs Immature  Granulocytes 0.09 (H) 0.00 - 0.07 K/uL    Comment: Performed at [facility], [address] [place], [place] [address]  Comprehensive metabolic panel     Status: Abnormal   Collection Time: 05/14/20  9:18 AM  Result Value Ref Range   Sodium 133 (L) 135 - 145 mmol/L   Potassium 3.5 3.5 - 5.1 mmol/L   Chloride 96 (L) 98 - 111 mmol/L   CO2 25 22 - 32 mmol/L   Glucose, Bld 129 (H) 70 - 99 mg/dL    Comment: Glucose reference range applies only to samples taken after fasting for at least 8 hours.   BUN 13 8 - 23 mg/dL   Creatinine, Ser 0.75 0.44 - 1.00 mg/dL   Calcium 8.7 (L) 8.9 - 10.3 mg/dL   Total Protein 7.4 6.5 - 8.1 g/dL   Albumin 4.0 3.5 - 5.0 g/dL   AST 25 15 - 41 U/L   ALT 42 0 - 44 U/L   Alkaline Phosphatase 90 38 - 126 U/L   Total Bilirubin 0.6 0.3 - 1.2 mg/dL   GFR calc non Af Amer >60 >60 mL/min   GFR calc Af Amer >60 >60 mL/min   Anion gap 12 5 - 15    Comment: Performed at [facility], [address] , [place] [address]  Basic metabolic panel     Status: None   Collection Time: 06/13/20  9:31 AM  Result Value Ref Range   Sodium 137 135 - 145 mmol/L   Potassium 3.8 3.5 - 5.1 mmol/L   Chloride 102 98 - 111 mmol/L   CO2 25 22 - 32 mmol/L     Glucose, Bld 93 70 - 99 mg/dL    Comment: Glucose reference range applies only to samples taken after fasting for at least 8 hours.   BUN 16 8 - 23 mg/dL   Creatinine, Ser 0.88 0.44 - 1.00 mg/dL   Calcium 9.2 8.9 - 10.3 mg/dL   GFR, Estimated >60 >60 mL/min    Comment: (NOTE) Calculated using the CKD-EPI Creatinine Equation (2021)    Anion gap 10 5 - 15    Comment: Performed at [facility], [address] [place], [place] [address]  Lipid panel     Status: Abnormal   Collection Time: 07/03/20  8:57 AM  Result Value Ref Range   Cholesterol, Total 228 (H) 100 - 199 mg/dL   Triglycerides 77 0 - 149 mg/dL   HDL 59 >39 mg/dL   VLDL Cholesterol Cal 13 5 - 40 mg/dL   LDL Chol Calc (NIH) 156  (H) 0 - 99 mg/dL   Chol/HDL Ratio 3.9 0.0 - 4.4 ratio    Comment:                                   T. Chol/HDL Ratio                                             Men  Women                               1/2 Avg.Risk  3.4    3.3                                   Avg.Risk  5.0    4.4                                2X Avg.Risk  9.6    7.1                                3X Avg.Risk 23.4   11.0   T4, free     Status: None   Collection Time: 07/03/20  8:57 AM  Result Value Ref Range   Free T4 1.39 0.82 - 1.77 ng/dL  TSH     Status: None   Collection Time: 07/03/20  8:57 AM  Result Value Ref Range   TSH 3.590 0.450 - 4.500 uIU/mL  Hepatic function panel     Status: None   Collection Time: 07/03/20  8:57 AM  Result Value Ref Range   Total Protein 6.9 6.0 - 8.5 g/dL   Albumin 4.4 3.6 - 4.6 g/dL   Bilirubin Total 0.3 0.0 - 1.2 mg/dL   Bilirubin, Direct 0.10 0.00 - 0.40 mg/dL   Alkaline Phosphatase 103 44 - 121 IU/L    Comment:               **Please note reference interval change**   AST 18 0 - 40 IU/L   ALT 23 0 - 32 IU/L  Vitamin B12     Status: None   Collection Time: 07/03/20  8:57 AM    Result Value Ref Range   Vitamin B-12 975 232 - 1,245 pg/mL  ECHOCARDIOGRAM COMPLETE     Status: None   Collection Time: 07/25/20  4:27 PM  Result Value Ref Range   AR max vel 2.75 cm2   AV Peak grad 5.1 mmHg   Ao pk vel 1.13 m/s   S' Lateral 3.60 cm   AV Area VTI 2.72 cm2   AV Mean grad 2.0 mmHg   Single Plane A4C EF 58.3 %   Single Plane A2C EF 48.7 %   Calc EF 54.4 %   AV Area mean vel 2.88 cm2  I-STAT creatinine     Status: None   Collection Time: 08/02/20 10:51 AM  Result Value Ref Range   Creatinine, Ser 0.90 0.44 - 1.00 mg/dL    Radiology CT Angio Neck W/Cm &/Or Wo/Cm  Result Date: 08/02/2020 CLINICAL DATA:  Intermittent dizziness, carotid stenosis EXAM: CT ANGIOGRAPHY NECK TECHNIQUE: Multidetector CT imaging of the neck was performed using the standard protocol during bolus  administration of intravenous contrast. Multiplanar CT image reconstructions and MIPs were obtained to evaluate the vascular anatomy. Carotid stenosis measurements (when applicable) are obtained utilizing NASCET criteria, using the distal internal carotid diameter as the denominator. CONTRAST:  75mL OMNIPAQUE IOHEXOL 350 MG/ML SOLN COMPARISON:  Chest CT 2017 FINDINGS: Aortic arch: Calcified plaque along the aortic arch and at the patent great vessel origins. Right carotid system: Common carotid is patent with multifocal calcified and noncalcified plaque causing less than 50% stenosis. Cervical internal carotid artery is patent. There is mixed but primarily calcified plaque along the proximal internal carotid. This causes up to 65% stenosis. External carotid artery is patent Left carotid system: Common carotid artery is patent with multifocal calcified and noncalcified plaque causing less than 50% stenosis. Cervical internal carotid is patent. Mixed but primarily calcified plaque along the proximal internal carotid. This causes up to 60% stenosis. External carotid is patent. Vertebral arteries: Dominant extracranial left vertebral artery is patent. Congenitally diminutive extracranial right vertebral artery is patent. Skeleton: Degenerative changes of the included spine. Canal stenosis is greatest at C5-C6. Other neck: Multinodular thyroid.  Recent evaluated by ultrasound. Upper chest: Nonenlarged and mildly enlarged mediastinal lymph nodes as seen on 20/7 chest CT. This appears mildly progressed. No apical lung mass. IMPRESSION: Mixed plaque along the right greater than left common carotid arteries causing less than 50% stenosis. Primarily calcified plaque along the proximal right internal carotid causing up to 65% stenosis. Primarily calcified plaque along the proximal left internal carotid causing up to 60% stenosis. Patent vertebral arteries without measurable stenosis. Electronically Signed   By: Praneil  Patel  M.D.   On: 08/02/2020 11:30   ECHOCARDIOGRAM COMPLETE  Result Date: 07/27/2020    ECHOCARDIOGRAM REPORT   Patient Name:   Molly Hodges Date of Exam: 07/25/2020 Medical Rec #:  [id number]          Height:       60.0 in Accession #:    [id number]         Weight:       136.0 lb Date of Birth:  09/05/1931           BSA:          1.584 m Patient Age:    86 years           BP:           136/86 mmHg Patient Gender: F                    HR:           75 bpm. Exam Location:  [facility] Procedure: 2D Echo, Cardiac Doppler and Color Doppler Indications:    I48.0 Paroxysmal atrial fibrillation; R60.0 Lower extremity                 edema  History:        Patient has prior history of Echocardiogram examinations, most                 recent 09/17/2015. COPD and Carotid Disease, Arrythmias:Atrial                 Fibrillation, Signs/Symptoms:Edema; Risk Factors:Hypertension                 and Non-Smoker.  Sonographer:    Gary Joseph RDMS, RVT, RDCS Referring Phys: [id number] RYAN M DUNN IMPRESSIONS  1. Left ventricular ejection fraction, by estimation, is 55 to 60%. The left ventricle has normal function. The left ventricle has no regional wall motion abnormalities. Left ventricular diastolic parameters are indeterminate.  2. Right ventricular systolic function is normal. The right ventricular size is normal. Tricuspid regurgitation signal is inadequate for assessing PA pressure.  3. Left atrial size was mildly dilated.  4. Mild to Moderate circumferential pericardial effusion.  5. The mitral valve leaflets are thickened. Mild to moderate mitral valve regurgitation. Moderate mitral stenosis. The mean mitral valve gradient is 10.0 mmHg.  6. The inferior vena cava is dilated in size with <50% respiratory variability, suggesting right atrial pressure of 15 mmHg. FINDINGS  Left Ventricle: Left ventricular ejection fraction, by estimation, is 55 to 60%. The left ventricle has normal function. The left ventricle has no regional wall  motion abnormalities. The left ventricular internal cavity size was normal in size. There is  no left ventricular hypertrophy. Left ventricular diastolic parameters are indeterminate. Right Ventricle: The right ventricular size is normal. No increase in right ventricular wall thickness. Right ventricular systolic function is normal. Tricuspid regurgitation signal is inadequate for assessing PA pressure. Left Atrium: Left atrial size was mildly dilated. Right Atrium: Right atrial size was normal in size. Pericardium: A moderately sized pericardial effusion is present. Mitral Valve: The mitral valve is normal in structure. There is moderate thickening of the mitral valve leaflet(s). Mild to moderate mitral valve regurgitation. Moderate mitral valve stenosis. MV peak gradient, 31.5 mmHg. The mean mitral valve gradient is 10.0 mmHg. Tricuspid Valve: The tricuspid valve is normal in structure. Tricuspid valve regurgitation is not demonstrated. No evidence of tricuspid stenosis. Aortic Valve: The aortic valve is normal in structure. Aortic valve regurgitation is not visualized. No aortic stenosis is present. Aortic valve mean gradient measures 2.0 mmHg. Aortic valve peak gradient measures 5.1 mmHg. Aortic valve area, by VTI measures 2.72 cm. Pulmonic Valve: The pulmonic valve was normal in structure. Pulmonic valve regurgitation is mild. No evidence of pulmonic stenosis. Aorta: The aortic root is normal in size and structure. Venous: The inferior vena cava is dilated in size with less than 50% respiratory variability, suggesting right atrial pressure of 15 mmHg. IAS/Shunts: No atrial level shunt detected by color flow Doppler.  LEFT VENTRICLE PLAX 2D LVIDd:         5.30 cm LVIDs:         3.60 cm LV PW:         0.70 cm LV IVS:        0.90 cm LVOT diam:     2.00 cm LV SV:           68 LV SV Index:   43 LVOT Area:     3.14 cm  LV Volumes (MOD) LV vol d, MOD A2C: 60.5 ml LV vol d, MOD A4C: 80.9 ml LV vol s, MOD A2C: 31.1 ml  LV vol s, MOD A4C: 33.7 ml LV SV MOD A2C:     29.5 ml LV SV MOD A4C:     80.9 ml LV SV MOD BP:      40.5 ml RIGHT VENTRICLE             IVC RV S prime:     10.60 cm/s  IVC diam: 2.40 cm TAPSE (M-mode): 2.2 cm LEFT ATRIUM             Index LA diam:        4.45 cm 2.81 cm/m LA Vol (A2C):   59.3 ml 37.43 ml/m LA Vol (A4C):   55.0 ml 34.71 ml/m LA Biplane Vol: 60.9 ml 38.44 ml/m  AORTIC VALVE                   PULMONIC VALVE AV Area (Vmax):    2.75 cm    PV Vmax:       0.85 m/s AV Area (Vmean):   2.88 cm    PV Peak grad:  2.9 mmHg AV Area (VTI):     2.72 cm AV Vmax:           113.00 cm/s AV Vmean:          73.800 cm/s AV VTI:            0.252 m AV Peak Grad:      5.1 mmHg AV Mean Grad:      2.0 mmHg LVOT Vmax:         99.00 cm/s LVOT Vmean:        67.700 cm/s LVOT VTI:          0.218 m LVOT/AV VTI ratio: 0.87  AORTA Ao Arch diam: 2.1 cm MITRAL VALVE MV Peak grad: 31.5 mmHg  SHUNTS MV Mean grad: 10.0 mmHg  Systemic VTI:  0.22 m MV Vmax:      2.81 m/s   Systemic Diam: 2.00 cm MV Vmean:     145.0 cm/s Timothy Gollan MD Electronically signed by Timothy Gollan MD Signature Date/Time: 07/27/2020/7:56:29 PM    Final     Assessment/Plan Hypertension blood pressure control important in reducing the progression of atherosclerotic disease. On appropriate oral medications.   Peripheral neuropathy (HCC) Scheduled to see a neurologist in the near future and she is also going to discuss her dizziness at that time.  Cyst of thyroid Recently evaluated and felt to be benign.  Paroxysmal atrial fibrillation (HCC) Rate controlled and on anticoagulation  Carotid stenosis she has undergone a CT angiogram which I have independently reviewed. The official report of this is a 65% right ICA stenosis and a 60% left ICA stenosis. While I generally agree with the left although I think that is a bit of an overestimation, I think the right carotid stenosis is significantly worse than 65% and more in the high-grade range  that would correlate with the duplex she had prior to the CT scan. I had a long discussion today with she and her family regarding options for treatment. She has some symptoms that could certainly be related to her significant carotid disease which is bilateral. In general, if we have disparate findings between CT scan and duplex and angiogram was performed for the   most detailed evaluation of the carotid artery. I have presented her with three options today. One would be medical management alone which she does not desire. The second would be to proceed with open right carotid endarterectomy which I think is reasonable given her likely high-grade right carotid artery stenosis and symptoms that are referable to this. The third option would be an angiogram with the intention to perform carotid stenting at that time if greater than 70% disease is identified at the time of angiogram. Her anatomy is reasonable for carotid stenting and given her age and previous history of some cardiac issues, would avoid general anesthesia as well. She is on Eliquis and can continue this. We discussed the risks and benefits of the procedure including stroke, access site related complications, and renal insufficiency. The risk of surgery were also discussed with her today which would include a slightly higher risk of cardiac complications and wound complications and potentially a slightly lower risk of stroke. She would like to proceed with an angiogram with possible carotid stent placement if appropriate disease is encountered at the time of the angiogram. I think this is a reasonable option and likely the best option in her case.    Molly Simien, MD  08/07/2020 12:46 PM    This note was created with Dragon medical transcription system.  Any errors from dictation are purely unintentional 

## 2020-08-07 NOTE — Progress Notes (Signed)
MRN : [id number]  Molly Hodges is a 86 y.o. (1932/02/08) female who presents with chief complaint of  Chief Complaint  Patient presents with  . Results    CT  .  History of Present Illness: Patient returns today in follow up of her carotid disease. She continues to have severe dizziness and it seems to be getting worse. Since her last visit, she has undergone a CT angiogram which I have independently reviewed. The official report of this is a 65% right ICA stenosis and a 60% left ICA stenosis. While I generally agree with the left although I think that is a bit of an overestimation, I think the right carotid stenosis is significantly worse than 65% and more in the high-grade range that would correlate with the duplex she had prior to the CT scan.  Current Outpatient Medications  Medication Sig Dispense Refill  . albuterol (PROVENTIL) (2.5 MG/3ML) 0.083% nebulizer solution Take 3 mLs (2.5 mg total) by nebulization every 4 (four) hours as needed for up to 12 doses for wheezing or shortness of breath. 36 mL 0  . albuterol (VENTOLIN HFA) 108 (90 Base) MCG/ACT inhaler Inhale 2 puffs into the lungs every 6 (six) hours as needed for wheezing or shortness of breath. 8 g 2  . amiodarone (PACERONE) 200 MG tablet TAKE ONE TABLET EVERY DAY 90 tablet 0  . apixaban (ELIQUIS) 5 MG TABS tablet Take 1 tablet (5 mg total) by mouth 2 (two) times daily. 180 tablet 3  . budesonide-formoterol (SYMBICORT) 160-4.5 MCG/ACT inhaler Inhale 2 puffs into the lungs 2 (two) times daily. 1 Inhaler 12  . Calcium Carbonate-Vitamin D 600-400 MG-UNIT tablet Take 1 tablet by mouth daily.    . cholecalciferol (VITAMIN D) 1000 UNITS tablet Take 1,000 Units by mouth daily.    Marland Kitchen diltiazem (CARDIZEM CD) 180 MG 24 hr capsule TAKE 1 CAPSULE EVERY DAY 30 capsule 11  . dorzolamide (TRUSOPT) 2 % ophthalmic solution Place 1 drop into both eyes 2 (two) times daily.     Marland Kitchen ezetimibe (ZETIA) 10 MG tablet Take 1 tablet (10 mg total) by  mouth daily. 90 tablet 3  . gabapentin (NEURONTIN) 300 MG capsule TAKE 1 CAPSULE EVERY MORNING AND TAKE 2 CAPSULES AT BEDTIME 90 capsule 4  . hydrochlorothiazide (MICROZIDE) 12.5 MG capsule TAKE 1 CAPSULE BY MOUTH EVERY DAY 90 capsule 1  . latanoprost (XALATAN) 0.005 % ophthalmic solution Place 1 drop into both eyes at bedtime.     . levalbuterol (XOPENEX HFA) 45 MCG/ACT inhaler Inhale 2 puffs into the lungs every 4 (four) hours as needed for wheezing. 1 Inhaler 2  . magnesium oxide (MAG-OX) 400 MG tablet Take 400 mg by mouth daily.    . meclizine (ANTIVERT) 12.5 MG tablet Take 1 tablet (12.5 mg total) by mouth 2 (two) times daily as needed for dizziness. 60 tablet 0  . Omega-3 Fatty Acids (FISH OIL BURP-LESS) 1000 MG CAPS Take 1,000 mg by mouth daily.     . potassium chloride (KLOR-CON) 10 MEQ tablet Take 1 tablet (10 mEq total) by mouth daily. 90 tablet 1  . rosuvastatin (CRESTOR) 20 MG tablet Take 1 tablet (20 mg total) by mouth daily. 90 tablet 3  . vitamin C (ASCORBIC ACID) 500 MG tablet Take 1,000 mg by mouth daily.    . vitamin E 100 UNIT capsule Take 200 Units by mouth daily.     No current facility-administered medications for this visit.    Past Medical History:  Diagnosis Date  . Asthma   . Community acquired pneumonia   . COPD (chronic obstructive pulmonary disease) ([place])   . Essential hypertension   . Glaucoma   . Mitral regurgitation    a. TTE 08/2015: EF 60-65%, normal wall motion, mild MR, mildly dilated left atrium measuring 40 mm, RVSF normal, PASP normal  . PAF (paroxysmal atrial fibrillation) ([place]) 09/17/2015   a. s/p DCCV 11/15/2015; CHADS2VASc => 4 (HTN, age x 2, female); c. on Eliquis    Past Surgical History:  Procedure Laterality Date  . ABDOMINAL HYSTERECTOMY  1987   due to heavy bleeding  . APPENDECTOMY    . CATARACT EXTRACTION    . ELECTROPHYSIOLOGIC STUDY N/A 11/15/2015   Procedure: CARDIOVERSION;  Surgeon: Minna Merritts, MD;  Location: [facility] ORS;   Service: Cardiovascular;  Laterality: N/A;  . TONSILLECTOMY       Social History   Tobacco Use  . Smoking status: Never Smoker  . Smokeless tobacco: Never Used  Vaping Use  . Vaping Use: Never used  Substance Use Topics  . Alcohol use: No  . Drug use: Yes    Types: Nitrous oxide      Family History  Problem Relation Age of Onset  . CAD Mother   . CAD Father   . Cancer Son 63       lung cancer     Allergies  Allergen Reactions  . Shellfish Allergy Anaphylaxis  . Albuterol     Heart racing.   . Azithromycin Other (See Comments)    Extreme burning sensation at IV site  . Tamiflu [Oseltamivir Phosphate] Other (See Comments)    Reaction:  Hallucinations  Reaction:  Hallucinations      REVIEW OF SYSTEMS (Negative unless checked)  Constitutional: [] ?Weight loss  [] ?Fever  [] ?Chills Cardiac: [] ?Chest pain   [] ?Chest pressure   [] ?Palpitations   [] ?Shortness of breath when laying flat   [] ?Shortness of breath at rest   [] ?Shortness of breath with exertion. Vascular:  [] ?Pain in legs with walking   [] ?Pain in legs at rest   [] ?Pain in legs when laying flat   [] ?Claudication   [] ?Pain in feet when walking  [] ?Pain in feet at rest  [] ?Pain in feet when laying flat   [] ?History of DVT   [] ?Phlebitis   [] ?Swelling in legs   [] ?Varicose veins   [] ?Non-healing ulcers Pulmonary:   [] ?Uses home oxygen   [] ?Productive cough   [] ?Hemoptysis   [] ?Wheeze  [] ?COPD   [] ?Asthma Neurologic:  [x] ?Dizziness  [] ?Blackouts   [] ?Seizures   [] ?History of stroke   [] ?History of TIA  [] ?Aphasia   [] ?Temporary blindness   [] ?Dysphagia   [] ?Weakness or numbness in arms   [] ?Weakness or numbness in legs Musculoskeletal:  [x] ?Arthritis   [] ?Joint swelling   [x] ?Joint pain   [] ?Low back pain Hematologic:  [] ?Easy bruising  [] ?Easy bleeding   [] ?Hypercoagulable state   [] ?Anemic  [] ?Hepatitis Gastrointestinal:  [] ?Blood in stool   [] ?Vomiting blood  [] ?Gastroesophageal reflux/heartburn   [] ?Abdominal  pain Genitourinary:  [] ?Chronic kidney disease   [] ?Difficult urination  [] ?Frequent urination  [] ?Burning with urination   [] ?Hematuria Skin:  [] ?Rashes   [] ?Ulcers   [] ?Wounds Psychological:  [] ?History of anxiety   [] ? History of major depression.  Physical Examination  BP (!) 163/68   Pulse 68   Ht 5\' 5"  (1.651 m)   Wt 136 lb (61.7 kg)   BMI 22.63 kg/m  Gen:  WD/WN, NAD. Appears younger than stated age Head:  [place]/AT, No temporalis wasting. Ear/Nose/Throat: Hearing grossly intact, nares w/o erythema or drainage Eyes: Conjunctiva clear. Sclera non-icteric Neck: Supple.  Trachea midline Pulmonary:  Good air movement, no use of accessory muscles.  Cardiac: irregular Vascular:  Vessel Right Left  Radial Palpable Palpable               Musculoskeletal: M/S 5/5 throughout.  No deformity or atrophy. No edema. Neurologic: Sensation grossly intact in extremities.  Symmetrical.  Speech is fluent.  Psychiatric: Judgment intact, Mood & affect appropriate for pt's clinical situation. Dermatologic: No rashes or ulcers noted.  No cellulitis or open wounds.       Labs Recent Results (from the past 2160 hour(s))  CBC with Differential     Status: Abnormal   Collection Time: 05/14/20  9:18 AM  Result Value Ref Range   WBC 12.6 (H) 4.0 - 10.5 K/uL   RBC 5.08 3.87 - 5.11 MIL/uL   Hemoglobin 12.2 12.0 - 15.0 g/dL   HCT 38.0 36.0 - 46.0 %   MCV 74.8 (L) 80.0 - 100.0 fL   MCH 24.0 (L) 26.0 - 34.0 pg   MCHC 32.1 30.0 - 36.0 g/dL   RDW 15.8 (H) 11.5 - 15.5 %   Platelets 297 150 - 400 K/uL   nRBC 0.0 0.0 - 0.2 %   Neutrophils Relative % 89 %   Neutro Abs 11.3 (H) 1.7 - 7.7 K/uL   Lymphocytes Relative 5 %   Lymphs Abs 0.6 (L) 0.7 - 4.0 K/uL   Monocytes Relative 5 %   Monocytes Absolute 0.7 0.1 - 1.0 K/uL   Eosinophils Relative 0 %   Eosinophils Absolute 0.0 0.0 - 0.5 K/uL   Basophils Relative 0 %   Basophils Absolute 0.0 0.0 - 0.1 K/uL   Immature Granulocytes 1 %   Abs Immature  Granulocytes 0.09 (H) 0.00 - 0.07 K/uL    Comment: Performed at [facility], [address] [place], [place] [address]  Comprehensive metabolic panel     Status: Abnormal   Collection Time: 05/14/20  9:18 AM  Result Value Ref Range   Sodium 133 (L) 135 - 145 mmol/L   Potassium 3.5 3.5 - 5.1 mmol/L   Chloride 96 (L) 98 - 111 mmol/L   CO2 25 22 - 32 mmol/L   Glucose, Bld 129 (H) 70 - 99 mg/dL    Comment: Glucose reference range applies only to samples taken after fasting for at least 8 hours.   BUN 13 8 - 23 mg/dL   Creatinine, Ser 0.75 0.44 - 1.00 mg/dL   Calcium 8.7 (L) 8.9 - 10.3 mg/dL   Total Protein 7.4 6.5 - 8.1 g/dL   Albumin 4.0 3.5 - 5.0 g/dL   AST 25 15 - 41 U/L   ALT 42 0 - 44 U/L   Alkaline Phosphatase 90 38 - 126 U/L   Total Bilirubin 0.6 0.3 - 1.2 mg/dL   GFR calc non Af Amer >60 >60 mL/min   GFR calc Af Amer >60 >60 mL/min   Anion gap 12 5 - 15    Comment: Performed at [facility], [address], [place], [place] [address]  Basic metabolic panel     Status: None   Collection Time: 06/13/20  9:31 AM  Result Value Ref Range   Sodium 137 135 - 145 mmol/L   Potassium 3.8 3.5 - 5.1 mmol/L   Chloride 102 98 - 111 mmol/L   CO2 25 22 - 32 mmol/L  Glucose, Bld 93 70 - 99 mg/dL    Comment: Glucose reference range applies only to samples taken after fasting for at least 8 hours.   BUN 16 8 - 23 mg/dL   Creatinine, Ser 0.88 0.44 - 1.00 mg/dL   Calcium 9.2 8.9 - 10.3 mg/dL   GFR, Estimated >60 >60 mL/min    Comment: (NOTE) Calculated using the CKD-EPI Creatinine Equation (2021)    Anion gap 10 5 - 15    Comment: Performed at [facility], [address] [place], [place] [address]  Lipid panel     Status: Abnormal   Collection Time: 07/03/20  8:57 AM  Result Value Ref Range   Cholesterol, Total 228 (H) 100 - 199 mg/dL   Triglycerides 77 0 - 149 mg/dL   HDL 59 >39 mg/dL   VLDL Cholesterol Cal 13 5 - 40 mg/dL   LDL Chol Calc (NIH) 156  (H) 0 - 99 mg/dL   Chol/HDL Ratio 3.9 0.0 - 4.4 ratio    Comment:                                   T. Chol/HDL Ratio                                             Men  Women                               1/2 Avg.Risk  3.4    3.3                                   Avg.Risk  5.0    4.4                                2X Avg.Risk  9.6    7.1                                3X Avg.Risk 23.4   11.0   T4, free     Status: None   Collection Time: 07/03/20  8:57 AM  Result Value Ref Range   Free T4 1.39 0.82 - 1.77 ng/dL  TSH     Status: None   Collection Time: 07/03/20  8:57 AM  Result Value Ref Range   TSH 3.590 0.450 - 4.500 uIU/mL  Hepatic function panel     Status: None   Collection Time: 07/03/20  8:57 AM  Result Value Ref Range   Total Protein 6.9 6.0 - 8.5 g/dL   Albumin 4.4 3.6 - 4.6 g/dL   Bilirubin Total 0.3 0.0 - 1.2 mg/dL   Bilirubin, Direct 0.10 0.00 - 0.40 mg/dL   Alkaline Phosphatase 103 44 - 121 IU/L    Comment:               **Please note reference interval change**   AST 18 0 - 40 IU/L   ALT 23 0 - 32 IU/L  Vitamin B12     Status: None   Collection Time: 07/03/20  8:57 AM  Result Value Ref Range   Vitamin B-12 975 232 - 1,245 pg/mL  ECHOCARDIOGRAM COMPLETE     Status: None   Collection Time: 07/25/20  4:27 PM  Result Value Ref Range   AR max vel 2.75 cm2   AV Peak grad 5.1 mmHg   Ao pk vel 1.13 m/s   S' Lateral 3.60 cm   AV Area VTI 2.72 cm2   AV Mean grad 2.0 mmHg   Single Plane A4C EF 58.3 %   Single Plane A2C EF 48.7 %   Calc EF 54.4 %   AV Area mean vel 2.88 cm2  I-STAT creatinine     Status: None   Collection Time: 08/02/20 10:51 AM  Result Value Ref Range   Creatinine, Ser 0.90 0.44 - 1.00 mg/dL    Radiology CT Angio Neck W/Cm &/Or Wo/Cm  Result Date: 08/02/2020 CLINICAL DATA:  Intermittent dizziness, carotid stenosis EXAM: CT ANGIOGRAPHY NECK TECHNIQUE: Multidetector CT imaging of the neck was performed using the standard protocol during bolus  administration of intravenous contrast. Multiplanar CT image reconstructions and MIPs were obtained to evaluate the vascular anatomy. Carotid stenosis measurements (when applicable) are obtained utilizing NASCET criteria, using the distal internal carotid diameter as the denominator. CONTRAST:  34mL OMNIPAQUE IOHEXOL 350 MG/ML SOLN COMPARISON:  Chest CT 2017 FINDINGS: Aortic arch: Calcified plaque along the aortic arch and at the patent great vessel origins. Right carotid system: Common carotid is patent with multifocal calcified and noncalcified plaque causing less than 50% stenosis. Cervical internal carotid artery is patent. There is mixed but primarily calcified plaque along the proximal internal carotid. This causes up to 65% stenosis. External carotid artery is patent Left carotid system: Common carotid artery is patent with multifocal calcified and noncalcified plaque causing less than 50% stenosis. Cervical internal carotid is patent. Mixed but primarily calcified plaque along the proximal internal carotid. This causes up to 60% stenosis. External carotid is patent. Vertebral arteries: Dominant extracranial left vertebral artery is patent. Congenitally diminutive extracranial right vertebral artery is patent. Skeleton: Degenerative changes of the included spine. Canal stenosis is greatest at C5-C6. Other neck: Multinodular thyroid.  Recent evaluated by ultrasound. Upper chest: Nonenlarged and mildly enlarged mediastinal lymph nodes as seen on 20/7 chest CT. This appears mildly progressed. No apical lung mass. IMPRESSION: Mixed plaque along the right greater than left common carotid arteries causing less than 50% stenosis. Primarily calcified plaque along the proximal right internal carotid causing up to 65% stenosis. Primarily calcified plaque along the proximal left internal carotid causing up to 60% stenosis. Patent vertebral arteries without measurable stenosis. Electronically Signed   By: Macy Mis  M.D.   On: 08/02/2020 11:30   ECHOCARDIOGRAM COMPLETE  Result Date: 07/27/2020    ECHOCARDIOGRAM REPORT   Patient Name:   Molly Hodges Date of Exam: 07/25/2020 Medical Rec #:  [id number]          Height:       60.0 in Accession #:    [id number]         Weight:       136.0 lb Date of Birth:  [birth year]           BSA:          1.584 m Patient Age:    70 years           BP:           136/86 mmHg Patient Gender: F  HR:           75 bpm. Exam Location:  [facility] Procedure: 2D Echo, Cardiac Doppler and Color Doppler Indications:    I48.0 Paroxysmal atrial fibrillation; R60.0 Lower extremity                 edema  History:        Patient has prior history of Echocardiogram examinations, most                 recent 09/17/2015. COPD and Carotid Disease, Arrythmias:Atrial                 Fibrillation, Signs/Symptoms:Edema; Risk Factors:Hypertension                 and Non-Smoker.  Sonographer:    Pilar Jarvis RDMS, RVT, RDCS Referring Phys: [id number] [place]  1. Left ventricular ejection fraction, by estimation, is 55 to 60%. The left ventricle has normal function. The left ventricle has no regional wall motion abnormalities. Left ventricular diastolic parameters are indeterminate.  2. Right ventricular systolic function is normal. The right ventricular size is normal. Tricuspid regurgitation signal is inadequate for assessing PA pressure.  3. Left atrial size was mildly dilated.  4. Mild to Moderate circumferential pericardial effusion.  5. The mitral valve leaflets are thickened. Mild to moderate mitral valve regurgitation. Moderate mitral stenosis. The mean mitral valve gradient is 10.0 mmHg.  6. The inferior vena cava is dilated in size with <50% respiratory variability, suggesting right atrial pressure of 15 mmHg. FINDINGS  Left Ventricle: Left ventricular ejection fraction, by estimation, is 55 to 60%. The left ventricle has normal function. The left ventricle has no regional wall  motion abnormalities. The left ventricular internal cavity size was normal in size. There is  no left ventricular hypertrophy. Left ventricular diastolic parameters are indeterminate. Right Ventricle: The right ventricular size is normal. No increase in right ventricular wall thickness. Right ventricular systolic function is normal. Tricuspid regurgitation signal is inadequate for assessing PA pressure. Left Atrium: Left atrial size was mildly dilated. Right Atrium: Right atrial size was normal in size. Pericardium: A moderately sized pericardial effusion is present. Mitral Valve: The mitral valve is normal in structure. There is moderate thickening of the mitral valve leaflet(s). Mild to moderate mitral valve regurgitation. Moderate mitral valve stenosis. MV peak gradient, 31.5 mmHg. The mean mitral valve gradient is 10.0 mmHg. Tricuspid Valve: The tricuspid valve is normal in structure. Tricuspid valve regurgitation is not demonstrated. No evidence of tricuspid stenosis. Aortic Valve: The aortic valve is normal in structure. Aortic valve regurgitation is not visualized. No aortic stenosis is present. Aortic valve mean gradient measures 2.0 mmHg. Aortic valve peak gradient measures 5.1 mmHg. Aortic valve area, by VTI measures 2.72 cm. Pulmonic Valve: The pulmonic valve was normal in structure. Pulmonic valve regurgitation is mild. No evidence of pulmonic stenosis. Aorta: The aortic root is normal in size and structure. Venous: The inferior vena cava is dilated in size with less than 50% respiratory variability, suggesting right atrial pressure of 15 mmHg. IAS/Shunts: No atrial level shunt detected by color flow Doppler.  LEFT VENTRICLE PLAX 2D LVIDd:         5.30 cm LVIDs:         3.60 cm LV PW:         0.70 cm LV IVS:        0.90 cm LVOT diam:     2.00 cm LV SV:  68 LV SV Index:   43 LVOT Area:     3.14 cm  LV Volumes (MOD) LV vol d, MOD A2C: 60.5 ml LV vol d, MOD A4C: 80.9 ml LV vol s, MOD A2C: 31.1 ml  LV vol s, MOD A4C: 33.7 ml LV SV MOD A2C:     29.5 ml LV SV MOD A4C:     80.9 ml LV SV MOD BP:      40.5 ml RIGHT VENTRICLE             IVC RV S prime:     10.60 cm/s  IVC diam: 2.40 cm TAPSE (M-mode): 2.2 cm LEFT ATRIUM             Index LA diam:        4.45 cm 2.81 cm/m LA Vol (A2C):   59.3 ml 37.43 ml/m LA Vol (A4C):   55.0 ml 34.71 ml/m LA Biplane Vol: 60.9 ml 38.44 ml/m  AORTIC VALVE                   PULMONIC VALVE AV Area (Vmax):    2.75 cm    PV Vmax:       0.85 m/s AV Area (Vmean):   2.88 cm    PV Peak grad:  2.9 mmHg AV Area (VTI):     2.72 cm AV Vmax:           113.00 cm/s AV Vmean:          73.800 cm/s AV VTI:            0.252 m AV Peak Grad:      5.1 mmHg AV Mean Grad:      2.0 mmHg LVOT Vmax:         99.00 cm/s LVOT Vmean:        67.700 cm/s LVOT VTI:          0.218 m LVOT/AV VTI ratio: 0.87  AORTA Ao Arch diam: 2.1 cm MITRAL VALVE MV Peak grad: 31.5 mmHg  SHUNTS MV Mean grad: 10.0 mmHg  Systemic VTI:  0.22 m MV Vmax:      2.81 m/s   Systemic Diam: 2.00 cm MV Vmean:     145.0 cm/s Ida Rogue MD Electronically signed by Ida Rogue MD Signature Date/Time: 07/27/2020/7:56:29 PM    Final     Assessment/Plan Hypertension blood pressure control important in reducing the progression of atherosclerotic disease. On appropriate oral medications.   Peripheral neuropathy ([place]) Scheduled to see a neurologist in the near future and she is also going to discuss her dizziness at that time.  Cyst of thyroid Recently evaluated and felt to be benign.  Paroxysmal atrial fibrillation (H[facility]) Rate controlled and on anticoagulation  Carotid stenosis she has undergone a CT angiogram which I have independently reviewed. The official report of this is a 65% right ICA stenosis and a 60% left ICA stenosis. While I generally agree with the left although I think that is a bit of an overestimation, I think the right carotid stenosis is significantly worse than 65% and more in the high-grade range  that would correlate with the duplex she had prior to the CT scan. I had a long discussion today with she and her family regarding options for treatment. She has some symptoms that could certainly be related to her significant carotid disease which is bilateral. In general, if we have disparate findings between CT scan and duplex and angiogram was performed for the  most detailed evaluation of the carotid artery. I have presented her with three options today. One would be medical management alone which she does not desire. The second would be to proceed with open right carotid endarterectomy which I think is reasonable given her likely high-grade right carotid artery stenosis and symptoms that are referable to this. The third option would be an angiogram with the intention to perform carotid stenting at that time if greater than 70% disease is identified at the time of angiogram. Her anatomy is reasonable for carotid stenting and given her age and previous history of some cardiac issues, would avoid general anesthesia as well. She is on Eliquis and can continue this. We discussed the risks and benefits of the procedure including stroke, access site related complications, and renal insufficiency. The risk of surgery were also discussed with her today which would include a slightly higher risk of cardiac complications and wound complications and potentially a slightly lower risk of stroke. She would like to proceed with an angiogram with possible carotid stent placement if appropriate disease is encountered at the time of the angiogram. I think this is a reasonable option and likely the best option in her case.    Leotis Pain, MD  08/07/2020 12:46 PM    This note was created with Dragon medical transcription system.  Any errors from dictation are purely unintentional

## 2020-08-13 ENCOUNTER — Telehealth: Payer: Self-pay | Admitting: *Deleted

## 2020-08-13 ENCOUNTER — Other Ambulatory Visit
Admission: RE | Admit: 2020-08-13 | Discharge: 2020-08-13 | Disposition: A | Discharge status: Home / Self Care | Payer: Medicare Other | Source: Ambulatory Visit | Attending: Vascular Surgery | Admitting: Vascular Surgery

## 2020-08-13 ENCOUNTER — Other Ambulatory Visit: Payer: Self-pay

## 2020-08-13 DIAGNOSIS — Z801 Family history of malignant neoplasm of trachea, bronchus and lung: Secondary | ICD-10-CM | POA: Diagnosis not present

## 2020-08-13 DIAGNOSIS — H409 Unspecified glaucoma: Secondary | ICD-10-CM | POA: Diagnosis not present

## 2020-08-13 DIAGNOSIS — Z01812 Encounter for preprocedural laboratory examination: Secondary | ICD-10-CM | POA: Insufficient documentation

## 2020-08-13 DIAGNOSIS — Z79899 Other long term (current) drug therapy: Secondary | ICD-10-CM | POA: Diagnosis not present

## 2020-08-13 DIAGNOSIS — Z20822 Contact with and (suspected) exposure to covid-19: Secondary | ICD-10-CM | POA: Insufficient documentation

## 2020-08-13 DIAGNOSIS — J449 Chronic obstructive pulmonary disease, unspecified: Secondary | ICD-10-CM | POA: Diagnosis not present

## 2020-08-13 DIAGNOSIS — I10 Essential (primary) hypertension: Secondary | ICD-10-CM | POA: Diagnosis not present

## 2020-08-13 DIAGNOSIS — E785 Hyperlipidemia, unspecified: Secondary | ICD-10-CM | POA: Diagnosis not present

## 2020-08-13 DIAGNOSIS — G629 Polyneuropathy, unspecified: Secondary | ICD-10-CM | POA: Diagnosis not present

## 2020-08-13 DIAGNOSIS — Z7901 Long term (current) use of anticoagulants: Secondary | ICD-10-CM | POA: Diagnosis not present

## 2020-08-13 DIAGNOSIS — I480 Paroxysmal atrial fibrillation: Secondary | ICD-10-CM | POA: Diagnosis not present

## 2020-08-13 DIAGNOSIS — Z8249 Family history of ischemic heart disease and other diseases of the circulatory system: Secondary | ICD-10-CM | POA: Diagnosis not present

## 2020-08-13 DIAGNOSIS — Z881 Allergy status to other antibiotic agents status: Secondary | ICD-10-CM | POA: Diagnosis not present

## 2020-08-13 DIAGNOSIS — Z888 Allergy status to other drugs, medicaments and biological substances status: Secondary | ICD-10-CM | POA: Diagnosis not present

## 2020-08-13 DIAGNOSIS — Z7951 Long term (current) use of inhaled steroids: Secondary | ICD-10-CM | POA: Diagnosis not present

## 2020-08-13 DIAGNOSIS — E041 Nontoxic single thyroid nodule: Secondary | ICD-10-CM | POA: Diagnosis not present

## 2020-08-13 DIAGNOSIS — I6521 Occlusion and stenosis of right carotid artery: Secondary | ICD-10-CM | POA: Diagnosis not present

## 2020-08-13 DIAGNOSIS — Z9109 Other allergy status, other than to drugs and biological substances: Secondary | ICD-10-CM | POA: Diagnosis not present

## 2020-08-13 NOTE — Chronic Care Management (AMB) (Signed)
Chronic Care Management   Note  08/13/2020 Name: Molly Hodges MRN: [id number] DOB: 01-May-1932  [name] is a 86 y.o. year old female who is a primary care patient of Caryn Section, Kirstie Peri, MD. I reached out to [place] by phone today in response to a referral sent by [name]'s health plan.     [name] was given information about Chronic Care Management services today including:  1. CCM service includes personalized support from designated clinical staff supervised by her physician, including individualized plan of care and coordination with other care providers 2. 24/7 contact phone numbers for assistance for urgent and routine care needs. 3. Service will only be billed when office clinical staff spend 20 minutes or more in a month to coordinate care. 4. Only one practitioner may furnish and bill the service in a calendar month. 5. The patient may stop CCM services at any time (effective at the end of the month) by phone call to the office staff. 6. The patient will be responsible for cost sharing (co-pay) of up to 20% of the service fee (after annual deductible is met).  Patient did not agree to enrollment in care management services and does not wish to consider at this time.  Follow up plan:  Appropriate care team members and provider have been notified via electronic communication. The care management team is available to follow up with the patient after provider conversation with the patient regarding recommendation for care management engagement and subsequent re-referral to the care management team.   [facility]  Direct Dial: [phone]

## 2020-08-14 LAB — SARS CORONAVIRUS 2 (TAT 6-24 HRS): SARS Coronavirus 2: NEGATIVE

## 2020-08-15 ENCOUNTER — Encounter: Payer: Self-pay | Admitting: Vascular Surgery

## 2020-08-15 ENCOUNTER — Encounter
Admission: RE | Disposition: A | Discharge status: Home / Self Care | Payer: Self-pay | Source: Home / Self Care | Attending: Vascular Surgery

## 2020-08-15 ENCOUNTER — Other Ambulatory Visit: Payer: Self-pay

## 2020-08-15 ENCOUNTER — Inpatient Hospital Stay
Admission: RE | Admit: 2020-08-15 | Discharge: 2020-08-16 | DRG: 036 | Disposition: A | Discharge status: Home / Self Care | Payer: Medicare Other | Attending: Vascular Surgery | Admitting: Vascular Surgery

## 2020-08-15 ENCOUNTER — Other Ambulatory Visit (INDEPENDENT_AMBULATORY_CARE_PROVIDER_SITE_OTHER): Payer: Self-pay | Admitting: Nurse Practitioner

## 2020-08-15 DIAGNOSIS — Z20822 Contact with and (suspected) exposure to covid-19: Secondary | ICD-10-CM | POA: Diagnosis present

## 2020-08-15 DIAGNOSIS — Z888 Allergy status to other drugs, medicaments and biological substances status: Secondary | ICD-10-CM

## 2020-08-15 DIAGNOSIS — G629 Polyneuropathy, unspecified: Secondary | ICD-10-CM | POA: Diagnosis present

## 2020-08-15 DIAGNOSIS — I6521 Occlusion and stenosis of right carotid artery: Secondary | ICD-10-CM

## 2020-08-15 DIAGNOSIS — Z9109 Other allergy status, other than to drugs and biological substances: Secondary | ICD-10-CM

## 2020-08-15 DIAGNOSIS — E041 Nontoxic single thyroid nodule: Secondary | ICD-10-CM | POA: Diagnosis present

## 2020-08-15 DIAGNOSIS — I10 Essential (primary) hypertension: Secondary | ICD-10-CM | POA: Diagnosis present

## 2020-08-15 DIAGNOSIS — Z801 Family history of malignant neoplasm of trachea, bronchus and lung: Secondary | ICD-10-CM | POA: Diagnosis not present

## 2020-08-15 DIAGNOSIS — I480 Paroxysmal atrial fibrillation: Secondary | ICD-10-CM | POA: Diagnosis present

## 2020-08-15 DIAGNOSIS — Z7951 Long term (current) use of inhaled steroids: Secondary | ICD-10-CM

## 2020-08-15 DIAGNOSIS — E785 Hyperlipidemia, unspecified: Secondary | ICD-10-CM | POA: Diagnosis present

## 2020-08-15 DIAGNOSIS — H409 Unspecified glaucoma: Secondary | ICD-10-CM | POA: Diagnosis present

## 2020-08-15 DIAGNOSIS — Z79899 Other long term (current) drug therapy: Secondary | ICD-10-CM | POA: Diagnosis not present

## 2020-08-15 DIAGNOSIS — J449 Chronic obstructive pulmonary disease, unspecified: Secondary | ICD-10-CM | POA: Diagnosis present

## 2020-08-15 DIAGNOSIS — Z881 Allergy status to other antibiotic agents status: Secondary | ICD-10-CM | POA: Diagnosis not present

## 2020-08-15 DIAGNOSIS — Z8249 Family history of ischemic heart disease and other diseases of the circulatory system: Secondary | ICD-10-CM

## 2020-08-15 DIAGNOSIS — Z7901 Long term (current) use of anticoagulants: Secondary | ICD-10-CM

## 2020-08-15 HISTORY — DX: Disorder of arteries and arterioles, unspecified: I77.9

## 2020-08-15 HISTORY — PX: CAROTID PTA/STENT INTERVENTION: CATH118231

## 2020-08-15 HISTORY — DX: Hyperlipidemia, unspecified: E78.5

## 2020-08-15 LAB — POCT ACTIVATED CLOTTING TIME: Activated Clotting Time: 297 seconds

## 2020-08-15 LAB — CREATININE, SERUM
Creatinine, Ser: 0.74 mg/dL (ref 0.44–1.00)
GFR, Estimated: >60 mL/min (ref >60)

## 2020-08-15 LAB — BUN: BUN: 13 mg/dL (ref 8–23)

## 2020-08-15 SURGERY — CAROTID PTA/STENT INTERVENTION
Anesthesia: Moderate Sedation | Laterality: Right

## 2020-08-15 MED ORDER — ASPIRIN EC 81 MG PO TBEC: 81.0000 mg | DELAYED_RELEASE_TABLET | Freq: Every day | ORAL | Status: DC

## 2020-08-15 MED ORDER — ASCORBIC ACID 500 MG PO TABS
1000.0000 mg | ORAL_TABLET | Freq: Every day | ORAL | Status: DC
  Administered 2020-08-16: 10:00:00 1000 mg via ORAL
  Filled 2020-08-15 (×2): qty 2

## 2020-08-15 MED ORDER — GABAPENTIN 300 MG PO CAPS: 300.0000 mg | ORAL_CAPSULE | Freq: Two times a day (BID) | ORAL | Status: DC

## 2020-08-15 MED ORDER — DIPHENHYDRAMINE HCL 50 MG/ML IJ SOLN
INTRAMUSCULAR | Status: AC
  Filled 2020-08-15: qty 1

## 2020-08-15 MED ORDER — CLOPIDOGREL BISULFATE 75 MG PO TABS
75.0000 mg | ORAL_TABLET | Freq: Every day | ORAL | Status: DC
  Administered 2020-08-15: 11:00:00 75 mg via ORAL
  Filled 2020-08-15: qty 1

## 2020-08-15 MED ORDER — ONDANSETRON HCL 4 MG/2ML IJ SOLN: 4.0000 mg | Freq: Four times a day (QID) | INTRAMUSCULAR | Status: DC | PRN

## 2020-08-15 MED ORDER — HYDROMORPHONE HCL 1 MG/ML IJ SOLN: 1.0000 mg | Freq: Once | INTRAMUSCULAR | Status: DC | PRN

## 2020-08-15 MED ORDER — MAGNESIUM OXIDE 400 (241.3 MG) MG PO TABS
400.0000 mg | ORAL_TABLET | Freq: Every day | ORAL | Status: DC
  Administered 2020-08-16: 10:00:00 400 mg via ORAL
  Filled 2020-08-15 (×2): qty 1

## 2020-08-15 MED ORDER — ACETAMINOPHEN 325 MG RE SUPP
325.0000 mg | RECTAL | Status: DC | PRN
  Filled 2020-08-15: qty 2

## 2020-08-15 MED ORDER — CLOPIDOGREL BISULFATE 75 MG PO TABS
ORAL_TABLET | ORAL | Status: AC
  Administered 2020-08-16: 06:00:00 75 mg via ORAL
  Filled 2020-08-15: qty 1

## 2020-08-15 MED ORDER — ATROPINE SULFATE 1 MG/10ML IJ SOSY
PREFILLED_SYRINGE | INTRAMUSCULAR | Status: DC | PRN
  Administered 2020-08-15: 0.5 mg via INTRAVENOUS

## 2020-08-15 MED ORDER — AMIODARONE HCL 200 MG PO TABS
200.0000 mg | ORAL_TABLET | Freq: Every day | ORAL | Status: DC
  Administered 2020-08-16: 10:00:00 200 mg via ORAL
  Filled 2020-08-15 (×2): qty 1

## 2020-08-15 MED ORDER — FAMOTIDINE 20 MG IN NS 100 ML IVPB
20.0000 mg | Freq: Two times a day (BID) | INTRAVENOUS | Status: DC
  Administered 2020-08-15 – 2020-08-16 (×2): 20 mg via INTRAVENOUS
  Filled 2020-08-15 (×5): qty 100

## 2020-08-15 MED ORDER — GABAPENTIN 300 MG PO CAPS
300.0000 mg | ORAL_CAPSULE | Freq: Two times a day (BID) | ORAL | Status: DC
  Administered 2020-08-15: 21:00:00 300 mg via ORAL

## 2020-08-15 MED ORDER — METHYLPREDNISOLONE SODIUM SUCC 125 MG IJ SOLR: 125.0000 mg | Freq: Once | INTRAMUSCULAR | Status: AC | PRN

## 2020-08-15 MED ORDER — GABAPENTIN 300 MG PO CAPS
ORAL_CAPSULE | ORAL | Status: AC
  Filled 2020-08-15: qty 1

## 2020-08-15 MED ORDER — FAMOTIDINE 20 MG PO TABS: 40.0000 mg | ORAL_TABLET | Freq: Once | ORAL | Status: AC | PRN

## 2020-08-15 MED ORDER — DOPAMINE-DEXTROSE 3.2-5 MG/ML-% IV SOLN
INTRAVENOUS | Status: AC
  Filled 2020-08-15: qty 250

## 2020-08-15 MED ORDER — CLOPIDOGREL BISULFATE 75 MG PO TABS: 75.0000 mg | ORAL_TABLET | Freq: Every day | ORAL | Status: DC

## 2020-08-15 MED ORDER — ALUM & MAG HYDROXIDE-SIMETH 200-200-20 MG/5ML PO SUSP
15.0000 mL | ORAL | Status: DC | PRN
Start: 2020-08-15 — End: 2020-08-16

## 2020-08-15 MED ORDER — METOPROLOL TARTRATE 5 MG/5ML IV SOLN: 2.0000 mg | INTRAVENOUS | Status: DC | PRN

## 2020-08-15 MED ORDER — MOMETASONE FURO-FORMOTEROL FUM 200-5 MCG/ACT IN AERO
2.0000 | INHALATION_SPRAY | Freq: Two times a day (BID) | RESPIRATORY_TRACT | Status: DC
  Administered 2020-08-16: 08:00:00 2 via RESPIRATORY_TRACT
  Filled 2020-08-15: qty 8.8

## 2020-08-15 MED ORDER — METHYLPREDNISOLONE SODIUM SUCC 125 MG IJ SOLR
INTRAMUSCULAR | Status: AC
  Administered 2020-08-15: 08:00:00 125 mg via INTRAVENOUS
  Filled 2020-08-15: qty 2

## 2020-08-15 MED ORDER — ROSUVASTATIN CALCIUM 20 MG PO TABS
20.0000 mg | ORAL_TABLET | Freq: Every day | ORAL | Status: DC
  Administered 2020-08-16: 10:00:00 20 mg via ORAL
  Filled 2020-08-15 (×2): qty 1

## 2020-08-15 MED ORDER — ATROPINE SULFATE 1 MG/10ML IJ SOSY
PREFILLED_SYRINGE | INTRAMUSCULAR | Status: AC
  Filled 2020-08-15: qty 10

## 2020-08-15 MED ORDER — MORPHINE SULFATE (PF) 4 MG/ML IV SOLN
2.0000 mg | INTRAVENOUS | Status: DC | PRN
Start: 2020-08-15 — End: 2020-08-16

## 2020-08-15 MED ORDER — FAMOTIDINE 20 MG PO TABS
ORAL_TABLET | ORAL | Status: AC
  Administered 2020-08-15: 08:00:00 40 mg via ORAL
  Filled 2020-08-15: qty 2

## 2020-08-15 MED ORDER — OXYCODONE-ACETAMINOPHEN 5-325 MG PO TABS
1.0000 | ORAL_TABLET | ORAL | Status: DC | PRN
  Administered 2020-08-15: 1 via ORAL

## 2020-08-15 MED ORDER — PHENYLEPHRINE HCL (PRESSORS) 10 MG/ML IV SOLN
INTRAVENOUS | Status: AC
  Filled 2020-08-15: qty 1

## 2020-08-15 MED ORDER — DORZOLAMIDE HCL 2 % OP SOLN
1.0000 [drp] | Freq: Two times a day (BID) | OPHTHALMIC | Status: DC
  Administered 2020-08-15 – 2020-08-16 (×2): 1 [drp] via OPHTHALMIC
  Filled 2020-08-15: qty 10

## 2020-08-15 MED ORDER — ASPIRIN EC 81 MG PO TBEC
81.0000 mg | DELAYED_RELEASE_TABLET | Freq: Every day | ORAL | Status: DC
  Administered 2020-08-16: 06:00:00 81 mg via ORAL
  Filled 2020-08-15: qty 1

## 2020-08-15 MED ORDER — VITAMIN E 45 MG (100 UNIT) PO CAPS
200.0000 [IU] | ORAL_CAPSULE | Freq: Every day | ORAL | Status: DC
  Administered 2020-08-16: 10:00:00 200 [IU] via ORAL
  Filled 2020-08-15 (×2): qty 2

## 2020-08-15 MED ORDER — GUAIFENESIN-DM 100-10 MG/5ML PO SYRP
15.0000 mL | ORAL_SOLUTION | ORAL | Status: DC | PRN
  Filled 2020-08-15: qty 15

## 2020-08-15 MED ORDER — HYDROCHLOROTHIAZIDE 12.5 MG PO CAPS
12.5000 mg | ORAL_CAPSULE | Freq: Every day | ORAL | Status: DC
  Administered 2020-08-16: 10:00:00 12.5 mg via ORAL
  Filled 2020-08-15 (×2): qty 1

## 2020-08-15 MED ORDER — FENTANYL CITRATE (PF) 100 MCG/2ML IJ SOLN
INTRAMUSCULAR | Status: AC
  Filled 2020-08-15: qty 2

## 2020-08-15 MED ORDER — FENTANYL CITRATE (PF) 100 MCG/2ML IJ SOLN
INTRAMUSCULAR | Status: DC | PRN
  Administered 2020-08-15: 50 ug via INTRAVENOUS

## 2020-08-15 MED ORDER — POTASSIUM CHLORIDE CRYS ER 10 MEQ PO TBCR
10.0000 meq | EXTENDED_RELEASE_TABLET | Freq: Every day | ORAL | Status: DC
  Administered 2020-08-16: 10:00:00 10 meq via ORAL
  Filled 2020-08-15 (×2): qty 1

## 2020-08-15 MED ORDER — SODIUM CHLORIDE 0.9 % IV SOLN: 500.0000 mL | Freq: Once | INTRAVENOUS | Status: DC | PRN

## 2020-08-15 MED ORDER — MIDAZOLAM HCL 5 MG/5ML IJ SOLN
INTRAMUSCULAR | Status: AC
  Filled 2020-08-15: qty 5

## 2020-08-15 MED ORDER — CEFAZOLIN SODIUM-DEXTROSE 2-4 GM/100ML-% IV SOLN
INTRAVENOUS | Status: AC
  Administered 2020-08-15: 08:00:00 2 g via INTRAVENOUS
  Filled 2020-08-15: qty 100

## 2020-08-15 MED ORDER — HEPARIN SODIUM (PORCINE) 1000 UNIT/ML IJ SOLN
INTRAMUSCULAR | Status: DC | PRN
  Administered 2020-08-15: 7000 [IU] via INTRAVENOUS

## 2020-08-15 MED ORDER — DIPHENHYDRAMINE HCL 50 MG/ML IJ SOLN
50.0000 mg | Freq: Once | INTRAMUSCULAR | Status: AC | PRN
  Administered 2020-08-15: 08:00:00 50 mg via INTRAVENOUS

## 2020-08-15 MED ORDER — LATANOPROST 0.005 % OP SOLN
1.0000 [drp] | Freq: Every day | OPHTHALMIC | Status: DC
  Administered 2020-08-15: 22:00:00 1 [drp] via OPHTHALMIC
  Filled 2020-08-15: qty 2.5

## 2020-08-15 MED ORDER — MIDAZOLAM HCL 2 MG/ML PO SYRP: 8.0000 mg | ORAL_SOLUTION | Freq: Once | ORAL | Status: DC | PRN

## 2020-08-15 MED ORDER — MOMETASONE FURO-FORMOTEROL FUM 200-5 MCG/ACT IN AERO: 2.0000 | INHALATION_SPRAY | Freq: Two times a day (BID) | RESPIRATORY_TRACT | Status: DC

## 2020-08-15 MED ORDER — CEFAZOLIN SODIUM-DEXTROSE 2-4 GM/100ML-% IV SOLN: 2.0000 g | Freq: Three times a day (TID) | INTRAVENOUS | Status: AC

## 2020-08-15 MED ORDER — CEFAZOLIN SODIUM-DEXTROSE 2-4 GM/100ML-% IV SOLN: 2.0000 g | Freq: Once | INTRAVENOUS | Status: AC

## 2020-08-15 MED ORDER — CEFAZOLIN SODIUM-DEXTROSE 2-4 GM/100ML-% IV SOLN
INTRAVENOUS | Status: AC
  Administered 2020-08-15: 16:00:00 2 g via INTRAVENOUS
  Filled 2020-08-15: qty 100

## 2020-08-15 MED ORDER — HEPARIN SODIUM (PORCINE) 1000 UNIT/ML IJ SOLN
INTRAMUSCULAR | Status: AC
  Filled 2020-08-15: qty 1

## 2020-08-15 MED ORDER — LATANOPROST 0.005 % OP SOLN: 1.0000 [drp] | Freq: Every day | OPHTHALMIC | Status: DC

## 2020-08-15 MED ORDER — ALBUTEROL SULFATE (2.5 MG/3ML) 0.083% IN NEBU: 2.5000 mg | INHALATION_SOLUTION | RESPIRATORY_TRACT | Status: DC | PRN

## 2020-08-15 MED ORDER — POTASSIUM CHLORIDE CRYS ER 20 MEQ PO TBCR: 20.0000 meq | EXTENDED_RELEASE_TABLET | Freq: Every day | ORAL | Status: DC | PRN

## 2020-08-15 MED ORDER — LEVALBUTEROL HCL 1.25 MG/0.5ML IN NEBU
1.2500 mg | INHALATION_SOLUTION | RESPIRATORY_TRACT | Status: DC | PRN
  Filled 2020-08-15: qty 0.5

## 2020-08-15 MED ORDER — EZETIMIBE 10 MG PO TABS
10.0000 mg | ORAL_TABLET | Freq: Every day | ORAL | Status: DC
  Administered 2020-08-16: 10:00:00 10 mg via ORAL
  Filled 2020-08-15 (×2): qty 1

## 2020-08-15 MED ORDER — ACETAMINOPHEN 325 MG PO TABS: 325.0000 mg | ORAL_TABLET | ORAL | Status: DC | PRN

## 2020-08-15 MED ORDER — CALCIUM CARBONATE-VITAMIN D 500-200 MG-UNIT PO TABS
1.0000 | ORAL_TABLET | Freq: Every day | ORAL | Status: DC
  Administered 2020-08-16: 10:00:00 1 via ORAL
  Filled 2020-08-15 (×2): qty 1

## 2020-08-15 MED ORDER — CHOLECALCIFEROL 10 MCG (400 UNIT) PO TABS
1000.0000 [IU] | ORAL_TABLET | Freq: Every day | ORAL | Status: DC
  Administered 2020-08-16: 10:00:00 1000 [IU] via ORAL
  Filled 2020-08-15 (×2): qty 3

## 2020-08-15 MED ORDER — DILTIAZEM HCL ER COATED BEADS 180 MG PO CP24
180.0000 mg | ORAL_CAPSULE | Freq: Every day | ORAL | Status: DC
  Administered 2020-08-16: 10:00:00 180 mg via ORAL
  Filled 2020-08-15 (×2): qty 1

## 2020-08-15 MED ORDER — MIDAZOLAM HCL 2 MG/2ML IJ SOLN
INTRAMUSCULAR | Status: DC | PRN
  Administered 2020-08-15: 1 mg via INTRAVENOUS

## 2020-08-15 MED ORDER — HYDRALAZINE HCL 20 MG/ML IJ SOLN: 5.0000 mg | INTRAMUSCULAR | Status: DC | PRN

## 2020-08-15 MED ORDER — SODIUM CHLORIDE 0.9 % IV SOLN: INTRAVENOUS | Status: DC

## 2020-08-15 MED ORDER — APIXABAN 5 MG PO TABS: 5.0000 mg | ORAL_TABLET | Freq: Two times a day (BID) | ORAL | Status: DC

## 2020-08-15 MED ORDER — IODIXANOL 320 MG/ML IV SOLN
INTRAVENOUS | Status: DC | PRN
  Administered 2020-08-15: 09:00:00 45 mL via INTRA_ARTERIAL

## 2020-08-15 MED ORDER — LABETALOL HCL 5 MG/ML IV SOLN: 10.0000 mg | INTRAVENOUS | Status: DC | PRN

## 2020-08-15 MED ORDER — MAGNESIUM SULFATE 2 GM/50ML IV SOLN
2.0000 g | Freq: Every day | INTRAVENOUS | Status: DC | PRN
  Filled 2020-08-15: qty 50

## 2020-08-15 MED ORDER — OXYCODONE-ACETAMINOPHEN 5-325 MG PO TABS
ORAL_TABLET | ORAL | Status: AC
  Filled 2020-08-15: qty 1

## 2020-08-15 MED ORDER — ALBUTEROL SULFATE HFA 108 (90 BASE) MCG/ACT IN AERS
2.0000 | INHALATION_SPRAY | Freq: Four times a day (QID) | RESPIRATORY_TRACT | Status: DC | PRN
  Filled 2020-08-15: qty 6.7

## 2020-08-15 MED ORDER — PHENOL 1.4 % MT LIQD
1.0000 | OROMUCOSAL | Status: DC | PRN
  Filled 2020-08-15: qty 177

## 2020-08-15 MED ORDER — APIXABAN 5 MG PO TABS
5.0000 mg | ORAL_TABLET | Freq: Two times a day (BID) | ORAL | Status: DC
  Administered 2020-08-16: 10:00:00 5 mg via ORAL
  Filled 2020-08-15 (×3): qty 1

## 2020-08-15 SURGICAL SUPPLY — 16 items
BALLN VIATRAC 5X30X135 (BALLOONS) ×2
BALLOON VIATRAC 5X30X135 (BALLOONS) ×1 IMPLANT
CATH ANGIO 5F 100CM .035 PIG (CATHETERS) ×2 IMPLANT
CATH BEACON 5 .035 100 H1 TIP (CATHETERS) ×2 IMPLANT
DEVICE EMBOSHIELD NAV6 4.0-7.0 (FILTER) ×2 IMPLANT
DEVICE STARCLOSE SE CLOSURE (Vascular Products) ×2 IMPLANT
DEVICE TORQUE .025-.038 (MISCELLANEOUS) ×2 IMPLANT
GLIDEWIRE ANGLED SS 035X260CM (WIRE) ×2 IMPLANT
KIT CAROTID MANIFOLD (MISCELLANEOUS) ×2 IMPLANT
KIT ENCORE 26 ADVANTAGE (KITS) ×2 IMPLANT
PACK ANGIOGRAPHY (CUSTOM PROCEDURE TRAY) ×2 IMPLANT
SHEATH BRITE TIP 5FRX11 (SHEATH) ×2 IMPLANT
SHEATH SHUTTLE SELECT 6F (SHEATH) ×2 IMPLANT
STENT XACT CAR 9-7X40X136 (Permanent Stent) ×2 IMPLANT
WIRE G VAS 035X260 STIFF (WIRE) ×2 IMPLANT
WIRE GUIDERIGHT .035X150 (WIRE) ×2 IMPLANT

## 2020-08-15 NOTE — Plan of Care (Signed)
Patient just arrived to the unit. I will continue to educate her on all the criteria she needs to be discharged.

## 2020-08-15 NOTE — Interval H&P Note (Signed)
History and Physical Interval Note:  08/15/2020 8:12 AM  Molly Hodges  has presented today for surgery, with the diagnosis of RT Carotid Stent    ABBOTT   Carotid artery stenosis  Carotid  Dec 27.  The various methods of treatment have been discussed with the patient and family. After consideration of risks, benefits and other options for treatment, the patient has consented to  Procedure(s): CAROTID PTA/STENT INTERVENTION (Right) as a surgical intervention.  The patient's history has been reviewed, patient examined, no change in status, stable for surgery.  I have reviewed the patient's chart and labs.  Questions were answered to the patient's satisfaction.     Festus Barren

## 2020-08-15 NOTE — Op Note (Signed)
OPERATIVE NOTE DATE: 08/15/2020  PROCEDURE:  Ultrasound guidance for vascular access right femoral artery  Placement of a 38mm by 7 mm, 4 cm long Exact stent with the use of the NAV-6 embolic protection device in the right carotid artery  PRE-OPERATIVE DIAGNOSIS: 1. High grade right carotid artery stenosis. 2. Dizziness and pre-syncope  POST-OPERATIVE DIAGNOSIS:  Same as above  SURGEON: Festus Barren, MD  ASSISTANT(S):  Louisa Second, MD  ANESTHESIA: local/MCS  ESTIMATED BLOOD LOSS:  10 cc  CONTRAST: 45 cc  FLUORO TIME: 5 minutes  MODERATE CONSCIOUS SEDATION TIME:  Approximately 35 minutes using 1 mg of Versed and 50 mcg of Fentanyl  FINDING(S): 1.   85 to 90% irregular right carotid artery stenosis carotid artery stenosis  SPECIMEN(S):   none  INDICATIONS:   Patient is a 86 y.o. female who presents with right carotid artery stenosis. She has had severe dizziness and presyncopal episodes. She had a carotid duplex which suggested very high-grade right carotid artery stenosis and more mild left carotid artery stenosis. She had a CT scan which was officially read as 60 to 65% bilateral carotid artery stenosis although it appeared worse than that to my interpretation. With the disparate findings, catheter-based angiogram is indicated. The patient has a relatively high lesion and already has to undergo the angiogram and carotid artery stenting was felt to be preferred to endarterectomy for that reason.  Risks and benefits were discussed and informed consent was obtained.   DESCRIPTION: After obtaining full informed written consent, the patient was brought back to the vascular suite and placed supine upon the table.  The patient received IV antibiotics prior to induction. Moderate conscious sedation was administered during a face to face encounter with the patient throughout the procedure with my supervision of the RN administering medicines and monitoring the patients vital signs and  mental status throughout from the start of the procedure until the patient was taken to the recovery room.  After obtaining adequate anesthesia, the patient was prepped and draped in the standard fashion.   The right femoral artery was visualized with ultrasound and found to be widely patent. It was then accessed under direct ultrasound guidance without difficulty with a Seldinger needle. A permanent image was recorded. A J-wire was placed and we then placed a 6 French sheath. The patient was then heparinized and a total of 7000 units of intravenous heparin were given and an ACT was checked to confirm successful anticoagulation. A pigtail catheter was then placed into the ascending aorta. This showed a type I aortic arch with normal origins of the great vessels. I then selectively cannulated the innominate artery without difficulty with a headhunter catheter and advanced into the mid right common carotid artery.  Cervical and cerebral carotid angiography was then performed. There were no obvious intracranial filling defects with cross-filling right to left. The carotid bifurcation demonstrated a very irregular 85 to 90% right carotid artery stenosis beginning at the bifurcation and then extending about 1-1/2 to 2 cm more superior to the bifurcation.  I then advanced into the external carotid artery with a Glidewire and the headhunter catheter and then exchanged for the Amplatz Super Stiff wire. Over the Amplatz Super Stiff wire, a 6 [place] shuttle sheath was placed into the mid common carotid artery. I then used the NAV-6  Embolic protection device and crossed the lesion and parked this in the distal internal carotid artery at the base of the skull.  I then selected a 9  mm proximal, 7 mm distal, 4 cm long exact stent. This was deployed across the lesion encompassing it in its entirety. A 5 mm diameter by 3 cm length balloon was used to post dilate the stent. Only about a 15-20% residual stenosis was present after  angioplasty. Completion angiogram showed normal intracranial filling without new defects. At this point I elected to terminate the procedure. The sheath was removed and StarClose closure device was deployed in the right femoral artery with excellent hemostatic result. The patient was taken to the recovery room in stable condition having tolerated the procedure well.  COMPLICATIONS: none  CONDITION: stable  Festus Barren 08/15/2020 9:19 AM   This note was created with Dragon Medical transcription system. Any errors in dictation are purely unintentional.

## 2020-08-16 ENCOUNTER — Encounter: Payer: Self-pay | Admitting: Vascular Surgery

## 2020-08-16 LAB — CBC
HCT: 32.7 % — ABNORMAL LOW (ref 36.0–46.0)
Hemoglobin: 10.5 g/dL — ABNORMAL LOW (ref 12.0–15.0)
MCH: 24.3 pg — ABNORMAL LOW (ref 26.0–34.0)
MCHC: 32.1 g/dL (ref 30.0–36.0)
MCV: 75.7 fL — ABNORMAL LOW (ref 80.0–100.0)
Platelets: 192 10*3/uL (ref 150–400)
RBC: 4.32 MIL/uL (ref 3.87–5.11)
RDW: 16.4 % — ABNORMAL HIGH (ref 11.5–15.5)
WBC: 13.5 10*3/uL — ABNORMAL HIGH (ref 4.0–10.5)
nRBC: 0 % (ref 0.0–0.2)

## 2020-08-16 LAB — BASIC METABOLIC PANEL
Anion gap: 7 (ref 5–15)
BUN: 18 mg/dL (ref 8–23)
CO2: 23 mmol/L (ref 22–32)
Calcium: 8.2 mg/dL — ABNORMAL LOW (ref 8.9–10.3)
Chloride: 108 mmol/L (ref 98–111)
Creatinine, Ser: 0.76 mg/dL (ref 0.44–1.00)
GFR, Estimated: >60 mL/min (ref >60)
Glucose, Bld: 170 mg/dL — ABNORMAL HIGH (ref 70–99)
Potassium: 4 mmol/L (ref 3.5–5.1)
Sodium: 138 mmol/L (ref 135–145)

## 2020-08-16 MED ORDER — GABAPENTIN 300 MG PO CAPS
ORAL_CAPSULE | ORAL | Status: AC
  Administered 2020-08-16: 10:00:00 300 mg via ORAL
  Filled 2020-08-16: qty 1

## 2020-08-16 MED ORDER — ASPIRIN 81 MG PO TBEC: 81.0000 mg | DELAYED_RELEASE_TABLET | Freq: Every day | ORAL | 11 refills | Status: DC

## 2020-08-16 MED ORDER — CLOPIDOGREL BISULFATE 75 MG PO TABS: 75.0000 mg | ORAL_TABLET | Freq: Every day | ORAL | 2 refills | Status: DC

## 2020-08-16 MED ORDER — CEFAZOLIN SODIUM-DEXTROSE 2-4 GM/100ML-% IV SOLN
INTRAVENOUS | Status: AC
  Administered 2020-08-16: 2 g via INTRAVENOUS
  Filled 2020-08-16: qty 100

## 2020-08-16 NOTE — Discharge Summary (Signed)
[facility]    Discharge Summary    Patient ID:  Molly Hodges MRN: [id number] DOB/AGE: Jul 23, 1932 86 y.o.  Admit date: 08/15/2020 Discharge date: 08/16/2020 Date of Surgery: 08/15/2020 Surgeon: Surgeon(s): Annice Needy, MD  Admission Diagnosis: Carotid stenosis, right [I65.21]  Discharge Diagnoses:  Carotid stenosis, right [I65.21]  Secondary Diagnoses: Past Medical History:  Diagnosis Date  . Asthma   . Carotid artery disease (HCC)   . Community acquired pneumonia   . COPD (chronic obstructive pulmonary disease) (HCC)   . Essential hypertension   . Glaucoma   . Hyperlipidemia   . Mitral regurgitation    a. TTE 08/2015: EF 60-65%, normal wall motion, mild MR, mildly dilated left atrium measuring 40 mm, RVSF normal, PASP normal  . PAF (paroxysmal atrial fibrillation) (HCC) 09/17/2015   a. s/p DCCV 11/15/2015; CHADS2VASc => 4 (HTN, age x 2, female); c. on Eliquis    Procedure(s): CAROTID PTA/STENT INTERVENTION  Discharged Condition: good  HPI:  Patient with severe dizziness and presyncope and carotid stenosis  Hospital Course:  Molly Hodges is a 86 y.o. female is S/P Right Procedure(s): CAROTID PTA/STENT INTERVENTION Extubated: NA Physical exam: normal neuro exam Post-op wounds clean, dry, intact or healing well Pt. Ambulating, voiding and taking PO diet without difficulty. Pt pain controlled with PO pain meds. Labs as below Complications:none  Consults:    Significant Diagnostic Studies: CBC Lab Results  Component Value Date   WBC 13.5 (H) 08/16/2020   HGB 10.5 (L) 08/16/2020   HCT 32.7 (L) 08/16/2020   MCV 75.7 (L) 08/16/2020   PLT 192 08/16/2020    BMET    Component Value Date/Time   NA 138 08/16/2020 0629   NA 139 11/30/2019 1022   NA 143 05/10/2012 1100   K 4.0 08/16/2020 0629   K 3.7 05/10/2012 1100   CL 108 08/16/2020 0629   CL 104 05/10/2012 1100   CO2 23 08/16/2020 0629   CO2 29 05/10/2012 1100    GLUCOSE 170 (H) 08/16/2020 0629   GLUCOSE 106 (H) 05/10/2012 1100   BUN 18 08/16/2020 0629   BUN 16 11/30/2019 1022   BUN 17 05/10/2012 1100   CREATININE 0.76 08/16/2020 0629   CREATININE 0.99 05/10/2012 1100   CALCIUM 8.2 (L) 08/16/2020 0629   CALCIUM 10.2 (H) 05/10/2012 1100   GFRNONAA >60 08/16/2020 0629   GFRNONAA 55 (L) 05/10/2012 1100   GFRAA >60 05/14/2020 0918   GFRAA >60 05/10/2012 1100   COAG Lab Results  Component Value Date   INR 1.28 11/09/2015     Disposition:  Discharge to :Home Discharge Instructions    Call MD for:  redness, tenderness, or signs of infection (pain, swelling, bleeding, redness, odor or green/yellow discharge around incision site)   Complete by: As directed    Call MD for:  severe or increased pain, loss or decreased feeling  in affected limb(s)   Complete by: As directed    Call MD for:  temperature >100.5   Complete by: As directed    Driving Restrictions   Complete by: As directed    No driving for 24 hours   Lifting restrictions   Complete by: As directed    No lifting for 24 hours   No dressing needed   Complete by: As directed    Replace only if drainage present   Resume previous diet   Complete by: As directed      Allergies as of 08/16/2020  Reactions   Shellfish Allergy Anaphylaxis   Albuterol    Heart racing.    Azithromycin Other (See Comments)   Extreme burning sensation at IV site   Tamiflu [oseltamivir Phosphate] Other (See Comments)   Reaction:  Hallucinations  Reaction:  Hallucinations       Medication List    TAKE these medications   albuterol 108 (90 Base) MCG/ACT inhaler Commonly known as: VENTOLIN HFA Inhale 2 puffs into the lungs every 6 (six) hours as needed for wheezing or shortness of breath.   albuterol (2.5 MG/3ML) 0.083% nebulizer solution Commonly known as: PROVENTIL Take 3 mLs (2.5 mg total) by nebulization every 4 (four) hours as needed for up to 12 doses for wheezing or shortness of  breath.   amiodarone 200 MG tablet Commonly known as: PACERONE TAKE ONE TABLET EVERY DAY   apixaban 5 MG Tabs tablet Commonly known as: Eliquis Take 1 tablet (5 mg total) by mouth 2 (two) times daily.   aspirin 81 MG EC tablet Take 1 tablet (81 mg total) by mouth daily at 6 (six) AM. Swallow whole. Start taking on: August 17, 2020   budesonide-formoterol 160-4.5 MCG/ACT inhaler Commonly known as: SYMBICORT Inhale 2 puffs into the lungs 2 (two) times daily.   Calcium Carbonate-Vitamin D 600-400 MG-UNIT tablet Take 1 tablet by mouth daily.   cholecalciferol 1000 units tablet Commonly known as: VITAMIN D Take 1,000 Units by mouth daily.   clopidogrel 75 MG tablet Commonly known as: PLAVIX Take 1 tablet (75 mg total) by mouth daily at 6 (six) AM. Start taking on: August 17, 2020   diltiazem 180 MG 24 hr capsule Commonly known as: CARDIZEM CD TAKE 1 CAPSULE EVERY DAY   dorzolamide 2 % ophthalmic solution Commonly known as: TRUSOPT Place 1 drop into both eyes 2 (two) times daily.   ezetimibe 10 MG tablet Commonly known as: ZETIA Take 1 tablet (10 mg total) by mouth daily.   Fish Oil Burp-Less 1000 MG Caps Take 1,000 mg by mouth daily.   gabapentin 300 MG capsule Commonly known as: NEURONTIN TAKE 1 CAPSULE EVERY MORNING AND TAKE 2 CAPSULES AT BEDTIME What changed: See the new instructions.   hydrochlorothiazide 12.5 MG capsule Commonly known as: MICROZIDE TAKE 1 CAPSULE BY MOUTH EVERY DAY What changed: how much to take   latanoprost 0.005 % ophthalmic solution Commonly known as: XALATAN Place 1 drop into both eyes at bedtime.   levalbuterol 45 MCG/ACT inhaler Commonly known as: XOPENEX HFA Inhale 2 puffs into the lungs every 4 (four) hours as needed for wheezing.   magnesium oxide 400 MG tablet Commonly known as: MAG-OX Take 400 mg by mouth daily.   meclizine 12.5 MG tablet Commonly known as: ANTIVERT Take 1 tablet (12.5 mg total) by mouth 2 (two)  times daily as needed for dizziness.   potassium chloride 10 MEQ tablet Commonly known as: KLOR-CON Take 1 tablet (10 mEq total) by mouth daily.   rosuvastatin 20 MG tablet Commonly known as: CRESTOR Take 1 tablet (20 mg total) by mouth daily.   vitamin C 500 MG tablet Commonly known as: ASCORBIC ACID Take 1,000 mg by mouth daily.   vitamin E 45 MG (100 UNITS) capsule Take 200 Units by mouth daily.      Verbal and written Discharge instructions given to the patient. Wound care per Discharge AVS  Follow-up Information    Georgiana Spinner, NP In 3 weeks.   Specialty: Vascular Surgery Why: with carotid duplex Contact information:  [address] [place] [address] [phone]               Signed: Leotis Pain, MD  08/16/2020, 11:50 AM

## 2020-08-16 NOTE — Plan of Care (Signed)
Patient was able to meet requirements to be discharged.

## 2020-08-16 NOTE — Discharge Instructions (Signed)
Carotid Artery Disease  Carotid artery disease is the narrowing or blockage of one or both carotid arteries. This condition is also called carotid artery stenosis. The carotid arteries are the two main blood vessels on either side of the neck. They send blood to the brain, other parts of the head, and the neck.  This condition increases your risk for a stroke or a transient ischemic attack (TIA). A TIA is a "mini-stroke" that causes stroke-like symptoms that go away quickly. What are the causes? This condition is mainly caused by a narrowing and hardening of the carotid arteries. The carotid arteries can become narrow or clogged with a buildup of plaque. Plaque includes:  Fat.  Cholesterol.  Calcium.  Other substances. What increases the risk? The following factors may make you more likely to develop this condition:  Having certain medical conditions, such as: ? High cholesterol. ? High blood pressure. ? Diabetes. ? Obesity.  Smoking.  A family history of cardiovascular disease.  Not being active or lack of regular exercise.  Being female. Men have a higher risk of having arteries become narrow and harden earlier in life than women.  Old age. What are the signs or symptoms? This condition may not have any signs or symptoms until a stroke or TIA happens. In some cases, your doctor may be able to hear a whooshing sound. This can suggest a change in blood flow caused by plaque buildup. An eye exam can also help find signs of the condition. How is this treated? This condition may be treated with more than one treatment. Treatment options include:  Lifestyle changes, such as: ? Quitting smoking. ? Getting regular exercise, or getting exercise as told by your doctor. ? Eating a healthy diet. ? Managing stress. ? Keeping a healthy weight.  Medicines to control: ? Blood pressure. ? Cholesterol. ? Blood clotting.  Surgery. You may have: ? A surgery to remove the blockages in  the carotid arteries. ? A procedure in which a small mesh tube (stent) is used to widen the blocked carotid arteries. Follow these instructions at home: Eating and drinking Follow instructions about your diet from your doctor. It is important to follow a healthy diet.  Eat a diet that includes: ? A lot of fresh fruits and vegetables. ? Low-fat (lean) meats.  Avoid these foods: ? Foods that are high in fat. ? Foods that are high in salt (sodium). ? Foods that are fried. ? Foods that are processed. ? Foods that have few good nutrients (poor nutritional value).  Lifestyle   Keep a healthy weight.  Do exercises as told by your doctor to stay active. Each week, you should get one of the following: ? At least 150 minutes of exercise that raises your heart rate and makes you sweat (moderate-intensity exercise). ? At least 75 minutes of exercise that takes a lot of effort.  Do not use any products that contain nicotine or tobacco, such as cigarettes, e-cigarettes, and chewing tobacco. If you need help quitting, ask your doctor.  Do not drink alcohol if: ? Your doctor tells you not to drink. ? You are pregnant, may be pregnant, or are planning to become pregnant.  If you drink alcohol: ? Limit how much you use to:  0-1 drink a day for women.  0-2 drinks a day for men. ? Be aware of how much alcohol is in your drink. In the [place] one drink equals one 12 oz bottle of beer (355 mL), one 5   oz glass of wine (148 mL), or one 1 oz glass of hard liquor (44 mL).  Do not use drugs.  Manage your stress. Ask your doctor for tips on how to do this. General instructions  Take over-the-counter and prescription medicines only as told by your doctor.  Keep all follow-up visits as told by your doctor. This is important. Where to find more information  American Heart Association: www.heart.org Get help right away if:  You have any signs of a stroke. "BE FAST" is an easy way to remember the  main warning signs: ? B - Balance. Signs are dizziness, sudden trouble walking, or loss of balance. ? E - Eyes. Signs are trouble seeing or a change in how you see. ? F - Face. Signs are sudden weakness or loss of feeling of the face, or the face or eyelid drooping on one side. ? A - Arms. Signs are weakness or loss of feeling in an arm. This happens suddenly and usually on one side of the body. ? S - Speech. Signs are sudden trouble speaking, slurred speech, or trouble understanding what people say. ? T - Time. Time to call emergency services. Write down what time symptoms started.  You have other signs of a stroke, such as: ? A sudden, very bad headache with no known cause. ? Feeling like you may vomit (nausea). ? Vomiting. ? A seizure. These symptoms may be an emergency. Do not wait to see if the symptoms will go away. Get medical help right away. Call your local emergency services (911 in the [place] Do not drive yourself to the hospital. Summary  The carotid arteries are blood vessels on both sides of the neck.  If these arteries get smaller or get blocked, you are more likely to have a stroke or a mini-stroke.  This condition can be treated with lifestyle changes, medicines, surgery, or a blend of these treatments.  Get help right away if you have any signs of a stroke. "BE FAST" is an easy way to remember the main warning signs of stroke. This information is not intended to replace advice given to you by your health care provider. Make sure you discuss any questions you have with your health care provider. Document Revised: 02/28/2019 Document Reviewed: 02/28/2019 Elsevier Patient Education  2020 Else[employer] Inc.  

## 2020-08-17 ENCOUNTER — Encounter (INDEPENDENT_AMBULATORY_CARE_PROVIDER_SITE_OTHER): Payer: Self-pay

## 2020-08-20 ENCOUNTER — Encounter: Payer: Self-pay | Admitting: Vascular Surgery

## 2020-08-21 ENCOUNTER — Ambulatory Visit (INDEPENDENT_AMBULATORY_CARE_PROVIDER_SITE_OTHER): Payer: Medicare Other | Admitting: Vascular Surgery

## 2020-08-29 DIAGNOSIS — I480 Paroxysmal atrial fibrillation: Secondary | ICD-10-CM | POA: Diagnosis not present

## 2020-08-29 DIAGNOSIS — R200 Anesthesia of skin: Secondary | ICD-10-CM | POA: Diagnosis not present

## 2020-08-29 DIAGNOSIS — R7309 Other abnormal glucose: Secondary | ICD-10-CM | POA: Diagnosis not present

## 2020-08-29 DIAGNOSIS — E531 Pyridoxine deficiency: Secondary | ICD-10-CM | POA: Diagnosis not present

## 2020-08-29 DIAGNOSIS — R42 Dizziness and giddiness: Secondary | ICD-10-CM | POA: Diagnosis not present

## 2020-08-29 DIAGNOSIS — E538 Deficiency of other specified B group vitamins: Secondary | ICD-10-CM | POA: Diagnosis not present

## 2020-08-29 DIAGNOSIS — I6521 Occlusion and stenosis of right carotid artery: Secondary | ICD-10-CM | POA: Diagnosis not present

## 2020-08-29 DIAGNOSIS — R202 Paresthesia of skin: Secondary | ICD-10-CM | POA: Diagnosis not present

## 2020-08-29 DIAGNOSIS — R2689 Other abnormalities of gait and mobility: Secondary | ICD-10-CM | POA: Diagnosis not present

## 2020-08-29 DIAGNOSIS — E519 Thiamine deficiency, unspecified: Secondary | ICD-10-CM | POA: Diagnosis not present

## 2020-08-29 DIAGNOSIS — E559 Vitamin D deficiency, unspecified: Secondary | ICD-10-CM | POA: Diagnosis not present

## 2020-08-30 ENCOUNTER — Telehealth: Payer: Self-pay | Admitting: Cardiovascular Disease

## 2020-08-30 DIAGNOSIS — I480 Paroxysmal atrial fibrillation: Secondary | ICD-10-CM | POA: Insufficient documentation

## 2020-08-30 NOTE — Telephone Encounter (Signed)
Pt c/o medication issue:  1. Name of Medication: amiodarone  2. How are you currently taking this medication (dosage and times per day)? 200 mg po q d   3. Are you having a reaction (difficulty breathing--STAT)? Dizzy   4. What is your medication issue? Patient was seen by neuro and was told to call gollan about discontinuing this med as it can cause dizziness issues she is having. Patient advised not to stop until calling cards.   Please advise.

## 2020-08-30 NOTE — Telephone Encounter (Signed)
I spoke with the patient. She advised she saw Dr. Manuella Ghazi with [facility] yesterday. He advised her that she needed to call Dr. Rockey Situ back to discuss possibly coming off of amiodarone due to her dizziness.   I have reviewed her chart. Amiodarone was started 10/05/15. Dizziness started Sept/ Oct 2021. This has not worsened since her last OV with Dr. Rockey Situ on 07/31/20. She is also having tingling in her hands & feet.   I inquired if Dr. Manuella Ghazi had any other recommendations for her. Per the patient, he recommended that she see Dr. Pryor Ochoa with ENT.  I advised her that I will forward a message to Dr. Rockey Situ to review regarding her amiodarone and we will call her back with further recommendations. I have asked her to continue her amiodarone as is until Dr. Rockey Situ advises.  The patient voices understanding and is agreeable.

## 2020-08-31 ENCOUNTER — Other Ambulatory Visit: Payer: Self-pay | Admitting: Family Medicine

## 2020-09-01 NOTE — Telephone Encounter (Signed)
I do not think the amiodarone is contributing to her dizziness.  Not a common side effect But it is certainly her choice if she wants to stop it for a period of several weeks to see if it helps her symptoms Amiodarone is helping keep her in normal sinus rhythm so there is a risk of going into atrial fibrillation When she has a dizzy episode with monitor blood pressure and heart rate for [place] For any abnormal pulse rate we could consider a monitor to make sure she is not having paroxysmal arrhythmia

## 2020-09-03 ENCOUNTER — Other Ambulatory Visit (INDEPENDENT_AMBULATORY_CARE_PROVIDER_SITE_OTHER): Payer: Self-pay | Admitting: Vascular Surgery

## 2020-09-03 NOTE — Telephone Encounter (Signed)
Calling back for status

## 2020-09-05 NOTE — Telephone Encounter (Signed)
Patient calling for status on advice.

## 2020-09-05 NOTE — Telephone Encounter (Signed)
Was able to get back in touch with pt about her dizziness may be the caused of her long hx of taking amiodarone, advised her on Dr. Gwenyth Ober advice of " he does not think the amiodarone is contributing to her dizziness.  Not a common side effect, but it is certainly her choice if she wants to stop it for a period of several weeks to see if it helps her symptoms". Educated pt on how amiodarone is helping keep her in normal sinus rhythm so there is a risk of going into atrial fibrillation if she comes off the medication. Pt reports she is wanting to stay on the medication, reports hx of vertigo in the past, but reports this "feels a little different". Pt has an upcoming appt with ENT. Advised pt when she has a dizzy episode she should monitor her blood pressure and heart rate, for any abnormal pulse rate during these times she should call back and Dr. Rockey Situ would consider a Zio monitor to make sure she is not having paroxysmal arrhythmias. Pt verbalized understanding, otherwise all questions or concerns were address and no additional concerns at this time. Agreeable to plan, will call back for anything further.

## 2020-09-06 ENCOUNTER — Other Ambulatory Visit (INDEPENDENT_AMBULATORY_CARE_PROVIDER_SITE_OTHER): Payer: Self-pay | Admitting: Vascular Surgery

## 2020-09-06 DIAGNOSIS — Z95828 Presence of other vascular implants and grafts: Secondary | ICD-10-CM

## 2020-09-06 DIAGNOSIS — Z9889 Other specified postprocedural states: Secondary | ICD-10-CM

## 2020-09-07 ENCOUNTER — Ambulatory Visit: Payer: Medicare Other | Admitting: Family Medicine

## 2020-09-07 ENCOUNTER — Encounter (INDEPENDENT_AMBULATORY_CARE_PROVIDER_SITE_OTHER): Payer: Self-pay | Admitting: Nurse Practitioner

## 2020-09-07 ENCOUNTER — Ambulatory Visit (INDEPENDENT_AMBULATORY_CARE_PROVIDER_SITE_OTHER): Payer: Medicare Other

## 2020-09-07 ENCOUNTER — Ambulatory Visit (INDEPENDENT_AMBULATORY_CARE_PROVIDER_SITE_OTHER): Payer: Medicare Other | Admitting: Nurse Practitioner

## 2020-09-07 ENCOUNTER — Other Ambulatory Visit: Payer: Self-pay

## 2020-09-07 VITALS — BP 149/76 | HR 69 | Resp 16

## 2020-09-07 DIAGNOSIS — Z9889 Other specified postprocedural states: Secondary | ICD-10-CM

## 2020-09-07 DIAGNOSIS — I6523 Occlusion and stenosis of bilateral carotid arteries: Secondary | ICD-10-CM

## 2020-09-07 DIAGNOSIS — Z95828 Presence of other vascular implants and grafts: Secondary | ICD-10-CM | POA: Diagnosis not present

## 2020-09-07 DIAGNOSIS — R42 Dizziness and giddiness: Secondary | ICD-10-CM

## 2020-09-07 DIAGNOSIS — I10 Essential (primary) hypertension: Secondary | ICD-10-CM

## 2020-09-07 NOTE — Progress Notes (Signed)
Subjective:    Patient ID: Molly Hodges, female    DOB: 28-Dec-1931, 87 y.o.   MRN: [id number] Chief Complaint  Patient presents with   Follow-up    [facility] 3 week post carotid pta stent intervention    The patient is seen for follow up evaluation of carotid stenosis status post right carotid stent on 08/15/2020.  There were no post operative problems or complications related to the surgery.  The patient denies neck or incisional pain.  The patient underwent right carotid angiogram due to discrepancy between ultrasound and CT as well as the presence of dizziness.  It was ultimately found that during the angiogram the patient had 85 to 90% right internal carotid artery stenosis.  The patient notes that she continues to have dizziness at times.  She notes that she tends to have spells mostly during the afternoon or sometimes in the morning.  The patient does have upcoming appointments with neurology as well as ENT for further work-up of dizziness.  The patient denies interval amaurosis fugax. There is no recent history of TIA symptoms or focal motor deficits. There is no prior documented CVA.  The patient denies headache.  The patient is taking enteric-coated aspirin 81 mg daily.  The patient has a history of coronary artery disease, no recent episodes of angina or shortness of breath. The patient denies PAD or claudication symptoms. There is a history of hyperlipidemia which is being treated with a statin.   A noninvasive studies show a patent right internal carotid artery stent with no evidence of restenosis.  The velocities in the left ICA are consistent with a 40 to 59% stenosis.  The bilateral vertebral arteries demonstrate antegrade flow with normal flow hemodynamics in the bilateral subclavian arteries.   Review of Systems  Neurological: Positive for dizziness.  All other systems reviewed and are negative.      Objective:   Physical Exam Vitals reviewed.  HENT:     Head:  Normocephalic.  Neck:     Vascular: No carotid bruit.  Cardiovascular:     Rate and Rhythm: Normal rate and regular rhythm.     Pulses: Normal pulses.  Pulmonary:     Effort: Pulmonary effort is normal.  Skin:    General: Skin is warm and dry.  Neurological:     Mental Status: She is alert and oriented to person, place, and time.     Gait: Gait abnormal.  Psychiatric:        Mood and Affect: Mood normal.        Behavior: Behavior normal.        Thought Content: Thought content normal.        Judgment: Judgment normal.     BP (!) 149/76 (BP Location: Left Arm)    Pulse 69    Resp 16   Past Medical History:  Diagnosis Date   Asthma    Carotid artery disease ([place])    Community acquired pneumonia    COPD (chronic obstructive pulmonary disease) ([place])    Essential hypertension    Glaucoma    Hyperlipidemia    Mitral regurgitation    a. TTE 08/2015: EF 60-65%, normal wall motion, mild MR, mildly dilated left atrium measuring 40 mm, RVSF normal, PASP normal   PAF (paroxysmal atrial fibrillation) ([place]) 09/17/2015   a. s/p DCCV 11/15/2015; CHADS2VASc => 4 (HTN, age x 2, female); c. on Eliquis    Social History   Socioeconomic History   Marital status: Married  Spouse name: Not on file   Number of children: 4   Years of education: Not on file   Highest education level: 12th grade  Occupational History   Occupation: retired  Tobacco Use   Smoking status: Never Smoker   Smokeless tobacco: Never Used  Scientific laboratory technician Use: Never used  Substance and Sexual Activity   Alcohol use: No   Drug use: Never   Sexual activity: Not on file  Other Topics Concern   Not on file  Social History Narrative   Lives at home with Husband. Active and Independent at baseline.   Social Determinants of Health   Financial Resource Strain: Low Risk    Difficulty of Paying Living Expenses: Not hard at all  Food Insecurity: No Food Insecurity   Worried About  Charity fundraiser in the Last Year: Never true   [place] in the Last Year: Never true  Transportation Needs: No Transportation Needs   Lack of Transportation (Medical): No   Lack of Transportation (Non-Medical): No  Physical Activity: Inactive   Days of Exercise per Week: 0 days   Minutes of Exercise per Session: 0 min  Stress: No Stress Concern Present   Feeling of Stress : Not at all  Social Connections: Moderately Integrated   Frequency of Communication with Friends and Family: More than three times a week   Frequency of Social Gatherings with Friends and Family: More than three times a week   Attends Religious Services: More than 4 times per year   Active Member of [employer] or Organizations: No   Attends Archivist Meetings: Never   Marital Status: Married  Human resources officer Violence: Not At Risk   Fear of Current or Ex-Partner: No   Emotionally Abused: No   Physically Abused: No   Sexually Abused: No    Past Surgical History:  Procedure Laterality Date   ABDOMINAL HYSTERECTOMY  1987   due to heavy bleeding   APPENDECTOMY     CAROTID PTA/STENT INTERVENTION Right 08/15/2020   Procedure: CAROTID PTA/STENT INTERVENTION;  Surgeon: Algernon Huxley, MD;  Location: [place] [facility];  Service: Cardiovascular;  Laterality: Right;   CATARACT EXTRACTION     ELECTROPHYSIOLOGIC STUDY N/A 11/15/2015   Procedure: CARDIOVERSION;  Surgeon: Minna Merritts, MD;  Location: [facility] ORS;  Service: Cardiovascular;  Laterality: N/A;    Family History  Problem Relation Age of Onset   CAD Mother    CAD Father    Cancer Son 15       lung cancer    Allergies  Allergen Reactions   Shellfish Allergy Anaphylaxis    Other reaction(s): Hallucination   Azithromycin Other (See Comments)    Extreme burning sensation at IV site Other reaction(s): Unknown   Tamiflu [Oseltamivir Phosphate] Other (See Comments)    Reaction:  Hallucinations  Reaction:   Hallucinations    Albuterol Palpitations    Heart racing.     CBC Latest Ref Rng & Units 08/16/2020 05/14/2020 02/14/2020  WBC 4.0 - 10.5 K/uL 13.5(H) 12.6(H) 9.9  Hemoglobin 12.0 - 15.0 g/dL 10.5(L) 12.2 12.8  Hematocrit 36.0 - 46.0 % 32.7(L) 38.0 39.5  Platelets 150 - 400 K/uL 192 297 243      CMP     Component Value Date/Time   NA 138 08/16/2020 0629   NA 139 11/30/2019 1022   NA 143 05/10/2012 1100   K 4.0 08/16/2020 0629   K 3.7 05/10/2012 1100  CL 108 08/16/2020 0629   CL 104 05/10/2012 1100   CO2 23 08/16/2020 0629   CO2 29 05/10/2012 1100   GLUCOSE 170 (H) 08/16/2020 0629   GLUCOSE 106 (H) 05/10/2012 1100   BUN 18 08/16/2020 0629   BUN 16 11/30/2019 1022   BUN 17 05/10/2012 1100   CREATININE 0.76 08/16/2020 0629   CREATININE 0.99 05/10/2012 1100   CALCIUM 8.2 (L) 08/16/2020 0629   CALCIUM 10.2 (H) 05/10/2012 1100   PROT 6.9 07/03/2020 0857   PROT 7.7 05/10/2012 1100   ALBUMIN 4.4 07/03/2020 0857   ALBUMIN 3.9 05/10/2012 1100   AST 18 07/03/2020 0857   AST 27 05/10/2012 1100   ALT 23 07/03/2020 0857   ALT 27 05/10/2012 1100   ALKPHOS 103 07/03/2020 0857   ALKPHOS 86 05/10/2012 1100   BILITOT 0.3 07/03/2020 0857   BILITOT 0.5 05/10/2012 1100   GFRNONAA >60 08/16/2020 0629   GFRNONAA 55 (L) 05/10/2012 1100   GFRAA >60 05/14/2020 0918   GFRAA >60 05/10/2012 1100     No results found.     Assessment & Plan:   1. Bilateral carotid artery stenosis Recommend:  The patient is s/p successful carotid stent placement  Duplex ultrasound preoperatively shows 40 to 59% contralateral stenosis.  Continue antiplatelet therapy as prescribed Continue management of CAD, HTN and Hyperlipidemia Healthy heart diet,  encouraged exercise at least 4 times per week  Follow up in 3 months with duplex ultrasound and physical exam based on the patient's carotid surgery and 50% stenosis of the left carotid artery    2. Primary hypertension Continue antihypertensive  medications as already ordered, these medications have been reviewed and there are no changes at this time.   3. Dizziness Patient still continues to have dizziness.  She is currently working with PCP for further work-up as well as has a referral to ENT.   Current Outpatient Medications on File Prior to Visit  Medication Sig Dispense Refill   albuterol (PROVENTIL) (2.5 MG/3ML) 0.083% nebulizer solution TAKE 3 MLS (2.5 MG TOTAL) BY NEBULIZATION EVERY 4 HOURS AS NEEDED FORUP TO 12 DOSES FOR WHEEZING OR SHORTNESSOF BREATH 75 mL 5   albuterol (VENTOLIN HFA) 108 (90 Base) MCG/ACT inhaler Inhale 2 puffs into the lungs every 6 (six) hours as needed for wheezing or shortness of breath. 8 g 2   amiodarone (PACERONE) 200 MG tablet TAKE ONE TABLET EVERY DAY (Patient taking differently: Take 200 mg by mouth daily.) 90 tablet 0   apixaban (ELIQUIS) 5 MG TABS tablet Take 1 tablet (5 mg total) by mouth 2 (two) times daily. 180 tablet 3   aspirin EC 81 MG EC tablet Take 1 tablet (81 mg total) by mouth daily at 6 (six) AM. Swallow whole. 30 tablet 11   budesonide-formoterol (SYMBICORT) 160-4.5 MCG/ACT inhaler Inhale 2 puffs into the lungs 2 (two) times daily. 1 Inhaler 12   Calcium Carbonate-Vitamin D 600-400 MG-UNIT tablet Take 1 tablet by mouth daily.     cholecalciferol (VITAMIN D) 1000 UNITS tablet Take 1,000 Units by mouth daily.     clopidogrel (PLAVIX) 75 MG tablet TAKE 1 TABLET BY MOUTH DAILY AT 6AM AS DIRECTED 30 tablet 2   diltiazem (CARDIZEM CD) 180 MG 24 hr capsule TAKE 1 CAPSULE EVERY DAY (Patient taking differently: Take 180 mg by mouth daily.) 30 capsule 11   dorzolamide (TRUSOPT) 2 % ophthalmic solution Place 1 drop into both eyes 2 (two) times daily.      ezetimibe (ZETIA)  10 MG tablet Take 1 tablet (10 mg total) by mouth daily. 90 tablet 3   gabapentin (NEURONTIN) 300 MG capsule TAKE 1 CAPSULE EVERY MORNING AND TAKE 2 CAPSULES AT BEDTIME (Patient taking differently: Take 300-600 mg  by mouth 2 (two) times daily.) 90 capsule 4   hydrochlorothiazide (MICROZIDE) 12.5 MG capsule TAKE 1 CAPSULE BY MOUTH EVERY DAY (Patient taking differently: Take 12.5 mg by mouth daily.) 90 capsule 1   latanoprost (XALATAN) 0.005 % ophthalmic solution Place 1 drop into both eyes at bedtime.      levalbuterol (XOPENEX HFA) 45 MCG/ACT inhaler Inhale 2 puffs into the lungs every 4 (four) hours as needed for wheezing. 1 Inhaler 2   magnesium oxide (MAG-OX) 400 MG tablet Take 400 mg by mouth daily.     Omega-3 Fatty Acids (FISH OIL BURP-LESS) 1000 MG CAPS Take 1,000 mg by mouth daily.      rosuvastatin (CRESTOR) 20 MG tablet Take 1 tablet (20 mg total) by mouth daily. 90 tablet 3   vitamin C (ASCORBIC ACID) 500 MG tablet Take 1,000 mg by mouth daily.     vitamin E 100 UNIT capsule Take 200 Units by mouth daily.     meclizine (ANTIVERT) 12.5 MG tablet Take 1 tablet (12.5 mg total) by mouth 2 (two) times daily as needed for dizziness. (Patient not taking: No sig reported) 60 tablet 0   potassium chloride (KLOR-CON) 10 MEQ tablet Take 1 tablet (10 mEq total) by mouth daily. 90 tablet 1   No current facility-administered medications on file prior to visit.    There are no Patient Instructions on file for this visit. No follow-ups on file.   Kris Hartmann, NP

## 2020-09-11 ENCOUNTER — Other Ambulatory Visit: Payer: Self-pay

## 2020-09-11 ENCOUNTER — Other Ambulatory Visit: Payer: Self-pay | Admitting: Family Medicine

## 2020-09-11 ENCOUNTER — Ambulatory Visit: Payer: Medicare Other | Attending: Neurology

## 2020-09-11 ENCOUNTER — Ambulatory Visit: Payer: Medicare Other | Admitting: Family Medicine

## 2020-09-11 ENCOUNTER — Other Ambulatory Visit: Payer: Self-pay | Admitting: Cardiovascular Disease

## 2020-09-11 VITALS — BP 131/54 | HR 75

## 2020-09-11 DIAGNOSIS — M6281 Muscle weakness (generalized): Secondary | ICD-10-CM | POA: Insufficient documentation

## 2020-09-11 DIAGNOSIS — R2689 Other abnormalities of gait and mobility: Secondary | ICD-10-CM | POA: Insufficient documentation

## 2020-09-11 DIAGNOSIS — R2681 Unsteadiness on feet: Secondary | ICD-10-CM | POA: Diagnosis not present

## 2020-09-11 NOTE — Therapy (Signed)
[facility] MAIN [facility] SERVICES [address] [place], [place], [address] Phone: [phone]   Fax:  [phone]  Physical Therapy Evaluation  Patient Details  Name: Molly Hodges MRN: [id number] Date of Birth: [birth year] Referring Provider (PT): Lonell Face, MD   Encounter Date: 09/11/2020   PT End of Session - 09/11/20 1616    Visit Number 1    Number of Visits 17    Date for PT Re-Evaluation 11/06/20    PT Start Time 1430    PT Stop Time 1538    PT Time Calculation (min) 68 min    Equipment Utilized During Treatment Gait belt    Activity Tolerance Patient tolerated treatment well    Behavior During Therapy [facility] for tasks assessed/performed           Past Medical History:  Diagnosis Date  . Asthma   . Carotid artery disease ([facility])   . Community acquired pneumonia   . COPD (chronic obstructive pulmonary disease) ([facility])   . Essential hypertension   . Glaucoma   . Hyperlipidemia   . Mitral regurgitation    a. TTE 08/2015: EF 60-65%, normal wall motion, mild MR, mildly dilated left atrium measuring 40 mm, RVSF normal, PASP normal  . PAF (paroxysmal atrial fibrillation) ([facility]) 09/17/2015   a. s/p DCCV 11/15/2015; CHADS2VASc => 4 (HTN, age x 2, female); c. on Eliquis    Past Surgical History:  Procedure Laterality Date  . ABDOMINAL HYSTERECTOMY  1987   due to heavy bleeding  . APPENDECTOMY    . CAROTID PTA/STENT INTERVENTION Right 08/15/2020   Procedure: CAROTID PTA/STENT INTERVENTION;  Surgeon: Annice Needy, MD;  Location: [facility] INVASIVE CV LAB;  Service: Cardiovascular;  Laterality: Right;  . CATARACT EXTRACTION    . ELECTROPHYSIOLOGIC STUDY N/A 11/15/2015   Procedure: CARDIOVERSION;  Surgeon: Antonieta Iba, MD;  Location: [facility] ORS;  Service: Cardiovascular;  Laterality: N/A;    Vitals:   09/11/20 1447  BP: (!) 131/54  Pulse: 75      Subjective Assessment - 09/11/20 1436    Subjective Pt reports she was referred due  to neuropathy in her feet and has reported recent episodes of dizziness. Pt reports 3 recent "dizzy spells" but that the past couple days have been "good days."  Pt reports her husband was assisting her more at home until he had a fall a few months ago and might need a hip replacement. Pt denies that she has had any falls in past 6 months. Pt states she has appointment tomorrow at [facility] ENT.    Pertinent History Pt is an 87 y/o female presenting to therapy s/p R Carotid Artery stent 08/15/2020 with complaints of dizziness and decreased balance. Pt has neuropathy and reports she is taking gabapentin. Pt reports her sx worsened over the summer and that in October she started having dizzy spells. She went to her cardiologist and they found blocked carotid and had stent placed on December 29th. Pt says she's continued to have dizzy spells since the stent has placed. Pt denies HA and denies the room ever appearing like it's spinning. Pt reports nausea when dizziness "gets real bad" and that she "tries to move one way" but her body "goes the other way."  Pt does not use an AD at home but holds onto furniture or the wall to improve balance when walking. Pt reports TV "flashing" bothers her but says that she has "always been like that."   Other  PMH includes presyncopal episodes, asthma, atrial fibrillation, COPD, glaucoma, hypertension, neuropathy carotid duplex, high-grade right carotid artery stenosis and more mild left carotid artery stenosis. Per chart: "She had a CT scan which was officially read as 69 to 65% bilateral carotid artery stenosis although it appeared worse than that to my interpretation. With the disparate findings, catheter-based angiogram is indicated. The patient has a relatively high lesion and already has to undergo the angiogram and carotid artery stenting was felt to be preferred to endarterectomy for that reason."    Limitations Walking;House hold activities;Standing    How long can you sit  comfortably? not limited    Currently in Pain? No/denies              [facility] PT Assessment - 09/11/20 0001      Assessment   Medical Diagnosis Dizziness    Referring Provider (PT) Vladimir Crofts, MD    Onset Date/Surgical Date 08/15/20    Hand Dominance Right    Next MD Visit 09/12/2020   Pt not currently scheduled for with cardiologist but seeing ENT tomorrow   Prior Therapy No      Precautions   Precautions Fall      Restrictions   Weight Bearing Restrictions No      Balance Screen   Has the patient fallen in the past 6 months No    Has the patient had a decrease in activity level because of a fear of falling?  Yes      [place] residence    Living Arrangements Spouse/significant other    Available Help at Discharge Family    Type of [place] to enter    Entrance Stairs-Number of Steps [place] One level    [place] seat;Grab bars - tub/shower;Grab bars - toilet;Cane - single point;Walker - 4 wheels;Walker - 2 wheels;Wheelchair - manual      Prior Function   Level of Independence Independent    Vocation Retired    Research officer, political party   Overall Cognitive Status Within Functional Limits for tasks assessed      Standardized Balance Assessment   Standardized Balance Assessment Berg Balance Test;Dynamic Gait Index      Berg Balance Test   Sit to Stand Able to stand without using hands and stabilize independently    Standing Unsupported Able to stand safely 2 minutes    Sitting with Back Unsupported but Feet Supported on Floor or Stool Able to sit safely and securely 2 minutes    Stand to Sit Sits safely with minimal use of hands    Transfers Able to transfer safely, minor use of hands    Standing Unsupported with Eyes Closed Able to stand 10 seconds safely    Standing Unsupported with Feet Together Able to place feet together independently  and stand for 1 minute with supervision    From Standing, Reach Forward with Outstretched Arm Can reach confidently >25 cm (10")    From Standing Position, Pick up Object from Floor Able to pick up shoe safely and easily    From Standing Position, Turn to Look Behind Over each Shoulder Looks behind from both sides and weight shifts well    Turn 360 Degrees Able to turn 360 degrees safely in 4 seconds or less    Standing Unsupported, Alternately Place Feet on Step/Stool  Able to stand independently and safely and complete 8 steps in 20 seconds    Standing Unsupported, One Foot in Front Able to plae foot ahead of the other independently and hold 30 seconds    Standing on One Leg Able to lift leg independently and hold 5-10 seconds    Total Score 53      Dynamic Gait Index   Level Surface Normal    Change in Gait Speed Normal    Gait with Horizontal Head Turns Moderate Impairment    Gait with Vertical Head Turns Mild Impairment    Gait and Pivot Turn Mild Impairment    Step Over Obstacle Normal    Step Around Obstacles Normal    Steps Mild Impairment    Total Score 19            Objective measurements completed on examination: See above findings.        PT Education - 09/11/20 1615    Education Details Pt educated on exam findings and POC    Person(s) Educated Patient    Methods Explanation    Comprehension Verbalized understanding            PT Short Term Goals - 09/11/20 1633      PT SHORT TERM GOAL #1   Title Patient will be independent in home exercise program to improve strength/mobility for better functional independence with ADLs.    Baseline 09/11/2020 To issue HEP next session    Time 4    Period Weeks    Status New    Target Date 10/09/20         Testing:  Sensation in tact in B UE/BLE  Coordination in tact in UE/LE  Smooth pursuits: corrective saccades  Saccades: normal   Posture: slight forward head posture/rounded shoulders   ABC Scale: 30.6%   DHI: 40 indicating moderate handicap BERG: 53/56 DGI: 19. <19/24 predictive of falls. Pt greatest difficulty with horizontal/vertical head turns. FOTO: 47 (risk-adjusted 51)  5xSTS 15.38 without UE support Modified CTSIB: smooth surfac EO/EC both normal, compliant surface EO normal EC with increased postural sway requiring CGA to prevent LOB posteriorly.  MMT: Hip flexors 4+/5 L, 4/5 R Hip abduction/adductoin both 5/5 B Knee flexion/extension both 5/5 B  Plantarflexion 5/5 L, 5/5 R Dorsiflexion 4+/5 L, 5/5 R   Stairs: ascending/descending utilizes recepircoal pattern both ways and unilateral UE support  Assessment: Pt is a pleasant 87 y/o female presenting to therapy s/p R Carotid Artery stent placed 08/15/2020 referred for dizziness and balance impairment. Upon examination pt presents with abnormal smooth pursuits, decreased strength and power of B LE musculature (see MMT, 5xSTS 15.38 sec), and impaired balance (DGI 19/24). Pt also with decreased ABC scale score and DHI score, both indicating decreased balance confidence and indicating moderate handicap due to balance impairment. Pt with greatest difficulty standing with EC on compliant surface and ambulating with horizontal and vertical head turns. Pt will benefit from further skilled therapy to increase B LE strength and to increase both static and dynamic balance in order to decrease fall risk.   PT Long Term Goals - 09/11/20 1635      PT LONG TERM GOAL #1   Title Patient (> 80 years old) will complete five times sit to stand test in < 15 seconds indicating an increased LE strength and improved balance.    Baseline 09/11/2020 15.38 without UE support    Time 8    Period Weeks    Status New  Target Date 11/06/20      PT LONG TERM GOAL #2   Title Patient will increase FOTO score to equal to or greater than 53  to demonstrate statistically significant improvement in mobility and quality of life.    Baseline 09/11/2020 FOTO 45  (risk-adjusted 51)    Time 8    Period Weeks    Status New    Target Date 11/06/20      PT LONG TERM GOAL #3   Title Patient will increase Berg Balance score by 3 points to demonstrate decreased fall risk during functional activities.    Baseline 09/11/2020 BERG 53/56    Time 8    Period Weeks    Status New    Target Date 11/06/20      PT LONG TERM GOAL #4   Title Patient will reduce dizziness handicap inventory score to <20, for less dizziness with ADLs and increased safety with home and work tasks.    Baseline 09/11/2020 Score 40 indicating moderate handicap    Time 8    Period Weeks    Status New    Target Date 11/06/20      PT LONG TERM GOAL #5   Title Patient will increase ABC scale score >80% to demonstrate better functional mobility and better confidence with ADLs.    Baseline 09/11/2020 ABC score 30.6%    Time 8    Period Weeks    Status New    Target Date 11/06/20           Patient will benefit from skilled therapeutic intervention in order to improve the following deficits and impairments:  Abnormal gait,Decreased balance,Difficulty walking,Decreased mobility,Decreased endurance,Decreased activity tolerance,Decreased coordination,Decreased strength,Impaired flexibility,Postural dysfunction,Improper body mechanics,Dizziness,Decreased range of motion,Hypomobility,Impaired sensation  Visit Diagnosis: Unsteadiness on feet  Other abnormalities of gait and mobility  Muscle weakness (generalized)     Problem List Patient Active Problem List   Diagnosis Date Noted  . Paroxysmal atrial fibrillation ([place]) 08/30/2020  . Carotid stenosis, right 08/15/2020  . Carotid stenosis 07/17/2020  . Meningioma ([place]) 07/04/2020  . Cyst of thyroid 07/03/2020  . Hypothyroidism 07/03/2020  . Long-term use of high-risk medication 11/11/2016  . COPD with acute bronchitis ([place]) 11/11/2016  . Lower extremity edema 05/29/2016  . Elevated troponin   . Hypoxia   . Encounter for  anticoagulation discussion and counseling   . Premature ventricular contraction 03/26/2015  . Vitamin D deficiency 03/26/2015  . Peripheral neuropathy 03/26/2015  . Hypertension 03/26/2015  . Asthma 03/26/2015   Ricard Dillon PT, DPT  Zollie Pee 09/11/2020, 5:03 PM  [place] MAIN [facility] SERVICES [address] [place], [place], [address] Phone: [phone]   Fax:  [phone]  Name: Molly Hodges MRN: [id number] Date of Birth: 1932-01-02

## 2020-09-12 DIAGNOSIS — R42 Dizziness and giddiness: Secondary | ICD-10-CM | POA: Diagnosis not present

## 2020-09-12 DIAGNOSIS — H6122 Impacted cerumen, left ear: Secondary | ICD-10-CM | POA: Diagnosis not present

## 2020-09-17 ENCOUNTER — Ambulatory Visit (INDEPENDENT_AMBULATORY_CARE_PROVIDER_SITE_OTHER): Payer: Medicare Other | Admitting: Family Medicine

## 2020-09-17 ENCOUNTER — Other Ambulatory Visit: Payer: Self-pay

## 2020-09-17 ENCOUNTER — Encounter: Payer: Self-pay | Admitting: Family Medicine

## 2020-09-17 VITALS — BP 145/55 | HR 78 | Temp 97.8°F | Resp 16 | Ht 65.0 in | Wt 134.0 lb

## 2020-09-17 DIAGNOSIS — I10 Essential (primary) hypertension: Secondary | ICD-10-CM | POA: Diagnosis not present

## 2020-09-17 DIAGNOSIS — I6523 Occlusion and stenosis of bilateral carotid arteries: Secondary | ICD-10-CM | POA: Diagnosis not present

## 2020-09-17 DIAGNOSIS — E785 Hyperlipidemia, unspecified: Secondary | ICD-10-CM | POA: Diagnosis not present

## 2020-09-17 DIAGNOSIS — D509 Iron deficiency anemia, unspecified: Secondary | ICD-10-CM

## 2020-09-17 NOTE — Progress Notes (Signed)
Established patient visit   Patient: Molly Hodges   DOB: April 03, 1932   87 y.o. Female  MRN: [id number] Visit Date: 09/17/2020  Today's healthcare provider: Lelon Huh, MD   Chief Complaint  Patient presents with  . Hyperlipidemia   Subjective    HPI  Lipid/Cholesterol, Follow-up  Last lipid panel Other pertinent labs  Lab Results  Component Value Date   CHOL 228 (H) 07/03/2020   HDL 59 07/03/2020   LDLCALC 156 (H) 07/03/2020   TRIG 77 07/03/2020   CHOLHDL 3.9 07/03/2020   Lab Results  Component Value Date   ALT 23 07/03/2020   AST 18 07/03/2020   PLT 192 08/16/2020   TSH 3.590 07/03/2020     She was last seen for this 2 months ago.  Management since that visit includes starting rosuvastatin 10mg  once a day.  She reports good compliance with treatment. She is not having side effects.   Symptoms: No chest pain No chest pressure/discomfort  No dyspnea No lower extremity edema  No numbness or tingling of extremity No orthopnea  No palpitations No paroxysmal nocturnal dyspnea  No speech difficulty No syncope   Current diet: well balanced Current exercise: no regular exercise  The ASCVD Risk score (Bear [place]., et al., 2013) failed to calculate for the following reasons:   The 2013 ASCVD risk score is only valid for ages 29 to 33  She has also been having some dizziness for several months and was referred to neuro-surgery due to suspected meningioma on brain MRI. However this was not felt to be related to the dizziness. She also had stenting of nearly occluded right carotid artery in December which did not help her dizziness. She did stop her 12.5mg  hctz a few days ago and the suggestion of cardiologist and states the dizziness has improved since then. She does monitor her BP at home every day and reports it has remained in the 120s-130s even after stopping hctz.     Medications: Outpatient Medications Prior to Visit  Medication Sig  . albuterol  (PROVENTIL) (2.5 MG/3ML) 0.083% nebulizer solution TAKE 3 MLS (2.5 MG TOTAL) BY NEBULIZATION EVERY 4 HOURS AS NEEDED FORUP TO 12 DOSES FOR WHEEZING OR SHORTNESSOF BREATH  . albuterol (VENTOLIN HFA) 108 (90 Base) MCG/ACT inhaler Inhale 2 puffs into the lungs every 6 (six) hours as needed for wheezing or shortness of breath.  Marland Kitchen amiodarone (PACERONE) 200 MG tablet TAKE ONE TABLET EVERY DAY  . apixaban (ELIQUIS) 5 MG TABS tablet Take 1 tablet (5 mg total) by mouth 2 (two) times daily.  Marland Kitchen aspirin EC 81 MG EC tablet Take 1 tablet (81 mg total) by mouth daily at 6 (six) AM. Swallow whole.  . budesonide-formoterol (SYMBICORT) 160-4.5 MCG/ACT inhaler Inhale 2 puffs into the lungs 2 (two) times daily.  . Calcium Carbonate-Vitamin D 600-400 MG-UNIT tablet Take 1 tablet by mouth daily.  . cholecalciferol (VITAMIN D) 1000 UNITS tablet Take 1,000 Units by mouth daily.  . clopidogrel (PLAVIX) 75 MG tablet TAKE 1 TABLET BY MOUTH DAILY AT 6AM AS DIRECTED  . diltiazem (CARDIZEM CD) 180 MG 24 hr capsule TAKE 1 CAPSULE EVERY DAY (Patient taking differently: Take 180 mg by mouth daily.)  . dorzolamide (TRUSOPT) 2 % ophthalmic solution Place 1 drop into both eyes 2 (two) times daily.   Marland Kitchen ezetimibe (ZETIA) 10 MG tablet Take 1 tablet (10 mg total) by mouth daily.  Marland Kitchen gabapentin (NEURONTIN) 300 MG capsule TAKE 1 CAPSULE  EVERY MORNING AND TAKE 2 CAPSULES AT BEDTIME (Patient taking differently: Take 300-600 mg by mouth 2 (two) times daily.)  . hydrochlorothiazide (MICROZIDE) 12.5 MG capsule TAKE 1 CAPSULE BY MOUTH EVERY DAY  . latanoprost (XALATAN) 0.005 % ophthalmic solution Place 1 drop into both eyes at bedtime.   . levalbuterol (XOPENEX HFA) 45 MCG/ACT inhaler Inhale 2 puffs into the lungs every 4 (four) hours as needed for wheezing.  . magnesium oxide (MAG-OX) 400 MG tablet Take 400 mg by mouth daily.  . meclizine (ANTIVERT) 12.5 MG tablet Take 1 tablet (12.5 mg total) by mouth 2 (two) times daily as needed for dizziness.   . Omega-3 Fatty Acids (FISH OIL BURP-LESS) 1000 MG CAPS Take 1,000 mg by mouth daily.   . rosuvastatin (CRESTOR) 20 MG tablet Take 1 tablet (20 mg total) by mouth daily.  . vitamin C (ASCORBIC ACID) 500 MG tablet Take 1,000 mg by mouth daily.  . vitamin E 100 UNIT capsule Take 200 Units by mouth daily.  . potassium chloride (KLOR-CON) 10 MEQ tablet Take 1 tablet (10 mEq total) by mouth daily.   No facility-administered medications prior to visit.    Review of Systems  Constitutional: Negative for appetite change, chills, fatigue and fever.  Respiratory: Negative for chest tightness and shortness of breath.   Cardiovascular: Negative for chest pain and palpitations.  Gastrointestinal: Negative for abdominal pain, nausea and vomiting.  Neurological: Negative for dizziness and weakness.       Objective    BP (!) 145/55   Pulse 78   Temp 97.8 F (36.6 C)   Resp 16   Ht 5\' 5"  (1.651 m)   Wt 134 lb (60.8 kg)   BMI 22.30 kg/m     Physical Exam   General: Appearance:    Well developed, well nourished female in no acute distress  Eyes:    PERRL, conjunctiva/corneas clear, EOM's intact       Lungs:     Clear to auscultation bilaterally, respirations unlabored  Heart:    Normal heart rate. Irregularly irregular rhythm. No murmurs, rubs, or gallops.   MS:   All extremities are intact.   Neurologic:   Awake, alert, oriented x 3. No apparent focal neurological           defect.         Assessment & Plan     1. Bilateral carotid artery stenosis  - Lipid panel Doing well s/p right carotid stenting in December.   2. Hyperlipidemia, unspecified hyperlipidemia type She is tolerating rosuvastatin well with no adverse effects.  Continue ezetimibe.  - Comprehensive metabolic panel - Lipid panel - CBC  3. Hypochromic microcytic anemia  - Iron, TIBC and Ferritin Panel  4. Primary hypertension Home BP well controlled. Dizziness does seem to have improved the last couple of days  since stopping hctz. She is to let me know if she start seeing SBPs over 160. consider stopping potassium after reviewing labs.         The entirety of the information documented in the History of Present Illness, Review of Systems and Physical Exam were personally obtained by me. Portions of this information were initially documented by the CMA and reviewed by me for thoroughness and accuracy.      Lelon Huh, MD  [facility] [phone] (phone) [phone] (fax)  [place]

## 2020-09-17 NOTE — Patient Instructions (Addendum)
.   You can stay off of the hctz so long as your home blood pressures remain below 160 for the short term.

## 2020-09-18 ENCOUNTER — Ambulatory Visit: Payer: Medicare Other | Attending: Neurology

## 2020-09-18 VITALS — BP 156/40 | HR 68

## 2020-09-18 DIAGNOSIS — M6281 Muscle weakness (generalized): Secondary | ICD-10-CM | POA: Diagnosis not present

## 2020-09-18 DIAGNOSIS — R2689 Other abnormalities of gait and mobility: Secondary | ICD-10-CM | POA: Insufficient documentation

## 2020-09-18 DIAGNOSIS — R2681 Unsteadiness on feet: Secondary | ICD-10-CM | POA: Insufficient documentation

## 2020-09-18 LAB — COMPREHENSIVE METABOLIC PANEL
ALT: 44 IU/L — ABNORMAL HIGH (ref 0–32)
AST: 36 IU/L (ref 0–40)
Albumin/Globulin Ratio: 1.8 (ref 1.2–2.2)
Albumin: 4.3 g/dL (ref 3.6–4.6)
Alkaline Phosphatase: 90 IU/L (ref 44–121)
BUN/Creatinine Ratio: 17 (ref 12–28)
BUN: 17 mg/dL (ref 8–27)
Bilirubin Total: 0.3 mg/dL (ref 0.0–1.2)
CO2: 24 mmol/L (ref 20–29)
Calcium: 9 mg/dL (ref 8.7–10.3)
Chloride: 106 mmol/L (ref 96–106)
Creatinine, Ser: 1.03 mg/dL — ABNORMAL HIGH (ref 0.57–1.00)
GFR calc Af Amer: 57 mL/min/{1.73_m2} — ABNORMAL LOW (ref >59)
GFR calc non Af Amer: 49 mL/min/{1.73_m2} — ABNORMAL LOW (ref >59)
Globulin, Total: 2.4 g/dL (ref 1.5–4.5)
Glucose: 89 mg/dL (ref 65–99)
Potassium: 4.2 mmol/L (ref 3.5–5.2)
Sodium: 143 mmol/L (ref 134–144)
Total Protein: 6.7 g/dL (ref 6.0–8.5)

## 2020-09-18 LAB — IRON,TIBC AND FERRITIN PANEL
Ferritin: 30 ng/mL (ref 15–150)
Iron Saturation: 7 % — CL (ref 15–55)
Iron: 24 ug/dL — ABNORMAL LOW (ref 27–139)
Total Iron Binding Capacity: 335 ug/dL (ref 250–450)
UIBC: 311 ug/dL (ref 118–369)

## 2020-09-18 LAB — CBC
Hematocrit: 38 % (ref 34.0–46.6)
Hemoglobin: 11.6 g/dL (ref 11.1–15.9)
MCH: 23.5 pg — ABNORMAL LOW (ref 26.6–33.0)
MCHC: 30.5 g/dL — ABNORMAL LOW (ref 31.5–35.7)
MCV: 77 fL — ABNORMAL LOW (ref 79–97)
Platelets: 265 10*3/uL (ref 150–450)
RBC: 4.93 x10E6/uL (ref 3.77–5.28)
RDW: 15.3 % (ref 11.7–15.4)
WBC: 10.6 10*3/uL (ref 3.4–10.8)

## 2020-09-18 LAB — LIPID PANEL
Chol/HDL Ratio: 1.8 ratio (ref 0.0–4.4)
Cholesterol, Total: 119 mg/dL (ref 100–199)
HDL: 66 mg/dL (ref >39)
LDL Chol Calc (NIH): 40 mg/dL (ref 0–99)
Triglycerides: 58 mg/dL (ref 0–149)
VLDL Cholesterol Cal: 13 mg/dL (ref 5–40)

## 2020-09-18 NOTE — Therapy (Addendum)
[place] MAIN [facility] SERVICES [address] [place], [place], [address] Phone: [phone]   Fax:  [phone]  Physical Therapy Treatment  Patient Details  Name: Molly Hodges MRN: [id number] Date of Birth: [birth year] Referring Provider (PT): Vladimir Crofts, MD   Encounter Date: 09/18/2020   PT End of Session - 09/18/20 1607    Visit Number 2    Number of Visits 17    Date for PT Re-Evaluation 11/06/20    PT Start Time [id number]    PT Stop Time 1434    PT Time Calculation (min) 46 min    Equipment Utilized During Treatment Gait belt    Activity Tolerance Patient tolerated treatment well    Behavior During Therapy [facility] for tasks assessed/performed           Past Medical History:  Diagnosis Date  . Asthma   . Carotid artery disease ([place])   . Community acquired pneumonia   . COPD (chronic obstructive pulmonary disease) ([place])   . Essential hypertension   . Glaucoma   . Hyperlipidemia   . Mitral regurgitation    a. TTE 08/2015: EF 60-65%, normal wall motion, mild MR, mildly dilated left atrium measuring 40 mm, RVSF normal, PASP normal  . PAF (paroxysmal atrial fibrillation) ([place]) 09/17/2015   a. s/p DCCV 11/15/2015; CHADS2VASc => 4 (HTN, age x 2, female); c. on Eliquis    Past Surgical History:  Procedure Laterality Date  . ABDOMINAL HYSTERECTOMY  1987   due to heavy bleeding  . APPENDECTOMY    . CAROTID PTA/STENT INTERVENTION Right 08/15/2020   Procedure: CAROTID PTA/STENT INTERVENTION;  Surgeon: Algernon Huxley, MD;  Location: [place] CV LAB;  Service: Cardiovascular;  Laterality: Right;  . CATARACT EXTRACTION    . ELECTROPHYSIOLOGIC STUDY N/A 11/15/2015   Procedure: CARDIOVERSION;  Surgeon: Minna Merritts, MD;  Location: [facility] ORS;  Service: Cardiovascular;  Laterality: N/A;    Vitals:   09/18/20 1624  BP: (!) 156/40  Pulse: 68     Subjective Assessment - 09/18/20 1602    Subjective Pt reports she's going to have to  cancel a few of her appointments due to her husband having health problems. Pt husband going for CT scan. Pt says she has had "five good days" since her ENT appointment and that they performed "inner ear" testing. She says ENT could not find "anything wrong" and pt says they suspect her sx might have been due to a medication. Pt reports her physician discontinued medication hydrochlorothiazide and that she has had "five good days" and no problems since. Pt reports she was told to monitor her BP and for swelling.    Pertinent History Pt is an 87 y/o female presenting to therapy s/p R Carotid Artery stent 08/15/2020 with complaints of dizziness and decreased balance. Pt has neuropathy and reports she is taking gabapentin. Pt reports her sx worsened over the summer and that in October she started having dizzy spells. She went to her cardiologist and they found blocked carotid and had stent placed on December 29th. Pt says she's continued to have dizzy spells since the stent has placed. Pt denies HA and denies the room ever appearing like it's spinning. Pt reports nausea when dizziness "gets real bad" and that she "tries to move one way" but her body "goes the other way."  Pt does not use an AD at home but holds onto furniture or the wall to improve balance when walking. Pt reports  TV "flashing" bothers her but says that she has "always been like that."   Other PMH includes presyncopal episodes, asthma, atrial fibrillation, COPD, glaucoma, hypertension, neuropathy carotid duplex, high-grade right carotid artery stenosis and more mild left carotid artery stenosis. Per chart: "She had a CT scan which was officially read as 87 to 65% bilateral carotid artery stenosis although it appeared worse than that to my interpretation. With the disparate findings, catheter-based angiogram is indicated. The patient has a relatively high lesion and already has to undergo the angiogram and carotid artery stenting was felt to be  preferred to endarterectomy for that reason."    Limitations Walking;House hold activities;Standing    How long can you sit comfortably? not limited    Currently in Pain? No/denies            Neuro Re-Ed  Gait with horizontal head turns, CGA - 6x throughout clinic,  Gait with vertical head turns, CGA - 4x throughout clinic Seated around-the-worlds, CW and CC 15x each   Standing around-the-worlds, CW and CC 15x each- one instance of LOB posteriorly where pt regained balance with step strategy, CGA from PT  Sit<>stands compliant surface 2x6 CGA, pt reports fatigue Seated rest break between sets Standing on compliant surface stacking cups with twist laterally B directions, CGA - 2x through eac Tandem stance on compliant surface with horizontal head turns, B LEs - 2x30 sec each LE,CGA Standing compliant surface EC 2x30 sec, CGA  Assessment:  Pt performed majority of exercises without LOB and only with slight decrease in postural stability noted. Pt with one instance of LOB posteriorly with standing around-the-world exercise, where she required step strategy and CGA from PT to regain balance. She also did demonstrate some increased variability in BOS with ambulating with vertical and horizontal head turns. Issued HEP this session: tandem stance at counter and walking with vertical and horizontal head turns. PT instructed pt to have chair behind her with tandem stance exercise and counter in front of her as well in case she needs UE support. PT also instructed pt to only perform walking with vertical or horizontal head turns if she can do it next to a wall and with someone with her who can provide hand-held assistance or more for safety if she needs it. Pt reports no sx at end of session. Pt would benefit from further skilled therapy to improve static and dynamic balance.    Access Code: [email or web address] URL: [email or web address] Date: 09/18/2020 Prepared by: Ricard Dillon  Exercises Standing Tandem Balance with Counter Support - 1 x daily - 4 x weekly - 3 sets - 10 reps Walking with Head Rotation - 1 x daily - 4 x weekly - 3 sets - 10 reps  Addendum: PT called pt 09/19/2020 9:05 am to follow-up regarding adjustment to pt HEP. PT instructed pt not to perform walking with head rotations at home as pt reported during session she had medication change and is determining how it is affecting her balance still. PT instructed pt that they will only perform walking with head rotations when pt in clinic with PT.     PT Education - 09/18/20 1607    Education Details PT issued HEP (see HEP)    Person(s) Educated Patient    Methods Explanation;Demonstration;Handout    Comprehension Verbalized understanding;Returned demonstration            PT Short Term Goals - 09/11/20 1633      PT SHORT TERM GOAL #1   Title  Patient will be independent in home exercise program to improve strength/mobility for better functional independence with ADLs.    Baseline 09/11/2020 To issue HEP next session    Time 4    Period Weeks    Status New    Target Date 10/09/20             PT Long Term Goals - 09/11/20 1635      PT LONG TERM GOAL #1   Title Patient (> 52 years old) will complete five times sit to stand test in < 15 seconds indicating an increased LE strength and improved balance.    Baseline 09/11/2020 15.38 without UE support    Time 8    Period Weeks    Status New    Target Date 11/06/20      PT LONG TERM GOAL #2   Title Patient will increase FOTO score to equal to or greater than 53  to demonstrate statistically significant improvement in mobility and quality of life.    Baseline 09/11/2020 FOTO 45 (risk-adjusted 51)    Time 8    Period Weeks    Status New    Target Date 11/06/20      PT LONG TERM GOAL #3   Title Patient will increase Berg Balance score by 3 points to demonstrate decreased fall risk during functional activities.    Baseline 09/11/2020  BERG 53/56    Time 8    Period Weeks    Status New    Target Date 11/06/20      PT LONG TERM GOAL #4   Title Patient will reduce dizziness handicap inventory score to <20, for less dizziness with ADLs and increased safety with home and work tasks.    Baseline 09/11/2020 Score 40 indicating moderate handicap    Time 8    Period Weeks    Status New    Target Date 11/06/20      PT LONG TERM GOAL #5   Title Patient will increase ABC scale score >80% to demonstrate better functional mobility and better confidence with ADLs.    Baseline 09/11/2020 ABC score 30.6%    Time 8    Period Weeks    Status New    Target Date 11/06/20             Plan - 09/18/20 1620    Clinical Impression Statement Pt performed majority of exercises without LOB and only with slight decrease in postural stability noted. Pt with only one instance of LOB posteriorly with standing around-the-world exercise, where she required step strategy and CGA from PT to regain balance. She also did demonstrate some increased variability in BOS ambulating with vertical and horizontal head turns. Issued HEP this session: tandem stance at counter and walking with vertical and horizontal head turns. PT instructed pt to have chair behind her with tandem stance exercise and counter in front of her as well in case she needs UE support. PT also instructed pt to only perform walking with vertical or horizontal head turns if she can do it next to a wall and with someone with her who can provide hand-held assistance or more for safety if she needs it. Pt reports no sx at end of session.  Pt would benefit from further skilled therapy to improve static and dynamic balance.    Personal Factors and Comorbidities Age;Comorbidity 3+;Comorbidity 2;Comorbidity 1;Sex    Comorbidities pertinent: presyncopal episodes, asthma, atrial fibrillation, COPD, hypertension, neuropathy carotid duplex, high-grade right carotid artery stenosis  and more mild left  carotid artery stenosis    Examination-Activity Limitations Locomotion Level;Caring for Others;Bend;Dressing;Hygiene/Grooming;Stairs;Reach Overhead;Transfers    Examination-Participation Restrictions Community Activity;Driving;Interpersonal Relationship;Shop;Yard Work;Volunteer;Church;Cleaning;Laundry    Stability/Clinical Decision Making Evolving/Moderate complexity    Rehab Potential Good    PT Frequency 2x / week    PT Duration 8 weeks    PT Treatment/Interventions ADLs/Self Care Home Management;Biofeedback;Canalith Repostioning;Electrical Stimulation;Moist Heat;Ultrasound;DME Instruction;Gait training;Stair training;Functional mobility training;Therapeutic activities;Therapeutic exercise;Balance training;Neuromuscular re-education;Patient/family education;Orthotic Fit/Training;Manual techniques;Compression bandaging;Passive range of motion;Energy conservation;Vestibular;Joint Manipulations    PT Next Visit Plan dynamic balance    PT Home Exercise Plan HEP issued (see note)    Consulted and Agree with Plan of Care Patient           Patient will benefit from skilled therapeutic intervention in order to improve the following deficits and impairments:  Abnormal gait,Decreased balance,Difficulty walking,Decreased mobility,Decreased endurance,Decreased activity tolerance,Decreased coordination,Decreased strength,Impaired flexibility,Postural dysfunction,Improper body mechanics,Dizziness,Decreased range of motion,Hypomobility,Impaired sensation  Visit Diagnosis: Unsteadiness on feet  Muscle weakness (generalized)  Other abnormalities of gait and mobility     Problem List Patient Active Problem List   Diagnosis Date Noted  . Hypochromic microcytic anemia 09/17/2020  . Hyperlipidemia 09/17/2020  . Paroxysmal atrial fibrillation ([place]) 08/30/2020  . Carotid stenosis, right 08/15/2020  . Carotid stenosis 07/17/2020  . Meningioma ([place]) 07/04/2020  . Cyst of thyroid 07/03/2020  .  Hypothyroidism 07/03/2020  . Long-term use of high-risk medication 11/11/2016  . COPD with acute bronchitis ([place]) 11/11/2016  . Lower extremity edema 05/29/2016  . Elevated troponin   . Hypoxia   . Encounter for anticoagulation discussion and counseling   . Premature ventricular contraction 03/26/2015  . Vitamin D deficiency 03/26/2015  . Peripheral neuropathy 03/26/2015  . Hypertension 03/26/2015  . Asthma 03/26/2015   Ricard Dillon PT, DPT  09/18/2020, 4:35 PM  [place] MAIN [facility] SERVICES [address] [place], [place], [address] Phone: [phone]   Fax:  [phone]  Name: Molly Hodges MRN: [id number] Date of Birth: 06-24-1932

## 2020-09-19 DIAGNOSIS — R202 Paresthesia of skin: Secondary | ICD-10-CM | POA: Diagnosis not present

## 2020-09-19 DIAGNOSIS — R200 Anesthesia of skin: Secondary | ICD-10-CM | POA: Diagnosis not present

## 2020-09-19 DIAGNOSIS — R42 Dizziness and giddiness: Secondary | ICD-10-CM | POA: Diagnosis not present

## 2020-09-20 ENCOUNTER — Ambulatory Visit: Payer: Medicare Other

## 2020-09-24 ENCOUNTER — Ambulatory Visit: Payer: Medicare Other

## 2020-09-24 ENCOUNTER — Telehealth: Payer: Self-pay | Admitting: Cardiovascular Disease

## 2020-09-24 NOTE — Telephone Encounter (Signed)
Patients daughter calling in regarding patient assistance form for Eliquis. Daughter would like to know if form could possibly be emailed to her  Please advise

## 2020-09-24 NOTE — Telephone Encounter (Signed)
Spoke with Mrs. Petrizzo and daughter over the phone on speaker. Advised that link for Eliquis PA form was sent to her MyChart, she can cut and paste link into her Internet browser. Went over instructions for form completion, advised once completed, drop form back at office and will Dr. Rockey Situ sign and fax form back to [facility]. Turn around period is 4-6 weeks, if pt needs coverage until then, we can provide Eliquis samples. Pt and daughter verbalized understanding. Nothing further at this time and grateful for form link.

## 2020-09-26 ENCOUNTER — Ambulatory Visit: Payer: Medicare Other

## 2020-09-27 DIAGNOSIS — H903 Sensorineural hearing loss, bilateral: Secondary | ICD-10-CM | POA: Diagnosis not present

## 2020-09-27 DIAGNOSIS — R42 Dizziness and giddiness: Secondary | ICD-10-CM | POA: Diagnosis not present

## 2020-10-01 ENCOUNTER — Ambulatory Visit: Payer: Medicare Other

## 2020-10-02 ENCOUNTER — Ambulatory Visit: Payer: Self-pay | Admitting: *Deleted

## 2020-10-02 ENCOUNTER — Telehealth (INDEPENDENT_AMBULATORY_CARE_PROVIDER_SITE_OTHER): Payer: Medicare Other | Admitting: Physician Assistant

## 2020-10-02 DIAGNOSIS — J441 Chronic obstructive pulmonary disease with (acute) exacerbation: Secondary | ICD-10-CM

## 2020-10-02 DIAGNOSIS — J45909 Unspecified asthma, uncomplicated: Secondary | ICD-10-CM | POA: Diagnosis not present

## 2020-10-02 MED ORDER — PREDNISONE 20 MG PO TABS: 20.0000 mg | ORAL_TABLET | Freq: Every day | ORAL | 0 refills | Status: AC

## 2020-10-02 MED ORDER — IPRATROPIUM-ALBUTEROL 0.5-2.5 (3) MG/3ML IN SOLN: 3.0000 mL | RESPIRATORY_TRACT | 1 refills | Status: DC | PRN

## 2020-10-02 NOTE — Patient Instructions (Signed)

## 2020-10-02 NOTE — Progress Notes (Signed)
Established patient visit   Patient: Molly Hodges   DOB: Feb 23, 1932   87 y.o. Female  MRN: [id number] Visit Date: 10/02/2020  Today's healthcare provider: Trinna Post, PA-C   Chief Complaint  Patient presents with  . Asthma   Subjective    Asthma She complains of chest tightness, frequent throat clearing, shortness of breath and wheezing. This is a chronic problem. The current episode started in the past 7 days. The problem has been gradually worsening. Associated symptoms include dyspnea on exertion, nasal congestion and postnasal drip. Pertinent negatives include no fever, rhinorrhea or sneezing. Her symptoms are aggravated by minimal activity. Her symptoms are alleviated by steroid inhaler. She reports minimal improvement on treatment. Her past medical history is significant for asthma and COPD.    Patient with history of asthma on symbicort and albuterol presenting today with 5 days of worsening SOB and wheezing. Her oxygen is 96% measured on pulse ox. Declines COVID testing.     Medications: Outpatient Medications Prior to Visit  Medication Sig  . albuterol (PROVENTIL) (2.5 MG/3ML) 0.083% nebulizer solution TAKE 3 MLS (2.5 MG TOTAL) BY NEBULIZATION EVERY 4 HOURS AS NEEDED FORUP TO 12 DOSES FOR WHEEZING OR SHORTNESSOF BREATH  . albuterol (VENTOLIN HFA) 108 (90 Base) MCG/ACT inhaler Inhale 2 puffs into the lungs every 6 (six) hours as needed for wheezing or shortness of breath.  Marland Kitchen amiodarone (PACERONE) 200 MG tablet TAKE ONE TABLET EVERY DAY  . apixaban (ELIQUIS) 5 MG TABS tablet Take 1 tablet (5 mg total) by mouth 2 (two) times daily.  Marland Kitchen aspirin EC 81 MG EC tablet Take 1 tablet (81 mg total) by mouth daily at 6 (six) AM. Swallow whole.  . budesonide-formoterol (SYMBICORT) 160-4.5 MCG/ACT inhaler Inhale 2 puffs into the lungs 2 (two) times daily.  . Calcium Carbonate-Vitamin D 600-400 MG-UNIT tablet Take 1 tablet by mouth daily.  . cholecalciferol (VITAMIN D) 1000  UNITS tablet Take 1,000 Units by mouth daily.  . clopidogrel (PLAVIX) 75 MG tablet TAKE 1 TABLET BY MOUTH DAILY AT 6AM AS DIRECTED  . diltiazem (CARDIZEM CD) 180 MG 24 hr capsule TAKE 1 CAPSULE EVERY DAY (Patient taking differently: Take 180 mg by mouth daily.)  . dorzolamide (TRUSOPT) 2 % ophthalmic solution Place 1 drop into both eyes 2 (two) times daily.   Marland Kitchen ezetimibe (ZETIA) 10 MG tablet Take 1 tablet (10 mg total) by mouth daily.  Marland Kitchen gabapentin (NEURONTIN) 300 MG capsule TAKE 1 CAPSULE EVERY MORNING AND TAKE 2 CAPSULES AT BEDTIME (Patient taking differently: Take 300-600 mg by mouth 2 (two) times daily.)  . latanoprost (XALATAN) 0.005 % ophthalmic solution Place 1 drop into both eyes at bedtime.   . levalbuterol (XOPENEX HFA) 45 MCG/ACT inhaler Inhale 2 puffs into the lungs every 4 (four) hours as needed for wheezing.  . magnesium oxide (MAG-OX) 400 MG tablet Take 400 mg by mouth daily.  . Omega-3 Fatty Acids (FISH OIL BURP-LESS) 1000 MG CAPS Take 1,000 mg by mouth daily.   . potassium chloride (KLOR-CON) 10 MEQ tablet Take 1 tablet (10 mEq total) by mouth daily.  . rosuvastatin (CRESTOR) 20 MG tablet Take 1 tablet (20 mg total) by mouth daily.  . vitamin C (ASCORBIC ACID) 500 MG tablet Take 1,000 mg by mouth daily.  . vitamin E 100 UNIT capsule Take 200 Units by mouth daily.   No facility-administered medications prior to visit.    Review of Systems  Constitutional: Negative for fever.  HENT: Positive for postnasal drip. Negative for rhinorrhea and sneezing.   Respiratory: Positive for shortness of breath and wheezing.   Cardiovascular: Positive for dyspnea on exertion.       Objective    There were no vitals taken for this visit.    Physical Exam Constitutional:      Appearance: Normal appearance.  Pulmonary:     Effort: Pulmonary effort is normal. No respiratory distress.  Neurological:     Mental Status: She is alert.  Psychiatric:        Mood and Affect: Mood normal.         Behavior: Behavior normal.       No results found for any visits on 10/02/20.  Assessment & Plan    1. Uncomplicated asthma, unspecified asthma severity, unspecified whether persistent   2. COPD exacerbation (HCC)  Treat exacerbation as below, also sent in duoneb treatment for her to use every 4-6 hours since she already has a nebulizer.   - predniSONE (DELTASONE) 20 MG tablet; Take 1 tablet (20 mg total) by mouth daily with breakfast for 5 days.  Dispense: 5 tablet; Refill: 0    No follow-ups on file.      ITrinna Post, PA-C, have reviewed all documentation for this visit. The documentation on 10/03/20 for the exam, diagnosis, procedures, and orders are all accurate and complete.  The entirety of the information documented in the History of Present Illness, Review of Systems and Physical Exam were personally obtained by me. Portions of this information were initially documented by Wilburt Finlay, CMA and reviewed by me for thoroughness and accuracy.     Paulene Floor  [facility] [phone] (phone) [phone] (fax)  [place]

## 2020-10-02 NOTE — Telephone Encounter (Signed)
Pt called in having an asthma flare.  She is using her nebulizer every 4 hrs and her albuterol rescue inhaler.  These help some.  It's been a long time since I've had a flare up.   I don't know what triggered it.   Started 3 days ago.  The agent made her an appt for this morning at 11:00 with [facility].   Pt is refusing the ED. "I won't go to the emergency room".   "The last 2 times I was there it was 10 and 12 hours I sat there before someone could see me".    "I refused to go there".   "I'm ok until my appt at 11:00".    "I'm using my inhaler and it is helping but I do need help with the flare up". I instructed her to go to the ED if she becomes very short of breath with a lot of wheezing.  "I'll be ok until 11:00".  I sent my notes to [facility] for her appt this morning.    Reason for Disposition . Asthma medicine (nebulizer or inhaler) is needed more frequently than q 4 hours to keep you comfortable    Has appt at 11:00 this morning.  Refusing ED  Answer Assessment - Initial Assessment Questions 1. RESPIRATORY STATUS: "Describe your breathing?" (e.g., wheezing, shortness of breath, unable to speak, severe coughing)      I'm having an asthma attack.   It's been 3 days.   2. ONSET: "When did this asthma attack begin?"      3 days ago.    I took a shower and it triggered it this morning.   I do my breathing treatment and it helps a little. She is refusing to go to the ED.    3. TRIGGER: "What do you think triggered this attack?" (e.g., URI, exposure to pollen or other allergen, tobacco smoke)      I don't know.  I'm trying to get a pulmonary dr but I have to have a referral.   That's something I want to talk with the dr about at my 11:00 appt today. 4. PEAK EXPIRATORY FLOW RATE (PEFR): "Do you use a peak flow meter?" If Yes, ask: "What's the current peak flow? What's your personal best peak flow?"      No 5. SEVERITY: "How bad is this attack?"    - MILD:  No SOB at rest, mild SOB with walking, speaks normally in sentences, can lay down, no retractions, pulse < 100. (GREEN Zone: PEFR 80-100%)   - MODERATE: SOB at rest, SOB with minimal exertion and prefers to sit, cannot lie down flat, speaks in phrases, mild retractions, audible wheezing, pulse 100-120. (YELLOW Zone: PEFR 50-80%)    - SEVERE: Very SOB at rest, speaks in single words, struggling to breathe, sitting hunched forward, retractions, usually loud wheezing, sometimes minimal wheezing because of decreased air movement, pulse > 120. (RED Zone: PEFR < 50%).      Moderate 6. MEDICATIONS (Inhaler or nebs): "What are your asthma medications?" and "What treatments have you given so far?"    - Quick-relief: albuterol, metaproterenol, salbutamol, or other inhaled or nebulized beta-agonist medicines   - Long-term-control: steroids, cromolyn, or other anti-inflammatory medicines.     Using albuterol rescue inhaler and doing nebulizer treatment every 4 hours which helps some. 7. OTHER SYMPTOMS: "Do you have any other symptoms? (e.g., runny nose, chest pain, fever)     No just the asthma  flare with the shortness of breath. 8. PREGNANCY: "Is there any chance you are pregnant?" "When was your last menstrual period?"     N/A due to age  Protocols used: ASTHMA ATTACK-A-AH

## 2020-10-03 ENCOUNTER — Ambulatory Visit: Payer: Medicare Other

## 2020-10-03 ENCOUNTER — Telehealth: Payer: Self-pay | Admitting: Family Medicine

## 2020-10-03 NOTE — Telephone Encounter (Signed)
Please advise patient I talked to Dr. Rockey Situ about Eliquis dosage and we agreed that she should go down to 2.5mg  bid due to bleeding risks associated with her age and being on the Plavix and aspirin. We do have samples of the 2.5mg  tablets and she can have a months worth if she like.

## 2020-10-08 ENCOUNTER — Ambulatory Visit: Payer: Medicare Other

## 2020-10-08 NOTE — Progress Notes (Unsigned)
Subjective:   Molly Hodges is a 87 y.o. female who presents for Medicare Annual (Subsequent) preventive examination.  I connected with Diamonique Ruedas today by telephone and verified that I am speaking with the correct person using two identifiers. Location patient: home Location provider: work Persons participating in the virtual visit: patient, provider.   I discussed the limitations, risks, security and privacy concerns of performing an evaluation and management service by telephone and the availability of in person appointments. I also discussed with the patient that there may be a patient responsible charge related to this service. The patient expressed understanding and verbally consented to this telephonic visit.    Interactive audio and video telecommunications were attempted between this provider and patient, however failed, due to patient having technical difficulties OR patient did not have access to video capability.  We continued and completed visit with audio only.    Review of Systems    N/A  Cardiac Risk Factors include: advanced age (>56men, >7 women);dyslipidemia;hypertension     Objective:    There were no vitals filed for this visit. There is no height or weight on file to calculate BMI.  Advanced Directives 10/09/2020 09/11/2020 08/15/2020 05/14/2020 02/14/2020 02/14/2020 10/06/2019  Does Patient Have a Medical Advance Directive? Yes Yes Yes Yes No No Yes  Type of Paramedic of [place];Living will [place];Living will [place];Living will Living will;Healthcare Power of [place];Living will  Does patient want to make changes to medical advance directive? - - No - Patient declined No - Patient declined - - -  Copy of [place] in Chart? No - copy requested - No - copy requested No - copy requested - - Yes - validated most recent copy scanned in  chart (See row information)  Would patient like information on creating a medical advance directive? - - - - No - Patient declined - -    Current Medications (verified) Outpatient Encounter Medications as of 10/09/2020  Medication Sig  . albuterol (PROVENTIL) (2.5 MG/3ML) 0.083% nebulizer solution TAKE 3 MLS (2.5 MG TOTAL) BY NEBULIZATION EVERY 4 HOURS AS NEEDED FORUP TO 12 DOSES FOR WHEEZING OR SHORTNESSOF BREATH  . albuterol (VENTOLIN HFA) 108 (90 Base) MCG/ACT inhaler Inhale 2 puffs into the lungs every 6 (six) hours as needed for wheezing or shortness of breath.  Marland Kitchen amiodarone (PACERONE) 200 MG tablet TAKE ONE TABLET EVERY DAY  . apixaban (ELIQUIS) 5 MG TABS tablet Take 1 tablet (5 mg total) by mouth 2 (two) times daily.  Marland Kitchen aspirin EC 81 MG EC tablet Take 1 tablet (81 mg total) by mouth daily at 6 (six) AM. Swallow whole.  . budesonide-formoterol (SYMBICORT) 160-4.5 MCG/ACT inhaler Inhale 2 puffs into the lungs 2 (two) times daily.  . Calcium Carbonate-Vitamin D 600-400 MG-UNIT tablet Take 1 tablet by mouth daily.  . cholecalciferol (VITAMIN D) 1000 UNITS tablet Take 1,000 Units by mouth daily.  . clopidogrel (PLAVIX) 75 MG tablet TAKE 1 TABLET BY MOUTH DAILY AT 6AM AS DIRECTED  . diltiazem (CARDIZEM CD) 180 MG 24 hr capsule TAKE 1 CAPSULE EVERY DAY (Patient taking differently: Take 180 mg by mouth daily.)  . dorzolamide (TRUSOPT) 2 % ophthalmic solution Place 1 drop into both eyes 2 (two) times daily.   Marland Kitchen ezetimibe (ZETIA) 10 MG tablet Take 1 tablet (10 mg total) by mouth daily.  Marland Kitchen gabapentin (NEURONTIN) 300 MG capsule TAKE 1 CAPSULE EVERY  MORNING AND TAKE 2 CAPSULES AT BEDTIME (Patient taking differently: Take 300-600 mg by mouth 2 (two) times daily.)  . ipratropium-albuterol (DUONEB) 0.5-2.5 (3) MG/3ML SOLN Take 3 mLs by nebulization every 4 (four) hours as needed.  . latanoprost (XALATAN) 0.005 % ophthalmic solution Place 1 drop into both eyes at bedtime.   . levalbuterol (XOPENEX HFA) 45  MCG/ACT inhaler Inhale 2 puffs into the lungs every 4 (four) hours as needed for wheezing.  . magnesium oxide (MAG-OX) 400 MG tablet Take 400 mg by mouth daily.  . Omega-3 Fatty Acids (FISH OIL BURP-LESS) 1000 MG CAPS Take 1,000 mg by mouth daily.   . rosuvastatin (CRESTOR) 20 MG tablet Take 1 tablet (20 mg total) by mouth daily.  . vitamin C (ASCORBIC ACID) 500 MG tablet Take 1,000 mg by mouth daily.  . vitamin E 100 UNIT capsule Take 200 Units by mouth daily.  . potassium chloride (KLOR-CON) 10 MEQ tablet Take 1 tablet (10 mEq total) by mouth daily.   No facility-administered encounter medications on file as of 10/09/2020.    Allergies (verified) Shellfish allergy, Azithromycin, Tamiflu [oseltamivir phosphate], and Albuterol   History: Past Medical History:  Diagnosis Date  . Asthma   . Carotid artery disease ([place])   . Community acquired pneumonia   . COPD (chronic obstructive pulmonary disease) ([place])   . Essential hypertension   . Glaucoma   . Hyperlipidemia   . Mitral regurgitation    a. TTE 08/2015: EF 60-65%, normal wall motion, mild MR, mildly dilated left atrium measuring 40 mm, RVSF normal, PASP normal  . PAF (paroxysmal atrial fibrillation) ([place]) 09/17/2015   a. s/p DCCV 11/15/2015; CHADS2VASc => 4 (HTN, age x 2, female); c. on Eliquis   Past Surgical History:  Procedure Laterality Date  . ABDOMINAL HYSTERECTOMY  1987   due to heavy bleeding  . APPENDECTOMY    . CAROTID PTA/STENT INTERVENTION Right 08/15/2020   Procedure: CAROTID PTA/STENT INTERVENTION;  Surgeon: Algernon Huxley, MD;  Location: [facility];  Service: Cardiovascular;  Laterality: Right;  . CATARACT EXTRACTION    . ELECTROPHYSIOLOGIC STUDY N/A 11/15/2015   Procedure: CARDIOVERSION;  Surgeon: Minna Merritts, MD;  Location: [facility] OR[facility];  Service: Cardiovascular;  Laterality: N/A;   Family History  Problem Relation Age of Onset  . CAD Mother   . CAD Father   . Cancer Son 42       lung cancer    Social History   Socioeconomic History  . Marital status: Married    Spouse name: Not on file  . Number of children: 4  . Years of education: Not on file  . Highest education level: 12th grade  Occupational History  . Occupation: retired  Tobacco Use  . Smoking status: Never Smoker  . Smokeless tobacco: Never Used  Vaping Use  . Vaping Use: Never used  Substance and Sexual Activity  . Alcohol use: No  . Drug use: Never  . Sexual activity: Not on file  Other Topics Concern  . Not on file  Social History Narrative   Lives at home with Husband. Active and Independent at baseline.   Social Determinants of Health   Financial Resource Strain: Low Risk   . Difficulty of Paying Living Expenses: Not hard at all  Food Insecurity: No Food Insecurity  . Worried About Charity fundraiser in the Last Year: Never true  . Ran Out of Food in the Last Year: Never true  Transportation Needs: No  Transportation Needs  . Lack of Transportation (Medical): No  . Lack of Transportation (Non-Medical): No  Physical Activity: Inactive  . Days of Exercise per Week: 0 days  . Minutes of Exercise per Session: 0 min  Stress: No Stress Concern Present  . Feeling of Stress : Not at all  Social Connections: Moderately Isolated  . Frequency of Communication with Friends and Family: More than three times a week  . Frequency of Social Gatherings with Friends and Family: More than three times a week  . Attends Religious Services: Never  . Active Member of Clubs or Organizations: No  . Attends Archivist Meetings: Never  . Marital Status: Married    Tobacco Counseling Counseling given: Not Answered   Clinical Intake:  Pre-visit preparation completed: Yes  Pain : No/denies pain     Nutritional Risks: None Diabetes: No  How often do you need to have someone help you when you read instructions, pamphlets, or other written materials from your doctor or pharmacy?: 1 -  Never  Diabetic? No  Interpreter Needed?: No  Information entered by :: [facility], LPN   Activities of Daily Living In your present state of health, do you have any difficulty performing the following activities: 10/09/2020 09/17/2020  Hearing? N N  Vision? N -  Difficulty concentrating or making decisions? N N  Walking or climbing stairs? Y Y  Comment Due to SOB -  Dressing or bathing? N N  Doing errands, shopping? Y N  Comment Does not drive. -  Preparing Food and eating ? N -  Using the Toilet? N -  In the past six months, have you accidently leaked urine? N -  Do you have problems with loss of bowel control? N -  Managing your Medications? N -  Managing your Finances? N -  Housekeeping or managing your Housekeeping? N -  Some recent data might be hidden    Patient Care Team: Birdie Sons, MD as PCP - General (Family Medicine) Minna Merritts, MD as Consulting Physician (Cardiology) Leandrew Koyanagi, MD as Referring Physician (Ophthalmology) Lucky Cowboy Erskine Squibb, MD as Referring Physician (Vascular Surgery)  Indicate any recent Medical Services you may have received from other than [facility] providers in the past year (date may be approximate).     Assessment:   This is a routine wellness examination for Adeliz.  Hearing/Vision screen No exam data present  Dietary issues and exercise activities discussed: Current Exercise Habits: The patient does not participate in regular exercise at present, Exercise limited by: respiratory conditions(s)  Goals    . DIET - INCREASE WATER INTAKE     Recommend drinking 6-8 8 oz glasses of water a day.       Depression Screen PHQ 2/9 Scores 10/09/2020 09/17/2020 10/06/2019 10/19/2017 11/05/2016 11/05/2016  PHQ - 2 Score 0 0 0 0 0 0  PHQ- 9 Score - 0 - - 0 -    Fall Risk Fall Risk  10/09/2020 09/17/2020 07/03/2020 10/06/2019 10/19/2017  Falls in the past year? 0 0 0 0 No  Number falls in past yr: 0 0 0 0 -  Injury with Fall? 0 0 0 0 -   Risk for fall due to : - Impaired balance/gait - - -  Follow up - Falls evaluation completed Falls evaluation completed - -    FALL RISK PREVENTION PERTAINING TO THE HOME:  Any stairs in or around the home? No  If so, are there any without handrails? No  Home free of loose throw rugs in walkways, pet beds, electrical cords, etc? Yes  Adequate lighting in your home to reduce risk of falls? Yes   ASSISTIVE DEVICES UTILIZED TO PREVENT FALLS:  Life alert? No  Use of a cane, walker or w/c? No  Grab bars in the bathroom? Yes  Shower chair or bench in shower? Yes  Elevated toilet seat or a handicapped toilet? No    Cognitive Function: Normal cognitive status assessed by observation by this Nurse Health Advisor. No abnormalities found.       6CIT Screen 11/05/2016  What Year? 0 points  What month? 0 points  What time? 0 points  Count back from 20 0 points  Months in reverse 4 points  Repeat phrase 0 points  Total Score 4    Immunizations Immunization History  Administered Date(s) Administered  . Fluad Quad(high Dose 65+) 05/18/2019, 05/02/2020  . Influenza, High Dose Seasonal PF 08/28/2017, 05/29/2018  . PFIZER(Purple Top)SARS-COV-2 Vaccination 10/13/2019, 11/03/2019, 07/26/2020  . Pneumococcal Conjugate-13 05/30/2014  . Pneumococcal Polysaccharide-23 06/09/1985, 06/15/2006  . Zoster Recombinat (Shingrix) 05/05/2018, 07/20/2018    TDAP status: Due, Education has been provided regarding the importance of this vaccine. Advised may receive this vaccine at local pharmacy or Health Dept. Aware to provide a copy of the vaccination record if obtained from local pharmacy or Health Dept. Verbalized acceptance and understanding.  Flu Vaccine status: Up to date  Pneumococcal vaccine status: Up to date  Covid-19 vaccine status: Completed vaccines  Qualifies for Shingles Vaccine? Yes   Zostavax completed No   Shingrix Completed?: Yes  Screening Tests Health Maintenance  Topic  Date Due  . DEXA SCAN  10/09/2021 (Originally Apr 04, 1932)  . TETANUS/TDAP  10/09/2021 (Originally 10/20/1950)  . INFLUENZA VACCINE  Completed  . COVID-19 Vaccine  Completed  . PNA vac Low Risk Adult  Completed    Health Maintenance  There are no preventive care reminders to display for this patient.  Colorectal cancer screening: No longer required.   Mammogram status: No longer required due to age.  Bone Density status: Currently due, declined order today.   Lung Cancer Screening: (Low Dose CT Chest recommended if Age 55-80 years, 30 pack-year currently smoking OR have quit w/in 15years.) does not qualify.    Additional Screening:  Vision Screening: Recommended annual ophthalmology exams for early detection of glaucoma and other disorders of the eye. Is the patient up to date with their annual eye exam?  Yes  Who is the provider or what is the name of the office in which the patient attends annual eye exams? Dr Wallace Going If pt is not established with a provider, would they like to be referred to a provider to establish care? No .   Dental Screening: Recommended annual dental exams for proper oral hygiene  Community Resource Referral / Chronic Care Management: CRR required this visit?  No   CCM required this visit?  No      Plan:     I have personally reviewed and noted the following in the patient's chart:   . Medical and social history . Use of alcohol, tobacco or illicit drugs  . Current medications and supplements . Functional ability and status . Nutritional status . Physical activity . Advanced directives . List of other physicians . Hospitalizations, surgeries, and ER visits in previous 12 months . Vitals . Screenings to include cognitive, depression, and falls . Referrals and appointments  In addition, I have reviewed and discussed with patient certain  preventive protocols, quality metrics, and best practice recommendations. A written personalized care plan  for preventive services as well as general preventive health recommendations were provided to patient.     McKenzie [place], [place]   11/08/4008   Nurse Notes: Pt declined a DEXA scan order at this time.

## 2020-10-09 ENCOUNTER — Ambulatory Visit (INDEPENDENT_AMBULATORY_CARE_PROVIDER_SITE_OTHER): Payer: Medicare Other

## 2020-10-09 ENCOUNTER — Other Ambulatory Visit: Payer: Self-pay

## 2020-10-09 ENCOUNTER — Telehealth: Payer: Self-pay

## 2020-10-09 DIAGNOSIS — Z0000 Encounter for general adult medical examination without abnormal findings: Secondary | ICD-10-CM

## 2020-10-09 DIAGNOSIS — J432 Centrilobular emphysema: Secondary | ICD-10-CM

## 2020-10-09 DIAGNOSIS — J45909 Unspecified asthma, uncomplicated: Secondary | ICD-10-CM

## 2020-10-09 NOTE — Telephone Encounter (Signed)
Referral entered  

## 2020-10-09 NOTE — Patient Instructions (Signed)
[name] , Thank you for taking time to come for your Medicare Wellness Visit. I appreciate your ongoing commitment to your health goals. Please review the following plan we discussed and let me know if I can assist you in the future.   Screening recommendations/referrals: Colonoscopy: No longer required.  Mammogram: No longer required.  Bone Density: Currently due, declined order at this time.  Recommended yearly ophthalmology/optometry visit for glaucoma screening and checkup Recommended yearly dental visit for hygiene and checkup  Vaccinations: Influenza vaccine: Done 05/02/20  Pneumococcal vaccine: Completed series Tdap vaccine: Currently due, declined at this time. Shingles vaccine: Completed series    Advanced directives: Currently on file.  Conditions/risks identified: Recommend drinking 6-8 8 oz glasses of water a day.   Next appointment: 10/15/21 @ 1:20 PM with Dr Caryn Section. Declined scheduling a follow up with PCP at this time.    Preventive Care 40 Years and Older, Female Preventive care refers to lifestyle choices and visits with your health care provider that can promote health and wellness. What does preventive care include?  A yearly physical exam. This is also called an annual well check.  Dental exams once or twice a year.  Routine eye exams. Ask your health care provider how often you should have your eyes checked.  Personal lifestyle choices, including:  Daily care of your teeth and gums.  Regular physical activity.  Eating a healthy diet.  Avoiding tobacco and drug use.  Limiting alcohol use.  Practicing safe sex.  Taking low-dose aspirin every day.  Taking vitamin and mineral supplements as recommended by your health care provider. What happens during an annual well check? The services and screenings done by your health care provider during your annual well check will depend on your age, overall health, lifestyle risk factors, and family history  of disease. Counseling  Your health care provider may ask you questions about your:  Alcohol use.  Tobacco use.  Drug use.  Emotional well-being.  Home and relationship well-being.  Sexual activity.  Eating habits.  History of falls.  Memory and ability to understand (cognition).  Work and work Statistician.  Reproductive health. Screening  You may have the following tests or measurements:  Height, weight, and BMI.  Blood pressure.  Lipid and cholesterol levels. These may be checked every 5 years, or more frequently if you are over 47 years old.  Skin check.  Lung cancer screening. You may have this screening every year starting at age 12 if you have a 30-pack-year history of smoking and currently smoke or have quit within the past 15 years.  Fecal occult blood test (FOBT) of the stool. You may have this test every year starting at age 92.  Flexible sigmoidoscopy or colonoscopy. You may have a sigmoidoscopy every 5 years or a colonoscopy every 10 years starting at age 39.  Hepatitis C blood test.  Hepatitis B blood test.  Sexually transmitted disease (STD) testing.  Diabetes screening. This is done by checking your blood sugar (glucose) after you have not eaten for a while (fasting). You may have this done every 1-3 years.  Bone density scan. This is done to screen for osteoporosis. You may have this done starting at age 33.  Mammogram. This may be done every 1-2 years. Talk to your health care provider about how often you should have regular mammograms. Talk with your health care provider about your test results, treatment options, and if necessary, the need for more tests. Vaccines  Your health  care provider may recommend certain vaccines, such as:  Influenza vaccine. This is recommended every year.  Tetanus, diphtheria, and acellular pertussis (Tdap, Td) vaccine. You may need a Td booster every 10 years.  Zoster vaccine. You may need this after age  81.  Pneumococcal 13-valent conjugate (PCV13) vaccine. One dose is recommended after age 1.  Pneumococcal polysaccharide (PPSV23) vaccine. One dose is recommended after age 45. Talk to your health care provider about which screenings and vaccines you need and how often you need them. This information is not intended to replace advice given to you by your health care provider. Make sure you discuss any questions you have with your health care provider. Document Released: 08/31/2015 Document Revised: 04/23/2016 Document Reviewed: 06/05/2015 Elsevier Interactive Patient Education  2017 [place] Prevention in the Home Falls can cause injuries. They can happen to people of all ages. There are many things you can do to make your home safe and to help prevent falls. What can I do on the outside of my home?  Regularly fix the edges of walkways and driveways and fix any cracks.  Remove anything that might make you trip as you walk through a door, such as a raised step or threshold.  Trim any bushes or trees on the path to your home.  Use bright outdoor lighting.  Clear any walking paths of anything that might make someone trip, such as rocks or tools.  Regularly check to see if handrails are loose or broken. Make sure that both sides of any steps have handrails.  Any raised decks and porches should have guardrails on the edges.  Have any leaves, snow, or ice cleared regularly.  Use sand or salt on walking paths during winter.  Clean up any spills in your garage right away. This includes oil or grease spills. What can I do in the bathroom?  Use night lights.  Install grab bars by the toilet and in the tub and shower. Do not use towel bars as grab bars.  Use non-skid mats or decals in the tub or shower.  If you need to sit down in the shower, use a plastic, non-slip stool.  Keep the floor dry. Clean up any water that spills on the floor as soon as it happens.  Remove  soap buildup in the tub or shower regularly.  Attach bath mats securely with double-sided non-slip rug tape.  Do not have throw rugs and other things on the floor that can make you trip. What can I do in the bedroom?  Use night lights.  Make sure that you have a light by your bed that is easy to reach.  Do not use any sheets or blankets that are too big for your bed. They should not hang down onto the floor.  Have a firm chair that has side arms. You can use this for support while you get dressed.  Do not have throw rugs and other things on the floor that can make you trip. What can I do in the kitchen?  Clean up any spills right away.  Avoid walking on wet floors.  Keep items that you use a lot in easy-to-reach places.  If you need to reach something above you, use a strong step stool that has a grab bar.  Keep electrical cords out of the way.  Do not use floor polish or wax that makes floors slippery. If you must use wax, use non-skid floor wax.  Do not have  throw rugs and other things on the floor that can make you trip. What can I do with my stairs?  Do not leave any items on the stairs.  Make sure that there are handrails on both sides of the stairs and use them. Fix handrails that are broken or loose. Make sure that handrails are as long as the stairways.  Check any carpeting to make sure that it is firmly attached to the stairs. Fix any carpet that is loose or worn.  Avoid having throw rugs at the top or bottom of the stairs. If you do have throw rugs, attach them to the floor with carpet tape.  Make sure that you have a light switch at the top of the stairs and the bottom of the stairs. If you do not have them, ask someone to add them for you. What else can I do to help prevent falls?  Wear shoes that:  Do not have high heels.  Have rubber bottoms.  Are comfortable and fit you well.  Are closed at the toe. Do not wear sandals.  If you use a  stepladder:  Make sure that it is fully opened. Do not climb a closed stepladder.  Make sure that both sides of the stepladder are locked into place.  Ask someone to hold it for you, if possible.  Clearly mark and make sure that you can see:  Any grab bars or handrails.  First and last steps.  Where the edge of each step is.  Use tools that help you move around (mobility aids) if they are needed. These include:  Canes.  Walkers.  Scooters.  Crutches.  Turn on the lights when you go into a dark area. Replace any light bulbs as soon as they burn out.  Set up your furniture so you have a clear path. Avoid moving your furniture around.  If any of your floors are uneven, fix them.  If there are any pets around you, be aware of where they are.  Review your medicines with your doctor. Some medicines can make you feel dizzy. This can increase your chance of falling. Ask your doctor what other things that you can do to help prevent falls. This information is not intended to replace advice given to you by your health care provider. Make sure you discuss any questions you have with your health care provider. Document Released: 05/31/2009 Document Revised: 01/10/2016 Document Reviewed: 09/08/2014 Elsevier Interactive Patient Education  2017 [employer].

## 2020-10-09 NOTE — Telephone Encounter (Signed)
Spoke with Molly Hodges today to complete her AWV and Molly Hodges requested a referral to see Dr Vita Barley (pulmonologist). Molly Hodges states she has to have a referral from her PCP first. Molly Hodges is wanting this referral due to her asthma and ongoing SOB. Please advise, thank you.

## 2020-10-10 ENCOUNTER — Other Ambulatory Visit: Payer: Self-pay | Admitting: Family Medicine

## 2020-10-10 ENCOUNTER — Ambulatory Visit: Payer: Medicare Other

## 2020-10-14 ENCOUNTER — Other Ambulatory Visit: Payer: Self-pay | Admitting: Physician Assistant

## 2020-10-15 ENCOUNTER — Telehealth: Payer: Self-pay | Admitting: Family Medicine

## 2020-10-15 ENCOUNTER — Ambulatory Visit: Payer: Medicare Other | Admitting: Physical Therapy

## 2020-10-15 NOTE — Telephone Encounter (Signed)
Per [facility] Poisson once they get a slot open in April they will call to schedule apt. Pt advised and stated understanding.

## 2020-10-15 NOTE — Telephone Encounter (Signed)
Patient call to ask McKenzie to call her regarding her recent wellness appt.  She stated she had a few questions.  CB# [phone]

## 2020-10-15 NOTE — Telephone Encounter (Signed)
Patient is wanting to know the status of the referral to pulmonologist. Current status says "pending." Will check with referrals coordinator and CB.

## 2020-10-17 ENCOUNTER — Ambulatory Visit: Payer: Medicare Other

## 2020-10-18 MED ORDER — APIXABAN 2.5 MG PO TABS: 2.5000 mg | ORAL_TABLET | Freq: Two times a day (BID) | ORAL | 0 refills | Status: DC

## 2020-10-18 NOTE — Telephone Encounter (Signed)
Yes, that is the dose she will be staying on for the foreseeable future

## 2020-10-18 NOTE — Telephone Encounter (Signed)
Patient advised and verbalized understanding 

## 2020-10-18 NOTE — Telephone Encounter (Signed)
Patient advised and verbalized understanding. Samples left up front for pick up. Patient wants to know if she is going to be on this dose for a while. She says she uses a medication assistance program to help get her medications and she needs to let them know if this is the dosage that she will now be on. Please advise.

## 2020-10-22 ENCOUNTER — Telehealth: Payer: Self-pay

## 2020-10-22 MED ORDER — APIXABAN 2.5 MG PO TABS: 2.5000 mg | ORAL_TABLET | Freq: Two times a day (BID) | ORAL | 3 refills | Status: DC

## 2020-10-22 NOTE — Telephone Encounter (Signed)
Pt dropped off PA application for Eliquis 2.5mg  BID, Dr. Rockey Situ signed application form, completed form and signed scrip faxed to [facility].

## 2020-10-23 ENCOUNTER — Telehealth: Payer: Self-pay

## 2020-10-23 NOTE — Telephone Encounter (Signed)
[employer] sent a fax stating Mrs. Molly Hodges is not eligible for PA at this time for Eliquis since she has not  spent 3% of her annual household income on out-of-pocket prescription expenses. A letter was also sent to Mrs. Txaxton as well via [employer].  Case # [id number]

## 2020-10-30 ENCOUNTER — Encounter: Payer: Self-pay | Admitting: Family Medicine

## 2020-10-31 ENCOUNTER — Telehealth: Payer: Self-pay | Admitting: Family Medicine

## 2020-10-31 NOTE — Telephone Encounter (Signed)
You could try dramamine. May cause drowsiness. Could start with 1/2 tab and see how you tolerate.

## 2020-10-31 NOTE — Telephone Encounter (Signed)
Patient's daughter called to ask if the doctor had a chance to respond to a My Chart message regarding her medication, dramamine.  Please call or send a reply to daughter.  CB# [phone] or call the patient.

## 2020-10-31 NOTE — Telephone Encounter (Signed)
Please review my chart message sent last night. Dr. Caryn Section has not reviewed message yet and he is not in the office.

## 2020-11-01 ENCOUNTER — Telehealth: Payer: Self-pay

## 2020-11-01 ENCOUNTER — Ambulatory Visit: Payer: Self-pay | Admitting: *Deleted

## 2020-11-01 NOTE — Telephone Encounter (Signed)
History of dizziness back if the fall- multiple test- procedure and  dizziness stopped. Patient states she had started back.-dizzy,  weak and not active, 130/60, O2 sat normal, head feels heavy, off balance.  Appointment scheduled for evaluation of symptoms.  Reason for Disposition . [1] MODERATE dizziness (e.g., interferes with normal activities) AND [2] has NOT been evaluated by physician for this  (Exception: dizziness caused by heat exposure, sudden standing, or poor fluid intake)  Answer Assessment - Initial Assessment Questions 1. DESCRIPTION: "Describe your dizziness."     Woozy- has to use walker 2. LIGHTHEADED: "Do you feel lightheaded?" (e.g., somewhat faint, woozy, weak upon standing)     Woozy, weakness 3. VERTIGO: "Do you feel like either you or the room is spinning or tilting?" (i.e. vertigo)     no 4. SEVERITY: "How bad is it?"  "Do you feel like you are going to faint?" "Can you stand and walk?"   - MILD: Feels slightly dizzy, but walking normally.   - MODERATE: Feels very unsteady when walking, but not falling; interferes with normal activities (e.g., school, work) .   - SEVERE: Unable to walk without falling, or requires assistance to walk without falling; feels like passing out now.     moderate 5. ONSET:  "When did the dizziness begin?"     Started last Thursday  6. AGGRAVATING FACTORS: "Does anything make it worse?" (e.g., standing, change in head position)     no 7. HEART RATE: "Can you tell me your heart rate?" "How many beats in 15 seconds?"  (Note: not all patients can do this)       unsure 8. CAUSE: "What do you think is causing the dizziness?"     unsure 9. RECURRENT SYMPTOM: "Have you had dizziness before?" If Yes, ask: "When was the last time?" "What happened that time?"     Yes- patient was given medication. 10. OTHER SYMPTOMS: "Do you have any other symptoms?" (e.g., fever, chest pain, vomiting, diarrhea, bleeding)       no 11. PREGNANCY: "Is there any  chance you are pregnant?" "When was your last menstrual period?"       n/a  Protocols used: DIZZINESS [facility]

## 2020-11-01 NOTE — Telephone Encounter (Signed)
Copied from [place] [id number]. Topic: Appointment Scheduling - Scheduling Inquiry for Clinic >> Nov 01, 2020  1:21 PM Oneta Rack wrote: Patient experiencing dizziness and would a sooner appointment then 11/13/2020. Patient daughter in law was transferred to [facility] Nurse triage regarding the dizziness. Please call [phone]

## 2020-11-01 NOTE — Telephone Encounter (Signed)
Patient advised via mychart message.

## 2020-11-02 ENCOUNTER — Encounter: Payer: Self-pay | Admitting: Physician Assistant

## 2020-11-02 ENCOUNTER — Ambulatory Visit: Payer: Medicare Other | Admitting: Adult Health

## 2020-11-02 ENCOUNTER — Ambulatory Visit (INDEPENDENT_AMBULATORY_CARE_PROVIDER_SITE_OTHER): Payer: Medicare Other | Admitting: Physician Assistant

## 2020-11-02 ENCOUNTER — Other Ambulatory Visit: Payer: Self-pay

## 2020-11-02 VITALS — BP 178/51 | HR 79 | Temp 97.5°F | Wt 129.2 lb

## 2020-11-02 DIAGNOSIS — I480 Paroxysmal atrial fibrillation: Secondary | ICD-10-CM

## 2020-11-02 DIAGNOSIS — R42 Dizziness and giddiness: Secondary | ICD-10-CM

## 2020-11-02 NOTE — Patient Instructions (Signed)
Trial off of gabapentin for three days, then follow up with Dr. Rockey Situ to discuss amiodarone

## 2020-11-02 NOTE — Progress Notes (Signed)
Established patient visit   Patient: Molly Hodges   DOB: Aug 19, 1931   87 y.o. Female  MRN: [id number] Visit Date: 11/02/2020  Today's healthcare provider: Trinna Post, PA-C   Chief Complaint  Patient presents with  . Dizziness  I,Porsha C McClurkin,acting as a scribe for Trinna Post, PA-C.,have documented all relevant documentation on the behalf of Trinna Post, PA-C,as directed by  Trinna Post, PA-C while in the presence of Trinna Post, PA-C.  Subjective    Dizziness This is a recurrent problem. The current episode started more than 1 month ago. The problem has been gradually worsening. Associated symptoms include nausea. Pertinent negatives include no chest pain, fatigue, headaches, numbness, sore throat, urinary symptoms, vertigo, visual change or weakness. The symptoms are aggravated by bending, standing, walking and twisting.    Patient reports dizziness ongoing for several months. Patient reports she has been to see neurologist, vascular surgeon, and ENT. Neurology workup was unrevealing, head imaging did not show any masses. She underwent carotid stenting for partial obstruction which was unhelpful. She has been to [facility] ENT and does not have vertigo. She was stopped on her HCTZ which she reports helped for a time but now dizziness is back. She has a history of a fib and is on amiodarone, diltiazem for this and followed by [facility] cardiology. She is also taking gabapentin for peripheral neuropathy. She reports she will feel off balance and staggering.      Medications: Outpatient Medications Prior to Visit  Medication Sig  . albuterol (PROVENTIL) (2.5 MG/3ML) 0.083% nebulizer solution TAKE 3 MLS (2.5 MG TOTAL) BY NEBULIZATION EVERY 4 HOURS AS NEEDED FORUP TO 12 DOSES FOR WHEEZING OR SHORTNESSOF BREATH  . albuterol (VENTOLIN HFA) 108 (90 Base) MCG/ACT inhaler Inhale 2 puffs into the lungs every 6 (six) hours as needed for wheezing or shortness of  breath.  Marland Kitchen amiodarone (PACERONE) 200 MG tablet TAKE ONE TABLET EVERY DAY  . apixaban (ELIQUIS) 2.5 MG TABS tablet Take 1 tablet (2.5 mg total) by mouth 2 (two) times daily.  Marland Kitchen aspirin EC 81 MG EC tablet Take 1 tablet (81 mg total) by mouth daily at 6 (six) AM. Swallow whole.  . budesonide-formoterol (SYMBICORT) 160-4.5 MCG/ACT inhaler Inhale 2 puffs into the lungs 2 (two) times daily.  . Calcium Carbonate-Vitamin D 600-400 MG-UNIT tablet Take 1 tablet by mouth daily.  . cholecalciferol (VITAMIN D) 1000 UNITS tablet Take 1,000 Units by mouth daily.  . clopidogrel (PLAVIX) 75 MG tablet TAKE 1 TABLET BY MOUTH DAILY AT 6AM AS DIRECTED  . diltiazem (CARDIZEM CD) 180 MG 24 hr capsule TAKE 1 CAPSULE EVERY DAY (Patient taking differently: Take 180 mg by mouth daily.)  . dorzolamide (TRUSOPT) 2 % ophthalmic solution Place 1 drop into both eyes 2 (two) times daily.   Marland Kitchen ezetimibe (ZETIA) 10 MG tablet Take 1 tablet (10 mg total) by mouth daily.  Marland Kitchen gabapentin (NEURONTIN) 300 MG capsule TAKE 1 CAPSULE EVERY MORNING AND TAKE 2 CAPSULES AT BEDTIME  . ipratropium-albuterol (DUONEB) 0.5-2.5 (3) MG/3ML SOLN Take 3 mLs by nebulization every 4 (four) hours as needed.  . latanoprost (XALATAN) 0.005 % ophthalmic solution Place 1 drop into both eyes at bedtime.   . levalbuterol (XOPENEX HFA) 45 MCG/ACT inhaler Inhale 2 puffs into the lungs every 4 (four) hours as needed for wheezing.  . magnesium oxide (MAG-OX) 400 MG tablet Take 400 mg by mouth daily.  . Omega-3 Fatty Acids (FISH OIL  BURP-LESS) 1000 MG CAPS Take 1,000 mg by mouth daily.   . potassium chloride (KLOR-CON) 10 MEQ tablet TAKE 1 TABLET BY MOUTH DAILY  . rosuvastatin (CRESTOR) 20 MG tablet Take 1 tablet (20 mg total) by mouth daily.  . vitamin C (ASCORBIC ACID) 500 MG tablet Take 1,000 mg by mouth daily.  . vitamin E 100 UNIT capsule Take 200 Units by mouth daily.   No facility-administered medications prior to visit.    Review of Systems   Constitutional: Negative for fatigue.  HENT: Negative for sore throat.   Cardiovascular: Negative for chest pain.  Gastrointestinal: Positive for nausea.  Neurological: Positive for dizziness. Negative for vertigo, weakness, numbness and headaches.       Objective    BP (!) 178/51 (BP Location: Left Arm, Patient Position: Sitting, Cuff Size: Normal)   Pulse 79   Temp (!) 97.5 F (36.4 C) (Oral)   Wt 129 lb 3.2 oz (58.6 kg)   SpO2 98%   BMI 21.50 kg/m     Physical Exam Constitutional:      Appearance: Normal appearance.  Cardiovascular:     Rate and Rhythm: Normal rate and regular rhythm.     Heart sounds: Normal heart sounds.  Pulmonary:     Effort: Pulmonary effort is normal.     Breath sounds: Normal breath sounds.  Skin:    General: Skin is warm and dry.  Neurological:     Mental Status: She is alert and oriented to person, place, and time. Mental status is at baseline.  Psychiatric:        Mood and Affect: Mood normal.        Behavior: Behavior normal.       No results found for any visits on 11/02/20.  Assessment & Plan    1. Dizziness  Patient has had unrevealing workups from multiple specialties including neurology, vascular surgery, and ENT. She is not orthostatic today. In light of this, I am concerned she is having medication side effects. The two I would be most worried about are gabapentin and amiodarone. I have suggested a trial off gabapentin to see if she has an improvement in symptoms. I think a discussion with her cardiologist would be necessary regarding her amiodarone as I do not feel comfortable changing that. She has follow up with Dr. Rockey Situ in 01/2021 and has been advised to call and move up appointment if she would like to be see sooner. Patient agreeable.   - EKG 12-Lead  2. Paroxysmal atrial fibrillation (HCC)    Return if symptoms worsen or fail to improve.      ITrinna Post, PA-C, have reviewed all documentation for this  visit. The documentation on 11/02/20 for the exam, diagnosis, procedures, and orders are all accurate and complete.  The entirety of the information documented in the History of Present Illness, Review of Systems and Physical Exam were personally obtained by me. Portions of this information were initially documented by [facility] and reviewed by me for thoroughness and accuracy.     Paulene Floor  [facility] [phone] (phone) [phone] (fax)  [place]

## 2020-11-13 ENCOUNTER — Other Ambulatory Visit: Payer: Self-pay

## 2020-11-13 ENCOUNTER — Encounter (INDEPENDENT_AMBULATORY_CARE_PROVIDER_SITE_OTHER): Payer: Medicare Other | Admitting: Family Medicine

## 2020-11-13 VITALS — BP 159/60 | HR 70 | Ht 62.0 in | Wt 133.0 lb

## 2020-11-13 DIAGNOSIS — G629 Polyneuropathy, unspecified: Secondary | ICD-10-CM | POA: Diagnosis not present

## 2020-11-13 DIAGNOSIS — R42 Dizziness and giddiness: Secondary | ICD-10-CM | POA: Diagnosis not present

## 2020-11-13 DIAGNOSIS — D509 Iron deficiency anemia, unspecified: Secondary | ICD-10-CM

## 2020-11-13 DIAGNOSIS — J45909 Unspecified asthma, uncomplicated: Secondary | ICD-10-CM | POA: Diagnosis not present

## 2020-11-13 DIAGNOSIS — E876 Hypokalemia: Secondary | ICD-10-CM

## 2020-11-13 DIAGNOSIS — I739 Peripheral vascular disease, unspecified: Secondary | ICD-10-CM | POA: Diagnosis not present

## 2020-11-13 MED ORDER — GABAPENTIN 100 MG PO CAPS: 100.0000 mg | ORAL_CAPSULE | Freq: Every day | ORAL | 0 refills | Status: DC

## 2020-11-13 NOTE — Progress Notes (Deleted)
This encounter was created in error - please disregard.

## 2020-11-13 NOTE — Progress Notes (Signed)
Established patient visit   Patient: Molly Hodges   DOB: 1931-09-08   87 y.o. Female  MRN: [id number] Visit Date: 11/13/2020  Today's healthcare provider: Lelon Huh, MD   No chief complaint on file.  Subjective    HPI  Patient presents for follow up on dizziness. Patient was last seen on 11/02/2020 by Carles Collet, PA-C. Fabio Bering suggested a trial off gabapentin to see if she has an improvement in symptoms. Patient said she tried this and it did not work as she had withdrawals from the medication 2 days after stopping medication. She has since restarted gabapentin but only taking one at bedtime. It was also discussed that amiodarone could be contributing to dizziness. She had Holter monitor in October and states dizziness was not occurring at that time.  She states that dizziness comes and goes. She has it most days, but not all day every day. No particular triggers. She reports that she did check her BP in the morning last weekend when she was extremely dizzy and it was in the 90s/40s.   She had extensively vestibular testing by ENT with no abnormal findings.    Associated symptoms: No hearing loss No tinnitus  No chest discomfort No heart palpitations  No heart racing Yes numbness or tingling of extremities  Yes nausea Yes vomiting  No speech difficulty No visual changes    Wt Readings from Last 3 Encounters:  11/02/20 129 lb 3.2 oz (58.6 kg)  09/17/20 134 lb (60.8 kg)  08/15/20 136 lb (61.7 kg)    BP Readings from Last 3 Encounters:  11/02/20 (!) 178/51  09/18/20 (!) 156/40  09/17/20 (!) 145/55      Lab Results  Component Value Date   WBC 10.6 09/17/2020   HGB 11.6 09/17/2020   HCT 38.0 09/17/2020   MCV 77 (L) 09/17/2020   PLT 265 09/17/2020   Lab Results  Component Value Date   NA 143 09/17/2020   K 4.2 09/17/2020   CO2 24 09/17/2020   BUN 17 09/17/2020   CREATININE 1.03 (H) 09/17/2020   CALCIUM 9.0 09/17/2020   GLUCOSE 89 09/17/2020      ---------------------------------------------------------------------------------------------------       Medications: Outpatient Medications Prior to Visit  Medication Sig  . albuterol (PROVENTIL) (2.5 MG/3ML) 0.083% nebulizer solution TAKE 3 MLS (2.5 MG TOTAL) BY NEBULIZATION EVERY 4 HOURS AS NEEDED FORUP TO 12 DOSES FOR WHEEZING OR SHORTNESSOF BREATH  . albuterol (VENTOLIN HFA) 108 (90 Base) MCG/ACT inhaler Inhale 2 puffs into the lungs every 6 (six) hours as needed for wheezing or shortness of breath.  Marland Kitchen amiodarone (PACERONE) 200 MG tablet TAKE ONE TABLET EVERY DAY  . apixaban (ELIQUIS) 2.5 MG TABS tablet Take 1 tablet (2.5 mg total) by mouth 2 (two) times daily.  Marland Kitchen aspirin EC 81 MG EC tablet Take 1 tablet (81 mg total) by mouth daily at 6 (six) AM. Swallow whole.  . budesonide-formoterol (SYMBICORT) 160-4.5 MCG/ACT inhaler Inhale 2 puffs into the lungs 2 (two) times daily.  . Calcium Carbonate-Vitamin D 600-400 MG-UNIT tablet Take 1 tablet by mouth daily.  . cholecalciferol (VITAMIN D) 1000 UNITS tablet Take 1,000 Units by mouth daily.  . clopidogrel (PLAVIX) 75 MG tablet TAKE 1 TABLET BY MOUTH DAILY AT 6AM AS DIRECTED  . diltiazem (CARDIZEM CD) 180 MG 24 hr capsule TAKE 1 CAPSULE EVERY DAY (Patient taking differently: Take 180 mg by mouth daily.)  . dorzolamide (TRUSOPT) 2 % ophthalmic solution Place 1  drop into both eyes 2 (two) times daily.   Marland Kitchen ezetimibe (ZETIA) 10 MG tablet Take 1 tablet (10 mg total) by mouth daily.  Marland Kitchen gabapentin (NEURONTIN) 300 MG capsule TAKE 1 CAPSULE EVERY MORNING AND TAKE 2 CAPSULES AT BEDTIME  . ipratropium-albuterol (DUONEB) 0.5-2.5 (3) MG/3ML SOLN Take 3 mLs by nebulization every 4 (four) hours as needed.  . latanoprost (XALATAN) 0.005 % ophthalmic solution Place 1 drop into both eyes at bedtime.   . levalbuterol (XOPENEX HFA) 45 MCG/ACT inhaler Inhale 2 puffs into the lungs every 4 (four) hours as needed for wheezing.  . magnesium oxide (MAG-OX) 400 MG  tablet Take 400 mg by mouth daily.  . Omega-3 Fatty Acids (FISH OIL BURP-LESS) 1000 MG CAPS Take 1,000 mg by mouth daily.   . potassium chloride (KLOR-CON) 10 MEQ tablet TAKE 1 TABLET BY MOUTH DAILY  . rosuvastatin (CRESTOR) 20 MG tablet Take 1 tablet (20 mg total) by mouth daily.  . vitamin C (ASCORBIC ACID) 500 MG tablet Take 1,000 mg by mouth daily.  . vitamin E 100 UNIT capsule Take 200 Units by mouth daily.   No facility-administered medications prior to visit.       Objective    BP (!) 159/60 (BP Location: Right Arm, Patient Position: Sitting, Cuff Size: Normal)   Pulse 70   Ht 5\' 2"  (1.575 m)   Wt 133 lb (60.3 kg)   SpO2 100%   BMI 24.33 kg/m     Physical Exam    General: Appearance:    Well developed, well nourished female in no acute distress  Eyes:    PERRL, conjunctiva/corneas clear, EOM's intact       Lungs:     Clear to auscultation bilaterally, respirations unlabored  Heart:    Normal heart rate. Regularly irregular rhythm. No murmurs, rubs, or gallops.   MS:   All extremities are intact.   Neurologic:   Awake, alert, oriented x 3. No apparent focal neurological           defect.         Assessment & Plan     1. Dizziness Occurring episodically. Has had complete vestibular work up which was normal. Last hemoglobin in January was normal. Symptoms did not occur before she was started on amiodarone. She is also working on weaning gabapentin as below, although I think that is less likely. She had a cardiac monitor in October, but was not having dizziness at that time. I think another cardiac monitor is warranted and will see if can get  Zio monitor ordered.   2. PAD (peripheral artery disease) ([place]) Follow up by vascular.   3. Uncomplicated asthma, unspecified asthma severity, unspecified whether persistent   4. Iron deficiency anemia, unspecified iron deficiency anemia type  - CBC - Iron, TIBC and Ferritin Panel  5. Hypokalemia  - Potassium  6.  Peripheral polyneuropathy Is weaning off of gabapentin, will reduce to - gabapentin (NEURONTIN) 100 MG capsule; Take 1 capsule (100 mg total) by mouth at bedtime.  Dispense: 30 capsule; Refill: 0        The entirety of the information documented in the History of Present Illness, Review of Systems and Physical Exam were personally obtained by me. Portions of this information were initially documented by the CMA and reviewed by me for thoroughness and accuracy.  Lelon Huh, MD  [facility] [phone] (phone) [phone] (fax)  [place]

## 2020-11-14 LAB — CBC
Hematocrit: 37.4 % (ref 34.0–46.6)
Hemoglobin: 11.5 g/dL (ref 11.1–15.9)
MCH: 23.1 pg — ABNORMAL LOW (ref 26.6–33.0)
MCHC: 30.7 g/dL — ABNORMAL LOW (ref 31.5–35.7)
MCV: 75 fL — ABNORMAL LOW (ref 79–97)
Platelets: 299 10*3/uL (ref 150–450)
RBC: 4.97 x10E6/uL (ref 3.77–5.28)
RDW: 16.4 % — ABNORMAL HIGH (ref 11.7–15.4)
WBC: 12 10*3/uL — ABNORMAL HIGH (ref 3.4–10.8)

## 2020-11-14 LAB — IRON,TIBC AND FERRITIN PANEL
Ferritin: 78 ng/mL (ref 15–150)
Iron Saturation: 54 % (ref 15–55)
Iron: 181 ug/dL — ABNORMAL HIGH (ref 27–139)
Total Iron Binding Capacity: 337 ug/dL (ref 250–450)
UIBC: 156 ug/dL (ref 118–369)

## 2020-11-14 LAB — POTASSIUM: Potassium: 3.9 mmol/L (ref 3.5–5.2)

## 2020-11-16 ENCOUNTER — Telehealth: Payer: Self-pay

## 2020-11-20 ENCOUNTER — Encounter: Payer: Self-pay | Admitting: Pulmonary Disease

## 2020-11-20 ENCOUNTER — Other Ambulatory Visit: Payer: Self-pay

## 2020-11-20 ENCOUNTER — Ambulatory Visit: Payer: Medicare Other | Admitting: Pulmonary Disease

## 2020-11-20 VITALS — BP 122/60 | HR 65 | Temp 97.0°F | Ht 65.0 in | Wt 129.4 lb

## 2020-11-20 DIAGNOSIS — Z8709 Personal history of other diseases of the respiratory system: Secondary | ICD-10-CM | POA: Diagnosis not present

## 2020-11-20 DIAGNOSIS — R0602 Shortness of breath: Secondary | ICD-10-CM | POA: Diagnosis not present

## 2020-11-20 DIAGNOSIS — I059 Rheumatic mitral valve disease, unspecified: Secondary | ICD-10-CM | POA: Diagnosis not present

## 2020-11-20 NOTE — Patient Instructions (Signed)
We are going to get an overnight oxygen level on you  We are going to get another echocardiogram to recheck on your mitral valve  You may need a cardiac catheterization to check the pressure on the artery that goes from the heart to the lungs  We will see you in follow-up in 3 to 4 weeks time with either me or the nurse practitioner, we will however call you with the results of tests as they become available  I do not think that your symptoms are being caused by a lung issue

## 2020-11-20 NOTE — Progress Notes (Signed)
Subjective:    Patient ID: Molly Hodges, female    DOB: 01-30-1932, 87 y.o.   MRN: [id number]  HPI  Molly Hodges is an 87 year old lifelong never smoker who presents for evaluation of dyspnea associated with dizziness.  She is kindly referred by Dr. Lelon Huh.  The patient has recently noted increased issues with dyspnea and episodes that are associated with dizziness and palpitations.  She is having paroxysmal nocturnal dyspnea by her description.  She states that she has had asthma "all of her life" but has really never given her much of a problem.  In the last 6 months however she has noted more dyspnea.  Nebulizer sometimes help but sometimes do not.  She has been on nebulizers less than 1 year.  She notes that when she stays still her dyspnea and dizziness are manageable.  However when she bends over or reaches up or any strenuous movement will trigger the dyspnea.  She states previously she was diagnosed with "COPD" approximately 7 years ago while she was treated for pneumonia in the hospital.  However note is made that she is a lifelong never smoker never had significant secondhand smoke exposure and no occupational history of note.  The patient presents today with her daughter who states that her mother really has not required use of inhalers very much during her lifetime.  The patient does note orthopnea and paroxysmal nocturnal dyspnea as noted above.She also notes lower extremity edema from time to time.  She has a history of status post cardioversion in the past she is on amiodarone and apixaban.  DATA: 05/14/2020 chest x-ray: Independently reviewed : enlargement of the cardiac silhouette.  Interstitial thickening/edema with mild pulmonary vascular congestion.  Straightening of the left cardiac border consistent with mitral stenosis 07/27/2020  2D echo: LVEF 55 to 60%, no wall motion abnormalities on the left ventricle.  Left atrium dilated, moderate pericardial effusion thickened  mitral valve leaflets mild to moderate mitral valve regurgitation, moderate mitral stenosis, right atrial pressure suggested to be 15 mmHg  Review of Systems A 10 point review of systems was performed and it is as noted above otherwise negative.  Past Medical History:  Diagnosis Date  . Asthma   . Carotid artery disease ([place])   . Community acquired pneumonia   . Essential hypertension   . Glaucoma   . Hyperlipidemia   . Mitral regurgitation    a. TTE 08/2015: EF 60-65%, normal wall motion, mild MR, mildly dilated left atrium measuring 40 mm, RVSF normal, PASP normal  . PAF (paroxysmal atrial fibrillation) ([place]) 09/17/2015   a. s/p DCCV 11/15/2015; CHADS2VASc => 4 (HTN, age x 2, female); c. on Eliquis   Past Surgical History:  Procedure Laterality Date  . ABDOMINAL HYSTERECTOMY  1987   due to heavy bleeding  . APPENDECTOMY    . CAROTID PTA/STENT INTERVENTION Right 08/15/2020   Procedure: CAROTID PTA/STENT INTERVENTION;  Surgeon: Algernon Huxley, MD;  Location: [facility];  Service: Cardiovascular;  Laterality: Right;  . CATARACT EXTRACTION    . ELECTROPHYSIOLOGIC STUDY N/A 11/15/2015   Procedure: CARDIOVERSION;  Surgeon: Minna Merritts, MD;  Location: [facility];  Service: Cardiovascular;  Laterality: N/A;   Family History  Problem Relation Age of Onset  . CAD Mother   . CAD Father   . Cancer Son 71       lung cancer   Social History   Tobacco Use  . Smoking status: Never Smoker  . Smokeless  tobacco: Never Used  . Tobacco comment: never   Substance Use Topics  . Alcohol use: No   Allergies  Allergen Reactions  . Shellfish Allergy Anaphylaxis    Other reaction(s): Hallucination  . Azithromycin Other (See Comments)    Extreme burning sensation at IV site Other reaction(s): Unknown  . Tamiflu [Oseltamivir Phosphate] Other (See Comments)    Reaction:  Hallucinations  Reaction:  Hallucinations   . Albuterol Palpitations    Heart racing.    Current Meds   Medication Sig  . albuterol (PROVENTIL) (2.5 MG/3ML) 0.083% nebulizer solution TAKE 3 MLS (2.5 MG TOTAL) BY NEBULIZATION EVERY 4 HOURS AS NEEDED FORUP TO 12 DOSES FOR WHEEZING OR SHORTNESSOF BREATH  . albuterol (VENTOLIN HFA) 108 (90 Base) MCG/ACT inhaler Inhale 2 puffs into the lungs every 6 (six) hours as needed for wheezing or shortness of breath.  Marland Kitchen amiodarone (PACERONE) 200 MG tablet TAKE ONE TABLET EVERY DAY  . apixaban (ELIQUIS) 2.5 MG TABS tablet Take 1 tablet (2.5 mg total) by mouth 2 (two) times daily. (Patient taking differently: Take 1.25 mg by mouth 2 (two) times daily.)  . budesonide-formoterol (SYMBICORT) 160-4.5 MCG/ACT inhaler Inhale 2 puffs into the lungs 2 (two) times daily.  . Calcium Carbonate-Vitamin D 600-400 MG-UNIT tablet Take 1 tablet by mouth daily.  . cholecalciferol (VITAMIN D) 1000 UNITS tablet Take 1,000 Units by mouth daily.  Marland Kitchen diltiazem (CARDIZEM CD) 180 MG 24 hr capsule TAKE 1 CAPSULE EVERY DAY (Patient taking differently: Take 180 mg by mouth daily.)  . dorzolamide (TRUSOPT) 2 % ophthalmic solution Place 1 drop into both eyes 2 (two) times daily.   Marland Kitchen ezetimibe (ZETIA) 10 MG tablet Take 1 tablet (10 mg total) by mouth daily.  Marland Kitchen gabapentin (NEURONTIN) 100 MG capsule Take 1 capsule (100 mg total) by mouth at bedtime.  Marland Kitchen ipratropium-albuterol (DUONEB) 0.5-2.5 (3) MG/3ML SOLN Take 3 mLs by nebulization every 4 (four) hours as needed.  . latanoprost (XALATAN) 0.005 % ophthalmic solution Place 1 drop into both eyes at bedtime.   . levalbuterol (XOPENEX HFA) 45 MCG/ACT inhaler Inhale 2 puffs into the lungs every 4 (four) hours as needed for wheezing.  . magnesium oxide (MAG-OX) 400 MG tablet Take 400 mg by mouth daily.  . Omega-3 Fatty Acids (FISH OIL BURP-LESS) 1000 MG CAPS Take 1,000 mg by mouth daily.   . rosuvastatin (CRESTOR) 20 MG tablet Take 1 tablet (20 mg total) by mouth daily.  . vitamin C (ASCORBIC ACID) 500 MG tablet Take 1,000 mg by mouth daily.  . vitamin  E 100 UNIT capsule Take 200 Units by mouth daily.   Immunization History  Administered Date(s) Administered  . Fluad Quad(high Dose 65+) 05/18/2019, 05/02/2020  . Influenza, High Dose Seasonal PF 08/28/2017, 05/29/2018  . PFIZER(Purple Top)SARS-COV-2 Vaccination 10/13/2019, 11/03/2019, 07/26/2020  . Pneumococcal Conjugate-13 05/30/2014  . Pneumococcal Polysaccharide-23 06/09/1985, 06/15/2006  . Zoster Recombinat (Shingrix) 05/05/2018, 07/20/2018        Objective:   Physical Exam BP 122/60 (BP Location: Left Arm, Cuff Size: Normal)   Pulse 65   Temp (!) 97 F (36.1 C) (Temporal)   Ht 5\' 5"  (1.651 m)   Wt 129 lb 6.4 oz (58.7 kg)   SpO2 99%   BMI 21.53 kg/m  GENERAL: Thin, well-developed elderly woman in no respiratory distress.  She presents in transport chair.  No conversational dyspnea. HEAD: Normocephalic, atraumatic.  EYES: Pupils equal, round, reactive to light.  No scleral icterus.  MOUTH: Nose/mouth/throat not examined due  to masking requirements for COVID 19. NECK: Supple. No thyromegaly. Trachea midline. No JVD.  No adenopathy.  Right neck well-healed surgical scar.  PULMONARY: Good air entry bilaterally.  No adventitious sounds. CARDIOVASCULAR: S1 and S2. Regular rate and rhythm.  Mitral midsystolic click noted.  No murmurs. ABDOMEN: Benign. MUSCULOSKELETAL: No joint deformity, no clubbing, no edema.  NEUROLOGIC: No focal deficit, no gait disturbance, speech is fluent. SKIN: Intact,warm,dry.  No rashes noted PSYCH: Mood and behavior normal  Ambulatory oximetry: Patient maintained oxygen saturations at 98% throughout the study.     Assessment & Plan:     ICD-10-CM   1. Shortness of breath  R06.02    Cannot ascribe her shortness of breath to pulmonary disease Suspect worsening mitral disease, query pulmonary hypertension 2D echo May need cardiac cath  2. Mitral valve disease  I05.9 Pulse oximetry, overnight    ECHOCARDIOGRAM COMPLETE   2D echo May need cath  as above  3. History of asthma  Z87.09    By history, no help from inhalers/nebulizers lately THIS PATIENT DOES NOT HAVE COPD We will schedule PFTs once her cardiac issues are cleared   Orders Placed This Encounter  Procedures  . Pulse oximetry, overnight    On room air.  DME : new start    Standing Status:   Future    Standing Expiration Date:   11/20/2021  . ECHOCARDIOGRAM COMPLETE    Standing Status:   Future    Standing Expiration Date:   05/22/2021    Order Specific Question:   Where should this test be performed    Answer:   [facility]    Order Specific Question:   Perflutren DEFINITY (image enhancing agent) should be administered unless hypersensitivity or allergy exist    Answer:   Administer Perflutren    Order Specific Question:   Reason for exam-Echo    Answer:   Dyspnea  R06.00   Discussion:  I suspect the patient has issues with worsening mitral disease.  In particular mitral stenosis.  She may be developing pulmonary hypertension due to the same.  She may benefit from right and left heart caths.  Will start with repeating 2D echo to reevaluate this issue.  Her symptoms are not consistent with pulmonary disease.  They are episodic and associated with dizziness and palpitations.  Her lung exam was benign.  Ambulatory oximetry failed to show any O2 desaturation she maintained at 98% throughout the whole study.  The diagnosis of COPD is erroneous on this patient.  She has never smoked has never had exposure to secondhand smoke and no occupational history.  Currently she should not undergo pulmonary function testing due to the high risk for syncope during the PFT maneuvers.  She will need to be cleared from the cardiac standpoint prior to performing these we will see this patient in 3 to 4 weeks time with either me or the nurse practitioner she is to contact us prior to that time should any new difficulties arise.  I will discuss with Dr. Rockey Situ the possibility of cardiac  catheterization in this patient.  Renold Don, MD [facility]   *This note was dictated using voice recognition software/Dragon.  Despite best efforts to proofread, errors can occur which can change the meaning.  Any change was purely unintentional.

## 2020-11-21 ENCOUNTER — Telehealth: Payer: Self-pay | Admitting: Pulmonary Disease

## 2020-11-21 NOTE — Telephone Encounter (Signed)
[name] was ordered on 11/20/2020. Patient stated that [facility] has not contacted her to schedule [name]. Patient is aware that it may take a few days for [facility] to process the order.  Advised patient to call back in 1 week if she has not been contacted by [facility].  Nothing further needed at this time.

## 2020-11-22 NOTE — Telephone Encounter (Signed)
Error

## 2020-11-26 NOTE — Telephone Encounter (Signed)
Anita, can you help with this? 

## 2020-11-26 NOTE — Telephone Encounter (Signed)
Pt's daughter said they said it would be out in 2-3 days last week and it still hasn't been brought out.

## 2020-11-26 NOTE — Telephone Encounter (Signed)
I called [facility] and spoke with [place] and Estill Bamberg.  They were short staffed in the office last week and the [name] order had not been pulled until [place] did this morning.  I have spoke with Mrs. Gau and she is aware that it should be delivered this week

## 2020-11-29 ENCOUNTER — Other Ambulatory Visit (INDEPENDENT_AMBULATORY_CARE_PROVIDER_SITE_OTHER): Payer: Self-pay | Admitting: Nurse Practitioner

## 2020-11-29 DIAGNOSIS — I779 Disorder of arteries and arterioles, unspecified: Secondary | ICD-10-CM

## 2020-11-30 ENCOUNTER — Other Ambulatory Visit (INDEPENDENT_AMBULATORY_CARE_PROVIDER_SITE_OTHER): Payer: Self-pay | Admitting: Vascular Surgery

## 2020-12-03 ENCOUNTER — Other Ambulatory Visit: Payer: Self-pay

## 2020-12-03 MED ORDER — PREGABALIN 50 MG PO CAPS: 50.0000 mg | ORAL_CAPSULE | Freq: Two times a day (BID) | ORAL | 1 refills | Status: DC

## 2020-12-03 NOTE — Telephone Encounter (Signed)
Can try change to Lyrica 50mg  one tablet twice a day, #60, rf x 1

## 2020-12-03 NOTE — Telephone Encounter (Signed)
Pt advised. Please send to [facility].

## 2020-12-03 NOTE — Telephone Encounter (Signed)
Copied from [place] [phone]. Topic: General - Inquiry >> Dec 03, 2020 10:44 AM Greggory Keen D wrote: Reason for CRM: Pt is about to run out of her gabapentin and wants to know what Dr. Caryn Section wants to use.  He had mentioned he wanted to take her off of it and try something else.  CC   [phone]

## 2020-12-04 ENCOUNTER — Ambulatory Visit (INDEPENDENT_AMBULATORY_CARE_PROVIDER_SITE_OTHER): Payer: Medicare Other

## 2020-12-04 ENCOUNTER — Ambulatory Visit (INDEPENDENT_AMBULATORY_CARE_PROVIDER_SITE_OTHER): Payer: Medicare Other | Admitting: Vascular Surgery

## 2020-12-04 ENCOUNTER — Other Ambulatory Visit: Payer: Self-pay

## 2020-12-04 ENCOUNTER — Encounter (INDEPENDENT_AMBULATORY_CARE_PROVIDER_SITE_OTHER): Payer: Self-pay | Admitting: Vascular Surgery

## 2020-12-04 VITALS — BP 149/74 | HR 67 | Ht 65.0 in | Wt 126.0 lb

## 2020-12-04 DIAGNOSIS — I779 Disorder of arteries and arterioles, unspecified: Secondary | ICD-10-CM

## 2020-12-04 DIAGNOSIS — I10 Essential (primary) hypertension: Secondary | ICD-10-CM | POA: Diagnosis not present

## 2020-12-04 DIAGNOSIS — E041 Nontoxic single thyroid nodule: Secondary | ICD-10-CM | POA: Diagnosis not present

## 2020-12-04 DIAGNOSIS — I6523 Occlusion and stenosis of bilateral carotid arteries: Secondary | ICD-10-CM

## 2020-12-04 DIAGNOSIS — I480 Paroxysmal atrial fibrillation: Secondary | ICD-10-CM | POA: Diagnosis not present

## 2020-12-04 DIAGNOSIS — G629 Polyneuropathy, unspecified: Secondary | ICD-10-CM

## 2020-12-04 NOTE — Progress Notes (Signed)
MRN : [id number]  Molly Hodges is a 87 y.o. (11/12/1931) female who presents with chief complaint of  Chief Complaint  Patient presents with  . Follow-up  . Carotid    60M carotid  .  History of Present Illness: Patient returns in follow-up of her carotid disease.  She is about 21-month status post right carotid artery stent placement for high-grade stenosis.  She had no focal neurologic deficits or complications after the procedure.  She has no new focal neurologic deficits or symptoms.  She is still bothered by significant dizziness and neuropathy.  This did not really improve with her carotid treatment.  Duplex today show peak systolic velocities of less than 150 cm/s in the right carotid artery stent which would demonstrate patency.  This would fall in the 40 to 59% range without stent placement, but with stent placement this is relatively typical.  Her left carotid artery velocities are in the 40 to 59% range and stable.  Current Outpatient Medications  Medication Sig Dispense Refill  . albuterol (PROVENTIL) (2.5 MG/58ML) 0.083% nebulizer solution TAKE 3 MLS (2.5 MG TOTAL) BY NEBULIZATION EVERY 4 HOURS AS NEEDED FORUP TO 12 DOSES FOR WHEEZING OR SHORTNESSOF BREATH 75 mL 5  . albuterol (VENTOLIN HFA) 108 (90 Base) MCG/ACT inhaler Inhale 2 puffs into the lungs every 6 (six) hours as needed for wheezing or shortness of breath. 8 g 2  . amiodarone (PACERONE) 200 MG tablet TAKE ONE TABLET EVERY DAY 90 tablet 0  . apixaban (ELIQUIS) 2.5 MG TABS tablet Take 1 tablet (2.5 mg total) by mouth 2 (two) times daily. (Patient taking differently: Take 1.25 mg by mouth 2 (two) times daily.) 180 tablet 3  . aspirin EC 81 MG EC tablet Take 1 tablet (81 mg total) by mouth daily at 6 (six) AM. Swallow whole. 30 tablet 11  . budesonide-formoterol (SYMBICORT) 160-4.5 MCG/ACT inhaler Inhale 2 puffs into the lungs 2 (two) times daily. 1 Inhaler 12  . Calcium Carbonate-Vitamin D 600-400 MG-UNIT tablet Take 1  tablet by mouth daily.    . cholecalciferol (VITAMIN D) 1000 UNITS tablet Take 1,000 Units by mouth daily.    . clopidogrel (PLAVIX) 75 MG tablet TAKE 1 TABLET BY MOUTH DAILY AT 6AM AS DIRECTED 30 tablet 2  . diltiazem (CARDIZEM CD) 180 MG 24 hr capsule TAKE 1 CAPSULE EVERY DAY (Patient taking differently: Take 180 mg by mouth daily.) 30 capsule 11  . dorzolamide (TRUSOPT) 2 % ophthalmic solution Place 1 drop into both eyes 2 (two) times daily.     Marland Kitchen ezetimibe (ZETIA) 10 MG tablet Take 1 tablet (10 mg total) by mouth daily. 90 tablet 3  . hydrochlorothiazide (MICROZIDE) 12.5 MG capsule Take 12.5 mg by mouth daily.    Marland Kitchen ipratropium-albuterol (DUONEB) 0.5-2.5 (3) MG/58ML SOLN Take 3 mLs by nebulization every 4 (four) hours as needed. 360 mL 1  . latanoprost (XALATAN) 0.005 % ophthalmic solution Place 1 drop into both eyes at bedtime.     . levalbuterol (XOPENEX HFA) 45 MCG/ACT inhaler Inhale 2 puffs into the lungs every 4 (four) hours as needed for wheezing. 1 Inhaler 2  . magnesium oxide (MAG-OX) 400 MG tablet Take 400 mg by mouth daily.    . Omega-3 Fatty Acids (FISH OIL BURP-LESS) 1000 MG CAPS Take 1,000 mg by mouth daily.     . pregabalin (LYRICA) 50 MG capsule Take 1 capsule (50 mg total) by mouth 2 (two) times daily. 60 capsule 1  .  rosuvastatin (CRESTOR) 20 MG tablet Take 1 tablet (20 mg total) by mouth daily. 90 tablet 3  . vitamin C (ASCORBIC ACID) 500 MG tablet Take 1,000 mg by mouth daily.    . vitamin E 100 UNIT capsule Take 200 Units by mouth daily.     No current facility-administered medications for this visit.    Past Medical History:  Diagnosis Date  . Asthma   . Carotid artery disease ([place])   . Community acquired pneumonia   . Essential hypertension   . Glaucoma   . Hyperlipidemia   . Mitral regurgitation    a. TTE 08/2015: EF 60-65%, normal wall motion, mild MR, mildly dilated left atrium measuring 40 mm, RVSF normal, PASP normal  . PAF (paroxysmal atrial fibrillation)  ([place]) 09/17/2015   a. s/p DCCV 11/15/2015; CHADS2VASc => 4 (HTN, age x 2, female); c. on Eliquis    Past Surgical History:  Procedure Laterality Date  . ABDOMINAL HYSTERECTOMY  1987   due to heavy bleeding  . APPENDECTOMY    . CAROTID PTA/STENT INTERVENTION Right 08/15/2020   Procedure: CAROTID PTA/STENT INTERVENTION;  Surgeon: Algernon Huxley, MD;  Location: [place] [facility];  Service: Cardiovascular;  Laterality: Right;  . CATARACT EXTRACTION    . ELECTROPHYSIOLOGIC STUDY N/A 11/15/2015   Procedure: CARDIOVERSION;  Surgeon: Minna Merritts, MD;  Location: [facility];  Service: Cardiovascular;  Laterality: N/A;     Social History   Tobacco Use  . Smoking status: Never Smoker  . Smokeless tobacco: Never Used  . Tobacco comment: never   Vaping Use  . Vaping Use: Never used  Substance Use Topics  . Alcohol use: No  . Drug use: Never      Family History  Problem Relation Age of Onset  . CAD Mother   . CAD Father   . Cancer Son 26       lung cancer     Allergies  Allergen Reactions  . Shellfish Allergy Anaphylaxis    Other reaction(s): Hallucination  . Azithromycin Other (See Comments)    Extreme burning sensation at IV site Other reaction(s): Unknown  . Tamiflu [Oseltamivir Phosphate] Other (See Comments)    Reaction:  Hallucinations  Reaction:  Hallucinations   . Albuterol Palpitations    Heart racing.     REVIEW OF SYSTEMS(Negative unless checked)  Constitutional: [] ??Weight loss [] ??Fever [] ??Chills Cardiac: [] ??Chest pain [] ??Chest pressure [] ??Palpitations [] ??Shortness of breath when laying flat [] ??Shortness of breath at rest [] ??Shortness of breath with exertion. Vascular: [] ??Pain in legs with walking [] ??Pain in legs at rest [] ??Pain in legs when laying flat [] ??Claudication [] ??Pain in feet when walking [] ??Pain in feet at rest [] ??Pain in feet when laying flat [] ??History of DVT [] ??Phlebitis [] ??Swelling in legs  [] ??Varicose veins [] ??Non-healing ulcers Pulmonary: [] ??Uses home oxygen [] ??Productive cough [] ??Hemoptysis [] ??Wheeze [] ??COPD [] ??Asthma Neurologic: [x] ??Dizziness [] ??Blackouts [] ??Seizures [] ??History of stroke [] ??History of TIA [] ??Aphasia [] ??Temporary blindness [] ??Dysphagia [] ??Weakness or numbness in arms [] ??Weakness or numbness in legs Musculoskeletal: [x] ??Arthritis [] ??Joint swelling [x] ??Joint pain [] ??Low back pain Hematologic: [] ??Easy bruising [] ??Easy bleeding [] ??Hypercoagulable state [] ??Anemic [] ??Hepatitis Gastrointestinal: [] ??Blood in stool [] ??Vomiting blood [] ??Gastroesophageal reflux/heartburn [] ??Abdominal pain Genitourinary: [] ??Chronic kidney disease [] ??Difficult urination [] ??Frequent urination [] ??Burning with urination [] ??Hematuria Skin: [] ??Rashes [] ??Ulcers [] ??Wounds Psychological: [] ??History of anxiety [] ??History of major depression.   Physical Examination  Vitals:   12/04/20 1117  BP: (!) 149/74  Pulse: 67  Weight: 126 lb (57.2 kg)  Height: 5\' 5"  (1.651 m)   Body mass index is 20.97 kg/m. Gen:  WD/WN, NAD.  Appears younger than stated age Head: [place]/AT, No temporalis wasting. Ear/Nose/Throat: Hearing grossly intact, nares w/o erythema or drainage, trachea midline Eyes: Conjunctiva clear. Sclera non-icteric Neck: Supple.  No bruit  Pulmonary:  Good air movement, equal and clear to auscultation bilaterally.  Cardiac: RRR, No JVD Vascular:  Vessel Right Left  Radial Palpable Palpable           Musculoskeletal: M/S 5/5 throughout.  No deformity or atrophy.  No edema. Neurologic: CN 2-12 intact. Sensation grossly intact in extremities.  Symmetrical.  Speech is fluent. Motor exam as listed above. Psychiatric: Judgment intact, Mood & affect appropriate for pt's clinical situation. Dermatologic: No rashes or ulcers noted.  No cellulitis or open wounds.      CBC Lab  Results  Component Value Date   WBC 12.0 (H) 11/13/2020   HGB 11.5 11/13/2020   HCT 37.4 11/13/2020   MCV 75 (L) 11/13/2020   PLT 299 11/13/2020    BMET    Component Value Date/Time   NA 143 09/17/2020 1051   NA 143 05/10/2012 1100   K 3.9 11/13/2020 1118   K 3.7 05/10/2012 1100   CL 106 09/17/2020 1051   CL 104 05/10/2012 1100   CO2 24 09/17/2020 1051   CO2 29 05/10/2012 1100   GLUCOSE 89 09/17/2020 1051   GLUCOSE 170 (H) 08/16/2020 0629   GLUCOSE 106 (H) 05/10/2012 1100   BUN 17 09/17/2020 1051   BUN 17 05/10/2012 1100   CREATININE 1.03 (H) 09/17/2020 1051   CREATININE 0.99 05/10/2012 1100   CALCIUM 9.0 09/17/2020 1051   CALCIUM 10.2 (H) 05/10/2012 1100   GFRNONAA 49 (L) 09/17/2020 1051   GFRNONAA >60 08/16/2020 0629   GFRNONAA 55 (L) 05/10/2012 1100   GFRAA 57 (L) 09/17/2020 1051   GFRAA >60 05/10/2012 1100   CrCl cannot be calculated (Patient's most recent lab result is older than the maximum 21 days allowed.).  COAG Lab Results  Component Value Date   INR 1.28 11/09/2015    Radiology No results found.   Assessment/Plan Hypertension blood pressure control important in reducing the progression of atherosclerotic disease. On appropriate oral medications.   Peripheral neuropathy ([place]) Scheduled to see a neurologist in the near future and she is also going to discuss her dizziness at that time.  Cyst of thyroid Recently evaluated and felt to be benign.  Paroxysmal atrial fibrillation (HCC) Rate controlled and on anticoagulation  Carotid stenosis Duplex today show peak systolic velocities of less than 150 cm/s in the right carotid artery stent which would demonstrate patency.  This would fall in the 40 to 59% range without stent placement, but with stent placement this is relatively typical.  Her left carotid artery velocities are in the 40 to 59% range and stable. Doing well.  We can stretch out her follow-up to 6 months.  If she is going to be on  Eliquis, we are far enough out that we can stop the Plavix as well.    Leotis Pain, MD  12/04/2020 12:17 PM    This note was created with Dragon medical transcription system.  Any errors from dictation are purely unintentional

## 2020-12-04 NOTE — Assessment & Plan Note (Signed)
Duplex today show peak systolic velocities of less than 150 cm/s in the right carotid artery stent which would demonstrate patency.  This would fall in the 40 to 59% range without stent placement, but with stent placement this is relatively typical.  Her left carotid artery velocities are in the 40 to 59% range and stable. Doing well.  We can stretch out her follow-up to 6 months.  If she is going to be on Eliquis, we are far enough out that we can stop the Plavix as well.

## 2020-12-07 DIAGNOSIS — I349 Nonrheumatic mitral valve disorder, unspecified: Secondary | ICD-10-CM | POA: Diagnosis not present

## 2020-12-07 DIAGNOSIS — R42 Dizziness and giddiness: Secondary | ICD-10-CM | POA: Diagnosis not present

## 2020-12-11 ENCOUNTER — Telehealth: Payer: Self-pay | Admitting: Pulmonary Disease

## 2020-12-11 DIAGNOSIS — G4734 Idiopathic sleep related nonobstructive alveolar hypoventilation: Secondary | ICD-10-CM

## 2020-12-11 NOTE — Telephone Encounter (Signed)
[name] reviewed by Dr. Patsey Berthold recommend 1.5L QHS.  Possible pulmonary HTN.  Lm for patient.

## 2020-12-12 ENCOUNTER — Telehealth: Payer: Self-pay | Admitting: Family Medicine

## 2020-12-12 NOTE — Telephone Encounter (Signed)
This patient had Zio monitor placed on 3-18. It was supposed to be for two weeks, but I still haven't gotten a report. Can you please call Zio and see when we should be getting report.

## 2020-12-12 NOTE — Telephone Encounter (Signed)
I called Zio and was told how to access results on [employer].

## 2020-12-12 NOTE — Telephone Encounter (Signed)
Patient is aware of results and voiced her understanding. Order has been placed to [facility] for 1.5L QHS, as patient is agreeable.  Nothing further needed at this time.

## 2020-12-13 ENCOUNTER — Other Ambulatory Visit: Payer: Self-pay

## 2020-12-13 ENCOUNTER — Ambulatory Visit (INDEPENDENT_AMBULATORY_CARE_PROVIDER_SITE_OTHER): Payer: Medicare Other

## 2020-12-13 DIAGNOSIS — I059 Rheumatic mitral valve disease, unspecified: Secondary | ICD-10-CM | POA: Diagnosis not present

## 2020-12-13 LAB — ECHOCARDIOGRAM COMPLETE
AR max vel: 1.89 cm2
AV Area VTI: 2.26 cm2
AV Area mean vel: 2.09 cm2
AV Mean grad: 2 mmHg
AV Peak grad: 6 mmHg
Ao pk vel: 1.22 m/s
Area-P 1/2: 1.81 cm2
Calc EF: 69.1 %
MV VTI: 0.67 cm2
P 1/2 time: 372 msec
S' Lateral: 3 cm
Single Plane A2C EF: 64.4 %
Single Plane A4C EF: 73.3 %

## 2020-12-17 NOTE — Progress Notes (Signed)
Cardiology Office Note  Date:  12/18/2020   ID:  Molly, Hodges May 10, 1932, MRN [id number]  PCP:  Birdie Sons, MD   Chief Complaint  Patient presents with  . Follow-up    Echo results. Patient c/o chest discomfort in chest, blood pressure fluctuating, lightheaded, shortness of breath and weakness Medications reviewed by the patient verbally.     HPI:  Molly Hodges is a 87 y.o. female with  Paroxysmal Afib,  (CHADS2VASc of 4 - HTN, age x 2, female)  COPD, (never smoked) asthma, prior episodes of PNA,   hospital admission from 1/30 to 09/21/15,  HTN  Mitral valve stenosis, moderate December 2021 Pericardial effusion mild to moderate December 2021 PAD, carotid stenosis, high-grade on the right Chronic dizziness 05/2020 started, last couple of weeks at a time, seen by ENT/neurology who presents Follow-up for her atrial fibrillation,Previous cardioversion, carotid stenosis  Last seen by myself in clinic December 2021  "needs oxygen at night", wait ing for delivery Rare chest pain on left Chronic dizziness, lasting weeks at a time,  Carotid stenosis on right, stent  Continues to have dizziness, chronic issue seems to wax and wane, unclear etiology  Could not do PT/vestibular training, could not do it, legs are too weak No regular walking program, no exercise  EKG personally reviewed by myself on todays visit Shows normal sinus rhythm with rate 65 bpm no significant ST or T-wave changes  Echocardiogram July 25, 2020  EF 55 to 60%, mildly dilated left atrium, mild to moderate circumferential pericardial effusion, Mild to moderate mitral valve regurgitation moderate mitral valve stenosis gradient 10 mmHg Dilated IVC  Seen by Dr. Lucky Cowboy, episodes of dizziness High-grade right carotid artery stenosis estimated 80 to 99%   Other past medical history reviewed found to be in new onset Afib with RVR on 09/17/15 upon her arrival to [facility], pneumonia started on  Eliquis given her stroke risk (CHADS2VASc of 4 - HTN, age x 2, female) and rate controlled.  Beta blockers were avoided given her asthma. Started on thousand channel blocker    PMH:   has a past medical history of Asthma, Carotid artery disease ([place]), Community acquired pneumonia, Essential hypertension, Glaucoma, Hyperlipidemia, Mitral regurgitation, and PAF (paroxysmal atrial fibrillation) ([place]) (09/17/2015).  PSH:    Past Surgical History:  Procedure Laterality Date  . ABDOMINAL HYSTERECTOMY  1987   due to heavy bleeding  . APPENDECTOMY    . CAROTID PTA/STENT INTERVENTION Right 08/15/2020   Procedure: CAROTID PTA/STENT INTERVENTION;  Surgeon: Algernon Huxley, MD;  Location: [place] [facility];  Service: Cardiovascular;  Laterality: Right;  . CATARACT EXTRACTION    . ELECTROPHYSIOLOGIC STUDY N/A 11/15/2015   Procedure: CARDIOVERSION;  Surgeon: Minna Merritts, MD;  Location: [facility] OR[facility];  Service: Cardiovascular;  Laterality: N/A;    Current Outpatient Medications  Medication Sig Dispense Refill  . albuterol (PROVENTIL) (2.5 MG/3ML) 0.083% nebulizer solution TAKE 3 MLS (2.5 MG TOTAL) BY NEBULIZATION EVERY 4 HOURS AS NEEDED FORUP TO 12 DOSES FOR WHEEZING OR SHORTNESSOF BREATH 75 mL 5  . albuterol (VENTOLIN HFA) 108 (90 Base) MCG/ACT inhaler Inhale 2 puffs into the lungs every 6 (six) hours as needed for wheezing or shortness of breath. 8 g 2  . amiodarone (PACERONE) 200 MG tablet TAKE ONE TABLET EVERY DAY 90 tablet 0  . apixaban (ELIQUIS) 2.5 MG TABS tablet Take 1.25 mg by mouth 2 (two) times daily.    . budesonide-formoterol (SYMBICORT) 160-4.5 MCG/ACT inhaler Inhale 2  puffs into the lungs 2 (two) times daily. 1 Inhaler 12  . Calcium Carbonate-Vitamin D 600-400 MG-UNIT tablet Take 1 tablet by mouth daily.    . cholecalciferol (VITAMIN D) 1000 UNITS tablet Take 1,000 Units by mouth daily.    Marland Kitchen diltiazem (CARDIZEM CD) 180 MG 24 hr capsule TAKE 1 CAPSULE EVERY DAY 30 capsule 11  .  dorzolamide (TRUSOPT) 2 % ophthalmic solution Place 1 drop into both eyes 2 (two) times daily.     Marland Kitchen ezetimibe (ZETIA) 10 MG tablet Take 1 tablet (10 mg total) by mouth daily. 90 tablet 3  . hydrochlorothiazide (MICROZIDE) 12.5 MG capsule Take 12.5 mg by mouth daily.    Marland Kitchen ipratropium-albuterol (DUONEB) 0.5-2.5 (3) MG/3ML SOLN Take 3 mLs by nebulization every 4 (four) hours as needed. 360 mL 1  . latanoprost (XALATAN) 0.005 % ophthalmic solution Place 1 drop into both eyes at bedtime.     . levalbuterol (XOPENEX HFA) 45 MCG/ACT inhaler Inhale 2 puffs into the lungs every 4 (four) hours as needed for wheezing. 1 Inhaler 2  . magnesium oxide (MAG-OX) 400 MG tablet Take 400 mg by mouth daily.    . Omega-3 Fatty Acids (FISH OIL BURP-LESS) 1000 MG CAPS Take 1,000 mg by mouth daily.     . potassium chloride (KLOR-CON) 10 MEQ tablet Take 10 mEq by mouth daily.    . pregabalin (LYRICA) 50 MG capsule Take 1 capsule (50 mg total) by mouth 2 (two) times daily. 60 capsule 1  . rosuvastatin (CRESTOR) 20 MG tablet Take 1 tablet (20 mg total) by mouth daily. 90 tablet 3  . vitamin C (ASCORBIC ACID) 500 MG tablet Take 1,000 mg by mouth daily.    . vitamin E 100 UNIT capsule Take 200 Units by mouth daily.     No current facility-administered medications for this visit.     Allergies:   Shellfish allergy, Azithromycin, Tamiflu [oseltamivir phosphate], and Albuterol   Social History:  The patient  reports that she has never smoked. She has never used smokeless tobacco. She reports that she does not drink alcohol and does not use drugs.   Family History:   family history includes CAD in her father and mother; Cancer (age of onset: 55) in her son.    Review of Systems: Review of Systems  Constitutional: Negative.   Respiratory: Negative.   Cardiovascular: Negative.   Gastrointestinal: Negative.   Musculoskeletal: Negative.   Neurological: Negative.   Psychiatric/Behavioral: Negative.   All other systems  reviewed and are negative.   PHYSICAL EXAM: VS:  BP (!) 120/56 (BP Location: Left Arm, Patient Position: Sitting, Cuff Size: Normal)   Pulse 65   Ht 5\' 5"  (1.651 m)   Wt 125 lb 6 oz (56.9 kg)   SpO2 98%   BMI 20.86 kg/m  , BMI Body mass index is 20.86 kg/m. Constitutional:  oriented to person, place, and time. No distress.  HENT:  Head: Grossly normal Eyes:  no discharge. No scleral icterus.  Neck: No JVD, no bruit  Cardiovascular: Regular rate and rhythm, no murmurs appreciated Pulmonary/Chest: Clear to auscultation bilaterally, no wheezes or rails Abdominal: Soft.  no distension.  no tenderness.  Musculoskeletal: Normal range of motion Neurological:  normal muscle tone. Coordination normal. No atrophy Skin: Skin warm and dry Psychiatric: normal affect, pleasant   Recent Labs: 07/03/2020: TSH 3.590 09/17/2020: ALT 44; BUN 17; Creatinine, Ser 1.03; Sodium 143 11/13/2020: Hemoglobin 11.5; Platelets 299; Potassium 3.9    Lipid Panel Lab  Results  Component Value Date   CHOL 119 09/17/2020   HDL 66 09/17/2020   LDLCALC 40 09/17/2020   TRIG 58 09/17/2020      Wt Readings from Last 3 Encounters:  12/18/20 125 lb 6 oz (56.9 kg)  12/04/20 126 lb (57.2 kg)  11/20/20 129 lb 6.4 oz (58.7 kg)     ASSESSMENT AND PLAN:  Paroxysmal atrial fibrillation (HCC) - Plan: EKG 12-Lead on Eliquis, for unclear reasons is taking 1.25 mg twice daily, was told from primary care to take that low-dose Recommend she go back to Eliquis 2.5 twice daily -Reports having a ZIO monitor from primary care, this has not been uploaded  PAD/carotid stenosis Prior stenting, no change in her dizziness following procedure  Essential hypertension Blood pressure is well controlled on today's visit. No changes made to the medications.  Chronic dizziness Has symptoms while sitting, also with standing Does not appear to be a cardiac issue, Legs very weak with standing, unable to even walk toe to toe with  vestibular PT, legs give out Would benefit from continued general PT, strengthening exercises  Mitral valve stenosis Images pulled up and reviewed with her, moderate in nature, She has declined TEE at this time, she is not a surgical candidate given severity of disease is not severe, no pulmonary hypertension -We will continue to watch with periodic echocardiogram No change in mitral valve from December 21 to April 2022 Gradient 10 to 11 mmHg placing it in moderate range  Centrilobular emphysema (HCC) Stable  Hyperlipidemia Cholesterol is at goal on the current lipid regimen. No changes to the medications were made.    Total encounter time more than 35 minutes  Greater than 50% was spent in counseling and coordination of care with the patient     No orders of the defined types were placed in this encounter.    Signed, Esmond Plants, M.D., Ph.D. 12/18/2020  [facility] [place], [place] [phone]

## 2020-12-18 ENCOUNTER — Telehealth: Payer: Self-pay | Admitting: Pulmonary Disease

## 2020-12-18 ENCOUNTER — Ambulatory Visit: Payer: Medicare Other | Admitting: Cardiovascular Disease

## 2020-12-18 ENCOUNTER — Other Ambulatory Visit: Payer: Self-pay

## 2020-12-18 ENCOUNTER — Encounter: Payer: Self-pay | Admitting: Cardiovascular Disease

## 2020-12-18 VITALS — BP 120/56 | HR 65 | Ht 65.0 in | Wt 125.4 lb

## 2020-12-18 DIAGNOSIS — I6521 Occlusion and stenosis of right carotid artery: Secondary | ICD-10-CM

## 2020-12-18 DIAGNOSIS — I739 Peripheral vascular disease, unspecified: Secondary | ICD-10-CM

## 2020-12-18 DIAGNOSIS — I480 Paroxysmal atrial fibrillation: Secondary | ICD-10-CM | POA: Diagnosis not present

## 2020-12-18 DIAGNOSIS — J432 Centrilobular emphysema: Secondary | ICD-10-CM | POA: Diagnosis not present

## 2020-12-18 DIAGNOSIS — I340 Nonrheumatic mitral (valve) insufficiency: Secondary | ICD-10-CM

## 2020-12-18 NOTE — Patient Instructions (Addendum)
Zio monitor from Dr. Caryn Section, we will get the report uploaded to your chart for future review  Medication Instructions:   Eliquis 2.5 twice a day,   DO NOT cut pills in half  Free samples (5 boxes)  Lot: [id number]  Exp: 08/2021  If you need a refill on your cardiac medications before your next appointment, please call your pharmacy.    Lab work: No new labs needed  Testing/Procedures: No new testing needed   Follow-Up:  . You will need a follow up appointment in 6 months  . Providers on your designated Care Team:   . Murray Hodgkins, NP . Christell Faith, PA-C . Marrianne Mood, PA-C   COVID-19 Vaccine Information can be found at: ShippingScam.co.uk For questions related to vaccine distribution or appointments, please email [email or web address] or call [phone].

## 2020-12-18 NOTE — Telephone Encounter (Signed)
Order placed to [facility] on 12/12/2020. [facility] has not contact patient.   Rodena Piety, please advise. Thanks

## 2020-12-18 NOTE — Telephone Encounter (Signed)
I tried calling [facility] and was on hold for 8 minutes and had to leave a message with Roselyn Reef he just works in the call center and would have someone to call me back. I told him that I received confirmation from Melanee Left on 12/13/20 that she had the order. Today I received confirmation from Jaynee Eagles that she has the 02 order

## 2020-12-19 DIAGNOSIS — R0902 Hypoxemia: Secondary | ICD-10-CM | POA: Diagnosis not present

## 2020-12-19 NOTE — Telephone Encounter (Signed)
I called [facility] back again today and spoke with  Molly Hodges. Someone in the office actually had Molly Hodges on the other phone. Per Molly Hodges the insurance required some type of payment before the 02 could be delivered. Molly Hodges was taking care of the payment today so the 02 could be delivered today. I call and spoke with the daughter Molly Hodges and she is aware of the hold up and that her Mom should have her 43 tonight

## 2020-12-19 NOTE — Telephone Encounter (Signed)
Anita, please advise. thanks 

## 2020-12-19 NOTE — Telephone Encounter (Signed)
Note I called [facility] back again today and spoke with  Tiffany. Someone in the office actually had Molly Hodges on the other phone. Per Tiffany the insurance required some type of payment before the 02 could be delivered. Mrs. Heaps was taking care of the payment today so the 02 could be delivered today. I call and spoke with the daughter [name] and she is aware of the hold up and that her Mom should have her 68 tonight     Dec 18, 2020  Me   AW  12:25 PM Note I tried calling [facility] and was on hold for 8 minutes and had to leave a message with Roselyn Reef he just works in the call center and would have someone to call me back. I told him that I received confirmation from Melanee Left on 12/13/20 that she had the order. Today I received confirmation from Jaynee Eagles that she has the 02 order

## 2020-12-19 NOTE — Telephone Encounter (Signed)
Noted by triage.  

## 2020-12-24 NOTE — Addendum Note (Signed)
Addended by: Anselm Pancoast on: 12/24/2020 01:11 PM   Modules accepted: Orders

## 2020-12-31 DIAGNOSIS — I480 Paroxysmal atrial fibrillation: Secondary | ICD-10-CM | POA: Diagnosis not present

## 2020-12-31 DIAGNOSIS — R202 Paresthesia of skin: Secondary | ICD-10-CM | POA: Diagnosis not present

## 2020-12-31 DIAGNOSIS — R42 Dizziness and giddiness: Secondary | ICD-10-CM | POA: Diagnosis not present

## 2020-12-31 DIAGNOSIS — R200 Anesthesia of skin: Secondary | ICD-10-CM | POA: Diagnosis not present

## 2021-01-01 ENCOUNTER — Other Ambulatory Visit: Payer: Self-pay

## 2021-01-01 ENCOUNTER — Ambulatory Visit: Payer: Medicare Other | Admitting: Adult Health

## 2021-01-01 ENCOUNTER — Encounter: Payer: Self-pay | Admitting: Adult Health

## 2021-01-01 DIAGNOSIS — J4520 Mild intermittent asthma, uncomplicated: Secondary | ICD-10-CM | POA: Diagnosis not present

## 2021-01-01 DIAGNOSIS — R0600 Dyspnea, unspecified: Secondary | ICD-10-CM | POA: Insufficient documentation

## 2021-01-01 DIAGNOSIS — J9611 Chronic respiratory failure with hypoxia: Secondary | ICD-10-CM

## 2021-01-01 DIAGNOSIS — R0602 Shortness of breath: Secondary | ICD-10-CM | POA: Diagnosis not present

## 2021-01-01 DIAGNOSIS — R42 Dizziness and giddiness: Secondary | ICD-10-CM | POA: Diagnosis not present

## 2021-01-01 DIAGNOSIS — Z9981 Dependence on supplemental oxygen: Secondary | ICD-10-CM | POA: Diagnosis not present

## 2021-01-01 DIAGNOSIS — R2681 Unsteadiness on feet: Secondary | ICD-10-CM | POA: Diagnosis not present

## 2021-01-01 DIAGNOSIS — J449 Chronic obstructive pulmonary disease, unspecified: Secondary | ICD-10-CM | POA: Diagnosis not present

## 2021-01-01 DIAGNOSIS — G6289 Other specified polyneuropathies: Secondary | ICD-10-CM | POA: Diagnosis not present

## 2021-01-01 DIAGNOSIS — I480 Paroxysmal atrial fibrillation: Secondary | ICD-10-CM | POA: Diagnosis not present

## 2021-01-01 DIAGNOSIS — J45909 Unspecified asthma, uncomplicated: Secondary | ICD-10-CM | POA: Diagnosis not present

## 2021-01-01 DIAGNOSIS — I6521 Occlusion and stenosis of right carotid artery: Secondary | ICD-10-CM | POA: Diagnosis not present

## 2021-01-01 NOTE — Assessment & Plan Note (Signed)
New onset nocturnal hypoxemia.  Now on oxygen.  Feels that this is really helped her.  We will continue oxygen at 1.5 L.

## 2021-01-01 NOTE — Progress Notes (Signed)
@Patient  ID: Molly Hodges, female    DOB: Jul 06, 1932, 87 y.o.   MRN: [id number]  Chief Complaint  Patient presents with  . Follow-up    Referring provider: Birdie Sons, MD  HPI: 87 year old female never smoker seen for pulmonary consult November 20, 2020 for shortness of breath Medical history A. fib on anticoagulation and amiodarone, right carotid artery stenosis status post stent placement  TEST/EVENTS :  05/14/2020 chest x-ray:  : enlargement of the cardiac silhouette.  Interstitial thickening/edema with mild pulmonary vascular congestion.    07/27/2020  2D echo: LVEF 55 to 60%, no wall motion abnormalities on the left ventricle.  Left atrium dilated, moderate pericardial effusion thickened mitral valve leaflets mild to moderate mitral valve regurgitation, moderate mitral stenosis, right atrial pressure suggested to be 15 mmHg  11/2020 - walk test in office no desats   ,01/01/2021 Follow up ; Dyspnea  Patient returns for a 1 month follow-up.  Patient was seen last visit for pulmonary consult for shortness of breath.  Patient was set up for an overnight oximetry test that showed nocturnal desaturations.  She was started on oxygen at 1.5 L. She was set up for a 2D echo that showed EF at 58 to [id number], grade 2 diastolic dysfunction right ventricular systolic function low normal.  Left atrium moderately dilated.  Moderate to severe mitral stenosis. Pulmonary function testing was held off due to patient's ongoing dizziness.  Patient was referred to cardiology for mitral valve stenosis.  She was seen by cardiology Dec 18, 2020.  Considered not a surgical candidate at this time due to disease is not severe.  And no pulmonary hypertension.  They are going to follow with serial echo. Feels the oxygen at night , has really helped her. Less dyspnea at night .  Dyspnea during daytime has resolved currently .  Previously on Symbicort for Asthma, but says she is not taking for long time Has minimal  cough. No wheezing  Has chronic dizziness , seems slightly better. This has been a big issue for last 6 months, can be severe at times.  We discussed PFT but she wants to hold off for now as she is better and does not want to increase her dizziness.  She is on Amiodarone .    Allergies  Allergen Reactions  . Shellfish Allergy Anaphylaxis    Other reaction(s): Hallucination  . Azithromycin Other (See Comments)    Extreme burning sensation at IV site Other reaction(s): Unknown  . Tamiflu [Oseltamivir Phosphate] Other (See Comments)    Reaction:  Hallucinations  Reaction:  Hallucinations   . Albuterol Palpitations    Heart racing.     Immunization History  Administered Date(s) Administered  . Fluad Quad(high Dose 65+) 05/18/2019, 05/02/2020  . Influenza, High Dose Seasonal PF 08/28/2017, 05/29/2018  . PFIZER(Purple Top)SARS-COV-2 Vaccination 10/13/2019, 11/03/2019, 07/26/2020  . Pneumococcal Conjugate-13 05/30/2014  . Pneumococcal Polysaccharide-23 06/09/1985, 06/15/2006  . Zoster Recombinat (Shingrix) 05/05/2018, 07/20/2018    Past Medical History:  Diagnosis Date  . Asthma   . Carotid artery disease ([place])   . Community acquired pneumonia   . Essential hypertension   . Glaucoma   . Hyperlipidemia   . Mitral regurgitation    a. TTE 08/2015: EF 60-65%, normal wall motion, mild MR, mildly dilated left atrium measuring 40 mm, RVSF normal, PASP normal  . PAF (paroxysmal atrial fibrillation) ([place]) 09/17/2015   a. s/p DCCV 11/15/2015; CHADS2VASc => 4 (HTN, age x 2, female); c. on Eliquis  Tobacco History: Social History   Tobacco Use  Smoking Status Never Smoker  Smokeless Tobacco Never Used  Tobacco Comment   never    Counseling given: Not Answered Comment: never    Outpatient Medications Prior to Visit  Medication Sig Dispense Refill  . albuterol (PROVENTIL) (2.5 MG/3ML) 0.083% nebulizer solution TAKE 3 MLS (2.5 MG TOTAL) BY NEBULIZATION EVERY 4 HOURS AS NEEDED  FORUP TO 12 DOSES FOR WHEEZING OR SHORTNESSOF BREATH 75 mL 5  . albuterol (VENTOLIN HFA) 108 (90 Base) MCG/ACT inhaler Inhale 2 puffs into the lungs every 6 (six) hours as needed for wheezing or shortness of breath. 8 g 2  . amiodarone (PACERONE) 200 MG tablet TAKE ONE TABLET EVERY DAY 90 tablet 0  . apixaban (ELIQUIS) 2.5 MG TABS tablet Take 2.5 mg by mouth 2 (two) times daily.    . budesonide-formoterol (SYMBICORT) 160-4.5 MCG/ACT inhaler Inhale 2 puffs into the lungs 2 (two) times daily. 1 Inhaler 12  . Calcium Carbonate-Vitamin D 600-400 MG-UNIT tablet Take 1 tablet by mouth daily.    . cholecalciferol (VITAMIN D) 1000 UNITS tablet Take 1,000 Units by mouth daily.    Marland Kitchen diltiazem (CARDIZEM CD) 180 MG 24 hr capsule TAKE 1 CAPSULE EVERY DAY 30 capsule 11  . dorzolamide (TRUSOPT) 2 % ophthalmic solution Place 1 drop into both eyes 2 (two) times daily.     Marland Kitchen ezetimibe (ZETIA) 10 MG tablet Take 1 tablet (10 mg total) by mouth daily. 90 tablet 3  . hydrochlorothiazide (MICROZIDE) 12.5 MG capsule Take 12.5 mg by mouth daily.    Marland Kitchen ipratropium-albuterol (DUONEB) 0.5-2.5 (3) MG/3ML SOLN Take 3 mLs by nebulization every 4 (four) hours as needed. 360 mL 1  . latanoprost (XALATAN) 0.005 % ophthalmic solution Place 1 drop into both eyes at bedtime.     . levalbuterol (XOPENEX HFA) 45 MCG/ACT inhaler Inhale 2 puffs into the lungs every 4 (four) hours as needed for wheezing. 1 Inhaler 2  . magnesium oxide (MAG-OX) 400 MG tablet Take 400 mg by mouth daily.    . Omega-3 Fatty Acids (FISH OIL BURP-LESS) 1000 MG CAPS Take 1,000 mg by mouth daily.     . potassium chloride (KLOR-CON) 10 MEQ tablet Take 10 mEq by mouth daily.    . pregabalin (LYRICA) 50 MG capsule Take 1 capsule (50 mg total) by mouth 2 (two) times daily. 60 capsule 1  . rosuvastatin (CRESTOR) 20 MG tablet Take 1 tablet (20 mg total) by mouth daily. 90 tablet 3  . vitamin C (ASCORBIC ACID) 500 MG tablet Take 1,000 mg by mouth daily.    . vitamin E  100 UNIT capsule Take 200 Units by mouth daily.     No facility-administered medications prior to visit.     Review of Systems:   Constitutional:   No  weight loss, night sweats,  Fevers, chills,  +fatigue, or  Lassitude.+dizziness  HEENT:   No headaches,  Difficulty swallowing,  Tooth/dental problems, or  Sore throat,                No sneezing, itching, ear ache, nasal congestion, post nasal drip,   CV:  No chest pain,  Orthopnea, PND, swelling in lower extremities, anasarca, dizziness, palpitations, syncope.   GI  No heartburn, indigestion, abdominal pain, nausea, vomiting, diarrhea, change in bowel habits, loss of appetite, bloody stools.   Resp:  No chest wall deformity  Skin: no rash or lesions.  GU: no dysuria, change in color of urine,  no urgency or frequency.  No flank pain, no hematuria   MS:  No joint pain or swelling.  No decreased range of motion.  No back pain.    Physical Exam  BP 130/64 (BP Location: Left Arm, Patient Position: Sitting, Cuff Size: Normal)   Pulse 65   Temp (!) 97.3 F (36.3 C) (Temporal)   Ht 5\' 5"  (1.651 m)   Wt 127 lb 9.6 oz (57.9 kg)   SpO2 99%   BMI 21.23 kg/m   GEN: A/Ox3; pleasant , NAD, well nourished    HEENT:  [place]/AT,   NOSE-clear, THROAT-clear, no lesions, no postnasal drip or exudate noted.   NECK:  Supple w/ fair ROM; no JVD; normal carotid impulses w/o bruits; no thyromegaly or nodules palpated; no lymphadenopathy.    RESP  Clear  P & A; w/o, wheezes/ rales/ or rhonchi. no accessory muscle use, no dullness to percussion  CARD:  RRR, no m/r/g, tr  peripheral edema, pulses intact, no cyanosis or clubbing.  GI:   Soft & nt; nml bowel sounds; no organomegaly or masses detected.   Musco: Warm bil, no deformities or joint swelling noted.   Neuro: alert, no focal deficits noted.    Skin: Warm, no lesions or rashes    Lab Results:    BNP No results found for: BNP  ProBNP No results found for:  PROBNP  Imaging: ECHOCARDIOGRAM COMPLETE  Result Date: 12/13/2020    ECHOCARDIOGRAM REPORT   Patient Name:   FONNIE CASADO Date of Exam: 12/13/2020 Medical Rec #:  [id number]          Height:       65.0 in Accession #:    [id number]         Weight:       126.0 lb Date of Birth:  1932-05-22           BSA:          1.625 m Patient Age:    70 years           BP:           149/74 mmHg Patient Gender: F                  HR:           64 bpm. Exam Location:  [place] Procedure: 2D Echo, Cardiac Doppler and Color Doppler                             MODIFIED REPORT: This report was modified by Kate Sable MD on 12/13/2020 due to PHT.  Indications:     R42 Lightheaded; R06.9 DOE; I34.8 Other nonrheumatic mitral                  valve disorders  History:         Patient has prior history of Echocardiogram examinations, most                  recent 07/25/2020. PAD and COPD, Mitral Valve Disease,                  Arrythmias:Atrial Fibrillation, Signs/Symptoms:Dyspnea and                  Dizziness/Lightheadedness; Risk Factors:Hypertension,                  Dyslipidemia and Non-Smoker. Patient has had lightheadness and  dizziness 5 months. Patient denies chest pain. She has mild leg                  edema with DOE. Oxygen is low at night while sleeping. History                  of asthma.  Sonographer:     Salvadore Dom RVT, RDCS (AE), RDMS Referring Phys:  [id number] CARMEN Veda Canning Diagnosing Phys: Kate Sable MD  Sonographer Comments: Image acquisition challenging due to respiratory motion and Image acquisition challenging due to patient body habitus. IMPRESSIONS  1. Left ventricular ejection fraction, by estimation, is 55 to 60%. Left ventricular ejection fraction by 3D volume is 62 %. The left ventricle has normal function. The left ventricle has no regional wall motion abnormalities. Left ventricular diastolic  parameters are consistent with Grade II diastolic dysfunction  (pseudonormalization). The average left ventricular global longitudinal strain is -14.4 %. The global longitudinal strain is abnormal.  2. Right ventricular systolic function is low normal. The right ventricular size is normal. Mildly increased right ventricular wall thickness.  3. Left atrial size was moderately dilated.  4. MVA per PHT is 1.7cm2, mean gradient 45mmHg at HR 70bpm. The mitral valve is degenerative. No evidence of mitral valve regurgitation. Moderate to severe mitral stenosis.  5. The aortic valve was not well visualized. Aortic valve regurgitation is mild. Mild aortic valve sclerosis is present, with no evidence of aortic valve stenosis.  6. The inferior vena cava is dilated in size with >50% respiratory variability, suggesting right atrial pressure of 8 mmHg. Comparison(s): EF 55-60%, mild-moderate pericardial effusion, moderate MR leaflet thickening, moderate MV stenosis peak gradient 31.5 mmHg, mean gradient 10.0 mmHG, moderate MR. No evidence of aortic insufficiency. FINDINGS  Left Ventricle: Left ventricular ejection fraction, by estimation, is 55 to 60%. Left ventricular ejection fraction by 3D volume is 62 %. The left ventricle has normal function. The left ventricle has no regional wall motion abnormalities. The average left ventricular global longitudinal strain is -14.4 %. The global longitudinal strain is abnormal. The left ventricular internal cavity size was normal in size. There is no left ventricular hypertrophy. Left ventricular diastolic parameters are consistent with Grade II diastolic dysfunction (pseudonormalization). Right Ventricle: The right ventricular size is normal. Mildly increased right ventricular wall thickness. Right ventricular systolic function is low normal. Left Atrium: Left atrial size was moderately dilated. Right Atrium: Right atrial size was normal in size. Pericardium: There is no evidence of pericardial effusion. Mitral Valve: MVA per PHT is 1.7cm2, mean  gradient 18mmHg at HR 70bpm. The mitral valve is degenerative in appearance. There is moderate thickening of the mitral valve leaflet(s). There is mild calcification of the mitral valve leaflet(s). Mild mitral annular calcification. No evidence of mitral valve regurgitation. Moderate to severe mitral valve stenosis. MV peak gradient, 34.0 mmHg. The mean mitral valve gradient is 11.0 mmHg. Tricuspid Valve: The tricuspid valve is not well visualized. Tricuspid valve regurgitation is not demonstrated. Aortic Valve: The aortic valve was not well visualized. Aortic valve regurgitation is mild. Aortic regurgitation PHT measures 372 msec. Mild aortic valve sclerosis is present, with no evidence of aortic valve stenosis. Aortic valve mean gradient measures  2.0 mmHg. Aortic valve peak gradient measures 6.0 mmHg. Aortic valve area, by VTI measures 2.26 cm. Pulmonic Valve: The pulmonic valve was not well visualized. Pulmonic valve regurgitation is not visualized. Aorta: The aortic root and ascending aorta are structurally normal, with no evidence of  dilitation. Venous: The inferior vena cava is dilated in size with greater than 50% respiratory variability, suggesting right atrial pressure of 8 mmHg. IAS/Shunts: No atrial level shunt detected by color flow Doppler.  LEFT VENTRICLE PLAX 2D LVIDd:         4.70 cm         Diastology LVIDs:         3.00 cm         LV e' medial:    3.90 cm/s LV PW:         1.00 cm         LV E/e' medial:  54.6 LV IVS:        0.80 cm         LV e' lateral:   4.80 cm/s LVOT diam:     1.90 cm         LV E/e' lateral: 44.4 LV SV:         52 LV SV Index:   32              2D LVOT Area:     2.84 cm        Longitudinal                                Strain                                2D Strain GLS  -14.4 % LV Volumes (MOD)               Avg: LV vol d, MOD    82.9 ml A2C:                           3D Volume EF LV vol d, MOD    61.1 ml       LV 3D EF:    Left A4C:                                         ventricular LV vol s, MOD    29.5 ml                    ejection A2C:                                        fraction by LV vol s, MOD    16.3 ml                    3D volume A4C:                                        is 62 %. LV SV MOD A2C:   53.4 ml LV SV MOD A4C:   61.1 ml LV SV MOD BP:    49.2 ml       3D Volume EF:  3D EF:        62 %                                LV EDV:       98 ml                                LV ESV:       38 ml                                LV SV:        60 ml RIGHT VENTRICLE RV S prime:     8.70 cm/s TAPSE (M-mode): 1.8 cm LEFT ATRIUM             Index       RIGHT ATRIUM           Index LA diam:        3.80 cm 2.34 cm/m  RA Area:     17.30 cm LA Vol (A2C):   89.1 ml 54.82 ml/m RA Volume:   38.50 ml  23.69 ml/m LA Vol (A4C):   70.1 ml 43.13 ml/m LA Biplane Vol: 79.7 ml 49.03 ml/m  AORTIC VALVE                   PULMONIC VALVE AV Area (Vmax):    1.89 cm    PV Vmax:       0.67 m/s AV Area (Vmean):   2.09 cm    PV Peak grad:  1.8 mmHg AV Area (VTI):     2.26 cm AV Vmax:           122.00 cm/s AV Vmean:          67.500 cm/s AV VTI:            0.230 m AV Peak Grad:      6.0 mmHg AV Mean Grad:      2.0 mmHg LVOT Vmax:         81.30 cm/s LVOT Vmean:        49.800 cm/s LVOT VTI:          0.183 m LVOT/AV VTI ratio: 0.80 AI PHT:            372 msec  AORTA Ao Root diam: 3.10 cm Ao Asc diam:  3.00 cm MITRAL VALVE MV Area (PHT): 1.81 cm     SHUNTS MV Area VTI:   0.67 cm     Systemic VTI:  0.18 m MV Peak grad:  34.0 mmHg    Systemic Diam: 1.90 cm MV Mean grad:  11.0 mmHg MV Vmax:       2.92 m/s MV Vmean:      147.7 cm/s MV Decel Time: 419 msec MV E velocity: 213.00 cm/s Kate Sable MD Electronically signed by Kate Sable MD Signature Date/Time: 12/13/2020/5:47:08 PM    Final (Updated)    VAS US CAROTID  Result Date: 12/04/2020 Carotid Arterial Duplex Study Indications:       Syncope and Right stent. Comparison Study:  09/07/2020 Performing  Technologist: Charlane Ferretti RT (R)(VS)  Examination Guidelines: A complete evaluation includes B-mode imaging, spectral Doppler, color Doppler, and power Doppler as needed of all accessible portions of each vessel. Bilateral testing is considered an integral  part of a complete examination. Limited examinations for reoccurring indications may be performed as noted.  Right Carotid Findings: +----------+--------+--------+--------+------------------+--------------------+           PSV cm/sEDV cm/sStenosisPlaque DescriptionComments             +----------+--------+--------+--------+------------------+--------------------+ CCA Prox  87      8               calcific                               +----------+--------+--------+--------+------------------+--------------------+ CCA Mid   82      9                                 intimal thickening   +----------+--------+--------+--------+------------------+--------------------+ CCA Distal107     11                                Stent                +----------+--------+--------+--------+------------------+--------------------+ ICA Prox  133     22                                Stant                +----------+--------+--------+--------+------------------+--------------------+ ICA Mid   143     18                                ICA/CCA ratio = 1.64 +----------+--------+--------+--------+------------------+--------------------+ ICA Distal111     17                                                     +----------+--------+--------+--------+------------------+--------------------+ ECA       228     1                                                      +----------+--------+--------+--------+------------------+--------------------+ +----------+--------+-------+-----------+-------------------+           PSV cm/sEDV cmsDescribe   Arm Pressure (mmHG) +----------+--------+-------+-----------+-------------------+  [id number]            Multiphasic                    +----------+--------+-------+-----------+-------------------+ +---------+--------+--+--------+-+---------+ VertebralPSV cm/s52EDV cm/s1Antegrade +---------+--------+--+--------+-+---------+ Left Carotid Findings: +----------+--------+--------+--------+------------------+--------------------+           PSV cm/sEDV cm/sStenosisPlaque DescriptionComments             +----------+--------+--------+--------+------------------+--------------------+ CCA Prox  78      8                                                      +----------+--------+--------+--------+------------------+--------------------+ CCA Mid   75  9               calcific          intimal thickening   +----------+--------+--------+--------+------------------+--------------------+ CCA Distal88      10              calcific          intimal thickening   +----------+--------+--------+--------+------------------+--------------------+ ICA Prox  157     15              calcific          intimal thickening   +----------+--------+--------+--------+------------------+--------------------+ ICA Mid   141     16                                ICA/CCA ratio = 2.01 +----------+--------+--------+--------+------------------+--------------------+ ICA Distal120     11                                                     +----------+--------+--------+--------+------------------+--------------------+ ECA       167     8               calcific                               +----------+--------+--------+--------+------------------+--------------------+ +----------+--------+--------+-----------+-------------------+           PSV cm/sEDV cm/sDescribe   Arm Pressure (mmHG) +----------+--------+--------+-----------+-------------------+ Subclavian210             Multiphasic                     +----------+--------+--------+-----------+-------------------+ +---------+--------+--+--------+--+---------+ VertebralPSV cm/s50EDV cm/s11Antegrade +---------+--------+--+--------+--+---------+ Summary: Right Carotid: Velocities in the right ICA are consistent with a 40-59%                stenosis. The ECA appears >50% stenosed. Patent right ICA stent.                Right ECA velocity is elevaed possibly due to stent. Left Carotid: Velocities in the left ICA are consistent with a 40-59% stenosis.               The ECA appears >50% stenosed. Vertebrals:  Bilateral vertebral arteries demonstrate antegrade flow. Subclavians: Normal flow hemodynamics were seen in bilateral subclavian              arteries. *See table(s) above for measurements and observations.  Electronically signed by Leotis Pain MD on 12/04/2020 at 1:03:42 PM.    Final       No flowsheet data found.  No results found for: NITRICOXIDE      Assessment & Plan:   Asthma Possible mild intermittent asthma - no perceived benefit with Symbicort  May use Albuterol inhaler As needed   PFT in future would be helpful but patient declines at this time due to dizziness   Plan  Patient Instructions  Albuterol inhaler As needed   Activity as tolerated Continue on Oxygen 1.5l/m At bedtime   Follow up with Dr. Patsey Berthold in 6 months and As needed        Dyspnea Dyspnea suspect is multifactorial-patient has moderate to severe mitral stenosis and grade 2 diastolic dysfunction.,  Suspect a deconditioning component  as well. Will continue to follow.  Patient is on amiodarone have been unable to do pulmonary function testing.  Patient has no significant cough or wheezing.  Shortness of breath seems to be improved. Does have some nocturnal hypoxemia.  We will continue on oxygen Plan  Patient Instructions  Albuterol inhaler As needed   Activity as tolerated Continue on Oxygen 1.5l/m At bedtime   Follow up with Dr. Patsey Berthold in 6 months  and As needed        Chronic respiratory failure with hypoxia ([place]) New onset nocturnal hypoxemia.  Now on oxygen.  Feels that this is really helped her.  We will continue oxygen at 1.5 L.   I spent   35 minutes dedicated to the care of this patient on the date of this encounter to include pre-visit review of records, face-to-face time with the patient discussing conditions above, post visit ordering of testing, clinical documentation with the electronic health record, making appropriate referrals as documented, and communicating necessary findings to members of the patients care team.    Rexene Edison, NP 01/01/2021

## 2021-01-01 NOTE — Patient Instructions (Signed)
Albuterol inhaler As needed   Activity as tolerated Continue on Oxygen 1.5l/m At bedtime   Follow up with Dr. Patsey Berthold in 6 months and As needed

## 2021-01-01 NOTE — Assessment & Plan Note (Signed)
Possible mild intermittent asthma - no perceived benefit with Symbicort  May use Albuterol inhaler As needed   PFT in future would be helpful but patient declines at this time due to dizziness   Plan  Patient Instructions  Albuterol inhaler As needed   Activity as tolerated Continue on Oxygen 1.5l/m At bedtime   Follow up with Dr. Patsey Berthold in 6 months and As needed

## 2021-01-01 NOTE — Assessment & Plan Note (Signed)
Dyspnea suspect is multifactorial-patient has moderate to severe mitral stenosis and grade 2 diastolic dysfunction.,  Suspect a deconditioning component as well. Will continue to follow.  Patient is on amiodarone have been unable to do pulmonary function testing.  Patient has no significant cough or wheezing.  Shortness of breath seems to be improved. Does have some nocturnal hypoxemia.  We will continue on oxygen Plan  Patient Instructions  Albuterol inhaler As needed   Activity as tolerated Continue on Oxygen 1.5l/m At bedtime   Follow up with Dr. Patsey Berthold in 6 months and As needed

## 2021-01-02 DIAGNOSIS — R42 Dizziness and giddiness: Secondary | ICD-10-CM | POA: Diagnosis not present

## 2021-01-02 DIAGNOSIS — J449 Chronic obstructive pulmonary disease, unspecified: Secondary | ICD-10-CM | POA: Diagnosis not present

## 2021-01-02 DIAGNOSIS — R2681 Unsteadiness on feet: Secondary | ICD-10-CM | POA: Diagnosis not present

## 2021-01-02 DIAGNOSIS — I6521 Occlusion and stenosis of right carotid artery: Secondary | ICD-10-CM | POA: Diagnosis not present

## 2021-01-02 DIAGNOSIS — J45909 Unspecified asthma, uncomplicated: Secondary | ICD-10-CM | POA: Diagnosis not present

## 2021-01-02 DIAGNOSIS — G6289 Other specified polyneuropathies: Secondary | ICD-10-CM | POA: Diagnosis not present

## 2021-01-02 DIAGNOSIS — Z9981 Dependence on supplemental oxygen: Secondary | ICD-10-CM | POA: Diagnosis not present

## 2021-01-02 DIAGNOSIS — I480 Paroxysmal atrial fibrillation: Secondary | ICD-10-CM | POA: Diagnosis not present

## 2021-01-07 DIAGNOSIS — J45909 Unspecified asthma, uncomplicated: Secondary | ICD-10-CM | POA: Diagnosis not present

## 2021-01-07 DIAGNOSIS — R42 Dizziness and giddiness: Secondary | ICD-10-CM | POA: Diagnosis not present

## 2021-01-07 DIAGNOSIS — I480 Paroxysmal atrial fibrillation: Secondary | ICD-10-CM | POA: Diagnosis not present

## 2021-01-07 DIAGNOSIS — R2681 Unsteadiness on feet: Secondary | ICD-10-CM | POA: Diagnosis not present

## 2021-01-07 DIAGNOSIS — Z9981 Dependence on supplemental oxygen: Secondary | ICD-10-CM | POA: Diagnosis not present

## 2021-01-07 DIAGNOSIS — I6521 Occlusion and stenosis of right carotid artery: Secondary | ICD-10-CM | POA: Diagnosis not present

## 2021-01-07 DIAGNOSIS — J449 Chronic obstructive pulmonary disease, unspecified: Secondary | ICD-10-CM | POA: Diagnosis not present

## 2021-01-07 DIAGNOSIS — G6289 Other specified polyneuropathies: Secondary | ICD-10-CM | POA: Diagnosis not present

## 2021-01-08 ENCOUNTER — Other Ambulatory Visit: Payer: Self-pay | Admitting: Cardiovascular Disease

## 2021-01-09 DIAGNOSIS — J45909 Unspecified asthma, uncomplicated: Secondary | ICD-10-CM | POA: Diagnosis not present

## 2021-01-09 DIAGNOSIS — Z9981 Dependence on supplemental oxygen: Secondary | ICD-10-CM | POA: Diagnosis not present

## 2021-01-09 DIAGNOSIS — I6521 Occlusion and stenosis of right carotid artery: Secondary | ICD-10-CM | POA: Diagnosis not present

## 2021-01-09 DIAGNOSIS — R42 Dizziness and giddiness: Secondary | ICD-10-CM | POA: Diagnosis not present

## 2021-01-09 DIAGNOSIS — R2681 Unsteadiness on feet: Secondary | ICD-10-CM | POA: Diagnosis not present

## 2021-01-09 DIAGNOSIS — J449 Chronic obstructive pulmonary disease, unspecified: Secondary | ICD-10-CM | POA: Diagnosis not present

## 2021-01-09 DIAGNOSIS — G6289 Other specified polyneuropathies: Secondary | ICD-10-CM | POA: Diagnosis not present

## 2021-01-09 DIAGNOSIS — I480 Paroxysmal atrial fibrillation: Secondary | ICD-10-CM | POA: Diagnosis not present

## 2021-01-10 DIAGNOSIS — G6289 Other specified polyneuropathies: Secondary | ICD-10-CM | POA: Diagnosis not present

## 2021-01-11 NOTE — Progress Notes (Signed)
Agree with the details of the visit as noted by Tammy Parrett, NP.  C. Laura Reynoldo Mainer, MD [facility] 

## 2021-01-11 NOTE — Progress Notes (Signed)
Agree with the details of the visit as noted by Tammy Parrett, NP.  C. Laura Selicia Windom, MD [facility] 

## 2021-01-15 DIAGNOSIS — I480 Paroxysmal atrial fibrillation: Secondary | ICD-10-CM | POA: Diagnosis not present

## 2021-01-15 DIAGNOSIS — R2681 Unsteadiness on feet: Secondary | ICD-10-CM | POA: Diagnosis not present

## 2021-01-15 DIAGNOSIS — Z9981 Dependence on supplemental oxygen: Secondary | ICD-10-CM | POA: Diagnosis not present

## 2021-01-15 DIAGNOSIS — I6521 Occlusion and stenosis of right carotid artery: Secondary | ICD-10-CM | POA: Diagnosis not present

## 2021-01-15 DIAGNOSIS — R42 Dizziness and giddiness: Secondary | ICD-10-CM | POA: Diagnosis not present

## 2021-01-15 DIAGNOSIS — J45909 Unspecified asthma, uncomplicated: Secondary | ICD-10-CM | POA: Diagnosis not present

## 2021-01-15 DIAGNOSIS — J449 Chronic obstructive pulmonary disease, unspecified: Secondary | ICD-10-CM | POA: Diagnosis not present

## 2021-01-15 DIAGNOSIS — G6289 Other specified polyneuropathies: Secondary | ICD-10-CM | POA: Diagnosis not present

## 2021-01-18 DIAGNOSIS — I6521 Occlusion and stenosis of right carotid artery: Secondary | ICD-10-CM | POA: Diagnosis not present

## 2021-01-18 DIAGNOSIS — R2681 Unsteadiness on feet: Secondary | ICD-10-CM | POA: Diagnosis not present

## 2021-01-18 DIAGNOSIS — I480 Paroxysmal atrial fibrillation: Secondary | ICD-10-CM | POA: Diagnosis not present

## 2021-01-18 DIAGNOSIS — Z9981 Dependence on supplemental oxygen: Secondary | ICD-10-CM | POA: Diagnosis not present

## 2021-01-18 DIAGNOSIS — R42 Dizziness and giddiness: Secondary | ICD-10-CM | POA: Diagnosis not present

## 2021-01-18 DIAGNOSIS — G6289 Other specified polyneuropathies: Secondary | ICD-10-CM | POA: Diagnosis not present

## 2021-01-18 DIAGNOSIS — J449 Chronic obstructive pulmonary disease, unspecified: Secondary | ICD-10-CM | POA: Diagnosis not present

## 2021-01-18 DIAGNOSIS — J45909 Unspecified asthma, uncomplicated: Secondary | ICD-10-CM | POA: Diagnosis not present

## 2021-01-19 ENCOUNTER — Other Ambulatory Visit: Payer: Self-pay | Admitting: Family Medicine

## 2021-01-19 DIAGNOSIS — R0902 Hypoxemia: Secondary | ICD-10-CM | POA: Diagnosis not present

## 2021-01-19 NOTE — Telephone Encounter (Signed)
Requested medication (s) are due for refill today: no  Requested medication (s) are on the active medication list: yes  Last refill:  12/03/20 #60 1 RF  Future visit scheduled: no  Notes to clinic:  med not delegated to NT to RF   Requested Prescriptions  Pending Prescriptions Disp Refills   pregabalin (LYRICA) 50 MG capsule [Pharmacy Med Name: PREGABALIN 50 MG CAP] 60 capsule     Sig: TAKE 1 CAPSULE BY MOUTH 2 TIMES DAILY      Not Delegated - Neurology:  Anticonvulsants - Controlled Failed - 01/19/2021  1:11 PM      Failed - This refill cannot be delegated      Passed - Valid encounter within last 12 months    Recent Outpatient Visits           2 months ago [place], Adriana M, PA-C   3 months ago Uncomplicated asthma, unspecified asthma severity, unspecified whether persistent   [place], [place], PA-C   4 months ago Bilateral carotid artery stenosis   [facility] Birdie Sons, MD   6 months ago [place], Donald E, MD   8 months ago COPD with acute bronchitis [facility])   [facility] Trinna Post, [place]       Future Appointments             In 2 weeks [place], Kathlene November, MD [facility]

## 2021-01-21 DIAGNOSIS — I6521 Occlusion and stenosis of right carotid artery: Secondary | ICD-10-CM | POA: Diagnosis not present

## 2021-01-21 DIAGNOSIS — Z9981 Dependence on supplemental oxygen: Secondary | ICD-10-CM | POA: Diagnosis not present

## 2021-01-21 DIAGNOSIS — R42 Dizziness and giddiness: Secondary | ICD-10-CM | POA: Diagnosis not present

## 2021-01-21 DIAGNOSIS — I480 Paroxysmal atrial fibrillation: Secondary | ICD-10-CM | POA: Diagnosis not present

## 2021-01-21 DIAGNOSIS — R2681 Unsteadiness on feet: Secondary | ICD-10-CM | POA: Diagnosis not present

## 2021-01-21 DIAGNOSIS — G6289 Other specified polyneuropathies: Secondary | ICD-10-CM | POA: Diagnosis not present

## 2021-01-21 DIAGNOSIS — J45909 Unspecified asthma, uncomplicated: Secondary | ICD-10-CM | POA: Diagnosis not present

## 2021-01-21 DIAGNOSIS — J449 Chronic obstructive pulmonary disease, unspecified: Secondary | ICD-10-CM | POA: Diagnosis not present

## 2021-01-25 DIAGNOSIS — Z9981 Dependence on supplemental oxygen: Secondary | ICD-10-CM | POA: Diagnosis not present

## 2021-01-25 DIAGNOSIS — I6521 Occlusion and stenosis of right carotid artery: Secondary | ICD-10-CM | POA: Diagnosis not present

## 2021-01-25 DIAGNOSIS — R42 Dizziness and giddiness: Secondary | ICD-10-CM | POA: Diagnosis not present

## 2021-01-25 DIAGNOSIS — J45909 Unspecified asthma, uncomplicated: Secondary | ICD-10-CM | POA: Diagnosis not present

## 2021-01-25 DIAGNOSIS — J449 Chronic obstructive pulmonary disease, unspecified: Secondary | ICD-10-CM | POA: Diagnosis not present

## 2021-01-25 DIAGNOSIS — G6289 Other specified polyneuropathies: Secondary | ICD-10-CM | POA: Diagnosis not present

## 2021-01-25 DIAGNOSIS — R2681 Unsteadiness on feet: Secondary | ICD-10-CM | POA: Diagnosis not present

## 2021-01-25 DIAGNOSIS — I480 Paroxysmal atrial fibrillation: Secondary | ICD-10-CM | POA: Diagnosis not present

## 2021-02-04 ENCOUNTER — Ambulatory Visit: Payer: Medicare Other | Admitting: Cardiovascular Disease

## 2021-02-05 DIAGNOSIS — H401131 Primary open-angle glaucoma, bilateral, mild stage: Secondary | ICD-10-CM | POA: Diagnosis not present

## 2021-02-05 DIAGNOSIS — H353132 Nonexudative age-related macular degeneration, bilateral, intermediate dry stage: Secondary | ICD-10-CM | POA: Diagnosis not present

## 2021-02-11 ENCOUNTER — Other Ambulatory Visit: Payer: Self-pay | Admitting: Pharmacist Clinician (PhC)/ Clinical Pharmacy Specialist

## 2021-02-11 ENCOUNTER — Other Ambulatory Visit: Payer: Self-pay | Admitting: Cardiovascular Disease

## 2021-02-11 MED ORDER — APIXABAN 2.5 MG PO TABS: 2.5000 mg | ORAL_TABLET | Freq: Two times a day (BID) | ORAL | 1 refills | Status: DC

## 2021-02-11 NOTE — Telephone Encounter (Signed)
*  STAT* If patient is at the pharmacy, call can be transferred to refill team.   1. Which medications need to be refilled? (please list name of each medication and dose if known) apixaban (ELIQUIS) 2.5 MG 2 times daily  2. Which pharmacy/location (including street and city if local pharmacy) is medication to be sent to? [facility]   3. Do they need a 30 day or 90 day supply? 90 day

## 2021-02-11 NOTE — Telephone Encounter (Signed)
Pt needs lower dose of 2.5mg  eliquis. The original prescription was discontinued on 03/26/2020 by Valora Corporal, RN for the following reason: Reorder. Renewing this prescription may not be appropriate. 20f, 57.9kg, scr 0.75 05/14/20, lovw/gollan 12/18/20. Routing to the pharmd pool

## 2021-02-18 DIAGNOSIS — R0902 Hypoxemia: Secondary | ICD-10-CM | POA: Diagnosis not present

## 2021-03-21 DIAGNOSIS — R0902 Hypoxemia: Secondary | ICD-10-CM | POA: Diagnosis not present

## 2021-04-21 DIAGNOSIS — R0902 Hypoxemia: Secondary | ICD-10-CM | POA: Diagnosis not present

## 2021-04-29 ENCOUNTER — Telehealth: Payer: Self-pay | Admitting: Family Medicine

## 2021-04-29 MED ORDER — IPRATROPIUM-ALBUTEROL 0.5-2.5 (3) MG/3ML IN SOLN: 3.0000 mL | RESPIRATORY_TRACT | 1 refills | Status: DC | PRN

## 2021-04-29 NOTE — Telephone Encounter (Signed)
[facility] faxed refill request for the following medications:  ipratropium-albuterol (DUONEB) 0.5-2.5 (3) MG/3ML SOLN  Please advise. Thanks TNP

## 2021-04-30 ENCOUNTER — Telehealth: Payer: Self-pay | Admitting: Cardiovascular Disease

## 2021-04-30 NOTE — Telephone Encounter (Signed)
Pt c/o swelling: STAT is pt has developed SOB within 24 hours  If swelling, where is the swelling located? Ankles & foot  How much weight have you gained and in what time span? no  Have you gained 3 pounds in a day or 5 pounds in a week? no  Do you have a log of your daily weights (if so, list)? no  Are you currently taking a fluid pill? lasix  Are you currently SOB? Yes, asthma  Have you traveled recently? no

## 2021-04-30 NOTE — Telephone Encounter (Signed)
Was able to reach out to Molly Hodges, she reports over the past 3-4 days she has had increased SOB, using her inhaler more then usual, noted some fatigue and her feet/ankles have been swelling. Pt stated "I press on them and it has an indention that takes a while for it to come back".  Pt is not on any lasix of torsemide, stated that Molly Hodges has used Lasix in the past by her another doctor took her off a  year of so ago. Reports she has noticed any weight gain, "scales have been about the same". Reports "my BP has been good and my HR in the 60s".  Pt has an appt with Molly Hodges with vascular 11/1, but was able to get her in for an appt with Molly Hodges on 9/22 at 11am.   Regarding her swelling, educated her on things to try before her appt.  I have asked her to: - elevate her lower extremities when possible - use some compression socks if able - decrease her salt intake  - weight herself daily in the morning after getting up and voiding and record these weights  Advised if her SOB gets worse, swelling is increasing, having CP, and/or her fatigue is worsening then reach out to PCP for a sooner appt or may seek the ED for evaluation. Molly Hodges verbalized understanding, very thankful for the return call and advices.

## 2021-05-07 NOTE — Progress Notes (Signed)
Cardiology Office Note    Date:  05/09/2021   ID:  Myrle, Wanek [birth year], MRN [id number]  PCP:  Birdie Sons, MD  Cardiologist:  Ida Rogue, MD  Electrophysiologist:  None   Chief Complaint: Follow-up  History of Present Illness:   Molly Hodges is a 87 y.o. female with history of PAF status post DCCV in 10/2015, moderate to severe mitral stenosis, pericardial effusion, carotid artery stenosis status post right-sided stenting in 07/2020, asthma, HTN, and chronic dizziness who presents for follow-up of echo.  With regards to her A. fib, she was diagnosed with this in 08/2015 in the context of pneumonia.  She was started on Eliquis and rate controlled.  Echo at that time showed an EF of 60 to 65%, normal wall motion, mild mitral regurgitation, mildly dilated left atrium measuring 40 mm, normal RV systolic function, and normal PASP.  She subsequently underwent a DCCV in 10/2015.  Outpatient cardiac monitoring in 05/2020 showed a predominant rhythm of sinus with an average heart rate of 64 bpm, second-degree AV block type I was present with frequent 2-second pauses predominantly occurring at nighttime.  Echo in 07/2020 showed an EF of 55 to 60%, no regional wall motion abnormalities, indeterminate LV diastolic function parameters, normal RV systolic function and ventricular cavity size, mildly dilated left atrium, mild to moderate circumferential pericardial effusion, mild to moderate mitral regurgitation, moderate mitral stenosis with a mean gradient of 10 mmHg, and an estimated right atrial pressure of 15 mmHg.  Outpatient cardiac monitoring performed through the PCPs office in 10/2020, for chronic dizziness, showed a predominant rhythm of sinus, average heart rate 65 bpm with a range of 54 to 101 bpm, first-degree AV block, and rare PACs and PVCs.  No significant arrhythmias, prolonged pauses, or high-grade AV block were noted.  Most recent carotid artery ultrasound from 11/2020  showed bilateral ICA 40 to 59% stenosis with velocities elevated along the right side felt to possibly be in the setting of prior stenting.  Patent RICA stent was noted.  There was bilateral antegrade flow of the vertebral arteries and normal flow hemodynamics of the bilateral subclavian arteries.  She was evaluated by pulmonology in 11/2020 for ongoing shortness of breath which was not felt to be related to pulmonary disease.  They had suspicion for possible pulmonary hypertension or worsening mitral valve disease.  They made note that the patient did not have COPD.  Echo, as ordered by pulmonology on 12/13/2020, showed an EF of 55 to 60%, no regional wall motion abnormalities, grade 2 diastolic dysfunction, low normal RV systolic function with normal RV cavity size and mildly increased RV wall thickness, moderately dilated left atrium, degenerative mitral valve with no evidence of regurgitation and moderate to severe mitral stenosis with a MVA per PHT of 1.7 cm and a mean gradient of 11 mmHg with a heart rate of 70 bpm, no evidence of pericardial effusion, mild aortic valve insufficiency and mild aortic valve sclerosis without evidence of stenosis, and an estimated right atrial pressure of 8 mmHg.  Given these echo findings, it was recommended she follow-up with [place] today.  She comes in today noting chronic stable dizziness and dyspnea.  No angina, palpitations, presyncope, or syncope.  She does report her dyspnea is improved with nebulizer use.  With regards to her dizziness, she has previously undergone extensive work-up with cardiology, ENT, and neurology.  Home BP log reviewed with readings largely stable in the 120s to 130s for the  most part with an occasional reading in the low 100s and up to low 235T systolic.   Labs independently reviewed: 10/2020 - potassium 3.9, Hgb 11.5, PLT 299 08/2020 - TC 119, TG 58, HDL 66, LDL 40, BUN 17, serum creatinine 1.03, albumin 4.3, AST normal, ALT 44, A1c 6.6 06/2020 -  TSH normal  Past Medical History:  Diagnosis Date   Asthma    Carotid artery disease ([place])    Community acquired pneumonia    Essential hypertension    Glaucoma    Hyperlipidemia    Mitral regurgitation    a. TTE 08/2015: EF 60-65%, normal wall motion, mild MR, mildly dilated left atrium measuring 40 mm, RVSF normal, PASP normal   PAF (paroxysmal atrial fibrillation) ([place]) 09/17/2015   a. s/p DCCV 11/15/2015; CHADS2VASc => 4 (HTN, age x 2, female); c. on Eliquis    Past Surgical History:  Procedure Laterality Date   ABDOMINAL HYSTERECTOMY  1987   due to heavy bleeding   APPENDECTOMY     CAROTID PTA/STENT INTERVENTION Right 08/15/2020   Procedure: CAROTID PTA/STENT INTERVENTION;  Surgeon: Algernon Huxley, MD;  Location: [facility];  Service: Cardiovascular;  Laterality: Right;   CATARACT EXTRACTION     ELECTROPHYSIOLOGIC STUDY N/A 11/15/2015   Procedure: CARDIOVERSION;  Surgeon: Minna Merritts, MD;  Location: [facility];  Service: Cardiovascular;  Laterality: N/A;    Current Medications: Current Meds  Medication Sig   albuterol (PROVENTIL) (2.5 MG/3ML) 0.083% nebulizer solution TAKE 3 MLS (2.5 MG TOTAL) BY NEBULIZATION EVERY 4 HOURS AS NEEDED FORUP TO 12 DOSES FOR WHEEZING OR SHORTNESSOF BREATH   albuterol (VENTOLIN HFA) 108 (90 Base) MCG/ACT inhaler Inhale 2 puffs into the lungs every 6 (six) hours as needed for wheezing or shortness of breath.   amiodarone (PACERONE) 200 MG tablet TAKE ONE TABLET EVERY DAY   apixaban (ELIQUIS) 2.5 MG TABS tablet Take 1 tablet (2.5 mg total) by mouth 2 (two) times daily.   budesonide-formoterol (SYMBICORT) 160-4.5 MCG/ACT inhaler Inhale 2 puffs into the lungs 2 (two) times daily.   Calcium Carbonate-Vitamin D 600-400 MG-UNIT tablet Take 1 tablet by mouth daily.   cholecalciferol (VITAMIN D) 1000 UNITS tablet Take 1,000 Units by mouth daily.   diltiazem (CARDIZEM CD) 180 MG 24 hr capsule TAKE 1 CAPSULE EVERY DAY   dorzolamide (TRUSOPT) 2 %  ophthalmic solution Place 1 drop into both eyes 2 (two) times daily.    ezetimibe (ZETIA) 10 MG tablet Take 1 tablet (10 mg total) by mouth daily.   hydrochlorothiazide (MICROZIDE) 12.5 MG capsule Take 12.5 mg by mouth daily.   ipratropium-albuterol (DUONEB) 0.5-2.5 (3) MG/3ML SOLN Take 3 mLs by nebulization every 4 (four) hours as needed.   latanoprost (XALATAN) 0.005 % ophthalmic solution Place 1 drop into both eyes at bedtime.    levalbuterol (XOPENEX HFA) 45 MCG/ACT inhaler Inhale 2 puffs into the lungs every 4 (four) hours as needed for wheezing.   magnesium oxide (MAG-OX) 400 MG tablet Take 400 mg by mouth daily.   Omega-3 Fatty Acids (FISH OIL BURP-LESS) 1000 MG CAPS Take 1,000 mg by mouth daily.    potassium chloride (KLOR-CON) 10 MEQ tablet Take 10 mEq by mouth daily.   pregabalin (LYRICA) 50 MG capsule TAKE 1 CAPSULE BY MOUTH 2 TIMES DAILY   rosuvastatin (CRESTOR) 20 MG tablet Take 1 tablet (20 mg total) by mouth daily.   vitamin C (ASCORBIC ACID) 500 MG tablet Take 1,000 mg by mouth daily.   vitamin  E 100 UNIT capsule Take 200 Units by mouth daily.   Current Facility-Administered Medications for the 05/09/21 encounter (Office Visit) with Rise Mu, PA-C  Medication   sodium chloride flush (NS) 0.9 % injection 3 mL    Allergies:   Shellfish allergy, Azithromycin, Tamiflu [oseltamivir phosphate], and Albuterol   Social History   Socioeconomic History   Marital status: Married    Spouse name: Not on file   Number of children: 4   Years of education: Not on file   Highest education level: 12th grade  Occupational History   Occupation: retired  Tobacco Use   Smoking status: Never   Smokeless tobacco: Never   Tobacco comments:    never   Vaping Use   Vaping Use: Never used  Substance and Sexual Activity   Alcohol use: No   Drug use: Never   Sexual activity: Not on file  Other Topics Concern   Not on file  Social History Narrative   Lives at home with Husband.  Active and Independent at baseline.   Social Determinants of Health   Financial Resource Strain: Low Risk    Difficulty of Paying Living Expenses: Not hard at all  Food Insecurity: No Food Insecurity   Worried About Charity fundraiser in the Last Year: Never true   [place] in the Last Year: Never true  Transportation Needs: No Transportation Needs   Lack of Transportation (Medical): No   Lack of Transportation (Non-Medical): No  Physical Activity: Inactive   Days of Exercise per Week: 0 days   Minutes of Exercise per Session: 0 min  Stress: No Stress Concern Present   Feeling of Stress : Not at all  Social Connections: Moderately Isolated   Frequency of Communication with Friends and Family: More than three times a week   Frequency of Social Gatherings with Friends and Family: More than three times a week   Attends Religious Services: Never   Marine scientist or Organizations: No   Attends Music therapist: Never   Marital Status: Married     Family History:  The patient's family history includes CAD in her father and mother; Cancer (age of onset: 59) in her son.  ROS:   Review of Systems  Constitutional:  Positive for malaise/fatigue. Negative for chills, diaphoresis, fever and weight loss.  HENT:  Negative for congestion.   Eyes:  Negative for discharge and redness.  Respiratory:  Positive for shortness of breath. Negative for cough, sputum production and wheezing.   Cardiovascular:  Negative for chest pain, palpitations, orthopnea, claudication, leg swelling and PND.  Gastrointestinal:  Negative for abdominal pain, blood in stool, heartburn, melena, nausea and vomiting.  Musculoskeletal:  Negative for falls and myalgias.  Skin:  Negative for rash.  Neurological:  Positive for dizziness. Negative for tingling, tremors, sensory change, speech change, focal weakness, loss of consciousness and weakness.  Endo/Heme/Allergies:  Does not bruise/bleed  easily.  Psychiatric/Behavioral:  Negative for substance abuse. The patient is not nervous/anxious.   All other systems reviewed and are negative.   EKGs/Labs/Other Studies Reviewed:    Studies reviewed were summarized above. The additional studies were reviewed today:  2D echo 12/13/2020: 1. Left ventricular ejection fraction, by estimation, is 55 to 60%. Left  ventricular ejection fraction by 3D volume is 62 %. The left ventricle has  normal function. The left ventricle has no regional wall motion  abnormalities. Left ventricular diastolic   parameters are consistent  with Grade II diastolic dysfunction  (pseudonormalization). The average left ventricular global longitudinal  strain is -14.4 %. The global longitudinal strain is abnormal.   2. Right ventricular systolic function is low normal. The right  ventricular size is normal. Mildly increased right ventricular wall  thickness.   3. Left atrial size was moderately dilated.   4. MVA per PHT is 1.7cm2, mean gradient 44mmHg at HR 70bpm. The mitral  valve is degenerative. No evidence of mitral valve regurgitation. Moderate  to severe mitral stenosis.   5. The aortic valve was not well visualized. Aortic valve regurgitation  is mild. Mild aortic valve sclerosis is present, with no evidence of  aortic valve stenosis.   6. The inferior vena cava is dilated in size with >50% respiratory  variability, suggesting right atrial pressure of 8 mmHg.   Comparison(s): EF 55-60%, mild-moderate pericardial effusion, moderate MR  leaflet thickening, moderate MV stenosis peak gradient 31.5 mmHg, mean  gradient 10.0 mmHG, moderate MR. No evidence of aortic insufficiency.  __________  Elwyn Reach patch 10/2020 (PCP): Predominant rhythm of sinus with an average heart rate of 65 bpm (range 54 to 101 bpm), first-degree AV block was noted, occasional PACs representing a 1% burden, and rare PVCs. __________  2D echo 07/2020: 1. Left ventricular ejection  fraction, by estimation, is 55 to 60%. The  left ventricle has normal function. The left ventricle has no regional  wall motion abnormalities. Left ventricular diastolic parameters are  indeterminate.   2. Right ventricular systolic function is normal. The right ventricular  size is normal. Tricuspid regurgitation signal is inadequate for assessing  PA pressure.   3. Left atrial size was mildly dilated.   4. Mild to Moderate circumferential pericardial effusion.   5. The mitral valve leaflets are thickened. Mild to moderate mitral valve  regurgitation. Moderate mitral stenosis. The mean mitral valve gradient is  10.0 mmHg.   6. The inferior vena cava is dilated in size with <50% respiratory  variability, suggesting right atrial pressure of 15 mmHg.  __________  Outpatient cardiac monitoring 05/2020: Normal sinus rhythm avg HR of 64 bpm.    Second Degree AV Block-Mobitz I (Wenckebach) was present (frequent 2-second pauses)   Isolated SVEs were rare (<1.0%), and no SVE Couplets or SVE Triplets were present. Isolated VEs were rare (<1.0%), and no VE Couplets or VE Triplets were present.   No patient triggered events recorded. __________  2D echo 08/2015: - Procedure narrative: Transthoracic echocardiography. Image    quality was poor. The study was technically difficult, as a    result of poor acoustic windows and poor sound wave transmission.  - Left ventricle: The cavity size was normal. Systolic function was    normal. The estimated ejection fraction was in the range of 60%    to 65%. Wall motion was normal; there were no regional wall    motion abnormalities. The study is not technically sufficient to    allow evaluation of LV diastolic function.  - Mitral valve: There was mild regurgitation.  - Left atrium: The atrium was mildly dilated.  - Right ventricle: Systolic function was normal.  - Pulmonary arteries: Systolic pressure was within the normal    range.   Impressions:    - Rhythm is atrial fibrillation.     EKG:  EKG is ordered today.  The EKG ordered today demonstrates NSR, 63 bpm, rare PAC, no acute ST-T changes  Recent Labs: 07/03/2020: TSH 3.590 09/17/2020: ALT 44; BUN 17; Creatinine, Ser  1.03; Sodium 143 11/13/2020: Hemoglobin 11.5; Platelets 299; Potassium 3.9  Recent Lipid Panel    Component Value Date/Time   CHOL 119 09/17/2020 1051   TRIG 58 09/17/2020 1051   HDL 66 09/17/2020 1051   CHOLHDL 1.8 09/17/2020 1051   LDLCALC 40 09/17/2020 1051    PHYSICAL EXAM:    VS:  BP (!) 170/60 (BP Location: Left Arm, Patient Position: Sitting, Cuff Size: Normal)   Pulse 63   Ht 5\' 5"  (1.651 m)   Wt 129 lb 6 oz (58.7 kg)   SpO2 98%   BMI 21.53 kg/m   BMI: Body mass index is 21.53 kg/m.  Physical Exam Vitals reviewed.  Constitutional:      Appearance: She is well-developed.  HENT:     Head: Normocephalic and atraumatic.  Eyes:     General:        Right eye: No discharge.        Left eye: No discharge.  Neck:     Vascular: No JVD.  Cardiovascular:     Rate and Rhythm: Normal rate and regular rhythm.     Pulses:          Posterior tibial pulses are 2+ on the right side and 2+ on the left side.     Heart sounds: S1 normal and S2 normal. Heart sounds not distant. No midsystolic click and no opening snap. Murmur heard.  Low-pitched rumbling crescendo presystolic murmur is present with a grade of 2/4 at the apex.    No friction rub.  Pulmonary:     Effort: Pulmonary effort is normal. No respiratory distress.     Breath sounds: Normal breath sounds. No decreased breath sounds, wheezing or rales.  Chest:     Chest wall: No tenderness.  Abdominal:     General: There is no distension.     Palpations: Abdomen is soft.     Tenderness: There is no abdominal tenderness.  Musculoskeletal:     Cervical back: Normal range of motion.     Right lower leg: No edema.     Left lower leg: No edema.  Skin:    General: Skin is warm and dry.      Nails: There is no clubbing.  Neurological:     Mental Status: She is alert and oriented to person, place, and time.  Psychiatric:        Speech: Speech normal.        Behavior: Behavior normal.        Thought Content: Thought content normal.        Judgment: Judgment normal.    Wt Readings from Last 3 Encounters:  05/09/21 129 lb 6 oz (58.7 kg)  01/01/21 127 lb 9.6 oz (57.9 kg)  12/18/20 125 lb 6 oz (56.9 kg)     ASSESSMENT & PLAN:   Exertional dyspnea with moderate to severe mitral valve stenosis: Schedule R/LHC.  She will need to hold apixaban for 2 days prior to cardiac cath.  This was discussed in detail.  Further recommendations based on results.  Risks and benefits of cardiac catheterization have been discussed with the patient including risks of bleeding, bruising, infection, kidney damage, stroke, heart attack, injury to a limb, and death. The patient understands these risks and is willing to proceed with the procedure. All questions have been answered and concerns listened to.   PAF: Maintaining sinus rhythm.  Continue Cardizem CD.  CHA2DS2-VASc at least 4 (age x 2, vascular disease, sex category).  She remains on renally dosed Eliquis per primary cardiologist.  Carotid artery disease: Patent RICA stent with 40 to 59% bilateral ICA stenosis on most recent imaging.  Followed by vascular surgery.  She is on Eliquis in place of aspirin to minimize bleeding risk.  She remains on rosuvastatin and ezetimibe.  HTN: Blood pressure elevated at 170/60 at triage with recheck improved to 140/68.  Home BP readings are overall reasonably controlled with most readings in the 111N to 356P systolic.  Continue current medical therapy.  HLD: LDL 40 in 08/2020.  She remains on rosuvastatin and ezetimibe.  Chronic dizziness: Extensive cardiac, ENT, and neurologic work-up has been unrevealing.  Felt to be multifactorial in etiology.  Symptoms did improve, though not resolved following stenting of the  RICA.  Recent outpatient cardiac monitoring unrevealing.  Will defer repeat cardiac monitoring at this time.  Her symptoms are overall stable.  Disposition: F/u with Dr. Rockey Situ or an APP in 2 weeks post cath.   Medication Adjustments/Labs and Tests Ordered: Current medicines are reviewed at length with the patient today.  Concerns regarding medicines are outlined above. Medication changes, Labs and Tests ordered today are summarized above and listed in the Patient Instructions accessible in Encounters.   Signed, Christell Faith, PA-C 05/09/2021 1:10 PM     [address] [place], [place] [address] [phone]

## 2021-05-07 NOTE — H&P (View-Only) (Signed)
Cardiology Office Note    Date:  05/09/2021   ID:  Molly, Hodges June 24, 1932, MRN [id number]  PCP:  Birdie Sons, MD  Cardiologist:  Ida Rogue, MD  Electrophysiologist:  None   Chief Complaint: Follow-up  History of Present Illness:   Molly Hodges is a 87 y.o. female with history of PAF status post DCCV in 10/2015, moderate to severe mitral stenosis, pericardial effusion, carotid artery stenosis status post right-sided stenting in 07/2020, asthma, HTN, and chronic dizziness who presents for follow-up of echo.  With regards to her A. fib, she was diagnosed with this in 08/2015 in the context of pneumonia.  She was started on Eliquis and rate controlled.  Echo at that time showed an EF of 60 to 65%, normal wall motion, mild mitral regurgitation, mildly dilated left atrium measuring 40 mm, normal RV systolic function, and normal PASP.  She subsequently underwent a DCCV in 10/2015.  Outpatient cardiac monitoring in 05/2020 showed a predominant rhythm of sinus with an average heart rate of 64 bpm, second-degree AV block type I was present with frequent 2-second pauses predominantly occurring at nighttime.  Echo in 07/2020 showed an EF of 55 to 60%, no regional wall motion abnormalities, indeterminate LV diastolic function parameters, normal RV systolic function and ventricular cavity size, mildly dilated left atrium, mild to moderate circumferential pericardial effusion, mild to moderate mitral regurgitation, moderate mitral stenosis with a mean gradient of 10 mmHg, and an estimated right atrial pressure of 15 mmHg.  Outpatient cardiac monitoring performed through the PCPs office in 10/2020, for chronic dizziness, showed a predominant rhythm of sinus, average heart rate 65 bpm with a range of 54 to 101 bpm, first-degree AV block, and rare PACs and PVCs.  No significant arrhythmias, prolonged pauses, or high-grade AV block were noted.  Most recent carotid artery ultrasound from 11/2020  showed bilateral ICA 40 to 59% stenosis with velocities elevated along the right side felt to possibly be in the setting of prior stenting.  Patent RICA stent was noted.  There was bilateral antegrade flow of the vertebral arteries and normal flow hemodynamics of the bilateral subclavian arteries.  She was evaluated by pulmonology in 11/2020 for ongoing shortness of breath which was not felt to be related to pulmonary disease.  They had suspicion for possible pulmonary hypertension or worsening mitral valve disease.  They made note that the patient did not have COPD.  Echo, as ordered by pulmonology on 12/13/2020, showed an EF of 55 to 60%, no regional wall motion abnormalities, grade 2 diastolic dysfunction, low normal RV systolic function with normal RV cavity size and mildly increased RV wall thickness, moderately dilated left atrium, degenerative mitral valve with no evidence of regurgitation and moderate to severe mitral stenosis with a MVA per PHT of 1.7 cm and a mean gradient of 11 mmHg with a heart rate of 70 bpm, no evidence of pericardial effusion, mild aortic valve insufficiency and mild aortic valve sclerosis without evidence of stenosis, and an estimated right atrial pressure of 8 mmHg.  Given these echo findings, it was recommended she follow-up with [place] today.  She comes in today noting chronic stable dizziness and dyspnea.  No angina, palpitations, presyncope, or syncope.  She does report her dyspnea is improved with nebulizer use.  With regards to her dizziness, she has previously undergone extensive work-up with cardiology, ENT, and neurology.  Home BP log reviewed with readings largely stable in the 120s to 130s for the  most part with an occasional reading in the low 100s and up to low 938B systolic.   Labs independently reviewed: 10/2020 - potassium 3.9, Hgb 11.5, PLT 299 08/2020 - TC 119, TG 58, HDL 66, LDL 40, BUN 17, serum creatinine 1.03, albumin 4.3, AST normal, ALT 44, A1c 6.6 06/2020 -  TSH normal  Past Medical History:  Diagnosis Date   Asthma    Carotid artery disease ([place])    Community acquired pneumonia    Essential hypertension    Glaucoma    Hyperlipidemia    Mitral regurgitation    a. TTE 08/2015: EF 60-65%, normal wall motion, mild MR, mildly dilated left atrium measuring 40 mm, RVSF normal, PASP normal   PAF (paroxysmal atrial fibrillation) ([place]) 09/17/2015   a. s/p DCCV 11/15/2015; CHADS2VASc => 4 (HTN, age x 2, female); c. on Eliquis    Past Surgical History:  Procedure Laterality Date   ABDOMINAL HYSTERECTOMY  1987   due to heavy bleeding   APPENDECTOMY     CAROTID PTA/STENT INTERVENTION Right 08/15/2020   Procedure: CAROTID PTA/STENT INTERVENTION;  Surgeon: Algernon Huxley, MD;  Location: [facility];  Service: Cardiovascular;  Laterality: Right;   CATARACT EXTRACTION     ELECTROPHYSIOLOGIC STUDY N/A 11/15/2015   Procedure: CARDIOVERSION;  Surgeon: Minna Merritts, MD;  Location: [facility];  Service: Cardiovascular;  Laterality: N/A;    Current Medications: Current Meds  Medication Sig   albuterol (PROVENTIL) (2.5 MG/3ML) 0.083% nebulizer solution TAKE 3 MLS (2.5 MG TOTAL) BY NEBULIZATION EVERY 4 HOURS AS NEEDED FORUP TO 12 DOSES FOR WHEEZING OR SHORTNESSOF BREATH   albuterol (VENTOLIN HFA) 108 (90 Base) MCG/ACT inhaler Inhale 2 puffs into the lungs every 6 (six) hours as needed for wheezing or shortness of breath.   amiodarone (PACERONE) 200 MG tablet TAKE ONE TABLET EVERY DAY   apixaban (ELIQUIS) 2.5 MG TABS tablet Take 1 tablet (2.5 mg total) by mouth 2 (two) times daily.   budesonide-formoterol (SYMBICORT) 160-4.5 MCG/ACT inhaler Inhale 2 puffs into the lungs 2 (two) times daily.   Calcium Carbonate-Vitamin D 600-400 MG-UNIT tablet Take 1 tablet by mouth daily.   cholecalciferol (VITAMIN D) 1000 UNITS tablet Take 1,000 Units by mouth daily.   diltiazem (CARDIZEM CD) 180 MG 24 hr capsule TAKE 1 CAPSULE EVERY DAY   dorzolamide (TRUSOPT) 2 %  ophthalmic solution Place 1 drop into both eyes 2 (two) times daily.    ezetimibe (ZETIA) 10 MG tablet Take 1 tablet (10 mg total) by mouth daily.   hydrochlorothiazide (MICROZIDE) 12.5 MG capsule Take 12.5 mg by mouth daily.   ipratropium-albuterol (DUONEB) 0.5-2.5 (3) MG/3ML SOLN Take 3 mLs by nebulization every 4 (four) hours as needed.   latanoprost (XALATAN) 0.005 % ophthalmic solution Place 1 drop into both eyes at bedtime.    levalbuterol (XOPENEX HFA) 45 MCG/ACT inhaler Inhale 2 puffs into the lungs every 4 (four) hours as needed for wheezing.   magnesium oxide (MAG-OX) 400 MG tablet Take 400 mg by mouth daily.   Omega-3 Fatty Acids (FISH OIL BURP-LESS) 1000 MG CAPS Take 1,000 mg by mouth daily.    potassium chloride (KLOR-CON) 10 MEQ tablet Take 10 mEq by mouth daily.   pregabalin (LYRICA) 50 MG capsule TAKE 1 CAPSULE BY MOUTH 2 TIMES DAILY   rosuvastatin (CRESTOR) 20 MG tablet Take 1 tablet (20 mg total) by mouth daily.   vitamin C (ASCORBIC ACID) 500 MG tablet Take 1,000 mg by mouth daily.   vitamin  E 100 UNIT capsule Take 200 Units by mouth daily.   Current Facility-Administered Medications for the 05/09/21 encounter (Office Visit) with Rise Mu, PA-C  Medication   sodium chloride flush (NS) 0.9 % injection 3 mL    Allergies:   Shellfish allergy, Azithromycin, Tamiflu [oseltamivir phosphate], and Albuterol   Social History   Socioeconomic History   Marital status: Married    Spouse name: Not on file   Number of children: 4   Years of education: Not on file   Highest education level: 12th grade  Occupational History   Occupation: retired  Tobacco Use   Smoking status: Never   Smokeless tobacco: Never   Tobacco comments:    never   Vaping Use   Vaping Use: Never used  Substance and Sexual Activity   Alcohol use: No   Drug use: Never   Sexual activity: Not on file  Other Topics Concern   Not on file  Social History Narrative   Lives at home with Husband.  Active and Independent at baseline.   Social Determinants of Health   Financial Resource Strain: Low Risk    Difficulty of Paying Living Expenses: Not hard at all  Food Insecurity: No Food Insecurity   Worried About Charity fundraiser in the Last Year: Never true   [place] in the Last Year: Never true  Transportation Needs: No Transportation Needs   Lack of Transportation (Medical): No   Lack of Transportation (Non-Medical): No  Physical Activity: Inactive   Days of Exercise per Week: 0 days   Minutes of Exercise per Session: 0 min  Stress: No Stress Concern Present   Feeling of Stress : Not at all  Social Connections: Moderately Isolated   Frequency of Communication with Friends and Family: More than three times a week   Frequency of Social Gatherings with Friends and Family: More than three times a week   Attends Religious Services: Never   Marine scientist or Organizations: No   Attends Music therapist: Never   Marital Status: Married     Family History:  The patient's family history includes CAD in her father and mother; Cancer (age of onset: 51) in her son.  ROS:   Review of Systems  Constitutional:  Positive for malaise/fatigue. Negative for chills, diaphoresis, fever and weight loss.  HENT:  Negative for congestion.   Eyes:  Negative for discharge and redness.  Respiratory:  Positive for shortness of breath. Negative for cough, sputum production and wheezing.   Cardiovascular:  Negative for chest pain, palpitations, orthopnea, claudication, leg swelling and PND.  Gastrointestinal:  Negative for abdominal pain, blood in stool, heartburn, melena, nausea and vomiting.  Musculoskeletal:  Negative for falls and myalgias.  Skin:  Negative for rash.  Neurological:  Positive for dizziness. Negative for tingling, tremors, sensory change, speech change, focal weakness, loss of consciousness and weakness.  Endo/Heme/Allergies:  Does not bruise/bleed  easily.  Psychiatric/Behavioral:  Negative for substance abuse. The patient is not nervous/anxious.   All other systems reviewed and are negative.   EKGs/Labs/Other Studies Reviewed:    Studies reviewed were summarized above. The additional studies were reviewed today:  2D echo 12/13/2020: 1. Left ventricular ejection fraction, by estimation, is 55 to 60%. Left  ventricular ejection fraction by 3D volume is 62 %. The left ventricle has  normal function. The left ventricle has no regional wall motion  abnormalities. Left ventricular diastolic   parameters are consistent  with Grade II diastolic dysfunction  (pseudonormalization). The average left ventricular global longitudinal  strain is -14.4 %. The global longitudinal strain is abnormal.   2. Right ventricular systolic function is low normal. The right  ventricular size is normal. Mildly increased right ventricular wall  thickness.   3. Left atrial size was moderately dilated.   4. MVA per PHT is 1.7cm2, mean gradient 55mmHg at HR 70bpm. The mitral  valve is degenerative. No evidence of mitral valve regurgitation. Moderate  to severe mitral stenosis.   5. The aortic valve was not well visualized. Aortic valve regurgitation  is mild. Mild aortic valve sclerosis is present, with no evidence of  aortic valve stenosis.   6. The inferior vena cava is dilated in size with >50% respiratory  variability, suggesting right atrial pressure of 8 mmHg.   Comparison(s): EF 55-60%, mild-moderate pericardial effusion, moderate MR  leaflet thickening, moderate MV stenosis peak gradient 31.5 mmHg, mean  gradient 10.0 mmHG, moderate MR. No evidence of aortic insufficiency.  __________  Elwyn Reach patch 10/2020 (PCP): Predominant rhythm of sinus with an average heart rate of 65 bpm (range 54 to 101 bpm), first-degree AV block was noted, occasional PACs representing a 1% burden, and rare PVCs. __________  2D echo 07/2020: 1. Left ventricular ejection  fraction, by estimation, is 55 to 60%. The  left ventricle has normal function. The left ventricle has no regional  wall motion abnormalities. Left ventricular diastolic parameters are  indeterminate.   2. Right ventricular systolic function is normal. The right ventricular  size is normal. Tricuspid regurgitation signal is inadequate for assessing  PA pressure.   3. Left atrial size was mildly dilated.   4. Mild to Moderate circumferential pericardial effusion.   5. The mitral valve leaflets are thickened. Mild to moderate mitral valve  regurgitation. Moderate mitral stenosis. The mean mitral valve gradient is  10.0 mmHg.   6. The inferior vena cava is dilated in size with <50% respiratory  variability, suggesting right atrial pressure of 15 mmHg.  __________  Outpatient cardiac monitoring 05/2020: Normal sinus rhythm avg HR of 64 bpm.    Second Degree AV Block-Mobitz I (Wenckebach) was present (frequent 2-second pauses)   Isolated SVEs were rare (<1.0%), and no SVE Couplets or SVE Triplets were present. Isolated VEs were rare (<1.0%), and no VE Couplets or VE Triplets were present.   No patient triggered events recorded. __________  2D echo 08/2015: - Procedure narrative: Transthoracic echocardiography. Image    quality was poor. The study was technically difficult, as a    result of poor acoustic windows and poor sound wave transmission.  - Left ventricle: The cavity size was normal. Systolic function was    normal. The estimated ejection fraction was in the range of 60%    to 65%. Wall motion was normal; there were no regional wall    motion abnormalities. The study is not technically sufficient to    allow evaluation of LV diastolic function.  - Mitral valve: There was mild regurgitation.  - Left atrium: The atrium was mildly dilated.  - Right ventricle: Systolic function was normal.  - Pulmonary arteries: Systolic pressure was within the normal    range.   Impressions:    - Rhythm is atrial fibrillation.     EKG:  EKG is ordered today.  The EKG ordered today demonstrates NSR, 63 bpm, rare PAC, no acute ST-T changes  Recent Labs: 07/03/2020: TSH 3.590 09/17/2020: ALT 44; BUN 17; Creatinine, Ser  1.03; Sodium 143 11/13/2020: Hemoglobin 11.5; Platelets 299; Potassium 3.9  Recent Lipid Panel    Component Value Date/Time   CHOL 119 09/17/2020 1051   TRIG 58 09/17/2020 1051   HDL 66 09/17/2020 1051   CHOLHDL 1.8 09/17/2020 1051   LDLCALC 40 09/17/2020 1051    PHYSICAL EXAM:    VS:  BP (!) 170/60 (BP Location: Left Arm, Patient Position: Sitting, Cuff Size: Normal)   Pulse 63   Ht 5\' 5"  (1.651 m)   Wt 129 lb 6 oz (58.7 kg)   SpO2 98%   BMI 21.53 kg/m   BMI: Body mass index is 21.53 kg/m.  Physical Exam Vitals reviewed.  Constitutional:      Appearance: She is well-developed.  HENT:     Head: Normocephalic and atraumatic.  Eyes:     General:        Right eye: No discharge.        Left eye: No discharge.  Neck:     Vascular: No JVD.  Cardiovascular:     Rate and Rhythm: Normal rate and regular rhythm.     Pulses:          Posterior tibial pulses are 2+ on the right side and 2+ on the left side.     Heart sounds: S1 normal and S2 normal. Heart sounds not distant. No midsystolic click and no opening snap. Murmur heard.  Low-pitched rumbling crescendo presystolic murmur is present with a grade of 2/4 at the apex.    No friction rub.  Pulmonary:     Effort: Pulmonary effort is normal. No respiratory distress.     Breath sounds: Normal breath sounds. No decreased breath sounds, wheezing or rales.  Chest:     Chest wall: No tenderness.  Abdominal:     General: There is no distension.     Palpations: Abdomen is soft.     Tenderness: There is no abdominal tenderness.  Musculoskeletal:     Cervical back: Normal range of motion.     Right lower leg: No edema.     Left lower leg: No edema.  Skin:    General: Skin is warm and dry.      Nails: There is no clubbing.  Neurological:     Mental Status: She is alert and oriented to person, place, and time.  Psychiatric:        Speech: Speech normal.        Behavior: Behavior normal.        Thought Content: Thought content normal.        Judgment: Judgment normal.    Wt Readings from Last 3 Encounters:  05/09/21 129 lb 6 oz (58.7 kg)  01/01/21 127 lb 9.6 oz (57.9 kg)  12/18/20 125 lb 6 oz (56.9 kg)     ASSESSMENT & PLAN:   Exertional dyspnea with moderate to severe mitral valve stenosis: Schedule R/LHC.  She will need to hold apixaban for 2 days prior to cardiac cath.  This was discussed in detail.  Further recommendations based on results.  Risks and benefits of cardiac catheterization have been discussed with the patient including risks of bleeding, bruising, infection, kidney damage, stroke, heart attack, injury to a limb, and death. The patient understands these risks and is willing to proceed with the procedure. All questions have been answered and concerns listened to.   PAF: Maintaining sinus rhythm.  Continue Cardizem CD.  CHA2DS2-VASc at least 4 (age x 2, vascular disease, sex category).  She remains on renally dosed Eliquis per primary cardiologist.  Carotid artery disease: Patent RICA stent with 40 to 59% bilateral ICA stenosis on most recent imaging.  Followed by vascular surgery.  She is on Eliquis in place of aspirin to minimize bleeding risk.  She remains on rosuvastatin and ezetimibe.  HTN: Blood pressure elevated at 170/60 at triage with recheck improved to 140/68.  Home BP readings are overall reasonably controlled with most readings in the 543K to 067P systolic.  Continue current medical therapy.  HLD: LDL 40 in 08/2020.  She remains on rosuvastatin and ezetimibe.  Chronic dizziness: Extensive cardiac, ENT, and neurologic work-up has been unrevealing.  Felt to be multifactorial in etiology.  Symptoms did improve, though not resolved following stenting of the  RICA.  Recent outpatient cardiac monitoring unrevealing.  Will defer repeat cardiac monitoring at this time.  Her symptoms are overall stable.  Disposition: F/u with Dr. Rockey Situ or an APP in 2 weeks post cath.   Medication Adjustments/Labs and Tests Ordered: Current medicines are reviewed at length with the patient today.  Concerns regarding medicines are outlined above. Medication changes, Labs and Tests ordered today are summarized above and listed in the Patient Instructions accessible in Encounters.   Signed, Christell Faith, PA-C 05/09/2021 1:10 PM     [address] [place], [place] [address] [phone]

## 2021-05-09 ENCOUNTER — Encounter: Payer: Self-pay | Admitting: Physician Assistant

## 2021-05-09 ENCOUNTER — Other Ambulatory Visit: Payer: Self-pay

## 2021-05-09 ENCOUNTER — Ambulatory Visit: Payer: Medicare Other | Admitting: Physician Assistant

## 2021-05-09 VITALS — BP 170/60 | HR 63 | Ht 65.0 in | Wt 129.4 lb

## 2021-05-09 DIAGNOSIS — R42 Dizziness and giddiness: Secondary | ICD-10-CM

## 2021-05-09 DIAGNOSIS — I10 Essential (primary) hypertension: Secondary | ICD-10-CM | POA: Diagnosis not present

## 2021-05-09 DIAGNOSIS — R0609 Other forms of dyspnea: Secondary | ICD-10-CM

## 2021-05-09 DIAGNOSIS — I6521 Occlusion and stenosis of right carotid artery: Secondary | ICD-10-CM

## 2021-05-09 DIAGNOSIS — E785 Hyperlipidemia, unspecified: Secondary | ICD-10-CM | POA: Diagnosis not present

## 2021-05-09 DIAGNOSIS — R0600 Dyspnea, unspecified: Secondary | ICD-10-CM

## 2021-05-09 DIAGNOSIS — I050 Rheumatic mitral stenosis: Secondary | ICD-10-CM | POA: Diagnosis not present

## 2021-05-09 DIAGNOSIS — I480 Paroxysmal atrial fibrillation: Secondary | ICD-10-CM

## 2021-05-09 DIAGNOSIS — I340 Nonrheumatic mitral (valve) insufficiency: Secondary | ICD-10-CM | POA: Diagnosis not present

## 2021-05-09 MED ORDER — SODIUM CHLORIDE 0.9% FLUSH: 3.0000 mL | Freq: Two times a day (BID) | INTRAVENOUS | Status: DC

## 2021-05-09 NOTE — Patient Instructions (Signed)
Medication Instructions:  Your physician recommends that you continue on your current medications as directed. Please refer to the Current Medication list given to you today.  *If you need a refill on your cardiac medications before your next appointment, please call your pharmacy*   Lab Work: TODAY:  BMET & CBC  If you have labs (blood work) drawn today and your tests are completely normal, you will receive your results only by: [place] (if you have MyChart) OR A paper copy in the mail If you have any lab test that is abnormal or we need to change your treatment, we will call you to review the results.   Testing/Procedures: Your physician has requested that you have a cardiac catheterization. Cardiac catheterization is used to diagnose and/or treat various heart conditions. Doctors may recommend this procedure for a number of different reasons. The most common reason is to evaluate chest pain. Chest pain can be a symptom of coronary artery disease (CAD), and cardiac catheterization can show whether plaque is narrowing or blocking your heart's arteries. This procedure is also used to evaluate the valves, as well as measure the blood flow and oxygen levels in different parts of your heart. For further information please visit [email or web address]. Please follow instruction sheet,  BELOW:   [address], [place] [address] Dept: [phone] Loc: [place]  05/09/2021  You are scheduled for a Cardiac Catheterization on Thursday, September 29 with Dr. Ida Rogue.  1. Please arrive [place] at 8:30 AM (This time is one hours before your procedure to ensure your preparation).   Special note: Every effort is made to have your procedure done on time. Please understand that emergencies sometimes delay scheduled procedures.  2.  Diet: Do not eat solid foods after midnight.  The patient may have clear liquids until 5am upon the day of the procedure.  3. Labs: WILL BE TODAY   4. Medication instructions in preparation for your procedure:   Contrast Allergy: No  *For reference purposes while preparing patient instructions.   Delete this med list prior to printing instructions for patient.*  Stop taking Eliquis (Apixiban) on Monday, September 26. THE NIGHT TIME DOSE ON THIS DAY WILL BE YOUR LAST UNTIL AFTER YOUR CATH   Stop taking, HTCZ (Hydrochlorothiazide) Thursday, September 29, You can take it once your procedure is done    On the morning of your procedure, take your Aspirin and any morning medicines NOT listed above.  You may use sips of water.  5. Plan for one night stay--bring personal belongings. 6. Bring a current list of your medications and current insurance cards. 7. You MUST have a responsible person to drive you home. 8. Someone MUST be with you the first 24 hours after you arrive home or your discharge will be delayed. 9. Please wear clothes that are easy to get on and off and wear slip-on shoes.  Thank you for allowing [place] to care for you!   -- [facility]     Follow-Up: At [facility], you and your health needs are our priority.  As part of our continuing mission to provide you with exceptional heart care, we have created designated Provider Care Teams.  These Care Teams include your primary Cardiologist (physician) and Advanced Practice Providers (APPs -  Physician Assistants and Nurse Practitioners) who all work together to provide you with the care  you need, when you need it.  We recommend signing up for the patient portal called "MyChart".  Sign up information is provided on this After Visit Summary.  MyChart is used to connect with patients for Virtual Visits (Telemedicine).  Patients are able to view lab/test results, encounter notes, upcoming  appointments, etc.  Non-urgent messages can be sent to your provider as well.   To learn more about what you can do with MyChart, go to [email or web address].    Your next appointment:   2 WEEKS AFTER 05/16/21    The format for your next appointment:   In Person  Provider:   You may see Ida Rogue, MD or one of the following Advanced Practice Providers on your designated Care Team:     Christell Faith, [place]     Other Instructions

## 2021-05-10 LAB — CBC
Hematocrit: 41.2 % (ref 34.0–46.6)
Hemoglobin: 12.7 g/dL (ref 11.1–15.9)
MCH: 25.5 pg — ABNORMAL LOW (ref 26.6–33.0)
MCHC: 30.8 g/dL — ABNORMAL LOW (ref 31.5–35.7)
MCV: 83 fL (ref 79–97)
Platelets: 225 10*3/uL (ref 150–450)
RBC: 4.99 x10E6/uL (ref 3.77–5.28)
RDW: 13.9 % (ref 11.7–15.4)
WBC: 8.5 10*3/uL (ref 3.4–10.8)

## 2021-05-10 LAB — BASIC METABOLIC PANEL
BUN/Creatinine Ratio: 16 (ref 12–28)
BUN: 14 mg/dL (ref 8–27)
CO2: 24 mmol/L (ref 20–29)
Calcium: 8.9 mg/dL (ref 8.7–10.3)
Chloride: 100 mmol/L (ref 96–106)
Creatinine, Ser: 0.85 mg/dL (ref 0.57–1.00)
Glucose: 92 mg/dL (ref 65–99)
Potassium: 4.3 mmol/L (ref 3.5–5.2)
Sodium: 137 mmol/L (ref 134–144)
eGFR: 66 mL/min/{1.73_m2} (ref >59)

## 2021-05-15 ENCOUNTER — Telehealth: Payer: Self-pay | Admitting: Physician Assistant

## 2021-05-15 ENCOUNTER — Other Ambulatory Visit: Payer: Self-pay | Admitting: Physician Assistant

## 2021-05-15 NOTE — Telephone Encounter (Signed)
Spoke with patient and reviewed that unfortunately we needed to reschedule her procedure from tomorrow. Rescheduled her to Friday with arrival time of 06:30 with Dr. Saunders [name]. She was very upset and flustered with these changes. While she was upset she expressed that sometimes these things happen and is not upset with anyone and is very happy with the care she receives here in our office. Provided emotional support and understanding to her and she was very thankful for our kindness. She was agreeable with time and date change. She verbalized understanding to follow instructions she was provided at her visit. She verbalized understanding with no further questions at this time.

## 2021-05-16 ENCOUNTER — Other Ambulatory Visit: Payer: Self-pay | Admitting: Cardiovascular Disease

## 2021-05-16 DIAGNOSIS — R0600 Dyspnea, unspecified: Secondary | ICD-10-CM

## 2021-05-16 NOTE — Telephone Encounter (Signed)
Please advise if ok to refill Potassium 10 meq qd. Last filled by Historical Provider.

## 2021-05-17 ENCOUNTER — Ambulatory Visit
Admission: RE | Admit: 2021-05-17 | Discharge: 2021-05-17 | Disposition: A | Discharge status: Home / Self Care | Payer: Medicare Other | Source: Ambulatory Visit | Attending: Internal Medicine | Admitting: Internal Medicine

## 2021-05-17 ENCOUNTER — Encounter
Admission: RE | Disposition: A | Discharge status: Home / Self Care | Payer: Self-pay | Source: Ambulatory Visit | Attending: Internal Medicine

## 2021-05-17 ENCOUNTER — Encounter: Payer: Self-pay | Admitting: Internal Medicine

## 2021-05-17 DIAGNOSIS — Z79899 Other long term (current) drug therapy: Secondary | ICD-10-CM | POA: Insufficient documentation

## 2021-05-17 DIAGNOSIS — I6523 Occlusion and stenosis of bilateral carotid arteries: Secondary | ICD-10-CM | POA: Diagnosis not present

## 2021-05-17 DIAGNOSIS — I10 Essential (primary) hypertension: Secondary | ICD-10-CM | POA: Insufficient documentation

## 2021-05-17 DIAGNOSIS — Z881 Allergy status to other antibiotic agents status: Secondary | ICD-10-CM | POA: Diagnosis not present

## 2021-05-17 DIAGNOSIS — I2510 Atherosclerotic heart disease of native coronary artery without angina pectoris: Secondary | ICD-10-CM | POA: Diagnosis not present

## 2021-05-17 DIAGNOSIS — R0609 Other forms of dyspnea: Secondary | ICD-10-CM

## 2021-05-17 DIAGNOSIS — Z888 Allergy status to other drugs, medicaments and biological substances status: Secondary | ICD-10-CM | POA: Diagnosis not present

## 2021-05-17 DIAGNOSIS — E785 Hyperlipidemia, unspecified: Secondary | ICD-10-CM | POA: Insufficient documentation

## 2021-05-17 DIAGNOSIS — Z7901 Long term (current) use of anticoagulants: Secondary | ICD-10-CM | POA: Insufficient documentation

## 2021-05-17 DIAGNOSIS — R42 Dizziness and giddiness: Secondary | ICD-10-CM | POA: Insufficient documentation

## 2021-05-17 DIAGNOSIS — R0600 Dyspnea, unspecified: Secondary | ICD-10-CM | POA: Diagnosis not present

## 2021-05-17 DIAGNOSIS — I050 Rheumatic mitral stenosis: Secondary | ICD-10-CM | POA: Diagnosis not present

## 2021-05-17 DIAGNOSIS — Z91013 Allergy to seafood: Secondary | ICD-10-CM | POA: Diagnosis not present

## 2021-05-17 DIAGNOSIS — Z7951 Long term (current) use of inhaled steroids: Secondary | ICD-10-CM | POA: Insufficient documentation

## 2021-05-17 DIAGNOSIS — I480 Paroxysmal atrial fibrillation: Secondary | ICD-10-CM | POA: Diagnosis not present

## 2021-05-17 DIAGNOSIS — Z8249 Family history of ischemic heart disease and other diseases of the circulatory system: Secondary | ICD-10-CM | POA: Insufficient documentation

## 2021-05-17 DIAGNOSIS — I342 Nonrheumatic mitral (valve) stenosis: Secondary | ICD-10-CM | POA: Insufficient documentation

## 2021-05-17 HISTORY — PX: RIGHT/LEFT HEART CATH AND CORONARY ANGIOGRAPHY: CATH118266

## 2021-05-17 SURGERY — RIGHT/LEFT HEART CATH AND CORONARY ANGIOGRAPHY
Anesthesia: Moderate Sedation

## 2021-05-17 MED ORDER — SODIUM CHLORIDE 0.9% FLUSH: 3.0000 mL | INTRAVENOUS | Status: DC | PRN

## 2021-05-17 MED ORDER — HEPARIN SODIUM (PORCINE) 1000 UNIT/ML IJ SOLN
INTRAMUSCULAR | Status: AC
  Filled 2021-05-17: qty 1

## 2021-05-17 MED ORDER — HEPARIN SODIUM (PORCINE) 1000 UNIT/ML IJ SOLN
INTRAMUSCULAR | Status: DC | PRN
  Administered 2021-05-17: 3000 [IU] via INTRAVENOUS

## 2021-05-17 MED ORDER — SODIUM CHLORIDE 0.9 % IV SOLN: 250.0000 mL | INTRAVENOUS | Status: DC | PRN

## 2021-05-17 MED ORDER — VERAPAMIL HCL 2.5 MG/ML IV SOLN
INTRAVENOUS | Status: AC
  Filled 2021-05-17: qty 2

## 2021-05-17 MED ORDER — NITROGLYCERIN 1 MG/10 ML FOR IR/CATH LAB
INTRA_ARTERIAL | Status: DC | PRN
  Administered 2021-05-17: 200 ug via INTRACORONARY

## 2021-05-17 MED ORDER — NITROGLYCERIN 0.4 MG SL SUBL: 0.4000 mg | SUBLINGUAL_TABLET | SUBLINGUAL | 3 refills | Status: DC | PRN

## 2021-05-17 MED ORDER — LIDOCAINE HCL 1 % IJ SOLN
INTRAMUSCULAR | Status: AC
  Filled 2021-05-17: qty 20

## 2021-05-17 MED ORDER — MIDAZOLAM HCL 2 MG/2ML IJ SOLN
INTRAMUSCULAR | Status: DC | PRN
  Administered 2021-05-17: .5 mg via INTRAVENOUS

## 2021-05-17 MED ORDER — VERAPAMIL HCL 2.5 MG/ML IV SOLN
INTRAVENOUS | Status: DC | PRN
  Administered 2021-05-17: 2.5 mg via INTRAVENOUS

## 2021-05-17 MED ORDER — HEPARIN (PORCINE) IN NACL 1000-0.9 UT/500ML-% IV SOLN
INTRAVENOUS | Status: AC
  Filled 2021-05-17: qty 1000

## 2021-05-17 MED ORDER — FENTANYL CITRATE (PF) 100 MCG/2ML IJ SOLN
INTRAMUSCULAR | Status: DC | PRN
  Administered 2021-05-17: 12.5 ug via INTRAVENOUS

## 2021-05-17 MED ORDER — HYDRALAZINE HCL 20 MG/ML IJ SOLN: 10.0000 mg | INTRAMUSCULAR | Status: DC | PRN

## 2021-05-17 MED ORDER — HEPARIN (PORCINE) IN NACL 2000-0.9 UNIT/L-% IV SOLN
INTRAVENOUS | Status: DC | PRN
  Administered 2021-05-17: 1000 mL

## 2021-05-17 MED ORDER — FUROSEMIDE 40 MG PO TABS: 40.0000 mg | ORAL_TABLET | Freq: Every day | ORAL | 5 refills | Status: DC

## 2021-05-17 MED ORDER — IOHEXOL 350 MG/ML SOLN
INTRAVENOUS | Status: DC | PRN
  Administered 2021-05-17: 40 mL via INTRA_ARTERIAL

## 2021-05-17 MED ORDER — LIDOCAINE HCL (PF) 1 % IJ SOLN
INTRAMUSCULAR | Status: DC | PRN
  Administered 2021-05-17: 5 mL

## 2021-05-17 MED ORDER — MIDAZOLAM HCL 2 MG/2ML IJ SOLN
INTRAMUSCULAR | Status: AC
  Filled 2021-05-17: qty 2

## 2021-05-17 MED ORDER — ONDANSETRON HCL 4 MG/2ML IJ SOLN: 4.0000 mg | Freq: Four times a day (QID) | INTRAMUSCULAR | Status: DC | PRN

## 2021-05-17 MED ORDER — SODIUM CHLORIDE 0.9% FLUSH: 3.0000 mL | Freq: Two times a day (BID) | INTRAVENOUS | Status: DC

## 2021-05-17 MED ORDER — FENTANYL CITRATE (PF) 100 MCG/2ML IJ SOLN
INTRAMUSCULAR | Status: AC
  Filled 2021-05-17: qty 2

## 2021-05-17 MED ORDER — LABETALOL HCL 5 MG/ML IV SOLN: 10.0000 mg | INTRAVENOUS | Status: DC | PRN

## 2021-05-17 MED ORDER — SODIUM CHLORIDE 0.9 % IV SOLN
INTRAVENOUS | Status: DC
  Administered 2021-05-17: 1000 mL via INTRAVENOUS

## 2021-05-17 MED ORDER — ACETAMINOPHEN 325 MG PO TABS: 650.0000 mg | ORAL_TABLET | ORAL | Status: DC | PRN

## 2021-05-17 SURGICAL SUPPLY — 16 items
CATH BALLN WEDGE 5F 110CM (CATHETERS) ×2 IMPLANT
CATH INFINITI 5 FR JL3.5 (CATHETERS) ×2 IMPLANT
CATH INFINITI JR4 5F (CATHETERS) ×2 IMPLANT
DEVICE RAD TR BAND REGULAR (VASCULAR PRODUCTS) ×2 IMPLANT
DRAPE BRACHIAL (DRAPES) ×4 IMPLANT
GLIDESHEATH SLEND SS 6F .021 (SHEATH) ×2 IMPLANT
GUIDEWIRE .025 260CM (WIRE) ×2 IMPLANT
GUIDEWIRE INQWIRE 1.5J.035X260 (WIRE) ×1 IMPLANT
INQWIRE 1.5J .035X260CM (WIRE) ×2
KIT RIGHT HEART (MISCELLANEOUS) ×2 IMPLANT
PACK CARDIAC CATH (CUSTOM PROCEDURE TRAY) ×2 IMPLANT
PROTECTION STATION PRESSURIZED (MISCELLANEOUS) ×2
SET ATX SIMPLICITY (MISCELLANEOUS) ×2 IMPLANT
SHEATH GLIDE SLENDER 4/5FR (SHEATH) ×2 IMPLANT
STATION PROTECTION PRESSURIZED (MISCELLANEOUS) ×1 IMPLANT
WIRE HITORQ VERSACORE ST 145CM (WIRE) ×2 IMPLANT

## 2021-05-17 NOTE — Interval H&P Note (Signed)
History and Physical Interval Note:  05/17/2021 7:37 AM  Elmon Kirschner Arment  has presented today for surgery, with the diagnosis of mitral stenosis, exertional dyspnea, and dizziness.  The various methods of treatment have been discussed with the patient and family. After consideration of risks, benefits and other options for treatment, the patient has consented to  Procedure(s): RIGHT/LEFT HEART CATH AND CORONARY ANGIOGRAPHY (N/A) as a surgical intervention.  The patient's history has been reviewed, patient examined, no change in status, stable for surgery.  I have reviewed the patient's chart and labs.  Questions were answered to the patient's satisfaction.    Cath Lab Visit (complete for each Cath Lab visit)  Clinical Evaluation Leading to the Procedure:   ACS: No.  Non-ACS:    Anginal Classification: NYHA class III  Anti-ischemic medical therapy: Minimal Therapy (1 class of medications)  Non-Invasive Test Results: No non-invasive testing performed  Prior CABG: No previous CABG  Cameryn Schum

## 2021-05-21 DIAGNOSIS — R0902 Hypoxemia: Secondary | ICD-10-CM | POA: Diagnosis not present

## 2021-05-29 ENCOUNTER — Other Ambulatory Visit: Payer: Self-pay

## 2021-05-29 ENCOUNTER — Ambulatory Visit (INDEPENDENT_AMBULATORY_CARE_PROVIDER_SITE_OTHER): Payer: Medicare Other

## 2021-05-29 DIAGNOSIS — Z23 Encounter for immunization: Secondary | ICD-10-CM | POA: Diagnosis not present

## 2021-05-29 NOTE — Progress Notes (Signed)
Cardiology Office Note    Date:  05/30/2021   ID:  Molly Hodges, DOB 01-29-1932, MRN [id number]  PCP:  Molly Sons, MD  Cardiologist:  None  Electrophysiologist:  None   Chief Complaint: Follow-up  History of Present Illness:   Molly Hodges is a 87 y.o. female with history of recently diagnosed CAD by LHC as outlined below, PAF status post DCCV in 10/2015, moderate to severe mitral stenosis, pericardial effusion, pulmonary hypertension, carotid artery stenosis status post right-sided stenting in 07/2020, asthma, HTN, and chronic dizziness who presents for follow-up of recent diagnostic R/LHC.   With regards to her A. fib, she was diagnosed with this in 08/2015 in the context of pneumonia.  She was started on Eliquis and rate controlled.  Echo at that time showed an EF of 60 to 65%, normal wall motion, mild mitral regurgitation, mildly dilated left atrium measuring 40 mm, normal RV systolic function, and normal PASP.  She subsequently underwent a DCCV in 10/2015.  Outpatient cardiac monitoring in 05/2020 showed a predominant rhythm of sinus with an average heart rate of 64 bpm, second-degree AV block type I was present with frequent 2-second pauses predominantly occurring at nighttime.  Echo in 07/2020 showed an EF of 55 to 60%, no regional wall motion abnormalities, indeterminate LV diastolic function parameters, normal RV systolic function and ventricular cavity size, mildly dilated left atrium, mild to moderate circumferential pericardial effusion, mild to moderate mitral regurgitation, moderate mitral stenosis with a mean gradient of 10 mmHg, and an estimated right atrial pressure of 15 mmHg.  Outpatient cardiac monitoring performed through the PCPs office in 10/2020, for chronic dizziness, showed a predominant rhythm of sinus, average heart rate 65 bpm with a range of 54 to 101 bpm, first-degree AV block, and rare PACs and PVCs.  No significant arrhythmias, prolonged pauses, or  high-grade AV block were noted.  Most recent carotid artery ultrasound from 11/2020 showed bilateral ICA 40 to 59% stenosis with velocities elevated along the right side felt to possibly be in the setting of prior stenting.  Patent RICA stent was noted.  There was bilateral antegrade flow of the vertebral arteries and normal flow hemodynamics of the bilateral subclavian arteries.  She was evaluated by pulmonology in 11/2020 for ongoing shortness of breath which was not felt to be related to pulmonary disease.  They had suspicion for possible pulmonary hypertension or worsening mitral valve disease.  They made note that the patient did not have COPD.  Echo, as ordered by pulmonology on 12/13/2020, showed an EF of 55 to 60%, no regional wall motion abnormalities, grade 2 diastolic dysfunction, low normal RV systolic function with normal RV cavity size and mildly increased RV wall thickness, moderately dilated left atrium, degenerative mitral valve with no evidence of regurgitation and moderate to severe mitral stenosis with a MVA per PHT of 1.7 cm and a mean gradient of 11 mmHg with a heart rate of 70 bpm, no evidence of pericardial effusion, mild aortic valve insufficiency and mild aortic valve sclerosis without evidence of stenosis, and an estimated right atrial pressure of 8 mmHg.  She was last seen in the office on 05/09/2021 and continued to note chronic stable dizziness and dyspnea.  Given persistent dyspnea, she underwent R/LHC on 05/17/2021 which demonstrated severe single-vessel CAD with 80 to 90% proximal/mid LAD stenosis involving a large D1 branch.  The D1 also contained an 80% stenosis in its proximal segment.  No significant disease was seen in  ramus intermedius, LCx, and RCA.  Mildly elevated right heart and left ventricular pressures.  Moderately to severely elevated pulmonary artery pressure with a mean PA pressure 42 mmHg with elevated pulmonary vascular resistance.  There was severe mitral valve  stenosis which may be extenuated by at least moderate mitral regurgitation based on PCWP tracing as outlined below.  The case was discussed with her primary cardiologist with recommendation to refer the patient to cardiothoracic surgery for further evaluation of potential mitral valve intervention and CABG.  There was also recommended to consider a TEE for further evaluation of her mitral valve disease.  She was started on furosemide 40 mg daily.  She comes in today accompanied by her daughter and is doing well from a cardiac perspective.  Since initiating Lasix, she notes an improvement in her dyspnea.  She also notes an improvement in her dizziness.  Her weight remains stable.  She has noted an increase in urine volume on loop diuretic.  No angina, lower extremity swelling, orthopnea, PND, or early satiety.  She does continue to note chronic stable fatigue.  Blood pressure is well controlled.  She is eager to get further input regarding management of her CAD and mitral valve moving forward.  Otherwise, she does not have any issues or concerns at this time.  Labs independently reviewed: 04/2021 - Hgb 12.7, PLT 225, BUN 14, serum creatinine 0.85, potassium 4.3 08/2020 - TC 119, TG 58, HDL 66, LDL 40, albumin 4.3, AST normal, ALT 44, A1c 6.6 06/2020 - TSH normal  Past Medical History:  Diagnosis Date   Asthma    Carotid artery disease ([place])    Community acquired pneumonia    Essential hypertension    Glaucoma    Hyperlipidemia    Mitral regurgitation    a. TTE 08/2015: EF 60-65%, normal wall motion, mild MR, mildly dilated left atrium measuring 40 mm, RVSF normal, PASP normal   PAF (paroxysmal atrial fibrillation) ([place]) 09/17/2015   a. s/p DCCV 11/15/2015; CHADS2VASc => 4 (HTN, age x 2, female); c. on Eliquis    Past Surgical History:  Procedure Laterality Date   ABDOMINAL HYSTERECTOMY  1987   due to heavy bleeding   APPENDECTOMY     CAROTID PTA/STENT INTERVENTION Right 08/15/2020    Procedure: CAROTID PTA/STENT INTERVENTION;  Surgeon: Algernon Huxley, MD;  Location: [facility];  Service: Cardiovascular;  Laterality: Right;   CATARACT EXTRACTION     ELECTROPHYSIOLOGIC STUDY N/A 11/15/2015   Procedure: CARDIOVERSION;  Surgeon: Minna Merritts, MD;  Location: [facility];  Service: Cardiovascular;  Laterality: N/A;   RIGHT/LEFT HEART CATH AND CORONARY ANGIOGRAPHY N/A 05/17/2021   Procedure: RIGHT/LEFT HEART CATH AND CORONARY ANGIOGRAPHY;  Surgeon: Nelva Bush, MD;  Location: [facility];  Service: Cardiovascular;  Laterality: N/A;    Current Medications: Current Meds  Medication Sig   albuterol (PROVENTIL) (2.5 MG/3ML) 0.083% nebulizer solution TAKE 3 MLS (2.5 MG TOTAL) BY NEBULIZATION EVERY 4 HOURS AS NEEDED FORUP TO 12 DOSES FOR WHEEZING OR SHORTNESSOF BREATH   albuterol (VENTOLIN HFA) 108 (90 Base) MCG/ACT inhaler Inhale 2 puffs into the lungs every 6 (six) hours as needed for wheezing or shortness of breath.   amiodarone (PACERONE) 200 MG tablet TAKE ONE TABLET EVERY DAY   apixaban (ELIQUIS) 2.5 MG TABS tablet Take 1 tablet (2.5 mg total) by mouth 2 (two) times daily.   budesonide-formoterol (SYMBICORT) 160-4.5 MCG/ACT inhaler Inhale 2 puffs into the lungs 2 (two) times daily. (Patient taking  differently: Inhale 2 puffs into the lungs 2 (two) times daily as needed (asthma).)   Calcium Carbonate-Vitamin D 600-400 MG-UNIT tablet Take 1 tablet by mouth daily.   cholecalciferol (VITAMIN D) 1000 UNITS tablet Take 1,000 Units by mouth daily.   diltiazem (CARDIZEM CD) 180 MG 24 hr capsule TAKE 1 CAPSULE EVERY DAY   dorzolamide (TRUSOPT) 2 % ophthalmic solution Place 1 drop into both eyes 2 (two) times daily.    ezetimibe (ZETIA) 10 MG tablet Take 1 tablet (10 mg total) by mouth daily.   furosemide (LASIX) 40 MG tablet Take 1 tablet (40 mg total) by mouth daily.   ipratropium-albuterol (DUONEB) 0.5-2.5 (3) MG/3ML SOLN Take 3 mLs by nebulization every 4 (four)  hours as needed.   latanoprost (XALATAN) 0.005 % ophthalmic solution Place 1 drop into both eyes at bedtime.    magnesium oxide (MAG-OX) 400 MG tablet Take 400 mg by mouth daily.   Multiple Vitamins-Minerals (PRESERVISION AREDS 2+MULTI VIT) CAPS Take 1 capsule by mouth in the morning and at bedtime.   nitroGLYCERIN (NITROSTAT) 0.4 MG SL tablet Place 1 tablet (0.4 mg total) under the tongue every 5 (five) minutes as needed (for chest pain or shortness of breath).   Omega-3 Fatty Acids (FISH OIL BURP-LESS) 1000 MG CAPS Take 1,000 mg by mouth daily.    potassium chloride (KLOR-CON) 10 MEQ tablet TAKE 1 TABLET BY MOUTH DAILY   pregabalin (LYRICA) 50 MG capsule TAKE 1 CAPSULE BY MOUTH 2 TIMES DAILY   rosuvastatin (CRESTOR) 20 MG tablet Take 1 tablet (20 mg total) by mouth daily.   vitamin C (ASCORBIC ACID) 500 MG tablet Take 500 mg by mouth daily.   vitamin E 100 UNIT capsule Take 200 Units by mouth daily.   warfarin (COUMADIN) 5 MG tablet Take 1 tablet (5 mg total) by mouth daily. Start 10/22, take with Eliquis. See Coumadin nurse 10/26   Current Facility-Administered Medications for the 05/30/21 encounter (Office Visit) with Rise Mu, PA-C  Medication   sodium chloride flush (NS) 0.9 % injection 3 mL    Allergies:   Shellfish allergy, Azithromycin, Tamiflu [oseltamivir phosphate], and Albuterol   Social History   Socioeconomic History   Marital status: Married    Spouse name: Not on file   Number of children: 4   Years of education: Not on file   Highest education level: 12th grade  Occupational History   Occupation: retired  Tobacco Use   Smoking status: Never   Smokeless tobacco: Never   Tobacco comments:    never   Vaping Use   Vaping Use: Never used  Substance and Sexual Activity   Alcohol use: No   Drug use: Never   Sexual activity: Not on file  Other Topics Concern   Not on file  Social History Narrative   Lives at home with Husband. Active and Independent at  baseline.   Social Determinants of Health   Financial Resource Strain: Low Risk    Difficulty of Paying Living Expenses: Not hard at all  Food Insecurity: No Food Insecurity   Worried About Charity fundraiser in the Last Year: Never true   [place] in the Last Year: Never true  Transportation Needs: No Transportation Needs   Lack of Transportation (Medical): No   Lack of Transportation (Non-Medical): No  Physical Activity: Inactive   Days of Exercise per Week: 0 days   Minutes of Exercise per Session: 0 min  Stress: No Stress Concern  Present   Feeling of Stress : Not at all  Social Connections: Moderately Isolated   Frequency of Communication with Friends and Family: More than three times a week   Frequency of Social Gatherings with Friends and Family: More than three times a week   Attends Religious Services: Never   Marine scientist or Organizations: No   Attends Music therapist: Never   Marital Status: Married     Family History:  The patient's family history includes CAD in her father and mother; Cancer (age of onset: 29) in her son.  ROS:   ROS   EKGs/Labs/Other Studies Reviewed:    Studies reviewed were summarized above. The additional studies were reviewed today:  [facility] 05/17/2021: Conclusions: Severe single-vessel coronary artery disease with 80-90% proximal/mid LAD stenosis involving large D1 branch.  D1 also contains an 80% stenosis in its proximal segment.  No significant disease seen in ramus intermedius, LCx, and RCA. Mildly elevated right heart and left ventricular pressures (mean RA 11 mmHg, LVEDP 20 mmHg). Moderately-severely elevated pulmonary artery pressure (mean PA 42 mmHg) with elevated pulmonary vascular resistance (PVR 4.7 WU). Severe mitral valve stenosis, which may be accentuated by at least moderate mitral regurgitation based on PCWP tracing) mean gradient 13-17 mmHg, MVA 0.5-0.6 cm).   Recommendations: Consider TEE  for further evaluation of mitral valve disease. Case discussed with Dr. Rockey Situ.  We have agreed to refer the patient to cardiac surgery for further evaluation for potential mitral valve intervention and CABG. Add furosemide 40 mg p.o. daily.  Basic metabolic panel should be checked when the patient follows up in the office in 2 weeks. Aggressive secondary prevention of coronary artery disease. Restart apixaban 2.5 mg twice daily tomorrow morning if no evidence of bleeding/vascular injury.  Given greater than moderate mitral stenosis, transition to warfarin should be considered in the future in the setting of valvular atrial fibrillation. __________  2D echo 12/13/2020: 1. Left ventricular ejection fraction, by estimation, is 55 to 60%. Left  ventricular ejection fraction by 3D volume is 62 %. The left ventricle has  normal function. The left ventricle has no regional wall motion  abnormalities. Left ventricular diastolic   parameters are consistent with Grade II diastolic dysfunction  (pseudonormalization). The average left ventricular global longitudinal  strain is -14.4 %. The global longitudinal strain is abnormal.   2. Right ventricular systolic function is low normal. The right  ventricular size is normal. Mildly increased right ventricular wall  thickness.   3. Left atrial size was moderately dilated.   4. MVA per PHT is 1.7cm2, mean gradient 67mmHg at HR 70bpm. The mitral  valve is degenerative. No evidence of mitral valve regurgitation. Moderate  to severe mitral stenosis.   5. The aortic valve was not well visualized. Aortic valve regurgitation  is mild. Mild aortic valve sclerosis is present, with no evidence of  aortic valve stenosis.   6. The inferior vena cava is dilated in size with >50% respiratory  variability, suggesting right atrial pressure of 8 mmHg.   Comparison(s): EF 55-60%, mild-moderate pericardial effusion, moderate MR  leaflet thickening, moderate MV stenosis  peak gradient 31.5 mmHg, mean  gradient 10.0 mmHG, moderate MR. No evidence of aortic insufficiency.  __________   Elwyn Reach patch 10/2020 (PCP): Predominant rhythm of sinus with an average heart rate of 65 bpm (range 54 to 101 bpm), first-degree AV block was noted, occasional PACs representing a 1% burden, and rare PVCs. __________   2D echo 07/2020:  1. Left ventricular ejection fraction, by estimation, is 55 to 60%. The  left ventricle has normal function. The left ventricle has no regional  wall motion abnormalities. Left ventricular diastolic parameters are  indeterminate.   2. Right ventricular systolic function is normal. The right ventricular  size is normal. Tricuspid regurgitation signal is inadequate for assessing  PA pressure.   3. Left atrial size was mildly dilated.   4. Mild to Moderate circumferential pericardial effusion.   5. The mitral valve leaflets are thickened. Mild to moderate mitral valve  regurgitation. Moderate mitral stenosis. The mean mitral valve gradient is  10.0 mmHg.   6. The inferior vena cava is dilated in size with <50% respiratory  variability, suggesting right atrial pressure of 15 mmHg.  __________   Outpatient cardiac monitoring 05/2020: Normal sinus rhythm avg HR of 64 bpm.    Second Degree AV Block-Mobitz I (Wenckebach) was present (frequent 2-second pauses)   Isolated SVEs were rare (<1.0%), and no SVE Couplets or SVE Triplets were present. Isolated VEs were rare (<1.0%), and no VE Couplets or VE Triplets were present.   No patient triggered events recorded. __________   2D echo 08/2015: - Procedure narrative: Transthoracic echocardiography. Image    quality was poor. The study was technically difficult, as a    result of poor acoustic windows and poor sound wave transmission.  - Left ventricle: The cavity size was normal. Systolic function was    normal. The estimated ejection fraction was in the range of 60%    to 65%. Wall motion was  normal; there were no regional wall    motion abnormalities. The study is not technically sufficient to    allow evaluation of LV diastolic function.  - Mitral valve: There was mild regurgitation.  - Left atrium: The atrium was mildly dilated.  - Right ventricle: Systolic function was normal.  - Pulmonary arteries: Systolic pressure was within the normal    range.   Impressions:   - Rhythm is atrial fibrillation.     EKG:  EKG is ordered today.  The EKG ordered today demonstrates NSR, 63 bpm, first-degree AV block, nonspecific ST-T changes along the anterior leads  Recent Labs: 07/03/2020: TSH 3.590 09/17/2020: ALT 44 05/09/2021: BUN 14; Creatinine, Ser 0.85; Hemoglobin 12.7; Platelets 225; Potassium 4.3; Sodium 137  Recent Lipid Panel    Component Value Date/Time   CHOL 119 09/17/2020 1051   TRIG 58 09/17/2020 1051   HDL 66 09/17/2020 1051   CHOLHDL 1.8 09/17/2020 1051   LDLCALC 40 09/17/2020 1051    PHYSICAL EXAM:    VS:  BP 128/68 (BP Location: Left Arm, Patient Position: Sitting, Cuff Size: Normal)   Pulse 63   Ht 5\' 5"  (1.651 m)   Wt 129 lb (58.5 kg)   SpO2 96%   BMI 21.47 kg/m   BMI: Body mass index is 21.47 kg/m.  Physical Exam Vitals reviewed.  Constitutional:      Appearance: She is well-developed.  HENT:     Head: Normocephalic and atraumatic.  Eyes:     General:        Right eye: No discharge.        Left eye: No discharge.  Neck:     Vascular: No JVD.  Cardiovascular:     Rate and Rhythm: Normal rate and regular rhythm.     Pulses:          Posterior tibial pulses are 2+ on the right side and 2+ on  the left side.     Heart sounds: Normal heart sounds, S1 normal and S2 normal. Heart sounds not distant. No midsystolic click and no opening snap. No murmur heard.   No friction rub.     Comments: Right radial arteriotomy site is well-healing without active bleeding, bruising, swelling, warmth, erythema, or tenderness to palpation.  Radial pulse is 2+  proximal and distal to the arteriotomy site. Pulmonary:     Effort: Pulmonary effort is normal. No respiratory distress.     Breath sounds: Normal breath sounds. No decreased breath sounds, wheezing or rales.  Chest:     Chest wall: No tenderness.  Abdominal:     General: There is no distension.     Palpations: Abdomen is soft.     Tenderness: There is no abdominal tenderness.  Musculoskeletal:     Cervical back: Normal range of motion.     Right lower leg: No edema.     Left lower leg: No edema.  Skin:    General: Skin is warm and dry.     Nails: There is no clubbing.  Neurological:     Mental Status: She is alert and oriented to person, place, and time.  Psychiatric:        Speech: Speech normal.        Behavior: Behavior normal.        Thought Content: Thought content normal.        Judgment: Judgment normal.    Wt Readings from Last 3 Encounters:  05/30/21 129 lb (58.5 kg)  05/17/21 129 lb (58.5 kg)  05/09/21 129 lb 6 oz (58.7 kg)     ASSESSMENT & PLAN:   CAD involving the native coronary arteries with stable dyspnea: Symptoms are overall improving.  Recent LHC showed severe single-vessel CAD as outlined above.  She will be evaluated by cardiothoracic surgery following completion of TEE for further recommendations regarding her CAD and mitral valve stenosis.  [place] in place of aspirin to minimize bleeding risk.  Moderate to severe mitral valve stenosis with at least moderate mitral regurgitation: Schedule TEE.  She will be evaluated by CVTS as outlined above following TEE.  After careful review of history and examination, the risks and benefits of transesophageal echocardiogram have been explained including risks of esophageal damage, perforation (1:10,000 risk), bleeding, pharyngeal hematoma as well as other potential complications associated with conscious sedation including aspiration, arrhythmia, respiratory failure and death. Alternatives to treatment were discussed,  questions were answered. Patient is willing to proceed.   PAF: Maintaining sinus rhythm with amiodarone and Cardizem CD.  Given a CHA2DS2-VASc at least 4, indefinite [place] is recommended.  She does have progression of her mitral valve disease that is now moderate to severe, therefore DOAC should be transitioned to warfarin.  We did discuss potential off label use of DOAC with continuation of apixaban in the context of mitral stenosis versus transitioning to Coumadin.  After discussing with the patient, she defers the decision to [place].  Case has been discussed with office DOD (Dr. Fletcher Anon), who recommends continuing apixaban 2.5 mg twice daily with initiation of warfarin 5 mg daily to start 5 days before she is evaluated by the Coumadin clinic with subsequent discontinuation of Eliquis at that time.  This transition from [place] to warfarin was preferred over Lovenox bridging by MD.  Pulmonary hypertension: Dyspnea is improving as outlined above with Lasix, which will be continued.  Check CMP.  Carotid artery disease: Patent RICA stent with 40 to 59%  bilateral ICA stenosis on most recent imaging.  Followed by vascular surgery.  [place] in place of aspirin to minimize bleeding risk.  She remains on rosuvastatin and ezetimibe.  HTN: Blood pressure is well controlled in the office today.  She remains on Cardizem CD and Lasix.  HLD: LDL 40 in 08/2020.  She remains on ezetimibe and rosuvastatin.  Chronic dizziness: Extensive cardiac, ENT, and neurologic work-up has been largely unrevealing.  Dizziness does seem to be improving at this time.  Disposition: F/u with Dr. Rockey Situ or an APP in 4 to 6 weeks.   Medication Adjustments/Labs and Tests Ordered: Current medicines are reviewed at length with the patient today.  Concerns regarding medicines are outlined above. Medication changes, Labs and Tests ordered today are summarized above and listed in the Patient Instructions accessible in Encounters.   Signed, Christell Faith,  PA-C 05/30/2021 4:21 PM     [address] [place], [place] [address] [phone]

## 2021-05-29 NOTE — H&P (View-Only) (Signed)
Cardiology Office Note    Date:  05/30/2021   ID:  Molly Hodges, DOB [birth year], MRN [id number]  PCP:  Birdie Sons, MD  Cardiologist:  None  Electrophysiologist:  None   Chief Complaint: Follow-up  History of Present Illness:   Molly Hodges is a 87 y.o. female with history of recently diagnosed CAD by LHC as outlined below, PAF status post DCCV in 10/2015, moderate to severe mitral stenosis, pericardial effusion, pulmonary hypertension, carotid artery stenosis status post right-sided stenting in 07/2020, asthma, HTN, and chronic dizziness who presents for follow-up of recent diagnostic R/LHC.   With regards to her A. fib, she was diagnosed with this in 08/2015 in the context of pneumonia.  She was started on Eliquis and rate controlled.  Echo at that time showed an EF of 60 to 65%, normal wall motion, mild mitral regurgitation, mildly dilated left atrium measuring 40 mm, normal RV systolic function, and normal PASP.  She subsequently underwent a DCCV in 10/2015.  Outpatient cardiac monitoring in 05/2020 showed a predominant rhythm of sinus with an average heart rate of 64 bpm, second-degree AV block type I was present with frequent 2-second pauses predominantly occurring at nighttime.  Echo in 07/2020 showed an EF of 55 to 60%, no regional wall motion abnormalities, indeterminate LV diastolic function parameters, normal RV systolic function and ventricular cavity size, mildly dilated left atrium, mild to moderate circumferential pericardial effusion, mild to moderate mitral regurgitation, moderate mitral stenosis with a mean gradient of 10 mmHg, and an estimated right atrial pressure of 15 mmHg.  Outpatient cardiac monitoring performed through the PCPs office in 10/2020, for chronic dizziness, showed a predominant rhythm of sinus, average heart rate 65 bpm with a range of 54 to 101 bpm, first-degree AV block, and rare PACs and PVCs.  No significant arrhythmias, prolonged pauses, or  high-grade AV block were noted.  Most recent carotid artery ultrasound from 11/2020 showed bilateral ICA 40 to 59% stenosis with velocities elevated along the right side felt to possibly be in the setting of prior stenting.  Patent RICA stent was noted.  There was bilateral antegrade flow of the vertebral arteries and normal flow hemodynamics of the bilateral subclavian arteries.  She was evaluated by pulmonology in 11/2020 for ongoing shortness of breath which was not felt to be related to pulmonary disease.  They had suspicion for possible pulmonary hypertension or worsening mitral valve disease.  They made note that the patient did not have COPD.  Echo, as ordered by pulmonology on 12/13/2020, showed an EF of 55 to 60%, no regional wall motion abnormalities, grade 2 diastolic dysfunction, low normal RV systolic function with normal RV cavity size and mildly increased RV wall thickness, moderately dilated left atrium, degenerative mitral valve with no evidence of regurgitation and moderate to severe mitral stenosis with a MVA per PHT of 1.7 cm and a mean gradient of 11 mmHg with a heart rate of 70 bpm, no evidence of pericardial effusion, mild aortic valve insufficiency and mild aortic valve sclerosis without evidence of stenosis, and an estimated right atrial pressure of 8 mmHg.  She was last seen in the office on 05/09/2021 and continued to note chronic stable dizziness and dyspnea.  Given persistent dyspnea, she underwent R/LHC on 05/17/2021 which demonstrated severe single-vessel CAD with 80 to 90% proximal/mid LAD stenosis involving a large D1 branch.  The D1 also contained an 80% stenosis in its proximal segment.  No significant disease was seen in  ramus intermedius, LCx, and RCA.  Mildly elevated right heart and left ventricular pressures.  Moderately to severely elevated pulmonary artery pressure with a mean PA pressure 42 mmHg with elevated pulmonary vascular resistance.  There was severe mitral valve  stenosis which may be extenuated by at least moderate mitral regurgitation based on PCWP tracing as outlined below.  The case was discussed with her primary cardiologist with recommendation to refer the patient to cardiothoracic surgery for further evaluation of potential mitral valve intervention and CABG.  There was also recommended to consider a TEE for further evaluation of her mitral valve disease.  She was started on furosemide 40 mg daily.  She comes in today accompanied by her daughter and is doing well from a cardiac perspective.  Since initiating Lasix, she notes an improvement in her dyspnea.  She also notes an improvement in her dizziness.  Her weight remains stable.  She has noted an increase in urine volume on loop diuretic.  No angina, lower extremity swelling, orthopnea, PND, or early satiety.  She does continue to note chronic stable fatigue.  Blood pressure is well controlled.  She is eager to get further input regarding management of her CAD and mitral valve moving forward.  Otherwise, she does not have any issues or concerns at this time.  Labs independently reviewed: 04/2021 - Hgb 12.7, PLT 225, BUN 14, serum creatinine 0.85, potassium 4.3 08/2020 - TC 119, TG 58, HDL 66, LDL 40, albumin 4.3, AST normal, ALT 44, A1c 6.6 06/2020 - TSH normal  Past Medical History:  Diagnosis Date   Asthma    Carotid artery disease ([place])    Community acquired pneumonia    Essential hypertension    Glaucoma    Hyperlipidemia    Mitral regurgitation    a. TTE 08/2015: EF 60-65%, normal wall motion, mild MR, mildly dilated left atrium measuring 40 mm, RVSF normal, PASP normal   PAF (paroxysmal atrial fibrillation) ([place]) 09/17/2015   a. s/p DCCV 11/15/2015; CHADS2VASc => 4 (HTN, age x 2, female); c. on Eliquis    Past Surgical History:  Procedure Laterality Date   ABDOMINAL HYSTERECTOMY  1987   due to heavy bleeding   APPENDECTOMY     CAROTID PTA/STENT INTERVENTION Right 08/15/2020    Procedure: CAROTID PTA/STENT INTERVENTION;  Surgeon: Algernon Huxley, MD;  Location: [facility] [facility];  Service: Cardiovascular;  Laterality: Right;   CATARACT EXTRACTION     ELECTROPHYSIOLOGIC STUDY N/A 11/15/2015   Procedure: CARDIOVERSION;  Surgeon: Minna Merritts, MD;  Location: [facility];  Service: Cardiovascular;  Laterality: N/A;   RIGHT/LEFT HEART CATH AND CORONARY ANGIOGRAPHY N/A 05/17/2021   Procedure: RIGHT/LEFT HEART CATH AND CORONARY ANGIOGRAPHY;  Surgeon: Nelva Bush, MD;  Location: [facility];  Service: Cardiovascular;  Laterality: N/A;    Current Medications: Current Meds  Medication Sig   albuterol (PROVENTIL) (2.5 MG/3ML) 0.083% nebulizer solution TAKE 3 MLS (2.5 MG TOTAL) BY NEBULIZATION EVERY 4 HOURS AS NEEDED FORUP TO 12 DOSES FOR WHEEZING OR SHORTNESSOF BREATH   albuterol (VENTOLIN HFA) 108 (90 Base) MCG/ACT inhaler Inhale 2 puffs into the lungs every 6 (six) hours as needed for wheezing or shortness of breath.   amiodarone (PACERONE) 200 MG tablet TAKE ONE TABLET EVERY DAY   apixaban (ELIQUIS) 2.5 MG TABS tablet Take 1 tablet (2.5 mg total) by mouth 2 (two) times daily.   budesonide-formoterol (SYMBICORT) 160-4.5 MCG/ACT inhaler Inhale 2 puffs into the lungs 2 (two) times daily. (Patient taking  differently: Inhale 2 puffs into the lungs 2 (two) times daily as needed (asthma).)   Calcium Carbonate-Vitamin D 600-400 MG-UNIT tablet Take 1 tablet by mouth daily.   cholecalciferol (VITAMIN D) 1000 UNITS tablet Take 1,000 Units by mouth daily.   diltiazem (CARDIZEM CD) 180 MG 24 hr capsule TAKE 1 CAPSULE EVERY DAY   dorzolamide (TRUSOPT) 2 % ophthalmic solution Place 1 drop into both eyes 2 (two) times daily.    ezetimibe (ZETIA) 10 MG tablet Take 1 tablet (10 mg total) by mouth daily.   furosemide (LASIX) 40 MG tablet Take 1 tablet (40 mg total) by mouth daily.   ipratropium-albuterol (DUONEB) 0.5-2.5 (3) MG/3ML SOLN Take 3 mLs by nebulization every 4 (four)  hours as needed.   latanoprost (XALATAN) 0.005 % ophthalmic solution Place 1 drop into both eyes at bedtime.    magnesium oxide (MAG-OX) 400 MG tablet Take 400 mg by mouth daily.   Multiple Vitamins-Minerals (PRESERVISION AREDS 2+MULTI VIT) CAPS Take 1 capsule by mouth in the morning and at bedtime.   nitroGLYCERIN (NITROSTAT) 0.4 MG SL tablet Place 1 tablet (0.4 mg total) under the tongue every 5 (five) minutes as needed (for chest pain or shortness of breath).   Omega-3 Fatty Acids (FISH OIL BURP-LESS) 1000 MG CAPS Take 1,000 mg by mouth daily.    potassium chloride (KLOR-CON) 10 MEQ tablet TAKE 1 TABLET BY MOUTH DAILY   pregabalin (LYRICA) 50 MG capsule TAKE 1 CAPSULE BY MOUTH 2 TIMES DAILY   rosuvastatin (CRESTOR) 20 MG tablet Take 1 tablet (20 mg total) by mouth daily.   vitamin C (ASCORBIC ACID) 500 MG tablet Take 500 mg by mouth daily.   vitamin E 100 UNIT capsule Take 200 Units by mouth daily.   warfarin (COUMADIN) 5 MG tablet Take 1 tablet (5 mg total) by mouth daily. Start 10/22, take with Eliquis. See Coumadin nurse 10/26   Current Facility-Administered Medications for the 05/30/21 encounter (Office Visit) with Rise Mu, PA-C  Medication   sodium chloride flush (NS) 0.9 % injection 3 mL    Allergies:   Shellfish allergy, Azithromycin, Tamiflu [oseltamivir phosphate], and Albuterol   Social History   Socioeconomic History   Marital status: Married    Spouse name: Not on file   Number of children: 4   Years of education: Not on file   Highest education level: 12th grade  Occupational History   Occupation: retired  Tobacco Use   Smoking status: Never   Smokeless tobacco: Never   Tobacco comments:    never   Vaping Use   Vaping Use: Never used  Substance and Sexual Activity   Alcohol use: No   Drug use: Never   Sexual activity: Not on file  Other Topics Concern   Not on file  Social History Narrative   Lives at home with Husband. Active and Independent at  baseline.   Social Determinants of Health   Financial Resource Strain: Low Risk    Difficulty of Paying Living Expenses: Not hard at all  Food Insecurity: No Food Insecurity   Worried About Charity fundraiser in the Last Year: Never true   [place] in the Last Year: Never true  Transportation Needs: No Transportation Needs   Lack of Transportation (Medical): No   Lack of Transportation (Non-Medical): No  Physical Activity: Inactive   Days of Exercise per Week: 0 days   Minutes of Exercise per Session: 0 min  Stress: No Stress Concern  Present   Feeling of Stress : Not at all  Social Connections: Moderately Isolated   Frequency of Communication with Friends and Family: More than three times a week   Frequency of Social Gatherings with Friends and Family: More than three times a week   Attends Religious Services: Never   Marine scientist or Organizations: No   Attends Music therapist: Never   Marital Status: Married     Family History:  The patient's family history includes CAD in her father and mother; Cancer (age of onset: 61) in her son.  ROS:   ROS   EKGs/Labs/Other Studies Reviewed:    Studies reviewed were summarized above. The additional studies were reviewed today:  [facility] 05/17/2021: Conclusions: Severe single-vessel coronary artery disease with 80-90% proximal/mid LAD stenosis involving large D1 branch.  D1 also contains an 80% stenosis in its proximal segment.  No significant disease seen in ramus intermedius, LCx, and RCA. Mildly elevated right heart and left ventricular pressures (mean RA 11 mmHg, LVEDP 20 mmHg). Moderately-severely elevated pulmonary artery pressure (mean PA 42 mmHg) with elevated pulmonary vascular resistance (PVR 4.7 WU). Severe mitral valve stenosis, which may be accentuated by at least moderate mitral regurgitation based on PCWP tracing) mean gradient 13-17 mmHg, MVA 0.5-0.6 cm).   Recommendations: Consider TEE  for further evaluation of mitral valve disease. Case discussed with Dr. Rockey Situ.  We have agreed to refer the patient to cardiac surgery for further evaluation for potential mitral valve intervention and CABG. Add furosemide 40 mg p.o. daily.  Basic metabolic panel should be checked when the patient follows up in the office in 2 weeks. Aggressive secondary prevention of coronary artery disease. Restart apixaban 2.5 mg twice daily tomorrow morning if no evidence of bleeding/vascular injury.  Given greater than moderate mitral stenosis, transition to warfarin should be considered in the future in the setting of valvular atrial fibrillation. __________  2D echo 12/13/2020: 1. Left ventricular ejection fraction, by estimation, is 55 to 60%. Left  ventricular ejection fraction by 3D volume is 62 %. The left ventricle has  normal function. The left ventricle has no regional wall motion  abnormalities. Left ventricular diastolic   parameters are consistent with Grade II diastolic dysfunction  (pseudonormalization). The average left ventricular global longitudinal  strain is -14.4 %. The global longitudinal strain is abnormal.   2. Right ventricular systolic function is low normal. The right  ventricular size is normal. Mildly increased right ventricular wall  thickness.   3. Left atrial size was moderately dilated.   4. MVA per PHT is 1.7cm2, mean gradient 44mmHg at HR 70bpm. The mitral  valve is degenerative. No evidence of mitral valve regurgitation. Moderate  to severe mitral stenosis.   5. The aortic valve was not well visualized. Aortic valve regurgitation  is mild. Mild aortic valve sclerosis is present, with no evidence of  aortic valve stenosis.   6. The inferior vena cava is dilated in size with >50% respiratory  variability, suggesting right atrial pressure of 8 mmHg.   Comparison(s): EF 55-60%, mild-moderate pericardial effusion, moderate MR  leaflet thickening, moderate MV stenosis  peak gradient 31.5 mmHg, mean  gradient 10.0 mmHG, moderate MR. No evidence of aortic insufficiency.  __________   Elwyn Reach patch 10/2020 (PCP): Predominant rhythm of sinus with an average heart rate of 65 bpm (range 54 to 101 bpm), first-degree AV block was noted, occasional PACs representing a 1% burden, and rare PVCs. __________   2D echo 07/2020:  1. Left ventricular ejection fraction, by estimation, is 55 to 60%. The  left ventricle has normal function. The left ventricle has no regional  wall motion abnormalities. Left ventricular diastolic parameters are  indeterminate.   2. Right ventricular systolic function is normal. The right ventricular  size is normal. Tricuspid regurgitation signal is inadequate for assessing  PA pressure.   3. Left atrial size was mildly dilated.   4. Mild to Moderate circumferential pericardial effusion.   5. The mitral valve leaflets are thickened. Mild to moderate mitral valve  regurgitation. Moderate mitral stenosis. The mean mitral valve gradient is  10.0 mmHg.   6. The inferior vena cava is dilated in size with <50% respiratory  variability, suggesting right atrial pressure of 15 mmHg.  __________   Outpatient cardiac monitoring 05/2020: Normal sinus rhythm avg HR of 64 bpm.    Second Degree AV Block-Mobitz I (Wenckebach) was present (frequent 2-second pauses)   Isolated SVEs were rare (<1.0%), and no SVE Couplets or SVE Triplets were present. Isolated VEs were rare (<1.0%), and no VE Couplets or VE Triplets were present.   No patient triggered events recorded. __________   2D echo 08/2015: - Procedure narrative: Transthoracic echocardiography. Image    quality was poor. The study was technically difficult, as a    result of poor acoustic windows and poor sound wave transmission.  - Left ventricle: The cavity size was normal. Systolic function was    normal. The estimated ejection fraction was in the range of 60%    to 65%. Wall motion was  normal; there were no regional wall    motion abnormalities. The study is not technically sufficient to    allow evaluation of LV diastolic function.  - Mitral valve: There was mild regurgitation.  - Left atrium: The atrium was mildly dilated.  - Right ventricle: Systolic function was normal.  - Pulmonary arteries: Systolic pressure was within the normal    range.   Impressions:   - Rhythm is atrial fibrillation.     EKG:  EKG is ordered today.  The EKG ordered today demonstrates NSR, 63 bpm, first-degree AV block, nonspecific ST-T changes along the anterior leads  Recent Labs: 07/03/2020: TSH 3.590 09/17/2020: ALT 44 05/09/2021: BUN 14; Creatinine, Ser 0.85; Hemoglobin 12.7; Platelets 225; Potassium 4.3; Sodium 137  Recent Lipid Panel    Component Value Date/Time   CHOL 119 09/17/2020 1051   TRIG 58 09/17/2020 1051   HDL 66 09/17/2020 1051   CHOLHDL 1.8 09/17/2020 1051   LDLCALC 40 09/17/2020 1051    PHYSICAL EXAM:    VS:  BP 128/68 (BP Location: Left Arm, Patient Position: Sitting, Cuff Size: Normal)   Pulse 63   Ht 5\' 5"  (1.651 m)   Wt 129 lb (58.5 kg)   SpO2 96%   BMI 21.47 kg/m   BMI: Body mass index is 21.47 kg/m.  Physical Exam Vitals reviewed.  Constitutional:      Appearance: She is well-developed.  HENT:     Head: Normocephalic and atraumatic.  Eyes:     General:        Right eye: No discharge.        Left eye: No discharge.  Neck:     Vascular: No JVD.  Cardiovascular:     Rate and Rhythm: Normal rate and regular rhythm.     Pulses:          Posterior tibial pulses are 2+ on the right side and 2+ on  the left side.     Heart sounds: Normal heart sounds, S1 normal and S2 normal. Heart sounds not distant. No midsystolic click and no opening snap. No murmur heard.   No friction rub.     Comments: Right radial arteriotomy site is well-healing without active bleeding, bruising, swelling, warmth, erythema, or tenderness to palpation.  Radial pulse is 2+  proximal and distal to the arteriotomy site. Pulmonary:     Effort: Pulmonary effort is normal. No respiratory distress.     Breath sounds: Normal breath sounds. No decreased breath sounds, wheezing or rales.  Chest:     Chest wall: No tenderness.  Abdominal:     General: There is no distension.     Palpations: Abdomen is soft.     Tenderness: There is no abdominal tenderness.  Musculoskeletal:     Cervical back: Normal range of motion.     Right lower leg: No edema.     Left lower leg: No edema.  Skin:    General: Skin is warm and dry.     Nails: There is no clubbing.  Neurological:     Mental Status: She is alert and oriented to person, place, and time.  Psychiatric:        Speech: Speech normal.        Behavior: Behavior normal.        Thought Content: Thought content normal.        Judgment: Judgment normal.    Wt Readings from Last 3 Encounters:  05/30/21 129 lb (58.5 kg)  05/17/21 129 lb (58.5 kg)  05/09/21 129 lb 6 oz (58.7 kg)     ASSESSMENT & PLAN:   CAD involving the native coronary arteries with stable dyspnea: Symptoms are overall improving.  Recent LHC showed severe single-vessel CAD as outlined above.  She will be evaluated by cardiothoracic surgery following completion of TEE for further recommendations regarding her CAD and mitral valve stenosis.  [place] in place of aspirin to minimize bleeding risk.  Moderate to severe mitral valve stenosis with at least moderate mitral regurgitation: Schedule TEE.  She will be evaluated by CVTS as outlined above following TEE.  After careful review of history and examination, the risks and benefits of transesophageal echocardiogram have been explained including risks of esophageal damage, perforation (1:10,000 risk), bleeding, pharyngeal hematoma as well as other potential complications associated with conscious sedation including aspiration, arrhythmia, respiratory failure and death. Alternatives to treatment were discussed,  questions were answered. Patient is willing to proceed.   PAF: Maintaining sinus rhythm with amiodarone and Cardizem CD.  Given a CHA2DS2-VASc at least 4, indefinite [place] is recommended.  She does have progression of her mitral valve disease that is now moderate to severe, therefore DOAC should be transitioned to warfarin.  We did discuss potential off label use of DOAC with continuation of apixaban in the context of mitral stenosis versus transitioning to Coumadin.  After discussing with the patient, she defers the decision to [place].  Case has been discussed with office DOD (Dr. Fletcher Anon), who recommends continuing apixaban 2.5 mg twice daily with initiation of warfarin 5 mg daily to start 5 days before she is evaluated by the Coumadin clinic with subsequent discontinuation of Eliquis at that time.  This transition from [place] to warfarin was preferred over Lovenox bridging by MD.  Pulmonary hypertension: Dyspnea is improving as outlined above with Lasix, which will be continued.  Check CMP.  Carotid artery disease: Patent RICA stent with 40 to 59%  bilateral ICA stenosis on most recent imaging.  Followed by vascular surgery.  [place] in place of aspirin to minimize bleeding risk.  She remains on rosuvastatin and ezetimibe.  HTN: Blood pressure is well controlled in the office today.  She remains on Cardizem CD and Lasix.  HLD: LDL 40 in 08/2020.  She remains on ezetimibe and rosuvastatin.  Chronic dizziness: Extensive cardiac, ENT, and neurologic work-up has been largely unrevealing.  Dizziness does seem to be improving at this time.  Disposition: F/u with Dr. Rockey Situ or an APP in 4 to 6 weeks.   Medication Adjustments/Labs and Tests Ordered: Current medicines are reviewed at length with the patient today.  Concerns regarding medicines are outlined above. Medication changes, Labs and Tests ordered today are summarized above and listed in the Patient Instructions accessible in Encounters.   Signed, Christell Faith,  PA-C 05/30/2021 4:21 PM     [address], [place] [id number] [phone]

## 2021-05-30 ENCOUNTER — Telehealth: Payer: Self-pay | Admitting: Physician Assistant

## 2021-05-30 ENCOUNTER — Encounter: Payer: Self-pay | Admitting: Physician Assistant

## 2021-05-30 ENCOUNTER — Ambulatory Visit: Payer: Medicare Other | Admitting: Physician Assistant

## 2021-05-30 ENCOUNTER — Other Ambulatory Visit: Payer: Self-pay

## 2021-05-30 VITALS — BP 128/68 | HR 63 | Ht 65.0 in | Wt 129.0 lb

## 2021-05-30 DIAGNOSIS — I050 Rheumatic mitral stenosis: Secondary | ICD-10-CM | POA: Diagnosis not present

## 2021-05-30 DIAGNOSIS — I6523 Occlusion and stenosis of bilateral carotid arteries: Secondary | ICD-10-CM | POA: Diagnosis not present

## 2021-05-30 DIAGNOSIS — I272 Pulmonary hypertension, unspecified: Secondary | ICD-10-CM | POA: Diagnosis not present

## 2021-05-30 DIAGNOSIS — I10 Essential (primary) hypertension: Secondary | ICD-10-CM | POA: Diagnosis not present

## 2021-05-30 DIAGNOSIS — E785 Hyperlipidemia, unspecified: Secondary | ICD-10-CM

## 2021-05-30 DIAGNOSIS — R42 Dizziness and giddiness: Secondary | ICD-10-CM

## 2021-05-30 DIAGNOSIS — R0609 Other forms of dyspnea: Secondary | ICD-10-CM

## 2021-05-30 DIAGNOSIS — I25118 Atherosclerotic heart disease of native coronary artery with other forms of angina pectoris: Secondary | ICD-10-CM | POA: Diagnosis not present

## 2021-05-30 DIAGNOSIS — I340 Nonrheumatic mitral (valve) insufficiency: Secondary | ICD-10-CM

## 2021-05-30 DIAGNOSIS — I480 Paroxysmal atrial fibrillation: Secondary | ICD-10-CM

## 2021-05-30 DIAGNOSIS — Z79899 Other long term (current) drug therapy: Secondary | ICD-10-CM

## 2021-05-30 MED ORDER — WARFARIN SODIUM 5 MG PO TABS: 5.0000 mg | ORAL_TABLET | Freq: Every day | ORAL | 0 refills | Status: DC

## 2021-05-30 NOTE — Telephone Encounter (Signed)
Please call to discuss interaction between coumadin and amiodarone.

## 2021-05-30 NOTE — Telephone Encounter (Signed)
Attempted to call the pharmacy. All lines busy, but I was able to leave a voice mail and stated that we are aware of the interaction between coumadin and amiodarone.  Advised the patient was seen in office today and instructed not to started coumadin until 06/08/21 and that she has an appointment to follow up in our coumadin clinic on 06/12/21.  I asked that they call back with any further questions/ concerns.  Will forward to [facility], RN- CVRR as an [place].

## 2021-05-30 NOTE — Patient Instructions (Signed)
Medication Instructions:  Start Coumadin 5mg  10/22 Continue Eliquis 2.5 mg Twice a day Coumadin nurse Mandi, will advise when to stop  *If you need a refill on your cardiac medications before your next appointment, please call your pharmacy*   Lab Work: [facility] LABS WILL APPEAR ON MYCHART, ABNORMAL RESULTS WILL BE CALLED  Testing/Procedures: None  Follow-Up: At [facility], you and your health needs are our priority.  As part of our continuing mission to provide you with exceptional heart care, we have created designated Provider Care Teams.  These Care Teams include your primary Cardiologist (physician) and Advanced Practice Providers (APPs -  Physician Assistants and Nurse Practitioners) who all work together to provide you with the care you need, when you need it.  Your next appointment:   4 week(s) Mandi (coumadin nurse) 10/26 The format for your next appointment:   In Person  Provider:   Christell Faith, PA-C   Other Instructions We will call [facility] to see status of when they will call you for your first consult appointment.

## 2021-05-30 NOTE — Telephone Encounter (Signed)
Attempted to reach pt via phone, could not make contact, reach out to her daughter Molly Hodges (DPR approved) to advise on TEE procedure.   Ryan Dunn PA-C had seen pt in clinic today, called pt after OV to advised on need for TEE, pt verbalized understanding and agreed to plan.  Molly Hodges was given date and time, date schedule for after pt's upcoming beach trip. Will send to Mychart and hard copy will be mailed.   Molly Hodges thankful for call, will let Molly Hodges know of date and time, will call back with any concerns or questions.     You are scheduled for a TEE (Transesophageal Echocardiogram) with Dr. Rockey Situ  On 06/13/2021    Your procedure will start at 07:30 am  Please arrive at the [place] [facility] at 07:00 a.m. on the day of your procedure.   DIET INSTRUCTIONS:   Nothing to eat or drink after midnight except your medications with a sip of water   Labs: CMP (completed 10/13) CBC (completed 9/22)  Medications:  YOU MAY TAKE ALL of your morning medications with a small amount of water. Except for: Lasix  Must have a responsible person to drive you home.  Bring a current list of your medications and current insurance cards.    If you have any questions after you get home, please call the office at [phone]   Transesophageal Echocardiogram Transesophageal echocardiogram (TEE) is a test that uses sound waves to take pictures of your heart. TEE is done by passing a small probe attached to a flexible tube down the part of the body that moves food from your mouth to your stomach (esophagus). The pictures give clear images of your heart. This can help your doctor see if there are problems with your heart. Tell a doctor about: Any allergies you have. All medicines you are taking. This includes vitamins, herbs, eye drops, creams, and over-the-counter medicines. Any problems you or family members have had with anesthetic medicines. Any blood disorders you have. Any surgeries you have  had. Any medical conditions you have. Any swallowing problems. Whether you have or have had a blockage in the part of the body that moves food from your mouth to your stomach. Whether you are pregnant or may be pregnant. What are the risks? In general, this is a safe procedure. But, problems may occur, such as: Damage to nearby structures or organs. A tear in the part of the body that moves food from your mouth to your stomach. Irregular heartbeat. Hoarse voice or trouble swallowing. Bleeding. What happens before the procedure? Medicines Ask your doctor about changing or stopping: Your normal medicines. Vitamins, herbs, and supplements. Over-the-counter medicines. Do not take aspirin or ibuprofen unless you are told to. General instructions Follow instructions from your doctor about what you cannot eat or drink. You will take out any dentures or dental retainers. Plan to have a responsible adult take you home from the hospital or clinic. Plan to have a responsible adult care for you for the time you are told after you leave the hospital or clinic. This is important. What happens during the procedure? An IV will be put into one of your veins. You may be given: A sedative. This medicine helps you relax. A medicine to numb the back of your throat. This may be sprayed or gargled. Your blood pressure, heart rate, and breathing will be watched. You may be asked to lie on your left side. A bite block will be  placed in your mouth. This keeps you from biting the tube. The tip of the probe will be placed into the back of your mouth. You will be asked to swallow. Your doctor will take pictures of your heart. The probe and bite block will be taken out after the test is done. The procedure may vary among doctors and hospitals.   What can I expect after the procedure? You will be monitored until you leave the hospital or clinic. This includes checking your blood pressure, heart rate,  breathing rate, and blood oxygen level. Your throat may feel sore and numb. This will get better over time. You will not be allowed to eat or drink until the numbness has gone away. It is common to have a sore throat for a day or two. It is up to you to get the results of your procedure. Ask how to get your results when they are ready. Follow these instructions at home: If you were given a sedative during your procedure, do not drive or use machines until your doctor says that it is safe. Return to your normal activities when your doctor says that it is safe. Keep all follow-up visits. Summary TEE is a test that uses sound waves to take pictures of your heart. You will be given a medicine to help you relax. Do not drive or use machines until your doctor says that it is safe. This information is not intended to replace advice given to you by your health care provider. Make sure you discuss any questions you have with your health care provider. Document Revised: 03/27/2020 Document Reviewed: 03/27/2020 Elsevier Patient Education  2021 [employer].

## 2021-05-31 ENCOUNTER — Telehealth: Payer: Self-pay

## 2021-05-31 LAB — COMPREHENSIVE METABOLIC PANEL
ALT: 43 IU/L — ABNORMAL HIGH (ref 0–32)
AST: 37 IU/L (ref 0–40)
Albumin/Globulin Ratio: 1.8 (ref 1.2–2.2)
Albumin: 4.3 g/dL (ref 3.6–4.6)
Alkaline Phosphatase: 95 IU/L (ref 44–121)
BUN/Creatinine Ratio: 22 (ref 12–28)
BUN: 20 mg/dL (ref 8–27)
Bilirubin Total: 0.3 mg/dL (ref 0.0–1.2)
CO2: 16 mmol/L — ABNORMAL LOW (ref 20–29)
Calcium: 9.1 mg/dL (ref 8.7–10.3)
Chloride: 102 mmol/L (ref 96–106)
Creatinine, Ser: 0.9 mg/dL (ref 0.57–1.00)
Globulin, Total: 2.4 g/dL (ref 1.5–4.5)
Glucose: 82 mg/dL (ref 70–99)
Potassium: 3.7 mmol/L (ref 3.5–5.2)
Sodium: 141 mmol/L (ref 134–144)
Total Protein: 6.7 g/dL (ref 6.0–8.5)
eGFR: 62 mL/min/{1.73_m2} (ref >59)

## 2021-05-31 NOTE — Telephone Encounter (Signed)
Able to reach pt regarding her recent lab work, [employer] had a chance to review their results and advised   "Renal function is normal  Potassium is normal  Protein level is normal  One liver function is mildly elevated, and stable   No changes in plan discussed at office visit "  All questions or concerns were address and no additional concerns at this time. Agreeable to plan, will call back for anything further.

## 2021-06-07 ENCOUNTER — Other Ambulatory Visit: Payer: Self-pay | Admitting: Cardiovascular Disease

## 2021-06-07 ENCOUNTER — Other Ambulatory Visit: Payer: Self-pay | Admitting: Family Medicine

## 2021-06-07 DIAGNOSIS — E785 Hyperlipidemia, unspecified: Secondary | ICD-10-CM

## 2021-06-12 ENCOUNTER — Other Ambulatory Visit: Payer: Self-pay

## 2021-06-12 ENCOUNTER — Ambulatory Visit (INDEPENDENT_AMBULATORY_CARE_PROVIDER_SITE_OTHER): Payer: Medicare Other

## 2021-06-12 DIAGNOSIS — I480 Paroxysmal atrial fibrillation: Secondary | ICD-10-CM | POA: Diagnosis not present

## 2021-06-12 DIAGNOSIS — Z5181 Encounter for therapeutic drug level monitoring: Secondary | ICD-10-CM | POA: Diagnosis not present

## 2021-06-12 DIAGNOSIS — I050 Rheumatic mitral stenosis: Secondary | ICD-10-CM | POA: Diagnosis not present

## 2021-06-12 LAB — POCT INR: INR: 5.8 — AB (ref 2.0–3.0)

## 2021-06-12 MED ORDER — SODIUM CHLORIDE 0.9 % IV SOLN: INTRAVENOUS | Status: DC

## 2021-06-12 NOTE — Patient Instructions (Addendum)
-   STOP ELIQUIS - skip warfarin tonight and tomorrow, then - on Friday, START NEW DOSAGE of 1/2 tablet warfarin every day - Recheck INR next week

## 2021-06-13 ENCOUNTER — Other Ambulatory Visit: Payer: Self-pay

## 2021-06-13 ENCOUNTER — Ambulatory Visit
Admission: RE | Admit: 2021-06-13 | Discharge: 2021-06-13 | Disposition: A | Discharge status: Home / Self Care | Payer: Medicare Other | Source: Ambulatory Visit | Attending: Cardiovascular Disease | Admitting: Cardiovascular Disease

## 2021-06-13 ENCOUNTER — Encounter: Payer: Self-pay | Admitting: Cardiovascular Disease

## 2021-06-13 ENCOUNTER — Ambulatory Visit (HOSPITAL_BASED_OUTPATIENT_CLINIC_OR_DEPARTMENT_OTHER)
Admission: RE | Admit: 2021-06-13 | Discharge: 2021-06-13 | Disposition: A | Discharge status: Home / Self Care | Payer: Medicare Other | Source: Ambulatory Visit | Attending: Physician Assistant | Admitting: Physician Assistant

## 2021-06-13 ENCOUNTER — Encounter
Admission: RE | Disposition: A | Discharge status: Home / Self Care | Payer: Self-pay | Source: Ambulatory Visit | Attending: Cardiovascular Disease

## 2021-06-13 DIAGNOSIS — I340 Nonrheumatic mitral (valve) insufficiency: Secondary | ICD-10-CM | POA: Diagnosis not present

## 2021-06-13 DIAGNOSIS — I052 Rheumatic mitral stenosis with insufficiency: Secondary | ICD-10-CM | POA: Insufficient documentation

## 2021-06-13 DIAGNOSIS — I480 Paroxysmal atrial fibrillation: Secondary | ICD-10-CM | POA: Diagnosis not present

## 2021-06-13 DIAGNOSIS — Z881 Allergy status to other antibiotic agents status: Secondary | ICD-10-CM | POA: Insufficient documentation

## 2021-06-13 DIAGNOSIS — Z91013 Allergy to seafood: Secondary | ICD-10-CM | POA: Diagnosis not present

## 2021-06-13 DIAGNOSIS — I342 Nonrheumatic mitral (valve) stenosis: Secondary | ICD-10-CM

## 2021-06-13 DIAGNOSIS — Z79899 Other long term (current) drug therapy: Secondary | ICD-10-CM | POA: Diagnosis not present

## 2021-06-13 DIAGNOSIS — I3139 Other pericardial effusion (noninflammatory): Secondary | ICD-10-CM | POA: Insufficient documentation

## 2021-06-13 DIAGNOSIS — I272 Pulmonary hypertension, unspecified: Secondary | ICD-10-CM | POA: Diagnosis not present

## 2021-06-13 DIAGNOSIS — Z8249 Family history of ischemic heart disease and other diseases of the circulatory system: Secondary | ICD-10-CM | POA: Diagnosis not present

## 2021-06-13 DIAGNOSIS — I441 Atrioventricular block, second degree: Secondary | ICD-10-CM | POA: Insufficient documentation

## 2021-06-13 DIAGNOSIS — J45909 Unspecified asthma, uncomplicated: Secondary | ICD-10-CM | POA: Diagnosis not present

## 2021-06-13 DIAGNOSIS — I10 Essential (primary) hypertension: Secondary | ICD-10-CM | POA: Insufficient documentation

## 2021-06-13 DIAGNOSIS — Z7901 Long term (current) use of anticoagulants: Secondary | ICD-10-CM | POA: Insufficient documentation

## 2021-06-13 DIAGNOSIS — I2510 Atherosclerotic heart disease of native coronary artery without angina pectoris: Secondary | ICD-10-CM | POA: Insufficient documentation

## 2021-06-13 DIAGNOSIS — E785 Hyperlipidemia, unspecified: Secondary | ICD-10-CM | POA: Diagnosis not present

## 2021-06-13 DIAGNOSIS — I6523 Occlusion and stenosis of bilateral carotid arteries: Secondary | ICD-10-CM | POA: Insufficient documentation

## 2021-06-13 DIAGNOSIS — Z7951 Long term (current) use of inhaled steroids: Secondary | ICD-10-CM | POA: Insufficient documentation

## 2021-06-13 DIAGNOSIS — I050 Rheumatic mitral stenosis: Secondary | ICD-10-CM

## 2021-06-13 HISTORY — PX: TEE WITHOUT CARDIOVERSION: SHX5443

## 2021-06-13 SURGERY — ECHOCARDIOGRAM, TRANSESOPHAGEAL
Anesthesia: Moderate Sedation

## 2021-06-13 MED ORDER — BUTAMBEN-TETRACAINE-BENZOCAINE 2-2-14 % EX AERO
INHALATION_SPRAY | CUTANEOUS | Status: AC
  Filled 2021-06-13: qty 5

## 2021-06-13 MED ORDER — FENTANYL CITRATE (PF) 100 MCG/2ML IJ SOLN
INTRAMUSCULAR | Status: AC | PRN
  Administered 2021-06-13: 50 ug via INTRAVENOUS
  Administered 2021-06-13: 25 ug via INTRAVENOUS

## 2021-06-13 MED ORDER — BUTAMBEN-TETRACAINE-BENZOCAINE 2-2-14 % EX AERO
INHALATION_SPRAY | CUTANEOUS | Status: AC | PRN
  Administered 2021-06-13: 1 via TOPICAL

## 2021-06-13 MED ORDER — MIDAZOLAM HCL 2 MG/2ML IJ SOLN
INTRAMUSCULAR | Status: AC
  Filled 2021-06-13: qty 2

## 2021-06-13 MED ORDER — SODIUM CHLORIDE FLUSH 0.9 % IV SOLN
INTRAVENOUS | Status: AC
  Filled 2021-06-13: qty 10

## 2021-06-13 MED ORDER — LIDOCAINE VISCOUS HCL 2 % MT SOLN
OROMUCOSAL | Status: AC | PRN
  Administered 2021-06-13: 15 mL via OROMUCOSAL

## 2021-06-13 MED ORDER — LIDOCAINE VISCOUS HCL 2 % MT SOLN
OROMUCOSAL | Status: AC
  Filled 2021-06-13: qty 15

## 2021-06-13 MED ORDER — FENTANYL CITRATE (PF) 100 MCG/2ML IJ SOLN
INTRAMUSCULAR | Status: AC
  Filled 2021-06-13: qty 2

## 2021-06-13 MED ORDER — MIDAZOLAM HCL 2 MG/2ML IJ SOLN
INTRAMUSCULAR | Status: AC | PRN
  Administered 2021-06-13: 1 mg via INTRAVENOUS
  Administered 2021-06-13: 2 mg via INTRAVENOUS

## 2021-06-13 NOTE — Progress Notes (Signed)
*  PRELIMINARY RESULTS* Echocardiogram Echocardiogram Transesophageal has been performed.  Molly Hodges 06/13/2021, 8:33 AM

## 2021-06-14 LAB — ECHO TEE: Area-P 1/2: 1.73 cm2

## 2021-06-17 ENCOUNTER — Other Ambulatory Visit (INDEPENDENT_AMBULATORY_CARE_PROVIDER_SITE_OTHER): Payer: Self-pay | Admitting: Vascular Surgery

## 2021-06-17 ENCOUNTER — Encounter: Payer: Self-pay | Admitting: Family Medicine

## 2021-06-17 DIAGNOSIS — I6523 Occlusion and stenosis of bilateral carotid arteries: Secondary | ICD-10-CM

## 2021-06-17 DIAGNOSIS — I340 Nonrheumatic mitral (valve) insufficiency: Secondary | ICD-10-CM | POA: Insufficient documentation

## 2021-06-18 ENCOUNTER — Encounter (INDEPENDENT_AMBULATORY_CARE_PROVIDER_SITE_OTHER): Payer: Self-pay | Admitting: Vascular Surgery

## 2021-06-18 ENCOUNTER — Other Ambulatory Visit: Payer: Self-pay

## 2021-06-18 ENCOUNTER — Ambulatory Visit (INDEPENDENT_AMBULATORY_CARE_PROVIDER_SITE_OTHER): Payer: Medicare Other

## 2021-06-18 ENCOUNTER — Ambulatory Visit (INDEPENDENT_AMBULATORY_CARE_PROVIDER_SITE_OTHER): Payer: Medicare Other | Admitting: Vascular Surgery

## 2021-06-18 VITALS — BP 128/71 | HR 74 | Ht 65.0 in | Wt 129.0 lb

## 2021-06-18 DIAGNOSIS — I10 Essential (primary) hypertension: Secondary | ICD-10-CM | POA: Diagnosis not present

## 2021-06-18 DIAGNOSIS — I6523 Occlusion and stenosis of bilateral carotid arteries: Secondary | ICD-10-CM

## 2021-06-18 DIAGNOSIS — I6521 Occlusion and stenosis of right carotid artery: Secondary | ICD-10-CM

## 2021-06-18 DIAGNOSIS — E785 Hyperlipidemia, unspecified: Secondary | ICD-10-CM | POA: Diagnosis not present

## 2021-06-18 NOTE — Assessment & Plan Note (Signed)
lipid control important in reducing the progression of atherosclerotic disease. Continue statin therapy  

## 2021-06-18 NOTE — Assessment & Plan Note (Signed)
Carotid duplex today demonstrates velocities just into the 40 to 59% range bilaterally with a stent on the right that appears patent.  No change from study 6 months ago.  No role for intervention.  Continue current medical regimen.  Recheck in 6 months.

## 2021-06-18 NOTE — Progress Notes (Signed)
MRN : [id number]  Molly Hodges is a 87 y.o. (08-Mar-1932) female who presents with chief complaint of  Chief Complaint  Patient presents with   Follow-up    6 Mo catotid  .  History of Present Illness: Patient returns today in follow up of her carotid disease.  She is almost a year status post right carotid stent placement.  She is doing well.  No focal neurologic symptoms or other complaints.  No major issues since her last visit. Carotid duplex today demonstrates velocities just into the 40 to 59% range bilaterally with a stent on the right that appears patent.  No change from study 6 months ago.  Current Outpatient Medications  Medication Sig Dispense Refill   albuterol (PROVENTIL) (2.5 MG/3ML) 0.083% nebulizer solution TAKE 3 MLS (2.5 MG TOTAL) BY NEBULIZATION EVERY 4 HOURS AS NEEDED FORUP TO 12 DOSES FOR WHEEZING OR SHORTNESSOF BREATH 75 mL 5   albuterol (VENTOLIN HFA) 108 (90 Base) MCG/ACT inhaler Inhale 2 puffs into the lungs every 6 (six) hours as needed for wheezing or shortness of breath. 8 g 2   amiodarone (PACERONE) 200 MG tablet TAKE 1 TABLET EVERY DAY. 90 tablet 3   apixaban (ELIQUIS) 2.5 MG TABS tablet Take 1 tablet (2.5 mg total) by mouth 2 (two) times daily. 180 tablet 1   budesonide-formoterol (SYMBICORT) 160-4.5 MCG/ACT inhaler Inhale 2 puffs into the lungs 2 (two) times daily. (Patient taking differently: Inhale 2 puffs into the lungs 2 (two) times daily as needed (asthma).) 1 Inhaler 12   Calcium Carbonate-Vitamin D 600-400 MG-UNIT tablet Take 1 tablet by mouth daily.     cholecalciferol (VITAMIN D) 1000 UNITS tablet Take 1,000 Units by mouth daily.     diltiazem (CARDIZEM CD) 180 MG 24 hr capsule TAKE 1 CAPSULE EVERY DAY 30 capsule 11   dorzolamide (TRUSOPT) 2 % ophthalmic solution Place 1 drop into both eyes 2 (two) times daily.      ezetimibe (ZETIA) 10 MG tablet TAKE 1 TABLET BY MOUTH DAILY. 90 tablet 3   furosemide (LASIX) 40 MG tablet Take 1 tablet (40 mg  total) by mouth daily. 30 tablet 5   ipratropium-albuterol (DUONEB) 0.5-2.5 (3) MG/3ML SOLN Take 3 mLs by nebulization every 4 (four) hours as needed. 360 mL 1   latanoprost (XALATAN) 0.005 % ophthalmic solution Place 1 drop into both eyes at bedtime.      levalbuterol (XOPENEX HFA) 45 MCG/ACT inhaler Inhale 2 puffs into the lungs every 4 (four) hours as needed for wheezing or shortness of breath.     magnesium oxide (MAG-OX) 400 MG tablet Take 400 mg by mouth daily.     Multiple Vitamins-Minerals (PRESERVISION AREDS 2+MULTI VIT) CAPS Take 1 capsule by mouth in the morning and at bedtime.     nitroGLYCERIN (NITROSTAT) 0.4 MG SL tablet Place 1 tablet (0.4 mg total) under the tongue every 5 (five) minutes as needed (for chest pain or shortness of breath). 100 tablet 3   Omega-3 Fatty Acids (FISH OIL BURP-LESS) 1000 MG CAPS Take 1,000 mg by mouth daily.      potassium chloride (KLOR-CON) 10 MEQ tablet TAKE 1 TABLET BY MOUTH DAILY 90 tablet 0   pregabalin (LYRICA) 50 MG capsule TAKE 1 CAPSULE BY MOUTH 2 TIMES DAILY 60 capsule 5   rosuvastatin (CRESTOR) 20 MG tablet TAKE 1 TABLET BY MOUTH DAILY. 90 tablet 3   vitamin C (ASCORBIC ACID) 500 MG tablet Take 500 mg by mouth daily.  vitamin E 100 UNIT capsule Take 200 Units by mouth daily.     warfarin (COUMADIN) 5 MG tablet Take 1 tablet (5 mg total) by mouth daily. Start 10/22, take with Eliquis. See Coumadin nurse 10/26 20 tablet 0   Current Facility-Administered Medications  Medication Dose Route Frequency Provider Last Rate Last Admin   sodium chloride flush (NS) 0.9 % injection 3 mL  3 mL Intravenous Q12H Rise Mu, PA-C        Past Medical History:  Diagnosis Date   Asthma    Carotid artery disease ([place])    Community acquired pneumonia    Essential hypertension    Glaucoma    Hyperlipidemia    Mitral regurgitation    a. TTE 08/2015: EF 60-65%, normal wall motion, mild MR, mildly dilated left atrium measuring 40 mm, RVSF normal, PASP  normal   PAF (paroxysmal atrial fibrillation) ([place]) 09/17/2015   a. s/p DCCV 11/15/2015; CHADS2VASc => 4 (HTN, age x 2, female); c. on Eliquis    Past Surgical History:  Procedure Laterality Date   ABDOMINAL HYSTERECTOMY  1987   due to heavy bleeding   APPENDECTOMY     CAROTID PTA/STENT INTERVENTION Right 08/15/2020   Procedure: CAROTID PTA/STENT INTERVENTION;  Surgeon: Algernon Huxley, MD;  Location: [place] [facility];  Service: Cardiovascular;  Laterality: Right;   CATARACT EXTRACTION     ELECTROPHYSIOLOGIC STUDY N/A 11/15/2015   Procedure: CARDIOVERSION;  Surgeon: Minna Merritts, MD;  Location: [facility];  Service: Cardiovascular;  Laterality: N/A;   RIGHT/LEFT HEART CATH AND CORONARY ANGIOGRAPHY N/A 05/17/2021   Procedure: RIGHT/LEFT HEART CATH AND CORONARY ANGIOGRAPHY;  Surgeon: Nelva Bush, MD;  Location: [place] [facility];  Service: Cardiovascular;  Laterality: N/A;   TEE WITHOUT CARDIOVERSION N/A 06/13/2021   Procedure: TRANSESOPHAGEAL ECHOCARDIOGRAM (TEE);  Surgeon: Minna Merritts, MD;  Location: [facility];  Service: Cardiovascular;  Laterality: N/A;     Social History   Tobacco Use   Smoking status: Never   Smokeless tobacco: Never   Tobacco comments:    never   Vaping Use   Vaping Use: Never used  Substance Use Topics   Alcohol use: No   Drug use: Never      Family History  Problem Relation Age of Onset   CAD Mother    CAD Father    Cancer Son 60       lung cancer     Allergies  Allergen Reactions   Shellfish Allergy Anaphylaxis    Other reaction(s): Hallucination   Azithromycin Other (See Comments)    Extreme burning sensation at IV site Other reaction(s): Unknown   Tamiflu [Oseltamivir Phosphate] Other (See Comments)    Reaction:  Hallucinations     Albuterol Palpitations    Heart racing.      REVIEW OF SYSTEMS (Negative unless checked)   Constitutional: []Weight loss  []Fever  []Chills Cardiac: []Chest pain   []Chest pressure    []Palpitations   []Shortness of breath when laying flat   []Shortness of breath at rest   []Shortness of breath with exertion. Vascular:  []Pain in legs with walking   []Pain in legs at rest   []Pain in legs when laying flat   []Claudication   []Pain in feet when walking  []Pain in feet at rest  []Pain in feet when laying flat   []History of DVT   []Phlebitis   []Swelling in legs   []Varicose veins   []Non-healing ulcers Pulmonary:   []  Uses home oxygen   []Productive cough   []Hemoptysis   []Wheeze  []COPD   []Asthma Neurologic:  [x]Dizziness  []Blackouts   []Seizures   []History of stroke   []History of TIA  []Aphasia   []Temporary blindness   []Dysphagia   []Weakness or numbness in arms   []Weakness or numbness in legs Musculoskeletal:  [x]Arthritis   []Joint swelling   [x]Joint pain   []Low back pain Hematologic:  []Easy bruising  []Easy bleeding   []Hypercoagulable state   []Anemic  []Hepatitis Gastrointestinal:  []Blood in stool   []Vomiting blood  []Gastroesophageal reflux/heartburn   []Abdominal pain Genitourinary:  []Chronic kidney disease   []Difficult urination  []Frequent urination  []Burning with urination   []Hematuria Skin:  []Rashes   []Ulcers   []Wounds Psychological:  []History of anxiety   [] History of major depression.  Physical Examination  BP 128/71   Pulse 74   Ht 5' 5" (1.651 m)   Wt 129 lb (58.5 kg)   BMI 21.47 kg/m  Gen:  WD/WN, NAD. Appears much younger than stated age. Head: Terminous/AT, No temporalis wasting. Ear/Nose/Throat: Hearing grossly intact, nares w/o erythema or drainage Eyes: Conjunctiva clear. Sclera non-icteric Neck: Supple.  Trachea midline Pulmonary:  Good air movement, no use of accessory muscles.  Cardiac: RRR, no JVD Vascular:  Vessel Right Left  Radial Palpable Palpable           Musculoskeletal: M/S 5/5 throughout.  No deformity or atrophy. No edema. Neurologic: Sensation grossly intact in extremities.  Symmetrical.  Speech is fluent.   Psychiatric: Judgment intact, Mood & affect appropriate for pt's clinical situation. Dermatologic: No rashes or ulcers noted.  No cellulitis or open wounds.      Labs Recent Results (from the past 2160 hour(s))  Basic metabolic panel     Status: None   Collection Time: 05/09/21 11:59 AM  Result Value Ref Range   Glucose 92 65 - 99 mg/dL    Comment:                **Effective May 13, 2021 Glucose reference**                  interval will be changing to:                                                             70 - 99    BUN 14 8 - 27 mg/dL   Creatinine, Ser 0.85 0.57 - 1.00 mg/dL   eGFR 66 >59 mL/min/1.73   BUN/Creatinine Ratio 16 12 - 28   Sodium 137 134 - 144 mmol/L   Potassium 4.3 3.5 - 5.2 mmol/L   Chloride 100 96 - 106 mmol/L   CO2 24 20 - 29 mmol/L   Calcium 8.9 8.7 - 10.3 mg/dL  CBC     Status: Abnormal   Collection Time: 05/09/21 11:59 AM  Result Value Ref Range   WBC 8.5 3.4 - 10.8 x10E3/uL   RBC 4.99 3.77 - 5.28 x10E6/uL   Hemoglobin 12.7 11.1 - 15.9 g/dL   Hematocrit 41.2 34.0 - 46.6 %   MCV 83 79 - 97 fL   MCH 25.5 (L) 26.6 - 33.0 pg   MCHC 30.8 (L) 31.5 - 35.7 g/dL   RDW 13.9  11.7 - 15.4 %   Platelets 225 150 - 450 x10E3/uL  Comprehensive metabolic panel     Status: Abnormal   Collection Time: 05/30/21 10:40 AM  Result Value Ref Range   Glucose 82 70 - 99 mg/dL    Comment:               **Please note reference interval change**   BUN 20 8 - 27 mg/dL   Creatinine, Ser 0.90 0.57 - 1.00 mg/dL   eGFR 62 >59 mL/min/1.73   BUN/Creatinine Ratio 22 12 - 28   Sodium 141 134 - 144 mmol/L   Potassium 3.7 3.5 - 5.2 mmol/L   Chloride 102 96 - 106 mmol/L   CO2 16 (L) 20 - 29 mmol/L   Calcium 9.1 8.7 - 10.3 mg/dL   Total Protein 6.7 6.0 - 8.5 g/dL   Albumin 4.3 3.6 - 4.6 g/dL   Globulin, Total 2.4 1.5 - 4.5 g/dL   Albumin/Globulin Ratio 1.8 1.2 - 2.2   Bilirubin Total 0.3 0.0 - 1.2 mg/dL   Alkaline Phosphatase 95 44 - 121 IU/L   AST 37 0 - 40 IU/L    ALT 43 (H) 0 - 32 IU/L  POCT INR     Status: Abnormal   Collection Time: 06/12/21 11:04 AM  Result Value Ref Range   INR 5.8 (A) 2.0 - 3.0  ECHO TEE     Status: None   Collection Time: 06/13/21  8:33 AM  Result Value Ref Range   Area-P 1/2 1.73 cm2    Radiology ECHO TEE  Result Date: 06/14/2021    TRANSESOPHOGEAL ECHO REPORT   Patient Name:   Molly Hodges Date of Exam: 06/13/2021 Medical Rec #:  [id number]          Height:       65.0 in Accession #:    [id number]         Weight:       129.0 lb Date of Birth:  09/10/1931           BSA:          1.642 m Patient Age:    72 years           BP:           160/55 mmHg Patient Gender: F                  HR:           61 bpm. Exam Location:  [facility] Procedure: Transesophageal Echo, Cardiac Doppler, Color Doppler and 3D Echo Indications:     Mitral steosis with regurgitation  History:         Patient has prior history of Echocardiogram examinations, most                  recent 12/13/2020. Risk Factors:Hypertension. Mitral                  regurgitation.  Sonographer:     Sherrie Sport Referring Phys:  [id number] Rise Mu Diagnosing Phys: Ida Rogue MD PROCEDURE: After discussion of the risks and benefits of a TEE, an informed consent was obtained from the patient. The transesophogeal probe was passed without difficulty through the esophogus of the patient. Local oropharyngeal anesthetic was provided with Benzocaine spray and Cetacaine. Sedation performed by performing physician. Image quality was excellent. The patient's vital signs; including heart rate, blood pressure, and oxygen saturation; remained stable throughout the procedure. The patient  developed no complications during the procedure. IMPRESSIONS  1. Left ventricular ejection fraction, by estimation, is 55 to 60%. The left ventricle has normal function. The left ventricle has no regional wall motion abnormalities.  2. Right ventricular systolic function is normal. The right ventricular size is  normal.  3. The mitral valve is degenerative. Moderate mitral valve regurgitation. Moderate mitral stenosis. Moderate mitral annular calcification. Estimated MVA by P1/2t : 1.61 cm sq, Mean gradient 9 mm Hg,  4. Agitated saline contrast bubble study was negative, with no evidence of any interatrial shunt.  5. Left atrial size was moderately dilated. No left atrial/left atrial appendage thrombus was detected.  6. There is Moderate (Grade III) atheroma plaque involving the ascending, transverse and descending aorta. Conclusion(s)/Recommendation(s): Normal biventricular function without evidence of hemodynamically significant valvular heart disease. FINDINGS  Left Ventricle: Left ventricular ejection fraction, by estimation, is 55 to 60%. The left ventricle has normal function. The left ventricle has no regional wall motion abnormalities. The left ventricular internal cavity size was normal in size. There is  no left ventricular hypertrophy. Right Ventricle: The right ventricular size is normal. No increase in right ventricular wall thickness. Right ventricular systolic function is normal. Left Atrium: Left atrial size was moderately dilated. No left atrial/left atrial appendage thrombus was detected. Right Atrium: Right atrial size was normal in size. Pericardium: There is no evidence of pericardial effusion. Mitral Valve: The mitral valve is degenerative in appearance. There is moderate thickening of the anterior and posterior mitral valve leaflet(s). Moderate mitral annular calcification. Moderate mitral valve regurgitation. Moderate mitral valve stenosis. MV peak gradient, 23.6 mmHg. The mean mitral valve gradient is 9.0 mmHg. Tricuspid Valve: The tricuspid valve is normal in structure. Tricuspid valve regurgitation is not demonstrated. No evidence of tricuspid stenosis. Aortic Valve: The aortic valve is normal in structure. Aortic valve regurgitation is not visualized. No aortic stenosis is present. Pulmonic  Valve: The pulmonic valve was normal in structure. Pulmonic valve regurgitation is not visualized. No evidence of pulmonic stenosis. Aorta: The aortic root is normal in size and structure. There is moderate (Grade III) atheroma plaque involving the ascending, transverse and descending aorta. Venous: The inferior vena cava is normal in size with greater than 50% respiratory variability, suggesting right atrial pressure of 3 mmHg. IAS/Shunts: No atrial level shunt detected by color flow Doppler. Agitated saline contrast was given intravenously to evaluate for intracardiac shunting. Agitated saline contrast bubble study was negative, with no evidence of any interatrial shunt. There  is no evidence of a patent foramen ovale. There is no evidence of an atrial septal defect.  MITRAL VALVE MV Area (PHT): 1.73 cm MV Peak grad:  23.6 mmHg MV Mean grad:  9.0 mmHg MV Vmax:       2.43 m/s MV Vmean:      137.5 cm/s MV Decel Time: 438 msec MV E velocity: 235.50 cm/s MV A velocity: 97.25 cm/s MV E/A ratio:  2.42 Ida Rogue MD Electronically signed by Ida Rogue MD Signature Date/Time: 06/14/2021/6:44:19 PM    Final     Assessment/Plan  Carotid stenosis, right Carotid duplex today demonstrates velocities just into the 40 to 59% range bilaterally with a stent on the right that appears patent.  No change from study 6 months ago.  No role for intervention.  Continue current medical regimen.  Recheck in 6 months.  Hypertension blood pressure control important in reducing the progression of atherosclerotic disease. On appropriate oral medications.   Hyperlipidemia lipid control important  in reducing the progression of atherosclerotic disease. Continue statin therapy    Leotis Pain, MD  06/18/2021 12:14 PM    This note was created with Dragon medical transcription system.  Any errors from dictation are purely unintentional

## 2021-06-18 NOTE — Assessment & Plan Note (Signed)
blood pressure control important in reducing the progression of atherosclerotic disease. On appropriate oral medications.  

## 2021-06-19 ENCOUNTER — Ambulatory Visit (INDEPENDENT_AMBULATORY_CARE_PROVIDER_SITE_OTHER): Payer: Medicare Other

## 2021-06-19 DIAGNOSIS — I480 Paroxysmal atrial fibrillation: Secondary | ICD-10-CM

## 2021-06-19 DIAGNOSIS — I050 Rheumatic mitral stenosis: Secondary | ICD-10-CM

## 2021-06-19 DIAGNOSIS — Z5181 Encounter for therapeutic drug level monitoring: Secondary | ICD-10-CM

## 2021-06-19 LAB — POCT INR: INR: 2.1 (ref 2.0–3.0)

## 2021-06-19 NOTE — Patient Instructions (Signed)
-   continue dosage of 1/2 tablet warfarin every day - Recheck INR next week

## 2021-06-20 ENCOUNTER — Encounter: Payer: Medicare Other | Admitting: Surgery

## 2021-06-20 ENCOUNTER — Other Ambulatory Visit: Payer: Self-pay

## 2021-06-20 ENCOUNTER — Institutional Professional Consult (permissible substitution): Payer: Medicare Other | Admitting: Surgery

## 2021-06-20 ENCOUNTER — Encounter: Payer: Self-pay | Admitting: Surgery

## 2021-06-20 VITALS — BP 127/61 | HR 73 | Resp 20 | Ht 65.0 in | Wt 129.0 lb

## 2021-06-20 DIAGNOSIS — I2510 Atherosclerotic heart disease of native coronary artery without angina pectoris: Secondary | ICD-10-CM | POA: Diagnosis not present

## 2021-06-20 DIAGNOSIS — I059 Rheumatic mitral valve disease, unspecified: Secondary | ICD-10-CM

## 2021-06-20 NOTE — Progress Notes (Signed)
Cardiothoracic Surgery Consultation   PCP is Fisher, Kirstie Peri, MD Referring Provider is Minna Merritts, MD  Chief Complaint  Patient presents with   Coronary Artery Disease   Mitral Regurgitation    Surgical consult, Cardiac Cath 05/17/21, TEE 06/13/21    HPI:  The patient is an 87 year old woman with a history of hypertension, hyperlipidemia, paroxysmal atrial fibrillation status post cardioversion in 2017 on Coumadin, asthma, carotid artery stenosis status post right carotid stenting in 07/2020, and moderate to severe mitral stenosis with moderate mitral regurgitation by echocardiogram in April 2022.  The mean gradient at that time was 10 mmHg with a peak gradient of 31.5 mmHg.  Left ventricular ejection fraction was 55 to 60%.  She has a history of shortness of breath over the past 6 to 12 months and was seen by pulmonary medicine in April 2022.  She was not felt to have COPD although she refused pulmonary function testing.  She was noted to have nocturnal oxygen desaturation and was started on oxygen at 1.5 L at night.  It was felt that her shortness of breath was likely cardiac in origin and she was referred back to cardiology.  She underwent cardiac catheterization on 05/17/2021 which showed severe mitral valve stenosis with a mean gradient of 13 to 17 mmHg.  There was moderate mitral regurgitation.  There was moderate to severe pulmonary hypertension with a mean PA pressure of 42 mmHg.  PVR was 4.7 Wood units.  There was 80 to 90% proximal to mid LAD stenosis involving a large first diagonal branch which also had about 80% stenosis in its proximal segment.  She was started on Lasix 40 mg daily with improvement in her shortness of breath.  She underwent TEE on 06/13/2021 which showed a degenerative appearing mitral valve with moderate thickening and mitral annular calcification.  There was moderate mitral valve regurgitation with multiple jets as well as moderate mitral valve stenosis with  a mean gradient of 9 mmHg and a peak gradient of 23.6 mmHg.  Right ventricular function was normal.  Left ventricular ejection fraction was 55 to 60%.  There was moderate atheromatous plaque involving the ascending, transverse, and descending aorta.  The patient is here today with her granddaughter.  She said that she feels significantly better since being started on Lasix.  She is able to do her normal daily activities without shortness of breath.  She denies peripheral edema.  She has had no orthopnea or PND.  She denies any chest pain or pressure.  She continues to have occasional episodes of dizziness.  She said that she does not feel like she is going to pass out but has difficulty with her balance and walking straight.  She does not feel like it is vertigo.  An extensive cardiac, ENT, and neurologic work-up have been inconclusive as to the cause of this.  She also has peripheral neuropathy.  She lives at home with her husband who is not in good health and she takes care of him.  Past Medical History:  Diagnosis Date   Asthma    Carotid artery disease ([place])    Community acquired pneumonia    Essential hypertension    Glaucoma    Hyperlipidemia    Mitral regurgitation    a. TTE 08/2015: EF 60-65%, normal wall motion, mild MR, mildly dilated left atrium measuring 40 mm, RVSF normal, PASP normal   PAF (paroxysmal atrial fibrillation) ([place]) 09/17/2015   a. s/p DCCV 11/15/2015; CHADS2VASc => 4 (  HTN, age x 2, female); c. on Eliquis    Past Surgical History:  Procedure Laterality Date   ABDOMINAL HYSTERECTOMY  1987   due to heavy bleeding   APPENDECTOMY     CAROTID PTA/STENT INTERVENTION Right 08/15/2020   Procedure: CAROTID PTA/STENT INTERVENTION;  Surgeon: Algernon Huxley, MD;  Location: [facility];  Service: Cardiovascular;  Laterality: Right;   CATARACT EXTRACTION     ELECTROPHYSIOLOGIC STUDY N/A 11/15/2015   Procedure: CARDIOVERSION;  Surgeon: Minna Merritts, MD;  Location: [facility]  [facility];  Service: Cardiovascular;  Laterality: N/A;   RIGHT/LEFT HEART CATH AND CORONARY ANGIOGRAPHY N/A 05/17/2021   Procedure: RIGHT/LEFT HEART CATH AND CORONARY ANGIOGRAPHY;  Surgeon: Nelva Bush, MD;  Location: [facility];  Service: Cardiovascular;  Laterality: N/A;   TEE WITHOUT CARDIOVERSION N/A 06/13/2021   Procedure: TRANSESOPHAGEAL ECHOCARDIOGRAM (TEE);  Surgeon: Minna Merritts, MD;  Location: [facility];  Service: Cardiovascular;  Laterality: N/A;    Family History  Problem Relation Age of Onset   CAD Mother    CAD Father    Cancer Son 58       lung cancer    Social History Social History   Tobacco Use   Smoking status: Never   Smokeless tobacco: Never   Tobacco comments:    never   Vaping Use   Vaping Use: Never used  Substance Use Topics   Alcohol use: No   Drug use: Never    Current Outpatient Medications  Medication Sig Dispense Refill   albuterol (PROVENTIL) (2.5 MG/3ML) 0.083% nebulizer solution TAKE 3 MLS (2.5 MG TOTAL) BY NEBULIZATION EVERY 4 HOURS AS NEEDED FORUP TO 12 DOSES FOR WHEEZING OR SHORTNESSOF BREATH 75 mL 5   albuterol (VENTOLIN HFA) 108 (90 Base) MCG/ACT inhaler Inhale 2 puffs into the lungs every 6 (six) hours as needed for wheezing or shortness of breath. 8 g 2   amiodarone (PACERONE) 200 MG tablet TAKE 1 TABLET EVERY DAY. 90 tablet 3   apixaban (ELIQUIS) 2.5 MG TABS tablet Take 1 tablet (2.5 mg total) by mouth 2 (two) times daily. 180 tablet 1   budesonide-formoterol (SYMBICORT) 160-4.5 MCG/ACT inhaler Inhale 2 puffs into the lungs 2 (two) times daily. (Patient taking differently: Inhale 2 puffs into the lungs 2 (two) times daily as needed (asthma).) 1 Inhaler 12   Calcium Carbonate-Vitamin D 600-400 MG-UNIT tablet Take 1 tablet by mouth daily.     cholecalciferol (VITAMIN D) 1000 UNITS tablet Take 1,000 Units by mouth daily.     diltiazem (CARDIZEM CD) 180 MG 24 hr capsule TAKE 1 CAPSULE EVERY DAY 30 capsule 11   dorzolamide  (TRUSOPT) 2 % ophthalmic solution Place 1 drop into both eyes 2 (two) times daily.      ezetimibe (ZETIA) 10 MG tablet TAKE 1 TABLET BY MOUTH DAILY. 90 tablet 3   furosemide (LASIX) 40 MG tablet Take 1 tablet (40 mg total) by mouth daily. 30 tablet 5   ipratropium-albuterol (DUONEB) 0.5-2.5 (3) MG/3ML SOLN Take 3 mLs by nebulization every 4 (four) hours as needed. 360 mL 1   latanoprost (XALATAN) 0.005 % ophthalmic solution Place 1 drop into both eyes at bedtime.      levalbuterol (XOPENEX HFA) 45 MCG/ACT inhaler Inhale 2 puffs into the lungs every 4 (four) hours as needed for wheezing or shortness of breath.     magnesium oxide (MAG-OX) 400 MG tablet Take 400 mg by mouth daily.     Multiple Vitamins-Minerals (PRESERVISION AREDS 2+MULTI  VIT) CAPS Take 1 capsule by mouth in the morning and at bedtime.     nitroGLYCERIN (NITROSTAT) 0.4 MG SL tablet Place 1 tablet (0.4 mg total) under the tongue every 5 (five) minutes as needed (for chest pain or shortness of breath). 100 tablet 3   Omega-3 Fatty Acids (FISH OIL BURP-LESS) 1000 MG CAPS Take 1,000 mg by mouth daily.      potassium chloride (KLOR-CON) 10 MEQ tablet TAKE 1 TABLET BY MOUTH DAILY 90 tablet 0   pregabalin (LYRICA) 50 MG capsule TAKE 1 CAPSULE BY MOUTH 2 TIMES DAILY 60 capsule 5   rosuvastatin (CRESTOR) 20 MG tablet TAKE 1 TABLET BY MOUTH DAILY. 90 tablet 3   vitamin C (ASCORBIC ACID) 500 MG tablet Take 500 mg by mouth daily.     vitamin E 100 UNIT capsule Take 200 Units by mouth daily.     warfarin (COUMADIN) 5 MG tablet Take 1 tablet (5 mg total) by mouth daily. Start 10/22, take with Eliquis. See Coumadin nurse 10/26 20 tablet 0   Current Facility-Administered Medications  Medication Dose Route Frequency Provider Last Rate Last Admin   sodium chloride flush (NS) 0.9 % injection 3 mL  3 mL Intravenous Q12H Dunn, Ryan M, PA-C        Allergies  Allergen Reactions   Shellfish Allergy Anaphylaxis    Other reaction(s): Hallucination    Azithromycin Other (See Comments)    Extreme burning sensation at IV site Other reaction(s): Unknown   Tamiflu [Oseltamivir Phosphate] Other (See Comments)    Reaction:  Hallucinations     Albuterol Palpitations    Heart racing.     Review of Systems  Constitutional:  Positive for fatigue. Negative for activity change.  HENT: Negative.    Eyes: Negative.   Respiratory:  Positive for shortness of breath.        With moderate exertion  Cardiovascular:  Negative for chest pain, palpitations and leg swelling.  Gastrointestinal: Negative.   Endocrine: Negative.   Genitourinary: Negative.   Musculoskeletal: Negative.   Skin: Negative.   Allergic/Immunologic: Negative.   Neurological:  Positive for dizziness. Negative for syncope.       Peripheral neuropathy in lower extremities  Hematological: Negative.   Psychiatric/Behavioral: Negative.     BP 127/61 (BP Location: Right Arm, Patient Position: Sitting, Cuff Size: Normal)   Pulse 73   Resp 20   Ht 5\' 5"  (1.651 m)   Wt 129 lb (58.5 kg)   SpO2 98% Comment: RA  BMI 21.47 kg/m  Physical Exam Constitutional:      Comments: Thin elderly woman in no distress  HENT:     Head: Normocephalic and atraumatic.  Eyes:     Extraocular Movements: Extraocular movements intact.     Conjunctiva/sclera: Conjunctivae normal.     Pupils: Pupils are equal, round, and reactive to light.  Cardiovascular:     Rate and Rhythm: Normal rate and regular rhythm.     Pulses: Normal pulses.     Heart sounds: Murmur heard.     Comments: 2/6 systolic murmur left lower sternal border Pulmonary:     Effort: Pulmonary effort is normal.     Breath sounds: Normal breath sounds.  Abdominal:     General: Abdomen is flat.     Palpations: Abdomen is soft.  Musculoskeletal:        General: No swelling.     Cervical back: Normal range of motion and neck supple.  Skin:    General:  Skin is warm and dry.  Neurological:     General: No focal deficit present.      Mental Status: She is alert and oriented to person, place, and time.  Psychiatric:        Mood and Affect: Mood normal.        Behavior: Behavior normal.     Diagnostic Tests:  Physicians  Panel Physicians Referring Physician Case Authorizing Physician  End, Christopher, MD (Primary)     Procedures  RIGHT/LEFT HEART CATH AND CORONARY ANGIOGRAPHY   Conclusion  Conclusions: Severe single-vessel coronary artery disease with 80-90% proximal/mid LAD stenosis involving large D1 branch.  D1 also contains an 80% stenosis in its proximal segment.  No significant disease seen in ramus intermedius, LCx, and RCA. Mildly elevated right heart and left ventricular pressures (mean RA 11 mmHg, LVEDP 20 mmHg). Moderately-severely elevated pulmonary artery pressure (mean PA 42 mmHg) with elevated pulmonary vascular resistance (PVR 4.7 WU). Severe mitral valve stenosis, which may be accentuated by at least moderate mitral regurgitation based on PCWP tracing) mean gradient 13-17 mmHg, MVA 0.5-0.6 cm).   Recommendations: Consider TEE for further evaluation of mitral valve disease. Case discussed with Dr. Rockey Situ.  We have agreed to refer the patient to cardiac surgery for further evaluation for potential mitral valve intervention and CABG. Add furosemide 40 mg p.o. daily.  Basic metabolic panel should be checked when the patient follows up in the office in 2 weeks. Aggressive secondary prevention of coronary artery disease. Restart apixaban 2.5 mg twice daily tomorrow morning if no evidence of bleeding/vascular injury.  Given greater than moderate mitral stenosis, transition to warfarin should be considered in the future in the setting of valvular atrial fibrillation.   Nelva Bush, MD [facility]     Recommendations  Antiplatelet/Anticoag Recommend to resume Apixaban, at currently prescribed dose and frequency on 05/18/2021. Concurrent antiplatelet therapy not recommended.  Discharge  Date In the absence of any other complications or medical issues, we expect the patient to be ready for discharge from a cath perspective on 05/17/2021.   Indications  Nonrheumatic mitral valve stenosis [I34.2 (ICD-10-CM)]  Dyspnea on exertion [R06.00 (ICD-10-CM)]   Procedural Details  Technical Details Indication: 87 y.o. year-old woman with history of PAF status post DCCV in 10/2015, moderate to severe mitral stenosis, pericardial effusion, carotid artery stenosis status post right-sided stenting in 07/2020, asthma, HTN, and chronic dizziness, presenting for evaluation of worsening dyspnea on exertion.  Heart Failure (start of procedure): NYHA class III  GFR: >60 ml/min  Procedure: The risks, benefits, complications, treatment options, and expected outcomes were discussed with the patient. The patient and/or family concurred with the proposed plan, giving informed consent. The right wrist and elbow prepped and draped in a sterile fashion. 1% lidocaine was used for local anesthesia.  Ultrasound was used to evaluate the right basilic veini. It was patent.  A micropuncture needle was used to access the right basilic vein under ultrasound guidance. A 7F slender Glidesheath was inserted using modified Seldinger technique. Right heart catheterization was performed by advancing a 7F balloon-tipped catheter through the right heart chambers into the pulmonary capillary wedge position. Pressure measurements and oxygen saturations were obtained.  Using the modified Seldinger access technique, a 21F slender Glidesheath was placed in the right radial artery. 3 mg Verapamil was given through the sheath. Heparin 3,000 units were administered.  Selective coronary angiography was performed using a 7F JL3.5 catheter to engage the left coronary artery and a 7F JR4 catheter  to engage the right coronary artery. Left heart catheterization was performed using a 61F JR4 catheter. Simultaneous left ventricular and pulmonary  capillary wedge pressures were recorded. Left ventriculogram was not performed.  At the end of the procedure, the radial artery sheath was removed and a TR band applied to achieve patent hemostasis. The basilic vein sheath was removed and hemostasis achieved with manual compression.  There were no immediate complications. The patient was taken to the recovery area in stable condition.   Estimated blood loss <50 mL.   During this procedure medications were administered to achieve and maintain moderate conscious sedation while the patient's heart rate, blood pressure, and oxygen saturation were continuously monitored and I was present face-to-face 100% of this time.   Medications (Filter: Administrations occurring from 2426 to 0904 on 05/17/21)  important  Continuous medications are totaled by the amount administered until 05/17/21 0904.   midazolam (VERSED) injection (mg) Total dose:  0.5 mg Date/Time Rate/Dose/Volume Action   05/17/21 0805 0.5 mg Given    fentaNYL (SUBLIMAZE) injection (mcg) Total dose:  12.5 mcg Date/Time Rate/Dose/Volume Action   05/17/21 0805 12.5 mcg Given    lidocaine (PF) (XYLOCAINE) 1 % injection (mL) Total volume:  5 mL Date/Time Rate/Dose/Volume Action   05/17/21 0822 5 mL Given    Heparin (Porcine) in NaCl 2000-0.9 UNIT/L-% SOLN (mL) Total volume:  1,000 mL Date/Time Rate/Dose/Volume Action   05/17/21 0822 1,000 mL Given    heparin sodium (porcine) injection (Units) Total dose:  3,000 Units Date/Time Rate/Dose/Volume Action   05/17/21 0827 3,000 Units Given    nitroGLYCERIN 1 mg/10 mL (100 mcg/mL) - IR/CATH LAB (mcg) Total dose:  200 mcg Date/Time Rate/Dose/Volume Action   05/17/21 0843 200 mcg Given    iohexol (OMNIPAQUE) 350 MG/ML injection (mL) Total volume:  40 mL Date/Time Rate/Dose/Volume Action   05/17/21 0856 40 mL Given    verapamil (ISOPTIN) injection (mg) Total dose:  2.5 mg Date/Time Rate/Dose/Volume Action   05/17/21 0823 2.5  mg Given    Sedation Time  Sedation Time Physician-1: 45 minutes 34 seconds Contrast  Medication Name Total Dose  iohexol (OMNIPAQUE) 350 MG/ML injection 40 mL   Radiation/Fluoro  Fluoro time: 6.1 (min) DAP: 22 (Gycm2) Cumulative Air Kerma: 834 (mGy) Complications  Complications documented before study signed (05/17/2021  1:96 AM)   No complications were associated with this study.  Documented by Nelva Bush, MD - 05/17/2021  9:43 AM     Coronary Findings  Diagnostic Dominance: Right Left Main  Vessel is large. Vessel is angiographically normal.  Left Anterior Descending  Vessel is large.  Prox LAD to Mid LAD lesion is 85% stenosed. The lesion is tubular. No improvement with intracoronary nitroglycerin.  First Diagonal Branch  Vessel is large in size.  1st Diag lesion is 80% stenosed. The lesion is tubular. No improvement with intracoronary nitroglycerin.  Second Diagonal Branch  Vessel is small in size.  Third Diagonal Branch  Vessel is small in size.  Ramus Intermedius  Vessel is moderate in size. Vessel is angiographically normal.  Left Circumflex  Vessel is moderate in size. Vessel is angiographically normal.  First Obtuse Marginal Branch  Vessel is small in size.  Second Obtuse Marginal Branch  Vessel is small in size.  Third Obtuse Marginal Branch  Vessel is small in size.  Right Coronary Artery  Vessel is large. Vessel is angiographically normal.  Right Posterior Descending Artery  Vessel is moderate in size.  Right Posterior Atrioventricular Artery  Vessel is moderate in size.  First Right Posterolateral Branch  Vessel is small in size.  Intervention  No interventions have been documented. Right Heart  Right Heart Pressures RA (mean): 11 mmHg RV (S/EDP): 23/12 mmHg PA (S/D, mean): 73/27 (42) mmHg PCWP (mean): 28 mmHg (prominent V-waves up to 51 mmHg)  Ao sat: 97% PA sat: 68%  Fick CO: 3.0 L/min Fick CI: 1.9 L/min/m^2  PVR: 4.7 Wood  units   Left Heart  Left Ventricle LV end diastolic pressure is mildly elevated. LVEDP 20 mmHg.  Mitral Valve There is severe mitral valve stenosis. Mean gradient: 13-17 mmHg. Mitral valve area: 0.5-0.6 cm^2.  There is at least moderate mitral regurgitation based on PCWP tracing with prominent V-waves up to ~50 mmHg.   Coronary Diagrams  Diagnostic Dominance: Right Intervention  Implants     No implant documentation for this case.   Syngo Images   Show images for CARDIAC CATHETERIZATION Images on Long Term Storage   Show images for [place], [place] to Procedure Log  Procedure Log    Hemo Data (last day) before discharge  AO Systolic Cath Pressure AO Diastolic Cath Pressure AO Mean Cath Pressure LV Systolic Cath Pressure LV End Diastolic LV Systolic LV End Diastolic LV dP/dt PA Systolic Cath Pressure PA Diastolic Cath Pressure PA Mean Cath Pressure RA Wedge A Wave RA Wedge V Wave RV Systolic Cath Pressure RV Diastolic Cath Pressure RV End Diastolic RV Systolic RV End Diastolic RV dP/dt PCW A Wave PCW V Wave PCW Mean AO O2 Sat PA O2 Sat AO O2 Sat Fick C.O. Fick C.I.  -- -- -- -- -- 175 mmHg 18 mmHg 1776 mmHg/sec -- -- -- 11 mmHg 10 mmHg -- -- -- 66 mmHg 12 mmHg 480 mmHg/sec 30 mmHg 46 mmHg 32 mmHg -- -- -- 3.03 L/min 1.85 L/min/m2  -- -- -- -- -- -- -- -- -- -- -- -- -- 65 mmHg 7 mmHg 12 mmHg -- -- -- -- -- -- -- -- -- -- --  -- -- -- -- -- -- -- -- -- -- -- -- -- 66 mmHg 6 mmHg 12 mmHg -- -- -- -- -- -- -- -- -- -- --  -- -- -- -- -- -- -- -- 66 mmHg 22 mmHg 42 mmHg -- -- -- -- -- -- -- -- -- -- -- -- -- -- -- --  -- -- -- 163 mmHg 20 mmHg -- -- -- -- -- -- -- -- -- -- -- -- -- -- -- -- -- -- -- -- -- --  -- -- -- 170 mmHg 16 mmHg -- -- -- -- -- -- -- -- -- -- -- -- -- -- -- -- -- -- -- -- -- --  -- -- -- 171 mmHg 17 mmHg -- -- -- -- -- -- -- -- -- -- -- -- -- -- -- -- -- -- -- -- -- --  -- -- -- 163 mmHg 16 mmHg -- -- -- -- -- -- -- -- -- -- -- -- -- -- -- -- -- -- -- --  -- --  -- -- -- -- -- -- -- -- -- -- -- -- -- -- -- -- -- -- -- -- -- -- -- PA -- -- --  -- -- -- -- -- -- -- -- -- -- -- -- -- -- -- -- -- -- -- -- -- -- 96.5 % -- SA -- --  -- -- -- 175 mmHg 18 mmHg -- -- -- -- -- -- -- -- -- -- -- -- -- -- -- -- -- -- -- -- -- --  177 54 mmHg 98 mmHg -- -- -- -- -- -- -- -- -- -- -- -- -- -- -- -- -- -- -- -- -- -- -- --  160 60 mmHg 99 mmHg -- -- -- -- -- -- -- -- -- -- -- -- -- -- -- -- -- -- -- -- -- -- -- --  164 56 mmHg 103 mmHg -- -- -- -- -- -- -- --                    TRANSESOPHOGEAL ECHO REPORT         Patient Name:   MARYKAY MCCLEOD Date of Exam: 06/13/2021  Medical Rec #:  [id number]          Height:       65.0 in  Accession #:    [id number]         Weight:       129.0 lb  Date of Birth:  05/18/1932           BSA:          1.642 m  Patient Age:    13 years           BP:           160/55 mmHg  Patient Gender: F                  HR:           61 bpm.  Exam Location:  [facility]   Procedure: Transesophageal Echo, Cardiac Doppler, Color Doppler and 3D  Echo   Indications:     Mitral steosis with regurgitation     History:         Patient has prior history of Echocardiogram examinations,  most                   recent 12/13/2020. Risk Factors:Hypertension. Mitral                   regurgitation.     Sonographer:     Sherrie Sport  Referring Phys:  [id number] [place]  Diagnosing Phys: Ida Rogue MD   PROCEDURE: After discussion of the risks and benefits of a TEE, an  informed consent was obtained from the patient. The transesophogeal probe  was passed without difficulty through the esophogus of the patient. Local  oropharyngeal anesthetic was provided  with Benzocaine spray and Cetacaine. Sedation performed by performing  physician. Image quality was excellent. The patient's vital signs;  including heart rate, blood pressure, and oxygen saturation; remained  stable throughout the procedure. The patient  developed no complications during  the procedure.   IMPRESSIONS     1. Left ventricular ejection fraction, by estimation, is 55 to 60%. The  left ventricle has normal function. The left ventricle has no regional  wall motion abnormalities.   2. Right ventricular systolic function is normal. The right ventricular  size is normal.   3. The mitral valve is degenerative. Moderate mitral valve regurgitation.  Moderate mitral stenosis. Moderate mitral annular calcification. Estimated  MVA by P1/2t : 1.61 cm sq, Mean gradient 9 mm Hg,   4. Agitated saline contrast bubble study was negative, with no evidence  of any interatrial shunt.   5. Left atrial size was moderately dilated. No left atrial/left atrial  appendage thrombus was detected.   6. There is Moderate (Grade III) atheroma plaque involving the ascending,  transverse and descending aorta.  Conclusion(s)/Recommendation(s): Normal biventricular function without  evidence of hemodynamically significant valvular heart disease.   FINDINGS   Left Ventricle: Left ventricular ejection fraction, by estimation, is 55  to 60%. The left ventricle has normal function. The left ventricle has no  regional wall motion abnormalities. The left ventricular internal cavity  size was normal in size. There is   no left ventricular hypertrophy.   Right Ventricle: The right ventricular size is normal. No increase in  right ventricular wall thickness. Right ventricular systolic function is  normal.   Left Atrium: Left atrial size was moderately dilated. No left atrial/left  atrial appendage thrombus was detected.   Right Atrium: Right atrial size was normal in size.   Pericardium: There is no evidence of pericardial effusion.   Mitral Valve: The mitral valve is degenerative in appearance. There is  moderate thickening of the anterior and posterior mitral valve leaflet(s).  Moderate mitral annular calcification. Moderate mitral valve  regurgitation. Moderate mitral valve stenosis.   MV peak gradient, 23.6 mmHg. The mean mitral valve gradient is 9.0 mmHg.   Tricuspid Valve: The tricuspid valve is normal in structure. Tricuspid  valve regurgitation is not demonstrated. No evidence of tricuspid  stenosis.   Aortic Valve: The aortic valve is normal in structure. Aortic valve  regurgitation is not visualized. No aortic stenosis is present.   Pulmonic Valve: The pulmonic valve was normal in structure. Pulmonic valve  regurgitation is not visualized. No evidence of pulmonic stenosis.   Aorta: The aortic root is normal in size and structure. There is moderate  (Grade III) atheroma plaque involving the ascending, transverse and  descending aorta.   Venous: The inferior vena cava is normal in size with greater than 50%  respiratory variability, suggesting right atrial pressure of 3 mmHg.   IAS/Shunts: No atrial level shunt detected by color flow Doppler. Agitated  saline contrast was given intravenously to evaluate for intracardiac  shunting. Agitated saline contrast bubble study was negative, with no  evidence of any interatrial shunt. There   is no evidence of a patent foramen ovale. There is no evidence of an  atrial septal defect.      MITRAL VALVE  MV Area (PHT): 1.73 cm  MV Peak grad:  23.6 mmHg  MV Mean grad:  9.0 mmHg  MV Vmax:       2.43 m/s  MV Vmean:      137.5 cm/s  MV Decel Time: 438 msec  MV E velocity: 235.50 cm/s  MV A velocity: 97.25 cm/s  MV E/A ratio:  2.42   Ida Rogue MD  Electronically signed by Ida Rogue MD  Signature Date/Time: 06/14/2021/6:44:19 PM     Impression:  This 87 year old woman has a calcified degenerative mitral valve with moderate mitral stenosis and regurgitation with moderate pulmonary hypertension.  The mean mitral valve gradient is 9 mmHg with a peak gradient of 23.6 mmHg.  She also has severe single-vessel coronary disease with high-grade stenoses in the proximal to mid LAD and large diagonal branch.  She  has no anginal symptoms.  Her symptoms of exertional shortness of breath have significantly improved with diuretic.  I think she would be at high risk for complications and death with mitral valve replacement and coronary bypass surgery and would be unlikely to return to a functional independent state at home.  This is based on her age of 65 years, diffuse atherosclerotic disease with prior carotid stenting and significant atherosclerotic plaque in the ascending, arch, and descending  aorta, chronic unexplained dizziness and peripheral neuropathy that inhibits her ability to walk at times and the magnitude of the surgery.  She is doing relatively well with this time on medical therapy and is doing what she usually does at home and able to look after her husband.  I would recommend continued medical therapy for her.  I reviewed the catheterization and TEE images with her and her granddaughter and answered all of their questions.  I explained the benefits and risks of surgery and my recommendation for continued medical therapy.  She and her granddaughter were in full agreement.  Plan:  She will continue to follow-up with Dr. Rockey Situ and her PCP.  I spent 60 minutes performing this consultation and > 50% of this time was spent face to face counseling and coordinating the care of this patient's moderate mitral stenosis and regurgitation with severe single-vessel coronary disease.   Gaye Pollack, MD [facility] [phone]

## 2021-06-21 DIAGNOSIS — R0902 Hypoxemia: Secondary | ICD-10-CM | POA: Diagnosis not present

## 2021-06-22 NOTE — H&P (Signed)
H&P Addendum, pre-TEE  Patient was seen and evaluated prior to -TEE procedure Symptoms, prior testing details again confirmed with the patient Patient examined, no significant change from prior exam Lab work reviewed in detail personally by myself Patient understands risk and benefit of the procedure,  Patient willing to proceed.  Signed, Esmond Plants, MD, Ph.D [facility]

## 2021-06-22 NOTE — Interval H&P Note (Signed)
History and Physical Interval Note:  06/22/2021 8:31 PM  Molly Hodges  has presented today for surgery, with the diagnosis of TEE   Mitral valve stenosis.  The various methods of treatment have been discussed with the patient and family. After consideration of risks, benefits and other options for treatment, the patient has consented to  Procedure(s): TRANSESOPHAGEAL ECHOCARDIOGRAM (TEE) (N/A) as a surgical intervention.  The patient's history has been reviewed, patient examined, no change in status, stable for surgery.  I have reviewed the patient's chart and labs.  Questions were answered to the patient's satisfaction.     Ida Rogue

## 2021-06-25 NOTE — Progress Notes (Signed)
Cardiology Office Note    Date:  06/28/2021   ID:  Molly Hodges, DOB Aug 20, 1931, MRN [id number]  PCP:  Birdie Sons, MD  Cardiologist:  Ida Rogue, MD  Electrophysiologist:  None   Chief Complaint: Follow-up  History of Present Illness:   Molly Hodges is a 87 y.o. female with history of recently diagnosed CAD by LHC as outlined below, PAF status post DCCV in 10/2015, moderate mitral stenosis, pericardial effusion, pulmonary hypertension, carotid artery stenosis status post right-sided stenting in 07/2020, asthma, HTN, and chronic dizziness who presents for follow-up of TEE.   With regards to her A. fib, she was diagnosed with this in 08/2015 in the context of pneumonia.  She was started on Eliquis and rate controlled.  Echo at that time showed an EF of 60 to 65%, normal wall motion, mild mitral regurgitation, mildly dilated left atrium measuring 40 mm, normal RV systolic function, and normal PASP.  She subsequently underwent a DCCV in 10/2015.  Outpatient cardiac monitoring in 05/2020 showed a predominant rhythm of sinus with an average heart rate of 64 bpm, second-degree AV block type I was present with frequent 2-second pauses predominantly occurring at nighttime.  Echo in 07/2020 showed an EF of 55 to 60%, no regional wall motion abnormalities, indeterminate LV diastolic function parameters, normal RV systolic function and ventricular cavity size, mildly dilated left atrium, mild to moderate circumferential pericardial effusion, mild to moderate mitral regurgitation, moderate mitral stenosis with a mean gradient of 10 mmHg, and an estimated right atrial pressure of 15 mmHg.  Outpatient cardiac monitoring performed through the PCPs office in 10/2020, for chronic dizziness, showed a predominant rhythm of sinus, average heart rate 65 bpm with a range of 54 to 101 bpm, first-degree AV block, and rare PACs and PVCs.  No significant arrhythmias, prolonged pauses, or high-grade AV block  were noted.  Most recent carotid artery ultrasound from 11/2020 showed bilateral ICA 40 to 59% stenosis with velocities elevated along the right side felt to possibly be in the setting of prior stenting.  Patent RICA stent was noted.  There was bilateral antegrade flow of the vertebral arteries and normal flow hemodynamics of the bilateral subclavian arteries.  She was evaluated by pulmonology in 11/2020 for ongoing shortness of breath which was not felt to be related to pulmonary disease.  They had suspicion for possible pulmonary hypertension or worsening mitral valve disease.  They made note that the patient did not have COPD.  Echo, as ordered by pulmonology on 12/13/2020, showed an EF of 55 to 60%, no regional wall motion abnormalities, grade 2 diastolic dysfunction, low normal RV systolic function with normal RV cavity size and mildly increased RV wall thickness, moderately dilated left atrium, degenerative mitral valve with no evidence of regurgitation and moderate to severe mitral stenosis with a MVA per PHT of 1.7 cm and a mean gradient of 11 mmHg with a heart rate of 70 bpm, no evidence of pericardial effusion, mild aortic valve insufficiency and mild aortic valve sclerosis without evidence of stenosis, and an estimated right atrial pressure of 8 mmHg.  She was seen in the office on 05/09/2021 and continued to note chronic stable dizziness and dyspnea.  Given persistent dyspnea, she underwent R/LHC on 05/17/2021 which demonstrated severe single-vessel CAD with 80 to 90% proximal/mid LAD stenosis involving a large D1 branch.  The D1 also contained an 80% stenosis in its proximal segment.  No significant disease was seen in ramus intermedius, LCx,  and RCA.  Mildly elevated right heart and left ventricular pressures.  Moderately to severely elevated pulmonary artery pressure with a mean PA pressure 42 mmHg with elevated pulmonary vascular resistance.  There was severe mitral valve stenosis which may be extenuated  by at least moderate mitral regurgitation based on PCWP tracing as outlined below.  The case was discussed with her primary cardiologist with recommendation to refer the patient to cardiothoracic surgery for further evaluation of potential mitral valve intervention and CABG.  There was also recommended to consider a TEE for further evaluation of her mitral valve disease.  She was started on furosemide 40 mg daily.  She was last seen in the office on 05/30/2021 and noted an improvement in her dyspnea following initiation of Lasix.  She also noted an improvement in her dizziness.  Her weight was stable.  Given progressive mitral valve stenosis, she was transitioned from Moodus to warfarin.  She underwent TEE on 06/13/2021, to further characterize her mitral stenosis, which demonstrated an EF of 55 to 60%, no regional wall motion abnormalities, normal RV systolic function and ventricular cavity size, degenerative mitral valve with moderate regurgitation, moderate stenosis, and moderate mitral annular calcification.  The estimated mean gradient was 9 mmHg.  Agitated saline bubble contrast study was negative.  Left atrium was moderately dilated with no evidence of left atrial or left atrial appendage thrombus.  There was moderate atheroma plaquing involving the ascending, transverse, and descending aorta.  She comes in doing well from a cardiac perspective.  She continues to note improvement in her dyspnea and dizziness since initiating furosemide.  However, her dizziness does still persist.  This is a longstanding issue.  No symptoms of angina, palpitations, presyncope, or syncope.  No lower extremity swelling, orthopnea, PND, early satiety.  She is tolerating warfarin in place of apixaban without issues.  No falls, hematochezia, or melena.   Labs independently reviewed: 06/26/2021 - INR 2.7 05/2021 - BUN 20, serum creatinine 0.9, potassium 3.7, albumin 4.3, AST normal, ALT 43 04/2021 - Hgb 12.7, PLT 225,  08/2020  - TC 119, TG 58, HDL 66, LDL 40, A1c 6.6 06/2020 - TSH normal  Past Medical History:  Diagnosis Date   Asthma    Carotid artery disease ([place])    Community acquired pneumonia    Essential hypertension    Glaucoma    Hyperlipidemia    Mitral regurgitation    a. TTE 08/2015: EF 60-65%, normal wall motion, mild MR, mildly dilated left atrium measuring 40 mm, RVSF normal, PASP normal   PAF (paroxysmal atrial fibrillation) ([place]) 09/17/2015   a. s/p DCCV 11/15/2015; CHADS2VASc => 4 (HTN, age x 2, female); c. on Eliquis    Past Surgical History:  Procedure Laterality Date   ABDOMINAL HYSTERECTOMY  1987   due to heavy bleeding   APPENDECTOMY     CARDIAC CATHETERIZATION     CAROTID PTA/STENT INTERVENTION Right 08/15/2020   Procedure: CAROTID PTA/STENT INTERVENTION;  Surgeon: Algernon Huxley, MD;  Location: [facility];  Service: Cardiovascular;  Laterality: Right;   CATARACT EXTRACTION     ELECTROPHYSIOLOGIC STUDY N/A 11/15/2015   Procedure: CARDIOVERSION;  Surgeon: Minna Merritts, MD;  Location: [facility];  Service: Cardiovascular;  Laterality: N/A;   RIGHT/LEFT HEART CATH AND CORONARY ANGIOGRAPHY N/A 05/17/2021   Procedure: RIGHT/LEFT HEART CATH AND CORONARY ANGIOGRAPHY;  Surgeon: Nelva Bush, MD;  Location: [facility];  Service: Cardiovascular;  Laterality: N/A;   TEE WITHOUT CARDIOVERSION N/A 06/13/2021  Procedure: TRANSESOPHAGEAL ECHOCARDIOGRAM (TEE);  Surgeon: Minna Merritts, MD;  Location: [facility];  Service: Cardiovascular;  Laterality: N/A;    Current Medications: Current Meds  Medication Sig   albuterol (VENTOLIN HFA) 108 (90 Base) MCG/ACT inhaler Inhale 2 puffs into the lungs every 6 (six) hours as needed for wheezing or shortness of breath.   amiodarone (PACERONE) 200 MG tablet Take 2 tablets (400 mg total) by mouth 2 (two) times daily for 7 days, THEN 1 tablet (200 mg total) 2 (two) times daily for 7 days, THEN 1 tablet (200 mg total) daily for 16  days.   Calcium Carbonate-Vitamin D 600-400 MG-UNIT tablet Take 1 tablet by mouth daily.   cholecalciferol (VITAMIN D) 1000 UNITS tablet Take 1,000 Units by mouth daily.   diltiazem (CARDIZEM CD) 180 MG 24 hr capsule TAKE 1 CAPSULE EVERY DAY   dorzolamide (TRUSOPT) 2 % ophthalmic solution Place 1 drop into both eyes 2 (two) times daily.    ezetimibe (ZETIA) 10 MG tablet TAKE 1 TABLET BY MOUTH DAILY.   furosemide (LASIX) 40 MG tablet Take 1 tablet (40 mg total) by mouth daily.   ipratropium-albuterol (DUONEB) 0.5-2.5 (3) MG/3ML SOLN Take 3 mLs by nebulization every 4 (four) hours as needed.   latanoprost (XALATAN) 0.005 % ophthalmic solution Place 1 drop into both eyes at bedtime.    levalbuterol (XOPENEX HFA) 45 MCG/ACT inhaler Inhale 2 puffs into the lungs every 4 (four) hours as needed for wheezing or shortness of breath.   magnesium oxide (MAG-OX) 400 MG tablet Take 400 mg by mouth daily.   Multiple Vitamins-Minerals (PRESERVISION AREDS 2+MULTI VIT) CAPS Take 1 capsule by mouth in the morning and at bedtime.   nitroGLYCERIN (NITROSTAT) 0.4 MG SL tablet Place 1 tablet (0.4 mg total) under the tongue every 5 (five) minutes as needed (for chest pain or shortness of breath).   Omega-3 Fatty Acids (FISH OIL BURP-LESS) 1000 MG CAPS Take 1,000 mg by mouth daily.    potassium chloride (KLOR-CON) 10 MEQ tablet TAKE 1 TABLET BY MOUTH DAILY   pregabalin (LYRICA) 50 MG capsule TAKE 1 CAPSULE BY MOUTH 2 TIMES DAILY   rosuvastatin (CRESTOR) 20 MG tablet TAKE 1 TABLET BY MOUTH DAILY.   vitamin C (ASCORBIC ACID) 500 MG tablet Take 500 mg by mouth daily.   vitamin E 100 UNIT capsule Take 200 Units by mouth daily.   warfarin (COUMADIN) 5 MG tablet Take 2.5 mg by mouth daily.   [DISCONTINUED] amiodarone (PACERONE) 200 MG tablet TAKE 1 TABLET EVERY DAY.    Allergies:   Shellfish allergy, Azithromycin, Tamiflu [oseltamivir phosphate], and Albuterol   Social History   Socioeconomic History   Marital status:  Married    Spouse name: Not on file   Number of children: 4   Years of education: Not on file   Highest education level: 12th grade  Occupational History   Occupation: retired  Tobacco Use   Smoking status: Never   Smokeless tobacco: Never   Tobacco comments:    never   Vaping Use   Vaping Use: Never used  Substance and Sexual Activity   Alcohol use: No   Drug use: Never   Sexual activity: Not on file  Other Topics Concern   Not on file  Social History Narrative   Lives at home with Husband. Active and Independent at baseline.   Social Determinants of Health   Financial Resource Strain: Low Risk    Difficulty of Paying Living Expenses: Not hard  at all  Food Insecurity: No Food Insecurity   Worried About Charity fundraiser in the Last Year: Never true   Ran Out of Food in the Last Year: Never true  Transportation Needs: No Transportation Needs   Lack of Transportation (Medical): No   Lack of Transportation (Non-Medical): No  Physical Activity: Inactive   Days of Exercise per Week: 0 days   Minutes of Exercise per Session: 0 min  Stress: No Stress Concern Present   Feeling of Stress : Not at all  Social Connections: Moderately Isolated   Frequency of Communication with Friends and Family: More than three times a week   Frequency of Social Gatherings with Friends and Family: More than three times a week   Attends Religious Services: Never   Marine scientist or Organizations: No   Attends Music therapist: Never   Marital Status: Married     Family History:  The patient's family history includes CAD in her father and mother; Cancer (age of onset: 94) in her son.  ROS:   Review of Systems  Constitutional:  Positive for malaise/fatigue. Negative for chills, diaphoresis, fever and weight loss.  HENT:  Negative for congestion.   Eyes:  Negative for discharge and redness.  Respiratory:  Positive for shortness of breath. Negative for cough, sputum  production and wheezing.   Cardiovascular:  Negative for chest pain, palpitations, orthopnea, claudication, leg swelling and PND.  Gastrointestinal:  Negative for abdominal pain, blood in stool, heartburn, melena, nausea and vomiting.  Musculoskeletal:  Negative for falls and myalgias.  Skin:  Negative for rash.  Neurological:  Positive for dizziness and weakness. Negative for tingling, tremors, sensory change, speech change, focal weakness and loss of consciousness.  Endo/Heme/Allergies:  Does not bruise/bleed easily.  Psychiatric/Behavioral:  Negative for substance abuse. The patient is not nervous/anxious.   All other systems reviewed and are negative.   EKGs/Labs/Other Studies Reviewed:    Studies reviewed were summarized above. The additional studies were reviewed today:  TEE 06/13/2021:  1. Left ventricular ejection fraction, by estimation, is 55 to 60%. The  left ventricle has normal function. The left ventricle has no regional  wall motion abnormalities.   2. Right ventricular systolic function is normal. The right ventricular  size is normal.   3. The mitral valve is degenerative. Moderate mitral valve regurgitation.  Moderate mitral stenosis. Moderate mitral annular calcification. Estimated  MVA by P1/2t : 1.61 cm sq, Mean gradient 9 mm Hg,   4. Agitated saline contrast bubble study was negative, with no evidence  of any interatrial shunt.   5. Left atrial size was moderately dilated. No left atrial/left atrial  appendage thrombus was detected.   6. There is Moderate (Grade III) atheroma plaque involving the ascending,  transverse and descending aorta.   Conclusion(s)/Recommendation(s): Normal biventricular function without  evidence of hemodynamically significant valvular heart disease. __________  [facility] 05/17/2021: Conclusions: Severe single-vessel coronary artery disease with 80-90% proximal/mid LAD stenosis involving large D1 branch.  D1 also contains an 80% stenosis  in its proximal segment.  No significant disease seen in ramus intermedius, LCx, and RCA. Mildly elevated right heart and left ventricular pressures (mean RA 11 mmHg, LVEDP 20 mmHg). Moderately-severely elevated pulmonary artery pressure (mean PA 42 mmHg) with elevated pulmonary vascular resistance (PVR 4.7 WU). Severe mitral valve stenosis, which may be accentuated by at least moderate mitral regurgitation based on PCWP tracing) mean gradient 13-17 mmHg, MVA 0.5-0.6 cm).   Recommendations: Consider  TEE for further evaluation of mitral valve disease. Case discussed with Dr. Rockey Situ.  We have agreed to refer the patient to cardiac surgery for further evaluation for potential mitral valve intervention and CABG. Add furosemide 40 mg p.o. daily.  Basic metabolic panel should be checked when the patient follows up in the office in 2 weeks. Aggressive secondary prevention of coronary artery disease. Restart apixaban 2.5 mg twice daily tomorrow morning if no evidence of bleeding/vascular injury.  Given greater than moderate mitral stenosis, transition to warfarin should be considered in the future in the setting of valvular atrial fibrillation. __________   2D echo 12/13/2020: 1. Left ventricular ejection fraction, by estimation, is 55 to 60%. Left  ventricular ejection fraction by 3D volume is 62 %. The left ventricle has  normal function. The left ventricle has no regional wall motion  abnormalities. Left ventricular diastolic   parameters are consistent with Grade II diastolic dysfunction  (pseudonormalization). The average left ventricular global longitudinal  strain is -14.4 %. The global longitudinal strain is abnormal.   2. Right ventricular systolic function is low normal. The right  ventricular size is normal. Mildly increased right ventricular wall  thickness.   3. Left atrial size was moderately dilated.   4. MVA per PHT is 1.7cm2, mean gradient 23mmHg at HR 70bpm. The mitral  valve is  degenerative. No evidence of mitral valve regurgitation. Moderate  to severe mitral stenosis.   5. The aortic valve was not well visualized. Aortic valve regurgitation  is mild. Mild aortic valve sclerosis is present, with no evidence of  aortic valve stenosis.   6. The inferior vena cava is dilated in size with >50% respiratory  variability, suggesting right atrial pressure of 8 mmHg.   Comparison(s): EF 55-60%, mild-moderate pericardial effusion, moderate MR  leaflet thickening, moderate MV stenosis peak gradient 31.5 mmHg, mean  gradient 10.0 mmHG, moderate MR. No evidence of aortic insufficiency.  __________   [facility]wyn Reach patch 10/2020 (PCP): Predominant rhythm of sinus with an average heart rate of 65 bpm (range 54 to 101 bpm), first-degree AV block was noted, occasional PACs representing a 1% burden, and rare PVCs. __________   2D echo 07/2020: 1. Left ventricular ejection fraction, by estimation, is 55 to 60%. The  left ventricle has normal function. The left ventricle has no regional  wall motion abnormalities. Left ventricular diastolic parameters are  indeterminate.   2. Right ventricular systolic function is normal. The right ventricular  size is normal. Tricuspid regurgitation signal is inadequate for assessing  PA pressure.   3. Left atrial size was mildly dilated.   4. Mild to Moderate circumferential pericardial effusion.   5. The mitral valve leaflets are thickened. Mild to moderate mitral valve  regurgitation. Moderate mitral stenosis. The mean mitral valve gradient is  10.0 mmHg.   6. The inferior vena cava is dilated in size with <50% respiratory  variability, suggesting right atrial pressure of 15 mmHg.  __________   Outpatient cardiac monitoring 05/2020: Normal sinus rhythm avg HR of 64 bpm.    Second Degree AV Block-Mobitz I (Wenckebach) was present (frequent 2-second pauses)   Isolated SVEs were rare (<1.0%), and no SVE Couplets or SVE Triplets were present.  Isolated VEs were rare (<1.0%), and no VE Couplets or VE Triplets were present.   No patient triggered events recorded. __________   2D echo 08/2015: - Procedure narrative: Transthoracic echocardiography. Image    quality was poor. The study was technically difficult, as a  result of poor acoustic windows and poor sound wave transmission.  - Left ventricle: The cavity size was normal. Systolic function was    normal. The estimated ejection fraction was in the range of 60%    to 65%. Wall motion was normal; there were no regional wall    motion abnormalities. The study is not technically sufficient to    allow evaluation of LV diastolic function.  - Mitral valve: There was mild regurgitation.  - Left atrium: The atrium was mildly dilated.  - Right ventricle: Systolic function was normal.  - Pulmonary arteries: Systolic pressure was within the normal    range.   Impressions:   - Rhythm is atrial fibrillation.   EKG:  EKG is ordered today.  The EKG ordered today demonstrates A. fib, 73 bpm, no acute ST-T changes, baseline artifact  Recent Labs: 07/03/2020: TSH 3.590 05/09/2021: Hemoglobin 12.7; Platelets 225 05/30/2021: ALT 43; BUN 20; Creatinine, Ser 0.90; Potassium 3.7; Sodium 141  Recent Lipid Panel    Component Value Date/Time   CHOL 119 09/17/2020 1051   TRIG 58 09/17/2020 1051   HDL 66 09/17/2020 1051   CHOLHDL 1.8 09/17/2020 1051   LDLCALC 40 09/17/2020 1051    PHYSICAL EXAM:    VS:  BP (!) 120/50 (BP Location: Left Arm, Patient Position: Sitting, Cuff Size: Normal)   Pulse 73   Ht 5\' 5"  (1.651 m)   Wt 130 lb (59 kg)   SpO2 96%   BMI 21.63 kg/m   BMI: Body mass index is 21.63 kg/m.  Physical Exam Vitals reviewed.  Constitutional:      Appearance: She is well-developed.  HENT:     Head: Normocephalic and atraumatic.  Eyes:     General:        Right eye: No discharge.        Left eye: No discharge.  Neck:     Vascular: No JVD.  Cardiovascular:      Rate and Rhythm: Normal rate. Rhythm irregularly irregular.     Pulses:          Posterior tibial pulses are 2+ on the right side and 2+ on the left side.     Heart sounds: Normal heart sounds, S1 normal and S2 normal. Heart sounds not distant. No midsystolic click and no opening snap. No murmur heard.   No friction rub.  Pulmonary:     Effort: Pulmonary effort is normal. No respiratory distress.     Breath sounds: Normal breath sounds. No decreased breath sounds, wheezing or rales.  Chest:     Chest wall: No tenderness.  Abdominal:     General: There is no distension.     Palpations: Abdomen is soft.     Tenderness: There is no abdominal tenderness.  Musculoskeletal:     Cervical back: Normal range of motion.     Right lower leg: No edema.     Left lower leg: No edema.  Skin:    General: Skin is warm and dry.     Nails: There is no clubbing.  Neurological:     Mental Status: She is alert and oriented to person, place, and time.  Psychiatric:        Speech: Speech normal.        Behavior: Behavior normal.        Thought Content: Thought content normal.        Judgment: Judgment normal.    Wt Readings from Last 3 Encounters:  06/28/21 130  lb (59 kg)  06/20/21 129 lb (58.5 kg)  06/18/21 129 lb (58.5 kg)     ASSESSMENT & PLAN:   CAD involving the native coronary arteries with stable dyspnea: Symptoms of dyspnea are overall improving with diuresis.  Recent LHC showed severe single-vessel CAD as outlined above.  She was evaluated by cardiothoracic surgery earlier this month and not felt to be a surgical candidate.  Given improvement in symptoms we will defer repeat cath for high risk PCI of the LAD, given this would likely lead to jailing of a large first diagonal.  Should she develop symptoms, would recommend escalation of antianginal therapy with addition of Imdur.  If, despite optimization of antianginal therapy, she continued to have anginal symptoms would consider pursuing high  risk PCI, this would likely be best accomplished at [facility] in [place].  Continue warfarin in place of aspirin to minimize bleeding risk.  Aggressive risk factor modification.  Moderate mitral valve stenosis with moderate mitral regurgitation: TEE showed her mitral valve stenosis was moderate.  She was evaluated by CVTS earlier this month with recommendation for conservative medical therapy as her degree of mitral valve disease was not felt to be severe, and she was not felt to be a surgical candidate as outlined in their note.  Continue medical therapy.  PAF: She is noted to be back in A. fib today and is largely asymptomatic.  We will reload her with amiodarone 4 mg twice daily for 1 week followed by 20 mg twice daily for another week followed by 10 mg daily there after.  I will see her back in clinic at that time, if she remains in A. fib we will pursue DCCV, as long as her INR has been therapeutic for a minimum of 4 weeks.  CHA2DS2-VASc at least 4.  No symptoms concerning for bleeding.  Pulmonary hypertension: Dyspnea continues to improve with Lasix.  Carotid artery disease: Stable on carotid artery ultrasound earlier this month.  Followed by vascular surgery.  She remains on warfarin in place of aspirin to minimize bleeding risk along with Zetia and Crestor.  HTN: Blood pressure is well controlled in the office today.  She remains on Cardizem CD and Lasix.  HLD: LDL 40 in 08/2020.  Continue Zetia and Crestor.  Chronic dizziness: Extensive cardiac, ENT, and neurologic work-up has been unrevealing.  Dizziness has improved.  No indication for further cardiac testing at this time.  Disposition: F/u with Dr. Rockey Situ or an APP in 2-3 weeks.   Medication Adjustments/Labs and Tests Ordered: Current medicines are reviewed at length with the patient today.  Concerns regarding medicines are outlined above. Medication changes, Labs and Tests ordered today are summarized above and listed in the  Patient Instructions accessible in Encounters.   Signed, Christell Faith, PA-C 06/28/2021 1:39 PM     [address] [place], [place] [address] [phone]

## 2021-06-26 ENCOUNTER — Ambulatory Visit (INDEPENDENT_AMBULATORY_CARE_PROVIDER_SITE_OTHER): Payer: Medicare Other

## 2021-06-26 ENCOUNTER — Other Ambulatory Visit: Payer: Self-pay

## 2021-06-26 ENCOUNTER — Telehealth: Payer: Self-pay

## 2021-06-26 DIAGNOSIS — I480 Paroxysmal atrial fibrillation: Secondary | ICD-10-CM

## 2021-06-26 DIAGNOSIS — I050 Rheumatic mitral stenosis: Secondary | ICD-10-CM

## 2021-06-26 DIAGNOSIS — Z5181 Encounter for therapeutic drug level monitoring: Secondary | ICD-10-CM

## 2021-06-26 LAB — POCT INR: INR: 2.7 (ref 2.0–3.0)

## 2021-06-26 NOTE — Telephone Encounter (Signed)
Was able to reach out to Molly Hodges as requested by Dr. Rockey Situ, Dr. Rockey Situ sent staff message stating  Coronary disease, calcified mitral valve with stenosis  Can we call her, would recommend she call our office for any symptoms concerning for angina  Potentially could attempt high risk stenting  TG   Pt reports she is doing fine right now, no CP or other complaints. Plans to keep appt this Friday 11/11 with Molly Faith PA-C, will discuss potential cath in the future if CP/Angina persist or gets worse sicne Dr. Cyndia Bent feels CABG/heart sx would be too complex and too many risk. Pt verbalized understanding.  Anytime experiencing CP/Angina even after appt with Rayn, advised pt to call in so we can address the concern and plan for potential intervention. Molly Hodges verbalized understanding and is thankful for calling . Will be at appt this Friday.

## 2021-06-26 NOTE — Patient Instructions (Signed)
-   continue dosage of 1/2 tablet warfarin every day - Recheck INR next week

## 2021-06-28 ENCOUNTER — Ambulatory Visit: Payer: Medicare Other | Admitting: Physician Assistant

## 2021-06-28 ENCOUNTER — Other Ambulatory Visit: Payer: Self-pay

## 2021-06-28 ENCOUNTER — Telehealth: Payer: Self-pay | Admitting: Cardiovascular Disease

## 2021-06-28 ENCOUNTER — Encounter: Payer: Self-pay | Admitting: Physician Assistant

## 2021-06-28 VITALS — BP 120/50 | HR 73 | Ht 65.0 in | Wt 130.0 lb

## 2021-06-28 DIAGNOSIS — I10 Essential (primary) hypertension: Secondary | ICD-10-CM | POA: Diagnosis not present

## 2021-06-28 DIAGNOSIS — E785 Hyperlipidemia, unspecified: Secondary | ICD-10-CM | POA: Diagnosis not present

## 2021-06-28 DIAGNOSIS — I480 Paroxysmal atrial fibrillation: Secondary | ICD-10-CM | POA: Diagnosis not present

## 2021-06-28 DIAGNOSIS — I25118 Atherosclerotic heart disease of native coronary artery with other forms of angina pectoris: Secondary | ICD-10-CM | POA: Diagnosis not present

## 2021-06-28 DIAGNOSIS — I6523 Occlusion and stenosis of bilateral carotid arteries: Secondary | ICD-10-CM

## 2021-06-28 DIAGNOSIS — R42 Dizziness and giddiness: Secondary | ICD-10-CM | POA: Diagnosis not present

## 2021-06-28 DIAGNOSIS — I050 Rheumatic mitral stenosis: Secondary | ICD-10-CM

## 2021-06-28 DIAGNOSIS — I272 Pulmonary hypertension, unspecified: Secondary | ICD-10-CM

## 2021-06-28 MED ORDER — AMIODARONE HCL 200 MG PO TABS: ORAL_TABLET | ORAL | 3 refills | Status: DC

## 2021-06-28 NOTE — Telephone Encounter (Signed)
Patient calling States she is unsure of instructions given for medication, would like to clarify Please call

## 2021-06-28 NOTE — Telephone Encounter (Signed)
Reviewed recommendations with patient in detail and she verbalized understanding. She just wanted to make sure she followed instructions and just wanted to be sure. She verbalized understanding of instructions and had no further questions at this time.

## 2021-06-28 NOTE — Patient Instructions (Addendum)
Medication Instructions:  Your physician has recommended you make the following change in your medication:   START Amiodarone 200 mg  TAKE 2 tablets (400 mg) twice a day for one week THEN 1 tablet (200 mg) twice a day for one week THEN 1 tablet (200 mg) once a day  *If you need a refill on your cardiac medications before your next appointment, please call your pharmacy*   Lab Work: None  If you have labs (blood work) drawn today and your tests are completely normal, you will receive your results only by: [place] (if you have MyChart) OR A paper copy in the mail If you have any lab test that is abnormal or we need to change your treatment, we will call you to review the results.   Testing/Procedures: None   Follow-Up: At [facility], you and your health needs are our priority.  As part of our continuing mission to provide you with exceptional heart care, we have created designated Provider Care Teams.  These Care Teams include your primary Cardiologist (physician) and Advanced Practice Providers (APPs -  Physician Assistants and Nurse Practitioners) who all work together to provide you with the care you need, when you need it.   Your next appointment:   2-3 week(s)  The format for your next appointment:   In Person  Provider:   Ida Rogue, MD or Christell Faith, PA-C

## 2021-07-10 ENCOUNTER — Ambulatory Visit (INDEPENDENT_AMBULATORY_CARE_PROVIDER_SITE_OTHER): Payer: Medicare Other

## 2021-07-10 ENCOUNTER — Other Ambulatory Visit: Payer: Self-pay

## 2021-07-10 DIAGNOSIS — I480 Paroxysmal atrial fibrillation: Secondary | ICD-10-CM

## 2021-07-10 DIAGNOSIS — I050 Rheumatic mitral stenosis: Secondary | ICD-10-CM

## 2021-07-10 DIAGNOSIS — Z5181 Encounter for therapeutic drug level monitoring: Secondary | ICD-10-CM | POA: Diagnosis not present

## 2021-07-10 LAB — POCT INR: INR: 3 (ref 2.0–3.0)

## 2021-07-10 MED ORDER — WARFARIN SODIUM 2.5 MG PO TABS: 2.5000 mg | ORAL_TABLET | Freq: Every day | ORAL | 1 refills | Status: DC

## 2021-07-10 NOTE — Patient Instructions (Addendum)
-   have some greens tomorrow,  - continue dosage of 2.5 mg warfarin every day - I'm sending you in a new rx of 2.5 mg tablets (they should be green), so you will take 1 whole tablet every day after you finish those half tablets - Recheck INR in 2 weeks

## 2021-07-21 DIAGNOSIS — R0902 Hypoxemia: Secondary | ICD-10-CM | POA: Diagnosis not present

## 2021-07-22 ENCOUNTER — Other Ambulatory Visit: Payer: Self-pay

## 2021-07-22 ENCOUNTER — Encounter: Payer: Self-pay | Admitting: Cardiovascular Disease

## 2021-07-22 ENCOUNTER — Ambulatory Visit: Payer: Medicare Other | Admitting: Cardiovascular Disease

## 2021-07-22 VITALS — BP 150/68 | HR 63 | Ht 65.0 in | Wt 131.4 lb

## 2021-07-22 DIAGNOSIS — I480 Paroxysmal atrial fibrillation: Secondary | ICD-10-CM | POA: Diagnosis not present

## 2021-07-22 DIAGNOSIS — I272 Pulmonary hypertension, unspecified: Secondary | ICD-10-CM

## 2021-07-22 DIAGNOSIS — I10 Essential (primary) hypertension: Secondary | ICD-10-CM

## 2021-07-22 DIAGNOSIS — R0609 Other forms of dyspnea: Secondary | ICD-10-CM | POA: Diagnosis not present

## 2021-07-22 DIAGNOSIS — I050 Rheumatic mitral stenosis: Secondary | ICD-10-CM | POA: Diagnosis not present

## 2021-07-22 DIAGNOSIS — E785 Hyperlipidemia, unspecified: Secondary | ICD-10-CM | POA: Diagnosis not present

## 2021-07-22 DIAGNOSIS — I25118 Atherosclerotic heart disease of native coronary artery with other forms of angina pectoris: Secondary | ICD-10-CM

## 2021-07-22 DIAGNOSIS — I6523 Occlusion and stenosis of bilateral carotid arteries: Secondary | ICD-10-CM

## 2021-07-22 MED ORDER — ISOSORBIDE MONONITRATE ER 30 MG PO TB24: 30.0000 mg | ORAL_TABLET | Freq: Every day | ORAL | 3 refills | Status: DC

## 2021-07-22 NOTE — Patient Instructions (Addendum)
We will schedule cardiac cath with Dr. Saunders [name] at [facility] 08/02/21  Medication Instructions:  Please start isosorbide 30 mg daily Stop amiodarone  If you need a refill on your cardiac medications before your next appointment, please call your pharmacy.    Lab work: [employer] [facility]   Testing/Procedures: Heart cath in [place]: At [facility], you and your health needs are our priority.  As part of our continuing mission to provide you with exceptional heart care, we have created designated Provider Care Teams.  These Care Teams include your primary Cardiologist (physician) and Advanced Practice Providers (APPs -  Physician Assistants and Nurse Practitioners) who all work together to provide you with the care you need, when you need it.  You will need a follow up appointment in 1 month  Providers on your designated Care Team:   Murray Hodgkins, NP Christell Faith, PA-C Cadence Kathlen Mody, [place]  COVID-19 Vaccine Information can be found at: ShippingScam.co.uk For questions related to vaccine distribution or appointments, please email [email or web address] or call [phone].   [facility] [address] [place], [place] [address] [phone] Please arrive at the [facility] are scheduled for a Cardiac Cath on: we will try to schedule on 12/16 We will call you with time  Please expect a call from our [place] to pre-register you Do not eat/drink anything after midnight Someone will need to drive you home It is recommended someone be with you for the first 24 hours after your procedure Wear clothes that are easy to get on/off and wear slip on shoes if possible  Please bring a current list of all medications with you  _X__ You may take all of your medications the morning of your procedure with enough water to swallow safely Be sure to  take aspirin 81 mg the morning of the hearth cath and daily week of procedure   _X__ Medication instructions  Hold warfarin 5 days prior to cardiac cath When off warfarin Monday 12/12 Starting Monday 12/12 take plavix 300mg    Starting Tuesday take 75 mg daily Start asa 81 mg daily on Monday  _X__ Do Not take Lasix day of procedure   For your safety and being able to monitor your condition, we request that they you do not wear nail polish, gel polish, artificial nails, or any other type of covering on natural nails including finger and toenails. If you have artificial nails, gel coating, etc.that requires to be removed by a nail saloon please have this removed prior to procedure. Your procedure may need to be canceled/ delayed if the performing provider/anesthesia team feels like the patient is unable to be adequately monitored.  Day of your procedure:  Please arrive at the [facility] main. Free valet service is available.  Please check-in at the registration desk to receive your armband. After receiving your armband someone will escort you to the cardiac cath/special procedures waiting area.  The usual length of stay after your procedure is about 2 to 3 hours.  This can vary.  If you have any questions, please call our office at [phone], or you may call the cardiac cath lab at [facility] directly at [phone]  Your physician has requested that you have a cardiac catheterization. Cardiac catheterization is used to diagnose and/or treat various heart conditions. Doctors may recommend this procedure for a number of different reasons. The most common reason is to evaluate chest  pain. Chest pain can be a symptom of coronary artery disease (CAD), and cardiac catheterization can show whether plaque is narrowing or blocking your heart's arteries. This procedure is also used to evaluate the valves, as well as measure the blood flow and oxygen levels in different parts of your heart. For further  information please visit HugeFiesta.tn. Please follow instruction sheet, as given.

## 2021-07-22 NOTE — Progress Notes (Addendum)
Cardiology Office Note  Date:  07/22/2021   ID:  Molly Hodges 1931/11/26, MRN [id number]  PCP:  Birdie Sons, MD   Chief Complaint  Patient presents with   2-3 week follow up    Patient c/o shortness of breath for the past couple of days. Medications reviewed by the patient verbally.     HPI:  Molly Hodges is a 87 y.o. female with  Paroxysmal Afib,  (CHADS2VASc of 4 - HTN, age x 2, female)  COPD, (never smoked) asthma, prior episodes of PNA,   hospital admission from 1/30 to 09/21/15,  HTN  Mitral valve stenosis, moderate December 2021 Pericardial effusion mild to moderate December 2021 PAD, carotid stenosis, high-grade on the right, stent placed Chronic dizziness 05/2020 started, last couple of weeks at a time, seen by ENT/neurology who presents  Follow-up for her atrial fibrillation,Previous cardioversion, carotid stenosis  Last seen by myself in clinic 5/22  Cardiac cath 05/17/21 Severe proximal LAD disease Was referred to cardiothoracic surgeon Not surgical candidate for mitral valve replacement and CABG  In follow-up today presents with family Having SOB episodes, heaviness in chest Unable to do anything, has stopped going out, no shopping, no activity secondary to symptoms  Wonders if there is anything to be done Denies any PND orthopnea  EKG personally reviewed by myself on todays visit Shows atrial fibrillation , no significant ST or T-wave changes  Other past medical history reviewed Echocardiogram July 25, 2020  EF 55 to 60%, mildly dilated left atrium, mild to moderate circumferential pericardial effusion, Mild to moderate mitral valve regurgitation moderate mitral valve stenosis gradient 10 mmHg Dilated IVC  Seen by Dr. Lucky Cowboy, episodes of dizziness High-grade right carotid artery stenosis estimated 80 to 99%  found to be in new onset Afib with RVR on 09/17/15 upon her arrival to [facility], pneumonia started on Eliquis given her stroke risk  (CHADS2VASc of 4 - HTN, age x 2, female) and rate controlled.  Beta blockers were avoided given her asthma.  Started on thousand channel blocker     PMH:   has a past medical history of Asthma, Carotid artery disease ([place]), Community acquired pneumonia, Essential hypertension, Glaucoma, Hyperlipidemia, Mitral regurgitation, and PAF (paroxysmal atrial fibrillation) ([place]) (09/17/2015).  PSH:    Past Surgical History:  Procedure Laterality Date   ABDOMINAL HYSTERECTOMY  1987   due to heavy bleeding   APPENDECTOMY     CARDIAC CATHETERIZATION     CAROTID PTA/STENT INTERVENTION Right 08/15/2020   Procedure: CAROTID PTA/STENT INTERVENTION;  Surgeon: Algernon Huxley, MD;  Location: [place] [facility];  Service: Cardiovascular;  Laterality: Right;   CATARACT EXTRACTION     ELECTROPHYSIOLOGIC STUDY N/A 11/15/2015   Procedure: CARDIOVERSION;  Surgeon: Minna Merritts, MD;  Location: [facility] ORS;  Service: Cardiovascular;  Laterality: N/A;   RIGHT/LEFT HEART CATH AND CORONARY ANGIOGRAPHY N/A 05/17/2021   Procedure: RIGHT/LEFT HEART CATH AND CORONARY ANGIOGRAPHY;  Surgeon: Nelva Bush, MD;  Location: [place] [facility];  Service: Cardiovascular;  Laterality: N/A;   TEE WITHOUT CARDIOVERSION N/A 06/13/2021   Procedure: TRANSESOPHAGEAL ECHOCARDIOGRAM (TEE);  Surgeon: Minna Merritts, MD;  Location: [facility] ORS;  Service: Cardiovascular;  Laterality: N/A;    Current Outpatient Medications  Medication Sig Dispense Refill   albuterol (VENTOLIN HFA) 108 (90 Base) MCG/ACT inhaler Inhale 2 puffs into the lungs every 6 (six) hours as needed for wheezing or shortness of breath. 8 g 2   amiodarone (PACERONE) 200 MG tablet Take  2 tablets (400 mg total) by mouth 2 (two) times daily for 7 days, THEN 1 tablet (200 mg total) 2 (two) times daily for 7 days, THEN 1 tablet (200 mg total) daily for 16 days. 90 tablet 3   Calcium Carbonate-Vitamin D 600-400 MG-UNIT tablet Take 1 tablet by mouth daily.      cholecalciferol (VITAMIN D) 1000 UNITS tablet Take 1,000 Units by mouth daily.     diltiazem (CARDIZEM CD) 180 MG 24 hr capsule TAKE 1 CAPSULE EVERY DAY 30 capsule 11   dorzolamide (TRUSOPT) 2 % ophthalmic solution Place 1 drop into both eyes 2 (two) times daily.      ezetimibe (ZETIA) 10 MG tablet TAKE 1 TABLET BY MOUTH DAILY. 90 tablet 3   furosemide (LASIX) 40 MG tablet Take 1 tablet (40 mg total) by mouth daily. 30 tablet 5   ipratropium-albuterol (DUONEB) 0.5-2.5 (3) MG/3ML SOLN Take 3 mLs by nebulization every 4 (four) hours as needed. 360 mL 1   latanoprost (XALATAN) 0.005 % ophthalmic solution Place 1 drop into both eyes at bedtime.      levalbuterol (XOPENEX HFA) 45 MCG/ACT inhaler Inhale 2 puffs into the lungs every 4 (four) hours as needed for wheezing or shortness of breath.     magnesium oxide (MAG-OX) 400 MG tablet Take 400 mg by mouth daily.     Multiple Vitamins-Minerals (PRESERVISION AREDS 2+MULTI VIT) CAPS Take 1 capsule by mouth in the morning and at bedtime.     nitroGLYCERIN (NITROSTAT) 0.4 MG SL tablet Place 1 tablet (0.4 mg total) under the tongue every 5 (five) minutes as needed (for chest pain or shortness of breath). 100 tablet 3   Omega-3 Fatty Acids (FISH OIL BURP-LESS) 1000 MG CAPS Take 1,000 mg by mouth daily.      potassium chloride (KLOR-CON) 10 MEQ tablet TAKE 1 TABLET BY MOUTH DAILY 90 tablet 0   pregabalin (LYRICA) 50 MG capsule TAKE 1 CAPSULE BY MOUTH 2 TIMES DAILY 60 capsule 5   rosuvastatin (CRESTOR) 20 MG tablet TAKE 1 TABLET BY MOUTH DAILY. 90 tablet 3   vitamin C (ASCORBIC ACID) 500 MG tablet Take 500 mg by mouth daily.     vitamin E 100 UNIT capsule Take 200 Units by mouth daily.     warfarin (COUMADIN) 2.5 MG tablet Take 1 tablet (2.5 mg total) by mouth daily. Take as directed by the anti-coag clinic 30 tablet 1   No current facility-administered medications for this visit.     Allergies:   Shellfish allergy, Azithromycin, Tamiflu [oseltamivir  phosphate], and Albuterol   Social History:  The patient  reports that she has never smoked. She has never used smokeless tobacco. She reports that she does not drink alcohol and does not use drugs.   Family History:   family history includes CAD in her father and mother; Cancer (age of onset: 6) in her son.    Review of Systems: Review of Systems  Constitutional: Negative.   Respiratory: Negative.    Cardiovascular: Negative.   Gastrointestinal: Negative.   Musculoskeletal: Negative.   Neurological: Negative.   Psychiatric/Behavioral: Negative.    All other systems reviewed and are negative.  PHYSICAL EXAM: VS:  BP (!) 150/68 (BP Location: Left Arm, Patient Position: Sitting, Cuff Size: Normal)   Pulse 63   Ht 5\' 5"  (1.651 m)   Wt 131 lb 6 oz (59.6 kg)   SpO2 98%   BMI 21.86 kg/m  , BMI Body mass index is 21.86 kg/m.  Constitutional:  oriented to person, place, and time. No distress.  HENT:  Head: Grossly normal Eyes:  no discharge. No scleral icterus.  Neck: No JVD, no carotid bruits  Cardiovascular: Regular rate and rhythm, no murmurs appreciated Pulmonary/Chest: Clear to auscultation bilaterally, no wheezes or rails Abdominal: Soft.  no distension.  no tenderness.  Musculoskeletal: Normal range of motion Neurological:  normal muscle tone. Coordination normal. No atrophy Skin: Skin warm and dry Psychiatric: normal affect, pleasant  Recent Labs: 05/09/2021: Hemoglobin 12.7; Platelets 225 05/30/2021: ALT 43; BUN 20; Creatinine, Ser 0.90; Potassium 3.7; Sodium 141    Lipid Panel Lab Results  Component Value Date   CHOL 119 09/17/2020   HDL 66 09/17/2020   LDLCALC 40 09/17/2020   TRIG 58 09/17/2020      Wt Readings from Last 3 Encounters:  07/22/21 131 lb 6 oz (59.6 kg)  06/28/21 130 lb (59 kg)  06/20/21 129 lb (58.5 kg)     ASSESSMENT AND PLAN:  Paroxysmal atrial fibrillation (HCC) -  Atrial fibrillation noted on EKG November 2022, she is on warfarin,  was started on amiodarone with follow-up today Remains in atrial fibrillation 07/22/21 Given need for cardiac catheterization December 16, no plan to restore normal sinus rhythm, we will revisit this in January 2023 after she has been back on warfarin for at least 4 weeks at therapeutic range Rate relatively well controlled on diltiazem -We will recommend she stop the amiodarone as they will be a period of time off the warfarin in the near future -We will add 30 mg isosorbide mononitrate daily  PAD/carotid stenosis Prior stenting,  Cholesterol at goal  Essential hypertension We will add Imdur 30 mg daily  Chronic dizziness Previously seen by vestibular PT  Mitral valve stenosis Images pulled up and reviewed with her, moderate in nature, She has declined TEE at this time, she is not a surgical candidate given severity of disease is not severe, no pulmonary hypertension -We will continue to watch with periodic echocardiogram No change in mitral valve from December 21 to April 2022 Gradient 10 to 11 mmHg placing it in moderate range  Centrilobular emphysema (HCC) Stable  Hyperlipidemia Cholesterol is at goal on the current lipid regimen. No changes to the medications were made.    Total encounter time more than 35 minutes  Greater than 50% was spent in counseling and coordination of care with the patient     No orders of the defined types were placed in this encounter.    Signed, Esmond Plants, M.D., Ph.D. 07/22/2021  [place], [place]

## 2021-07-22 NOTE — H&P (View-Only) (Signed)
Cardiology Office Note  Date:  07/22/2021   ID:  Molly Hodges, Molly Hodges Jun 29, 1932, MRN [id number]  PCP:  Birdie Sons, MD   Chief Complaint  Patient presents with   2-3 week follow up    Patient c/o shortness of breath for the past couple of days. Medications reviewed by the patient verbally.     HPI:  Molly Hodges is a 87 y.o. female with  Paroxysmal Afib,  (CHADS2VASc of 4 - HTN, age x 2, female)  COPD, (never smoked) asthma, prior episodes of PNA,   hospital admission from 1/30 to 09/21/15,  HTN  Mitral valve stenosis, moderate December 2021 Pericardial effusion mild to moderate December 2021 PAD, carotid stenosis, high-grade on the right, stent placed Chronic dizziness 05/2020 started, last couple of weeks at a time, seen by ENT/neurology who presents  Follow-up for her atrial fibrillation,Previous cardioversion, carotid stenosis  Last seen by myself in clinic 5/22  Cardiac cath 05/17/21 Severe proximal LAD disease Was referred to cardiothoracic surgeon Not surgical candidate for mitral valve replacement and CABG  In follow-up today presents with family Having SOB episodes, heaviness in chest Unable to do anything, has stopped going out, no shopping, no activity secondary to symptoms  Wonders if there is anything to be done Denies any PND orthopnea  EKG personally reviewed by myself on todays visit Shows normal sinus rhythm with rate 69 bpm no significant ST or T-wave changes  Other past medical history reviewed Echocardiogram July 25, 2020  EF 55 to 60%, mildly dilated left atrium, mild to moderate circumferential pericardial effusion, Mild to moderate mitral valve regurgitation moderate mitral valve stenosis gradient 10 mmHg Dilated IVC  Seen by Dr. Lucky Cowboy, episodes of dizziness High-grade right carotid artery stenosis estimated 80 to 99%  found to be in new onset Afib with RVR on 09/17/15 upon her arrival to [facility], pneumonia started on Eliquis given  her stroke risk (CHADS2VASc of 4 - HTN, age x 2, female) and rate controlled.  Beta blockers were avoided given her asthma.  Started on thousand channel blocker     PMH:   has a past medical history of Asthma, Carotid artery disease ([place]), Community acquired pneumonia, Essential hypertension, Glaucoma, Hyperlipidemia, Mitral regurgitation, and PAF (paroxysmal atrial fibrillation) ([place]) (09/17/2015).  PSH:    Past Surgical History:  Procedure Laterality Date   ABDOMINAL HYSTERECTOMY  1987   due to heavy bleeding   APPENDECTOMY     CARDIAC CATHETERIZATION     CAROTID PTA/STENT INTERVENTION Right 08/15/2020   Procedure: CAROTID PTA/STENT INTERVENTION;  Surgeon: Algernon Huxley, MD;  Location: [place] [facility];  Service: Cardiovascular;  Laterality: Right;   CATARACT EXTRACTION     ELECTROPHYSIOLOGIC STUDY N/A 11/15/2015   Procedure: CARDIOVERSION;  Surgeon: Minna Merritts, MD;  Location: [facility];  Service: Cardiovascular;  Laterality: N/A;   RIGHT/LEFT HEART CATH AND CORONARY ANGIOGRAPHY N/A 05/17/2021   Procedure: RIGHT/LEFT HEART CATH AND CORONARY ANGIOGRAPHY;  Surgeon: Nelva Bush, MD;  Location: [place] [facility];  Service: Cardiovascular;  Laterality: N/A;   TEE WITHOUT CARDIOVERSION N/A 06/13/2021   Procedure: TRANSESOPHAGEAL ECHOCARDIOGRAM (TEE);  Surgeon: Minna Merritts, MD;  Location: [facility];  Service: Cardiovascular;  Laterality: N/A;    Current Outpatient Medications  Medication Sig Dispense Refill   albuterol (VENTOLIN HFA) 108 (90 Base) MCG/ACT inhaler Inhale 2 puffs into the lungs every 6 (six) hours as needed for wheezing or shortness of breath. 8 g 2   amiodarone (PACERONE)  200 MG tablet Take 2 tablets (400 mg total) by mouth 2 (two) times daily for 7 days, THEN 1 tablet (200 mg total) 2 (two) times daily for 7 days, THEN 1 tablet (200 mg total) daily for 16 days. 90 tablet 3   Calcium Carbonate-Vitamin D 600-400 MG-UNIT tablet Take 1 tablet by mouth daily.      cholecalciferol (VITAMIN D) 1000 UNITS tablet Take 1,000 Units by mouth daily.     diltiazem (CARDIZEM CD) 180 MG 24 hr capsule TAKE 1 CAPSULE EVERY DAY 30 capsule 11   dorzolamide (TRUSOPT) 2 % ophthalmic solution Place 1 drop into both eyes 2 (two) times daily.      ezetimibe (ZETIA) 10 MG tablet TAKE 1 TABLET BY MOUTH DAILY. 90 tablet 3   furosemide (LASIX) 40 MG tablet Take 1 tablet (40 mg total) by mouth daily. 30 tablet 5   ipratropium-albuterol (DUONEB) 0.5-2.5 (3) MG/3ML SOLN Take 3 mLs by nebulization every 4 (four) hours as needed. 360 mL 1   latanoprost (XALATAN) 0.005 % ophthalmic solution Place 1 drop into both eyes at bedtime.      levalbuterol (XOPENEX HFA) 45 MCG/ACT inhaler Inhale 2 puffs into the lungs every 4 (four) hours as needed for wheezing or shortness of breath.     magnesium oxide (MAG-OX) 400 MG tablet Take 400 mg by mouth daily.     Multiple Vitamins-Minerals (PRESERVISION AREDS 2+MULTI VIT) CAPS Take 1 capsule by mouth in the morning and at bedtime.     nitroGLYCERIN (NITROSTAT) 0.4 MG SL tablet Place 1 tablet (0.4 mg total) under the tongue every 5 (five) minutes as needed (for chest pain or shortness of breath). 100 tablet 3   Omega-3 Fatty Acids (FISH OIL BURP-LESS) 1000 MG CAPS Take 1,000 mg by mouth daily.      potassium chloride (KLOR-CON) 10 MEQ tablet TAKE 1 TABLET BY MOUTH DAILY 90 tablet 0   pregabalin (LYRICA) 50 MG capsule TAKE 1 CAPSULE BY MOUTH 2 TIMES DAILY 60 capsule 5   rosuvastatin (CRESTOR) 20 MG tablet TAKE 1 TABLET BY MOUTH DAILY. 90 tablet 3   vitamin C (ASCORBIC ACID) 500 MG tablet Take 500 mg by mouth daily.     vitamin E 100 UNIT capsule Take 200 Units by mouth daily.     warfarin (COUMADIN) 2.5 MG tablet Take 1 tablet (2.5 mg total) by mouth daily. Take as directed by the anti-coag clinic 30 tablet 1   No current facility-administered medications for this visit.     Allergies:   Shellfish allergy, Azithromycin, Tamiflu [oseltamivir  phosphate], and Albuterol   Social History:  The patient  reports that she has never smoked. She has never used smokeless tobacco. She reports that she does not drink alcohol and does not use drugs.   Family History:   family history includes CAD in her father and mother; Cancer (age of onset: 82) in her son.    Review of Systems: Review of Systems  Constitutional: Negative.   Respiratory: Negative.    Cardiovascular: Negative.   Gastrointestinal: Negative.   Musculoskeletal: Negative.   Neurological: Negative.   Psychiatric/Behavioral: Negative.    All other systems reviewed and are negative.  PHYSICAL EXAM: VS:  BP (!) 150/68 (BP Location: Left Arm, Patient Position: Sitting, Cuff Size: Normal)    Pulse 63    Ht 5\' 5"  (1.651 m)    Wt 131 lb 6 oz (59.6 kg)    SpO2 98%    BMI 21.86 kg/m  ,  BMI Body mass index is 21.86 kg/m. Constitutional:  oriented to person, place, and time. No distress.  HENT:  Head: Grossly normal Eyes:  no discharge. No scleral icterus.  Neck: No JVD, no carotid bruits  Cardiovascular: Regular rate and rhythm, no murmurs appreciated Pulmonary/Chest: Clear to auscultation bilaterally, no wheezes or rails Abdominal: Soft.  no distension.  no tenderness.  Musculoskeletal: Normal range of motion Neurological:  normal muscle tone. Coordination normal. No atrophy Skin: Skin warm and dry Psychiatric: normal affect, pleasant  Recent Labs: 05/09/2021: Hemoglobin 12.7; Platelets 225 05/30/2021: ALT 43; BUN 20; Creatinine, Ser 0.90; Potassium 3.7; Sodium 141    Lipid Panel Lab Results  Component Value Date   CHOL 119 09/17/2020   HDL 66 09/17/2020   LDLCALC 40 09/17/2020   TRIG 58 09/17/2020      Wt Readings from Last 3 Encounters:  07/22/21 131 lb 6 oz (59.6 kg)  06/28/21 130 lb (59 kg)  06/20/21 129 lb (58.5 kg)     ASSESSMENT AND PLAN:  Paroxysmal atrial fibrillation (HCC) -  Atrial fibrillation noted on EKG November 2022, she is on warfarin,  was started on amiodarone with follow-up today Remains in atrial fibrillation Given need for cardiac catheterization December 16, no plan to restore normal sinus rhythm, we will revisit this in January 2023 after she has been back on warfarin for at least 4 weeks at therapeutic range Rate relatively well controlled on diltiazem -We will recommend she stop the amiodarone as they will be a period of time off the warfarin in the near future -We will add 30 mg isosorbide mononitrate daily  PAD/carotid stenosis Prior stenting,  Cholesterol at goal  Essential hypertension We will add Imdur 30 mg daily  Chronic dizziness Previously seen by vestibular PT  Mitral valve stenosis Images pulled up and reviewed with her, moderate in nature, She has declined TEE at this time, she is not a surgical candidate given severity of disease is not severe, no pulmonary hypertension -We will continue to watch with periodic echocardiogram No change in mitral valve from December 21 to April 2022 Gradient 10 to 11 mmHg placing it in moderate range  Centrilobular emphysema (HCC) Stable  Hyperlipidemia Cholesterol is at goal on the current lipid regimen. No changes to the medications were made.    Total encounter time more than 35 minutes  Greater than 50% was spent in counseling and coordination of care with the patient     No orders of the defined types were placed in this encounter.    Signed, Esmond Plants, M.D., Ph.D. 07/22/2021  [place], [place]

## 2021-07-23 ENCOUNTER — Telehealth: Payer: Self-pay

## 2021-07-23 DIAGNOSIS — Z01810 Encounter for preprocedural cardiovascular examination: Secondary | ICD-10-CM

## 2021-07-23 MED ORDER — CLOPIDOGREL BISULFATE 75 MG PO TABS: ORAL_TABLET | ORAL | 0 refills | Status: DC

## 2021-07-23 NOTE — Telephone Encounter (Signed)
Was able to reach out to Mrs. Fajardo to give her the date and time of her heart cath at [facility] with Dr. Saunders [name]. Friday 08/02/2021 at 09:00 am, need to arrive at 07:00 am  Reviewed medication instructions again with pt as discuss at yesterday's OV with Dr. Rockey Situ Hold warfarin 5 days prior to cardiac cath When off warfarin Monday 12/12 Starting Monday 12/12 take plavix 300 mg  Starting Tuesday take 75 mg daily Start asa 81 mg daily on Monday  Pt is to come in for labs (CBC & BMP) Thursday 12/8 at 11:30 am.   Also advised Dr. Rockey Situ wanted her to stop her amiodarone as she is coming off warfarin, will be instructed after cath to restart if needed.   Mrs. Pitera and her daughter [name] verbalized understanding, and very thankful for the return call for her heart cath instructions, advised to call back with any further concerns or questions. They will pick up IMDUR (order yesterday at [place]) and Plavix at [place] today.

## 2021-07-24 ENCOUNTER — Other Ambulatory Visit
Admission: RE | Admit: 2021-07-24 | Discharge: 2021-07-24 | Disposition: A | Discharge status: Home / Self Care | Payer: Medicare Other | Attending: Internal Medicine | Admitting: Internal Medicine

## 2021-07-24 ENCOUNTER — Other Ambulatory Visit: Payer: Self-pay

## 2021-07-24 ENCOUNTER — Telehealth: Payer: Self-pay

## 2021-07-24 ENCOUNTER — Ambulatory Visit (INDEPENDENT_AMBULATORY_CARE_PROVIDER_SITE_OTHER): Payer: Medicare Other

## 2021-07-24 DIAGNOSIS — I480 Paroxysmal atrial fibrillation: Secondary | ICD-10-CM | POA: Diagnosis not present

## 2021-07-24 DIAGNOSIS — I050 Rheumatic mitral stenosis: Secondary | ICD-10-CM | POA: Diagnosis not present

## 2021-07-24 DIAGNOSIS — Z5181 Encounter for therapeutic drug level monitoring: Secondary | ICD-10-CM | POA: Diagnosis not present

## 2021-07-24 LAB — BASIC METABOLIC PANEL
Anion gap: 10 (ref 5–15)
BUN: 15 mg/dL (ref 8–23)
CO2: 25 mmol/L (ref 22–32)
Calcium: 8.6 mg/dL — ABNORMAL LOW (ref 8.9–10.3)
Chloride: 101 mmol/L (ref 98–111)
Creatinine, Ser: 0.74 mg/dL (ref 0.44–1.00)
GFR, Estimated: >60 mL/min (ref >60)
Glucose, Bld: 99 mg/dL (ref 70–99)
Potassium: 3.7 mmol/L (ref 3.5–5.1)
Sodium: 136 mmol/L (ref 135–145)

## 2021-07-24 LAB — CBC WITH DIFFERENTIAL/PLATELET
Abs Immature Granulocytes: 0.04 10*3/uL (ref 0.00–0.07)
Basophils Absolute: 0.1 10*3/uL (ref 0.0–0.1)
Basophils Relative: 1 %
Eosinophils Absolute: 0.2 10*3/uL (ref 0.0–0.5)
Eosinophils Relative: 2 %
HCT: 36.2 % (ref 36.0–46.0)
Hemoglobin: 11.3 g/dL — ABNORMAL LOW (ref 12.0–15.0)
Immature Granulocytes: 0 %
Lymphocytes Relative: 11 %
Lymphs Abs: 1.2 10*3/uL (ref 0.7–4.0)
MCH: 24.4 pg — ABNORMAL LOW (ref 26.0–34.0)
MCHC: 31.2 g/dL (ref 30.0–36.0)
MCV: 78.2 fL — ABNORMAL LOW (ref 80.0–100.0)
Monocytes Absolute: 1.3 10*3/uL — ABNORMAL HIGH (ref 0.1–1.0)
Monocytes Relative: 12 %
Neutro Abs: 7.9 10*3/uL — ABNORMAL HIGH (ref 1.7–7.7)
Neutrophils Relative %: 74 %
Platelets: 252 10*3/uL (ref 150–400)
RBC: 4.63 MIL/uL (ref 3.87–5.11)
RDW: 15.8 % — ABNORMAL HIGH (ref 11.5–15.5)
WBC: 10.6 10*3/uL — ABNORMAL HIGH (ref 4.0–10.5)
nRBC: 0 % (ref 0.0–0.2)

## 2021-07-24 LAB — POCT INR: INR: 3.7 — AB (ref 2.0–3.0)

## 2021-07-24 NOTE — Telephone Encounter (Signed)
Able to reach pt regarding her recent lab work, Dr. Saunders [name] had a chance to review their results and advised   "Please let Molly Hodges know that her kidney function is normal.  She has a mildly elevated white blood cell count, which has been intermittently present over the last 1-2 years.  She is also slightly anemic.  Overall, findings are fairly stable.  It is okay to proceed with catheterization/PCI, as planned"  Molly Hodges very pleased with results and okay to proceed with heart cath on 12/16 with Dr. Saunders [name]. All questions were address and no additional concerns at this time.

## 2021-07-24 NOTE — Patient Instructions (Addendum)
-   skip warfarin tonight, then - continue 1 tablet every day - in preparation for your heart cath on 12/16, follow Dr. Donivan Scull instructions to hold warfarin for 5 days prior to procedure - if ok'd by Dr. Saunders [name], restart your warfarin the evening or your cath with extra 1/2 tablet for 2 days - Be sure to take aspirin 81 mg the morning of the hearth cath and daily week of procedure  - Recheck INR on 12/28  PROCEED TO [place]

## 2021-07-25 ENCOUNTER — Other Ambulatory Visit: Payer: Medicare Other

## 2021-08-01 ENCOUNTER — Other Ambulatory Visit: Payer: Self-pay | Admitting: Cardiovascular Disease

## 2021-08-01 ENCOUNTER — Telehealth: Payer: Self-pay | Admitting: *Deleted

## 2021-08-01 NOTE — Telephone Encounter (Signed)
Cardiac catheterization scheduled at [facility] for: Friday August 02, 2021 [facility] Main Entrance [facility]) at: 7 AM   Diet-no solid food after midnight prior to cath, clear liquids until 5 AM day of procedure.  Medication instructions for procedure: -Hold:   Warfarin (Coumadin)-last dose 07/27/21 until post procedure  Lasix/KCl-AM of procedure -Except hold medications usual morning medications can be taken pre-cath with sips of water including aspirin 81 mg and Plavix 75 mg.     Confirmed patient has responsible adult to drive home post procedure and be with patient first 24 hours after arriving home.  [facility] does allow one visitor to accompany you and wait in the hospital waiting room while you are there for your procedure. You and your visitor will be asked to wear a mask once you enter the hospital.   Patient reports does not currently have any new symptoms concerning for COVID-19 and no household members with COVID-19 like illness.        Reviewed procedure/mask/visitor instructions with patient.

## 2021-08-02 ENCOUNTER — Other Ambulatory Visit: Payer: Self-pay

## 2021-08-02 ENCOUNTER — Encounter (HOSPITAL_COMMUNITY): Payer: Self-pay | Admitting: Internal Medicine

## 2021-08-02 ENCOUNTER — Ambulatory Visit (HOSPITAL_COMMUNITY)
Admission: RE | Admit: 2021-08-02 | Discharge: 2021-08-03 | Disposition: A | Discharge status: Home / Self Care | Payer: Medicare Other | Attending: Internal Medicine | Admitting: Internal Medicine

## 2021-08-02 ENCOUNTER — Ambulatory Visit (HOSPITAL_COMMUNITY)
Admission: RE | Disposition: A | Discharge status: Home / Self Care | Payer: Self-pay | Source: Home / Self Care | Attending: Internal Medicine

## 2021-08-02 DIAGNOSIS — I052 Rheumatic mitral stenosis with insufficiency: Secondary | ICD-10-CM | POA: Diagnosis not present

## 2021-08-02 DIAGNOSIS — E876 Hypokalemia: Secondary | ICD-10-CM | POA: Diagnosis not present

## 2021-08-02 DIAGNOSIS — I6521 Occlusion and stenosis of right carotid artery: Secondary | ICD-10-CM | POA: Diagnosis not present

## 2021-08-02 DIAGNOSIS — I3139 Other pericardial effusion (noninflammatory): Secondary | ICD-10-CM | POA: Insufficient documentation

## 2021-08-02 DIAGNOSIS — I050 Rheumatic mitral stenosis: Secondary | ICD-10-CM | POA: Diagnosis present

## 2021-08-02 DIAGNOSIS — I480 Paroxysmal atrial fibrillation: Secondary | ICD-10-CM | POA: Insufficient documentation

## 2021-08-02 DIAGNOSIS — E785 Hyperlipidemia, unspecified: Secondary | ICD-10-CM | POA: Insufficient documentation

## 2021-08-02 DIAGNOSIS — I110 Hypertensive heart disease with heart failure: Secondary | ICD-10-CM | POA: Insufficient documentation

## 2021-08-02 DIAGNOSIS — I509 Heart failure, unspecified: Secondary | ICD-10-CM | POA: Insufficient documentation

## 2021-08-02 DIAGNOSIS — I2510 Atherosclerotic heart disease of native coronary artery without angina pectoris: Secondary | ICD-10-CM | POA: Diagnosis present

## 2021-08-02 DIAGNOSIS — J432 Centrilobular emphysema: Secondary | ICD-10-CM | POA: Diagnosis not present

## 2021-08-02 DIAGNOSIS — I208 Other forms of angina pectoris: Secondary | ICD-10-CM | POA: Diagnosis present

## 2021-08-02 DIAGNOSIS — I25118 Atherosclerotic heart disease of native coronary artery with other forms of angina pectoris: Secondary | ICD-10-CM | POA: Insufficient documentation

## 2021-08-02 DIAGNOSIS — I4819 Other persistent atrial fibrillation: Secondary | ICD-10-CM

## 2021-08-02 HISTORY — PX: INTRAVASCULAR IMAGING/OCT: CATH118326

## 2021-08-02 HISTORY — PX: CORONARY STENT INTERVENTION: CATH118234

## 2021-08-02 HISTORY — PX: LEFT HEART CATH AND CORONARY ANGIOGRAPHY: CATH118249

## 2021-08-02 LAB — POCT ACTIVATED CLOTTING TIME
Activated Clotting Time: 287 seconds
Activated Clotting Time: 251 seconds
Activated Clotting Time: 293 seconds

## 2021-08-02 LAB — PROTIME-INR
INR: 1.2 (ref 0.8–1.2)
Prothrombin Time: 15.1 seconds (ref 11.4–15.2)

## 2021-08-02 SURGERY — LEFT HEART CATH AND CORONARY ANGIOGRAPHY
Anesthesia: LOCAL

## 2021-08-02 MED ORDER — LATANOPROST 0.005 % OP SOLN
1.0000 [drp] | Freq: Every day | OPHTHALMIC | Status: DC
  Administered 2021-08-02: 1 [drp] via OPHTHALMIC
  Filled 2021-08-02: qty 2.5

## 2021-08-02 MED ORDER — ACETAMINOPHEN 325 MG PO TABS
650.0000 mg | ORAL_TABLET | ORAL | Status: DC | PRN
  Administered 2021-08-03: 650 mg via ORAL
  Filled 2021-08-02: qty 2

## 2021-08-02 MED ORDER — HEPARIN SODIUM (PORCINE) 5000 UNIT/ML IJ SOLN
5000.0000 [IU] | Freq: Three times a day (TID) | INTRAMUSCULAR | Status: DC
  Administered 2021-08-02 – 2021-08-03 (×2): 5000 [IU] via SUBCUTANEOUS
  Filled 2021-08-02 (×2): qty 1

## 2021-08-02 MED ORDER — DIAZEPAM 5 MG PO TABS: 5.0000 mg | ORAL_TABLET | Freq: Once | ORAL | Status: DC

## 2021-08-02 MED ORDER — PREGABALIN 25 MG PO CAPS
50.0000 mg | ORAL_CAPSULE | Freq: Two times a day (BID) | ORAL | Status: DC
  Administered 2021-08-02 – 2021-08-03 (×2): 50 mg via ORAL
  Filled 2021-08-02 (×2): qty 2

## 2021-08-02 MED ORDER — MIDAZOLAM HCL 2 MG/2ML IJ SOLN
INTRAMUSCULAR | Status: DC | PRN
  Administered 2021-08-02 (×2): .5 mg via INTRAVENOUS

## 2021-08-02 MED ORDER — HYDRALAZINE HCL 20 MG/ML IJ SOLN
10.0000 mg | INTRAMUSCULAR | Status: AC | PRN
  Administered 2021-08-02: 10 mg via INTRAVENOUS

## 2021-08-02 MED ORDER — LABETALOL HCL 5 MG/ML IV SOLN: 10.0000 mg | INTRAVENOUS | Status: AC | PRN

## 2021-08-02 MED ORDER — DILTIAZEM HCL ER 60 MG PO CP12
60.0000 mg | ORAL_CAPSULE | Freq: Once | ORAL | Status: AC
  Administered 2021-08-02: 60 mg via ORAL
  Filled 2021-08-02: qty 1

## 2021-08-02 MED ORDER — ASPIRIN 81 MG PO CHEW: 81.0000 mg | CHEWABLE_TABLET | ORAL | Status: DC

## 2021-08-02 MED ORDER — SODIUM CHLORIDE 0.9 % IV SOLN: INTRAVENOUS | Status: DC

## 2021-08-02 MED ORDER — FUROSEMIDE 10 MG/ML IJ SOLN
20.0000 mg | Freq: Once | INTRAMUSCULAR | Status: AC
  Administered 2021-08-02: 20 mg via INTRAVENOUS
  Filled 2021-08-02: qty 2

## 2021-08-02 MED ORDER — MIDAZOLAM HCL 2 MG/2ML IJ SOLN
INTRAMUSCULAR | Status: AC
  Filled 2021-08-02: qty 2

## 2021-08-02 MED ORDER — SODIUM CHLORIDE 0.9 % IV SOLN: 250.0000 mL | INTRAVENOUS | Status: DC | PRN

## 2021-08-02 MED ORDER — ONDANSETRON HCL 4 MG/2ML IJ SOLN: 4.0000 mg | Freq: Four times a day (QID) | INTRAMUSCULAR | Status: DC | PRN

## 2021-08-02 MED ORDER — CLOPIDOGREL BISULFATE 300 MG PO TABS
ORAL_TABLET | ORAL | Status: DC | PRN
  Administered 2021-08-02: 300 mg via ORAL

## 2021-08-02 MED ORDER — HEPARIN SODIUM (PORCINE) 1000 UNIT/ML IJ SOLN
INTRAMUSCULAR | Status: AC
  Filled 2021-08-02: qty 10

## 2021-08-02 MED ORDER — SODIUM CHLORIDE 0.9% FLUSH
3.0000 mL | Freq: Two times a day (BID) | INTRAVENOUS | Status: DC
  Administered 2021-08-03: 3 mL via INTRAVENOUS

## 2021-08-02 MED ORDER — SODIUM CHLORIDE 0.9% FLUSH: 3.0000 mL | INTRAVENOUS | Status: DC | PRN

## 2021-08-02 MED ORDER — VERAPAMIL HCL 2.5 MG/ML IV SOLN
INTRAVENOUS | Status: AC
  Filled 2021-08-02: qty 2

## 2021-08-02 MED ORDER — WARFARIN - PHARMACIST DOSING INPATIENT: Freq: Every day | Status: DC

## 2021-08-02 MED ORDER — PANTOPRAZOLE SODIUM 40 MG PO TBEC
40.0000 mg | DELAYED_RELEASE_TABLET | Freq: Every day | ORAL | Status: DC
Start: 2021-08-02 — End: 2021-08-03
  Administered 2021-08-02 – 2021-08-03 (×2): 40 mg via ORAL
  Filled 2021-08-02 (×2): qty 1

## 2021-08-02 MED ORDER — HEPARIN (PORCINE) IN NACL 2000-0.9 UNIT/L-% IV SOLN
INTRAVENOUS | Status: DC | PRN
  Administered 2021-08-02: 1000 mL

## 2021-08-02 MED ORDER — SODIUM CHLORIDE 0.9% FLUSH: 3.0000 mL | Freq: Two times a day (BID) | INTRAVENOUS | Status: DC

## 2021-08-02 MED ORDER — CLOPIDOGREL BISULFATE 300 MG PO TABS
ORAL_TABLET | ORAL | Status: AC
  Filled 2021-08-02: qty 1

## 2021-08-02 MED ORDER — IOHEXOL 350 MG/ML SOLN
INTRAVENOUS | Status: DC | PRN
  Administered 2021-08-02: 145 mL via INTRA_ARTERIAL

## 2021-08-02 MED ORDER — CLOPIDOGREL BISULFATE 75 MG PO TABS: 75.0000 mg | ORAL_TABLET | ORAL | Status: DC

## 2021-08-02 MED ORDER — DILTIAZEM HCL ER COATED BEADS 180 MG PO CP24
180.0000 mg | ORAL_CAPSULE | Freq: Every day | ORAL | Status: DC
  Administered 2021-08-03: 180 mg via ORAL
  Filled 2021-08-02: qty 1

## 2021-08-02 MED ORDER — MAGNESIUM OXIDE -MG SUPPLEMENT 400 (240 MG) MG PO TABS
400.0000 mg | ORAL_TABLET | Freq: Every day | ORAL | Status: DC
  Administered 2021-08-03: 400 mg via ORAL
  Filled 2021-08-02: qty 1

## 2021-08-02 MED ORDER — LIDOCAINE HCL (PF) 1 % IJ SOLN
INTRAMUSCULAR | Status: DC | PRN
  Administered 2021-08-02: 2 mL

## 2021-08-02 MED ORDER — NITROGLYCERIN 1 MG/10 ML FOR IR/CATH LAB
INTRA_ARTERIAL | Status: AC
  Filled 2021-08-02: qty 10

## 2021-08-02 MED ORDER — FENTANYL CITRATE (PF) 100 MCG/2ML IJ SOLN
INTRAMUSCULAR | Status: DC | PRN
  Administered 2021-08-02 (×2): 12.5 ug via INTRAVENOUS

## 2021-08-02 MED ORDER — HYDRALAZINE HCL 20 MG/ML IJ SOLN
INTRAMUSCULAR | Status: AC
  Filled 2021-08-02: qty 1

## 2021-08-02 MED ORDER — HEPARIN SODIUM (PORCINE) 1000 UNIT/ML IJ SOLN
INTRAMUSCULAR | Status: DC | PRN
  Administered 2021-08-02: 5000 [IU] via INTRAVENOUS
  Administered 2021-08-02: 2000 [IU] via INTRAVENOUS
  Administered 2021-08-02: 1000 [IU] via INTRAVENOUS

## 2021-08-02 MED ORDER — ALBUTEROL SULFATE (2.5 MG/3ML) 0.083% IN NEBU: 2.5000 mg | INHALATION_SOLUTION | Freq: Four times a day (QID) | RESPIRATORY_TRACT | Status: DC | PRN

## 2021-08-02 MED ORDER — EZETIMIBE 10 MG PO TABS
10.0000 mg | ORAL_TABLET | Freq: Every day | ORAL | Status: DC
  Administered 2021-08-03: 10 mg via ORAL
  Filled 2021-08-02: qty 1

## 2021-08-02 MED ORDER — LIDOCAINE HCL (PF) 1 % IJ SOLN
INTRAMUSCULAR | Status: AC
  Filled 2021-08-02: qty 30

## 2021-08-02 MED ORDER — SODIUM CHLORIDE 0.9 % IV SOLN: INTRAVENOUS | Status: AC

## 2021-08-02 MED ORDER — HEPARIN (PORCINE) IN NACL 1000-0.9 UT/500ML-% IV SOLN
INTRAVENOUS | Status: AC
  Filled 2021-08-02: qty 1000

## 2021-08-02 MED ORDER — WARFARIN SODIUM 2 MG PO TABS
4.0000 mg | ORAL_TABLET | Freq: Once | ORAL | Status: AC
  Administered 2021-08-02: 4 mg via ORAL
  Filled 2021-08-02: qty 2

## 2021-08-02 MED ORDER — VERAPAMIL HCL 2.5 MG/ML IV SOLN
INTRAVENOUS | Status: DC | PRN
  Administered 2021-08-02 (×2): 10 mL via INTRA_ARTERIAL

## 2021-08-02 MED ORDER — ISOSORBIDE MONONITRATE ER 30 MG PO TB24
30.0000 mg | ORAL_TABLET | Freq: Every day | ORAL | Status: DC
  Administered 2021-08-03: 30 mg via ORAL
  Filled 2021-08-02: qty 1

## 2021-08-02 MED ORDER — FENTANYL CITRATE (PF) 100 MCG/2ML IJ SOLN
INTRAMUSCULAR | Status: AC
  Filled 2021-08-02: qty 2

## 2021-08-02 MED ORDER — CLOPIDOGREL BISULFATE 75 MG PO TABS
75.0000 mg | ORAL_TABLET | Freq: Every day | ORAL | Status: DC
  Administered 2021-08-03: 75 mg via ORAL
  Filled 2021-08-02: qty 1

## 2021-08-02 MED ORDER — ROSUVASTATIN CALCIUM 20 MG PO TABS
20.0000 mg | ORAL_TABLET | Freq: Every day | ORAL | Status: DC
  Administered 2021-08-03: 20 mg via ORAL
  Filled 2021-08-02: qty 1

## 2021-08-02 MED ORDER — DORZOLAMIDE HCL 2 % OP SOLN
1.0000 [drp] | Freq: Two times a day (BID) | OPHTHALMIC | Status: DC
  Administered 2021-08-02 – 2021-08-03 (×2): 1 [drp] via OPHTHALMIC
  Filled 2021-08-02: qty 10

## 2021-08-02 MED ORDER — NITROGLYCERIN 0.4 MG SL SUBL: 0.4000 mg | SUBLINGUAL_TABLET | SUBLINGUAL | Status: DC | PRN

## 2021-08-02 MED ORDER — NITROGLYCERIN 1 MG/10 ML FOR IR/CATH LAB
INTRA_ARTERIAL | Status: DC | PRN
  Administered 2021-08-02: 200 ug
  Administered 2021-08-02: 200 ug via INTRACORONARY

## 2021-08-02 MED ORDER — ASPIRIN EC 81 MG PO TBEC
81.0000 mg | DELAYED_RELEASE_TABLET | Freq: Every day | ORAL | Status: DC
  Administered 2021-08-03: 81 mg via ORAL
  Filled 2021-08-02: qty 1

## 2021-08-02 SURGICAL SUPPLY — 22 items
BALLN SAPPHIRE 2.25X10 (BALLOONS) ×3
BALLN SAPPHIRE ~~LOC~~ 2.75X12 (BALLOONS) ×2 IMPLANT
BALLN SAPPHIRE ~~LOC~~ 3.0X18 (BALLOONS) ×2 IMPLANT
BALLOON SAPPHIRE 2.25X10 (BALLOONS) IMPLANT
CATH DRAGONFLY OPTIS 2.7FR (CATHETERS) ×2 IMPLANT
CATH INFINITI JR4 5F (CATHETERS) ×2 IMPLANT
CATH VISTA GUIDE 6FR XBLAD3.5 (CATHETERS) ×2 IMPLANT
DEVICE RAD COMP TR BAND LRG (VASCULAR PRODUCTS) ×2 IMPLANT
DEVICE RAD TR BAND REGULAR (VASCULAR PRODUCTS) ×2 IMPLANT
GLIDESHEATH SLEND SS 6F .021 (SHEATH) ×2 IMPLANT
GUIDEWIRE INQWIRE 1.5J.035X260 (WIRE) IMPLANT
INQWIRE 1.5J .035X260CM (WIRE) ×3
KIT ENCORE 26 ADVANTAGE (KITS) ×2 IMPLANT
KIT HEART LEFT (KITS) ×3 IMPLANT
PACK CARDIAC CATHETERIZATION (CUSTOM PROCEDURE TRAY) ×3 IMPLANT
STENT ONYX FRONTIER 2.5X22 (Permanent Stent) ×2 IMPLANT
STENT ONYX FRONTIER 2.75X26 (Permanent Stent) ×2 IMPLANT
TRANSDUCER W/STOPCOCK (MISCELLANEOUS) ×3 IMPLANT
TUBING CIL FLEX 10 FLL-RA (TUBING) ×3 IMPLANT
WIRE HI TORQ BMW 190CM (WIRE) ×2 IMPLANT
WIRE HI TORQ VERSACORE-J 145CM (WIRE) ×2 IMPLANT
WIRE RUNTHROUGH .014X180CM (WIRE) ×2 IMPLANT

## 2021-08-02 NOTE — Progress Notes (Signed)
ANTICOAGULATION CONSULT NOTE - Initial Consult  Pharmacy Consult for Coumadin Indication: atrial fibrillation  Allergies  Allergen Reactions   Shellfish Allergy Anaphylaxis    Other reaction(s): Hallucination   Azithromycin Other (See Comments)    Extreme burning sensation at IV site    Tamiflu [Oseltamivir Phosphate] Other (See Comments)    Reaction:  Hallucinations     Albuterol Palpitations    Heart racing.     Patient Measurements: Height: 5\' 5"  (165.1 cm) Weight: 61.5 kg (135 lb 9.3 oz) IBW/kg (Calculated) : 57 Heparin Dosing Weight: n/a  Vital Signs: Temp: 97.6 F (36.4 C) (12/16 1516) Temp Source: Oral (12/16 1516) BP: 145/52 (12/16 1516) Pulse Rate: 74 (12/16 1516)  Labs: Recent Labs    08/02/21 0703  LABPROT 15.1  INR 1.2    Estimated Creatinine Clearance: 44.6 mL/min (by C-G formula based on SCr of 0.74 mg/dL).   Medical History: Past Medical History:  Diagnosis Date   Asthma    Carotid artery disease ([place])    Community acquired pneumonia    Essential hypertension    Glaucoma    Hyperlipidemia    Mitral regurgitation    a. TTE 08/2015: EF 60-65%, normal wall motion, mild MR, mildly dilated left atrium measuring 40 mm, RVSF normal, PASP normal   PAF (paroxysmal atrial fibrillation) ([place]) 09/17/2015   a. s/p DCCV 11/15/2015; CHADS2VASc => 4 (HTN, age x 2, female); c. on Eliquis    Assessment: 87 yo female on chronic Coumadin which was held prior to admission for procedure.  INR down to 1.2 today.  Pharmacy asked to resume Coumadin.  Baseline CBC pending.  PTA Coumadin dose 2.5 mg daily per records.  Goal of Therapy:  INR 2-3 Monitor platelets by anticoagulation protocol: Yes   Plan:  Coumadin 4 mg po x 1 now. Daily INR.  [place] Crane, Roylene Reason, BCCP Clinical Pharmacist  08/02/2021 3:47 PM   [facility] pharmacy phone numbers are listed on [email or web address]

## 2021-08-02 NOTE — Interval H&P Note (Signed)
History and Physical Interval Note:  08/02/2021 9:56 AM  Molly Hodges  has presented today for surgery, with the diagnosis of stable angina.  The various methods of treatment have been discussed with the patient and family. After consideration of risks, benefits and other options for treatment, the patient has consented to  Procedure(s): LEFT HEART CATH AND CORONARY ANGIOGRAPHY (N/A) as a surgical intervention.  The patient's history has been reviewed, patient examined, no change in status, stable for surgery.  I have reviewed the patient's chart and labs.  Questions were answered to the patient's satisfaction.    Cath Lab Visit (complete for each Cath Lab visit)  Clinical Evaluation Leading to the Procedure:   ACS: No.  Non-ACS:    Anginal Classification: CCS III  Anti-ischemic medical therapy: Maximal Therapy (2 or more classes of medications)  Non-Invasive Test Results: No non-invasive testing performed  Prior CABG: No previous CABG  Molly Hodges

## 2021-08-02 NOTE — Progress Notes (Signed)
TR band removed at 1530.

## 2021-08-02 NOTE — Progress Notes (Signed)
[facility]  Patient complains of shortness of breath when walking to the bathroom and being generally uncomfortable.  She is also more aware of her heart beating.  She denies chest pain and lightheadedness.  No pain reported in the right arm at cath site.  Temp:  [97.5 F (36.4 C)-97.6 F (36.4 C)] 97.6 F (36.4 C) (12/16 1516) Pulse Rate:  [59-88] 76 (12/16 1716) Resp:  [11-40] 16 (12/16 1716) BP: (119-192)/(46-85) 119/56 (12/16 1716) SpO2:  [94 %-100 %] 99 % (12/16 1616) Weight:  [58.5 kg-61.5 kg] 61.5 kg (12/16 1516)  Gen: NAD Lungs: CTAB. Heart: IRRR w/o m/r/g Ext: Right radial cath site without hematoma or bleeding.  Symptoms may be due to mild volume overload associated with pre/post-cath hydration and furosemide being held.  HR also a little higher than baseline in the setting of diltiazem having been held this AM.  In the setting of MS, elevated HR's may be contributing as well.  No angina reported.  I will give diltiazem ER 60 mg x 1 this evening and have her resume her home dose of diltiazem XR 180 mg daily tomorrow AM.  I will also give furosemide 20 mg IV x 1.  Nelva Bush, MD [facility] 08/02/21 6:54 PM

## 2021-08-03 DIAGNOSIS — I052 Rheumatic mitral stenosis with insufficiency: Secondary | ICD-10-CM | POA: Diagnosis not present

## 2021-08-03 DIAGNOSIS — I480 Paroxysmal atrial fibrillation: Secondary | ICD-10-CM | POA: Diagnosis not present

## 2021-08-03 DIAGNOSIS — I050 Rheumatic mitral stenosis: Secondary | ICD-10-CM | POA: Diagnosis not present

## 2021-08-03 DIAGNOSIS — I3139 Other pericardial effusion (noninflammatory): Secondary | ICD-10-CM | POA: Diagnosis not present

## 2021-08-03 DIAGNOSIS — I509 Heart failure, unspecified: Secondary | ICD-10-CM | POA: Diagnosis not present

## 2021-08-03 DIAGNOSIS — E785 Hyperlipidemia, unspecified: Secondary | ICD-10-CM | POA: Diagnosis not present

## 2021-08-03 DIAGNOSIS — I4819 Other persistent atrial fibrillation: Secondary | ICD-10-CM

## 2021-08-03 DIAGNOSIS — I25118 Atherosclerotic heart disease of native coronary artery with other forms of angina pectoris: Secondary | ICD-10-CM | POA: Diagnosis not present

## 2021-08-03 DIAGNOSIS — I110 Hypertensive heart disease with heart failure: Secondary | ICD-10-CM | POA: Diagnosis not present

## 2021-08-03 DIAGNOSIS — E876 Hypokalemia: Secondary | ICD-10-CM | POA: Diagnosis not present

## 2021-08-03 DIAGNOSIS — I6521 Occlusion and stenosis of right carotid artery: Secondary | ICD-10-CM | POA: Diagnosis not present

## 2021-08-03 DIAGNOSIS — J432 Centrilobular emphysema: Secondary | ICD-10-CM | POA: Diagnosis not present

## 2021-08-03 LAB — CBC
HCT: 35.1 % — ABNORMAL LOW (ref 36.0–46.0)
Hemoglobin: 11.3 g/dL — ABNORMAL LOW (ref 12.0–15.0)
MCH: 24.6 pg — ABNORMAL LOW (ref 26.0–34.0)
MCHC: 32.2 g/dL (ref 30.0–36.0)
MCV: 76.3 fL — ABNORMAL LOW (ref 80.0–100.0)
Platelets: 232 10*3/uL (ref 150–400)
RBC: 4.6 MIL/uL (ref 3.87–5.11)
RDW: 15.8 % — ABNORMAL HIGH (ref 11.5–15.5)
WBC: 10.2 10*3/uL (ref 4.0–10.5)
nRBC: 0 % (ref 0.0–0.2)

## 2021-08-03 LAB — BASIC METABOLIC PANEL
Anion gap: 10 (ref 5–15)
BUN: 9 mg/dL (ref 8–23)
CO2: 25 mmol/L (ref 22–32)
Calcium: 8.4 mg/dL — ABNORMAL LOW (ref 8.9–10.3)
Chloride: 100 mmol/L (ref 98–111)
Creatinine, Ser: 0.76 mg/dL (ref 0.44–1.00)
GFR, Estimated: >60 mL/min (ref >60)
Glucose, Bld: 110 mg/dL — ABNORMAL HIGH (ref 70–99)
Potassium: 3.1 mmol/L — ABNORMAL LOW (ref 3.5–5.1)
Sodium: 135 mmol/L (ref 135–145)

## 2021-08-03 LAB — PROTIME-INR
INR: 1.2 (ref 0.8–1.2)
Prothrombin Time: 15.6 seconds — ABNORMAL HIGH (ref 11.4–15.2)

## 2021-08-03 MED ORDER — ENOXAPARIN SODIUM 60 MG/0.6ML IJ SOSY: 60.0000 mg | PREFILLED_SYRINGE | Freq: Two times a day (BID) | INTRAMUSCULAR | 0 refills | Status: DC

## 2021-08-03 MED ORDER — CLOPIDOGREL BISULFATE 75 MG PO TABS: 75.0000 mg | ORAL_TABLET | Freq: Every day | ORAL | 3 refills | Status: DC

## 2021-08-03 MED ORDER — POTASSIUM CHLORIDE CRYS ER 20 MEQ PO TBCR
40.0000 meq | EXTENDED_RELEASE_TABLET | Freq: Once | ORAL | Status: AC
  Administered 2021-08-03: 40 meq via ORAL
  Filled 2021-08-03: qty 2

## 2021-08-03 MED ORDER — WARFARIN SODIUM 2 MG PO TABS
4.0000 mg | ORAL_TABLET | Freq: Once | ORAL | Status: AC
  Administered 2021-08-03: 4 mg via ORAL
  Filled 2021-08-03: qty 2

## 2021-08-03 MED ORDER — ENOXAPARIN SODIUM 60 MG/0.6ML IJ SOSY
60.0000 mg | PREFILLED_SYRINGE | Freq: Two times a day (BID) | INTRAMUSCULAR | Status: DC
  Administered 2021-08-03: 60 mg via SUBCUTANEOUS
  Filled 2021-08-03: qty 0.6

## 2021-08-03 MED ORDER — FUROSEMIDE 40 MG PO TABS
40.0000 mg | ORAL_TABLET | Freq: Every day | ORAL | Status: DC
  Administered 2021-08-03: 40 mg via ORAL
  Filled 2021-08-03: qty 1

## 2021-08-03 NOTE — TOC CM/SW Note (Signed)
Telephone call made to [facility] [phone]. Spoke with Molly Hodges. Patient's Lovenox co-pay will be 80.17. Pharmacy closes at 2 pm.   Message sent to PA to make aware of co-pay amount.   Spoke with patient's daughter Molly Hodges to make aware of co-pay cost. Molly Hodges states she can have a family member pick up the prescriptions before 2 pm today at [facility]. Molly Hodges endorses she has been taught how to administer Lovenox injections.  Molly Hodges states Mrs. Molly Hodges lives with spouse. Has good family support. No further needs assessed.   Molly Rolling, MSN, RN,BSN [facility] Case Manager [phone]

## 2021-08-03 NOTE — Discharge Summary (Addendum)
Discharge Summary    Patient ID: JEMIAH CUADRA MRN: [id number]; DOB: 11/05/1931  Admit date: 08/02/2021 Discharge date: 08/03/2021  PCP:  Birdie Sons, MD   [facility] Providers Cardiologist:  Ida Rogue, MD        Discharge Diagnoses    Principal Problem:   Coronary artery disease with stable angina pectoris [facility]) Active Problems:   Persistent atrial fibrillation [facility])   Mitral valve stenosis   Coronary artery disease of native artery of native heart with stable angina pectoris [facility])   Hypokalemia   Diagnostic Studies/Procedures    Cardiac Catheterization: 08/02/2021  Diagnostic Dominance: Right Intervention       Conclusions: Severe single-vessel coronary artery disease with 80-90% ostial through mid LAD and proximal/mid D1 stenoses, as seen on recent diagnostic catheterization. Successful OCT-guided PCI to ostial through mid LAD using Onyx Frontier 2.75 x 26 mm drug-eluting stent with 0% residual stenosis and TIMI-3 flow. Successful PCI to D1 using Onyx Frontier 2.5 x 22 mm drug-eluting stent with 0% residual stenosis and TIMI-3 flow.   Recommendations: Restart warfarin tonight per pharmacy.  Continue aspirin, clopidogrel, and warfarin x1 month, after which aspirin can be discontinued.  Recommend continuation of warfarin and clopidogrel for at least 6 months. Aggressive secondary prevention. Ongoing medical therapy of atrial fibrillation and severe mitral stenosis per Dr. Rockey Situ. Overnight observation with discharge home tomorrow if no post catheterization complications occur.  History of Present Illness     HISAKO BUGH is a 87 y.o. female with with past medical history of CAD (s/p cath in 04/2021 showing severe signle vessel CAD with 80-90% proximal-mid LAD stenosis and 80% D1 stenosis but PCI postponed until decision could be made about potential CABG), persistent atrial fibrillation (s/p DCCV in 10/2015, recurrence documented in 06/2021  and started on Amiodarone load), mitral stenosis (moderate by TEE in 05/2021 --> met with CT Surgery in 06/2021 and continued medical therapy recommended), pericardial effusion, pulmonary HTN, carotid artery stenosis (s/p right sided stenting in 07/2020), chronic dizziness, HTN and asthma who presented to [facility] on 08/02/2021 for planned cardiac catheterization.   She was recently examined by Dr. Rockey Situ on 07/22/2021 and reported worsening dyspnea on exertion and episodic chest heaviness. She was started on Imdur 83m daily and repeat cath was recommended with planned intervention on her LAD. Amiodarone was held at that time since she would be off Coumadin in the interim (was not bridged with Lovenox).    Hospital Course     Consultants: None   She underwent catheterization on 08/02/2021 and this showed 80-90% ostial through mid LAD stenosis and 80% D1 stenosis. She underwent PCI/DES to D1 and PCI/DES to ostial-mid LAD with 0% residual stenosis. Was recommended she be on ASA,Plavix and Coumadin for 1 month after which ASA could be discontinued.   Following the procedure, she reported feeling uncomfortable and having shortness of breath with walking to the restroom. Symptoms were felt to be secondary to hydration for her procedure. Therefore, she received IV Lasix 264mx1 and Cardizem ER 6043mith plans to resume her home medications the following morning. Coumadin was also restarted by Pharmacy. Labs the following morning showed her Hgb remained stable at 11.3 with platelets at 232 K. Creatinine at 0.76 and K+ low at 3.1 with replacement ordered. Due to her INR being low at 1.2, Dr. NahAcie Fredricksond recommend Lovenox bridging going home due to her high thrombotic risk due to her atrial fibrillation and mitral stenosis. This was arranged with  assistance by Pharmacy and Case Management. She will receive an additional 34m of Coumadin today then restart her prior dose of 2.531mtomorrow. A staff message has been  sent to the office to arrange for an INR visit either Monday or Tuesday. She has remained off Amiodarone per her Primary Cardiologist and it will need to be readdressed whether to restart this or not at her subsequent appointments (rate remained well-controlled in the 70's to 80's this admission).    Did the patient have an acute coronary syndrome (MI, NSTEMI, STEMI, etc) this admission?:  No                               Did the patient have a percutaneous coronary intervention (stent / angioplasty)?:  Yes.     Cath/PCI Registry Performance & Quality Measures: Aspirin prescribed? - Yes ADP Receptor Inhibitor (Plavix/Clopidogrel, Brilinta/Ticagrelor or Effient/Prasugrel) prescribed (includes medically managed patients)? - Yes High Intensity Statin (Lipitor 40-8017mr Crestor 20-42m51mrescribed? - Yes For EF <40%, was ACEI/ARB prescribed? - Not Applicable (EF >/= 40%)73%r EF <40%, Aldosterone Antagonist (Spironolactone or Eplerenone) prescribed? - Not Applicable (EF >/= 40%)22%rdiac Rehab Phase II ordered? - Yes   _____________  Discharge Physical Exam and Vitals Blood pressure (!) 116/52, pulse 66, temperature 98.3 F (36.8 C), temperature source Oral, resp. rate 16, height '5\' 5"'  (1.651 m), weight 59.2 kg, SpO2 100 %.  Filed Weights   08/02/21 0654 08/02/21 1516 08/03/21 0547  Weight: 58.5 kg 61.5 kg 59.2 kg   General: Pleasant female appearing in NAD Psych: Normal affect. Neuro: Alert and oriented X 3. Moves all extremities spontaneously. HEENT: Normal  Neck: Supple without bruits or JVD. Lungs:  Resp regular and unlabored, CTA without wheezing or rales Heart: Irregularly irregular. 2/6 systolic murmur along Apex. Abdomen: Soft, non-tender, non-distended, BS + x 4.  Extremities: No clubbing, cyanosis or pitting edema. DP/PT/Radials 2+ and equal bilaterally. Radial site stable without ecchymosis or evidence of a hematoma.   Labs & Radiologic Studies    CBC Recent Labs     08/03/21 0144  WBC 10.2  HGB 11.3*  HCT 35.1*  MCV 76.3*  PLT 232 025asic Metabolic Panel Recent Labs    08/03/21 0144  NA 135  K 3.1*  CL 100  CO2 25  GLUCOSE 110*  BUN 9  CREATININE 0.76  CALCIUM 8.4*   Liver Function Tests No results for input(s): AST, ALT, ALKPHOS, BILITOT, PROT, ALBUMIN in the last 72 hours. No results for input(s): LIPASE, AMYLASE in the last 72 hours. High Sensitivity Troponin:   No results for input(s): TROPONINIHS in the last 720 hours.  BNP Invalid input(s): POCBNP D-Dimer No results for input(s): DDIMER in the last 72 hours. Hemoglobin A1C No results for input(s): HGBA1C in the last 72 hours. Fasting Lipid Panel No results for input(s): CHOL, HDL, LDLCALC, TRIG, CHOLHDL, LDLDIRECT in the last 72 hours. Thyroid Function Tests No results for input(s): TSH, T4TOTAL, T3FREE, THYROIDAB in the last 72 hours.  Invalid input(s): FREET3 _____________  CARDIAC CATHETERIZATION  Result Date: 08/02/2021 Conclusions: Severe single-vessel coronary artery disease with 80-90% ostial through mid LAD and proximal/mid D1 stenoses, as seen on recent diagnostic catheterization. Successful OCT-guided PCI to ostial through mid LAD using Onyx Frontier 2.75 x 26 mm drug-eluting stent with 0% residual stenosis and TIMI-3 flow. Successful PCI to D1 using Onyx Frontier 2.5 x 22 mm drug-eluting stent with 0%  residual stenosis and TIMI-3 flow. Recommendations: Restart warfarin tonight per pharmacy.  Continue aspirin, clopidogrel, and warfarin x1 month, after which aspirin can be discontinued.  Recommend continuation of warfarin and clopidogrel for at least 6 months. Aggressive secondary prevention. Ongoing medical therapy of atrial fibrillation and severe mitral stenosis per Dr. Rockey Situ. Overnight observation with discharge home tomorrow if no post catheterization complications occur. Nelva Bush, MD [facility]  Disposition   Pt is being discharged home today in good  condition.  Follow-up Plans & Appointments     Follow-up Information     [place] Follow up.   Specialty: Cardiology Why: The office should call you on Monday to arrange an INR check. If you do not hear from them, please call the office. Contact information: [address] [place] [phone]        Minna Merritts, MD Follow up on 08/26/2021.   Specialty: Cardiology Why: Keep scheduled Cardiology follow-up for 08/26/2021 at 10:00 AM. Contact information: [place] [address] [phone]                Discharge Instructions     AMB Referral to Cardiac Rehabilitation - Phase II   Complete by: As directed    Diagnosis: Coronary Stents   After initial evaluation and assessments completed: Virtual Based Care may be provided alone or in conjunction with Phase 2 Cardiac Rehab based on patient barriers.: Yes   Amb Referral to Cardiac Rehabilitation   Complete by: As directed    Diagnosis: Coronary Stents   After initial evaluation and assessments completed: Virtual Based Care may be provided alone or in conjunction with Phase 2 Cardiac Rehab based on patient barriers.: Yes   Diet - low sodium heart healthy   Complete by: As directed    Increase activity slowly   Complete by: As directed        Discharge Medications   Allergies as of 08/03/2021       Reactions   Shellfish Allergy Anaphylaxis   Other reaction(s): Hallucination   Azithromycin Other (See Comments)   Extreme burning sensation at IV site   Tamiflu [oseltamivir Phosphate] Other (See Comments)   Reaction:  Hallucinations    Albuterol Palpitations   Heart racing.         Medication List     TAKE these medications    albuterol 108 (90 Base) MCG/ACT inhaler Commonly known as: VENTOLIN HFA Inhale 2 puffs into the lungs every 6 (six) hours as needed for wheezing or shortness of breath.   aspirin EC 81  MG tablet Take 81 mg by mouth daily. Swallow whole. Notes to patient: WILL STOP ON 09/03/2021.   Calcium Carbonate-Vitamin D 600-400 MG-UNIT tablet Take 1 tablet by mouth daily.   cholecalciferol 1000 units tablet Commonly known as: VITAMIN D Take 1,000 Units by mouth daily.   clopidogrel 75 MG tablet Commonly known as: PLAVIX Take 1 tablet (75 mg total) by mouth daily. What changed:  how much to take how to take this when to take this additional instructions   diltiazem 180 MG 24 hr capsule Commonly known as: CARDIZEM CD TAKE 1 CAPSULE EVERY DAY   dorzolamide 2 % ophthalmic solution Commonly known as: TRUSOPT Place 1 drop into both eyes 2 (two) times daily.   enoxaparin 60 MG/0.6ML injection Commonly known as: LOVENOX Inject 0.6 mLs (60 mg total) into the skin every 12 (twelve) hours for 5 days.  ezetimibe 10 MG tablet Commonly known as: ZETIA TAKE 1 TABLET BY MOUTH DAILY.   furosemide 40 MG tablet Commonly known as: Lasix Take 1 tablet (40 mg total) by mouth daily.   ipratropium-albuterol 0.5-2.5 (3) MG/3ML Soln Commonly known as: DUONEB Take 3 mLs by nebulization every 4 (four) hours as needed.   isosorbide mononitrate 30 MG 24 hr tablet Commonly known as: IMDUR Take 1 tablet (30 mg total) by mouth daily.   latanoprost 0.005 % ophthalmic solution Commonly known as: XALATAN Place 1 drop into both eyes at bedtime.   magnesium oxide 400 MG tablet Commonly known as: MAG-OX Take 400 mg by mouth daily.   nitroGLYCERIN 0.4 MG SL tablet Commonly known as: Nitrostat Place 1 tablet (0.4 mg total) under the tongue every 5 (five) minutes as needed (for chest pain or shortness of breath).   potassium chloride 10 MEQ tablet Commonly known as: KLOR-CON TAKE 1 TABLET BY MOUTH DAILY   pregabalin 50 MG capsule Commonly known as: LYRICA TAKE 1 CAPSULE BY MOUTH 2 TIMES DAILY   PreserVision AREDS 2+Multi Vit Caps Take 2 capsules by mouth daily.   rosuvastatin 20  MG tablet Commonly known as: CRESTOR TAKE 1 TABLET BY MOUTH DAILY.   vitamin C 500 MG tablet Commonly known as: ASCORBIC ACID Take 500 mg by mouth daily.   vitamin E 45 MG (100 UNITS) capsule Take 200 Units by mouth daily.   warfarin 2.5 MG tablet Commonly known as: COUMADIN Take as directed. If you are unsure how to take this medication, talk to your nurse or doctor. Original instructions: Take 1 tablet (2.5 mg total) by mouth daily. Take as directed by the anti-coag clinic           Outstanding Labs/Studies   INR check on Monday or 11/16/2022.   Duration of Discharge Encounter   Greater than 30 minutes including physician time.  Signed, Erma Heritage, PA-C 08/03/2021, 12:23 PM  Attending Note:   The patient was seen and examined.  Agree with assessment and plan as noted above.  Changes made to the above note as needed.  Patient seen and independently examined with Bernerd Pho, [place] .   We discussed all aspects of the encounter. I agree with the assessment and plan as stated above.    CAD :   she is s/p stenting of her mi LAD .   Is doing well .  No angina  2.  Atrial fib :   has had her coumadin for > 7 days.  Will Dc amio so that we dont purposely convert her to sinus rhythm until she has had adequate anticoagulation with warfarin / lovenox  INR is 1.2   will bridge her with Lovenox  Follow up INR on Monday or Tuesday   3.  Mitral stenosis:   has moderate MS.  Following .     I have spent a total of 40 minutes with patient reviewing hospital  notes , telemetry, EKGs, labs and examining patient as well as establishing an assessment and plan that was discussed with the patient.  > 50% of time was spent in direct patient care.    Thayer Headings, Brooke Bonito., MD, [facility] 08/03/2021, 12:31 PM [address],  [place] Pager [phone]

## 2021-08-03 NOTE — Progress Notes (Signed)
ANTICOAGULATION CONSULT NOTE - Follow Up Consult  Pharmacy Consult for Coumadin Indication: atrial fibrillation  Allergies  Allergen Reactions   Shellfish Allergy Anaphylaxis    Other reaction(s): Hallucination   Azithromycin Other (See Comments)    Extreme burning sensation at IV site    Tamiflu [Oseltamivir Phosphate] Other (See Comments)    Reaction:  Hallucinations     Albuterol Palpitations    Heart racing.     Patient Measurements: Height: 5\' 5"  (165.1 cm) Weight: 59.2 kg (130 lb 8.2 oz) IBW/kg (Calculated) : 57  Vital Signs: Temp: 98.3 F (36.8 C) (12/17 0459) Temp Source: Oral (12/17 0459) BP: 116/52 (12/17 0919) Pulse Rate: 66 (12/17 0919)  Labs: Recent Labs    08/02/21 0703 08/03/21 0144 08/03/21 0944  HGB  --  11.3*  --   HCT  --  35.1*  --   PLT  --  232  --   LABPROT 15.1  --  15.6*  INR 1.2  --  1.2  CREATININE  --  0.76  --     Estimated Creatinine Clearance: 44.6 mL/min (by C-G formula based on SCr of 0.76 mg/dL).   Medications:  Scheduled:   aspirin EC  81 mg Oral Daily   clopidogrel  75 mg Oral Q breakfast   diltiazem  180 mg Oral Daily   dorzolamide  1 drop Both Eyes BID   enoxaparin (LOVENOX) injection  60 mg Subcutaneous Q12H   ezetimibe  10 mg Oral Daily   furosemide  40 mg Oral Daily   isosorbide mononitrate  30 mg Oral Daily   latanoprost  1 drop Both Eyes QHS   magnesium oxide  400 mg Oral Daily   pantoprazole  40 mg Oral Daily   pregabalin  50 mg Oral BID   rosuvastatin  20 mg Oral Daily   sodium chloride flush  3 mL Intravenous Q12H   warfarin  4 mg Oral ONCE-1600   Warfarin - Pharmacist Dosing Inpatient   Does not apply q1600    Assessment: 87 yo female on chronic Coumadin which was held prior to admission for cath procedure. INR down to 1.2 on 12/16. Pharmacy consulted to resume Coumadin.   INR 1.2 today. Hgb 11.3, plt 232. Per Cardiology, will bridge with Lovenox until INR > 2.0.   PTA Coumadin dose 2.5 mg daily  per records.  Goal of Therapy:  INR 2-3 Monitor platelets by anticoagulation protocol: Yes   Plan:  Coumadin 4 mg x 1 today Lovenox 60 mg q12h until INR > 2.0  Daily INR, CBC   Vance Peper, PharmD PGY1 Pharmacy Resident Phone [phone] 08/03/2021 10:50 AM   Please check AMION for all [place] phone numbers After 10:00 PM, call [place] [phone]

## 2021-08-03 NOTE — Plan of Care (Signed)

## 2021-08-03 NOTE — Progress Notes (Signed)
CARDIAC REHAB PHASE I   PRE:  Rate/Rhythm: 66 AF    BP: sitting 100/60    SaO2: 97 RA  MODE:  Ambulation: 470 ft   POST:  Rate/Rhythm: 76 AF    BP: sitting 138/52     SaO2: 96 RA   1030-1125 Daughter at bedside. Discharge education completed with patient and daughter including DATP therapy, activity progression, diet, risk factors, NTG, and phase 2 CR. Unsure if patient will participate as she has significant neuropathy in her feet and chronic shortness of breath. Feels more comfortable exercising at home however did give permission for phase 2 referral to [place]. Ambulated in hallway x 1 assist for chronic dizziness. Slow gait. Standing RB x 1 for shortness of breath. Utilizing pursed lip breathing. Patient to be discharged today.  Kyaire Gruenewald Minus Breeding RN, BSN

## 2021-08-03 NOTE — Progress Notes (Signed)
Educated patient and daughter how to administer Lovenox shot.  Daughter demonstrated very well.  Idolina Primer, RN

## 2021-08-05 ENCOUNTER — Encounter (HOSPITAL_COMMUNITY): Payer: Self-pay | Admitting: Internal Medicine

## 2021-08-06 ENCOUNTER — Telehealth: Payer: Self-pay | Admitting: Cardiology

## 2021-08-06 ENCOUNTER — Inpatient Hospital Stay
Admission: EM | Admit: 2021-08-06 | Discharge: 2021-08-09 | DRG: 280 | Disposition: A | Discharge status: Home / Self Care | Payer: Medicare Other | Attending: Hospitalist | Admitting: Hospitalist

## 2021-08-06 ENCOUNTER — Encounter: Payer: Self-pay | Admitting: Emergency Medicine

## 2021-08-06 ENCOUNTER — Emergency Department: Payer: Medicare Other

## 2021-08-06 ENCOUNTER — Other Ambulatory Visit: Payer: Self-pay

## 2021-08-06 DIAGNOSIS — I4819 Other persistent atrial fibrillation: Secondary | ICD-10-CM

## 2021-08-06 DIAGNOSIS — I499 Cardiac arrhythmia, unspecified: Secondary | ICD-10-CM | POA: Diagnosis not present

## 2021-08-06 DIAGNOSIS — I110 Hypertensive heart disease with heart failure: Secondary | ICD-10-CM | POA: Diagnosis present

## 2021-08-06 DIAGNOSIS — I739 Peripheral vascular disease, unspecified: Secondary | ICD-10-CM | POA: Diagnosis present

## 2021-08-06 DIAGNOSIS — J90 Pleural effusion, not elsewhere classified: Secondary | ICD-10-CM | POA: Diagnosis not present

## 2021-08-06 DIAGNOSIS — E785 Hyperlipidemia, unspecified: Secondary | ICD-10-CM | POA: Diagnosis present

## 2021-08-06 DIAGNOSIS — J45901 Unspecified asthma with (acute) exacerbation: Secondary | ICD-10-CM | POA: Diagnosis not present

## 2021-08-06 DIAGNOSIS — I5032 Chronic diastolic (congestive) heart failure: Secondary | ICD-10-CM | POA: Diagnosis not present

## 2021-08-06 DIAGNOSIS — R509 Fever, unspecified: Secondary | ICD-10-CM | POA: Diagnosis not present

## 2021-08-06 DIAGNOSIS — R069 Unspecified abnormalities of breathing: Secondary | ICD-10-CM | POA: Diagnosis not present

## 2021-08-06 DIAGNOSIS — R7989 Other specified abnormal findings of blood chemistry: Secondary | ICD-10-CM | POA: Diagnosis present

## 2021-08-06 DIAGNOSIS — B9710 Unspecified enterovirus as the cause of diseases classified elsewhere: Secondary | ICD-10-CM | POA: Diagnosis present

## 2021-08-06 DIAGNOSIS — I5033 Acute on chronic diastolic (congestive) heart failure: Secondary | ICD-10-CM | POA: Insufficient documentation

## 2021-08-06 DIAGNOSIS — Z7901 Long term (current) use of anticoagulants: Secondary | ICD-10-CM

## 2021-08-06 DIAGNOSIS — Z955 Presence of coronary angioplasty implant and graft: Secondary | ICD-10-CM

## 2021-08-06 DIAGNOSIS — J9620 Acute and chronic respiratory failure, unspecified whether with hypoxia or hypercapnia: Secondary | ICD-10-CM | POA: Diagnosis not present

## 2021-08-06 DIAGNOSIS — E782 Mixed hyperlipidemia: Secondary | ICD-10-CM | POA: Diagnosis not present

## 2021-08-06 DIAGNOSIS — I214 Non-ST elevation (NSTEMI) myocardial infarction: Secondary | ICD-10-CM | POA: Diagnosis not present

## 2021-08-06 DIAGNOSIS — E039 Hypothyroidism, unspecified: Secondary | ICD-10-CM | POA: Diagnosis not present

## 2021-08-06 DIAGNOSIS — Z881 Allergy status to other antibiotic agents status: Secondary | ICD-10-CM | POA: Diagnosis not present

## 2021-08-06 DIAGNOSIS — I25118 Atherosclerotic heart disease of native coronary artery with other forms of angina pectoris: Secondary | ICD-10-CM | POA: Diagnosis not present

## 2021-08-06 DIAGNOSIS — I10 Essential (primary) hypertension: Secondary | ICD-10-CM | POA: Diagnosis present

## 2021-08-06 DIAGNOSIS — J441 Chronic obstructive pulmonary disease with (acute) exacerbation: Secondary | ICD-10-CM | POA: Diagnosis present

## 2021-08-06 DIAGNOSIS — Z20822 Contact with and (suspected) exposure to covid-19: Secondary | ICD-10-CM | POA: Diagnosis present

## 2021-08-06 DIAGNOSIS — Z91013 Allergy to seafood: Secondary | ICD-10-CM | POA: Diagnosis not present

## 2021-08-06 DIAGNOSIS — J440 Chronic obstructive pulmonary disease with acute lower respiratory infection: Secondary | ICD-10-CM | POA: Diagnosis not present

## 2021-08-06 DIAGNOSIS — E871 Hypo-osmolality and hyponatremia: Secondary | ICD-10-CM | POA: Diagnosis present

## 2021-08-06 DIAGNOSIS — B9789 Other viral agents as the cause of diseases classified elsewhere: Secondary | ICD-10-CM | POA: Diagnosis present

## 2021-08-06 DIAGNOSIS — Z79899 Other long term (current) drug therapy: Secondary | ICD-10-CM

## 2021-08-06 DIAGNOSIS — I340 Nonrheumatic mitral (valve) insufficiency: Secondary | ICD-10-CM | POA: Diagnosis not present

## 2021-08-06 DIAGNOSIS — R059 Cough, unspecified: Secondary | ICD-10-CM | POA: Diagnosis not present

## 2021-08-06 DIAGNOSIS — Z8249 Family history of ischemic heart disease and other diseases of the circulatory system: Secondary | ICD-10-CM

## 2021-08-06 DIAGNOSIS — Z7982 Long term (current) use of aspirin: Secondary | ICD-10-CM

## 2021-08-06 DIAGNOSIS — R0602 Shortness of breath: Secondary | ICD-10-CM | POA: Diagnosis not present

## 2021-08-06 DIAGNOSIS — H409 Unspecified glaucoma: Secondary | ICD-10-CM | POA: Diagnosis not present

## 2021-08-06 DIAGNOSIS — Z7902 Long term (current) use of antithrombotics/antiplatelets: Secondary | ICD-10-CM

## 2021-08-06 DIAGNOSIS — I2510 Atherosclerotic heart disease of native coronary artery without angina pectoris: Secondary | ICD-10-CM | POA: Diagnosis present

## 2021-08-06 DIAGNOSIS — Z888 Allergy status to other drugs, medicaments and biological substances status: Secondary | ICD-10-CM | POA: Diagnosis not present

## 2021-08-06 DIAGNOSIS — J4521 Mild intermittent asthma with (acute) exacerbation: Secondary | ICD-10-CM | POA: Diagnosis not present

## 2021-08-06 DIAGNOSIS — I480 Paroxysmal atrial fibrillation: Secondary | ICD-10-CM | POA: Diagnosis not present

## 2021-08-06 DIAGNOSIS — I517 Cardiomegaly: Secondary | ICD-10-CM | POA: Diagnosis not present

## 2021-08-06 DIAGNOSIS — D509 Iron deficiency anemia, unspecified: Secondary | ICD-10-CM | POA: Diagnosis not present

## 2021-08-06 DIAGNOSIS — Z743 Need for continuous supervision: Secondary | ICD-10-CM | POA: Diagnosis not present

## 2021-08-06 HISTORY — DX: Atherosclerotic heart disease of native coronary artery without angina pectoris: I25.10

## 2021-08-06 HISTORY — DX: Atherosclerosis of aorta: I70.0

## 2021-08-06 HISTORY — DX: Rheumatic mitral stenosis: I05.0

## 2021-08-06 LAB — COMPREHENSIVE METABOLIC PANEL
ALT: 55 U/L — ABNORMAL HIGH (ref 0–44)
AST: 54 U/L — ABNORMAL HIGH (ref 15–41)
Albumin: 3.3 g/dL — ABNORMAL LOW (ref 3.5–5.0)
Alkaline Phosphatase: 87 U/L (ref 38–126)
Anion gap: 10 (ref 5–15)
BUN: 10 mg/dL (ref 8–23)
CO2: 25 mmol/L (ref 22–32)
Calcium: 8.4 mg/dL — ABNORMAL LOW (ref 8.9–10.3)
Chloride: 97 mmol/L — ABNORMAL LOW (ref 98–111)
Creatinine, Ser: 0.72 mg/dL (ref 0.44–1.00)
GFR, Estimated: >60 mL/min (ref >60)
Glucose, Bld: 126 mg/dL — ABNORMAL HIGH (ref 70–99)
Potassium: 3.5 mmol/L (ref 3.5–5.1)
Sodium: 132 mmol/L — ABNORMAL LOW (ref 135–145)
Total Bilirubin: 0.9 mg/dL (ref 0.3–1.2)
Total Protein: 6.4 g/dL — ABNORMAL LOW (ref 6.5–8.1)

## 2021-08-06 LAB — CBC WITH DIFFERENTIAL/PLATELET
Abs Immature Granulocytes: 0.05 10*3/uL (ref 0.00–0.07)
Basophils Absolute: 0.1 10*3/uL (ref 0.0–0.1)
Basophils Relative: 1 %
Eosinophils Absolute: 0.1 10*3/uL (ref 0.0–0.5)
Eosinophils Relative: 1 %
HCT: 33.7 % — ABNORMAL LOW (ref 36.0–46.0)
Hemoglobin: 10.7 g/dL — ABNORMAL LOW (ref 12.0–15.0)
Immature Granulocytes: 1 %
Lymphocytes Relative: 12 %
Lymphs Abs: 1.1 10*3/uL (ref 0.7–4.0)
MCH: 24.2 pg — ABNORMAL LOW (ref 26.0–34.0)
MCHC: 31.8 g/dL (ref 30.0–36.0)
MCV: 76.2 fL — ABNORMAL LOW (ref 80.0–100.0)
Monocytes Absolute: 1 10*3/uL (ref 0.1–1.0)
Monocytes Relative: 11 %
Neutro Abs: 6.9 10*3/uL (ref 1.7–7.7)
Neutrophils Relative %: 74 %
Platelets: 206 10*3/uL (ref 150–400)
RBC: 4.42 MIL/uL (ref 3.87–5.11)
RDW: 15.7 % — ABNORMAL HIGH (ref 11.5–15.5)
WBC: 9.3 10*3/uL (ref 4.0–10.5)
nRBC: 0 % (ref 0.0–0.2)

## 2021-08-06 LAB — HEMOGLOBIN A1C
Hgb A1c MFr Bld: 6.2 % — ABNORMAL HIGH (ref 4.8–5.6)
Mean Plasma Glucose: 131 mg/dL

## 2021-08-06 LAB — HEPARIN LEVEL (UNFRACTIONATED)
Heparin Unfractionated: 0.35 IU/mL (ref 0.30–0.70)
Heparin Unfractionated: 0.25 IU/mL — ABNORMAL LOW (ref 0.30–0.70)

## 2021-08-06 LAB — PROTIME-INR
INR: 1.1 (ref 0.8–1.2)
Prothrombin Time: 14.7 seconds (ref 11.4–15.2)

## 2021-08-06 LAB — TROPONIN I (HIGH SENSITIVITY)
Troponin I (High Sensitivity): 1505 ng/L (ref <18)
Troponin I (High Sensitivity): 1284 ng/L (ref <18)
Troponin I (High Sensitivity): 1236 ng/L (ref <18)

## 2021-08-06 LAB — APTT: aPTT: 40 seconds — ABNORMAL HIGH (ref 24–36)

## 2021-08-06 LAB — RESP PANEL BY RT-PCR (FLU A&B, COVID) ARPGX2
Influenza A by PCR: NEGATIVE
Influenza B by PCR: NEGATIVE
SARS Coronavirus 2 by RT PCR: NEGATIVE

## 2021-08-06 LAB — D-DIMER, QUANTITATIVE: D-Dimer, Quant: 12.17 ug/mL-FEU — ABNORMAL HIGH (ref 0.00–0.50)

## 2021-08-06 LAB — BRAIN NATRIURETIC PEPTIDE: B Natriuretic Peptide: 555.3 pg/mL — ABNORMAL HIGH (ref 0.0–100.0)

## 2021-08-06 MED ORDER — IPRATROPIUM BROMIDE 0.02 % IN SOLN: 0.5000 mg | RESPIRATORY_TRACT | Status: DC | PRN

## 2021-08-06 MED ORDER — SODIUM CHLORIDE 0.9% FLUSH: 3.0000 mL | INTRAVENOUS | Status: DC | PRN

## 2021-08-06 MED ORDER — IPRATROPIUM-ALBUTEROL 0.5-2.5 (3) MG/3ML IN SOLN: 3.0000 mL | Freq: Once | RESPIRATORY_TRACT | Status: DC

## 2021-08-06 MED ORDER — IPRATROPIUM-ALBUTEROL 0.5-2.5 (3) MG/3ML IN SOLN
3.0000 mL | RESPIRATORY_TRACT | Status: DC | PRN
  Administered 2021-08-08: 05:00:00 3 mL via RESPIRATORY_TRACT
  Filled 2021-08-06: qty 3

## 2021-08-06 MED ORDER — METHYLPREDNISOLONE SODIUM SUCC 125 MG IJ SOLR
80.0000 mg | INTRAMUSCULAR | Status: DC
  Administered 2021-08-06 – 2021-08-07 (×2): 80 mg via INTRAVENOUS
  Filled 2021-08-06 (×2): qty 2

## 2021-08-06 MED ORDER — DILTIAZEM HCL ER COATED BEADS 180 MG PO CP24
180.0000 mg | ORAL_CAPSULE | Freq: Every day | ORAL | Status: DC
  Administered 2021-08-08 – 2021-08-09 (×2): 180 mg via ORAL
  Filled 2021-08-06 (×3): qty 1

## 2021-08-06 MED ORDER — ISOSORBIDE MONONITRATE ER 30 MG PO TB24
30.0000 mg | ORAL_TABLET | Freq: Every day | ORAL | Status: DC
  Administered 2021-08-06 – 2021-08-09 (×3): 30 mg via ORAL
  Filled 2021-08-06 (×3): qty 1

## 2021-08-06 MED ORDER — OCUVITE-LUTEIN PO CAPS
2.0000 | ORAL_CAPSULE | Freq: Every day | ORAL | Status: DC
  Administered 2021-08-08 – 2021-08-09 (×2): 2 via ORAL
  Filled 2021-08-06 (×4): qty 2

## 2021-08-06 MED ORDER — FUROSEMIDE 40 MG PO TABS: 40.0000 mg | ORAL_TABLET | Freq: Every day | ORAL | Status: DC

## 2021-08-06 MED ORDER — EZETIMIBE 10 MG PO TABS
10.0000 mg | ORAL_TABLET | Freq: Every day | ORAL | Status: DC
  Administered 2021-08-08 – 2021-08-09 (×2): 10 mg via ORAL
  Filled 2021-08-06 (×3): qty 1

## 2021-08-06 MED ORDER — ASPIRIN EC 81 MG PO TBEC
81.0000 mg | DELAYED_RELEASE_TABLET | Freq: Every day | ORAL | Status: DC
  Administered 2021-08-08 – 2021-08-09 (×2): 81 mg via ORAL
  Filled 2021-08-06 (×2): qty 1

## 2021-08-06 MED ORDER — HYDRALAZINE HCL 20 MG/ML IJ SOLN: 5.0000 mg | INTRAMUSCULAR | Status: DC | PRN

## 2021-08-06 MED ORDER — ONDANSETRON HCL 4 MG/2ML IJ SOLN: 4.0000 mg | Freq: Three times a day (TID) | INTRAMUSCULAR | Status: DC | PRN

## 2021-08-06 MED ORDER — MAGNESIUM OXIDE 400 MG PO TABS
400.0000 mg | ORAL_TABLET | Freq: Every day | ORAL | Status: DC
  Administered 2021-08-06 – 2021-08-09 (×3): 400 mg via ORAL
  Filled 2021-08-06 (×7): qty 1

## 2021-08-06 MED ORDER — SODIUM CHLORIDE 0.9 % IV SOLN: INTRAVENOUS | Status: DC

## 2021-08-06 MED ORDER — VITAMIN E 45 MG (100 UNIT) PO CAPS
200.0000 [IU] | ORAL_CAPSULE | Freq: Every day | ORAL | Status: DC
  Administered 2021-08-08 – 2021-08-09 (×2): 200 [IU] via ORAL
  Filled 2021-08-06 (×4): qty 2

## 2021-08-06 MED ORDER — IPRATROPIUM BROMIDE 0.02 % IN SOLN
0.5000 mg | RESPIRATORY_TRACT | Status: DC
  Administered 2021-08-06: 12:00:00 0.5 mg via RESPIRATORY_TRACT
  Filled 2021-08-06: qty 2.5

## 2021-08-06 MED ORDER — FUROSEMIDE 40 MG PO TABS
40.0000 mg | ORAL_TABLET | Freq: Every day | ORAL | Status: DC
  Administered 2021-08-06 – 2021-08-09 (×3): 40 mg via ORAL
  Filled 2021-08-06 (×3): qty 1

## 2021-08-06 MED ORDER — ASPIRIN 81 MG PO CHEW
324.0000 mg | CHEWABLE_TABLET | Freq: Once | ORAL | Status: AC
  Administered 2021-08-06: 11:00:00 324 mg via ORAL
  Filled 2021-08-06: qty 4

## 2021-08-06 MED ORDER — SODIUM CHLORIDE 0.9 % IV SOLN: 250.0000 mL | INTRAVENOUS | Status: DC | PRN

## 2021-08-06 MED ORDER — VITAMIN D 25 MCG (1000 UNIT) PO TABS
1000.0000 [IU] | ORAL_TABLET | Freq: Every day | ORAL | Status: DC
  Administered 2021-08-06 – 2021-08-09 (×3): 1000 [IU] via ORAL
  Filled 2021-08-06 (×3): qty 1

## 2021-08-06 MED ORDER — HEPARIN BOLUS VIA INFUSION
850.0000 [IU] | Freq: Once | INTRAVENOUS | Status: AC
  Administered 2021-08-06: 22:00:00 850 [IU] via INTRAVENOUS
  Filled 2021-08-06: qty 850

## 2021-08-06 MED ORDER — HEPARIN (PORCINE) 25000 UT/250ML-% IV SOLN
850.0000 [IU]/h | INTRAVENOUS | Status: DC
  Administered 2021-08-06: 13:00:00 700 [IU]/h via INTRAVENOUS
  Filled 2021-08-06: qty 250

## 2021-08-06 MED ORDER — CLOPIDOGREL BISULFATE 75 MG PO TABS
75.0000 mg | ORAL_TABLET | Freq: Every day | ORAL | Status: DC
  Administered 2021-08-06 – 2021-08-09 (×3): 75 mg via ORAL
  Filled 2021-08-06 (×3): qty 1

## 2021-08-06 MED ORDER — ROSUVASTATIN CALCIUM 10 MG PO TABS
20.0000 mg | ORAL_TABLET | Freq: Every day | ORAL | Status: DC
  Administered 2021-08-08 – 2021-08-09 (×2): 20 mg via ORAL
  Filled 2021-08-06: qty 2
  Filled 2021-08-06: qty 1
  Filled 2021-08-06: qty 2

## 2021-08-06 MED ORDER — ASCORBIC ACID 500 MG PO TABS
500.0000 mg | ORAL_TABLET | Freq: Every day | ORAL | Status: DC
  Administered 2021-08-06 – 2021-08-09 (×3): 500 mg via ORAL
  Filled 2021-08-06 (×3): qty 1

## 2021-08-06 MED ORDER — DORZOLAMIDE HCL 2 % OP SOLN
1.0000 [drp] | Freq: Two times a day (BID) | OPHTHALMIC | Status: DC
  Administered 2021-08-06 – 2021-08-09 (×5): 1 [drp] via OPHTHALMIC
  Filled 2021-08-06 (×2): qty 10

## 2021-08-06 MED ORDER — NITROGLYCERIN 0.4 MG SL SUBL: 0.4000 mg | SUBLINGUAL_TABLET | SUBLINGUAL | Status: DC | PRN

## 2021-08-06 MED ORDER — IPRATROPIUM-ALBUTEROL 0.5-2.5 (3) MG/3ML IN SOLN: 3.0000 mL | RESPIRATORY_TRACT | Status: DC

## 2021-08-06 MED ORDER — SODIUM CHLORIDE 0.9% FLUSH
3.0000 mL | Freq: Two times a day (BID) | INTRAVENOUS | Status: DC
  Administered 2021-08-06 – 2021-08-09 (×5): 3 mL via INTRAVENOUS

## 2021-08-06 MED ORDER — ALBUTEROL SULFATE (2.5 MG/3ML) 0.083% IN NEBU
2.5000 mg | INHALATION_SOLUTION | RESPIRATORY_TRACT | Status: DC | PRN
  Administered 2021-08-07 – 2021-08-08 (×3): 2.5 mg via RESPIRATORY_TRACT
  Filled 2021-08-06 (×3): qty 3

## 2021-08-06 MED ORDER — PREGABALIN 50 MG PO CAPS
50.0000 mg | ORAL_CAPSULE | Freq: Two times a day (BID) | ORAL | Status: DC
  Administered 2021-08-06 – 2021-08-09 (×6): 50 mg via ORAL
  Filled 2021-08-06 (×6): qty 1

## 2021-08-06 MED ORDER — ASPIRIN 81 MG PO CHEW
81.0000 mg | CHEWABLE_TABLET | ORAL | Status: AC
  Administered 2021-08-07: 06:00:00 81 mg via ORAL
  Filled 2021-08-06: qty 1

## 2021-08-06 MED ORDER — METHYLPREDNISOLONE SODIUM SUCC 125 MG IJ SOLR
125.0000 mg | Freq: Once | INTRAMUSCULAR | Status: AC
  Administered 2021-08-06: 12:00:00 125 mg via INTRAVENOUS
  Filled 2021-08-06: qty 2

## 2021-08-06 MED ORDER — IOHEXOL 350 MG/ML SOLN
75.0000 mL | Freq: Once | INTRAVENOUS | Status: AC | PRN
  Administered 2021-08-06: 09:00:00 75 mL via INTRAVENOUS

## 2021-08-06 MED ORDER — OYSTER SHELL CALCIUM/D3 500-5 MG-MCG PO TABS
1.0000 | ORAL_TABLET | Freq: Every day | ORAL | Status: DC
  Administered 2021-08-06 – 2021-08-09 (×3): 1 via ORAL
  Filled 2021-08-06 (×3): qty 1

## 2021-08-06 MED ORDER — LATANOPROST 0.005 % OP SOLN
1.0000 [drp] | Freq: Every day | OPHTHALMIC | Status: DC
  Administered 2021-08-06 – 2021-08-08 (×3): 1 [drp] via OPHTHALMIC
  Filled 2021-08-06: qty 2.5

## 2021-08-06 MED ORDER — DM-GUAIFENESIN ER 30-600 MG PO TB12
1.0000 | ORAL_TABLET | Freq: Two times a day (BID) | ORAL | Status: DC | PRN
  Administered 2021-08-07 – 2021-08-09 (×3): 1 via ORAL
  Filled 2021-08-06 (×3): qty 1

## 2021-08-06 MED ORDER — HEPARIN BOLUS VIA INFUSION
3000.0000 [IU] | Freq: Once | INTRAVENOUS | Status: AC
Start: 2021-08-06 — End: 2021-08-06
  Administered 2021-08-06: 13:00:00 3000 [IU] via INTRAVENOUS
  Filled 2021-08-06: qty 3000

## 2021-08-06 MED ORDER — FUROSEMIDE 10 MG/ML IJ SOLN
40.0000 mg | Freq: Two times a day (BID) | INTRAMUSCULAR | Status: DC
  Administered 2021-08-06: 12:00:00 40 mg via INTRAVENOUS
  Filled 2021-08-06: qty 4

## 2021-08-06 MED ORDER — ALBUTEROL SULFATE HFA 108 (90 BASE) MCG/ACT IN AERS
2.0000 | INHALATION_SPRAY | RESPIRATORY_TRACT | Status: DC | PRN
  Filled 2021-08-06: qty 6.7

## 2021-08-06 NOTE — Consult Note (Signed)
ANTICOAGULATION CONSULT NOTE - Initial Consult  Pharmacy Consult for Heparin Drip Indication: chest pain/ACS  Allergies  Allergen Reactions   Shellfish Allergy Anaphylaxis    Other reaction(s): Hallucination   Azithromycin Other (See Comments)    Extreme burning sensation at IV site    Tamiflu [Oseltamivir Phosphate] Other (See Comments)    Reaction:  Hallucinations     Albuterol Palpitations    Heart racing.     Patient Measurements: Height: 5\' 5"  (165.1 cm) Weight: 58.5 kg (128 lb 14.4 oz) IBW/kg (Calculated) : 57 Heparin Dosing Weight: 58.5kg  Vital Signs: Temp: 98 F (36.7 C) (12/20 1831) Temp Source: Oral (12/20 1831) BP: 124/61 (12/20 1831) Pulse Rate: 82 (12/20 1831)  Labs: Recent Labs    08/06/21 0802 08/06/21 0909 08/06/21 1030 08/06/21 1219 08/06/21 2144  HGB 10.7*  --   --   --   --   HCT 33.7*  --   --   --   --   PLT 206  --   --   --   --   APTT  --  40*  --   --   --   LABPROT  --  14.7  --   --   --   INR  --  1.1  --   --   --   HEPARINUNFRC  --   --   --  0.35 0.25*  CREATININE 0.72  --   --   --   --   TROPONINIHS 1,505*  --  1,284* 1,236*  --      Estimated Creatinine Clearance: 44.6 mL/min (by C-G formula based on SCr of 0.72 mg/dL).   Medical History: Past Medical History:  Diagnosis Date   Aortic atherosclerosis ([place])    a. 05/2021 TEE: GrIII atheroma plaque involving the asc, transverse, and desc Ao.   Asthma    CAD (coronary artery disease)    a. 04/2021 Cath: LM nl, LAD 85p/m, D1 80, RI nl, LCX nl, RCA nl; b. 07/2021 PCI: pLAD (2.75x26 Onyx Frontier DES), D1 (2.5x22 Onyx Frontier DES).   Carotid artery disease ([place])    a. s/p R carotid stenting (19mm x 9mm x 4cm long Exact stent); b. 06/2021 U/S: RICA 16-10%, LICA 96-04%.   Community acquired pneumonia    Essential hypertension    Glaucoma    Hyperlipidemia    Mitral regurgitation    a. TTE 08/2015: EF 60-65%, normal wall motion, mild MR, mildly dilated left atrium measuring  40 mm, RVSF normal, PASP normal; b. 05/2021 TEE: Moderate MR.   Mitral stenosis    a. 05/2021 L/RHC: Sev MS w/ mean grad 13-36mmHg and MV area of 0.5-06.cm^2; b. 05/2021 TEE: EF 55-60%, no rwma, nl RV fxn, mod MR, mod MS (MV area by P1/2t: 1.61 cm^2 w/ mean grad of 78mmHg).   Persistent atrial fibrillation ([place]) 09/17/2015   a. s/p DCCV 11/15/2015; b. CHADS2VASc => 4 (HTN, age x 2, female)--> warfarin; c. 06/2021 recurrent afib-->amio added.    Medications:  Medications Prior to Admission  Medication Sig Dispense Refill Last Dose   albuterol (VENTOLIN HFA) 108 (90 Base) MCG/ACT inhaler Inhale 2 puffs into the lungs every 6 (six) hours as needed for wheezing or shortness of breath. 8 g 2 08/06/2021   aspirin EC 81 MG tablet Take 81 mg by mouth daily. Swallow whole.   08/05/2021   Calcium Carbonate-Vitamin D 600-400 MG-UNIT tablet Take 1 tablet by mouth daily.   08/05/2021   cholecalciferol (  VITAMIN D) 1000 UNITS tablet Take 1,000 Units by mouth daily.   08/05/2021   clopidogrel (PLAVIX) 75 MG tablet Take 1 tablet (75 mg total) by mouth daily. 90 tablet 3 08/05/2021   diltiazem (CARDIZEM CD) 180 MG 24 hr capsule TAKE 1 CAPSULE EVERY DAY 30 capsule 11 08/05/2021   dorzolamide (TRUSOPT) 2 % ophthalmic solution Place 1 drop into both eyes 2 (two) times daily.    08/05/2021   enoxaparin (LOVENOX) 60 MG/0.6ML injection Inject 0.6 mLs (60 mg total) into the skin every 12 (twelve) hours for 5 days. 6 mL 0 08/05/2021 at 2200   ezetimibe (ZETIA) 10 MG tablet TAKE 1 TABLET BY MOUTH DAILY. 90 tablet 3 08/05/2021   furosemide (LASIX) 40 MG tablet Take 1 tablet (40 mg total) by mouth daily. 30 tablet 5 08/05/2021   ipratropium-albuterol (DUONEB) 0.5-2.5 (3) MG/3ML SOLN Take 3 mLs by nebulization every 4 (four) hours as needed. 360 mL 1 08/06/2021   isosorbide mononitrate (IMDUR) 30 MG 24 hr tablet Take 1 tablet (30 mg total) by mouth daily. 90 tablet 3 08/05/2021   latanoprost (XALATAN) 0.005 % ophthalmic  solution Place 1 drop into both eyes at bedtime.    08/05/2021   magnesium oxide (MAG-OX) 400 MG tablet Take 400 mg by mouth daily.   08/05/2021   Multiple Vitamins-Minerals (PRESERVISION AREDS 2+MULTI VIT) CAPS Take 2 capsules by mouth daily.   08/05/2021   potassium chloride (KLOR-CON) 10 MEQ tablet TAKE 1 TABLET BY MOUTH DAILY 90 tablet 0 08/05/2021   pregabalin (LYRICA) 50 MG capsule TAKE 1 CAPSULE BY MOUTH 2 TIMES DAILY 60 capsule 5 08/05/2021   rosuvastatin (CRESTOR) 20 MG tablet TAKE 1 TABLET BY MOUTH DAILY. 90 tablet 3 08/05/2021   vitamin C (ASCORBIC ACID) 500 MG tablet Take 500 mg by mouth daily.   08/05/2021   vitamin E 100 UNIT capsule Take 200 Units by mouth daily.   08/05/2021   warfarin (COUMADIN) 2.5 MG tablet Take 1 tablet (2.5 mg total) by mouth daily. Take as directed by the anti-coag clinic 30 tablet 1 08/05/2021 at am   nitroGLYCERIN (NITROSTAT) 0.4 MG SL tablet Place 1 tablet (0.4 mg total) under the tongue every 5 (five) minutes as needed (for chest pain or shortness of breath). 100 tablet 3 prn at prn    Assessment: Patient admitted to ER with SOB. PMH includes CAD, HTN, mitral stenosis, and A.Fib (on warfarin PTA). Noted elevated troponin and subtherapeutic INR. Cath performed by Dr End on 08/02/2021.  12/20 2144 HL 0.25 Subthera, 700 > 850 units/hr  Goal of Therapy:  Heparin level 0.3-0.7 units/ml Monitor platelets by anticoagulation protocol: Yes   Plan:  Heparin subtherapeutic. Give 850 units bolus x 1 Increase heparin infusion to 850 units/hr Check anti-Xa level in 8 hours following rate change Continue to monitor H&H and platelets  Dorothe Pea, PharmD, BCPS Clinical Pharmacist   08/06/2021 10:16 PM

## 2021-08-06 NOTE — Consult Note (Signed)
ANTICOAGULATION CONSULT NOTE - Initial Consult  Pharmacy Consult for Heparin Drip Indication: chest pain/ACS  Allergies  Allergen Reactions   Shellfish Allergy Anaphylaxis    Other reaction(s): Hallucination   Azithromycin Other (See Comments)    Extreme burning sensation at IV site    Tamiflu [Oseltamivir Phosphate] Other (See Comments)    Reaction:  Hallucinations     Albuterol Palpitations    Heart racing.     Patient Measurements: Height: 5\' 5"  (165.1 cm) Weight: 58.5 kg (129 lb) IBW/kg (Calculated) : 57 Heparin Dosing Weight: 58.5kg  Vital Signs: Temp: 98.4 F (36.9 C) (12/20 0801) Temp Source: Oral (12/20 0801) BP: 139/63 (12/20 1030) Pulse Rate: 80 (12/20 1030)  Labs: Recent Labs    08/06/21 0802 08/06/21 0909  HGB 10.7*  --   HCT 33.7*  --   PLT 206  --   LABPROT  --  14.7  INR  --  1.1  CREATININE 0.72  --   TROPONINIHS 1,505*  --     Estimated Creatinine Clearance: 44.6 mL/min (by C-G formula based on SCr of 0.72 mg/dL).   Medical History: Past Medical History:  Diagnosis Date   Asthma    Carotid artery disease ([place])    Community acquired pneumonia    Essential hypertension    Glaucoma    Hyperlipidemia    Mitral regurgitation    a. TTE 08/2015: EF 60-65%, normal wall motion, mild MR, mildly dilated left atrium measuring 40 mm, RVSF normal, PASP normal   PAF (paroxysmal atrial fibrillation) ([place]) 09/17/2015   a. s/p DCCV 11/15/2015; CHADS2VASc => 4 (HTN, age x 2, female); c. on Eliquis    Medications:  (Not in a hospital admission)   Assessment: Patient admitted to ER with SOB. PMH includes CAD, HTN, mitral stenosis, and A.Fib (on warfarin PTA). Noted elevated troponin and subtherapeutic INR. Cath performed by Dr End on 08/02/2021.  Goal of Therapy:  Heparin level 0.3-0.7 units/ml Monitor platelets by anticoagulation protocol: Yes   Plan:  Give 3000 units bolus x 1 Start heparin infusion at 700 units/hr Check anti-Xa level in 8  hours and daily while on heparin Continue to monitor H&H and platelets  Joscelynn Brutus Rodriguez-Guzman PharmD, BCPS 08/06/2021 10:57 AM

## 2021-08-06 NOTE — H&P (View-Only) (Signed)
Cardiology Consult    Patient ID: Molly Hodges MRN: [id number], DOB/AGE: [birth year]   Admit date: 08/06/2021 Date of Consult: 08/06/2021  Primary Physician: Birdie Sons, MD Primary Cardiologist: Ida Rogue, MD Requesting Provider: Mora Bellman, MD  Patient Profile    Molly Hodges is a 87 y.o. female with a history of CAD s/p recent LAD/D1 stenting 08/02/2021, persistent Afib on amio/warfarin, moderate mitral stenosis, moderate mitral regurgitation, HTN, HL, COPD (O2 @ HS), prior PNA, carotid dzs s/p R carotid stenting, Ao atherosclerosis, chronic dizziness and glaucoma, who is being seen today for the evaluation of dyspnea and elevated HsTrop at the request of Dr. Blaine Hamper.  Past Medical History   Past Medical History:  Diagnosis Date   Aortic atherosclerosis ([place])    a. 05/2021 TEE: GrIII atheroma plaque involving the asc, transverse, and desc Ao.   Asthma    CAD (coronary artery disease)    a. 04/2021 Cath: LM nl, LAD 85p/m, D1 80, RI nl, LCX nl, RCA nl; b. 07/2021 PCI: pLAD (2.75x26 Onyx Frontier DES), D1 (2.5x22 Onyx Frontier DES).   Carotid artery disease ([place])    a. s/p R carotid stenting (69mm x 33mm x 4cm long Exact stent); b. 06/2021 U/S: RICA 17-49%, LICA 44-96%.   Community acquired pneumonia    Essential hypertension    Glaucoma    Hyperlipidemia    Mitral regurgitation    a. TTE 08/2015: EF 60-65%, normal wall motion, mild MR, mildly dilated left atrium measuring 40 mm, RVSF normal, PASP normal; b. 05/2021 TEE: Moderate MR.   Mitral stenosis    a. 05/2021 L/RHC: Sev MS w/ mean grad 13-72mmHg and MV area of 0.5-06.cm^2; b. 05/2021 TEE: EF 55-60%, no rwma, nl RV fxn, mod MR, mod MS (MV area by P1/2t: 1.61 cm^2 w/ mean grad of 69mmHg).   Persistent atrial fibrillation ([place]) 09/17/2015   a. s/p DCCV 11/15/2015; b. CHADS2VASc => 4 (HTN, age x 2, female)--> warfarin; c. 06/2021 recurrent afib-->amio added.    Past Surgical History:  Procedure Laterality Date    ABDOMINAL HYSTERECTOMY  1987   due to heavy bleeding   APPENDECTOMY     CARDIAC CATHETERIZATION     CAROTID PTA/STENT INTERVENTION Right 08/15/2020   Procedure: CAROTID PTA/STENT INTERVENTION;  Surgeon: Algernon Huxley, MD;  Location: [facility];  Service: Cardiovascular;  Laterality: Right;   CATARACT EXTRACTION     CORONARY STENT INTERVENTION N/A 08/02/2021   Procedure: CORONARY STENT INTERVENTION;  Surgeon: Nelva Bush, MD;  Location: [facility];  Service: Cardiovascular;  Laterality: N/A;   ELECTROPHYSIOLOGIC STUDY N/A 11/15/2015   Procedure: CARDIOVERSION;  Surgeon: Minna Merritts, MD;  Location: [facility];  Service: Cardiovascular;  Laterality: N/A;   INTRAVASCULAR IMAGING/OCT N/A 08/02/2021   Procedure: INTRAVASCULAR IMAGING/OCT;  Surgeon: Nelva Bush, MD;  Location: [facility];  Service: Cardiovascular;  Laterality: N/A;   LEFT HEART CATH AND CORONARY ANGIOGRAPHY N/A 08/02/2021   Procedure: LEFT HEART CATH AND CORONARY ANGIOGRAPHY;  Surgeon: Nelva Bush, MD;  Location: [facility];  Service: Cardiovascular;  Laterality: N/A;   RIGHT/LEFT HEART CATH AND CORONARY ANGIOGRAPHY N/A 05/17/2021   Procedure: RIGHT/LEFT HEART CATH AND CORONARY ANGIOGRAPHY;  Surgeon: Nelva Bush, MD;  Location: [facility];  Service: Cardiovascular;  Laterality: N/A;   TEE WITHOUT CARDIOVERSION N/A 06/13/2021   Procedure: TRANSESOPHAGEAL ECHOCARDIOGRAM (TEE);  Surgeon: Minna Merritts, MD;  Location: [facility];  Service: Cardiovascular;  Laterality: N/A;  Allergies  Allergies  Allergen Reactions   Shellfish Allergy Anaphylaxis    Other reaction(s): Hallucination   Azithromycin Other (See Comments)    Extreme burning sensation at IV site    Tamiflu [Oseltamivir Phosphate] Other (See Comments)    Reaction:  Hallucinations     Albuterol Palpitations    Heart racing.     History of Present Illness    87 y.o. female with a history of CAD  s/p recent LAD/D1 stenting 08/02/2021, persistent Afib on amio/warfarin, moderate mitral stenosis, moderate mitral regurgitation, HTN, HL, COPD (O2 @ HS), prior PNA, carotid dzs s/p R carotid stenting, Ao atherosclerosis, chronic dizziness and glaucoma.  Atrial fibrillation was diagnosed in January 2017 in the context of pneumonia.  Echo at that time showed normal LV function with moderately dilated left atrium.  She subsequently underwent cardioversion and was anticoagulated with Eliquis.  Over the past year, she has been dealing with chronic dizziness and underwent cardiac monitoring through her primary care provider's office in March 2022 which showed predominantly sinus rhythm and the average heart rate of 65 bpm with first-degree AV block and rare PACs and PVCs.  In the setting of dyspnea, she was referred to pulmonology in April 2022.  An echocardiogram showed an EF of 55 to 60% with grade 2 diastolic dysfunction, normal RV function, and moderate to severe mitral stenosis.  She followed up in cardiology clinic in September and given echo findings with dizziness and dyspnea, decision was made to pursue diagnostic catheterization, which showed an 80 to 90% proximal/mid LAD stenosis as well as an 80% stenosis in the first diagonal.  Left/right heart cath suggested severe mitral stenosis and at least moderate mitral regurgitation.  In the setting of significant valvular disease and coronary disease, she was referred to CT surgery with recommendation for TEE.  This was performed October 27 and showed an EF of 55 to 60% with moderate MR and moderate MS.  As result, she was subsequently seen in cardiology clinic and arrangements were made for PCI of the LAD/diagonal.  This was performed on December 16, with a drug-eluting stent placed in each vessel.  She tolerated the procedure well and was discharged home on December 17.  Following discharge on December 17, she felt well.  She notes that she was mostly taking it  easy and is not sure if she had any improvement in her baseline level of fatigue and dyspnea on exertion.  Beginning on Monday, December 19, she started to experience some dyspnea on exertion with intermittent wheezing.  She notes that she has a prior history of asthma though does not typically use inhalers at home.  She felt fatigued throughout the day and by the time she was ready for bed, she noted some worsening of dyspnea and wheezing.  Around 3 AM, her daughter gave her nebulizer treatment and patient noted just enough improvement to fall asleep for about 3 hours.  When she awoke later in the morning however, she noted recurrent dyspnea, prompting her to call EMS.  Following EMS arrival, she was given duo nebs with minimal relief and 125 mg of IV Solu-Medrol.  [facility] was [facility] ED where labs were notable for a BNP 555.3. HsTrop 1505  1284. D-dimer 12.17.  ECG showed rate controlled atrial fibrillation without acute ST or T changes.  CTA of the chest showed no PE.  Lower lobe mucous plugging was noted with peribronchial thickening, and patchy groundglass density - ?  Atelectasis versus  mild edema versus combination of the 2.  She was placed on heparin infusion and we have been asked to eval.  Currently, she denies chest pain or dyspnea.  She did experience tightness in her chest throughout the evening and early morning hours.  She notes that she never had chest pain/tightness prior to stenting.  Inpatient Medications     vitamin C  500 mg Oral Daily   aspirin EC  81 mg Oral Daily   calcium-vitamin D  1 tablet Oral Daily   cholecalciferol  1,000 Units Oral Daily   clopidogrel  75 mg Oral Daily   diltiazem  180 mg Oral Daily   dorzolamide  1 drop Both Eyes BID   ezetimibe  10 mg Oral Daily   ipratropium  0.5 mg Nebulization Q4H   isosorbide mononitrate  30 mg Oral Daily   latanoprost  1 drop Both Eyes QHS   magnesium oxide  400 mg Oral Daily   methylPREDNISolone (SOLU-MEDROL) injection  80  mg Intravenous Q24H   multivitamin-lutein  2 capsule Oral Daily   pregabalin  50 mg Oral BID   rosuvastatin  20 mg Oral Daily   vitamin E  200 Units Oral Daily    Family History    Family History  Problem Relation Age of Onset   CAD Mother    CAD Father    Cancer Son 1       lung cancer   She indicated that her mother is deceased. She indicated that her father is deceased. She indicated that both of her daughters are alive. She indicated that both of her sons are alive. She indicated that her other is alive.   Social History    Social History   Socioeconomic History   Marital status: Married    Spouse name: Not on file   Number of children: 4   Years of education: Not on file   Highest education level: 12th grade  Occupational History   Occupation: retired  Tobacco Use   Smoking status: Never   Smokeless tobacco: Never   Tobacco comments:    never   Media planner   Vaping Use: Never used  Substance and Sexual Activity   Alcohol use: No   Drug use: Never   Sexual activity: Not on file  Other Topics Concern   Not on file  Social History Narrative   Lives at home with Husband. Active and Independent at baseline.   Social Determinants of Health   Financial Resource Strain: Low Risk    Difficulty of Paying Living Expenses: Not hard at all  Food Insecurity: No Food Insecurity   Worried About Charity fundraiser in the Last Year: Never true   [place] in the Last Year: Never true  Transportation Needs: No Transportation Needs   Lack of Transportation (Medical): No   Lack of Transportation (Non-Medical): No  Physical Activity: Inactive   Days of Exercise per Week: 0 days   Minutes of Exercise per Session: 0 min  Stress: No Stress Concern Present   Feeling of Stress : Not at all  Social Connections: Moderately Isolated   Frequency of Communication with Friends and Family: More than three times a week   Frequency of Social Gatherings with Friends and Family:  More than three times a week   Attends Religious Services: Never   Marine scientist or Organizations: No   Attends Archivist Meetings: Never   Marital Status: Married  Intimate Partner Violence: Not At Risk   Fear of Current or Ex-Partner: No   Emotionally Abused: No   Physically Abused: No   Sexually Abused: No     Review of Systems    General:  No chills, fever, night sweats or weight changes.  Cardiovascular:  +++ chest tightness in the setting of dyspnea and wheezing, +++ dyspnea on exertion, no edema, orthopnea, palpitations, paroxysmal nocturnal dyspnea. Dermatological: No rash, lesions/masses Respiratory: No cough, +++ dyspnea, +++ wheezing Urologic: No hematuria, dysuria Abdominal:   No nausea, vomiting, diarrhea, bright red blood per rectum, melena, or hematemesis Neurologic:  No visual changes, wkns, changes in mental status. All other systems reviewed and are otherwise negative except as noted above.  Physical Exam    Blood pressure (!) 146/69, pulse 78, temperature 98.4 F (36.9 C), temperature source Oral, resp. rate 16, height 5\' 5"  (1.651 m), weight 58.5 kg, SpO2 98 %.  General: Pleasant, NAD Psych: Normal affect. Neuro: Alert and oriented X 3. Moves all extremities spontaneously. HEENT: Normal  Neck: Supple without bruits or JVD. Lungs:  Resp regular and unlabored, diminished breath sounds bilaterally. Heart: RRR no s3, s4, do not appreciate an MR/MS murmur.  Right radial catheterization site is without bleeding, bruit, or hematoma. Abdomen: Soft, non-tender, non-distended, BS + x 4.  Extremities: No clubbing, cyanosis or edema. DP/PT2+, Radials 2+ and equal bilaterally.  Labs    Cardiac Enzymes Recent Labs  Lab 08/06/21 0802 08/06/21 1030  TROPONINIHS 1,505* 1,284*      Lab Results  Component Value Date   WBC 9.3 08/06/2021   HGB 10.7 (L) 08/06/2021   HCT 33.7 (L) 08/06/2021   MCV 76.2 (L) 08/06/2021   PLT 206 08/06/2021     Recent Labs  Lab 08/06/21 0802  NA 132*  K 3.5  CL 97*  CO2 25  BUN 10  CREATININE 0.72  CALCIUM 8.4*  PROT 6.4*  BILITOT 0.9  ALKPHOS 87  ALT 55*  AST 54*  GLUCOSE 126*   Lab Results  Component Value Date   CHOL 119 09/17/2020   HDL 66 09/17/2020   LDLCALC 40 09/17/2020   TRIG 58 09/17/2020   Lab Results  Component Value Date   DDIMER 12.17 (H) 08/06/2021   Lab Results  Component Value Date   INR 1.1 08/06/2021   INR 1.2 08/03/2021   INR 1.2 08/02/2021      Radiology Studies    DG Chest 2 View  Result Date: 08/06/2021 CLINICAL DATA:  Shortness of breath, cough, fever EXAM: CHEST - 2 VIEW COMPARISON:  Chest radiograph 05/14/2020 FINDINGS: The heart is enlarged. The upper mediastinal contours are within normal limits. The lungs are hyperinflated consistent with COPD. Reticular opacities in the lateral right base are unchanged, likely reflecting scarring. Blunting of the costophrenic angles also likely reflects scar. There is no other focal airspace disease. There is no pleural effusion. There is no pneumothorax. There is no acute osseous abnormality. IMPRESSION: Cardiomegaly and scarring in the lateral right base, not significantly changed. No radiographic evidence of acute cardiopulmonary process. Electronically Signed   By: Valetta Mole M.D.   On: 08/06/2021 08:32   CT Angio Chest PE W and/or Wo Contrast  Result Date: 08/06/2021 CLINICAL DATA:  Pulmonary embolism (PE) suspected, high prob; shortness of breath, no fever EXAM: CT ANGIOGRAPHY CHEST WITH CONTRAST TECHNIQUE: Multidetector CT imaging of the chest was performed using the standard protocol during bolus administration of intravenous contrast. Multiplanar CT image reconstructions and  MIPs were obtained to evaluate the vascular anatomy. CONTRAST:  33mL OMNIPAQUE IOHEXOL 350 MG/ML SOLN COMPARISON:  None. FINDINGS: Cardiovascular: Satisfactory opacification of the pulmonary arteries to the segmental level. No  evidence of pulmonary embolism. Cardiomegaly. No pericardial effusion. Coronary artery calcification. Aortic atherosclerosis. Mediastinum/Nodes: Mildly enlarged mediastinal nodes. For example, pretracheal node measuring 1 cm short axis (series 5, image 116). Prevascular node measuring 1 cm short axis (image 142). Lungs/Pleura: Imaging during expiration. Peribronchial thickening. There is mucous plugging in the lower lobes. Patchy ground-glass density. Subsegmental atelectasis/scarring at the lung bases. Subpleural cystic change at the lung bases. Small right pleural effusion. No pneumothorax. Upper Abdomen: No acute abnormality. Musculoskeletal: Decreased osseous mineralization. No acute abnormality. Review of the MIP images confirms the above findings. IMPRESSION: No evidence of acute pulmonary embolism. Lower lobe mucous plugging. Peribronchial thickening, which is probably chronic. Patchy ground-glass density may reflect atelectasis, mild edema, or a combination. Cardiomegaly.  Coronary artery and aortic atherosclerosis. Electronically Signed   By: Macy Mis M.D.   On: 08/06/2021 09:44   ECG & Cardiac Imaging    Afib, 71 nonspecific ST/T changes. QTc 508 - personally reviewed.  Assessment & Plan    1.  Non-STEMI versus demand ischemia/coronary artery disease patient with history of CAD status post drug-eluting stent placement to the proximal LAD and diagonal similar 16 with subsequent discharge December 17.  She felt well over the weekend but began experiencing malaise, fatigue, and increasing dyspnea with associated wheezing on December 19.  This worsened overnight, prompting call to EMS and ED evaluation.  There, ECGs without acute ST or T changes.  Symptoms have improved somewhat with steroids and nebulizers.  Initial troponin was 1505 with follow-up of 1284.  CTA of the chest was negative for PE.  It is possible that troponin is downtrending from procedure last week however, this cannot be  improving.  Given worsening respiratory failure, chest tightness, and elevated troponin status post recent complex PCI, will plan on diagnostic catheterization in the a.m.  The patient understands that risks include but are not limited to stroke (1 in 1000), death (1 in 66), kidney failure [usually temporary] (1 in 500), bleeding (1 in 200), allergic reaction [possibly serious] (1 in 200), and agrees to proceed.  Hold warfarin continue aspirin, Plavix, heparin, nitrate, statin, and Zetia.  2.  Acute respiratory failure/acute heart failure with preserved ejection fraction: See 1.  Patient with a history of asthma and needed increasing wheezing and dyspnea throughout the day yesterday culminating progressive dyspnea overnight, prompting presentation.  She seems to have responded to steroids and nebulizers, and has also received a dose of intravenous Lasix in the setting of elevated BNP and question of edema on CT.  She does not appear to be significantly volume overloaded on examination.  Continue home dose of oral Lasix.  Follow volume status closely.  3.  Essential hypertension: Blood pressure currently elevated in the emergency department.  Resume home medications at time.  4.  Hyperlipidemia: LDL of 40 in January.  She is on statin and Zetia therapy.  5.  Persistent atrial fibrillation: Ongoing since at least November, potentially longer.  She is well rate controlled on diltiazem therapy.  She has been chronic anticoagulated with warfarin and most recently, has been receiving Lovenox bridge.  Previous home dose of amiodarone has been on hold in the setting of break in oral anticoagulation with recent procedure.  Continue to hold amiodarone with plan to resume at outpatient follow-up once INR therapeutic,  likely to be followed by cardioversion after 4 weeks of therapeutic INRs.  Will bridge with heparin while she is hospitalized.  6.  Moderate mitral regurgitation/moderate mitral stenosis: Status post  TEE in October showing moderate mitral valve disease.  Outpatient surveillance echo in 2023.  7.  Carotid arterial disease: Status post prior right carotid stenting with patent stent on ultrasound earlier this year.  She is followed by vascular surgery.  Continue statin therapy.  8.  Microcytic anemia: H&H down slightly since discharge.  Following heparin.  Signed, Murray Hodgkins, NP 08/06/2021, 12:44 PM  For questions or updates, please contact   Please consult [email or web address] for contact info under Cardiology/STEMI.

## 2021-08-06 NOTE — ED Notes (Signed)
Pt ambulated with one staff assist to restroom. Pt ambulated without difficulty. Pt returned safely to bedside.

## 2021-08-06 NOTE — Progress Notes (Signed)
Instructed pt on how to use incentive spirometry. Able to reach 500-750. Pt tolerated the lung exercise well.

## 2021-08-06 NOTE — H&P (Addendum)
History and Physical    Molly Hodges ZSW:[id number] DOB: 1932/05/24 DOA: 08/06/2021  Referring MD/NP/PA:   PCP: Birdie Sons, MD   Patient coming from:  The patient is coming from home.        Chief Complaint: Shortness of breath  HPI: Molly Hodges is a 87 y.o. female with medical history significant of COPD, asthma, hypertension, hyperlipidemia, hypothyroidism, atrial fibrillation on Coumadin, mitral valve prolapse, carotid artery stenosis, anemia, PVD, CAD, dCHF, who presents with shortness breath.  Patient has short of breath for several days, which has been progressively worsening.  Patient has cough with little mucus production.  She has wheezing.  Denies fever or chills.  No chest pain.  Patient does not have nausea vomiting, diarrhea or abdominal pain.  No symptoms of UTI.  ED Course: pt was found to have WBC 9.3, troponin level 1505, D-dimer 12.17, negative COVID PCR, electrolytes renal function okay, temperature normal, blood pressure 141/74, heart rate 77, RR 18, oxygen saturation 96% on room air.  Chest x-ray showed cardiomegaly, no effusion.  CT angiogram is negative for PE.  Patient is admitted to progressive bed as inpatient.  Dr. Fletcher Anon of cardiology is consulted.  CTA of chest: No evidence of acute pulmonary embolism.   Lower lobe mucous plugging. Peribronchial thickening, which is probably chronic. Patchy ground-glass density may reflect atelectasis, mild edema, or a combination.   Cardiomegaly.  Coronary artery and aortic atherosclerosis.   Review of Systems:   General: no fevers, chills, no body weight gain,  has fatigue HEENT: no blurry vision, hearing changes or sore throat Respiratory: has dyspnea, coughing, wheezing CV: no chest pain, no palpitations GI: no nausea, vomiting, abdominal pain, diarrhea, constipation GU: no dysuria, burning on urination, increased urinary frequency, hematuria  Ext: no leg edema Neuro: no unilateral weakness,  numbness, or tingling, no vision change or hearing loss Skin: no rash, no skin tear. MSK: No muscle spasm, no deformity, no limitation of range of movement in spin Heme: No easy bruising.  Travel history: No recent long distant travel.  Allergy:  Allergies  Allergen Reactions   Shellfish Allergy Anaphylaxis    Other reaction(s): Hallucination   Azithromycin Other (See Comments)    Extreme burning sensation at IV site    Tamiflu [Oseltamivir Phosphate] Other (See Comments)    Reaction:  Hallucinations     Albuterol Palpitations    Heart racing.     Past Medical History:  Diagnosis Date   Aortic atherosclerosis ([place])    a. 05/2021 TEE: GrIII atheroma plaque involving the asc, transverse, and desc Ao.   Asthma    CAD (coronary artery disease)    a. 04/2021 Cath: LM nl, LAD 85p/m, D1 80, RI nl, LCX nl, RCA nl; b. 07/2021 PCI: pLAD (2.75x26 Onyx Frontier DES), D1 (2.5x22 Onyx Frontier DES).   Carotid artery disease ([place])    a. s/p R carotid stenting (29mm x 68mm x 4cm long Exact stent); b. 06/2021 U/S: RICA 32-20%, LICA 25-42%.   Community acquired pneumonia    Essential hypertension    Glaucoma    Hyperlipidemia    Mitral regurgitation    a. TTE 08/2015: EF 60-65%, normal wall motion, mild MR, mildly dilated left atrium measuring 40 mm, RVSF normal, PASP normal; b. 05/2021 TEE: Moderate MR.   Mitral stenosis    a. 05/2021 L/RHC: Sev MS w/ mean grad 13-2mmHg and MV area of 0.5-06.cm^2; b. 05/2021 TEE: EF 55-60%, no rwma, nl RV fxn, mod  MR, mod MS (MV area by P1/2t: 1.61 cm^2 w/ mean grad of 42mmHg).   Persistent atrial fibrillation ([place]) 09/17/2015   a. s/p DCCV 11/15/2015; b. CHADS2VASc => 4 (HTN, age x 2, female)--> warfarin; c. 06/2021 recurrent afib-->amio added.    Past Surgical History:  Procedure Laterality Date   ABDOMINAL HYSTERECTOMY  1987   due to heavy bleeding   APPENDECTOMY     CARDIAC CATHETERIZATION     CAROTID PTA/STENT INTERVENTION Right 08/15/2020    Procedure: CAROTID PTA/STENT INTERVENTION;  Surgeon: Algernon Huxley, MD;  Location: [place] [facility];  Service: Cardiovascular;  Laterality: Right;   CATARACT EXTRACTION     CORONARY STENT INTERVENTION N/A 08/02/2021   Procedure: CORONARY STENT INTERVENTION;  Surgeon: Nelva Bush, MD;  Location: [facility];  Service: Cardiovascular;  Laterality: N/A;   ELECTROPHYSIOLOGIC STUDY N/A 11/15/2015   Procedure: CARDIOVERSION;  Surgeon: Minna Merritts, MD;  Location: [facility];  Service: Cardiovascular;  Laterality: N/A;   INTRAVASCULAR IMAGING/OCT N/A 08/02/2021   Procedure: INTRAVASCULAR IMAGING/OCT;  Surgeon: Nelva Bush, MD;  Location: [place] [facility];  Service: Cardiovascular;  Laterality: N/A;   LEFT HEART CATH AND CORONARY ANGIOGRAPHY N/A 08/02/2021   Procedure: LEFT HEART CATH AND CORONARY ANGIOGRAPHY;  Surgeon: Nelva Bush, MD;  Location: [place] [facility];  Service: Cardiovascular;  Laterality: N/A;   RIGHT/LEFT HEART CATH AND CORONARY ANGIOGRAPHY N/A 05/17/2021   Procedure: RIGHT/LEFT HEART CATH AND CORONARY ANGIOGRAPHY;  Surgeon: Nelva Bush, MD;  Location: [facility] [facility];  Service: Cardiovascular;  Laterality: N/A;   TEE WITHOUT CARDIOVERSION N/A 06/13/2021   Procedure: TRANSESOPHAGEAL ECHOCARDIOGRAM (TEE);  Surgeon: Minna Merritts, MD;  Location: [facility];  Service: Cardiovascular;  Laterality: N/A;    Social History:  reports that she has never smoked. She has never used smokeless tobacco. She reports that she does not drink alcohol and does not use drugs.  Family History:  Family History  Problem Relation Age of Onset   CAD Mother    CAD Father    Cancer Son 18       lung cancer     Prior to Admission medications   Medication Sig Start Date End Date Taking? Authorizing Provider  albuterol (VENTOLIN HFA) 108 (90 Base) MCG/ACT inhaler Inhale 2 puffs into the lungs every 6 (six) hours as needed for wheezing or shortness of breath. 05/14/20   Yes Naaman Plummer, MD  aspirin EC 81 MG tablet Take 81 mg by mouth daily. Swallow whole.   Yes [provider]  Calcium Carbonate-Vitamin D 600-400 MG-UNIT tablet Take 1 tablet by mouth daily.   Yes [provider]  cholecalciferol (VITAMIN D) 1000 UNITS tablet Take 1,000 Units by mouth daily.   Yes [provider]  clopidogrel (PLAVIX) 75 MG tablet Take 1 tablet (75 mg total) by mouth daily. 08/03/21  Yes Strader, [place] M, PA-C  diltiazem (CARDIZEM CD) 180 MG 24 hr capsule TAKE 1 CAPSULE EVERY DAY 06/07/21  Yes Birdie Sons, MD  dorzolamide (TRUSOPT) 2 % ophthalmic solution Place 1 drop into both eyes 2 (two) times daily.    Yes [provider]  enoxaparin (LOVENOX) 60 MG/0.6ML injection Inject 0.6 mLs (60 mg total) into the skin every 12 (twelve) hours for 5 days. 08/03/21 08/08/21 Yes Strader, Fransisco Hertz, PA-C  ezetimibe (ZETIA) 10 MG tablet TAKE 1 TABLET BY MOUTH DAILY. 06/07/21  Yes Minna Merritts, MD  furosemide (LASIX) 40 MG tablet Take 1 tablet (  40 mg total) by mouth daily. 05/17/21 05/17/22 Yes End, Harrell Gave, MD  ipratropium-albuterol (DUONEB) 0.5-2.5 (3) MG/3ML SOLN Take 3 mLs by nebulization every 4 (four) hours as needed. 04/29/21  Yes Birdie Sons, MD  isosorbide mononitrate (IMDUR) 30 MG 24 hr tablet Take 1 tablet (30 mg total) by mouth daily. 07/22/21 10/20/21 Yes Gollan, Kathlene November, MD  latanoprost (XALATAN) 0.005 % ophthalmic solution Place 1 drop into both eyes at bedtime.    Yes [provider]  magnesium oxide (MAG-OX) 400 MG tablet Take 400 mg by mouth daily.   Yes [provider]  Multiple Vitamins-Minerals (PRESERVISION AREDS 2+MULTI VIT) CAPS Take 2 capsules by mouth daily.   Yes [provider]  potassium chloride (KLOR-CON) 10 MEQ tablet TAKE 1 TABLET BY MOUTH DAILY 05/17/21  Yes Gollan, Kathlene November, MD  pregabalin (LYRICA) 50 MG capsule TAKE 1 CAPSULE BY MOUTH 2 TIMES DAILY 01/19/21  Yes Birdie Sons,  MD  rosuvastatin (CRESTOR) 20 MG tablet TAKE 1 TABLET BY MOUTH DAILY. 06/07/21  Yes Gollan, Kathlene November, MD  vitamin C (ASCORBIC ACID) 500 MG tablet Take 500 mg by mouth daily.   Yes [provider]  vitamin E 100 UNIT capsule Take 200 Units by mouth daily.   Yes [provider]  warfarin (COUMADIN) 2.5 MG tablet Take 1 tablet (2.5 mg total) by mouth daily. Take as directed by the anti-coag clinic 07/10/21  Yes Gollan, Kathlene November, MD  nitroGLYCERIN (NITROSTAT) 0.4 MG SL tablet Place 1 tablet (0.4 mg total) under the tongue every 5 (five) minutes as needed (for chest pain or shortness of breath). 05/17/21 05/17/22  Nelva Bush, MD    Physical Exam: Vitals:   08/06/21 1300 08/06/21 1630 08/06/21 1706 08/06/21 1831  BP: (!) 133/59 131/60 (!) 150/70 124/61  Pulse: 81 87 85 82  Resp: 15 19    Temp:   (!) 97.1 F (36.2 C) 98 F (36.7 C)  TempSrc:    Oral  SpO2: 95% 97% 99% 98%  Weight:   58.5 kg   Height:       General: Not in acute distress HEENT:       Eyes: PERRL, EOMI, no scleral icterus.       ENT: No discharge from the ears and nose, no pharynx injection, no tonsillar enlargement.        Neck: No JVD, no bruit, no mass felt. Heme: No neck lymph node enlargement. Cardiac: S1/S2, RRR, No gallops or rubs. Respiratory: has wheezing bilaterally GI: Soft, nondistended, nontender, no rebound pain, no organomegaly, BS present. GU: No hematuria Ext: No pitting leg edema bilaterally. 1+DP/PT pulse bilaterally. Musculoskeletal: No joint deformities, No joint redness or warmth, no limitation of ROM in spin. Skin: No rashes.  Neuro: Alert, oriented X3, cranial nerves II-XII grossly intact, moves all extremities normally.   Psych: Patient is not psychotic, no suicidal or hemocidal ideation.  Labs on Admission: I have personally reviewed following labs and imaging studies  CBC: Recent Labs  Lab 08/03/21 0144 08/06/21 0802  WBC 10.2 9.3  NEUTROABS  --  6.9  HGB 11.3*  10.7*  HCT 35.1* 33.7*  MCV 76.3* 76.2*  PLT 232 660   Basic Metabolic Panel: Recent Labs  Lab 08/03/21 0144 08/06/21 0802  NA 135 132*  K 3.1* 3.5  CL 100 97*  CO2 25 25  GLUCOSE 110* 126*  BUN 9 10  CREATININE 0.76 0.72  CALCIUM 8.4* 8.4*   GFR: Estimated Creatinine Clearance:  44.6 mL/min (by C-G formula based on SCr of 0.72 mg/dL). Liver Function Tests: Recent Labs  Lab 08/06/21 0802  AST 54*  ALT 55*  ALKPHOS 87  BILITOT 0.9  PROT 6.4*  ALBUMIN 3.3*   No results for input(s): LIPASE, AMYLASE in the last 168 hours. No results for input(s): AMMONIA in the last 168 hours. Coagulation Profile: Recent Labs  Lab 08/02/21 0703 08/03/21 0944 08/06/21 0909  INR 1.2 1.2 1.1   Cardiac Enzymes: No results for input(s): CKTOTAL, CKMB, CKMBINDEX, TROPONINI in the last 168 hours. BNP (last 3 results) No results for input(s): PROBNP in the last 8760 hours. HbA1C: No results for input(s): HGBA1C in the last 72 hours. CBG: No results for input(s): GLUCAP in the last 168 hours. Lipid Profile: No results for input(s): CHOL, HDL, LDLCALC, TRIG, CHOLHDL, LDLDIRECT in the last 72 hours. Thyroid Function Tests: No results for input(s): TSH, T4TOTAL, FREET4, T3FREE, THYROIDAB in the last 72 hours. Anemia Panel: No results for input(s): VITAMINB12, FOLATE, FERRITIN, TIBC, IRON, RETICCTPCT in the last 72 hours. Urine analysis:    Component Value Date/Time   COLORURINE AMBER (A) 09/17/2015 1841   APPEARANCEUR HAZY (A) 09/17/2015 1841   LABSPEC 1.029 09/17/2015 1841   PHURINE 5.0 09/17/2015 1841   GLUCOSEU NEGATIVE 09/17/2015 1841   HGBUR 2+ (A) 09/17/2015 1841   BILIRUBINUR NEGATIVE 09/17/2015 1841   KETONESUR NEGATIVE 09/17/2015 1841   PROTEINUR 100 (A) 09/17/2015 1841   NITRITE NEGATIVE 09/17/2015 1841   LEUKOCYTESUR 1+ (A) 09/17/2015 1841   Sepsis Labs: @LABRCNTIP (procalcitonin:4,lacticidven:4) ) Recent Results (from the past 240 hour(s))  Resp Panel by RT-PCR  (Flu A&B, Covid) Nasopharyngeal Swab     Status: None   Collection Time: 08/06/21  9:09 AM   Specimen: Nasopharyngeal Swab; Nasopharyngeal(NP) swabs in vial transport medium  Result Value Ref Range Status   SARS Coronavirus 2 by RT PCR NEGATIVE NEGATIVE Final    Comment: (NOTE) SARS-CoV-2 target nucleic acids are NOT DETECTED.  The SARS-CoV-2 RNA is generally detectable in upper respiratory specimens during the acute phase of infection. The lowest concentration of SARS-CoV-2 viral copies this assay can detect is 138 copies/mL. A negative result does not preclude SARS-Cov-2 infection and should not be used as the sole basis for treatment or other patient management decisions. A negative result may occur with  improper specimen collection/handling, submission of specimen other than nasopharyngeal swab, presence of viral mutation(s) within the areas targeted by this assay, and inadequate number of viral copies(<138 copies/mL). A negative result must be combined with clinical observations, patient history, and epidemiological information. The expected result is Negative.  Fact Sheet for Patients:  EntrepreneurPulse.com.au  Fact Sheet for Healthcare Providers:  IncredibleEmployment.be  This test is no t yet approved or cleared by the [place] FDA and  has been authorized for detection and/or diagnosis of SARS-CoV-2 by FDA under an Emergency Use Authorization (EUA). This EUA will remain  in effect (meaning this test can be used) for the duration of the COVID-19 declaration under Section 564(b)(1) of the Act, 21 U.S.C.section 360bbb-3(b)(1), unless the authorization is terminated  or revoked sooner.       Influenza A by PCR NEGATIVE NEGATIVE Final   Influenza B by PCR NEGATIVE NEGATIVE Final    Comment: (NOTE) The Xpert Xpress SARS-CoV-2/FLU/RSV plus assay is intended as an aid in the diagnosis of influenza from Nasopharyngeal swab specimens  and should not be used as a sole basis for treatment. Nasal washings and aspirates are unacceptable  for Xpert Xpress SARS-CoV-2/FLU/RSV testing.  Fact Sheet for Patients: EntrepreneurPulse.com.au  Fact Sheet for Healthcare Providers: IncredibleEmployment.be  This test is not yet approved or cleared by the [place] FDA and has been authorized for detection and/or diagnosis of SARS-CoV-2 by FDA under an Emergency Use Authorization (EUA). This EUA will remain in effect (meaning this test can be used) for the duration of the COVID-19 declaration under Section 564(b)(1) of the Act, 21 U.S.C. section 360bbb-3(b)(1), unless the authorization is terminated or revoked.  Performed at [facility], [address] [place],  [address]      Radiological Exams on Admission: DG Chest 2 View  Result Date: 08/06/2021 CLINICAL DATA:  Shortness of breath, cough, fever EXAM: CHEST - 2 VIEW COMPARISON:  Chest radiograph 05/14/2020 FINDINGS: The heart is enlarged. The upper mediastinal contours are within normal limits. The lungs are hyperinflated consistent with COPD. Reticular opacities in the lateral right base are unchanged, likely reflecting scarring. Blunting of the costophrenic angles also likely reflects scar. There is no other focal airspace disease. There is no pleural effusion. There is no pneumothorax. There is no acute osseous abnormality. IMPRESSION: Cardiomegaly and scarring in the lateral right base, not significantly changed. No radiographic evidence of acute cardiopulmonary process. Electronically Signed   By: Valetta Mole M.D.   On: 08/06/2021 08:32   CT Angio Chest PE W and/or Wo Contrast  Result Date: 08/06/2021 CLINICAL DATA:  Pulmonary embolism (PE) suspected, high prob; shortness of breath, no fever EXAM: CT ANGIOGRAPHY CHEST WITH CONTRAST TECHNIQUE: Multidetector CT imaging of the chest was performed using the standard  protocol during bolus administration of intravenous contrast. Multiplanar CT image reconstructions and MIPs were obtained to evaluate the vascular anatomy. CONTRAST:  18mL OMNIPAQUE IOHEXOL 350 MG/ML SOLN COMPARISON:  None. FINDINGS: Cardiovascular: Satisfactory opacification of the pulmonary arteries to the segmental level. No evidence of pulmonary embolism. Cardiomegaly. No pericardial effusion. Coronary artery calcification. Aortic atherosclerosis. Mediastinum/Nodes: Mildly enlarged mediastinal nodes. For example, pretracheal node measuring 1 cm short axis (series 5, image 116). Prevascular node measuring 1 cm short axis (image 142). Lungs/Pleura: Imaging during expiration. Peribronchial thickening. There is mucous plugging in the lower lobes. Patchy ground-glass density. Subsegmental atelectasis/scarring at the lung bases. Subpleural cystic change at the lung bases. Small right pleural effusion. No pneumothorax. Upper Abdomen: No acute abnormality. Musculoskeletal: Decreased osseous mineralization. No acute abnormality. Review of the MIP images confirms the above findings. IMPRESSION: No evidence of acute pulmonary embolism. Lower lobe mucous plugging. Peribronchial thickening, which is probably chronic. Patchy ground-glass density may reflect atelectasis, mild edema, or a combination. Cardiomegaly.  Coronary artery and aortic atherosclerosis. Electronically Signed   By: Macy Mis M.D.   On: 08/06/2021 09:44     EKG: I have personally reviewed.  Atrial fibrillation, QTC 508, early R wave progression, low voltage, mild T wave inversion in V1-V2, and V4.  Assessment/Plan Principal Problem:   NSTEMI (non-ST elevated myocardial infarction) ([place]) Active Problems:   Hypertension   Asthma exacerbation   Paroxysmal atrial fibrillation ([facility])   Hypothyroidism   Hyperlipidemia   PAD (peripheral artery disease) ([facility])   COPD exacerbation ([facility])   CAD (coronary artery disease)   Abnormal LFTs   Chronic  diastolic CHF (congestive heart failure) ([facility])   NSTEMI and hx of CAD: s/p of stent. Trop  1505. No CP. Consulted Dr. Fletcher Anon of card.  - admit to progressive unit as inpatient - IV heparin - Trend Trop - prn Nitroglycerin, and aspirin, Crestor,  Zetia, plavix  - Risk factor stratification: will check FLP and A1C   Asthma exacerbation and  COPD exacerbation ([place]): Patient does not have significant sputum production.  No infiltration on chest x-ray.  Will hold off antibiotics. -Bronchodilators -Solu-Medrol 40 mg IV bid -Mucinex for cough  -Incentive spirometry  Hypertension -IV hydralazine as needed -Cardizem  Paroxysmal atrial fibrillation (HCC) -Cardizem -Hold Coumadin since patient is on IV heparin  Hypothyroidism -Synthroid  Hyperlipidemia -Crestor, Zetia  PAD (peripheral artery disease) (HCC) -Plavix  Abnormal LFTs: Mild.  AST 53, ALT 55, total bilirubin 0.9, ALP 87 -Avoid using Tylenol  Chronic diastolic CHF (congestive heart failure) ([place]): 2D echo on 06/13/2021 showed EF of 55-80% with grade 3 diastolic dysfunction.  Patient has elevated BNP 555, but no JVD or leg edema.  Chest x-ray is negative for edema.  Does not seem to have CHF exacerbation -Continue home Lasix         DVT ppx: on IV Heparin    Code Status: Partial code (I discussed with the patient and explained the meaning of CODE STATUS, patient wants to be partial code, OK for CPR, but no intubation). Family Communication: I called her daughter Disposition Plan:  Anticipate discharge back to previous environment Consults called:   Admission status and Level of care: Progressive:    as inpt            Status is: Inpatient  Remains inpatient appropriate because: Patient has multiple comorbidities, now presents with non-STEMI, exacerbation of asthma and COPD. Her presentation is highly complicated patient at high risk of deteriorating.  Need to be treated in the hospital for at least 2  days.         Date of Service 08/06/2021    Ivor Costa [facility]   If 7PM-7AM, please contact night-coverage [email or web address] 08/06/2021, 7:10 PM

## 2021-08-06 NOTE — Consult Note (Addendum)
Cardiology Consult    Patient ID: Molly Hodges MRN: [id number], DOB/AGE: [birth year]   Admit date: 08/06/2021 Date of Consult: 08/06/2021  Primary Physician: Birdie Sons, MD Primary Cardiologist: Ida Rogue, MD Requesting Provider: Mora Bellman, MD  Patient Profile    Molly Hodges is a 87 y.o. female with a history of CAD s/p recent LAD/D1 stenting 08/02/2021, persistent Afib on amio/warfarin, moderate mitral stenosis, moderate mitral regurgitation, HTN, HL, COPD (O2 @ HS), prior PNA, carotid dzs s/p R carotid stenting, Ao atherosclerosis, chronic dizziness and glaucoma, who is being seen today for the evaluation of dyspnea and elevated HsTrop at the request of Dr. Blaine Hamper.  Past Medical History   Past Medical History:  Diagnosis Date   Aortic atherosclerosis ([place])    a. 05/2021 TEE: GrIII atheroma plaque involving the asc, transverse, and desc Ao.   Asthma    CAD (coronary artery disease)    a. 04/2021 Cath: LM nl, LAD 85p/m, D1 80, RI nl, LCX nl, RCA nl; b. 07/2021 PCI: pLAD (2.75x26 Onyx Frontier DES), D1 (2.5x22 Onyx Frontier DES).   Carotid artery disease ([place])    a. s/p R carotid stenting (44mm x 9mm x 4cm long Exact stent); b. 06/2021 U/S: RICA 51-88%, LICA 41-66%.   Community acquired pneumonia    Essential hypertension    Glaucoma    Hyperlipidemia    Mitral regurgitation    a. TTE 08/2015: EF 60-65%, normal wall motion, mild MR, mildly dilated left atrium measuring 40 mm, RVSF normal, PASP normal; b. 05/2021 TEE: Moderate MR.   Mitral stenosis    a. 05/2021 L/RHC: Sev MS w/ mean grad 13-59mmHg and MV area of 0.5-06.cm^2; b. 05/2021 TEE: EF 55-60%, no rwma, nl RV fxn, mod MR, mod MS (MV area by P1/2t: 1.61 cm^2 w/ mean grad of 34mmHg).   Persistent atrial fibrillation ([place]) 09/17/2015   a. s/p DCCV 11/15/2015; b. CHADS2VASc => 4 (HTN, age x 2, female)--> warfarin; c. 06/2021 recurrent afib-->amio added.    Past Surgical History:  Procedure Laterality Date    ABDOMINAL HYSTERECTOMY  1987   due to heavy bleeding   APPENDECTOMY     CARDIAC CATHETERIZATION     CAROTID PTA/STENT INTERVENTION Right 08/15/2020   Procedure: CAROTID PTA/STENT INTERVENTION;  Surgeon: Algernon Huxley, MD;  Location: [facility];  Service: Cardiovascular;  Laterality: Right;   CATARACT EXTRACTION     CORONARY STENT INTERVENTION N/A 08/02/2021   Procedure: CORONARY STENT INTERVENTION;  Surgeon: Nelva Bush, MD;  Location: [facility];  Service: Cardiovascular;  Laterality: N/A;   ELECTROPHYSIOLOGIC STUDY N/A 11/15/2015   Procedure: CARDIOVERSION;  Surgeon: Minna Merritts, MD;  Location: [facility];  Service: Cardiovascular;  Laterality: N/A;   INTRAVASCULAR IMAGING/OCT N/A 08/02/2021   Procedure: INTRAVASCULAR IMAGING/OCT;  Surgeon: Nelva Bush, MD;  Location: [place] [facility];  Service: Cardiovascular;  Laterality: N/A;   LEFT HEART CATH AND CORONARY ANGIOGRAPHY N/A 08/02/2021   Procedure: LEFT HEART CATH AND CORONARY ANGIOGRAPHY;  Surgeon: Nelva Bush, MD;  Location: [facility];  Service: Cardiovascular;  Laterality: N/A;   RIGHT/LEFT HEART CATH AND CORONARY ANGIOGRAPHY N/A 05/17/2021   Procedure: RIGHT/LEFT HEART CATH AND CORONARY ANGIOGRAPHY;  Surgeon: Nelva Bush, MD;  Location: [place] [facility];  Service: Cardiovascular;  Laterality: N/A;   TEE WITHOUT CARDIOVERSION N/A 06/13/2021   Procedure: TRANSESOPHAGEAL ECHOCARDIOGRAM (TEE);  Surgeon: Minna Merritts, MD;  Location: [facility];  Service: Cardiovascular;  Laterality: N/A;  Allergies  Allergies  Allergen Reactions   Shellfish Allergy Anaphylaxis    Other reaction(s): Hallucination   Azithromycin Other (See Comments)    Extreme burning sensation at IV site    Tamiflu [Oseltamivir Phosphate] Other (See Comments)    Reaction:  Hallucinations     Albuterol Palpitations    Heart racing.     History of Present Illness    87 y.o. female with a history of CAD  s/p recent LAD/D1 stenting 08/02/2021, persistent Afib on amio/warfarin, moderate mitral stenosis, moderate mitral regurgitation, HTN, HL, COPD (O2 @ HS), prior PNA, carotid dzs s/p R carotid stenting, Ao atherosclerosis, chronic dizziness and glaucoma.  Atrial fibrillation was diagnosed in January 2017 in the context of pneumonia.  Echo at that time showed normal LV function with moderately dilated left atrium.  She subsequently underwent cardioversion and was anticoagulated with Eliquis.  Over the past year, she has been dealing with chronic dizziness and underwent cardiac monitoring through her primary care provider's office in March 2022 which showed predominantly sinus rhythm and the average heart rate of 65 bpm with first-degree AV block and rare PACs and PVCs.  In the setting of dyspnea, she was referred to pulmonology in April 2022.  An echocardiogram showed an EF of 55 to 60% with grade 2 diastolic dysfunction, normal RV function, and moderate to severe mitral stenosis.  She followed up in cardiology clinic in September and given echo findings with dizziness and dyspnea, decision was made to pursue diagnostic catheterization, which showed an 80 to 90% proximal/mid LAD stenosis as well as an 80% stenosis in the first diagonal.  Left/right heart cath suggested severe mitral stenosis and at least moderate mitral regurgitation.  In the setting of significant valvular disease and coronary disease, she was referred to CT surgery with recommendation for TEE.  This was performed October 27 and showed an EF of 55 to 60% with moderate MR and moderate MS.  As result, she was subsequently seen in cardiology clinic and arrangements were made for PCI of the LAD/diagonal.  This was performed on December 16, with a drug-eluting stent placed in each vessel.  She tolerated the procedure well and was discharged home on December 17.  Following discharge on December 17, she felt well.  She notes that she was mostly taking it  easy and is not sure if she had any improvement in her baseline level of fatigue and dyspnea on exertion.  Beginning on Monday, December 19, she started to experience some dyspnea on exertion with intermittent wheezing.  She notes that she has a prior history of asthma though does not typically use inhalers at home.  She felt fatigued throughout the day and by the time she was ready for bed, she noted some worsening of dyspnea and wheezing.  Around 3 AM, her daughter gave her nebulizer treatment and patient noted just enough improvement to fall asleep for about 3 hours.  When she awoke later in the morning however, she noted recurrent dyspnea, prompting her to call EMS.  Following EMS arrival, she was given duo nebs with minimal relief and 125 mg of IV Solu-Medrol.  She was taken to the [facility] ED where labs were notable for a BNP 555.3. HsTrop 1505  1284. D-dimer 12.17.  ECG showed rate controlled atrial fibrillation without acute ST or T changes.  CTA of the chest showed no PE.  Lower lobe mucous plugging was noted with peribronchial thickening, and patchy groundglass density - ?  Atelectasis versus  mild edema versus combination of the 2.  She was placed on heparin infusion and we have been asked to eval.  Currently, she denies chest pain or dyspnea.  She did experience tightness in her chest throughout the evening and early morning hours.  She notes that she never had chest pain/tightness prior to stenting.  Inpatient Medications     vitamin C  500 mg Oral Daily   aspirin EC  81 mg Oral Daily   calcium-vitamin D  1 tablet Oral Daily   cholecalciferol  1,000 Units Oral Daily   clopidogrel  75 mg Oral Daily   diltiazem  180 mg Oral Daily   dorzolamide  1 drop Both Eyes BID   ezetimibe  10 mg Oral Daily   ipratropium  0.5 mg Nebulization Q4H   isosorbide mononitrate  30 mg Oral Daily   latanoprost  1 drop Both Eyes QHS   magnesium oxide  400 mg Oral Daily   methylPREDNISolone (SOLU-MEDROL) injection  80  mg Intravenous Q24H   multivitamin-lutein  2 capsule Oral Daily   pregabalin  50 mg Oral BID   rosuvastatin  20 mg Oral Daily   vitamin E  200 Units Oral Daily    Family History    Family History  Problem Relation Age of Onset   CAD Mother    CAD Father    Cancer Son 39       lung cancer   She indicated that her mother is deceased. She indicated that her father is deceased. She indicated that both of her daughters are alive. She indicated that both of her sons are alive. She indicated that her other is alive.   Social History    Social History   Socioeconomic History   Marital status: Married    Spouse name: Not on file   Number of children: 4   Years of education: Not on file   Highest education level: 12th grade  Occupational History   Occupation: retired  Tobacco Use   Smoking status: Never   Smokeless tobacco: Never   Tobacco comments:    never   Media planner   Vaping Use: Never used  Substance and Sexual Activity   Alcohol use: No   Drug use: Never   Sexual activity: Not on file  Other Topics Concern   Not on file  Social History Narrative   Lives at home with Husband. Active and Independent at baseline.   Social Determinants of Health   Financial Resource Strain: Low Risk    Difficulty of Paying Living Expenses: Not hard at all  Food Insecurity: No Food Insecurity   Worried About Charity fundraiser in the Last Year: Never true   [place] in the Last Year: Never true  Transportation Needs: No Transportation Needs   Lack of Transportation (Medical): No   Lack of Transportation (Non-Medical): No  Physical Activity: Inactive   Days of Exercise per Week: 0 days   Minutes of Exercise per Session: 0 min  Stress: No Stress Concern Present   Feeling of Stress : Not at all  Social Connections: Moderately Isolated   Frequency of Communication with Friends and Family: More than three times a week   Frequency of Social Gatherings with Friends and Family:  More than three times a week   Attends Religious Services: Never   Marine scientist or Organizations: No   Attends Archivist Meetings: Never   Marital Status: Married  Intimate Partner Violence: Not At Risk   Fear of Current or Ex-Partner: No   Emotionally Abused: No   Physically Abused: No   Sexually Abused: No     Review of Systems    General:  No chills, fever, night sweats or weight changes.  Cardiovascular:  +++ chest tightness in the setting of dyspnea and wheezing, +++ dyspnea on exertion, no edema, orthopnea, palpitations, paroxysmal nocturnal dyspnea. Dermatological: No rash, lesions/masses Respiratory: No cough, +++ dyspnea, +++ wheezing Urologic: No hematuria, dysuria Abdominal:   No nausea, vomiting, diarrhea, bright red blood per rectum, melena, or hematemesis Neurologic:  No visual changes, wkns, changes in mental status. All other systems reviewed and are otherwise negative except as noted above.  Physical Exam    Blood pressure (!) 146/69, pulse 78, temperature 98.4 F (36.9 C), temperature source Oral, resp. rate 16, height 5\' 5"  (1.651 m), weight 58.5 kg, SpO2 98 %.  General: Pleasant, NAD Psych: Normal affect. Neuro: Alert and oriented X 3. Moves all extremities spontaneously. HEENT: Normal  Neck: Supple without bruits or JVD. Lungs:  Resp regular and unlabored, diminished breath sounds bilaterally. Heart: RRR no s3, s4, do not appreciate an MR/MS murmur.  Right radial catheterization site is without bleeding, bruit, or hematoma. Abdomen: Soft, non-tender, non-distended, BS + x 4.  Extremities: No clubbing, cyanosis or edema. DP/PT2+, Radials 2+ and equal bilaterally.  Labs    Cardiac Enzymes Recent Labs  Lab 08/06/21 0802 08/06/21 1030  TROPONINIHS 1,505* 1,284*      Lab Results  Component Value Date   WBC 9.3 08/06/2021   HGB 10.7 (L) 08/06/2021   HCT 33.7 (L) 08/06/2021   MCV 76.2 (L) 08/06/2021   PLT 206 08/06/2021     Recent Labs  Lab 08/06/21 0802  NA 132*  K 3.5  CL 97*  CO2 25  BUN 10  CREATININE 0.72  CALCIUM 8.4*  PROT 6.4*  BILITOT 0.9  ALKPHOS 87  ALT 55*  AST 54*  GLUCOSE 126*   Lab Results  Component Value Date   CHOL 119 09/17/2020   HDL 66 09/17/2020   LDLCALC 40 09/17/2020   TRIG 58 09/17/2020   Lab Results  Component Value Date   DDIMER 12.17 (H) 08/06/2021   Lab Results  Component Value Date   INR 1.1 08/06/2021   INR 1.2 08/03/2021   INR 1.2 08/02/2021      Radiology Studies    DG Chest 2 View  Result Date: 08/06/2021 CLINICAL DATA:  Shortness of breath, cough, fever EXAM: CHEST - 2 VIEW COMPARISON:  Chest radiograph 05/14/2020 FINDINGS: The heart is enlarged. The upper mediastinal contours are within normal limits. The lungs are hyperinflated consistent with COPD. Reticular opacities in the lateral right base are unchanged, likely reflecting scarring. Blunting of the costophrenic angles also likely reflects scar. There is no other focal airspace disease. There is no pleural effusion. There is no pneumothorax. There is no acute osseous abnormality. IMPRESSION: Cardiomegaly and scarring in the lateral right base, not significantly changed. No radiographic evidence of acute cardiopulmonary process. Electronically Signed   By: Valetta Mole M.D.   On: 08/06/2021 08:32   CT Angio Chest PE W and/or Wo Contrast  Result Date: 08/06/2021 CLINICAL DATA:  Pulmonary embolism (PE) suspected, high prob; shortness of breath, no fever EXAM: CT ANGIOGRAPHY CHEST WITH CONTRAST TECHNIQUE: Multidetector CT imaging of the chest was performed using the standard protocol during bolus administration of intravenous contrast. Multiplanar CT image reconstructions and  MIPs were obtained to evaluate the vascular anatomy. CONTRAST:  64mL OMNIPAQUE IOHEXOL 350 MG/ML SOLN COMPARISON:  None. FINDINGS: Cardiovascular: Satisfactory opacification of the pulmonary arteries to the segmental level. No  evidence of pulmonary embolism. Cardiomegaly. No pericardial effusion. Coronary artery calcification. Aortic atherosclerosis. Mediastinum/Nodes: Mildly enlarged mediastinal nodes. For example, pretracheal node measuring 1 cm short axis (series 5, image 116). Prevascular node measuring 1 cm short axis (image 142). Lungs/Pleura: Imaging during expiration. Peribronchial thickening. There is mucous plugging in the lower lobes. Patchy ground-glass density. Subsegmental atelectasis/scarring at the lung bases. Subpleural cystic change at the lung bases. Small right pleural effusion. No pneumothorax. Upper Abdomen: No acute abnormality. Musculoskeletal: Decreased osseous mineralization. No acute abnormality. Review of the MIP images confirms the above findings. IMPRESSION: No evidence of acute pulmonary embolism. Lower lobe mucous plugging. Peribronchial thickening, which is probably chronic. Patchy ground-glass density may reflect atelectasis, mild edema, or a combination. Cardiomegaly.  Coronary artery and aortic atherosclerosis. Electronically Signed   By: Macy Mis M.D.   On: 08/06/2021 09:44   ECG & Cardiac Imaging    Afib, 71 nonspecific ST/T changes. QTc 508 - personally reviewed.  Assessment & Plan    1.  Non-STEMI versus demand ischemia/coronary artery disease patient with history of CAD status post drug-eluting stent placement to the proximal LAD and diagonal similar 16 with subsequent discharge December 17.  She felt well over the weekend but began experiencing malaise, fatigue, and increasing dyspnea with associated wheezing on December 19.  This worsened overnight, prompting call to EMS and ED evaluation.  There, ECGs without acute ST or T changes.  Symptoms have improved somewhat with steroids and nebulizers.  Initial troponin was 1505 with follow-up of 1284.  CTA of the chest was negative for PE.  It is possible that troponin is downtrending from procedure last week however, this cannot be  improving.  Given worsening respiratory failure, chest tightness, and elevated troponin status post recent complex PCI, will plan on diagnostic catheterization in the a.m.  The patient understands that risks include but are not limited to stroke (1 in 1000), death (1 in 35), kidney failure [usually temporary] (1 in 500), bleeding (1 in 200), allergic reaction [possibly serious] (1 in 200), and agrees to proceed.  Hold warfarin continue aspirin, Plavix, heparin, nitrate, statin, and Zetia.  2.  Acute respiratory failure/acute heart failure with preserved ejection fraction: See 1.  Patient with a history of asthma and needed increasing wheezing and dyspnea throughout the day yesterday culminating progressive dyspnea overnight, prompting presentation.  She seems to have responded to steroids and nebulizers, and has also received a dose of intravenous Lasix in the setting of elevated BNP and question of edema on CT.  She does not appear to be significantly volume overloaded on examination.  Continue home dose of oral Lasix.  Follow volume status closely.  3.  Essential hypertension: Blood pressure currently elevated in the emergency department.  Resume home medications at time.  4.  Hyperlipidemia: LDL of 40 in January.  She is on statin and Zetia therapy.  5.  Persistent atrial fibrillation: Ongoing since at least November, potentially longer.  She is well rate controlled on diltiazem therapy.  She has been chronic anticoagulated with warfarin and most recently, has been receiving Lovenox bridge.  Previous home dose of amiodarone has been on hold in the setting of break in oral anticoagulation with recent procedure.  Continue to hold amiodarone with plan to resume at outpatient follow-up once INR therapeutic,  likely to be followed by cardioversion after 4 weeks of therapeutic INRs.  Will bridge with heparin while she is hospitalized.  6.  Moderate mitral regurgitation/moderate mitral stenosis: Status post  TEE in October showing moderate mitral valve disease.  Outpatient surveillance echo in 2023.  7.  Carotid arterial disease: Status post prior right carotid stenting with patent stent on ultrasound earlier this year.  She is followed by vascular surgery.  Continue statin therapy.  8.  Microcytic anemia: H&H down slightly since discharge.  Following heparin.  Signed, Murray Hodgkins, NP 08/06/2021, 12:44 PM  For questions or updates, please contact   Please consult [email or web address] for contact info under Cardiology/STEMI.

## 2021-08-06 NOTE — ED Notes (Signed)
Was given Solumedrol 125 iv  and duo neb prior to arrival

## 2021-08-06 NOTE — Telephone Encounter (Signed)
Patient's daughter called in reporting [name] has had trouble with ongoing shortness of breath since yesterday. Recent PCI with stenting on 12/16. Has been complaint with her meds including plavix/ASA. Currently on lovenox bridge. Amiodarone held until INR is therapeutic. Last night developed shortness of breath, used inhaler with some improvement. Was able to sleep until around 3am she was awoken with worsening shortness of breath. Reports weights have been the same, maybe some mild swelling in the LE. Has tried inhalers without much relief. Reports feeling significantly worse this morning, more short of breath. No chest pain. I advised that she be seen in the ED for further evaluation as etiology of dyspnea is unclear at this time. Daughter was agreeable and thanked me for callback.

## 2021-08-06 NOTE — ED Provider Notes (Signed)
[facility] Emergency Department Provider Note  ____________________________________________   Event Date/Time   First MD Initiated Contact with Patient 08/06/21 [id number]     (approximate)  I have reviewed the triage vital signs and the nursing notes.   HISTORY  Chief Complaint Shortness of Breath    HPI Molly Hodges is a 87 y.o. female with paroxysmal A. fib on warfarin, hypertension, mitral stenosis who comes in with concerns for shortness of breath.  Patient reports she had a catheterization done on 12/16.  She had 1 stent placed.  She was discharged on heparin shots with warfarin bridge.  She reports that while in the hospital she was feeling short of breath and they had done some diuresis and she started to feel better so she went home.  However since being home she started to feel more short of breath, constant, worse with exertion, better at rest.  She states that she has had some asthma wheezing previously but nothing that was significant as today therefore she wanted to come in to be evaluated       Past Medical History:  Diagnosis Date   Asthma    Carotid artery disease ([place])    Community acquired pneumonia    Essential hypertension    Glaucoma    Hyperlipidemia    Mitral regurgitation    a. TTE 08/2015: EF 60-65%, normal wall motion, mild MR, mildly dilated left atrium measuring 40 mm, RVSF normal, PASP normal   PAF (paroxysmal atrial fibrillation) ([place]) 09/17/2015   a. s/p DCCV 11/15/2015; CHADS2VASc => 4 (HTN, age x 2, female); c. on Eliquis    Patient Active Problem List   Diagnosis Date Noted   Hypokalemia 08/03/2021   Coronary artery disease with stable angina pectoris ([place]) 08/02/2021   Coronary artery disease of native artery of native heart with stable angina pectoris ([place]) 08/02/2021   Moderate mitral regurgitation 06/17/2021   Mitral valve stenosis 05/17/2021   Dyspnea 01/01/2021   Chronic respiratory failure with hypoxia  ([place]) 01/01/2021   PAD (peripheral artery disease) ([place]) 11/13/2020   Hypochromic microcytic anemia 09/17/2020   Hyperlipidemia 09/17/2020   Paroxysmal atrial fibrillation ([place]) 08/30/2020   Carotid stenosis, right 08/15/2020   Carotid stenosis 07/17/2020   Meningioma ([place]) 07/04/2020   Cyst of thyroid 07/03/2020   Hypothyroidism 07/03/2020   Long-term use of high-risk medication 11/11/2016   COPD with acute bronchitis ([place]) 11/11/2016   Lower extremity edema 05/29/2016   Persistent atrial fibrillation ([facility])    Elevated troponin    Hypoxia    Encounter for anticoagulation discussion and counseling    Premature ventricular contraction 03/26/2015   Vitamin D deficiency 03/26/2015   Peripheral neuropathy 03/26/2015   Hypertension 03/26/2015   Asthma 03/26/2015    Past Surgical History:  Procedure Laterality Date   ABDOMINAL HYSTERECTOMY  1987   due to heavy bleeding   APPENDECTOMY     CARDIAC CATHETERIZATION     CAROTID PTA/STENT INTERVENTION Right 08/15/2020   Procedure: CAROTID PTA/STENT INTERVENTION;  Surgeon: Algernon Huxley, MD;  Location: [place] [facility];  Service: Cardiovascular;  Laterality: Right;   CATARACT EXTRACTION     CORONARY STENT INTERVENTION N/A 08/02/2021   Procedure: CORONARY STENT INTERVENTION;  Surgeon: Nelva Bush, MD;  Location: [place] [facility];  Service: Cardiovascular;  Laterality: N/A;   ELECTROPHYSIOLOGIC STUDY N/A 11/15/2015   Procedure: CARDIOVERSION;  Surgeon: Minna Merritts, MD;  Location: [facility];  Service: Cardiovascular;  Laterality: N/A;  INTRAVASCULAR IMAGING/OCT N/A 08/02/2021   Procedure: INTRAVASCULAR IMAGING/OCT;  Surgeon: Nelva Bush, MD;  Location: [place] [facility];  Service: Cardiovascular;  Laterality: N/A;   LEFT HEART CATH AND CORONARY ANGIOGRAPHY N/A 08/02/2021   Procedure: LEFT HEART CATH AND CORONARY ANGIOGRAPHY;  Surgeon: Nelva Bush, MD;  Location: [place] [facility];  Service: Cardiovascular;   Laterality: N/A;   RIGHT/LEFT HEART CATH AND CORONARY ANGIOGRAPHY N/A 05/17/2021   Procedure: RIGHT/LEFT HEART CATH AND CORONARY ANGIOGRAPHY;  Surgeon: Nelva Bush, MD;  Location: [place] [facility];  Service: Cardiovascular;  Laterality: N/A;   TEE WITHOUT CARDIOVERSION N/A 06/13/2021   Procedure: TRANSESOPHAGEAL ECHOCARDIOGRAM (TEE);  Surgeon: Minna Merritts, MD;  Location: [facility];  Service: Cardiovascular;  Laterality: N/A;    Prior to Admission medications   Medication Sig Start Date End Date Taking? Authorizing Provider  albuterol (VENTOLIN HFA) 108 (90 Base) MCG/ACT inhaler Inhale 2 puffs into the lungs every 6 (six) hours as needed for wheezing or shortness of breath. Patient not taking: Reported on 07/30/2021 05/14/20   Naaman Plummer, MD  aspirin EC 81 MG tablet Take 81 mg by mouth daily. Swallow whole.    [provider]  Calcium Carbonate-Vitamin D 600-400 MG-UNIT tablet Take 1 tablet by mouth daily.    [provider]  cholecalciferol (VITAMIN D) 1000 UNITS tablet Take 1,000 Units by mouth daily.    [provider]  clopidogrel (PLAVIX) 75 MG tablet Take 1 tablet (75 mg total) by mouth daily. 08/03/21   Strader, Fransisco Hertz, PA-C  diltiazem (CARDIZEM CD) 180 MG 24 hr capsule TAKE 1 CAPSULE EVERY DAY 06/07/21   Birdie Sons, MD  dorzolamide (TRUSOPT) 2 % ophthalmic solution Place 1 drop into both eyes 2 (two) times daily.     [provider]  enoxaparin (LOVENOX) 60 MG/0.6ML injection Inject 0.6 mLs (60 mg total) into the skin every 12 (twelve) hours for 5 days. 08/03/21 08/08/21  Strader, Fransisco Hertz, PA-C  ezetimibe (ZETIA) 10 MG tablet TAKE 1 TABLET BY MOUTH DAILY. 06/07/21   Minna Merritts, MD  furosemide (LASIX) 40 MG tablet Take 1 tablet (40 mg total) by mouth daily. 05/17/21 05/17/22  End, Harrell Gave, MD  ipratropium-albuterol (DUONEB) 0.5-2.5 (3) MG/3ML SOLN Take 3 mLs by nebulization every 4 (four) hours as needed. Patient  not taking: Reported on 07/30/2021 04/29/21   Birdie Sons, MD  isosorbide mononitrate (IMDUR) 30 MG 24 hr tablet Take 1 tablet (30 mg total) by mouth daily. 07/22/21 10/20/21  Minna Merritts, MD  latanoprost (XALATAN) 0.005 % ophthalmic solution Place 1 drop into both eyes at bedtime.     [provider]  magnesium oxide (MAG-OX) 400 MG tablet Take 400 mg by mouth daily.    [provider]  Multiple Vitamins-Minerals (PRESERVISION AREDS 2+MULTI VIT) CAPS Take 2 capsules by mouth daily.    [provider]  nitroGLYCERIN (NITROSTAT) 0.4 MG SL tablet Place 1 tablet (0.4 mg total) under the tongue every 5 (five) minutes as needed (for chest pain or shortness of breath). 05/17/21 05/17/22  End, Harrell Gave, MD  potassium chloride (KLOR-CON) 10 MEQ tablet TAKE 1 TABLET BY MOUTH DAILY 05/17/21   Minna Merritts, MD  pregabalin (LYRICA) 50 MG capsule TAKE 1 CAPSULE BY MOUTH 2 TIMES DAILY 01/19/21   Birdie Sons, MD  rosuvastatin (CRESTOR) 20 MG tablet TAKE 1 TABLET BY MOUTH DAILY. 06/07/21   Minna Merritts, MD  vitamin C (ASCORBIC ACID) 500 MG  tablet Take 500 mg by mouth daily.    [provider]  vitamin E 100 UNIT capsule Take 200 Units by mouth daily.    [provider]  warfarin (COUMADIN) 2.5 MG tablet Take 1 tablet (2.5 mg total) by mouth daily. Take as directed by the anti-coag clinic 07/10/21   Minna Merritts, MD    Allergies Shellfish allergy, Azithromycin, Tamiflu [oseltamivir phosphate], and Albuterol  Family History  Problem Relation Age of Onset   CAD Mother    CAD Father    Cancer Son 63       lung cancer    Social History Social History   Tobacco Use   Smoking status: Never   Smokeless tobacco: Never   Tobacco comments:    never   Vaping Use   Vaping Use: Never used  Substance Use Topics   Alcohol use: No   Drug use: Never      Review of Systems Constitutional: No fever/chills Eyes: No visual changes. ENT: No  sore throat. Cardiovascular: No chest pain Respiratory: Positive for SOB Gastrointestinal: No abdominal pain.  No nausea, no vomiting.  No diarrhea.  No constipation. Genitourinary: Negative for dysuria. Musculoskeletal: Negative for back pain. Skin: Negative for rash. Neurological: Negative for headaches, focal weakness or numbness. All other ROS negative ____________________________________________   PHYSICAL EXAM:  VITAL SIGNS: ED Triage Vitals  Enc Vitals Group     BP 08/06/21 0801 126/65     Pulse Rate 08/06/21 0801 65     Resp 08/06/21 0801 18     Temp 08/06/21 0801 98.4 F (36.9 C)     Temp Source 08/06/21 0801 Oral     SpO2 08/06/21 0801 97 %     Weight 08/06/21 0749 130 lb 8.2 oz (59.2 kg)     Height 08/06/21 0749 5\' 5"  (1.651 m)     Head Circumference --      Peak Flow --      Pain Score 08/06/21 0749 0     Pain Loc --      Pain Edu? --      Excl. in [place]? --     Constitutional: Alert and oriented. Well appearing and in no acute distress. Eyes: Conjunctivae are normal. EOMI. Head: Atraumatic. Nose: No congestion/rhinnorhea. Mouth/Throat: Mucous membranes are moist.   Neck: No stridor. Trachea Midline. FROM Cardiovascular: Normal rate, regular rhythm. Grossly normal heart sounds.  Good peripheral circulation. Respiratory: Wheezing noted more on the right than the left, slightly winded with talking Gastrointestinal: Soft and nontender. No distention. No abdominal bruits.  Musculoskeletal: No lower extremity tenderness nor edema.  No joint effusions. Neurologic:  Normal speech and language. No gross focal neurologic deficits are appreciated.  Skin:  Skin is warm, dry and intact. No rash noted. Psychiatric: Mood and affect are normal. Speech and behavior are normal. GU: Deferred   ____________________________________________   LABS (all labs ordered are listed, but only abnormal results are displayed)  Labs Reviewed  D-DIMER, QUANTITATIVE - Abnormal; Notable  for the following components:      Result Value   D-Dimer, Quant 12.17 (*)    All other components within normal limits  CBC WITH DIFFERENTIAL/PLATELET - Abnormal; Notable for the following components:   Hemoglobin 10.7 (*)    HCT 33.7 (*)    MCV 76.2 (*)    MCH 24.2 (*)    RDW 15.7 (*)    All other components within normal limits  COMPREHENSIVE METABOLIC PANEL - Abnormal; Notable  for the following components:   Sodium 132 (*)    Chloride 97 (*)    Glucose, Bld 126 (*)    Calcium 8.4 (*)    Total Protein 6.4 (*)    Albumin 3.3 (*)    AST 54 (*)    ALT 55 (*)    All other components within normal limits  TROPONIN I (HIGH SENSITIVITY) - Abnormal; Notable for the following components:   Troponin I (High Sensitivity) 1,505 (*)    All other components within normal limits  BRAIN NATRIURETIC PEPTIDE   ____________________________________________   ED ECG REPORT I, Vanessa [place], the attending physician, personally viewed and interpreted this ECG.  A. fib rate of 71 without any ST elevation or T wave inversions, QTC prolonged at 508 ____________________________________________  RADIOLOGY I, Vanessa [place], personally viewed and evaluated these images (plain radiographs) as part of my medical decision making, as well as reviewing the written report by the radiologist.  ED MD interpretation: No pneumonia  Official radiology report(s): DG Chest 2 View  Result Date: 08/06/2021 CLINICAL DATA:  Shortness of breath, cough, fever EXAM: CHEST - 2 VIEW COMPARISON:  Chest radiograph 05/14/2020 FINDINGS: The heart is enlarged. The upper mediastinal contours are within normal limits. The lungs are hyperinflated consistent with COPD. Reticular opacities in the lateral right base are unchanged, likely reflecting scarring. Blunting of the costophrenic angles also likely reflects scar. There is no other focal airspace disease. There is no pleural effusion. There is no pneumothorax. There is no acute  osseous abnormality. IMPRESSION: Cardiomegaly and scarring in the lateral right base, not significantly changed. No radiographic evidence of acute cardiopulmonary process. Electronically Signed   By: Valetta Mole M.D.   On: 08/06/2021 08:32    ____________________________________________   PROCEDURES  Procedure(s) performed (including Critical Care):  .Critical Care Performed by: Vanessa [place], MD Authorized by: Vanessa [place], MD   Critical care provider statement:    Critical care time (minutes):  30   Critical care was necessary to treat or prevent imminent or life-threatening deterioration of the following conditions:  Cardiac failure   Critical care was time spent personally by me on the following activities:  Development of treatment plan with patient or surrogate, discussions with consultants, evaluation of patient's response to treatment, examination of patient, ordering and review of laboratory studies, ordering and review of radiographic studies, ordering and performing treatments and interventions, pulse oximetry, re-evaluation of patient's condition and review of old charts   ____________________________________________   INITIAL IMPRESSION / [place] / ED COURSE   Molly Hodges was evaluated in Emergency Department on 08/06/2021 for the symptoms described in the history of present illness. She was evaluated in the context of the global COVID-19 pandemic, which necessitated consideration that the patient might be at risk for infection with the SARS-CoV-2 virus that causes COVID-19. Institutional protocols and algorithms that pertain to the evaluation of patients at risk for COVID-19 are in a state of rapid change based on information released by regulatory bodies including the CDC and federal and state organizations. These policies and algorithms were followed during the patient's care in the ED.     Pt presents with SOB. Differential includes: PNA-will get  xray to evaluation Anemia-CBC to evaluate ACS- will get trops Arrhythmia-Will get EKG and keep on monitor.  COVID- will get testing per algorithm. PE-lower suspicion given no risk factors and other cause more likely  Troponin significantly elevated at 1500 and D-dimer elevated  at 75.  We will send patient for stat CT PE to evaluate for pulmonary embolism.  Add on INR to look for warfarin level.  We will consult cardiology.  Cardiology is currently in surgery therefore I have sent them a message to come and see the patient.  I have started patient on heparin given CT PE is negative for pulmonary embolism and this is most likely an NSTEMI.  Patient does have a little wheezing so I placed some steroids and some ipratropium inhalers in case it could be related to that.  Patient will require admission to the hospital due to NSTEMI  ____________________________________________   FINAL CLINICAL IMPRESSION(S) / ED DIAGNOSES   Final diagnoses:  NSTEMI (non-ST elevated myocardial infarction) ([place])     MEDICATIONS GIVEN DURING THIS VISIT:  Medications  methylPREDNISolone sodium succinate (SOLU-MEDROL) 125 mg/2 mL injection 125 mg (has no administration in time range)  ipratropium (ATROVENT) nebulizer solution 0.5 mg (has no administration in time range)  dextromethorphan-guaiFENesin (MUCINEX DM) 30-600 MG per 12 hr tablet 1 tablet (has no administration in time range)  iohexol (OMNIPAQUE) 350 MG/ML injection 75 mL (75 mLs Intravenous Contrast Given 08/06/21 0926)  aspirin chewable tablet 324 mg (324 mg Oral Given 08/06/21 1034)     ED Discharge Orders     None        Note:  This document was prepared using Dragon voice recognition software and may include unintentional dictation errors.   Vanessa Colon, MD 08/06/21 1049

## 2021-08-06 NOTE — ED Triage Notes (Signed)
Presents with some SOB  increased with exertion and cough   states sx's started yesterday  used DUO nebs times 2 with min relief  no fever  recent stent placement

## 2021-08-07 ENCOUNTER — Encounter: Payer: Self-pay | Admitting: Internal Medicine

## 2021-08-07 ENCOUNTER — Encounter
Admission: EM | Disposition: A | Discharge status: Home / Self Care | Payer: Self-pay | Source: Home / Self Care | Attending: Hospitalist

## 2021-08-07 ENCOUNTER — Encounter: Payer: Self-pay | Admitting: Cardiovascular Disease

## 2021-08-07 HISTORY — PX: LEFT HEART CATH AND CORONARY ANGIOGRAPHY: CATH118249

## 2021-08-07 LAB — CBC
HCT: 28.9 % — ABNORMAL LOW (ref 36.0–46.0)
Hemoglobin: 9.7 g/dL — ABNORMAL LOW (ref 12.0–15.0)
MCH: 25.1 pg — ABNORMAL LOW (ref 26.0–34.0)
MCHC: 33.6 g/dL (ref 30.0–36.0)
MCV: 74.7 fL — ABNORMAL LOW (ref 80.0–100.0)
Platelets: 222 10*3/uL (ref 150–400)
RBC: 3.87 MIL/uL (ref 3.87–5.11)
RDW: 15.7 % — ABNORMAL HIGH (ref 11.5–15.5)
WBC: 10.3 10*3/uL (ref 4.0–10.5)
nRBC: 0 % (ref 0.0–0.2)

## 2021-08-07 LAB — BASIC METABOLIC PANEL
Anion gap: 6 (ref 5–15)
BUN: 15 mg/dL (ref 8–23)
CO2: 26 mmol/L (ref 22–32)
Calcium: 8.5 mg/dL — ABNORMAL LOW (ref 8.9–10.3)
Chloride: 98 mmol/L (ref 98–111)
Creatinine, Ser: 0.63 mg/dL (ref 0.44–1.00)
GFR, Estimated: >60 mL/min (ref >60)
Glucose, Bld: 192 mg/dL — ABNORMAL HIGH (ref 70–99)
Potassium: 3.8 mmol/L (ref 3.5–5.1)
Sodium: 130 mmol/L — ABNORMAL LOW (ref 135–145)

## 2021-08-07 LAB — MAGNESIUM: Magnesium: 2.4 mg/dL (ref 1.7–2.4)

## 2021-08-07 LAB — LIPID PANEL
Cholesterol: 100 mg/dL (ref 0–200)
HDL: 44 mg/dL (ref >40)
LDL Cholesterol: 47 mg/dL (ref 0–99)
Total CHOL/HDL Ratio: 2.3 RATIO
Triglycerides: 46 mg/dL (ref <150)
VLDL: 9 mg/dL (ref 0–40)

## 2021-08-07 LAB — PROTIME-INR
INR: 1.1 (ref 0.8–1.2)
Prothrombin Time: 14.2 seconds (ref 11.4–15.2)

## 2021-08-07 SURGERY — LEFT HEART CATH AND CORONARY ANGIOGRAPHY
Anesthesia: Moderate Sedation

## 2021-08-07 MED ORDER — LIDOCAINE HCL 1 % IJ SOLN
INTRAMUSCULAR | Status: AC
  Filled 2021-08-07: qty 20

## 2021-08-07 MED ORDER — SODIUM CHLORIDE 0.9 % IV SOLN: 250.0000 mL | INTRAVENOUS | Status: DC | PRN

## 2021-08-07 MED ORDER — HEPARIN SODIUM (PORCINE) 1000 UNIT/ML IJ SOLN
INTRAMUSCULAR | Status: DC | PRN
  Administered 2021-08-07: 2500 [IU] via INTRAVENOUS

## 2021-08-07 MED ORDER — SODIUM CHLORIDE 0.9% FLUSH
3.0000 mL | Freq: Two times a day (BID) | INTRAVENOUS | Status: DC
  Administered 2021-08-07 – 2021-08-09 (×5): 3 mL via INTRAVENOUS

## 2021-08-07 MED ORDER — MIDAZOLAM HCL 2 MG/2ML IJ SOLN
INTRAMUSCULAR | Status: AC
  Filled 2021-08-07: qty 2

## 2021-08-07 MED ORDER — FENTANYL CITRATE (PF) 100 MCG/2ML IJ SOLN
INTRAMUSCULAR | Status: DC | PRN
  Administered 2021-08-07 (×2): 25 ug via INTRAVENOUS

## 2021-08-07 MED ORDER — LIDOCAINE HCL (PF) 1 % IJ SOLN
INTRAMUSCULAR | Status: DC | PRN
  Administered 2021-08-07: 2 mL

## 2021-08-07 MED ORDER — VERAPAMIL HCL 2.5 MG/ML IV SOLN
INTRAVENOUS | Status: AC
  Filled 2021-08-07: qty 2

## 2021-08-07 MED ORDER — SODIUM CHLORIDE 0.9 % IV SOLN: INTRAVENOUS | Status: AC

## 2021-08-07 MED ORDER — MIDAZOLAM HCL 2 MG/2ML IJ SOLN
INTRAMUSCULAR | Status: DC | PRN
  Administered 2021-08-07: .5 mg via INTRAVENOUS

## 2021-08-07 MED ORDER — HEPARIN SODIUM (PORCINE) 1000 UNIT/ML IJ SOLN
INTRAMUSCULAR | Status: AC
  Filled 2021-08-07: qty 10

## 2021-08-07 MED ORDER — ACETAMINOPHEN 325 MG PO TABS
650.0000 mg | ORAL_TABLET | ORAL | Status: DC | PRN
  Administered 2021-08-07: 19:00:00 650 mg via ORAL
  Filled 2021-08-07: qty 2

## 2021-08-07 MED ORDER — HEPARIN (PORCINE) IN NACL 1000-0.9 UT/500ML-% IV SOLN
INTRAVENOUS | Status: AC
  Filled 2021-08-07: qty 1000

## 2021-08-07 MED ORDER — WARFARIN - PHARMACIST DOSING INPATIENT: Freq: Every day | Status: DC

## 2021-08-07 MED ORDER — SODIUM CHLORIDE 0.9% FLUSH: 3.0000 mL | INTRAVENOUS | Status: DC | PRN

## 2021-08-07 MED ORDER — WARFARIN SODIUM 5 MG PO TABS: 5.0000 mg | ORAL_TABLET | Freq: Once | ORAL | Status: DC

## 2021-08-07 MED ORDER — HEPARIN (PORCINE) IN NACL 1000-0.9 UT/500ML-% IV SOLN
INTRAVENOUS | Status: DC | PRN
  Administered 2021-08-07 (×2): 500 mL

## 2021-08-07 MED ORDER — WARFARIN SODIUM 5 MG PO TABS
5.0000 mg | ORAL_TABLET | Freq: Once | ORAL | Status: AC
  Administered 2021-08-07: 16:00:00 5 mg via ORAL
  Filled 2021-08-07: qty 1

## 2021-08-07 MED ORDER — VERAPAMIL HCL 2.5 MG/ML IV SOLN
INTRAVENOUS | Status: DC | PRN
  Administered 2021-08-07: 2.5 mg via INTRA_ARTERIAL

## 2021-08-07 MED ORDER — FENTANYL CITRATE (PF) 100 MCG/2ML IJ SOLN
INTRAMUSCULAR | Status: AC
  Filled 2021-08-07: qty 2

## 2021-08-07 MED ORDER — IOHEXOL 300 MG/ML  SOLN
INTRAMUSCULAR | Status: DC | PRN
  Administered 2021-08-07: 08:00:00 31 mL

## 2021-08-07 MED ORDER — HEPARIN (PORCINE) 25000 UT/250ML-% IV SOLN
1000.0000 [IU]/h | INTRAVENOUS | Status: DC
  Administered 2021-08-07: 16:00:00 850 [IU]/h via INTRAVENOUS
  Filled 2021-08-07 (×2): qty 250

## 2021-08-07 MED FILL — Heparin Sod (Porcine)-NaCl IV Soln 1000 Unit/500ML-0.9%: INTRAVENOUS | Qty: 1000 | Status: AC

## 2021-08-07 MED FILL — Verapamil HCl IV Soln 2.5 MG/ML: INTRAVENOUS | Qty: 2 | Status: AC

## 2021-08-07 SURGICAL SUPPLY — 12 items
CATH 5F 110X4 TIG (CATHETERS) ×2 IMPLANT
DEVICE RAD TR BAND REGULAR (VASCULAR PRODUCTS) ×2 IMPLANT
DRAPE BRACHIAL (DRAPES) ×2 IMPLANT
GLIDESHEATH SLEND SS 6F .021 (SHEATH) ×2 IMPLANT
GUIDEWIRE INQWIRE 1.5J.035X260 (WIRE) IMPLANT
INQWIRE 1.5J .035X260CM (WIRE) ×3
KIT SYRINGE INJ CVI SPIKEX1 (MISCELLANEOUS) ×2 IMPLANT
PACK CARDIAC CATH (CUSTOM PROCEDURE TRAY) ×3 IMPLANT
PROTECTION STATION PRESSURIZED (MISCELLANEOUS) ×3
SET ATX SIMPLICITY (MISCELLANEOUS) ×2 IMPLANT
STATION PROTECTION PRESSURIZED (MISCELLANEOUS) IMPLANT
WIRE HITORQ VERSACORE ST 145CM (WIRE) ×2 IMPLANT

## 2021-08-07 NOTE — Progress Notes (Signed)
PROGRESS NOTE    Molly Hodges  VOZ:[id number] DOB: April 05, 1932 DOA: 08/06/2021 PCP: Birdie Sons, MD  [phone]   Assessment & Plan:   Principal Problem:   NSTEMI (non-ST elevated myocardial infarction) [facility]) Active Problems:   Hypertension   Asthma exacerbation   Paroxysmal atrial fibrillation (HCC)   Hypothyroidism   Hyperlipidemia   PAD (peripheral artery disease) (HCC)   COPD exacerbation (HCC)   CAD (coronary artery disease)   Abnormal LFTs   Chronic diastolic CHF (congestive heart failure) (HCC)   Molly Hodges is a 87 y.o. female with medical history significant of COPD, asthma, hypertension, hyperlipidemia, hypothyroidism, atrial fibrillation on Coumadin, mitral valve prolapse, carotid artery stenosis, anemia, PVD, CAD, dCHF, who presents with shortness breath.   NSTEMI  hx of CAD s/p of stent.  Trop  1505. No CP. Consulted Dr. Fletcher Anon of card. --heart cath today with no obstruction found.   --appeared to be on ASA, plavix and coumadin.  May need to stop ASA. --cont heparin gtt for 48 hours  Asthma exacerbation and  COPD exacerbation ([place]):  Had wheezing on presentation.  Patient does not have significant sputum production.  No infiltration on chest x-ray.  was not started on antibiotics. --no significant dyspnea or wheezing today.   --d/c solumedrol   Hypertension --cont cardizem, lasix and Imdur   Paroxysmal atrial fibrillation (HCC) -Cardizem -Hold Coumadin since patient is on IV heparin   Hypothyroidism -Synthroid   Hyperlipidemia -Crestor, Zetia   PAD (peripheral artery disease) (HCC) -Plavix   Abnormal LFTs: Mild.  AST 53, ALT 55, total bilirubin 0.9, ALP 87   Chronic diastolic CHF (congestive heart failure) ([place]): 2D echo on 06/13/2021 showed EF of 55-80% with grade 3 diastolic dysfunction.  Patient has elevated BNP 555, but no JVD or leg edema.  Chest x-ray is negative for edema.  Does not seem to have CHF exacerbation --cont  home lasix    DVT prophylaxis: IE:PPIRJJO gtt Code Status: Limited code  Family Communication:  Level of care: Progressive Dispo:   The patient is from: home Anticipated d/c is to: home Anticipated d/c date is: tomorrow Patient currently is not medically ready to d/c due to: heparin gtt   Subjective and Interval History:  Pt went for heart cath today with no obstruction found.    Pt denied CP, dyspnea.  Had some cough with clear sputum.   Objective: Vitals:   08/07/21 1220 08/07/21 1315 08/07/21 1451 08/07/21 2003  BP: (!) 145/51 (!) 144/46 (!) 125/59 (!) 146/56  Pulse:   91 78  Resp: 20 20 16 18   Temp:   98.1 F (36.7 C) 98.1 F (36.7 C)  TempSrc:    Oral  SpO2: 99% 100% 96% 99%  Weight:      Height:        Intake/Output Summary (Last 24 hours) at 08/08/2021 0024 Last data filed at 08/07/2021 1900 Gross per 24 hour  Intake 564.05 ml  Output 300 ml  Net 264.05 ml   Filed Weights   08/06/21 0802 08/06/21 1706 08/07/21 0400  Weight: 58.5 kg 58.5 kg 55.7 kg    Examination:   Constitutional: NAD, AAOx3, sitting at side of bed HEENT: conjunctivae and lids normal, EOMI CV: No cyanosis.   RESP: normal respiratory effort, on RA Extremities: No effusions, edema in BLE SKIN: warm, dry Neuro: II - XII grossly intact.   Psych: Normal mood and affect.  Appropriate judgement and reason   Data Reviewed: I have personally  reviewed following labs and imaging studies  CBC: Recent Labs  Lab 08/03/21 0144 08/06/21 0802 08/07/21 0449  WBC 10.2 9.3 10.3  NEUTROABS  --  6.9  --   HGB 11.3* 10.7* 9.7*  HCT 35.1* 33.7* 28.9*  MCV 76.3* 76.2* 74.7*  PLT 232 206 962   Basic Metabolic Panel: Recent Labs  Lab 08/03/21 0144 08/06/21 0802 08/07/21 0449  NA 135 132* 130*  K 3.1* 3.5 3.8  CL 100 97* 98  CO2 25 25 26   GLUCOSE 110* 126* 192*  BUN 9 10 15   CREATININE 0.76 0.72 0.63  CALCIUM 8.4* 8.4* 8.5*  MG  --   --  2.4   GFR: Estimated Creatinine Clearance:  43.6 mL/min (by C-G formula based on SCr of 0.63 mg/dL). Liver Function Tests: Recent Labs  Lab 08/06/21 0802  AST 54*  ALT 55*  ALKPHOS 87  BILITOT 0.9  PROT 6.4*  ALBUMIN 3.3*   No results for input(s): LIPASE, AMYLASE in the last 168 hours. No results for input(s): AMMONIA in the last 168 hours. Coagulation Profile: Recent Labs  Lab 08/02/21 0703 08/03/21 0944 08/06/21 0909 08/07/21 0449  INR 1.2 1.2 1.1 1.1   Cardiac Enzymes: No results for input(s): CKTOTAL, CKMB, CKMBINDEX, TROPONINI in the last 168 hours. BNP (last 3 results) No results for input(s): PROBNP in the last 8760 hours. HbA1C: Recent Labs    08/06/21 1219  HGBA1C 6.2*   CBG: No results for input(s): GLUCAP in the last 168 hours. Lipid Profile: Recent Labs    08/07/21 0449  CHOL 100  HDL 44  LDLCALC 47  TRIG 46  CHOLHDL 2.3   Thyroid Function Tests: No results for input(s): TSH, T4TOTAL, FREET4, T3FREE, THYROIDAB in the last 72 hours. Anemia Panel: No results for input(s): VITAMINB12, FOLATE, FERRITIN, TIBC, IRON, RETICCTPCT in the last 72 hours. Sepsis Labs: No results for input(s): PROCALCITON, LATICACIDVEN in the last 168 hours.  Recent Results (from the past 240 hour(s))  Resp Panel by RT-PCR (Flu A&B, Covid) Nasopharyngeal Swab     Status: None   Collection Time: 08/06/21  9:09 AM   Specimen: Nasopharyngeal Swab; Nasopharyngeal(NP) swabs in vial transport medium  Result Value Ref Range Status   SARS Coronavirus 2 by RT PCR NEGATIVE NEGATIVE Final    Comment: (NOTE) SARS-CoV-2 target nucleic acids are NOT DETECTED.  The SARS-CoV-2 RNA is generally detectable in upper respiratory specimens during the acute phase of infection. The lowest concentration of SARS-CoV-2 viral copies this assay can detect is 138 copies/mL. A negative result does not preclude SARS-Cov-2 infection and should not be used as the sole basis for treatment or other patient management decisions. A negative result  may occur with  improper specimen collection/handling, submission of specimen other than nasopharyngeal swab, presence of viral mutation(s) within the areas targeted by this assay, and inadequate number of viral copies(<138 copies/mL). A negative result must be combined with clinical observations, patient history, and epidemiological information. The expected result is Negative.  Fact Sheet for Patients:  EntrepreneurPulse.com.au  Fact Sheet for Healthcare Providers:  IncredibleEmployment.be  This test is no t yet approved or cleared by the [place] FDA and  has been authorized for detection and/or diagnosis of SARS-CoV-2 by FDA under an Emergency Use Authorization (EUA). This EUA will remain  in effect (meaning this test can be used) for the duration of the COVID-19 declaration under Section 564(b)(1) of the Act, 21 U.S.C.section 360bbb-3(b)(1), unless the authorization is terminated  or revoked  sooner.       Influenza A by PCR NEGATIVE NEGATIVE Final   Influenza B by PCR NEGATIVE NEGATIVE Final    Comment: (NOTE) The Xpert Xpress SARS-CoV-2/FLU/RSV plus assay is intended as an aid in the diagnosis of influenza from Nasopharyngeal swab specimens and should not be used as a sole basis for treatment. Nasal washings and aspirates are unacceptable for Xpert Xpress SARS-CoV-2/FLU/RSV testing.  Fact Sheet for Patients: EntrepreneurPulse.com.au  Fact Sheet for Healthcare Providers: IncredibleEmployment.be  This test is not yet approved or cleared by the [place] FDA and has been authorized for detection and/or diagnosis of SARS-CoV-2 by FDA under an Emergency Use Authorization (EUA). This EUA will remain in effect (meaning this test can be used) for the duration of the COVID-19 declaration under Section 564(b)(1) of the Act, 21 U.S.C. section 360bbb-3(b)(1), unless the authorization is terminated  or revoked.  Performed at [facility], [address] [place], [place] [address]       Radiology Studies: DG Chest 2 View  Result Date: 08/06/2021 CLINICAL DATA:  Shortness of breath, cough, fever EXAM: CHEST - 2 VIEW COMPARISON:  Chest radiograph 05/14/2020 FINDINGS: The heart is enlarged. The upper mediastinal contours are within normal limits. The lungs are hyperinflated consistent with COPD. Reticular opacities in the lateral right base are unchanged, likely reflecting scarring. Blunting of the costophrenic angles also likely reflects scar. There is no other focal airspace disease. There is no pleural effusion. There is no pneumothorax. There is no acute osseous abnormality. IMPRESSION: Cardiomegaly and scarring in the lateral right base, not significantly changed. No radiographic evidence of acute cardiopulmonary process. Electronically Signed   By: Valetta Mole M.D.   On: 08/06/2021 08:32   CT Angio Chest PE W and/or Wo Contrast  Result Date: 08/06/2021 CLINICAL DATA:  Pulmonary embolism (PE) suspected, high prob; shortness of breath, no fever EXAM: CT ANGIOGRAPHY CHEST WITH CONTRAST TECHNIQUE: Multidetector CT imaging of the chest was performed using the standard protocol during bolus administration of intravenous contrast. Multiplanar CT image reconstructions and MIPs were obtained to evaluate the vascular anatomy. CONTRAST:  66mL OMNIPAQUE IOHEXOL 350 MG/ML SOLN COMPARISON:  None. FINDINGS: Cardiovascular: Satisfactory opacification of the pulmonary arteries to the segmental level. No evidence of pulmonary embolism. Cardiomegaly. No pericardial effusion. Coronary artery calcification. Aortic atherosclerosis. Mediastinum/Nodes: Mildly enlarged mediastinal nodes. For example, pretracheal node measuring 1 cm short axis (series 5, image 116). Prevascular node measuring 1 cm short axis (image 142). Lungs/Pleura: Imaging during expiration. Peribronchial thickening. There is mucous  plugging in the lower lobes. Patchy ground-glass density. Subsegmental atelectasis/scarring at the lung bases. Subpleural cystic change at the lung bases. Small right pleural effusion. No pneumothorax. Upper Abdomen: No acute abnormality. Musculoskeletal: Decreased osseous mineralization. No acute abnormality. Review of the MIP images confirms the above findings. IMPRESSION: No evidence of acute pulmonary embolism. Lower lobe mucous plugging. Peribronchial thickening, which is probably chronic. Patchy ground-glass density may reflect atelectasis, mild edema, or a combination. Cardiomegaly.  Coronary artery and aortic atherosclerosis. Electronically Signed   By: Macy Mis M.D.   On: 08/06/2021 09:44   CARDIAC CATHETERIZATION  Result Date: 08/07/2021   1st Diag-1 lesion is 20% stenosed.   Non-stenotic Ost LAD to Mid LAD lesion was previously treated.   Non-stenotic 1st Diag-2 lesion was previously treated. 1.  Widely patent LAD/diagonal stents with no significant restenosis.  There is mild ostial stenosis in the first diagonal with somewhat hazy appearance but this does not appear to be  flow-limiting. 2.  Mildly elevated left ventricular end-diastolic pressure at 19 mmHg.  Left ventricular angiography was not performed. Recommendations: Continue dual antiplatelet therapy. Resume warfarin tonight. Resume heparin 6 hours after sheath pull and the patient can be transitioned back to low molecular weight heparin when ready for discharge.     Scheduled Meds:  vitamin C  500 mg Oral Daily   aspirin EC  81 mg Oral Daily   calcium-vitamin D  1 tablet Oral Daily   cholecalciferol  1,000 Units Oral Daily   clopidogrel  75 mg Oral Daily   diltiazem  180 mg Oral Daily   dorzolamide  1 drop Both Eyes BID   ezetimibe  10 mg Oral Daily   furosemide  40 mg Oral Daily   isosorbide mononitrate  30 mg Oral Daily   latanoprost  1 drop Both Eyes QHS   magnesium oxide  400 mg Oral Daily   methylPREDNISolone  (SOLU-MEDROL) injection  80 mg Intravenous Q24H   multivitamin-lutein  2 capsule Oral Daily   pregabalin  50 mg Oral BID   rosuvastatin  20 mg Oral Daily   sodium chloride flush  3 mL Intravenous Q12H   sodium chloride flush  3 mL Intravenous Q12H   vitamin E  200 Units Oral Daily   Warfarin - Pharmacist Dosing Inpatient   Does not apply q1600   Continuous Infusions:  sodium chloride     heparin 850 Units/hr (08/07/21 1607)     LOS: 2 days     Enzo Bi, MD [facility] If 7PM-7AM, please contact night-coverage 08/08/2021, 12:24 AM

## 2021-08-07 NOTE — Interval H&P Note (Signed)
History and Physical Interval Note:  08/07/2021 7:38 AM  Molly Hodges  has presented today for surgery, with the diagnosis of nstemi.  The various methods of treatment have been discussed with the patient and family. After consideration of risks, benefits and other options for treatment, the patient has consented to  Procedure(s): LEFT HEART CATH AND CORONARY ANGIOGRAPHY (N/A) as a surgical intervention.  The patient's history has been reviewed, patient examined, no change in status, stable for surgery.  I have reviewed the patient's chart and labs.  Questions were answered to the patient's satisfaction.     Kathlyn [place]

## 2021-08-07 NOTE — Consult Note (Signed)
[place] for Heparin + Warfarin Indication: chest pain/ACS  Allergies  Allergen Reactions   Shellfish Allergy Anaphylaxis    Other reaction(s): Hallucination   Azithromycin Other (See Comments)    Extreme burning sensation at IV site    Tamiflu [Oseltamivir Phosphate] Other (See Comments)    Reaction:  Hallucinations     Albuterol Palpitations    Heart racing.     Patient Measurements: Height: 5\' 5"  (165.1 cm) Weight: 55.7 kg (122 lb 12.7 oz) IBW/kg (Calculated) : 57 Heparin Dosing Weight: 58.5kg  Vital Signs: Temp: 97.7 F (36.5 C) (12/21 0722) Temp Source: Oral (12/21 0722) BP: 102/65 (12/21 0900) Pulse Rate: 71 (12/21 0900)  Labs: Recent Labs    08/06/21 0802 08/06/21 0909 08/06/21 1030 08/06/21 1219 08/06/21 2144 08/07/21 0449  HGB 10.7*  --   --   --   --  9.7*  HCT 33.7*  --   --   --   --  28.9*  PLT 206  --   --   --   --  222  APTT  --  40*  --   --   --   --   LABPROT  --  14.7  --   --   --  14.2  INR  --  1.1  --   --   --  1.1  HEPARINUNFRC  --   --   --  0.35 0.25*  --   CREATININE 0.72  --   --   --   --  0.63  TROPONINIHS 1,505*  --  1,284* 1,236*  --   --      Estimated Creatinine Clearance: 43.6 mL/min (by C-G formula based on SCr of 0.63 mg/dL).   Medical History: Past Medical History:  Diagnosis Date   Aortic atherosclerosis ([place])    a. 05/2021 TEE: GrIII atheroma plaque involving the asc, transverse, and desc Ao.   Asthma    CAD (coronary artery disease)    a. 04/2021 Cath: LM nl, LAD 85p/m, D1 80, RI nl, LCX nl, RCA nl; b. 07/2021 PCI: pLAD (2.75x26 Onyx Frontier DES), D1 (2.5x22 Onyx Frontier DES).   Carotid artery disease ([place])    a. s/p R carotid stenting (33mm x 11mm x 4cm long Exact stent); b. 06/2021 U/S: RICA 76-19%, LICA 50-93%.   Community acquired pneumonia    Essential hypertension    Glaucoma    Hyperlipidemia    Mitral regurgitation    a. TTE 08/2015: EF 60-65%, normal wall  motion, mild MR, mildly dilated left atrium measuring 40 mm, RVSF normal, PASP normal; b. 05/2021 TEE: Moderate MR.   Mitral stenosis    a. 05/2021 L/RHC: Sev MS w/ mean grad 13-61mmHg and MV area of 0.5-06.cm^2; b. 05/2021 TEE: EF 55-60%, no rwma, nl RV fxn, mod MR, mod MS (MV area by P1/2t: 1.61 cm^2 w/ mean grad of 22mmHg).   Peripheral neuropathy    Persistent atrial fibrillation ([place]) 09/17/2015   a. s/p DCCV 11/15/2015; b. CHADS2VASc => 4 (HTN, age x 2, female)--> warfarin; c. 06/2021 recurrent afib-->amio added.    Medications:  Medications Prior to Admission  Medication Sig Dispense Refill Last Dose   albuterol (VENTOLIN HFA) 108 (90 Base) MCG/ACT inhaler Inhale 2 puffs into the lungs every 6 (six) hours as needed for wheezing or shortness of breath. 8 g 2 08/06/2021   aspirin EC 81 MG tablet Take 81 mg by mouth daily. Swallow whole.  08/05/2021   Calcium Carbonate-Vitamin D 600-400 MG-UNIT tablet Take 1 tablet by mouth daily.   08/05/2021   cholecalciferol (VITAMIN D) 1000 UNITS tablet Take 1,000 Units by mouth daily.   08/05/2021   clopidogrel (PLAVIX) 75 MG tablet Take 1 tablet (75 mg total) by mouth daily. 90 tablet 3 08/05/2021   diltiazem (CARDIZEM CD) 180 MG 24 hr capsule TAKE 1 CAPSULE EVERY DAY 30 capsule 11 08/05/2021   dorzolamide (TRUSOPT) 2 % ophthalmic solution Place 1 drop into both eyes 2 (two) times daily.    08/05/2021   enoxaparin (LOVENOX) 60 MG/0.6ML injection Inject 0.6 mLs (60 mg total) into the skin every 12 (twelve) hours for 5 days. 6 mL 0 08/05/2021 at 2200   ezetimibe (ZETIA) 10 MG tablet TAKE 1 TABLET BY MOUTH DAILY. 90 tablet 3 08/05/2021   furosemide (LASIX) 40 MG tablet Take 1 tablet (40 mg total) by mouth daily. 30 tablet 5 08/05/2021   ipratropium-albuterol (DUONEB) 0.5-2.5 (3) MG/3ML SOLN Take 3 mLs by nebulization every 4 (four) hours as needed. 360 mL 1 08/06/2021   isosorbide mononitrate (IMDUR) 30 MG 24 hr tablet Take 1 tablet (30 mg total) by mouth  daily. 90 tablet 3 08/05/2021   latanoprost (XALATAN) 0.005 % ophthalmic solution Place 1 drop into both eyes at bedtime.    08/05/2021   magnesium oxide (MAG-OX) 400 MG tablet Take 400 mg by mouth daily.   08/05/2021   Multiple Vitamins-Minerals (PRESERVISION AREDS 2+MULTI VIT) CAPS Take 2 capsules by mouth daily.   08/05/2021   potassium chloride (KLOR-CON) 10 MEQ tablet TAKE 1 TABLET BY MOUTH DAILY 90 tablet 0 08/05/2021   pregabalin (LYRICA) 50 MG capsule TAKE 1 CAPSULE BY MOUTH 2 TIMES DAILY 60 capsule 5 08/05/2021   rosuvastatin (CRESTOR) 20 MG tablet TAKE 1 TABLET BY MOUTH DAILY. 90 tablet 3 08/05/2021   vitamin C (ASCORBIC ACID) 500 MG tablet Take 500 mg by mouth daily.   08/05/2021   vitamin E 100 UNIT capsule Take 200 Units by mouth daily.   08/05/2021   warfarin (COUMADIN) 2.5 MG tablet Take 1 tablet (2.5 mg total) by mouth daily. Take as directed by the anti-coag clinic 30 tablet 1 08/05/2021 at am   nitroGLYCERIN (NITROSTAT) 0.4 MG SL tablet Place 1 tablet (0.4 mg total) under the tongue every 5 (five) minutes as needed (for chest pain or shortness of breath). 100 tablet 3 prn at prn    Assessment: Patient admitted to ER with SOB. PMH includes CAD, HTN, mitral stenosis, and A.Fib (on warfarin PTA). Noted elevated troponin and subtherapeutic INR. S/p cath 12/21. Resuming heparin 6 hours post cath at previous rate and restarting home warfarin for afib. Home dose: warfarin 2.5 mg daily.   Date INR Warfarin Dose  12/21 1.1 5 mg         Goal of Therapy:  Heparin level 0.3-0.7 units/ml Monitor platelets by anticoagulation protocol: Yes   Plan:  Heparin infusion to restart at 3 pm. Will start heparin infusion 850 units/hr. Check heparin level 8 hours post infusion start. CBC daily while on heparin.   INR is subtherapeutic. Will give warfarin 5 mg x 1 tonight (loading dose) to help get INR therapeutic. Can resume home dose tomorrow. Daily INR ordered.    Oswald Hillock, PharmD,  BCPS Clinical Pharmacist   08/07/2021 9:17 AM

## 2021-08-07 NOTE — TOC Initial Note (Signed)
Transition of Care [facility]) - Initial/Assessment Note    Patient Details  Name: Molly Hodges MRN: [id number] Date of Birth: 1932-05-11  Transition of Care [facility]) CM/SW Contact:    Eileen Stanford, LCSW Phone Number: 08/07/2021, 1:29 PM  Clinical Narrative: CSW spoke with pt to complete high risk readmission screening. Pt states she lives with her spouse. Pt states her family member and church friends take her to doctor appointments. Pt states she still sees Dr Caryn Section for primary care. Pt states she uses [facility] to get her meds. Pt states she currently does not get any in home services. CSW states we will wait to see what PT recommends. Pt states she has had [facility] before but does not remember the agency name.                  Expected Discharge Plan: Home/Self Care Barriers to Discharge: Continued Medical Work up   Patient Goals and CMS Choice Patient states their goals for this hospitalization and ongoing recovery are:: to continue to get better CMS Medicare.gov Compare Post Acute Care list provided to:: Patient    Expected Discharge Plan and Services Expected Discharge Plan: Home/Self Care In-house Referral: Clinical Social Work   Post Acute Care Choice: NA Living arrangements for the past 2 months: Single Family Home                                      Prior Living Arrangements/Services Living arrangements for the past 2 months: Single Family Home Lives with:: Spouse Patient language and need for interpreter reviewed:: Yes Do you feel safe going back to the place where you live?: Yes      Need for Family Participation in Patient Care: Yes (Comment) Care giver support system in place?: Yes (comment)   Criminal Activity/Legal Involvement Pertinent to Current Situation/Hospitalization: No - Comment as needed  Activities of Daily Living Home Assistive Devices/Equipment: Grab bars in shower, Raised toilet seat with rails, Walker (specify type) ADL Screening  (condition at time of admission) Patient's cognitive ability adequate to safely complete daily activities?: Yes Is the patient deaf or have difficulty hearing?: No Does the patient have difficulty seeing, even when wearing glasses/contacts?: No Does the patient have difficulty concentrating, remembering, or making decisions?: No Patient able to express need for assistance with ADLs?: Yes Does the patient have difficulty dressing or bathing?: No Independently performs ADLs?: Yes (appropriate for developmental age) Does the patient have difficulty walking or climbing stairs?: Yes Weakness of Legs: None Weakness of Arms/Hands: None  Permission Sought/Granted Permission sought to share information with : Family Supports Permission granted to share information with : Yes, Verbal Permission Granted  Share Information with NAME: howard     Permission granted to share info w Relationship: spouse     Emotional Assessment Appearance:: Appears stated age Attitude/Demeanor/Rapport: Engaged Affect (typically observed): Accepting Orientation: : Oriented to Situation, Oriented to  Time, Oriented to Place, Oriented to Self Alcohol / Substance Use: Not Applicable Psych Involvement: No (comment)  Admission diagnosis:  NSTEMI (non-ST elevated myocardial infarction) [facility]) [I21.4] Patient Active Problem List   Diagnosis Date Noted   NSTEMI (non-ST elevated myocardial infarction) ([place]) 08/06/2021   COPD exacerbation ([place]) 08/06/2021   Chronic diastolic CHF (congestive heart failure) ([place]) 08/06/2021   CAD (coronary artery disease)    Abnormal LFTs    Acute on chronic diastolic CHF (congestive heart  failure) ([place])    Hypokalemia 08/03/2021   Coronary artery disease with stable angina pectoris ([place]) 08/02/2021   Coronary artery disease of native artery of native heart with stable angina pectoris ([place]) 08/02/2021   Moderate mitral regurgitation 06/17/2021   Mitral valve stenosis 05/17/2021   Dyspnea  01/01/2021   Chronic respiratory failure with hypoxia ([place]) 01/01/2021   PAD (peripheral artery disease) ([place]) 11/13/2020   Hypochromic microcytic anemia 09/17/2020   Hyperlipidemia 09/17/2020   Paroxysmal atrial fibrillation ([place]) 08/30/2020   Carotid stenosis, right 08/15/2020   Carotid stenosis 07/17/2020   Meningioma ([place]) 07/04/2020   Cyst of thyroid 07/03/2020   Hypothyroidism 07/03/2020   Long-term use of high-risk medication 11/11/2016   COPD with acute bronchitis ([place]) 11/11/2016   Lower extremity edema 05/29/2016   Persistent atrial fibrillation ([facility])    Elevated troponin    Hypoxia    Encounter for anticoagulation discussion and counseling    Premature ventricular contraction 03/26/2015   Vitamin D deficiency 03/26/2015   Peripheral neuropathy 03/26/2015   Hypertension 03/26/2015   Asthma exacerbation 03/26/2015   PCP:  Birdie Sons, MD Pharmacy:   [place], [place] - [place] [address] Phone: [phone] Fax: [phone]     Social Determinants of Health (SDOH) Interventions    Readmission Risk Interventions Readmission Risk Prevention Plan 08/07/2021  Transportation Screening Complete  PCP or Specialist Appt within 3-5 Days Complete  HRI or [place] Complete  Social Work Consult for [place] Planning/Counseling Complete  Palliative Care Screening Not Applicable  Medication Review Press photographer) Complete  Some recent data might be hidden

## 2021-08-08 DIAGNOSIS — J441 Chronic obstructive pulmonary disease with (acute) exacerbation: Secondary | ICD-10-CM

## 2021-08-08 DIAGNOSIS — I739 Peripheral vascular disease, unspecified: Secondary | ICD-10-CM

## 2021-08-08 DIAGNOSIS — I25118 Atherosclerotic heart disease of native coronary artery with other forms of angina pectoris: Secondary | ICD-10-CM

## 2021-08-08 DIAGNOSIS — E782 Mixed hyperlipidemia: Secondary | ICD-10-CM

## 2021-08-08 LAB — MAGNESIUM: Magnesium: 2.4 mg/dL (ref 1.7–2.4)

## 2021-08-08 LAB — RESPIRATORY PANEL BY PCR

## 2021-08-08 LAB — BASIC METABOLIC PANEL
Anion gap: 8 (ref 5–15)
BUN: 22 mg/dL (ref 8–23)
CO2: 25 mmol/L (ref 22–32)
Calcium: 8.3 mg/dL — ABNORMAL LOW (ref 8.9–10.3)
Chloride: 95 mmol/L — ABNORMAL LOW (ref 98–111)
Creatinine, Ser: 0.65 mg/dL (ref 0.44–1.00)
GFR, Estimated: >60 mL/min (ref >60)
Glucose, Bld: 179 mg/dL — ABNORMAL HIGH (ref 70–99)
Potassium: 4.1 mmol/L (ref 3.5–5.1)
Sodium: 128 mmol/L — ABNORMAL LOW (ref 135–145)

## 2021-08-08 LAB — CBC
HCT: 30.9 % — ABNORMAL LOW (ref 36.0–46.0)
Hemoglobin: 10 g/dL — ABNORMAL LOW (ref 12.0–15.0)
MCH: 24.3 pg — ABNORMAL LOW (ref 26.0–34.0)
MCHC: 32.4 g/dL (ref 30.0–36.0)
MCV: 75 fL — ABNORMAL LOW (ref 80.0–100.0)
Platelets: 253 10*3/uL (ref 150–400)
RBC: 4.12 MIL/uL (ref 3.87–5.11)
RDW: 15.9 % — ABNORMAL HIGH (ref 11.5–15.5)
WBC: 17.5 10*3/uL — ABNORMAL HIGH (ref 4.0–10.5)
nRBC: 0 % (ref 0.0–0.2)

## 2021-08-08 LAB — HEPARIN LEVEL (UNFRACTIONATED)
Heparin Unfractionated: 0.22 IU/mL — ABNORMAL LOW (ref 0.30–0.70)
Heparin Unfractionated: 0.3 IU/mL (ref 0.30–0.70)
Heparin Unfractionated: 0.38 IU/mL (ref 0.30–0.70)

## 2021-08-08 LAB — PROTIME-INR
INR: 1.1 (ref 0.8–1.2)
Prothrombin Time: 14.1 seconds (ref 11.4–15.2)

## 2021-08-08 MED ORDER — PREDNISONE 20 MG PO TABS
40.0000 mg | ORAL_TABLET | Freq: Every day | ORAL | Status: DC
  Administered 2021-08-08 – 2021-08-09 (×2): 40 mg via ORAL
  Filled 2021-08-08 (×2): qty 2

## 2021-08-08 MED ORDER — HEPARIN BOLUS VIA INFUSION
1000.0000 [IU] | Freq: Once | INTRAVENOUS | Status: AC
  Administered 2021-08-08: 01:00:00 1000 [IU] via INTRAVENOUS
  Filled 2021-08-08: qty 1000

## 2021-08-08 MED ORDER — IPRATROPIUM-ALBUTEROL 0.5-2.5 (3) MG/3ML IN SOLN
3.0000 mL | RESPIRATORY_TRACT | Status: DC
  Administered 2021-08-08 – 2021-08-09 (×4): 3 mL via RESPIRATORY_TRACT
  Filled 2021-08-08 (×4): qty 3

## 2021-08-08 MED ORDER — WARFARIN SODIUM 2.5 MG PO TABS
2.5000 mg | ORAL_TABLET | Freq: Once | ORAL | Status: AC
  Administered 2021-08-08: 16:00:00 2.5 mg via ORAL
  Filled 2021-08-08: qty 1

## 2021-08-08 MED ORDER — FUROSEMIDE 10 MG/ML IJ SOLN
20.0000 mg | Freq: Once | INTRAMUSCULAR | Status: AC
  Administered 2021-08-08: 11:00:00 20 mg via INTRAVENOUS
  Filled 2021-08-08: qty 2

## 2021-08-08 NOTE — Consult Note (Signed)
[place] for Heparin + Warfarin Indication: chest pain/ACS  Allergies  Allergen Reactions   Shellfish Allergy Anaphylaxis    Other reaction(s): Hallucination   Azithromycin Other (See Comments)    Extreme burning sensation at IV site    Tamiflu [Oseltamivir Phosphate] Other (See Comments)    Reaction:  Hallucinations     Albuterol Palpitations    Heart racing.     Patient Measurements: Height: 5\' 5"  (165.1 cm) Weight: 55.7 kg (122 lb 12.7 oz) IBW/kg (Calculated) : 57 Heparin Dosing Weight: 58.5kg  Vital Signs: Temp: 98.1 F (36.7 C) (12/21 2003) Temp Source: Oral (12/21 2003) BP: 146/56 (12/21 2003) Pulse Rate: 78 (12/21 2003)  Labs: Recent Labs    08/06/21 0802 08/06/21 0909 08/06/21 1030 08/06/21 1219 08/06/21 2144 08/07/21 0449 08/08/21 0005  HGB 10.7*  --   --   --   --  9.7*  --   HCT 33.7*  --   --   --   --  28.9*  --   PLT 206  --   --   --   --  222  --   APTT  --  40*  --   --   --   --   --   LABPROT  --  14.7  --   --   --  14.2  --   INR  --  1.1  --   --   --  1.1  --   HEPARINUNFRC  --   --   --  0.35 0.25*  --  0.22*  CREATININE 0.72  --   --   --   --  0.63  --   TROPONINIHS 1,505*  --  1,284* 1,236*  --   --   --      Estimated Creatinine Clearance: 43.6 mL/min (by C-G formula based on SCr of 0.63 mg/dL).   Medical History: Past Medical History:  Diagnosis Date   Aortic atherosclerosis ([place])    a. 05/2021 TEE: GrIII atheroma plaque involving the asc, transverse, and desc Ao.   Asthma    CAD (coronary artery disease)    a. 04/2021 Cath: LM nl, LAD 85p/m, D1 80, RI nl, LCX nl, RCA nl; b. 07/2021 PCI: pLAD (2.75x26 Onyx Frontier DES), D1 (2.5x22 Onyx Frontier DES).   Carotid artery disease ([place])    a. s/p R carotid stenting (33mm x 80mm x 4cm long Exact stent); b. 06/2021 U/S: RICA 55-73%, LICA 22-02%.   Community acquired pneumonia    Essential hypertension    Glaucoma    Hyperlipidemia     Mitral regurgitation    a. TTE 08/2015: EF 60-65%, normal wall motion, mild MR, mildly dilated left atrium measuring 40 mm, RVSF normal, PASP normal; b. 05/2021 TEE: Moderate MR.   Mitral stenosis    a. 05/2021 L/RHC: Sev MS w/ mean grad 13-35mmHg and MV area of 0.5-06.cm^2; b. 05/2021 TEE: EF 55-60%, no rwma, nl RV fxn, mod MR, mod MS (MV area by P1/2t: 1.61 cm^2 w/ mean grad of 9mmHg).   Peripheral neuropathy    Persistent atrial fibrillation ([place]) 09/17/2015   a. s/p DCCV 11/15/2015; b. CHADS2VASc => 4 (HTN, age x 2, female)--> warfarin; c. 06/2021 recurrent afib-->amio added.    Medications:  Medications Prior to Admission  Medication Sig Dispense Refill Last Dose   albuterol (VENTOLIN HFA) 108 (90 Base) MCG/ACT inhaler Inhale 2 puffs into the lungs every 6 (six) hours as needed  for wheezing or shortness of breath. 8 g 2 08/06/2021   aspirin EC 81 MG tablet Take 81 mg by mouth daily. Swallow whole.   08/05/2021   Calcium Carbonate-Vitamin D 600-400 MG-UNIT tablet Take 1 tablet by mouth daily.   08/05/2021   cholecalciferol (VITAMIN D) 1000 UNITS tablet Take 1,000 Units by mouth daily.   08/05/2021   clopidogrel (PLAVIX) 75 MG tablet Take 1 tablet (75 mg total) by mouth daily. 90 tablet 3 08/05/2021   diltiazem (CARDIZEM CD) 180 MG 24 hr capsule TAKE 1 CAPSULE EVERY DAY 30 capsule 11 08/05/2021   dorzolamide (TRUSOPT) 2 % ophthalmic solution Place 1 drop into both eyes 2 (two) times daily.    08/05/2021   enoxaparin (LOVENOX) 60 MG/0.6ML injection Inject 0.6 mLs (60 mg total) into the skin every 12 (twelve) hours for 5 days. 6 mL 0 08/05/2021 at 2200   ezetimibe (ZETIA) 10 MG tablet TAKE 1 TABLET BY MOUTH DAILY. 90 tablet 3 08/05/2021   furosemide (LASIX) 40 MG tablet Take 1 tablet (40 mg total) by mouth daily. 30 tablet 5 08/05/2021   ipratropium-albuterol (DUONEB) 0.5-2.5 (3) MG/3ML SOLN Take 3 mLs by nebulization every 4 (four) hours as needed. 360 mL 1 08/06/2021   isosorbide mononitrate  (IMDUR) 30 MG 24 hr tablet Take 1 tablet (30 mg total) by mouth daily. 90 tablet 3 08/05/2021   latanoprost (XALATAN) 0.005 % ophthalmic solution Place 1 drop into both eyes at bedtime.    08/05/2021   magnesium oxide (MAG-OX) 400 MG tablet Take 400 mg by mouth daily.   08/05/2021   Multiple Vitamins-Minerals (PRESERVISION AREDS 2+MULTI VIT) CAPS Take 2 capsules by mouth daily.   08/05/2021   potassium chloride (KLOR-CON) 10 MEQ tablet TAKE 1 TABLET BY MOUTH DAILY 90 tablet 0 08/05/2021   pregabalin (LYRICA) 50 MG capsule TAKE 1 CAPSULE BY MOUTH 2 TIMES DAILY 60 capsule 5 08/05/2021   rosuvastatin (CRESTOR) 20 MG tablet TAKE 1 TABLET BY MOUTH DAILY. 90 tablet 3 08/05/2021   vitamin C (ASCORBIC ACID) 500 MG tablet Take 500 mg by mouth daily.   08/05/2021   vitamin E 100 UNIT capsule Take 200 Units by mouth daily.   08/05/2021   warfarin (COUMADIN) 2.5 MG tablet Take 1 tablet (2.5 mg total) by mouth daily. Take as directed by the anti-coag clinic 30 tablet 1 08/05/2021 at am   nitroGLYCERIN (NITROSTAT) 0.4 MG SL tablet Place 1 tablet (0.4 mg total) under the tongue every 5 (five) minutes as needed (for chest pain or shortness of breath). 100 tablet 3 prn at prn    Assessment: Patient admitted to ER with SOB. PMH includes CAD, HTN, mitral stenosis, and A.Fib (on warfarin PTA). Noted elevated troponin and subtherapeutic INR. S/p cath 12/21. Resuming heparin 6 hours post cath at previous rate and restarting home warfarin for afib. Home dose: warfarin 2.5 mg daily.   Date INR Warfarin Dose  12/21 1.1 5 mg         Goal of Therapy:  Heparin level 0.3-0.7 units/ml Monitor platelets by anticoagulation protocol: Yes   Plan:  12/22 :  HL @ 0005 = 0.22, subtherapeutic  Will order heparin 1000 units IV X 1 bolus and increase drip rate to 950 units/hr.  Will recheck HL 8 hrs after rate change.   Christapher Gillian D Clinical Pharmacist   08/08/2021 12:40 AM

## 2021-08-08 NOTE — Consult Note (Signed)
[place] for Heparin + Warfarin Indication: chest pain/ACS  Allergies  Allergen Reactions   Shellfish Allergy Anaphylaxis    Other reaction(s): Hallucination   Azithromycin Other (See Comments)    Extreme burning sensation at IV site    Tamiflu [Oseltamivir Phosphate] Other (See Comments)    Reaction:  Hallucinations     Albuterol Palpitations    Heart racing.     Patient Measurements: Height: 5\' 5"  (165.1 cm) Weight: 58.6 kg (129 lb 3 oz) IBW/kg (Calculated) : 57 Heparin Dosing Weight: 58.5kg  Vital Signs: Temp: 97.3 F (36.3 C) (12/22 0721) Temp Source: Oral (12/22 0059) BP: 129/58 (12/22 0721) Pulse Rate: 67 (12/22 0721)  Labs: Recent Labs    08/06/21 0802 08/06/21 0909 08/06/21 1030 08/06/21 1219 08/06/21 1219 08/06/21 2144 08/07/21 0449 08/08/21 0005 08/08/21 0436 08/08/21 0919  HGB 10.7*  --   --   --   --   --  9.7*  --  10.0*  --   HCT 33.7*  --   --   --   --   --  28.9*  --  30.9*  --   PLT 206  --   --   --   --   --  222  --  253  --   APTT  --  40*  --   --   --   --   --   --   --   --   LABPROT  --  14.7  --   --   --   --  14.2  --  14.1  --   INR  --  1.1  --   --   --   --  1.1  --  1.1  --   HEPARINUNFRC  --   --   --  0.35   < > 0.25*  --  0.22*  --  0.30  CREATININE 0.72  --   --   --   --   --  0.63  --  0.65  --   TROPONINIHS 1,505*  --  1,284* 1,236*  --   --   --   --   --   --    < > = values in this interval not displayed.     Estimated Creatinine Clearance: 44.6 mL/min (by C-G formula based on SCr of 0.65 mg/dL).   Medical History: Past Medical History:  Diagnosis Date   Aortic atherosclerosis ([place])    a. 05/2021 TEE: GrIII atheroma plaque involving the asc, transverse, and desc Ao.   Asthma    CAD (coronary artery disease)    a. 04/2021 Cath: LM nl, LAD 85p/m, D1 80, RI nl, LCX nl, RCA nl; b. 07/2021 PCI: pLAD (2.75x26 Onyx Frontier DES), D1 (2.5x22 Onyx Frontier DES).   Carotid  artery disease ([place])    a. s/p R carotid stenting (23mm x 92mm x 4cm long Exact stent); b. 06/2021 U/S: RICA 96-22%, LICA 29-79%.   Community acquired pneumonia    Essential hypertension    Glaucoma    Hyperlipidemia    Mitral regurgitation    a. TTE 08/2015: EF 60-65%, normal wall motion, mild MR, mildly dilated left atrium measuring 40 mm, RVSF normal, PASP normal; b. 05/2021 TEE: Moderate MR.   Mitral stenosis    a. 05/2021 L/RHC: Sev MS w/ mean grad 13-2mmHg and MV area of 0.5-06.cm^2; b. 05/2021 TEE: EF 55-60%, no rwma, nl RV fxn, mod  MR, mod MS (MV area by P1/2t: 1.61 cm^2 w/ mean grad of 74mmHg).   Peripheral neuropathy    Persistent atrial fibrillation ([place]) 09/17/2015   a. s/p DCCV 11/15/2015; b. CHADS2VASc => 4 (HTN, age x 2, female)--> warfarin; c. 06/2021 recurrent afib-->amio added.    Medications:  Medications Prior to Admission  Medication Sig Dispense Refill Last Dose   albuterol (VENTOLIN HFA) 108 (90 Base) MCG/ACT inhaler Inhale 2 puffs into the lungs every 6 (six) hours as needed for wheezing or shortness of breath. 8 g 2 08/06/2021   aspirin EC 81 MG tablet Take 81 mg by mouth daily. Swallow whole.   08/05/2021   Calcium Carbonate-Vitamin D 600-400 MG-UNIT tablet Take 1 tablet by mouth daily.   08/05/2021   cholecalciferol (VITAMIN D) 1000 UNITS tablet Take 1,000 Units by mouth daily.   08/05/2021   clopidogrel (PLAVIX) 75 MG tablet Take 1 tablet (75 mg total) by mouth daily. 90 tablet 3 08/05/2021   diltiazem (CARDIZEM CD) 180 MG 24 hr capsule TAKE 1 CAPSULE EVERY DAY 30 capsule 11 08/05/2021   dorzolamide (TRUSOPT) 2 % ophthalmic solution Place 1 drop into both eyes 2 (two) times daily.    08/05/2021   enoxaparin (LOVENOX) 60 MG/0.6ML injection Inject 0.6 mLs (60 mg total) into the skin every 12 (twelve) hours for 5 days. 6 mL 0 08/05/2021 at 2200   ezetimibe (ZETIA) 10 MG tablet TAKE 1 TABLET BY MOUTH DAILY. 90 tablet 3 08/05/2021   furosemide (LASIX) 40 MG tablet Take 1  tablet (40 mg total) by mouth daily. 30 tablet 5 08/05/2021   ipratropium-albuterol (DUONEB) 0.5-2.5 (3) MG/3ML SOLN Take 3 mLs by nebulization every 4 (four) hours as needed. 360 mL 1 08/06/2021   isosorbide mononitrate (IMDUR) 30 MG 24 hr tablet Take 1 tablet (30 mg total) by mouth daily. 90 tablet 3 08/05/2021   latanoprost (XALATAN) 0.005 % ophthalmic solution Place 1 drop into both eyes at bedtime.    08/05/2021   magnesium oxide (MAG-OX) 400 MG tablet Take 400 mg by mouth daily.   08/05/2021   Multiple Vitamins-Minerals (PRESERVISION AREDS 2+MULTI VIT) CAPS Take 2 capsules by mouth daily.   08/05/2021   potassium chloride (KLOR-CON) 10 MEQ tablet TAKE 1 TABLET BY MOUTH DAILY 90 tablet 0 08/05/2021   pregabalin (LYRICA) 50 MG capsule TAKE 1 CAPSULE BY MOUTH 2 TIMES DAILY 60 capsule 5 08/05/2021   rosuvastatin (CRESTOR) 20 MG tablet TAKE 1 TABLET BY MOUTH DAILY. 90 tablet 3 08/05/2021   vitamin C (ASCORBIC ACID) 500 MG tablet Take 500 mg by mouth daily.   08/05/2021   vitamin E 100 UNIT capsule Take 200 Units by mouth daily.   08/05/2021   warfarin (COUMADIN) 2.5 MG tablet Take 1 tablet (2.5 mg total) by mouth daily. Take as directed by the anti-coag clinic 30 tablet 1 08/05/2021 at am   nitroGLYCERIN (NITROSTAT) 0.4 MG SL tablet Place 1 tablet (0.4 mg total) under the tongue every 5 (five) minutes as needed (for chest pain or shortness of breath). 100 tablet 3 prn at prn    Assessment: Patient admitted to ER with SOB. PMH includes CAD, HTN, mitral stenosis, and A.Fib (on warfarin PTA). Noted elevated troponin and subtherapeutic INR. S/p cath 12/21. Resuming heparin 6 hours post cath at previous rate and restarting home warfarin for afib. Home dose: warfarin 2.5 mg daily.   12/22 0919 HL 0.3   Date INR Warfarin Dose  12/21 1.1 5 mg  12/22 1.1  2.5 mg     Goal of Therapy:  Heparin level 0.3-0.7 units/ml Monitor platelets by anticoagulation protocol: Yes   Plan:  Heparin level is  therapeutic but a the lower limit of goal. Will increase heparin infusion to 1000 units/hr. Recheck heparin level in 8 hours. CBC daily while on heparin.    INR is subtherapeutic. Will give warfarin 2.5 mg x 1 tonight (home dose). Pt received 2x the home dose on 12/21 to help get INR therapeutic. We will most likely we the effects of it tomorrow. Daily INR ordered.    Oswald Hillock, PharmD, BCPS Clinical Pharmacist   08/08/2021 10:19 AM

## 2021-08-08 NOTE — Consult Note (Signed)
[place] for Heparin + Warfarin Indication: chest pain/ACS  Allergies  Allergen Reactions   Shellfish Allergy Anaphylaxis    Other reaction(s): Hallucination   Azithromycin Other (See Comments)    Extreme burning sensation at IV site    Tamiflu [Oseltamivir Phosphate] Other (See Comments)    Reaction:  Hallucinations     Albuterol Palpitations    Heart racing.     Patient Measurements: Height: 5\' 5"  (165.1 cm) Weight: 58.6 kg (129 lb 3 oz) IBW/kg (Calculated) : 57 Heparin Dosing Weight: 58.5kg  Vital Signs: Temp: 97.5 F (36.4 C) (12/22 1512) Temp Source: Oral (12/22 1113) BP: 134/50 (12/22 1512) Pulse Rate: 62 (12/22 1512)  Labs: Recent Labs    08/06/21 0802 08/06/21 0909 08/06/21 1030 08/06/21 1219 08/06/21 2144 08/07/21 0449 08/08/21 0005 08/08/21 0436 08/08/21 0919 08/08/21 1651  HGB 10.7*  --   --   --   --  9.7*  --  10.0*  --   --   HCT 33.7*  --   --   --   --  28.9*  --  30.9*  --   --   PLT 206  --   --   --   --  222  --  253  --   --   APTT  --  40*  --   --   --   --   --   --   --   --   LABPROT  --  14.7  --   --   --  14.2  --  14.1  --   --   INR  --  1.1  --   --   --  1.1  --  1.1  --   --   HEPARINUNFRC  --   --   --  0.35   < >  --  0.22*  --  0.30 0.38  CREATININE 0.72  --   --   --   --  0.63  --  0.65  --   --   TROPONINIHS 1,505*  --  1,284* 1,236*  --   --   --   --   --   --    < > = values in this interval not displayed.     Estimated Creatinine Clearance: 44.6 mL/min (by C-G formula based on SCr of 0.65 mg/dL).   Medical History: Past Medical History:  Diagnosis Date   Aortic atherosclerosis ([place])    a. 05/2021 TEE: GrIII atheroma plaque involving the asc, transverse, and desc Ao.   Asthma    CAD (coronary artery disease)    a. 04/2021 Cath: LM nl, LAD 85p/m, D1 80, RI nl, LCX nl, RCA nl; b. 07/2021 PCI: pLAD (2.75x26 Onyx Frontier DES), D1 (2.5x22 Onyx Frontier DES).   Carotid  artery disease ([place])    a. s/p R carotid stenting (66mm x 47mm x 4cm long Exact stent); b. 06/2021 U/S: RICA 16-10%, LICA 96-04%.   Community acquired pneumonia    Essential hypertension    Glaucoma    Hyperlipidemia    Mitral regurgitation    a. TTE 08/2015: EF 60-65%, normal wall motion, mild MR, mildly dilated left atrium measuring 40 mm, RVSF normal, PASP normal; b. 05/2021 TEE: Moderate MR.   Mitral stenosis    a. 05/2021 L/RHC: Sev MS w/ mean grad 13-22mmHg and MV area of 0.5-06.cm^2; b. 05/2021 TEE: EF 55-60%, no rwma, nl RV fxn, mod  MR, mod MS (MV area by P1/2t: 1.61 cm^2 w/ mean grad of 67mmHg).   Peripheral neuropathy    Persistent atrial fibrillation ([place]) 09/17/2015   a. s/p DCCV 11/15/2015; b. CHADS2VASc => 4 (HTN, age x 2, female)--> warfarin; c. 06/2021 recurrent afib-->amio added.    Medications:  Medications Prior to Admission  Medication Sig Dispense Refill Last Dose   albuterol (VENTOLIN HFA) 108 (90 Base) MCG/ACT inhaler Inhale 2 puffs into the lungs every 6 (six) hours as needed for wheezing or shortness of breath. 8 g 2 08/06/2021   aspirin EC 81 MG tablet Take 81 mg by mouth daily. Swallow whole.   08/05/2021   Calcium Carbonate-Vitamin D 600-400 MG-UNIT tablet Take 1 tablet by mouth daily.   08/05/2021   cholecalciferol (VITAMIN D) 1000 UNITS tablet Take 1,000 Units by mouth daily.   08/05/2021   clopidogrel (PLAVIX) 75 MG tablet Take 1 tablet (75 mg total) by mouth daily. 90 tablet 3 08/05/2021   diltiazem (CARDIZEM CD) 180 MG 24 hr capsule TAKE 1 CAPSULE EVERY DAY 30 capsule 11 08/05/2021   dorzolamide (TRUSOPT) 2 % ophthalmic solution Place 1 drop into both eyes 2 (two) times daily.    08/05/2021   enoxaparin (LOVENOX) 60 MG/0.6ML injection Inject 0.6 mLs (60 mg total) into the skin every 12 (twelve) hours for 5 days. 6 mL 0 08/05/2021 at 2200   ezetimibe (ZETIA) 10 MG tablet TAKE 1 TABLET BY MOUTH DAILY. 90 tablet 3 08/05/2021   furosemide (LASIX) 40 MG tablet Take 1  tablet (40 mg total) by mouth daily. 30 tablet 5 08/05/2021   ipratropium-albuterol (DUONEB) 0.5-2.5 (3) MG/3ML SOLN Take 3 mLs by nebulization every 4 (four) hours as needed. 360 mL 1 08/06/2021   isosorbide mononitrate (IMDUR) 30 MG 24 hr tablet Take 1 tablet (30 mg total) by mouth daily. 90 tablet 3 08/05/2021   latanoprost (XALATAN) 0.005 % ophthalmic solution Place 1 drop into both eyes at bedtime.    08/05/2021   magnesium oxide (MAG-OX) 400 MG tablet Take 400 mg by mouth daily.   08/05/2021   Multiple Vitamins-Minerals (PRESERVISION AREDS 2+MULTI VIT) CAPS Take 2 capsules by mouth daily.   08/05/2021   potassium chloride (KLOR-CON) 10 MEQ tablet TAKE 1 TABLET BY MOUTH DAILY 90 tablet 0 08/05/2021   pregabalin (LYRICA) 50 MG capsule TAKE 1 CAPSULE BY MOUTH 2 TIMES DAILY 60 capsule 5 08/05/2021   rosuvastatin (CRESTOR) 20 MG tablet TAKE 1 TABLET BY MOUTH DAILY. 90 tablet 3 08/05/2021   vitamin C (ASCORBIC ACID) 500 MG tablet Take 500 mg by mouth daily.   08/05/2021   vitamin E 100 UNIT capsule Take 200 Units by mouth daily.   08/05/2021   warfarin (COUMADIN) 2.5 MG tablet Take 1 tablet (2.5 mg total) by mouth daily. Take as directed by the anti-coag clinic 30 tablet 1 08/05/2021 at am   nitroGLYCERIN (NITROSTAT) 0.4 MG SL tablet Place 1 tablet (0.4 mg total) under the tongue every 5 (five) minutes as needed (for chest pain or shortness of breath). 100 tablet 3 prn at prn    Assessment: Patient admitted to ER with SOB. PMH includes CAD, HTN, mitral stenosis, and A.Fib (on warfarin PTA). Noted elevated troponin and subtherapeutic INR. S/p cath 12/21. Resuming heparin 6 hours post cath at previous rate and restarting home warfarin for afib. Home dose: warfarin 2.5 mg daily.   12/22 0919 HL 0.3  12/22 1651 HL 0.38 @ 1000 units/hr  Date INR Warfarin Dose  12/21 1.1 5 mg  12/22 1.1 2.5 mg     Goal of Therapy:  Heparin level 0.3-0.7 units/ml Monitor platelets by anticoagulation protocol:  Yes   Plan:  Heparin level is therapeutic. Continue heparin infusion at 1000 units/hr. Recheck heparin level with AM labs CBC daily while on heparin.    INR is subtherapeutic. Will give warfarin 2.5 mg x 1 tonight (home dose). Pt received 2x the home dose on 12/21 to help get INR therapeutic. We will most likely we the effects of it tomorrow. Daily INR ordered.    Dorothe Pea, PharmD, BCPS Clinical Pharmacist   08/08/2021 5:26 PM

## 2021-08-08 NOTE — Progress Notes (Addendum)
PROGRESS NOTE    Molly Hodges  KVQ:[id number] DOB: Nov 23, 1931 DOA: 08/06/2021 PCP: Birdie Sons, MD  [phone]   Assessment & Plan:   Principal Problem:   NSTEMI (non-ST elevated myocardial infarction) [facility]) Active Problems:   Hypertension   Asthma exacerbation   Paroxysmal atrial fibrillation (HCC)   Hypothyroidism   Hyperlipidemia   PAD (peripheral artery disease) (HCC)   COPD exacerbation (HCC)   CAD (coronary artery disease)   Abnormal LFTs   Chronic diastolic CHF (congestive heart failure) (HCC)   Molly Hodges is a 87 y.o. female with medical history significant of COPD, asthma, hypertension, hyperlipidemia, hypothyroidism, atrial fibrillation on Coumadin, mitral valve prolapse, carotid artery stenosis, anemia, PVD, CAD, dCHF, who presents with shortness breath.   NSTEMI  Recent hx of CAD s/p of stent on 08/02/21 Trop  1505. No CP. Consulted Dr. Fletcher Anon of card. --heart cath with no obstruction found.   --s/p heparin gtt for 48 hours --cont ASA, plavix and coumadin for 1 months after stent placement.  Asthma exacerbation 2/2 Rhinovirus/enterovirus infection Had wheezing on presentation.  Patient does not have significant sputum production.  No infiltration on chest x-ray.  was not started on antibiotics. --started on IV solumedrol Plan: --cont prednisone 40 mg daily --DuoNeb scheduled   Hypertension --cont cardizem, lasix and Imdur   Paroxysmal atrial fibrillation (HCC) --cont cardizem --cont coumadin   Hypothyroidism -Synthroid   Hyperlipidemia -Crestor, Zetia   PAD (peripheral artery disease) (HCC) --cont ASA and plavix   Abnormal LFTs: Mild.   Not clinically significant. AST 53, ALT 55, total bilirubin 0.9, ALP 87   Chronic diastolic CHF (congestive heart failure) ([place]): 2D echo on 06/13/2021 showed EF of 55-80% with grade 3 diastolic dysfunction.  Patient has elevated BNP 555, but no JVD or leg edema.  Chest x-ray is negative for  edema.  Does not seem to have CHF exacerbation --cont home lasix  Hyponatremia, of unclear significance     DVT prophylaxis: CZ:[id number] gtt Code Status: Limited code  Family Communication: daughter updated at bedside today  Level of care: Progressive Dispo:   The patient is from: home Anticipated d/c is to: home Anticipated d/c date is: tomorrow   Subjective and Interval History:  Pt reported intermittent coughing spells that caused respiratory distress.  But overall, pt reported feeling better.    RVP pos for rhinoviurs/enterovirus.   Objective: Vitals:   08/08/21 0500 08/08/21 0721 08/08/21 1113 08/08/21 1512  BP:  (!) 129/58 126/61 (!) 134/50  Pulse:  67 67 62  Resp:  18 18 17   Temp:  (!) 97.3 F (36.3 C) (!) 97.5 F (36.4 C) (!) 97.5 F (36.4 C)  TempSrc:   Oral   SpO2:  98% 98% 97%  Weight: 58.6 kg     Height:        Intake/Output Summary (Last 24 hours) at 08/08/2021 1744 Last data filed at 08/08/2021 1553 Gross per 24 hour  Intake 401.94 ml  Output 1300 ml  Net -898.06 ml   Filed Weights   08/06/21 1706 08/07/21 0400 08/08/21 0500  Weight: 58.5 kg 55.7 kg 58.6 kg    Examination:   Constitutional: NAD, AAOx3 HEENT: conjunctivae and lids normal, EOMI CV: No cyanosis.   RESP: normal respiratory effort, diffuse wheezing, on RA Extremities: No effusions, edema in BLE SKIN: warm, dry Neuro: II - XII grossly intact.   Psych: Normal mood and affect.  Appropriate judgement and reason   Data Reviewed: I have personally reviewed  following labs and imaging studies  CBC: Recent Labs  Lab 08/03/21 0144 08/06/21 0802 08/07/21 0449 08/08/21 0436  WBC 10.2 9.3 10.3 17.5*  NEUTROABS  --  6.9  --   --   HGB 11.3* 10.7* 9.7* 10.0*  HCT 35.1* 33.7* 28.9* 30.9*  MCV 76.3* 76.2* 74.7* 75.0*  PLT 232 206 222 951   Basic Metabolic Panel: Recent Labs  Lab 08/03/21 0144 08/06/21 0802 08/07/21 0449 08/08/21 0436  NA 135 132* 130* 128*  K 3.1* 3.5 3.8  4.1  CL 100 97* 98 95*  CO2 25 25 26 25   GLUCOSE 110* 126* 192* 179*  BUN 9 10 15 22   CREATININE 0.76 0.72 0.63 0.65  CALCIUM 8.4* 8.4* 8.5* 8.3*  MG  --   --  2.4 2.4   GFR: Estimated Creatinine Clearance: 44.6 mL/min (by C-G formula based on SCr of 0.65 mg/dL). Liver Function Tests: Recent Labs  Lab 08/06/21 0802  AST 54*  ALT 55*  ALKPHOS 87  BILITOT 0.9  PROT 6.4*  ALBUMIN 3.3*   No results for input(s): LIPASE, AMYLASE in the last 168 hours. No results for input(s): AMMONIA in the last 168 hours. Coagulation Profile: Recent Labs  Lab 08/02/21 0703 08/03/21 0944 08/06/21 0909 08/07/21 0449 08/08/21 0436  INR 1.2 1.2 1.1 1.1 1.1   Cardiac Enzymes: No results for input(s): CKTOTAL, CKMB, CKMBINDEX, TROPONINI in the last 168 hours. BNP (last 3 results) No results for input(s): PROBNP in the last 8760 hours. HbA1C: Recent Labs    08/06/21 1219  HGBA1C 6.2*   CBG: No results for input(s): GLUCAP in the last 168 hours. Lipid Profile: Recent Labs    08/07/21 0449  CHOL 100  HDL 44  LDLCALC 47  TRIG 46  CHOLHDL 2.3   Thyroid Function Tests: No results for input(s): TSH, T4TOTAL, FREET4, T3FREE, THYROIDAB in the last 72 hours. Anemia Panel: No results for input(s): VITAMINB12, FOLATE, FERRITIN, TIBC, IRON, RETICCTPCT in the last 72 hours. Sepsis Labs: No results for input(s): PROCALCITON, LATICACIDVEN in the last 168 hours.  Recent Results (from the past 240 hour(s))  Resp Panel by RT-PCR (Flu A&B, Covid) Nasopharyngeal Swab     Status: None   Collection Time: 08/06/21  9:09 AM   Specimen: Nasopharyngeal Swab; Nasopharyngeal(NP) swabs in vial transport medium  Result Value Ref Range Status   SARS Coronavirus 2 by RT PCR NEGATIVE NEGATIVE Final    Comment: (NOTE) SARS-CoV-2 target nucleic acids are NOT DETECTED.  The SARS-CoV-2 RNA is generally detectable in upper respiratory specimens during the acute phase of infection. The lowest concentration of  SARS-CoV-2 viral copies this assay can detect is 138 copies/mL. A negative result does not preclude SARS-Cov-2 infection and should not be used as the sole basis for treatment or other patient management decisions. A negative result may occur with  improper specimen collection/handling, submission of specimen other than nasopharyngeal swab, presence of viral mutation(s) within the areas targeted by this assay, and inadequate number of viral copies(<138 copies/mL). A negative result must be combined with clinical observations, patient history, and epidemiological information. The expected result is Negative.  Fact Sheet for Patients:  EntrepreneurPulse.com.au  Fact Sheet for Healthcare Providers:  IncredibleEmployment.be  This test is no t yet approved or cleared by the [place] FDA and  has been authorized for detection and/or diagnosis of SARS-CoV-2 by FDA under an Emergency Use Authorization (EUA). This EUA will remain  in effect (meaning this test can be used) for  the duration of the COVID-19 declaration under Section 564(b)(1) of the Act, 21 U.S.C.section 360bbb-3(b)(1), unless the authorization is terminated  or revoked sooner.       Influenza A by PCR NEGATIVE NEGATIVE Final   Influenza B by PCR NEGATIVE NEGATIVE Final    Comment: (NOTE) The Xpert Xpress SARS-CoV-2/FLU/RSV plus assay is intended as an aid in the diagnosis of influenza from Nasopharyngeal swab specimens and should not be used as a sole basis for treatment. Nasal washings and aspirates are unacceptable for Xpert Xpress SARS-CoV-2/FLU/RSV testing.  Fact Sheet for Patients: EntrepreneurPulse.com.au  Fact Sheet for Healthcare Providers: IncredibleEmployment.be  This test is not yet approved or cleared by the [place] FDA and has been authorized for detection and/or diagnosis of SARS-CoV-2 by FDA under an Emergency Use  Authorization (EUA). This EUA will remain in effect (meaning this test can be used) for the duration of the COVID-19 declaration under Section 564(b)(1) of the Act, 21 U.S.C. section 360bbb-3(b)(1), unless the authorization is terminated or revoked.  Performed at [facility], [address] [place], [place] [address]       Radiology Studies: CARDIAC CATHETERIZATION  Result Date: 08/07/2021   1st Diag-1 lesion is 20% stenosed.   Non-stenotic Ost LAD to Mid LAD lesion was previously treated.   Non-stenotic 1st Diag-2 lesion was previously treated. 1.  Widely patent LAD/diagonal stents with no significant restenosis.  There is mild ostial stenosis in the first diagonal with somewhat hazy appearance but this does not appear to be flow-limiting. 2.  Mildly elevated left ventricular end-diastolic pressure at 19 mmHg.  Left ventricular angiography was not performed. Recommendations: Continue dual antiplatelet therapy. Resume warfarin tonight. Resume heparin 6 hours after sheath pull and the patient can be transitioned back to low molecular weight heparin when ready for discharge.     Scheduled Meds:  vitamin C  500 mg Oral Daily   aspirin EC  81 mg Oral Daily   calcium-vitamin D  1 tablet Oral Daily   cholecalciferol  1,000 Units Oral Daily   clopidogrel  75 mg Oral Daily   diltiazem  180 mg Oral Daily   dorzolamide  1 drop Both Eyes BID   ezetimibe  10 mg Oral Daily   furosemide  40 mg Oral Daily   ipratropium-albuterol  3 mL Nebulization Q4H   isosorbide mononitrate  30 mg Oral Daily   latanoprost  1 drop Both Eyes QHS   magnesium oxide  400 mg Oral Daily   multivitamin-lutein  2 capsule Oral Daily   predniSONE  40 mg Oral Q breakfast   pregabalin  50 mg Oral BID   rosuvastatin  20 mg Oral Daily   sodium chloride flush  3 mL Intravenous Q12H   sodium chloride flush  3 mL Intravenous Q12H   vitamin E  200 Units Oral Daily   Warfarin - Pharmacist Dosing Inpatient   Does  not apply q1600   Continuous Infusions:  sodium chloride     heparin 1,000 Units/hr (08/08/21 1040)     LOS: 2 days     Enzo Bi, MD [facility] If 7PM-7AM, please contact night-coverage 08/08/2021, 5:44 PM

## 2021-08-08 NOTE — Progress Notes (Signed)
Progress Note  Patient Name: Molly Hodges Date of Encounter: 08/08/2021  [facility] Cardiologist: Ida Rogue, MD   Subjective   Patient reports she is coughing despite medicine. Cath site stable. No chest pain.  Inpatient Medications    Scheduled Meds:  vitamin C  500 mg Oral Daily   aspirin EC  81 mg Oral Daily   calcium-vitamin D  1 tablet Oral Daily   cholecalciferol  1,000 Units Oral Daily   clopidogrel  75 mg Oral Daily   diltiazem  180 mg Oral Daily   dorzolamide  1 drop Both Eyes BID   ezetimibe  10 mg Oral Daily   furosemide  40 mg Oral Daily   isosorbide mononitrate  30 mg Oral Daily   latanoprost  1 drop Both Eyes QHS   magnesium oxide  400 mg Oral Daily   multivitamin-lutein  2 capsule Oral Daily   pregabalin  50 mg Oral BID   rosuvastatin  20 mg Oral Daily   sodium chloride flush  3 mL Intravenous Q12H   sodium chloride flush  3 mL Intravenous Q12H   vitamin E  200 Units Oral Daily   Warfarin - Pharmacist Dosing Inpatient   Does not apply q1600   Continuous Infusions:  sodium chloride     heparin 950 Units/hr (08/08/21 0919)   PRN Meds: sodium chloride, acetaminophen, albuterol, dextromethorphan-guaiFENesin, hydrALAZINE, ipratropium-albuterol, nitroGLYCERIN, ondansetron (ZOFRAN) IV, sodium chloride flush   Vital Signs    Vitals:   08/08/21 0059 08/08/21 0202 08/08/21 0500 08/08/21 0721  BP: (!) 141/75 (!) 155/59  (!) 129/58  Pulse: 69 75  67  Resp: 20 18  18   Temp: 97.8 F (36.6 C) (!) 97.3 F (36.3 C)  (!) 97.3 F (36.3 C)  TempSrc: Oral     SpO2: 97% 99%  98%  Weight:   58.6 kg   Height:        Intake/Output Summary (Last 24 hours) at 08/08/2021 1002 Last data filed at 08/08/2021 0919 Gross per 24 hour  Intake 661.78 ml  Output 500 ml  Net 161.78 ml   Last 3 Weights 08/08/2021 08/07/2021 08/06/2021  Weight (lbs) 129 lb 3 oz 122 lb 12.7 oz 128 lb 14.4 oz  Weight (kg) 58.6 kg 55.7 kg 58.469 kg      Telemetry    Afib  HR 70s - Personally Reviewed  ECG    No new - Personally Reviewed  Physical Exam   GEN: No acute distress.   Neck: No JVD Cardiac: Irreg Irreg, no murmurs, rubs, or gallops.  Respiratory: +wheezing GI: Soft, nontender, non-distended  MS: No edema; No deformity. Neuro:  Nonfocal  Psych: Normal affect   Labs    High Sensitivity Troponin:   Recent Labs  Lab 08/06/21 0802 08/06/21 1030 08/06/21 1219  TROPONINIHS 1,505* 1,284* 1,236*     Chemistry Recent Labs  Lab 08/06/21 0802 08/07/21 0449 08/08/21 0436  NA 132* 130* 128*  K 3.5 3.8 4.1  CL 97* 98 95*  CO2 25 26 25   GLUCOSE 126* 192* 179*  BUN 10 15 22   CREATININE 0.72 0.63 0.65  CALCIUM 8.4* 8.5* 8.3*  MG  --  2.4 2.4  PROT 6.4*  --   --   ALBUMIN 3.3*  --   --   AST 54*  --   --   ALT 55*  --   --   ALKPHOS 87  --   --   BILITOT 0.9  --   --  GFRNONAA >60 >60 >60  ANIONGAP 10 6 8     Lipids  Recent Labs  Lab 08/07/21 0449  CHOL 100  TRIG 46  HDL 44  LDLCALC 47  CHOLHDL 2.3    Hematology Recent Labs  Lab 08/06/21 0802 08/07/21 0449 08/08/21 0436  WBC 9.3 10.3 17.5*  RBC 4.42 3.87 4.12  HGB 10.7* 9.7* 10.0*  HCT 33.7* 28.9* 30.9*  MCV 76.2* 74.7* 75.0*  MCH 24.2* 25.1* 24.3*  MCHC 31.8 33.6 32.4  RDW 15.7* 15.7* 15.9*  PLT 206 222 253   Thyroid No results for input(s): TSH, FREET4 in the last 168 hours.  BNP Recent Labs  Lab 08/06/21 0802  BNP 555.3*    DDimer  Recent Labs  Lab 08/06/21 0802  DDIMER 12.17*     Radiology    CARDIAC CATHETERIZATION  Result Date: 08/07/2021   1st Diag-1 lesion is 20% stenosed.   Non-stenotic Ost LAD to Mid LAD lesion was previously treated.   Non-stenotic 1st Diag-2 lesion was previously treated. 1.  Widely patent LAD/diagonal stents with no significant restenosis.  There is mild ostial stenosis in the first diagonal with somewhat hazy appearance but this does not appear to be flow-limiting. 2.  Mildly elevated left ventricular end-diastolic  pressure at 19 mmHg.  Left ventricular angiography was not performed. Recommendations: Continue dual antiplatelet therapy. Resume warfarin tonight. Resume heparin 6 hours after sheath pull and the patient can be transitioned back to low molecular weight heparin when ready for discharge.    Cardiac Studies   LHC 08/07/21     1st Diag-1 lesion is 20% stenosed.   Non-stenotic Ost LAD to Mid LAD lesion was previously treated.   Non-stenotic 1st Diag-2 lesion was previously treated.   1.  Widely patent LAD/diagonal stents with no significant restenosis.  There is mild ostial stenosis in the first diagonal with somewhat hazy appearance but this does not appear to be flow-limiting. 2.  Mildly elevated left ventricular end-diastolic pressure at 19 mmHg.  Left ventricular angiography was not performed.   Recommendations: Continue dual antiplatelet therapy. Resume warfarin tonight. Resume heparin 6 hours after sheath pull and the patient can be transitioned back to low molecular weight heparin when ready for discharge.  Patient Profile     87 y.o. female  with a h/o history of CAD s/p recent LAD/D1 stenting 08/02/2021, persistent Afib on amio/warfarin, moderate mitral stenosis, moderate mitral regurgitation, HTN, HL, COPD (O2 @ HS), prior PNA, carotid dzs s/p R carotid stenting, Ao atherosclerosis, chronic dizziness and glaucoma, who is being seen today for the evaluation of dyspnea and elevated HsTrop  Assessment & Plan    NSTEMI CAD with recent prior stenting - recent stenting with DES to pLAD and diag on 12/16 - presented with dyspnea and wheezing - EKG without acute changes - HS trop 8850>2774 - CTA chest negative for PE.  - LHC showed patent stent, other wise non-stenotic lesions, mildly elevate LVEDP at 89mmHg - Echo TEE 05/2021 showed LVEF 55-60% - Continue medical management with Plavix, ASA, statin, zetia, Imdur. - Can stop ASA a month after stenting on 08/02/21  Acute respiratory  failure HFpEF COPD - LVEDP mildly elevated - she has been on home lasix 40mg  daily - will give IV lasix 20mg  daily - s/p solumedrol. may need to consider non-cardiac causes of breathing issues. Wheezing on exam.   Persistent Afib - resume warfarin post-procedure per pharmacy - rate controlled on diltizem 180mg  daily - PTA amiodarone held given persistent  afib  Moderate MR/moderate MS - recent TEE in October showed moderate mitral valve disease.   HLD - LDL 40 in January/2022 - continue statin and Zetia  For questions or updates, please contact [place] Please consult [email or web address] for contact info under        Signed, Nicholis Stepanek Ninfa Meeker, PA-C  08/08/2021, 10:02 AM

## 2021-08-09 LAB — CBC
HCT: 28.3 % — ABNORMAL LOW (ref 36.0–46.0)
Hemoglobin: 9.2 g/dL — ABNORMAL LOW (ref 12.0–15.0)
MCH: 24.3 pg — ABNORMAL LOW (ref 26.0–34.0)
MCHC: 32.5 g/dL (ref 30.0–36.0)
MCV: 74.9 fL — ABNORMAL LOW (ref 80.0–100.0)
Platelets: 252 10*3/uL (ref 150–400)
RBC: 3.78 MIL/uL — ABNORMAL LOW (ref 3.87–5.11)
RDW: 16 % — ABNORMAL HIGH (ref 11.5–15.5)
WBC: 13.2 10*3/uL — ABNORMAL HIGH (ref 4.0–10.5)
nRBC: 0 % (ref 0.0–0.2)

## 2021-08-09 LAB — PROTIME-INR
INR: 1.4 — ABNORMAL HIGH (ref 0.8–1.2)
Prothrombin Time: 17.1 seconds — ABNORMAL HIGH (ref 11.4–15.2)

## 2021-08-09 LAB — BASIC METABOLIC PANEL
Anion gap: 7 (ref 5–15)
BUN: 21 mg/dL (ref 8–23)
CO2: 26 mmol/L (ref 22–32)
Calcium: 8.1 mg/dL — ABNORMAL LOW (ref 8.9–10.3)
Chloride: 97 mmol/L — ABNORMAL LOW (ref 98–111)
Creatinine, Ser: 0.69 mg/dL (ref 0.44–1.00)
GFR, Estimated: >60 mL/min (ref >60)
Glucose, Bld: 161 mg/dL — ABNORMAL HIGH (ref 70–99)
Potassium: 3.9 mmol/L (ref 3.5–5.1)
Sodium: 130 mmol/L — ABNORMAL LOW (ref 135–145)

## 2021-08-09 LAB — HEPARIN LEVEL (UNFRACTIONATED): Heparin Unfractionated: 0.39 IU/mL (ref 0.30–0.70)

## 2021-08-09 LAB — MAGNESIUM: Magnesium: 2.3 mg/dL (ref 1.7–2.4)

## 2021-08-09 MED ORDER — PREDNISONE 20 MG PO TABS: 20.0000 mg | ORAL_TABLET | Freq: Every day | ORAL | 0 refills | Status: AC

## 2021-08-09 MED ORDER — ASPIRIN EC 81 MG PO TBEC: 81.0000 mg | DELAYED_RELEASE_TABLET | Freq: Every day | ORAL | Status: AC

## 2021-08-09 MED ORDER — WARFARIN SODIUM 2.5 MG PO TABS
2.5000 mg | ORAL_TABLET | Freq: Once | ORAL | Status: DC
  Filled 2021-08-09: qty 1

## 2021-08-09 MED ORDER — IPRATROPIUM-ALBUTEROL 0.5-2.5 (3) MG/3ML IN SOLN
3.0000 mL | Freq: Four times a day (QID) | RESPIRATORY_TRACT | Status: DC
  Administered 2021-08-09: 08:00:00 3 mL via RESPIRATORY_TRACT
  Filled 2021-08-09: qty 3

## 2021-08-09 MED ORDER — DM-GUAIFENESIN ER 30-600 MG PO TB12: 1.0000 | ORAL_TABLET | Freq: Two times a day (BID) | ORAL | Status: AC | PRN

## 2021-08-09 NOTE — Discharge Summary (Signed)
Physician Discharge Summary   Molly Hodges  female DOB: [birth year]  IDP:[id number]  PCP: Birdie Sons, MD  Admit date: 08/06/2021 Discharge date: 08/09/2021  Admitted From: home Disposition:  home CODE STATUS: Limited code  Discharge Instructions     Discharge instructions   Complete by: As directed    Your repeat cardiac cath found no blockage.  Please continue to take Aspirin 81 mg, plavix and warfarin together for 1 month after stent placement, and after that, just take Plavix and warfarin, per recommendation of your cardiologist.    You have rhinovirus upper respiratory infection which caused a mild asthma flare up.  Please continue taking 4 more days of prednisone 20 mg daily at home.  Continue DuoNeb as needed.   Dr. Enzo Bi [facility] Course:  For full details, please see H&P, progress notes, consult notes and ancillary notes.  Briefly,  Molly Hodges is a 87 y.o. female with medical history significant of COPD, asthma, hypertension, hyperlipidemia, hypothyroidism, atrial fibrillation on Coumadin, mitral valve prolapse, carotid artery stenosis, anemia, PVD, CAD, dCHF, who presents with shortness breath.   NSTEMI  Recent hx of CAD s/p of stent on 08/02/21 Trop 1505. No CP.   --heart cath with no obstruction found.   --s/p heparin gtt for 48 hours --cont ASA, plavix and coumadin for 1 months after initial stent placement.  After 1 month, just Plavix and warfarin, per recommendation of cardiologist.  --cont statin, Zetia, Imdur   Asthma exacerbation 2/2 Rhinovirus/enterovirus infection Had wheezing on presentation.  Patient does not have significant sputum production.  No infiltration on chest x-ray.  was not started on antibiotics. --started on IV solumedrol and transitioned to prednisone 40 mg daily.  Discharged on 4 more days of prednisone 20 mg daily at home.  --scheduled DuoNeb while inpatient, continue as PRN after discharge.    Hypertension --cont cardizem, lasix and Imdur   Paroxysmal atrial fibrillation (HCC) --cont cardizem --cont coumadin   Hypothyroidism -Synthroid   Hyperlipidemia -Crestor, Zetia   PAD (peripheral artery disease) (HCC) --cont ASA and plavix   Abnormal LFTs: Mild.   Not clinically significant. AST 53, ALT 55, total bilirubin 0.9, ALP 87   Chronic diastolic CHF (congestive heart failure) ([place]): 2D echo on 06/13/2021 showed EF of 55-80% with grade 3 diastolic dysfunction.  Patient has elevated BNP 555, but no JVD or leg edema.  Chest x-ray is negative for edema.  Does not seem to have CHF exacerbation --cont home lasix   Hyponatremia, of unclear significance    Discharge Diagnoses:  Principal Problem:   NSTEMI (non-ST elevated myocardial infarction) ([place]) Active Problems:   Hypertension   Asthma exacerbation   Paroxysmal atrial fibrillation (HCC)   Hypothyroidism   Hyperlipidemia   PAD (peripheral artery disease) (HCC)   COPD exacerbation (HCC)   CAD (coronary artery disease)   Abnormal LFTs   Chronic diastolic CHF (congestive heart failure) ([place])   30 Day Unplanned Readmission Risk Score    Flowsheet Row ED to Hosp-Admission (Current) from 08/06/2021 in [facility]  30 Day Unplanned Readmission Risk Score (%) 26.98 Filed at 08/09/2021 0801       This score is the patient's risk of an unplanned readmission within 30 days of being discharged (0 -100%). The score is based on dignosis, age, lab data, medications, orders, and past utilization.   Low:  0-14.9   Medium: 15-21.9   High: 22-29.9  Extreme: 30 and above         Discharge Instructions:  Allergies as of 08/09/2021       Reactions   Shellfish Allergy Anaphylaxis   Other reaction(s): Hallucination   Azithromycin Other (See Comments)   Extreme burning sensation at IV site   Tamiflu [oseltamivir Phosphate] Other (See Comments)   Reaction:  Hallucinations    Albuterol  Palpitations   Heart racing.         Medication List     STOP taking these medications    enoxaparin 60 MG/0.6ML injection Commonly known as: LOVENOX       TAKE these medications    albuterol 108 (90 Base) MCG/ACT inhaler Commonly known as: VENTOLIN HFA Inhale 2 puffs into the lungs every 6 (six) hours as needed for wheezing or shortness of breath.   aspirin EC 81 MG tablet Take 1 tablet (81 mg total) by mouth daily for 24 days. End on 09/02/2021. What changed: additional instructions   Calcium Carbonate-Vitamin D 600-400 MG-UNIT tablet Take 1 tablet by mouth daily.   cholecalciferol 1000 units tablet Commonly known as: VITAMIN D Take 1,000 Units by mouth daily.   clopidogrel 75 MG tablet Commonly known as: PLAVIX Take 1 tablet (75 mg total) by mouth daily.   dextromethorphan-guaiFENesin 30-600 MG 12hr tablet Commonly known as: MUCINEX DM Take 1 tablet by mouth 2 (two) times daily as needed for up to 7 days for cough.   diltiazem 180 MG 24 hr capsule Commonly known as: CARDIZEM CD TAKE 1 CAPSULE EVERY DAY   dorzolamide 2 % ophthalmic solution Commonly known as: TRUSOPT Place 1 drop into both eyes 2 (two) times daily.   ezetimibe 10 MG tablet Commonly known as: ZETIA TAKE 1 TABLET BY MOUTH DAILY.   furosemide 40 MG tablet Commonly known as: Lasix Take 1 tablet (40 mg total) by mouth daily.   ipratropium-albuterol 0.5-2.5 (3) MG/3ML Soln Commonly known as: DUONEB Take 3 mLs by nebulization every 4 (four) hours as needed.   isosorbide mononitrate 30 MG 24 hr tablet Commonly known as: IMDUR Take 1 tablet (30 mg total) by mouth daily.   latanoprost 0.005 % ophthalmic solution Commonly known as: XALATAN Place 1 drop into both eyes at bedtime.   magnesium oxide 400 MG tablet Commonly known as: MAG-OX Take 400 mg by mouth daily.   nitroGLYCERIN 0.4 MG SL tablet Commonly known as: Nitrostat Place 1 tablet (0.4 mg total) under the tongue every 5 (five)  minutes as needed (for chest pain or shortness of breath).   potassium chloride 10 MEQ tablet Commonly known as: KLOR-CON TAKE 1 TABLET BY MOUTH DAILY   predniSONE 20 MG tablet Commonly known as: DELTASONE Take 1 tablet (20 mg total) by mouth daily with breakfast for 4 days. Start taking on: August 10, 2021   pregabalin 50 MG capsule Commonly known as: LYRICA TAKE 1 CAPSULE BY MOUTH 2 TIMES DAILY   PreserVision AREDS 2+Multi Vit Caps Take 2 capsules by mouth daily.   rosuvastatin 20 MG tablet Commonly known as: CRESTOR TAKE 1 TABLET BY MOUTH DAILY.   vitamin C 500 MG tablet Commonly known as: ASCORBIC ACID Take 500 mg by mouth daily.   vitamin E 45 MG (100 UNITS) capsule Take 200 Units by mouth daily.   warfarin 2.5 MG tablet Commonly known as: COUMADIN Take as directed. If you are unsure how to take this medication, talk to your nurse or doctor. Original instructions: Take 1 tablet (2.5 mg total)  by mouth daily. Take as directed by the anti-coag clinic         Follow-up Information     Fisher, Kirstie Peri, MD Follow up in 1 week(s).   Specialty: Family Medicine Contact information: [address] [place] [address] [phone]         Minna Merritts, MD .   Specialty: Cardiology Contact information: [place] [address] [phone]                 Allergies  Allergen Reactions   Shellfish Allergy Anaphylaxis    Other reaction(s): Hallucination   Azithromycin Other (See Comments)    Extreme burning sensation at IV site    Tamiflu [Oseltamivir Phosphate] Other (See Comments)    Reaction:  Hallucinations     Albuterol Palpitations    Heart racing.      The results of significant diagnostics from this hospitalization (including imaging, microbiology, ancillary and laboratory) are listed below for reference.   Consultations:   Procedures/Studies: DG Chest 2 View  Result Date:  08/06/2021 CLINICAL DATA:  Shortness of breath, cough, fever EXAM: CHEST - 2 VIEW COMPARISON:  Chest radiograph 05/14/2020 FINDINGS: The heart is enlarged. The upper mediastinal contours are within normal limits. The lungs are hyperinflated consistent with COPD. Reticular opacities in the lateral right base are unchanged, likely reflecting scarring. Blunting of the costophrenic angles also likely reflects scar. There is no other focal airspace disease. There is no pleural effusion. There is no pneumothorax. There is no acute osseous abnormality. IMPRESSION: Cardiomegaly and scarring in the lateral right base, not significantly changed. No radiographic evidence of acute cardiopulmonary process. Electronically Signed   By: Valetta Mole M.D.   On: 08/06/2021 08:32   CT Angio Chest PE W and/or Wo Contrast  Result Date: 08/06/2021 CLINICAL DATA:  Pulmonary embolism (PE) suspected, high prob; shortness of breath, no fever EXAM: CT ANGIOGRAPHY CHEST WITH CONTRAST TECHNIQUE: Multidetector CT imaging of the chest was performed using the standard protocol during bolus administration of intravenous contrast. Multiplanar CT image reconstructions and MIPs were obtained to evaluate the vascular anatomy. CONTRAST:  68mL OMNIPAQUE IOHEXOL 350 MG/ML SOLN COMPARISON:  None. FINDINGS: Cardiovascular: Satisfactory opacification of the pulmonary arteries to the segmental level. No evidence of pulmonary embolism. Cardiomegaly. No pericardial effusion. Coronary artery calcification. Aortic atherosclerosis. Mediastinum/Nodes: Mildly enlarged mediastinal nodes. For example, pretracheal node measuring 1 cm short axis (series 5, image 116). Prevascular node measuring 1 cm short axis (image 142). Lungs/Pleura: Imaging during expiration. Peribronchial thickening. There is mucous plugging in the lower lobes. Patchy ground-glass density. Subsegmental atelectasis/scarring at the lung bases. Subpleural cystic change at the lung bases. Small  right pleural effusion. No pneumothorax. Upper Abdomen: No acute abnormality. Musculoskeletal: Decreased osseous mineralization. No acute abnormality. Review of the MIP images confirms the above findings. IMPRESSION: No evidence of acute pulmonary embolism. Lower lobe mucous plugging. Peribronchial thickening, which is probably chronic. Patchy ground-glass density may reflect atelectasis, mild edema, or a combination. Cardiomegaly.  Coronary artery and aortic atherosclerosis. Electronically Signed   By: Macy Mis M.D.   On: 08/06/2021 09:44   CARDIAC CATHETERIZATION  Result Date: 08/07/2021   1st Diag-1 lesion is 20% stenosed.   Non-stenotic Ost LAD to Mid LAD lesion was previously treated.   Non-stenotic 1st Diag-2 lesion was previously treated. 1.  Widely patent LAD/diagonal stents with no significant restenosis.  There is mild ostial stenosis in the first diagonal with somewhat hazy  appearance but this does not appear to be flow-limiting. 2.  Mildly elevated left ventricular end-diastolic pressure at 19 mmHg.  Left ventricular angiography was not performed. Recommendations: Continue dual antiplatelet therapy. Resume warfarin tonight. Resume heparin 6 hours after sheath pull and the patient can be transitioned back to low molecular weight heparin when ready for discharge.   CARDIAC CATHETERIZATION  Result Date: 08/02/2021 Conclusions: Severe single-vessel coronary artery disease with 80-90% ostial through mid LAD and proximal/mid D1 stenoses, as seen on recent diagnostic catheterization. Successful OCT-guided PCI to ostial through mid LAD using Onyx Frontier 2.75 x 26 mm drug-eluting stent with 0% residual stenosis and TIMI-3 flow. Successful PCI to D1 using Onyx Frontier 2.5 x 22 mm drug-eluting stent with 0% residual stenosis and TIMI-3 flow. Recommendations: Restart warfarin tonight per pharmacy.  Continue aspirin, clopidogrel, and warfarin x1 month, after which aspirin can be discontinued.   Recommend continuation of warfarin and clopidogrel for at least 6 months. Aggressive secondary prevention. Ongoing medical therapy of atrial fibrillation and severe mitral stenosis per Dr. Rockey Situ. Overnight observation with discharge home tomorrow if no post catheterization complications occur. Nelva Bush, MD [facility]     Labs: BNP (last 3 results) Recent Labs    08/06/21 0802  BNP 262.0*   Basic Metabolic Panel: Recent Labs  Lab 08/03/21 0144 08/06/21 0802 08/07/21 0449 08/08/21 0436 08/09/21 0328  NA 135 132* 130* 128* 130*  K 3.1* 3.5 3.8 4.1 3.9  CL 100 97* 98 95* 97*  CO2 25 25 26 25 26   GLUCOSE 110* 126* 192* 179* 161*  BUN 9 10 15 22 21   CREATININE 0.76 0.72 0.63 0.65 0.69  CALCIUM 8.4* 8.4* 8.5* 8.3* 8.1*  MG  --   --  2.4 2.4 2.3   Liver Function Tests: Recent Labs  Lab 08/06/21 0802  AST 54*  ALT 55*  ALKPHOS 87  BILITOT 0.9  PROT 6.4*  ALBUMIN 3.3*   No results for input(s): LIPASE, AMYLASE in the last 168 hours. No results for input(s): AMMONIA in the last 168 hours. CBC: Recent Labs  Lab 08/03/21 0144 08/06/21 0802 08/07/21 0449 08/08/21 0436 08/09/21 0328  WBC 10.2 9.3 10.3 17.5* 13.2*  NEUTROABS  --  6.9  --   --   --   HGB 11.3* 10.7* 9.7* 10.0* 9.2*  HCT 35.1* 33.7* 28.9* 30.9* 28.3*  MCV 76.3* 76.2* 74.7* 75.0* 74.9*  PLT 232 206 222 253 252   Cardiac Enzymes: No results for input(s): CKTOTAL, CKMB, CKMBINDEX, TROPONINI in the last 168 hours. BNP: Invalid input(s): POCBNP CBG: No results for input(s): GLUCAP in the last 168 hours. D-Dimer No results for input(s): DDIMER in the last 72 hours. Hgb A1c Recent Labs    08/06/21 1219  HGBA1C 6.2*   Lipid Profile Recent Labs    08/07/21 0449  CHOL 100  HDL 44  LDLCALC 47  TRIG 46  CHOLHDL 2.3   Thyroid function studies No results for input(s): TSH, T4TOTAL, T3FREE, THYROIDAB in the last 72 hours.  Invalid input(s): FREET3 Anemia work up No results for  input(s): VITAMINB12, FOLATE, FERRITIN, TIBC, IRON, RETICCTPCT in the last 72 hours. Urinalysis    Component Value Date/Time   COLORURINE AMBER (A) 09/17/2015 1841   APPEARANCEUR HAZY (A) 09/17/2015 1841   LABSPEC 1.029 09/17/2015 1841   PHURINE 5.0 09/17/2015 1841   GLUCOSEU NEGATIVE 09/17/2015 1841   HGBUR 2+ (A) 09/17/2015 1841   BILIRUBINUR NEGATIVE 09/17/2015 [place] NEGATIVE 09/17/2015 1841  PROTEINUR 100 (A) 09/17/2015 1841   NITRITE NEGATIVE 09/17/2015 1841   LEUKOCYTESUR 1+ (A) 09/17/2015 1841   Sepsis Labs Invalid input(s): PROCALCITONIN,  WBC,  LACTICIDVEN Microbiology Recent Results (from the past 240 hour(s))  Resp Panel by RT-PCR (Flu A&B, Covid) Nasopharyngeal Swab     Status: None   Collection Time: 08/06/21  9:09 AM   Specimen: Nasopharyngeal Swab; Nasopharyngeal(NP) swabs in vial transport medium  Result Value Ref Range Status   SARS Coronavirus 2 by RT PCR NEGATIVE NEGATIVE Final    Comment: (NOTE) SARS-CoV-2 target nucleic acids are NOT DETECTED.  The SARS-CoV-2 RNA is generally detectable in upper respiratory specimens during the acute phase of infection. The lowest concentration of SARS-CoV-2 viral copies this assay can detect is 138 copies/mL. A negative result does not preclude SARS-Cov-2 infection and should not be used as the sole basis for treatment or other patient management decisions. A negative result may occur with  improper specimen collection/handling, submission of specimen other than nasopharyngeal swab, presence of viral mutation(s) within the areas targeted by this assay, and inadequate number of viral copies(<138 copies/mL). A negative result must be combined with clinical observations, patient history, and epidemiological information. The expected result is Negative.  Fact Sheet for Patients:  EntrepreneurPulse.com.au  Fact Sheet for Healthcare Providers:  IncredibleEmployment.be  This  test is no t yet approved or cleared by the [place] FDA and  has been authorized for detection and/or diagnosis of SARS-CoV-2 by FDA under an Emergency Use Authorization (EUA). This EUA will remain  in effect (meaning this test can be used) for the duration of the COVID-19 declaration under Section 564(b)(1) of the Act, 21 U.S.C.section 360bbb-3(b)(1), unless the authorization is terminated  or revoked sooner.       Influenza A by PCR NEGATIVE NEGATIVE Final   Influenza B by PCR NEGATIVE NEGATIVE Final    Comment: (NOTE) The Xpert Xpress SARS-CoV-2/FLU/RSV plus assay is intended as an aid in the diagnosis of influenza from Nasopharyngeal swab specimens and should not be used as a sole basis for treatment. Nasal washings and aspirates are unacceptable for Xpert Xpress SARS-CoV-2/FLU/RSV testing.  Fact Sheet for Patients: EntrepreneurPulse.com.au  Fact Sheet for Healthcare Providers: IncredibleEmployment.be  This test is not yet approved or cleared by the [place] FDA and has been authorized for detection and/or diagnosis of SARS-CoV-2 by FDA under an Emergency Use Authorization (EUA). This EUA will remain in effect (meaning this test can be used) for the duration of the COVID-19 declaration under Section 564(b)(1) of the Act, 21 U.S.C. section 360bbb-3(b)(1), unless the authorization is terminated or revoked.  Performed at [facility], [place], [place] [address]   Respiratory (~20 pathogens) panel by PCR     Status: Abnormal   Collection Time: 08/08/21  6:00 PM   Specimen: Nasopharyngeal Swab; Respiratory  Result Value Ref Range Status   Adenovirus NOT DETECTED NOT DETECTED Final   Coronavirus 229E NOT DETECTED NOT DETECTED Final    Comment: (NOTE) The Coronavirus on the Respiratory Panel, DOES NOT test for the novel  Coronavirus (2019 nCoV)    Coronavirus HKU1 NOT DETECTED NOT DETECTED Final    Coronavirus NL63 NOT DETECTED NOT DETECTED Final   Coronavirus OC43 NOT DETECTED NOT DETECTED Final   Metapneumovirus NOT DETECTED NOT DETECTED Final   Rhinovirus / Enterovirus DETECTED (A) NOT DETECTED Final   Influenza A NOT DETECTED NOT DETECTED Final   Influenza B NOT DETECTED NOT DETECTED Final  Parainfluenza Virus 1 NOT DETECTED NOT DETECTED Final   Parainfluenza Virus 2 NOT DETECTED NOT DETECTED Final   Parainfluenza Virus 3 NOT DETECTED NOT DETECTED Final   Parainfluenza Virus 4 NOT DETECTED NOT DETECTED Final   Respiratory Syncytial Virus NOT DETECTED NOT DETECTED Final   Bordetella pertussis NOT DETECTED NOT DETECTED Final   Bordetella Parapertussis NOT DETECTED NOT DETECTED Final   Chlamydophila pneumoniae NOT DETECTED NOT DETECTED Final   Mycoplasma pneumoniae NOT DETECTED NOT DETECTED Final    Comment: Performed at [facility], [place] [address] [place], [place] [address]     Total time spend on discharging this patient, including the last patient exam, discussing the hospital stay, instructions for ongoing care as it relates to all pertinent caregivers, as well as preparing the medical discharge records, prescriptions, and/or referrals as applicable, is 35 minutes.    Enzo Bi, MD  [facility] 08/09/2021, 10:30 AM

## 2021-08-09 NOTE — TOC Transition Note (Signed)
Transition of Care [facility]) - CM/SW Discharge Note   Patient Details  Name: YIZEL CANBY MRN: [id number] Date of Birth: 07-07-1932  [facility]) CM/SW Contact:  Candie Chroman, LCSW Phone Number: 08/09/2021, 10:33 AM   Clinical Narrative:  Patient has orders to discharge home today. No further concerns. CSW signing off.   Final next level of care: Home/Self Care Barriers to Discharge: Barriers Resolved   Patient Goals and CMS Choice Patient states their goals for this hospitalization and ongoing recovery are:: to continue to get better CMS Medicare.gov Compare Post Acute Care list provided to:: Patient    Discharge Placement                    Patient and family notified of of transfer: 08/09/21  Discharge Plan and Services In-house Referral: Clinical Social Work   Post Acute Care Choice: NA                               Social Determinants of Health (SDOH) Interventions     Readmission Risk Interventions Readmission Risk Prevention Plan 08/07/2021  Transportation Screening Complete  PCP or Specialist Appt within 3-5 Days Complete  HRI or [place] Complete  Social Work Consult for [place] Planning/Counseling Complete  Palliative Care Screening Not Applicable  Medication Review Press photographer) Complete  Some recent data might be hidden

## 2021-08-09 NOTE — Plan of Care (Signed)
°  Problem: Education: Goal: Knowledge of General Education information will improve Description: Including pain rating scale, medication(s)/side effects and non-pharmacologic comfort measures Outcome: Adequate for Discharge   Problem: Health Behavior/Discharge Planning: Goal: Ability to manage health-related needs will improve Outcome: Adequate for Discharge   Problem: Clinical Measurements: Goal: Ability to maintain clinical measurements within normal limits will improve Outcome: Adequate for Discharge Goal: Will remain free from infection Outcome: Adequate for Discharge Goal: Diagnostic test results will improve Outcome: Adequate for Discharge Goal: Respiratory complications will improve Outcome: Adequate for Discharge Goal: Cardiovascular complication will be avoided Outcome: Adequate for Discharge   Problem: Activity: Goal: Risk for activity intolerance will decrease Outcome: Adequate for Discharge   Problem: Nutrition: Goal: Adequate nutrition will be maintained Outcome: Adequate for Discharge   Problem: Coping: Goal: Level of anxiety will decrease Outcome: Adequate for Discharge   Problem: Elimination: Goal: Will not experience complications related to bowel motility Outcome: Adequate for Discharge Goal: Will not experience complications related to urinary retention Outcome: Adequate for Discharge   Problem: Pain Managment: Goal: General experience of comfort will improve Outcome: Adequate for Discharge   Problem: Safety: Goal: Ability to remain free from injury will improve Outcome: Adequate for Discharge   Problem: Skin Integrity: Goal: Risk for impaired skin integrity will decrease Outcome: Adequate for Discharge   Problem: Education: Goal: Understanding of CV disease, CV risk reduction, and recovery process will improve Outcome: Adequate for Discharge Goal: Individualized Educational Video(s) Outcome: Adequate for Discharge   Problem:  Activity: Goal: Ability to return to baseline activity level will improve Outcome: Adequate for Discharge   Problem: Cardiovascular: Goal: Ability to achieve and maintain adequate cardiovascular perfusion will improve Outcome: Adequate for Discharge Goal: Vascular access site(s) Level 0-1 will be maintained Outcome: Adequate for Discharge   Problem: Health Behavior/Discharge Planning: Goal: Ability to safely manage health-related needs after discharge will improve Outcome: Adequate for Discharge Pt dc home per MD order.

## 2021-08-09 NOTE — Consult Note (Addendum)
[place] for Heparin + Warfarin Indication: chest pain/ACS  Allergies  Allergen Reactions   Shellfish Allergy Anaphylaxis    Other reaction(s): Hallucination   Azithromycin Other (See Comments)    Extreme burning sensation at IV site    Tamiflu [Oseltamivir Phosphate] Other (See Comments)    Reaction:  Hallucinations     Albuterol Palpitations    Heart racing.     Patient Measurements: Height: 5\' 5"  (165.1 cm) Weight: 57.9 kg (127 lb 11.2 oz) IBW/kg (Calculated) : 57 Heparin Dosing Weight: 58.5kg  Vital Signs: Temp: 98.7 F (37.1 C) (12/23 0416) Temp Source: Oral (12/22 2330) BP: 134/68 (12/23 0416) Pulse Rate: 64 (12/23 0416)  Labs: Recent Labs     0000 08/06/21 0802 08/06/21 0909 08/06/21 1030 08/06/21 1219 08/06/21 2144 08/07/21 0449 08/08/21 0005 08/08/21 0436 08/08/21 0919 08/08/21 1651 08/09/21 0328  HGB  --  10.7*  --   --   --   --  9.7*  --  10.0*  --   --  9.2*  HCT  --  33.7*  --   --   --   --  28.9*  --  30.9*  --   --  28.3*  PLT  --  206  --   --   --   --  222  --  253  --   --  252  APTT  --   --  40*  --   --   --   --   --   --   --   --   --   LABPROT   < >  --  14.7  --   --   --  14.2  --  14.1  --   --  17.1*  INR   < >  --  1.1  --   --   --  1.1  --  1.1  --   --  1.4*  HEPARINUNFRC  --   --   --   --  0.35   < >  --    < >  --  0.30 0.38 0.39  CREATININE  --  0.72  --   --   --   --  0.63  --  0.65  --   --  0.69  TROPONINIHS  --  1,505*  --  1,284* 1,236*  --   --   --   --   --   --   --    < > = values in this interval not displayed.     Estimated Creatinine Clearance: 44.6 mL/min (by C-G formula based on SCr of 0.69 mg/dL).   Medical History: Past Medical History:  Diagnosis Date   Aortic atherosclerosis ([place])    a. 05/2021 TEE: GrIII atheroma plaque involving the asc, transverse, and desc Ao.   Asthma    CAD (coronary artery disease)    a. 04/2021 Cath: LM nl, LAD 85p/m, D1 80,  RI nl, LCX nl, RCA nl; b. 07/2021 PCI: pLAD (2.75x26 Onyx Frontier DES), D1 (2.5x22 Onyx Frontier DES).   Carotid artery disease ([place])    a. s/p R carotid stenting (23mm x 66mm x 4cm long Exact stent); b. 06/2021 U/S: RICA 40-08%, LICA 67-61%.   Community acquired pneumonia    Essential hypertension    Glaucoma    Hyperlipidemia    Mitral regurgitation    a. TTE 08/2015: EF 60-65%, normal wall motion, mild  MR, mildly dilated left atrium measuring 40 mm, RVSF normal, PASP normal; b. 05/2021 TEE: Moderate MR.   Mitral stenosis    a. 05/2021 L/RHC: Sev MS w/ mean grad 13-8mmHg and MV area of 0.5-06.cm^2; b. 05/2021 TEE: EF 55-60%, no rwma, nl RV fxn, mod MR, mod MS (MV area by P1/2t: 1.61 cm^2 w/ mean grad of 25mmHg).   Peripheral neuropathy    Persistent atrial fibrillation ([place]) 09/17/2015   a. s/p DCCV 11/15/2015; b. CHADS2VASc => 4 (HTN, age x 2, female)--> warfarin; c. 06/2021 recurrent afib-->amio added.    Medications:  Medications Prior to Admission  Medication Sig Dispense Refill Last Dose   albuterol (VENTOLIN HFA) 108 (90 Base) MCG/ACT inhaler Inhale 2 puffs into the lungs every 6 (six) hours as needed for wheezing or shortness of breath. 8 g 2 08/06/2021   aspirin EC 81 MG tablet Take 81 mg by mouth daily. Swallow whole.   08/05/2021   Calcium Carbonate-Vitamin D 600-400 MG-UNIT tablet Take 1 tablet by mouth daily.   08/05/2021   cholecalciferol (VITAMIN D) 1000 UNITS tablet Take 1,000 Units by mouth daily.   08/05/2021   clopidogrel (PLAVIX) 75 MG tablet Take 1 tablet (75 mg total) by mouth daily. 90 tablet 3 08/05/2021   diltiazem (CARDIZEM CD) 180 MG 24 hr capsule TAKE 1 CAPSULE EVERY DAY 30 capsule 11 08/05/2021   dorzolamide (TRUSOPT) 2 % ophthalmic solution Place 1 drop into both eyes 2 (two) times daily.    08/05/2021   enoxaparin (LOVENOX) 60 MG/0.6ML injection Inject 0.6 mLs (60 mg total) into the skin every 12 (twelve) hours for 5 days. 6 mL 0 08/05/2021 at 2200   ezetimibe  (ZETIA) 10 MG tablet TAKE 1 TABLET BY MOUTH DAILY. 90 tablet 3 08/05/2021   furosemide (LASIX) 40 MG tablet Take 1 tablet (40 mg total) by mouth daily. 30 tablet 5 08/05/2021   ipratropium-albuterol (DUONEB) 0.5-2.5 (3) MG/3ML SOLN Take 3 mLs by nebulization every 4 (four) hours as needed. 360 mL 1 08/06/2021   isosorbide mononitrate (IMDUR) 30 MG 24 hr tablet Take 1 tablet (30 mg total) by mouth daily. 90 tablet 3 08/05/2021   latanoprost (XALATAN) 0.005 % ophthalmic solution Place 1 drop into both eyes at bedtime.    08/05/2021   magnesium oxide (MAG-OX) 400 MG tablet Take 400 mg by mouth daily.   08/05/2021   Multiple Vitamins-Minerals (PRESERVISION AREDS 2+MULTI VIT) CAPS Take 2 capsules by mouth daily.   08/05/2021   potassium chloride (KLOR-CON) 10 MEQ tablet TAKE 1 TABLET BY MOUTH DAILY 90 tablet 0 08/05/2021   pregabalin (LYRICA) 50 MG capsule TAKE 1 CAPSULE BY MOUTH 2 TIMES DAILY 60 capsule 5 08/05/2021   rosuvastatin (CRESTOR) 20 MG tablet TAKE 1 TABLET BY MOUTH DAILY. 90 tablet 3 08/05/2021   vitamin C (ASCORBIC ACID) 500 MG tablet Take 500 mg by mouth daily.   08/05/2021   vitamin E 100 UNIT capsule Take 200 Units by mouth daily.   08/05/2021   warfarin (COUMADIN) 2.5 MG tablet Take 1 tablet (2.5 mg total) by mouth daily. Take as directed by the anti-coag clinic 30 tablet 1 08/05/2021 at am   nitroGLYCERIN (NITROSTAT) 0.4 MG SL tablet Place 1 tablet (0.4 mg total) under the tongue every 5 (five) minutes as needed (for chest pain or shortness of breath). 100 tablet 3 prn at prn    Assessment: Patient admitted to ER with SOB. PMH includes CAD, HTN, mitral stenosis, and A.Fib (on warfarin PTA). Noted  elevated troponin and subtherapeutic INR. S/p cath 12/21. Resuming heparin 6 hours post cath at previous rate and restarting home warfarin for afib. Home dose: warfarin 2.5 mg daily. Pt had a recent stent on 12/16 and plan to have triple therapy for 1 month and move to dual therapy afterwards.    12/22 0919 HL 0.3  12/22 1651 HL 0.38 @ 1000 units/hr 12/23 0328 HL 0.39   Date INR Warfarin Dose  12/21 1.1 5 mg  12/22 1.1 2.5 mg   12/23 1.4 2.5 mg     Goal of Therapy:  Heparin level 0.3-0.7 units/ml Monitor platelets by anticoagulation protocol: Yes   Plan:  Heparin level is therapeutic. Continue heparin infusion at 1000 units/hr. Recheck heparin level and CBC with AM labs.   INR is subtherapeutic but trending up. Will give warfarin 2.5 mg x 1 tonight (home dose). Daily INR ordered.   Discharge recommendation: Continue patient's home dose of warfarin 2.5 mg daily. No bridge with enoxaparin needed after speaking with cardiology. Pt is on DAPT.    Oswald Hillock, PharmD, BCPS Clinical Pharmacist   08/09/2021 7:24 AM

## 2021-08-09 NOTE — Progress Notes (Signed)
Progress Note  Patient Name: Molly Hodges Date of Encounter: 08/09/2021  [facility] Cardiologist: Ida Rogue, MD   Subjective   Reports feeling somewhat better today Still with significant cough, wheezing is improving Received nebulizers every 4 hours, feels like she is moving more air Not on nasal cannula oxygen  Inpatient Medications    Scheduled Meds:  vitamin C  500 mg Oral Daily   aspirin EC  81 mg Oral Daily   calcium-vitamin D  1 tablet Oral Daily   cholecalciferol  1,000 Units Oral Daily   clopidogrel  75 mg Oral Daily   diltiazem  180 mg Oral Daily   dorzolamide  1 drop Both Eyes BID   ezetimibe  10 mg Oral Daily   furosemide  40 mg Oral Daily   ipratropium-albuterol  3 mL Nebulization Q6H   isosorbide mononitrate  30 mg Oral Daily   latanoprost  1 drop Both Eyes QHS   magnesium oxide  400 mg Oral Daily   multivitamin-lutein  2 capsule Oral Daily   predniSONE  40 mg Oral Q breakfast   pregabalin  50 mg Oral BID   rosuvastatin  20 mg Oral Daily   sodium chloride flush  3 mL Intravenous Q12H   sodium chloride flush  3 mL Intravenous Q12H   vitamin E  200 Units Oral Daily   warfarin  2.5 mg Oral ONCE-1600   Warfarin - Pharmacist Dosing Inpatient   Does not apply q1600   Continuous Infusions:  sodium chloride     heparin 1,000 Units/hr (08/08/21 1842)   PRN Meds: sodium chloride, acetaminophen, albuterol, dextromethorphan-guaiFENesin, hydrALAZINE, ipratropium-albuterol, nitroGLYCERIN, ondansetron (ZOFRAN) IV, sodium chloride flush   Vital Signs    Vitals:   08/09/21 0416 08/09/21 0528 08/09/21 0725 08/09/21 0740  BP: 134/68  (!) 154/60 128/64  Pulse: 64  (!) 59 62  Resp: 20  18 18   Temp: 98.7 F (37.1 C)  97.7 F (36.5 C) 98 F (36.7 C)  TempSrc:      SpO2: 99%  98% 97%  Weight: 58.4 kg 57.9 kg    Height:        Intake/Output Summary (Last 24 hours) at 08/09/2021 0954 Last data filed at 08/09/2021 0724 Gross per 24 hour  Intake  664.74 ml  Output 1200 ml  Net -535.26 ml   Last 3 Weights 08/09/2021 08/09/2021 08/08/2021  Weight (lbs) 127 lb 11.2 oz 128 lb 11.2 oz 129 lb 3 oz  Weight (kg) 57.924 kg 58.378 kg 58.6 kg      Telemetry    Atrial fibrillation- Personally Reviewed  ECG     - Personally Reviewed  Physical Exam   GEN: No acute distress.   Neck: No JVD Cardiac: Irregularly irregular no murmurs, rubs, or gallops.  Respiratory: Scattered wheezing worse on inspiration GI: Soft, nontender, non-distended  MS: No edema; No deformity. Neuro:  Nonfocal  Psych: Normal affect   Labs    High Sensitivity Troponin:   Recent Labs  Lab 08/06/21 0802 08/06/21 1030 08/06/21 1219  TROPONINIHS 1,505* 1,284* 1,236*     Chemistry Recent Labs  Lab 08/06/21 0802 08/07/21 0449 08/08/21 0436 08/09/21 0328  NA 132* 130* 128* 130*  K 3.5 3.8 4.1 3.9  CL 97* 98 95* 97*  CO2 25 26 25 26   GLUCOSE 126* 192* 179* 161*  BUN 10 15 22 21   CREATININE 0.72 0.63 0.65 0.69  CALCIUM 8.4* 8.5* 8.3* 8.1*  MG  --  2.4 2.4  2.3  PROT 6.4*  --   --   --   ALBUMIN 3.3*  --   --   --   AST 54*  --   --   --   ALT 55*  --   --   --   ALKPHOS 87  --   --   --   BILITOT 0.9  --   --   --   GFRNONAA >60 >60 >60 >60  ANIONGAP 10 6 8 7     Lipids  Recent Labs  Lab 08/07/21 0449  CHOL 100  TRIG 46  HDL 44  LDLCALC 47  CHOLHDL 2.3    Hematology Recent Labs  Lab 08/07/21 0449 08/08/21 0436 08/09/21 0328  WBC 10.3 17.5* 13.2*  RBC 3.87 4.12 3.78*  HGB 9.7* 10.0* 9.2*  HCT 28.9* 30.9* 28.3*  MCV 74.7* 75.0* 74.9*  MCH 25.1* 24.3* 24.3*  MCHC 33.6 32.4 32.5  RDW 15.7* 15.9* 16.0*  PLT 222 253 252   Thyroid No results for input(s): TSH, FREET4 in the last 168 hours.  BNP Recent Labs  Lab 08/06/21 0802  BNP 555.3*    DDimer  Recent Labs  Lab 08/06/21 0802  DDIMER 12.17*     Radiology    No results found.  Cardiac Studies   LHC 08/07/21     1st Diag-1 lesion is 20% stenosed.    Non-stenotic Ost LAD to Mid LAD lesion was previously treated.   Non-stenotic 1st Diag-2 lesion was previously treated.   1.  Widely patent LAD/diagonal stents with no significant restenosis.  There is mild ostial stenosis in the first diagonal with somewhat hazy appearance but this does not appear to be flow-limiting. 2.  Mildly elevated left ventricular end-diastolic pressure at 19 mmHg.  Left ventricular angiography was not performed.   Recommendations: Continue dual antiplatelet therapy. Resume warfarin tonight. Resume heparin 6 hours after sheath pull and the patient can be transitioned back to low molecular weight heparin when ready for discharge.  Patient Profile     87 y.o. female with a h/o history of CAD s/p recent LAD/D1 stenting 08/02/2021, persistent Afib on amio/warfarin, moderate mitral stenosis, moderate mitral regurgitation, HTN, HL, COPD (O2 @ HS), prior PNA, carotid dzs s/p R carotid stenting, Ao atherosclerosis, chronic dizziness and glaucoma, who is being seen today for the evaluation of dyspnea and elevated HsTrop  Assessment & Plan     Non-STEMI Elevated troponin in the setting of acute on chronic respiratory failure/ Cardiac catheterization performed this admission with patent stents -Continue aspirin, Plavix, statin, Zetia, Imdur -Cardiac issues are stable   COPD exacerbation Appears to be getting over viral infection, some sick contacts at home Moderate improvement on nebulizers every 4 hours, steroids Recommend she continue steroids at home, nebulizers on scheduled basis -Discharge planning per hospitalist service   Coronary disease with stable angina Recent stent placement to the LAD August 02, 2021 in [place] On aspirin, Plavix, statin, Zetia, Imdur Plan to continue triple therapy for 1 month and discontinue aspirin middle of January 2023   Persistent atrial fibrillation On warfarin, rate controlled Amiodarone held Continue diltiazem extended  release 180 daily Blood pressure stable  Long discussion concerning coronary disease, management of COPD exacerbation, recovery Discussed timing of potential discharge, suggested she talk with hospitalist service  Total encounter time more than 35 minutes  Greater than 50% was spent in counseling and coordination of care with the patient   [facility] will sign off.   Medication  Recommendations: Continue medications as above Other recommendations (labs, testing, etc): No further testing Follow up as an outpatient: We will arrange outpatient follow-up   For questions or updates, please contact [place] Please consult [email or web address] for contact info under        Signed, Cadence Ninfa Meeker, PA-C  08/09/2021, 9:54 AM

## 2021-08-09 NOTE — Care Management Important Message (Signed)
Important Message  Patient Details  Name: Molly Hodges MRN: [id number] Date of Birth: 1931-09-10   Medicare Important Message Given:  Yes  Patient is in an isolation room so I called her room [phone]) to review the Important Message from Medicare with her and her daughter answered and asked that I review it with her as well, which I did.  They are both in agreement with the the discharge today.  I asked if they would like another copy and they declined. I thanked her for time.   Juliann Pulse A Mathius Birkeland 08/09/2021, 10:58 AM

## 2021-08-09 NOTE — Consult Note (Signed)
[place] for Heparin + Warfarin Indication: chest pain/ACS  Allergies  Allergen Reactions   Shellfish Allergy Anaphylaxis    Other reaction(s): Hallucination   Azithromycin Other (See Comments)    Extreme burning sensation at IV site    Tamiflu [Oseltamivir Phosphate] Other (See Comments)    Reaction:  Hallucinations     Albuterol Palpitations    Heart racing.     Patient Measurements: Height: 5\' 5"  (165.1 cm) Weight: 58.4 kg (128 lb 11.2 oz) IBW/kg (Calculated) : 57 Heparin Dosing Weight: 58.5kg  Vital Signs: Temp: 98.7 F (37.1 C) (12/23 0416) Temp Source: Oral (12/22 2330) BP: 134/68 (12/23 0416) Pulse Rate: 64 (12/23 0416)  Labs: Recent Labs     0000 08/06/21 0802 08/06/21 0909 08/06/21 1030 08/06/21 1219 08/06/21 2144 08/07/21 0449 08/08/21 0005 08/08/21 0436 08/08/21 0919 08/08/21 1651 08/09/21 0328  HGB  --  10.7*  --   --   --   --  9.7*  --  10.0*  --   --  9.2*  HCT  --  33.7*  --   --   --   --  28.9*  --  30.9*  --   --  28.3*  PLT  --  206  --   --   --   --  222  --  253  --   --  252  APTT  --   --  40*  --   --   --   --   --   --   --   --   --   LABPROT   < >  --  14.7  --   --   --  14.2  --  14.1  --   --  17.1*  INR   < >  --  1.1  --   --   --  1.1  --  1.1  --   --  1.4*  HEPARINUNFRC  --   --   --   --  0.35   < >  --    < >  --  0.30 0.38 0.39  CREATININE  --  0.72  --   --   --   --  0.63  --  0.65  --   --  0.69  TROPONINIHS  --  1,505*  --  1,284* 1,236*  --   --   --   --   --   --   --    < > = values in this interval not displayed.     Estimated Creatinine Clearance: 44.6 mL/min (by C-G formula based on SCr of 0.69 mg/dL).   Medical History: Past Medical History:  Diagnosis Date   Aortic atherosclerosis ([place])    a. 05/2021 TEE: GrIII atheroma plaque involving the asc, transverse, and desc Ao.   Asthma    CAD (coronary artery disease)    a. 04/2021 Cath: LM nl, LAD 85p/m, D1 80,  RI nl, LCX nl, RCA nl; b. 07/2021 PCI: pLAD (2.75x26 Onyx Frontier DES), D1 (2.5x22 Onyx Frontier DES).   Carotid artery disease ([place])    a. s/p R carotid stenting (36mm x 42mm x 4cm long Exact stent); b. 06/2021 U/S: RICA 70-62%, LICA 37-62%.   Community acquired pneumonia    Essential hypertension    Glaucoma    Hyperlipidemia    Mitral regurgitation    a. TTE 08/2015: EF 60-65%, normal wall motion, mild  MR, mildly dilated left atrium measuring 40 mm, RVSF normal, PASP normal; b. 05/2021 TEE: Moderate MR.   Mitral stenosis    a. 05/2021 L/RHC: Sev MS w/ mean grad 13-57mmHg and MV area of 0.5-06.cm^2; b. 05/2021 TEE: EF 55-60%, no rwma, nl RV fxn, mod MR, mod MS (MV area by P1/2t: 1.61 cm^2 w/ mean grad of 36mmHg).   Peripheral neuropathy    Persistent atrial fibrillation ([place]) 09/17/2015   a. s/p DCCV 11/15/2015; b. CHADS2VASc => 4 (HTN, age x 2, female)--> warfarin; c. 06/2021 recurrent afib-->amio added.    Medications:  Medications Prior to Admission  Medication Sig Dispense Refill Last Dose   albuterol (VENTOLIN HFA) 108 (90 Base) MCG/ACT inhaler Inhale 2 puffs into the lungs every 6 (six) hours as needed for wheezing or shortness of breath. 8 g 2 08/06/2021   aspirin EC 81 MG tablet Take 81 mg by mouth daily. Swallow whole.   08/05/2021   Calcium Carbonate-Vitamin D 600-400 MG-UNIT tablet Take 1 tablet by mouth daily.   08/05/2021   cholecalciferol (VITAMIN D) 1000 UNITS tablet Take 1,000 Units by mouth daily.   08/05/2021   clopidogrel (PLAVIX) 75 MG tablet Take 1 tablet (75 mg total) by mouth daily. 90 tablet 3 08/05/2021   diltiazem (CARDIZEM CD) 180 MG 24 hr capsule TAKE 1 CAPSULE EVERY DAY 30 capsule 11 08/05/2021   dorzolamide (TRUSOPT) 2 % ophthalmic solution Place 1 drop into both eyes 2 (two) times daily.    08/05/2021   enoxaparin (LOVENOX) 60 MG/0.6ML injection Inject 0.6 mLs (60 mg total) into the skin every 12 (twelve) hours for 5 days. 6 mL 0 08/05/2021 at 2200   ezetimibe  (ZETIA) 10 MG tablet TAKE 1 TABLET BY MOUTH DAILY. 90 tablet 3 08/05/2021   furosemide (LASIX) 40 MG tablet Take 1 tablet (40 mg total) by mouth daily. 30 tablet 5 08/05/2021   ipratropium-albuterol (DUONEB) 0.5-2.5 (3) MG/3ML SOLN Take 3 mLs by nebulization every 4 (four) hours as needed. 360 mL 1 08/06/2021   isosorbide mononitrate (IMDUR) 30 MG 24 hr tablet Take 1 tablet (30 mg total) by mouth daily. 90 tablet 3 08/05/2021   latanoprost (XALATAN) 0.005 % ophthalmic solution Place 1 drop into both eyes at bedtime.    08/05/2021   magnesium oxide (MAG-OX) 400 MG tablet Take 400 mg by mouth daily.   08/05/2021   Multiple Vitamins-Minerals (PRESERVISION AREDS 2+MULTI VIT) CAPS Take 2 capsules by mouth daily.   08/05/2021   potassium chloride (KLOR-CON) 10 MEQ tablet TAKE 1 TABLET BY MOUTH DAILY 90 tablet 0 08/05/2021   pregabalin (LYRICA) 50 MG capsule TAKE 1 CAPSULE BY MOUTH 2 TIMES DAILY 60 capsule 5 08/05/2021   rosuvastatin (CRESTOR) 20 MG tablet TAKE 1 TABLET BY MOUTH DAILY. 90 tablet 3 08/05/2021   vitamin C (ASCORBIC ACID) 500 MG tablet Take 500 mg by mouth daily.   08/05/2021   vitamin E 100 UNIT capsule Take 200 Units by mouth daily.   08/05/2021   warfarin (COUMADIN) 2.5 MG tablet Take 1 tablet (2.5 mg total) by mouth daily. Take as directed by the anti-coag clinic 30 tablet 1 08/05/2021 at am   nitroGLYCERIN (NITROSTAT) 0.4 MG SL tablet Place 1 tablet (0.4 mg total) under the tongue every 5 (five) minutes as needed (for chest pain or shortness of breath). 100 tablet 3 prn at prn    Assessment: Patient admitted to ER with SOB. PMH includes CAD, HTN, mitral stenosis, and A.Fib (on warfarin PTA). Noted  elevated troponin and subtherapeutic INR. S/p cath 12/21. Resuming heparin 6 hours post cath at previous rate and restarting home warfarin for afib. Home dose: warfarin 2.5 mg daily.   12/22 0919 HL 0.3  12/22 1651 HL 0.38 @ 1000 units/hr  Date INR Warfarin Dose  12/21 1.1 5 mg  12/22  1.1 2.5 mg     Goal of Therapy:  Heparin level 0.3-0.7 units/ml Monitor platelets by anticoagulation protocol: Yes   Plan:  12/23:  HL @ 0328 = 0.39, therapeutic X 3 Will continue pt on current rate and recheck HL on 12/24 with AM labs.   INR is subtherapeutic. Will give warfarin 2.5 mg x 1 tonight (home dose). Pt received 2x the home dose on 12/21 to help get INR therapeutic. We will most likely we the effects of it tomorrow. Daily INR ordered.   Orene Desanctis, PharmD Clinical Pharmacist   08/09/2021 5:14 AM

## 2021-08-14 ENCOUNTER — Telehealth: Payer: Self-pay

## 2021-08-14 NOTE — Telephone Encounter (Signed)
Copied from [place] [phone]. Topic: General - Other >> Aug 14, 2021  2:42 PM Alanda Slim E wrote: Reason for CRM: [employer] advised pt to contact [facility] office about meal benefit / pt is suppose to received meals after discharge while recovering / please advise on how pt is to receive the meals from this benefit/ they were advised that Dr. Caryn Section has to confirm pt spent a certain amount of nights in the hospital / needs a referral for this benefit / pt was in the hospital from 12.20.22 - 12.23.22

## 2021-08-15 ENCOUNTER — Telehealth: Payer: Self-pay

## 2021-08-15 NOTE — Telephone Encounter (Signed)
Pt was calling back and speaking with Agent but advised Agent that pt needs to follow up with office on this matter d/t NT has no resources for meal benefits. Agent was able to transfer pt to office.

## 2021-08-15 NOTE — Telephone Encounter (Signed)
No clue. Is this an insurance benefit? Maybe someone from her insurance needs to call us... I don't know. Maybe CCM can help.

## 2021-08-15 NOTE — Telephone Encounter (Signed)
Patient's daughter [name] returned call. I advised her of Dr. Maralyn Sago message below. [name] states that this is a benefit that patients insurance [employer] offers. [name] is going to contact her insurance and advise them to reach out to Dr. Caryn Section about what is needed from her PCP to access this benefit.

## 2021-08-15 NOTE — Telephone Encounter (Signed)
Tried calling patient. Left message to call back. OK for PEC triage to advise.  ?

## 2021-08-17 ENCOUNTER — Other Ambulatory Visit: Payer: Self-pay | Admitting: Family Medicine

## 2021-08-17 NOTE — Telephone Encounter (Signed)
Requested medication (s) are due for refill today: yes  Requested medication (s) are on the active medication list: yes  Last refill:  01/19/21 #60 5 RF  Future visit scheduled: yes  Notes to clinic:  med not delegated to NT to RF   Requested Prescriptions  Pending Prescriptions Disp Refills   pregabalin (LYRICA) 50 MG capsule [Pharmacy Med Name: PREGABALIN 50 MG CAP] 60 capsule     Sig: TAKE 1 CAPSULE BY MOUTH 2 TIMES DAILY     Not Delegated - Neurology:  Anticonvulsants - Controlled Failed - 08/17/2021 10:16 AM      Failed - This refill cannot be delegated      Passed - Valid encounter within last 12 months    Recent Outpatient Visits           9 months ago [place], [place], PA-C   10 months ago Uncomplicated asthma, unspecified asthma severity, unspecified whether persistent   [facility] Carles Collet M, [place]   11 months ago Bilateral carotid artery stenosis   [facility] Birdie Sons, MD   1 year ago [place], Donald E, MD   1 year ago COPD with acute bronchitis [facility])   [facility] Trinna Post, [place]       Future Appointments             In 1 week [place], Kathlene November, MD [facility], [facility]   In 2 weeks Caryn Section, Kirstie Peri, MD [facility], [place]

## 2021-08-21 DIAGNOSIS — R0902 Hypoxemia: Secondary | ICD-10-CM | POA: Diagnosis not present

## 2021-08-23 ENCOUNTER — Emergency Department: Payer: Medicare Other

## 2021-08-23 ENCOUNTER — Other Ambulatory Visit: Payer: Self-pay

## 2021-08-23 ENCOUNTER — Emergency Department
Admission: EM | Admit: 2021-08-23 | Discharge: 2021-08-23 | Disposition: A | Discharge status: Home / Self Care | Payer: Medicare Other | Attending: Emergency Medicine | Admitting: Emergency Medicine

## 2021-08-23 ENCOUNTER — Encounter: Payer: Self-pay | Admitting: Emergency Medicine

## 2021-08-23 DIAGNOSIS — Z7901 Long term (current) use of anticoagulants: Secondary | ICD-10-CM | POA: Insufficient documentation

## 2021-08-23 DIAGNOSIS — E039 Hypothyroidism, unspecified: Secondary | ICD-10-CM | POA: Diagnosis not present

## 2021-08-23 DIAGNOSIS — I517 Cardiomegaly: Secondary | ICD-10-CM | POA: Diagnosis not present

## 2021-08-23 DIAGNOSIS — I110 Hypertensive heart disease with heart failure: Secondary | ICD-10-CM | POA: Insufficient documentation

## 2021-08-23 DIAGNOSIS — Z743 Need for continuous supervision: Secondary | ICD-10-CM | POA: Diagnosis not present

## 2021-08-23 DIAGNOSIS — R0789 Other chest pain: Secondary | ICD-10-CM | POA: Diagnosis not present

## 2021-08-23 DIAGNOSIS — R079 Chest pain, unspecified: Secondary | ICD-10-CM

## 2021-08-23 DIAGNOSIS — R6889 Other general symptoms and signs: Secondary | ICD-10-CM | POA: Diagnosis not present

## 2021-08-23 DIAGNOSIS — J9811 Atelectasis: Secondary | ICD-10-CM | POA: Diagnosis not present

## 2021-08-23 DIAGNOSIS — I2510 Atherosclerotic heart disease of native coronary artery without angina pectoris: Secondary | ICD-10-CM | POA: Diagnosis not present

## 2021-08-23 DIAGNOSIS — E876 Hypokalemia: Secondary | ICD-10-CM | POA: Diagnosis not present

## 2021-08-23 DIAGNOSIS — D68318 Other hemorrhagic disorder due to intrinsic circulating anticoagulants, antibodies, or inhibitors: Secondary | ICD-10-CM | POA: Diagnosis not present

## 2021-08-23 DIAGNOSIS — I4891 Unspecified atrial fibrillation: Secondary | ICD-10-CM | POA: Diagnosis not present

## 2021-08-23 DIAGNOSIS — I509 Heart failure, unspecified: Secondary | ICD-10-CM | POA: Insufficient documentation

## 2021-08-23 DIAGNOSIS — I482 Chronic atrial fibrillation, unspecified: Secondary | ICD-10-CM | POA: Insufficient documentation

## 2021-08-23 LAB — CBC WITH DIFFERENTIAL/PLATELET
Abs Immature Granulocytes: 0.11 10*3/uL — ABNORMAL HIGH (ref 0.00–0.07)
Basophils Absolute: 0.1 10*3/uL (ref 0.0–0.1)
Basophils Relative: 1 %
Eosinophils Absolute: 0.4 10*3/uL (ref 0.0–0.5)
Eosinophils Relative: 3 %
HCT: 32.4 % — ABNORMAL LOW (ref 36.0–46.0)
Hemoglobin: 10.3 g/dL — ABNORMAL LOW (ref 12.0–15.0)
Immature Granulocytes: 1 %
Lymphocytes Relative: 4 %
Lymphs Abs: 0.6 10*3/uL — ABNORMAL LOW (ref 0.7–4.0)
MCH: 24 pg — ABNORMAL LOW (ref 26.0–34.0)
MCHC: 31.8 g/dL (ref 30.0–36.0)
MCV: 75.5 fL — ABNORMAL LOW (ref 80.0–100.0)
Monocytes Absolute: 1 10*3/uL (ref 0.1–1.0)
Monocytes Relative: 7 %
Neutro Abs: 12.3 10*3/uL — ABNORMAL HIGH (ref 1.7–7.7)
Neutrophils Relative %: 84 %
Platelets: 277 10*3/uL (ref 150–400)
RBC: 4.29 MIL/uL (ref 3.87–5.11)
RDW: 15.6 % — ABNORMAL HIGH (ref 11.5–15.5)
WBC: 14.5 10*3/uL — ABNORMAL HIGH (ref 4.0–10.5)
nRBC: 0 % (ref 0.0–0.2)

## 2021-08-23 LAB — PROCALCITONIN: Procalcitonin: <0.1 ng/mL

## 2021-08-23 LAB — COMPREHENSIVE METABOLIC PANEL
ALT: 64 U/L — ABNORMAL HIGH (ref 0–44)
AST: 48 U/L — ABNORMAL HIGH (ref 15–41)
Albumin: 3.3 g/dL — ABNORMAL LOW (ref 3.5–5.0)
Alkaline Phosphatase: 84 U/L (ref 38–126)
Anion gap: 11 (ref 5–15)
BUN: 13 mg/dL (ref 8–23)
CO2: 26 mmol/L (ref 22–32)
Calcium: 8.4 mg/dL — ABNORMAL LOW (ref 8.9–10.3)
Chloride: 96 mmol/L — ABNORMAL LOW (ref 98–111)
Creatinine, Ser: 0.58 mg/dL (ref 0.44–1.00)
GFR, Estimated: >60 mL/min (ref >60)
Glucose, Bld: 129 mg/dL — ABNORMAL HIGH (ref 70–99)
Potassium: 3.3 mmol/L — ABNORMAL LOW (ref 3.5–5.1)
Sodium: 133 mmol/L — ABNORMAL LOW (ref 135–145)
Total Bilirubin: 0.9 mg/dL (ref 0.3–1.2)
Total Protein: 6.6 g/dL (ref 6.5–8.1)

## 2021-08-23 LAB — MAGNESIUM: Magnesium: 2.1 mg/dL (ref 1.7–2.4)

## 2021-08-23 LAB — TROPONIN I (HIGH SENSITIVITY)
Troponin I (High Sensitivity): 18 ng/L — ABNORMAL HIGH (ref <18)
Troponin I (High Sensitivity): 17 ng/L (ref <18)

## 2021-08-23 LAB — PROTIME-INR
INR: 2.2 — ABNORMAL HIGH (ref 0.8–1.2)
Prothrombin Time: 24.6 seconds — ABNORMAL HIGH (ref 11.4–15.2)

## 2021-08-23 MED ORDER — POTASSIUM CHLORIDE CRYS ER 20 MEQ PO TBCR
40.0000 meq | EXTENDED_RELEASE_TABLET | Freq: Once | ORAL | Status: AC
  Administered 2021-08-23: 40 meq via ORAL
  Filled 2021-08-23: qty 2

## 2021-08-23 NOTE — ED Notes (Signed)
Repeat trop collected and sent to lab.

## 2021-08-23 NOTE — ED Triage Notes (Addendum)
EMS brings pt in from home for c/o CP this morning; describes as "heaviness and heart running away"; 2 NTG & 324mg  ASA at home without relief; 2 stents placed last wk

## 2021-08-23 NOTE — ED Provider Notes (Signed)
[facility] Provider Note    Event Date/Time   First MD Initiated Contact with Patient 08/23/21 0840     (approximate)   History   Chest Pain   HPI  Molly Hodges is a 88 y.o. female no past medical history of CHF, HTN, HDL, hypothyroidism, paroxysmal A. fib on Coumadin, and CAD having recently been admitted 12/20-12/23 for NSTEMI and admission a week before this for placement of a stent with most recent cath showing no obstruction.  Patient was discharged on aspirin, Plavix and Coumadin.  Patient is presenting today for assessment of some left-sided chest pressure.  She states this is on and off and seems to last a few minutes and has been occurring since she was discharged.  States she will sometimes feel short of breath with this this is not significantly different today than when she was discharged but she is tired of it not getting better.  She has not been measuring her heart rates and sometimes feel some palpitations standings episodes but is not sure if her heart rate is faster than normal.  She states the cough she had from her bronchitis during her last discharge is much improved and she does know new cough or worsening, fevers, back pain, headache, earache, sore throat, abdominal pain, vomiting, diarrhea, burning with urination, rash or focal extremity weakness numbness or tingling.  Denies any other acute concerns at this time.  She states she is compliant with her medications.      Physical Exam  Triage Vital Signs: ED Triage Vitals  Enc Vitals Group     BP 08/23/21 0615 (!) 136/54     Pulse Rate 08/23/21 0615 64     Resp 08/23/21 0615 18     Temp 08/23/21 0615 97.9 F (36.6 C)     Temp Source 08/23/21 0615 Oral     SpO2 08/23/21 0615 97 %     Weight 08/23/21 0616 129 lb (58.5 kg)     Height 08/23/21 0616 5' (1.524 m)     Head Circumference --      Peak Flow --      Pain Score 08/23/21 0618 5     Pain Loc --      Pain Edu? --       Excl. in [place]? --     Most recent vital signs: Vitals:   08/23/21 0615 08/23/21 0847  BP: (!) 136/54 (!) 140/56  Pulse: 64 72  Resp: 18 18  Temp: 97.9 F (36.6 C)   SpO2: 97% 97%    General: Awake, no distress.  CV:  Good peripheral perfusion.  Regular.  Slight systolic murmur auscultated. Resp:  Normal effort.  Clear bilaterally. Abd:  No distention.  Nontender throughout. Other:  No significant lower extremity edema.   ED Results / Procedures / Treatments  Labs (all labs ordered are listed, but only abnormal results are displayed) Labs Reviewed  CBC WITH DIFFERENTIAL/PLATELET - Abnormal; Notable for the following components:      Result Value   WBC 14.5 (*)    Hemoglobin 10.3 (*)    HCT 32.4 (*)    MCV 75.5 (*)    MCH 24.0 (*)    RDW 15.6 (*)    Neutro Abs 12.3 (*)    Lymphs Abs 0.6 (*)    Abs Immature Granulocytes 0.11 (*)    All other components within normal limits  COMPREHENSIVE METABOLIC PANEL - Abnormal; Notable for the following components:  Sodium 133 (*)    Potassium 3.3 (*)    Chloride 96 (*)    Glucose, Bld 129 (*)    Calcium 8.4 (*)    Albumin 3.3 (*)    AST 48 (*)    ALT 64 (*)    All other components within normal limits  PROTIME-INR - Abnormal; Notable for the following components:   Prothrombin Time 24.6 (*)    INR 2.2 (*)    All other components within normal limits  TROPONIN I (HIGH SENSITIVITY) - Abnormal; Notable for the following components:   Troponin I (High Sensitivity) 18 (*)    All other components within normal limits  MAGNESIUM  PROCALCITONIN  TROPONIN I (HIGH SENSITIVITY)     EKG  EKG remarkable for A. fib with a ventricular rate of 67 and significant artifact in the inferior and lateral leads I and aVL versus nonspecific changes without other clear evidence of acute ischemia or significant arrhythmia.   RADIOLOGY  Chest x-ray shows cardiomegaly and some right basilar atelectasis without large effusion, overt edema,  pneumothorax, focal consolidation or other clear acute process.  This was interpreted myself.  I also reviewed radiology's interpretation and agree.    PROCEDURES:  Critical Care performed: No  .1-3 Lead EKG Interpretation Performed by: Lucrezia Starch, MD Authorized by: Lucrezia Starch, MD     Interpretation: non-specific     ECG rate assessment: normal     Rhythm: atrial fibrillation     Ectopy: none   The patient is on the cardiac monitor to evaluate for evidence of arrhythmia and/or significant heart rate changes.   MEDICATIONS ORDERED IN ED: Medications  potassium chloride SA (KLOR-CON M) CR tablet 40 mEq (40 mEq Oral Given 08/23/21 0906)     IMPRESSION / MDM / ASSESSMENT AND PLAN / ED COURSE  I reviewed the triage vital signs and the nursing notes.                              Differential diagnosis includes, but is not limited to ACS, paroxysmal tachydysrhythmias, anemia, embolic derangements, pneumonia, pneumothorax, CHF exacerbation, with lower suspicion for PE at this time given INR is 2.2 today and lower suspicion for dissection given overall description of symptoms is fairly paroxysmal patient stating she is feeling better now.  EKG remarkable for A. fib with a ventricular rate of 67 and significant artifact in the inferior and lateral leads I and aVL versus nonspecific changes without other clear evidence of acute ischemia or significant arrhythmia.  Initial troponin at the upper limit of normal at 18.  Repeat is 17.  I have a low suspicion for ACS especially given 2 weeks ago patient's troponin was 1284 and 1236.  Chest x-ray shows cardiomegaly and some right basilar atelectasis without large effusion, overt edema, pneumothorax, focal consolidation or other clear acute process.  This was interpreted myself.  I also reviewed radiology's interpretation and agree.  CBC shows WBC count of 14.5 compared to 13.22 weeks ago.  Hemoglobin is 10.3 compared to 9.22 weeks ago.   Low suspicion for acute symptomatic anemia.  Platelets are unremarkable.  CMP remarkable for potassium 3.3.  This was completed.  LFTs appear slight above normal limits but this appears to be subacute to chronic issue for the patient and I have low suspicion for an acute hepatitis or cholestatic process.  INR is therapeutic at 2.2.  Museum is 2.1.  I reviewed  patient's prior records including discharge summary from most recent hospitalization on 12/23.  Overall I suspect likely symptomatic tacky dysrhythmia which I do not see any evidence of emergency room.  It is possible patient's hypokalemia is putting in more risk for this.  I considered admission given patient's comorbidities and age, at this time I have low suspicion for other immediate life-threatening process and given absence of any tacky dysrhythmia noted in the emergency room with otherwise reassuring exam and work-up I think she is stable for discharge with continued outpatient cardiology evaluation and follow-up.      FINAL CLINICAL IMPRESSION(S) / ED DIAGNOSES   Final diagnoses:  Chest pain, unspecified type  Hypokalemia  Anticoagulated  Chronic atrial fibrillation ([place])     Rx / DC Orders   ED Discharge Orders     None        Note:  This document was prepared using Dragon voice recognition software and may include unintentional dictation errors.   Lucrezia Starch, MD 08/23/21 1045

## 2021-08-24 ENCOUNTER — Other Ambulatory Visit: Payer: Self-pay | Admitting: Cardiovascular Disease

## 2021-08-25 NOTE — Progress Notes (Signed)
Cardiology Office Note  Date:  08/26/2021   ID:  Molly Hodges, DOB Dec 26, 1931, MRN [id number]  PCP:  Birdie Sons, MD   Chief Complaint  Patient presents with   Follow up [facility]; shortness of breath     Patient c/o shortness of breath with very little exertion, A-Fib and chest heaviness. Medications reviewed by the patient verbally.     HPI:  Molly Hodges is a 88 y.o. female with  Paroxysmal Afib,  (CHADS2VASc of 4 - HTN, age x 2, female)  COPD, (never smoked) asthma, prior episodes of PNA,   hospital admission from 1/30 to 09/21/15,  HTN  Mitral valve stenosis, moderate December 2021 Pericardial effusion mild to moderate December 2021 PAD, carotid stenosis, high-grade on the right, stent placed Chronic dizziness 05/2020 started, last couple of weeks at a time, seen by ENT/neurology who presents  Follow-up for her atrial fibrillation,Previous cardioversion, carotid stenosis  Last seen by myself in clinic 12/22 Normal sinus rhythm October 2022 atrial fibrillation November 2022  Cath 08/02/21  Severe single-vessel coronary artery disease with 80-90% ostial through mid LAD and proximal/mid D1 stenoses, as seen on recent diagnostic catheterization. Successful OCT-guided PCI to ostial through mid LAD using Onyx Frontier 2.75 x 26 mm drug-eluting stent with 0% residual stenosis and TIMI-3 flow. Successful PCI to D1 using Onyx Frontier 2.5 x 22 mm drug-eluting stent with 0% residual stenosis and TIMI-3 flow.  Cath 08/07/2021 for chest pain/bronchitis   1st Diag-1 lesion is 20% stenosed.   Non-stenotic Ost LAD to Mid LAD lesion was previously treated.   Non-stenotic 1st Diag-2 lesion was previously treated.   1.  Widely patent LAD/diagonal stents with no significant restenosis.    In the ER 08/23/21 for chest pain, enz neg Treated for bronchitis  Recent chest x-ray showing mucous plugging  Reports that she is doing nebulizers Not much coming up Still with significant  shortness of breath on exertion, feels very tired, heavy in the chest  EKG personally reviewed by myself on todays visit Shows normal sinus rhythm with rate 70 bpm no significant ST or T-wave changes  Other past medical history reviewed Echocardiogram July 25, 2020  EF 55 to 60%, mildly dilated left atrium, mild to moderate circumferential pericardial effusion, Mild to moderate mitral valve regurgitation moderate mitral valve stenosis gradient 10 mmHg Dilated IVC  Seen by Dr. Lucky Cowboy, episodes of dizziness High-grade right carotid artery stenosis estimated 80 to 99%  found to be in new onset Afib with RVR on 09/17/15 upon her arrival to [facility], pneumonia started on Eliquis given her stroke risk (CHADS2VASc of 4 - HTN, age x 2, female) and rate controlled.  Beta blockers were avoided given her asthma.  Started on thousand channel blocker     PMH:   has a past medical history of Aortic atherosclerosis ([place]), Asthma, CAD (coronary artery disease), Carotid artery disease ([place]), Community acquired pneumonia, Essential hypertension, Glaucoma, Hyperlipidemia, Mitral regurgitation, Mitral stenosis, Peripheral neuropathy, and Persistent atrial fibrillation ([place]) (09/17/2015).  PSH:    Past Surgical History:  Procedure Laterality Date   ABDOMINAL HYSTERECTOMY  1987   due to heavy bleeding   APPENDECTOMY     CARDIAC CATHETERIZATION     CAROTID PTA/STENT INTERVENTION Right 08/15/2020   Procedure: CAROTID PTA/STENT INTERVENTION;  Surgeon: Algernon Huxley, MD;  Location: [place] [facility];  Service: Cardiovascular;  Laterality: Right;   CATARACT EXTRACTION     CORONARY STENT INTERVENTION N/A 08/02/2021   Procedure: CORONARY STENT  INTERVENTION;  Surgeon: Nelva Bush, MD;  Location: [place] [facility];  Service: Cardiovascular;  Laterality: N/A;   ELECTROPHYSIOLOGIC STUDY N/A 11/15/2015   Procedure: CARDIOVERSION;  Surgeon: Minna Merritts, MD;  Location: [facility];  Service: Cardiovascular;   Laterality: N/A;   INTRAVASCULAR IMAGING/OCT N/A 08/02/2021   Procedure: INTRAVASCULAR IMAGING/OCT;  Surgeon: Nelva Bush, MD;  Location: [place] [facility];  Service: Cardiovascular;  Laterality: N/A;   LEFT HEART CATH AND CORONARY ANGIOGRAPHY N/A 08/02/2021   Procedure: LEFT HEART CATH AND CORONARY ANGIOGRAPHY;  Surgeon: Nelva Bush, MD;  Location: [place] [facility];  Service: Cardiovascular;  Laterality: N/A;   LEFT HEART CATH AND CORONARY ANGIOGRAPHY N/A 08/07/2021   Procedure: LEFT HEART CATH AND CORONARY ANGIOGRAPHY;  Surgeon: Wellington Hampshire, MD;  Location: [place] [facility];  Service: Cardiovascular;  Laterality: N/A;   RIGHT/LEFT HEART CATH AND CORONARY ANGIOGRAPHY N/A 05/17/2021   Procedure: RIGHT/LEFT HEART CATH AND CORONARY ANGIOGRAPHY;  Surgeon: Nelva Bush, MD;  Location: [place] [facility];  Service: Cardiovascular;  Laterality: N/A;   TEE WITHOUT CARDIOVERSION N/A 06/13/2021   Procedure: TRANSESOPHAGEAL ECHOCARDIOGRAM (TEE);  Surgeon: Minna Merritts, MD;  Location: [facility];  Service: Cardiovascular;  Laterality: N/A;    Current Outpatient Medications  Medication Sig Dispense Refill   albuterol (VENTOLIN HFA) 108 (90 Base) MCG/ACT inhaler Inhale 2 puffs into the lungs every 6 (six) hours as needed for wheezing or shortness of breath. 8 g 2   aspirin EC 81 MG tablet Take 1 tablet (81 mg total) by mouth daily for 24 days. End on 09/02/2021.     Calcium Carbonate-Vitamin D 600-400 MG-UNIT tablet Take 1 tablet by mouth daily.     cholecalciferol (VITAMIN D) 1000 UNITS tablet Take 1,000 Units by mouth daily.     clopidogrel (PLAVIX) 75 MG tablet Take 1 tablet (75 mg total) by mouth daily. 90 tablet 3   diltiazem (CARDIZEM CD) 180 MG 24 hr capsule TAKE 1 CAPSULE EVERY DAY 30 capsule 11   dorzolamide (TRUSOPT) 2 % ophthalmic solution Place 1 drop into both eyes 2 (two) times daily.      ezetimibe (ZETIA) 10 MG tablet TAKE 1 TABLET BY MOUTH DAILY. 90 tablet 3    furosemide (LASIX) 40 MG tablet Take 1 tablet (40 mg total) by mouth daily. 30 tablet 5   ipratropium-albuterol (DUONEB) 0.5-2.5 (3) MG/3ML SOLN Take 3 mLs by nebulization every 4 (four) hours as needed. 360 mL 1   isosorbide mononitrate (IMDUR) 30 MG 24 hr tablet Take 1 tablet (30 mg total) by mouth daily. 90 tablet 3   latanoprost (XALATAN) 0.005 % ophthalmic solution Place 1 drop into both eyes at bedtime.      magnesium oxide (MAG-OX) 400 MG tablet Take 400 mg by mouth daily.     Multiple Vitamins-Minerals (PRESERVISION AREDS 2+MULTI VIT) CAPS Take 2 capsules by mouth daily.     nitroGLYCERIN (NITROSTAT) 0.4 MG SL tablet Place 1 tablet (0.4 mg total) under the tongue every 5 (five) minutes as needed (for chest pain or shortness of breath). 100 tablet 3   potassium chloride (KLOR-CON) 10 MEQ tablet TAKE 1 TABLET BY MOUTH DAILY 90 tablet 0   pregabalin (LYRICA) 50 MG capsule TAKE 1 CAPSULE BY MOUTH 2 TIMES DAILY 60 capsule 3   rosuvastatin (CRESTOR) 20 MG tablet TAKE 1 TABLET BY MOUTH DAILY. 90 tablet 3   vitamin C (ASCORBIC ACID) 500 MG tablet Take 500 mg by mouth daily.  vitamin E 100 UNIT capsule Take 200 Units by mouth daily.     warfarin (COUMADIN) 2.5 MG tablet Take 1 tablet (2.5 mg total) by mouth daily. Take as directed by the anti-coag clinic 30 tablet 1   No current facility-administered medications for this visit.    Allergies:   Shellfish allergy, Azithromycin, Tamiflu [oseltamivir phosphate], and Albuterol   Social History:  The patient  reports that she has never smoked. She has never used smokeless tobacco. She reports that she does not drink alcohol and does not use drugs.   Family History:   family history includes CAD in her father and mother; Cancer (age of onset: 77) in her son.    Review of Systems: Review of Systems  Constitutional: Negative.   Respiratory: Negative.    Cardiovascular: Negative.   Gastrointestinal: Negative.   Musculoskeletal: Negative.    Neurological: Negative.   Psychiatric/Behavioral: Negative.    All other systems reviewed and are negative.  PHYSICAL EXAM: VS:  BP (!) 122/44 (BP Location: Left Arm, Patient Position: Sitting, Cuff Size: Normal)    Pulse 70    Ht 5\' 5"  (1.651 m)    Wt 131 lb 2 oz (59.5 kg)    SpO2 98%    BMI 21.82 kg/m  , BMI Body mass index is 21.82 kg/m. Constitutional:  oriented to person, place, and time. No distress.  HENT:  Head: Grossly normal Eyes:  no discharge. No scleral icterus.  Neck: No JVD, no carotid bruits  Cardiovascular: Regular rate and rhythm, no murmurs appreciated Pulmonary/Chest: Clear to auscultation bilaterally, no wheezes or rails Abdominal: Soft.  no distension.  no tenderness.  Musculoskeletal: Normal range of motion Neurological:  normal muscle tone. Coordination normal. No atrophy Skin: Skin warm and dry Psychiatric: normal affect, pleasant  Recent Labs: 08/06/2021: B Natriuretic Peptide 555.3 08/23/2021: ALT 64; BUN 13; Creatinine, Ser 0.58; Hemoglobin 10.3; Magnesium 2.1; Platelets 277; Potassium 3.3; Sodium 133    Lipid Panel Lab Results  Component Value Date   CHOL 100 08/07/2021   HDL 44 08/07/2021   LDLCALC 47 08/07/2021   TRIG 46 08/07/2021      Wt Readings from Last 3 Encounters:  08/26/21 131 lb 2 oz (59.5 kg)  08/23/21 129 lb (58.5 kg)  08/09/21 127 lb 11.2 oz (57.9 kg)     ASSESSMENT AND PLAN:  Paroxysmal atrial fibrillation (HCC) -  Atrial fibrillation noted on EKG November 2022, she is on warfarin,  Remains in atrial fibrillation --Likely contributing to her shortness of breath -She is hoping to avoid TEE, Will suggest INR greater than 2 for 4 weeks then meet in clinic, if still in atrial fibrillation will arrange either restart of amiodarone with cardioversion   Shortness of breath Likely multifactorial in the setting of atrial fibrillation, moderate mitral valve stenosis, numerous hospitalizations, recent recovery from bronchitis,  bedbound for days to weeks  PAD/carotid stenosis Prior stenting,  Cholesterol at goal  Essential hypertension Blood pressure is well controlled on today's visit. No changes made to the medications.  Chronic dizziness Previously seen by vestibular PT  Mitral valve stenosis Moderate mitral valve stenosis by TEE Echo on annual basis  Centrilobular emphysema (HCC) Stable, followed by pulmonary, has mucous plugging on CT Recommend she continue Mucinex and nebulizers  Hyperlipidemia Cholesterol is at goal on the current lipid regimen. No changes to the medications were made.    Total encounter time more than 35 minutes  Greater than 50% was spent in counseling and coordination  of care with the patient     No orders of the defined types were placed in this encounter.     Signed, Esmond Plants, M.D., Ph.D. 08/26/2021  [address], [place]

## 2021-08-26 ENCOUNTER — Ambulatory Visit: Payer: Medicare Other | Admitting: Cardiovascular Disease

## 2021-08-26 ENCOUNTER — Encounter: Payer: Self-pay | Admitting: Cardiovascular Disease

## 2021-08-26 ENCOUNTER — Other Ambulatory Visit: Payer: Self-pay

## 2021-08-26 VITALS — BP 122/44 | HR 70 | Ht 65.0 in | Wt 131.1 lb

## 2021-08-26 DIAGNOSIS — I480 Paroxysmal atrial fibrillation: Secondary | ICD-10-CM | POA: Diagnosis not present

## 2021-08-26 DIAGNOSIS — I6523 Occlusion and stenosis of bilateral carotid arteries: Secondary | ICD-10-CM | POA: Diagnosis not present

## 2021-08-26 DIAGNOSIS — I25118 Atherosclerotic heart disease of native coronary artery with other forms of angina pectoris: Secondary | ICD-10-CM | POA: Diagnosis not present

## 2021-08-26 DIAGNOSIS — I050 Rheumatic mitral stenosis: Secondary | ICD-10-CM

## 2021-08-26 DIAGNOSIS — I10 Essential (primary) hypertension: Secondary | ICD-10-CM | POA: Diagnosis not present

## 2021-08-26 DIAGNOSIS — I272 Pulmonary hypertension, unspecified: Secondary | ICD-10-CM

## 2021-08-26 MED ORDER — POTASSIUM CHLORIDE ER 10 MEQ PO TBCR: 20.0000 meq | EXTENDED_RELEASE_TABLET | Freq: Every day | ORAL | 3 refills | Status: DC

## 2021-08-26 MED ORDER — FUROSEMIDE 40 MG PO TABS: 40.0000 mg | ORAL_TABLET | Freq: Every day | ORAL | 3 refills | Status: DC

## 2021-08-26 NOTE — Patient Instructions (Addendum)
Cut back on fluids  Medication Instructions:  Jan 16th, hold the aspirin Stay on warfarin and plavix  Okay to take extra lasix in the evenings as needed for SOB (sparingly) Take an extra potassium with extra lasix  Please increase the potassium from a 10 meq to a 20 meq daily  If you need a refill on your cardiac medications before your next appointment, please call your pharmacy.   Lab work: No new labs needed  Testing/Procedures: No new testing needed  Follow-Up: At [facility], you and your health needs are our priority.  As part of our continuing mission to provide you with exceptional heart care, we have created designated Provider Care Teams.  These Care Teams include your primary Cardiologist (physician) and Advanced Practice Providers (APPs -  Physician Assistants and Nurse Practitioners) who all work together to provide you with the care you need, when you need it.  You will need a follow up appointment in 1 month  Providers on your designated Care Team:   Murray Hodgkins, NP Christell Faith, PA-C Cadence Kathlen Mody, [place]  COVID-19 Vaccine Information can be found at: [email or web address].uk For questions related to vaccine distribution or appointments, please email [email or web address] or call [phone].

## 2021-08-26 NOTE — Telephone Encounter (Signed)
Patient has appt at 10am today with Dr. Rockey Situ. Will you please refill if appropriate? Thank you!

## 2021-08-28 ENCOUNTER — Other Ambulatory Visit: Payer: Self-pay

## 2021-08-28 ENCOUNTER — Ambulatory Visit (INDEPENDENT_AMBULATORY_CARE_PROVIDER_SITE_OTHER): Payer: Medicare Other

## 2021-08-28 DIAGNOSIS — Z5181 Encounter for therapeutic drug level monitoring: Secondary | ICD-10-CM | POA: Diagnosis not present

## 2021-08-28 DIAGNOSIS — I050 Rheumatic mitral stenosis: Secondary | ICD-10-CM

## 2021-08-28 DIAGNOSIS — I480 Paroxysmal atrial fibrillation: Secondary | ICD-10-CM | POA: Diagnosis not present

## 2021-08-28 LAB — POCT INR: INR: 2.4 (ref 2.0–3.0)

## 2021-08-28 MED ORDER — WARFARIN SODIUM 2.5 MG PO TABS: 2.5000 mg | ORAL_TABLET | Freq: Every day | ORAL | 1 refills | Status: DC

## 2021-08-28 NOTE — Patient Instructions (Signed)
-   continue warfarin dosage of 1 tablet every day - Recheck INR in 4 weeks

## 2021-09-02 ENCOUNTER — Ambulatory Visit (INDEPENDENT_AMBULATORY_CARE_PROVIDER_SITE_OTHER): Payer: Medicare Other | Admitting: Family Medicine

## 2021-09-02 ENCOUNTER — Other Ambulatory Visit: Payer: Self-pay

## 2021-09-02 VITALS — BP 146/52 | HR 80 | Temp 97.8°F | Wt 127.0 lb

## 2021-09-02 DIAGNOSIS — J4540 Moderate persistent asthma, uncomplicated: Secondary | ICD-10-CM

## 2021-09-02 DIAGNOSIS — I25118 Atherosclerotic heart disease of native coronary artery with other forms of angina pectoris: Secondary | ICD-10-CM | POA: Diagnosis not present

## 2021-09-02 MED ORDER — LEVALBUTEROL TARTRATE 45 MCG/ACT IN AERO: 2.0000 | INHALATION_SPRAY | Freq: Four times a day (QID) | RESPIRATORY_TRACT | 3 refills | Status: DC | PRN

## 2021-09-02 NOTE — Progress Notes (Signed)
Established patient visit   Patient: Molly Hodges   DOB: Jul 12, 1932   88 y.o. Female  MRN: [id number] Visit Date: 09/02/2021  Today's healthcare provider: Lelon Huh, MD   No chief complaint on file.  Subjective    HPI  Follow up Hospitalization  Patient was admitted to [facility] on 08/06/2021 and discharged on 08/09/2021. She was treated for NSTEMI. Treatment for this included See hospital note. Telephone follow up was done on N/A She did have follow up with Dr. Rockey Situ on 08/26/2021. She had ER visit on 08/23/2021 with following labs  Admission on 08/23/2021, Discharged on 08/23/2021  Component Date Value Ref Range Status   WBC 08/23/2021 14.5 (H)  4.0 - 10.5 K/uL Final   RBC 08/23/2021 4.29  3.87 - 5.11 MIL/uL Final   Hemoglobin 08/23/2021 10.3 (L)  12.0 - 15.0 g/dL Final   HCT 08/23/2021 32.4 (L)  36.0 - 46.0 % Final   MCV 08/23/2021 75.5 (L)  80.0 - 100.0 fL Final   MCH 08/23/2021 24.0 (L)  26.0 - 34.0 pg Final   MCHC 08/23/2021 31.8  30.0 - 36.0 g/dL Final   RDW 08/23/2021 15.6 (H)  11.5 - 15.5 % Final   Platelets 08/23/2021 277  150 - 400 K/uL Final   Sodium 08/23/2021 133 (L)  135 - 145 mmol/L Final   Potassium 08/23/2021 3.3 (L)  3.5 - 5.1 mmol/L Final   Chloride 08/23/2021 96 (L)  98 - 111 mmol/L Final   CO2 08/23/2021 26  22 - 32 mmol/L Final   Glucose, Bld 08/23/2021 129 (H)  70 - 99 mg/dL Final   BUN 08/23/2021 13  8 - 23 mg/dL Final   Creatinine, Ser 08/23/2021 0.58  0.44 - 1.00 mg/dL Final   Calcium 08/23/2021 8.4 (L)  8.9 - 10.3 mg/dL Final   Total Protein 08/23/2021 6.6  6.5 - 8.1 g/dL Final   Albumin 08/23/2021 3.3 (L)  3.5 - 5.0 g/dL Final   AST 08/23/2021 48 (H)  15 - 41 U/L Final   ALT 08/23/2021 64 (H)  0 - 44 U/L Final   Alkaline Phosphatase 08/23/2021 84  38 - 126 U/L Final   Total Bilirubin 08/23/2021 0.9  0.3 - 1.2 mg/dL Final   GFR, Estimated 08/23/2021 >60  >60 mL/min Final   Anion gap 08/23/2021 11  5 - 15 Final   Performed at  [facility], [address] [place], [place] [address]   Troponin I (High Sensitivity) 08/23/2021 18 (H)  <18 ng/L Final   Prothrombin Time 08/23/2021 24.6 (H)  11.4 - 15.2 seconds Final   INR 08/23/2021 2.2 (H)  0.8 - 1.2 Final   Magnesium 08/23/2021 2.1  1.7 - 2.4 mg/dL Final   Performed at [facility], [address] [place], [place] [address]   Procalcitonin 08/23/2021 <0.10  ng/mL Final   Troponin I (High Sensitivity) 08/23/2021 17  <18 ng/L Final    She reports excellent compliance with treatment. She reports this condition is stayed the same.  Pt states she is still very fatigued, short of breath since MI. She has nebulizer prescribed by Dr. Duwayne Heck and an old prescription for Albuterol HFA. She states the albuterol HFA makes her nervous, but she needs a refill.   ----------------------------------------------------------------------------------------- -   Medications: Outpatient Medications Prior to Visit  Medication Sig   albuterol (VENTOLIN HFA) 108 (90 Base) MCG/ACT inhaler Inhale 2 puffs into the lungs every 6 (six) hours as needed  for wheezing or shortness of breath.   aspirin EC 81 MG tablet Take 1 tablet (81 mg total) by mouth daily for 24 days. End on 09/02/2021.   Calcium Carbonate-Vitamin D 600-400 MG-UNIT tablet Take 1 tablet by mouth daily.   cholecalciferol (VITAMIN D) 1000 UNITS tablet Take 1,000 Units by mouth daily.   clopidogrel (PLAVIX) 75 MG tablet Take 1 tablet (75 mg total) by mouth daily.   diltiazem (CARDIZEM CD) 180 MG 24 hr capsule TAKE 1 CAPSULE EVERY DAY   dorzolamide (TRUSOPT) 2 % ophthalmic solution Place 1 drop into both eyes 2 (two) times daily.    ezetimibe (ZETIA) 10 MG tablet TAKE 1 TABLET BY MOUTH DAILY.   furosemide (LASIX) 40 MG tablet Take 1 tablet (40 mg total) by mouth daily. Take extra tab in the evening (2 pm) for swelling or shortness of breath)   ipratropium-albuterol (DUONEB) 0.5-2.5 (3) MG/3ML SOLN Take 3 mLs by  nebulization every 4 (four) hours as needed.   isosorbide mononitrate (IMDUR) 30 MG 24 hr tablet Take 1 tablet (30 mg total) by mouth daily.   latanoprost (XALATAN) 0.005 % ophthalmic solution Place 1 drop into both eyes at bedtime.    magnesium oxide (MAG-OX) 400 MG tablet Take 400 mg by mouth daily.   Multiple Vitamins-Minerals (PRESERVISION AREDS 2+MULTI VIT) CAPS Take 2 capsules by mouth daily.   nitroGLYCERIN (NITROSTAT) 0.4 MG SL tablet Place 1 tablet (0.4 mg total) under the tongue every 5 (five) minutes as needed (for chest pain or shortness of breath).   potassium chloride (KLOR-CON) 10 MEQ tablet Take 2 tablets (20 mEq total) by mouth daily. Take an extra 20 mEq when taking extra lasix   pregabalin (LYRICA) 50 MG capsule TAKE 1 CAPSULE BY MOUTH 2 TIMES DAILY   rosuvastatin (CRESTOR) 20 MG tablet TAKE 1 TABLET BY MOUTH DAILY.   vitamin C (ASCORBIC ACID) 500 MG tablet Take 500 mg by mouth daily.   vitamin E 100 UNIT capsule Take 200 Units by mouth daily.   warfarin (COUMADIN) 2.5 MG tablet Take 1 tablet (2.5 mg total) by mouth daily. Take as directed by the anti-coag clinic   No facility-administered medications prior to visit.    Review of Systems  Constitutional:  Positive for fatigue. Negative for activity change, appetite change, chills, diaphoresis, fever and unexpected weight change.  Respiratory:  Positive for shortness of breath. Negative for apnea, cough, choking, chest tightness, wheezing and stridor.   Cardiovascular: Negative.   Gastrointestinal: Negative.   Neurological:  Positive for weakness. Negative for dizziness, light-headedness and headaches.      Objective    BP (!) 146/52 (BP Location: Right Arm, Patient Position: Sitting, Cuff Size: Normal)    Pulse 80    Temp 97.8 F (36.6 C) (Oral)    Wt 127 lb (57.6 kg)    SpO2 99%    BMI 21.13 kg/m    Physical Exam    General: Appearance:    Well developed, well nourished female in no acute distress  Eyes:     PERRL, conjunctiva/corneas clear, EOM's intact       Lungs:     Bibasilar crackle c/w atelectasis seen on recent Xray, otherwise clear, respirations unlabored  Heart:    Normal heart rate. Irregularly irregular rhythm.  2/6 blowing, holosystolic murmur at apex 2/6 low pitched, rumbling, decrescendo, diastolic murmur at apex   MS:   All extremities are intact.    Neurologic:   Awake, alert, oriented x 3. No  apparent focal neurological defect.         Assessment & Plan     1. Coronary artery disease of native artery of native heart with stable angina pectoris [facility]) Fatigued with DOE since NSTEMI in December, otherwise stable. Continue current medications.  Follow up Dr. Rockey Situ as scheduled in February.   2. Moderate persistent asthma, unspecified whether complicated She states the albuterol makes her nervous. Will change to levalbuterol [facility] HFA) 45 MCG/ACT inhaler; Inhale 2 puffs into the lungs every 6 (six) hours as needed for wheezing.  Dispense: 1 each; Refill: 3      The entirety of the information documented in the History of Present Illness, Review of Systems and Physical Exam were personally obtained by me. Portions of this information were initially documented by the CMA and reviewed by me for thoroughness and accuracy.     Lelon Huh, MD  [facility] [phone] (phone) [phone] (fax)  [place]

## 2021-09-21 DIAGNOSIS — R0902 Hypoxemia: Secondary | ICD-10-CM | POA: Diagnosis not present

## 2021-09-25 ENCOUNTER — Other Ambulatory Visit: Payer: Self-pay

## 2021-09-25 ENCOUNTER — Ambulatory Visit (INDEPENDENT_AMBULATORY_CARE_PROVIDER_SITE_OTHER): Payer: Medicare Other

## 2021-09-25 DIAGNOSIS — Z5181 Encounter for therapeutic drug level monitoring: Secondary | ICD-10-CM

## 2021-09-25 DIAGNOSIS — I050 Rheumatic mitral stenosis: Secondary | ICD-10-CM | POA: Diagnosis not present

## 2021-09-25 DIAGNOSIS — I480 Paroxysmal atrial fibrillation: Secondary | ICD-10-CM | POA: Diagnosis not present

## 2021-09-25 LAB — POCT INR: INR: 3.2 — AB (ref 2.0–3.0)

## 2021-09-25 MED ORDER — WARFARIN SODIUM 2.5 MG PO TABS: 2.5000 mg | ORAL_TABLET | Freq: Every day | ORAL | 1 refills | Status: DC

## 2021-09-25 NOTE — Patient Instructions (Signed)
-   take 1/2 tablet tonight, then  - continue warfarin dosage of 1 tablet every day - Recheck INR in 4 weeks

## 2021-09-29 NOTE — Progress Notes (Signed)
Cardiology Office Note  Date:  09/30/2021   ID:  Molly Hodges, Molly Hodges 09/25/1931, MRN [id number]  PCP:  Birdie Sons, MD   Chief Complaint  Patient presents with   1 month follow up     Patient c/o irregular heartbeats, shortness of breath that comes and goes and dizziness. Medications reviewed by the patient verbally.     HPI:  Molly Hodges is a 88 y.o. female with  Paroxysmal Afib,  (CHADS2VASc of 4 - HTN, age x 2, female)  COPD, (never smoked) asthma, prior episodes of PNA,   hospital admission from 1/30 to 09/21/15,  HTN  Mitral valve stenosis, moderate December 2021 Pericardial effusion mild to moderate December 2021 PAD, carotid stenosis, high-grade on the right, stent placed Chronic dizziness 05/2020 started, last couple of weeks at a time, seen by ENT/neurology who presents  Follow-up for her atrial fibrillation,Previous cardioversion, carotid stenosis  Last seen by myself in clinic  1/23 Normal sinus rhythm October 2022 atrial fibrillation November 2022  Cath 08/02/21  Severe single-vessel coronary artery disease with 80-90% ostial through mid LAD and proximal/mid D1 stenoses, as seen on recent diagnostic catheterization. Successful OCT-guided PCI to ostial through mid LAD using Onyx Frontier 2.75 x 26 mm drug-eluting stent with 0% residual stenosis and TIMI-3 flow. Successful PCI to D1 using Onyx Frontier 2.5 x 22 mm drug-eluting stent with 0% residual stenosis and TIMI-3 flow.  Cath 08/07/2021 for chest pain/bronchitis   1st Diag-1 lesion is 20% stenosed.   Non-stenotic Ost LAD to Mid LAD lesion was previously treated.   Non-stenotic 1st Diag-2 lesion was previously treated.   1.  Widely patent LAD/diagonal stents with no significant restenosis.    In the ER 08/23/21 for chest pain, enz neg Treated for bronchitis  Recent chest x-ray showing mucous plugging Reports that she is doing nebulizers  One month ago significant shortness of breath on exertion,  feels very tired, heavy in the chest Today doing better, not at baseline yet, mild fatigue,  One morning, Low BP at home 86 systolic Water and rest, came up to 100 On avg 185 systolic  EKG personally reviewed by myself on todays visit Shows atrial fibrillation raet 61 bpm  Other past medical history reviewed Echocardiogram July 25, 2020  EF 55 to 60%, mildly dilated left atrium, mild to moderate circumferential pericardial effusion, Mild to moderate mitral valve regurgitation moderate mitral valve stenosis gradient 10 mmHg Dilated IVC  Seen by Dr. Lucky Cowboy, episodes of dizziness High-grade right carotid artery stenosis estimated 80 to 99%  found to be in new onset Afib with RVR on 09/17/15 upon her arrival to [facility], pneumonia started on Eliquis given her stroke risk (CHADS2VASc of 4 - HTN, age x 2, female) and rate controlled.  Beta blockers were avoided given her asthma.  Started on thousand channel blocker    PMH:   has a past medical history of Aortic atherosclerosis ([place]), Asthma, CAD (coronary artery disease), Carotid artery disease ([place]), Community acquired pneumonia, Essential hypertension, Glaucoma, Hyperlipidemia, Mitral regurgitation, Mitral stenosis, Peripheral neuropathy, and Persistent atrial fibrillation ([place]) (09/17/2015).  PSH:    Past Surgical History:  Procedure Laterality Date   ABDOMINAL HYSTERECTOMY  1987   due to heavy bleeding   APPENDECTOMY     CARDIAC CATHETERIZATION     CAROTID PTA/STENT INTERVENTION Right 08/15/2020   Procedure: CAROTID PTA/STENT INTERVENTION;  Surgeon: Algernon Huxley, MD;  Location: [place] CV [facility];  Service: Cardiovascular;  Laterality: Right;  CATARACT EXTRACTION     CORONARY STENT INTERVENTION N/A 08/02/2021   Procedure: CORONARY STENT INTERVENTION;  Surgeon: Nelva Bush, MD;  Location: [place] [facility];  Service: Cardiovascular;  Laterality: N/A;   ELECTROPHYSIOLOGIC STUDY N/A 11/15/2015   Procedure: CARDIOVERSION;   Surgeon: Minna Merritts, MD;  Location: [facility] ORS;  Service: Cardiovascular;  Laterality: N/A;   INTRAVASCULAR IMAGING/OCT N/A 08/02/2021   Procedure: INTRAVASCULAR IMAGING/OCT;  Surgeon: Nelva Bush, MD;  Location: [place] [facility];  Service: Cardiovascular;  Laterality: N/A;   LEFT HEART CATH AND CORONARY ANGIOGRAPHY N/A 08/02/2021   Procedure: LEFT HEART CATH AND CORONARY ANGIOGRAPHY;  Surgeon: Nelva Bush, MD;  Location: [place] [facility];  Service: Cardiovascular;  Laterality: N/A;   LEFT HEART CATH AND CORONARY ANGIOGRAPHY N/A 08/07/2021   Procedure: LEFT HEART CATH AND CORONARY ANGIOGRAPHY;  Surgeon: Wellington Hampshire, MD;  Location: [place] [facility];  Service: Cardiovascular;  Laterality: N/A;   RIGHT/LEFT HEART CATH AND CORONARY ANGIOGRAPHY N/A 05/17/2021   Procedure: RIGHT/LEFT HEART CATH AND CORONARY ANGIOGRAPHY;  Surgeon: Nelva Bush, MD;  Location: [place] [facility];  Service: Cardiovascular;  Laterality: N/A;   TEE WITHOUT CARDIOVERSION N/A 06/13/2021   Procedure: TRANSESOPHAGEAL ECHOCARDIOGRAM (TEE);  Surgeon: Minna Merritts, MD;  Location: [facility] ORS;  Service: Cardiovascular;  Laterality: N/A;    Current Outpatient Medications  Medication Sig Dispense Refill   amiodarone (PACERONE) 200 MG tablet Take 200 mg by mouth daily.     Calcium Carbonate-Vitamin D 600-400 MG-UNIT tablet Take 1 tablet by mouth daily.     cholecalciferol (VITAMIN D) 1000 UNITS tablet Take 1,000 Units by mouth daily.     clopidogrel (PLAVIX) 75 MG tablet Take 1 tablet (75 mg total) by mouth daily. 90 tablet 3   diltiazem (CARDIZEM CD) 180 MG 24 hr capsule TAKE 1 CAPSULE EVERY DAY 30 capsule 11   dorzolamide (TRUSOPT) 2 % ophthalmic solution Place 1 drop into both eyes 2 (two) times daily.      ezetimibe (ZETIA) 10 MG tablet TAKE 1 TABLET BY MOUTH DAILY. 90 tablet 3   furosemide (LASIX) 40 MG tablet Take 1 tablet (40 mg total) by mouth daily. Take extra tab in the evening (2 pm)  for swelling or shortness of breath) 200 tablet 3   ipratropium-albuterol (DUONEB) 0.5-2.5 (3) MG/3ML SOLN Take 3 mLs by nebulization every 4 (four) hours as needed. 360 mL 1   isosorbide mononitrate (IMDUR) 30 MG 24 hr tablet Take 1 tablet (30 mg total) by mouth daily. 90 tablet 3   latanoprost (XALATAN) 0.005 % ophthalmic solution Place 1 drop into both eyes at bedtime.      levalbuterol (XOPENEX HFA) 45 MCG/ACT inhaler Inhale 2 puffs into the lungs every 6 (six) hours as needed for wheezing. 1 each 3   magnesium oxide (MAG-OX) 400 MG tablet Take 400 mg by mouth daily.     Multiple Vitamins-Minerals (PRESERVISION AREDS 2+MULTI VIT) CAPS Take 2 capsules by mouth daily.     nitroGLYCERIN (NITROSTAT) 0.4 MG SL tablet Place 1 tablet (0.4 mg total) under the tongue every 5 (five) minutes as needed (for chest pain or shortness of breath). 100 tablet 3   potassium chloride (KLOR-CON) 10 MEQ tablet Take 2 tablets (20 mEq total) by mouth daily. Take an extra 20 mEq when taking extra lasix 200 tablet 3   pregabalin (LYRICA) 50 MG capsule TAKE 1 CAPSULE BY MOUTH 2 TIMES DAILY 60 capsule 3   rosuvastatin (CRESTOR) 20 MG  tablet TAKE 1 TABLET BY MOUTH DAILY. 90 tablet 3   vitamin C (ASCORBIC ACID) 500 MG tablet Take 500 mg by mouth daily.     vitamin E 100 UNIT capsule Take 200 Units by mouth daily.     warfarin (COUMADIN) 2.5 MG tablet Take 1 tablet (2.5 mg total) by mouth daily. Take as directed by the anti-coag clinic 30 tablet 1   No current facility-administered medications for this visit.    Allergies:   Shellfish allergy, Azithromycin, Tamiflu [oseltamivir phosphate], and Albuterol   Social History:  The patient  reports that she has never smoked. She has never used smokeless tobacco. She reports that she does not drink alcohol and does not use drugs.   Family History:   family history includes CAD in her father and mother; Cancer (age of onset: 39) in her son.    Review of Systems: Review of  Systems  Constitutional: Negative.   Respiratory: Negative.    Cardiovascular: Negative.   Gastrointestinal: Negative.   Musculoskeletal: Negative.   Neurological: Negative.   Psychiatric/Behavioral: Negative.    All other systems reviewed and are negative.  PHYSICAL EXAM: VS:  BP (!) 126/54 (BP Location: Left Arm, Patient Position: Sitting, Cuff Size: Normal)    Pulse 61    Ht 5\' 5"  (1.651 m)    Wt 123 lb (55.8 kg)    SpO2 99%    BMI 20.47 kg/m  , BMI Body mass index is 20.47 kg/m. Constitutional:  oriented to person, place, and time. No distress.  HENT:  Head: Grossly normal Eyes:  no discharge. No scleral icterus.  Neck: No JVD, no carotid bruits  Cardiovascular: irreg irreg,  no murmurs appreciated Pulmonary/Chest: Clear to auscultation bilaterally, no wheezes or rails Abdominal: Soft.  no distension.  no tenderness.  Musculoskeletal: Normal range of motion Neurological:  normal muscle tone. Coordination normal. No atrophy Skin: Skin warm and dry Psychiatric: normal affect, pleasant   Recent Labs: 08/06/2021: B Natriuretic Peptide 555.3 08/23/2021: ALT 64; BUN 13; Creatinine, Ser 0.58; Hemoglobin 10.3; Magnesium 2.1; Platelets 277; Potassium 3.3; Sodium 133    Lipid Panel Lab Results  Component Value Date   CHOL 100 08/07/2021   HDL 44 08/07/2021   LDLCALC 47 08/07/2021   TRIG 46 08/07/2021      Wt Readings from Last 3 Encounters:  09/30/21 123 lb (55.8 kg)  09/02/21 127 lb (57.6 kg)  08/26/21 131 lb 2 oz (59.5 kg)     ASSESSMENT AND PLAN:  Paroxysmal atrial fibrillation (HCC) -  Atrial fibrillation noted on EKG November 2022, she is on warfarin,  Remains in atrial fibrillation Long discussion today, she has improved over the past month but stronger Decision made to hold off on cardioversion High risk of recurrent arrhythmia given mitral valve disease, dilated left atrium  Shortness of breath Slowly improving, BMP today Continue Lasix 40 daily with 1 or 2  extra in the afternoon per week  PAD/carotid stenosis Prior stenting,  Cholesterol at goal No changes  Essential hypertension Blood pressure somewhat labile Recommend she move the isosorbide to evening diltiazem in the morning Monitor pressure  Chronic dizziness Previously seen by vestibular PT  Mitral valve stenosis Moderate mitral valve stenosis by TEE Also with mitral valve regurgitation Weight, sedentary, no aggressive intervention at this time  Centrilobular emphysema (HCC) Breathing much better after recent bronchitis  Hyperlipidemia Cholesterol at goal no changes made    Total encounter time more than 30 minutes  Greater than 50%  was spent in counseling and coordination of care with the patient     Orders Placed This Encounter  Procedures   EKG 12-Lead      Signed, Esmond Plants, M.D., Ph.D. 09/30/2021  [place], [place]

## 2021-09-30 ENCOUNTER — Other Ambulatory Visit: Payer: Self-pay

## 2021-09-30 ENCOUNTER — Encounter: Payer: Self-pay | Admitting: Cardiovascular Disease

## 2021-09-30 ENCOUNTER — Ambulatory Visit: Payer: Medicare Other | Admitting: Cardiovascular Disease

## 2021-09-30 ENCOUNTER — Other Ambulatory Visit
Admission: RE | Admit: 2021-09-30 | Discharge: 2021-09-30 | Disposition: A | Discharge status: Home / Self Care | Payer: Medicare Other | Source: Ambulatory Visit | Attending: Cardiovascular Disease | Admitting: Cardiovascular Disease

## 2021-09-30 VITALS — BP 126/54 | HR 61 | Ht 65.0 in | Wt 123.0 lb

## 2021-09-30 DIAGNOSIS — I272 Pulmonary hypertension, unspecified: Secondary | ICD-10-CM

## 2021-09-30 DIAGNOSIS — I480 Paroxysmal atrial fibrillation: Secondary | ICD-10-CM | POA: Diagnosis not present

## 2021-09-30 DIAGNOSIS — I050 Rheumatic mitral stenosis: Secondary | ICD-10-CM

## 2021-09-30 DIAGNOSIS — I10 Essential (primary) hypertension: Secondary | ICD-10-CM

## 2021-09-30 DIAGNOSIS — I6523 Occlusion and stenosis of bilateral carotid arteries: Secondary | ICD-10-CM

## 2021-09-30 DIAGNOSIS — Z79899 Other long term (current) drug therapy: Secondary | ICD-10-CM

## 2021-09-30 DIAGNOSIS — I25118 Atherosclerotic heart disease of native coronary artery with other forms of angina pectoris: Secondary | ICD-10-CM

## 2021-09-30 LAB — BASIC METABOLIC PANEL
Anion gap: 8 (ref 5–15)
BUN: 16 mg/dL (ref 8–23)
CO2: 26 mmol/L (ref 22–32)
Calcium: 8.8 mg/dL — ABNORMAL LOW (ref 8.9–10.3)
Chloride: 101 mmol/L (ref 98–111)
Creatinine, Ser: 0.67 mg/dL (ref 0.44–1.00)
GFR, Estimated: >60 mL/min (ref >60)
Glucose, Bld: 100 mg/dL — ABNORMAL HIGH (ref 70–99)
Potassium: 3.9 mmol/L (ref 3.5–5.1)
Sodium: 135 mmol/L (ref 135–145)

## 2021-09-30 MED ORDER — ISOSORBIDE MONONITRATE ER 30 MG PO TB24: 30.0000 mg | ORAL_TABLET | Freq: Every evening | ORAL | 3 refills | Status: DC

## 2021-09-30 NOTE — Patient Instructions (Addendum)
Medication Instructions:  Please move the isosorbide to the PM  Stop amiodarone  If you need a refill on your cardiac medications before your next appointment, please call your pharmacy.   Lab work: BMP Please go to [place] to have drawn  Testing/Procedures: No new testing needed  Follow-Up: At [facility], you and your health needs are our priority.  As part of our continuing mission to provide you with exceptional heart care, we have created designated Provider Care Teams.  These Care Teams include your primary Cardiologist (physician) and Advanced Practice Providers (APPs -  Physician Assistants and Nurse Practitioners) who all work together to provide you with the care you need, when you need it.  You will need a follow up appointment in 3 months  Providers on your designated Care Team:   Murray Hodgkins, NP Christell Faith, PA-C Cadence Kathlen Mody, [place]  COVID-19 Vaccine Information can be found at: [email or web address] For questions related to vaccine distribution or appointments, please email [email or web address] or call [phone].

## 2021-10-15 ENCOUNTER — Ambulatory Visit (INDEPENDENT_AMBULATORY_CARE_PROVIDER_SITE_OTHER): Payer: Medicare Other

## 2021-10-15 ENCOUNTER — Other Ambulatory Visit: Payer: Self-pay

## 2021-10-15 VITALS — BP 120/60 | HR 49 | Temp 97.7°F | Wt 122.7 lb

## 2021-10-15 DIAGNOSIS — Z0000 Encounter for general adult medical examination without abnormal findings: Secondary | ICD-10-CM

## 2021-10-15 NOTE — Patient Instructions (Signed)
[name] , Thank you for taking time to come for your Medicare Wellness Visit. I appreciate your ongoing commitment to your health goals. Please review the following plan we discussed and let me know if I can assist you in the future.   Screening recommendations/referrals: Colonoscopy: aged out Mammogram: aged out Bone Density: aged out Recommended yearly ophthalmology/optometry visit for glaucoma screening and checkup Recommended yearly dental visit for hygiene and checkup  Vaccinations: Influenza vaccine: 05/29/21 Pneumococcal vaccine: 05/30/14 Tdap vaccine: n/d Shingles vaccine: Shingrix 05/05/18, 07/20/18   Covid-19:10/13/19, 11/03/19, 07/26/20  Advanced directives: no  Conditions/risks identified: none  Next appointment: Follow up in one year for your annual wellness visit 10/16/22 @ 1pm in person   Preventive Care 86 Years and Older, Female Preventive care refers to lifestyle choices and visits with your health care provider that can promote health and wellness. What does preventive care include? A yearly physical exam. This is also called an annual well check. Dental exams once or twice a year. Routine eye exams. Ask your health care provider how often you should have your eyes checked. Personal lifestyle choices, including: Daily care of your teeth and gums. Regular physical activity. Eating a healthy diet. Avoiding tobacco and drug use. Limiting alcohol use. Practicing safe sex. Taking low-dose aspirin every day. Taking vitamin and mineral supplements as recommended by your health care provider. What happens during an annual well check? The services and screenings done by your health care provider during your annual well check will depend on your age, overall health, lifestyle risk factors, and family history of disease. Counseling  Your health care provider may ask you questions about your: Alcohol use. Tobacco use. Drug use. Emotional well-being. Home and  relationship well-being. Sexual activity. Eating habits. History of falls. Memory and ability to understand (cognition). Work and work Statistician. Reproductive health. Screening  You may have the following tests or measurements: Height, weight, and BMI. Blood pressure. Lipid and cholesterol levels. These may be checked every 5 years, or more frequently if you are over 68 years old. Skin check. Lung cancer screening. You may have this screening every year starting at age 53 if you have a 30-pack-year history of smoking and currently smoke or have quit within the past 15 years. Fecal occult blood test (FOBT) of the stool. You may have this test every year starting at age 33. Flexible sigmoidoscopy or colonoscopy. You may have a sigmoidoscopy every 5 years or a colonoscopy every 10 years starting at age 71. Hepatitis C blood test. Hepatitis B blood test. Sexually transmitted disease (STD) testing. Diabetes screening. This is done by checking your blood sugar (glucose) after you have not eaten for a while (fasting). You may have this done every 1-3 years. Bone density scan. This is done to screen for osteoporosis. You may have this done starting at age 23. Mammogram. This may be done every 1-2 years. Talk to your health care provider about how often you should have regular mammograms. Talk with your health care provider about your test results, treatment options, and if necessary, the need for more tests. Vaccines  Your health care provider may recommend certain vaccines, such as: Influenza vaccine. This is recommended every year. Tetanus, diphtheria, and acellular pertussis (Tdap, Td) vaccine. You may need a Td booster every 10 years. Zoster vaccine. You may need this after age 68. Pneumococcal 13-valent conjugate (PCV13) vaccine. One dose is recommended after age 86. Pneumococcal polysaccharide (PPSV23) vaccine. One dose is recommended after age 10. Talk  to your health care provider  about which screenings and vaccines you need and how often you need them. This information is not intended to replace advice given to you by your health care provider. Make sure you discuss any questions you have with your health care provider. Document Released: 08/31/2015 Document Revised: 04/23/2016 Document Reviewed: 06/05/2015 Elsevier Interactive Patient Education  2017 [place] Prevention in the Home Falls can cause injuries. They can happen to people of all ages. There are many things you can do to make your home safe and to help prevent falls. What can I do on the outside of my home? Regularly fix the edges of walkways and driveways and fix any cracks. Remove anything that might make you trip as you walk through a door, such as a raised step or threshold. Trim any bushes or trees on the path to your home. Use bright outdoor lighting. Clear any walking paths of anything that might make someone trip, such as rocks or tools. Regularly check to see if handrails are loose or broken. Make sure that both sides of any steps have handrails. Any raised decks and porches should have guardrails on the edges. Have any leaves, snow, or ice cleared regularly. Use sand or salt on walking paths during winter. Clean up any spills in your garage right away. This includes oil or grease spills. What can I do in the bathroom? Use night lights. Install grab bars by the toilet and in the tub and shower. Do not use towel bars as grab bars. Use non-skid mats or decals in the tub or shower. If you need to sit down in the shower, use a plastic, non-slip stool. Keep the floor dry. Clean up any water that spills on the floor as soon as it happens. Remove soap buildup in the tub or shower regularly. Attach bath mats securely with double-sided non-slip rug tape. Do not have throw rugs and other things on the floor that can make you trip. What can I do in the bedroom? Use night lights. Make sure  that you have a light by your bed that is easy to reach. Do not use any sheets or blankets that are too big for your bed. They should not hang down onto the floor. Have a firm chair that has side arms. You can use this for support while you get dressed. Do not have throw rugs and other things on the floor that can make you trip. What can I do in the kitchen? Clean up any spills right away. Avoid walking on wet floors. Keep items that you use a lot in easy-to-reach places. If you need to reach something above you, use a strong step stool that has a grab bar. Keep electrical cords out of the way. Do not use floor polish or wax that makes floors slippery. If you must use wax, use non-skid floor wax. Do not have throw rugs and other things on the floor that can make you trip. What can I do with my stairs? Do not leave any items on the stairs. Make sure that there are handrails on both sides of the stairs and use them. Fix handrails that are broken or loose. Make sure that handrails are as long as the stairways. Check any carpeting to make sure that it is firmly attached to the stairs. Fix any carpet that is loose or worn. Avoid having throw rugs at the top or bottom of the stairs. If you do have throw rugs,  attach them to the floor with carpet tape. Make sure that you have a light switch at the top of the stairs and the bottom of the stairs. If you do not have them, ask someone to add them for you. What else can I do to help prevent falls? Wear shoes that: Do not have high heels. Have rubber bottoms. Are comfortable and fit you well. Are closed at the toe. Do not wear sandals. If you use a stepladder: Make sure that it is fully opened. Do not climb a closed stepladder. Make sure that both sides of the stepladder are locked into place. Ask someone to hold it for you, if possible. Clearly mark and make sure that you can see: Any grab bars or handrails. First and last steps. Where the edge of  each step is. Use tools that help you move around (mobility aids) if they are needed. These include: Canes. Walkers. Scooters. Crutches. Turn on the lights when you go into a dark area. Replace any light bulbs as soon as they burn out. Set up your furniture so you have a clear path. Avoid moving your furniture around. If any of your floors are uneven, fix them. If there are any pets around you, be aware of where they are. Review your medicines with your doctor. Some medicines can make you feel dizzy. This can increase your chance of falling. Ask your doctor what other things that you can do to help prevent falls. This information is not intended to replace advice given to you by your health care provider. Make sure you discuss any questions you have with your health care provider. Document Released: 05/31/2009 Document Revised: 01/10/2016 Document Reviewed: 09/08/2014 Elsevier Interactive Patient Education  2017 [employer].

## 2021-10-15 NOTE — Progress Notes (Signed)
Subjective:   Molly Hodges is a 88 y.o. female who presents for Medicare Annual (Subsequent) preventive examination.  Review of Systems           Objective:    Today's Vitals   10/15/21 1306  BP: 120/60  Pulse: (!) 49  Temp: 97.7 F (36.5 C)  TempSrc: Oral  SpO2: 100%  Weight: 122 lb 11.2 oz (55.7 kg)   Body mass index is 20.42 kg/m.  Advanced Directives 08/23/2021 08/07/2021 08/06/2021 08/02/2021 06/13/2021 05/17/2021 10/09/2020  Does Patient Have a Medical Advance Directive? No No Yes Yes No Yes Yes  Type of Academic librarian;Living will - Healthcare Power of [place];Living will - Living will [place];Living will  Does patient want to make changes to medical advance directive? No - Patient declined No - Patient declined No - Patient declined No - Patient declined - No - Patient declined -  Copy of [place] in Chart? No - copy requested - No - copy requested No - copy requested - - No - copy requested  Would patient like information on creating a medical advance directive? No - Patient declined No - Patient declined - - No - Patient declined - -    Current Medications (verified) Outpatient Encounter Medications as of 10/15/2021  Medication Sig   Calcium Carbonate-Vitamin D 600-400 MG-UNIT tablet Take 1 tablet by mouth daily.   cholecalciferol (VITAMIN D) 1000 UNITS tablet Take 1,000 Units by mouth daily.   clopidogrel (PLAVIX) 75 MG tablet Take 1 tablet (75 mg total) by mouth daily.   diltiazem (CARDIZEM CD) 180 MG 24 hr capsule TAKE 1 CAPSULE EVERY DAY   dorzolamide (TRUSOPT) 2 % ophthalmic solution Place 1 drop into both eyes 2 (two) times daily.    enoxaparin (LOVENOX) 60 MG/0.6ML injection SMARTSIG:0.6 Milliliter(s) SUB-Q Every 12 Hours   ezetimibe (ZETIA) 10 MG tablet TAKE 1 TABLET BY MOUTH DAILY.   furosemide (LASIX) 40 MG tablet Take 1 tablet (40 mg total) by mouth  daily. Take extra tab in the evening (2 pm) for swelling or shortness of breath)   ipratropium-albuterol (DUONEB) 0.5-2.5 (3) MG/3ML SOLN Take 3 mLs by nebulization every 4 (four) hours as needed.   isosorbide mononitrate (IMDUR) 30 MG 24 hr tablet Take 1 tablet (30 mg total) by mouth every evening.   latanoprost (XALATAN) 0.005 % ophthalmic solution Place 1 drop into both eyes at bedtime.    levalbuterol (XOPENEX HFA) 45 MCG/ACT inhaler Inhale 2 puffs into the lungs every 6 (six) hours as needed for wheezing.   magnesium oxide (MAG-OX) 400 MG tablet Take 400 mg by mouth daily.   Multiple Vitamins-Minerals (PRESERVISION AREDS 2+MULTI VIT) CAPS Take 2 capsules by mouth daily.   nitroGLYCERIN (NITROSTAT) 0.4 MG SL tablet Place 1 tablet (0.4 mg total) under the tongue every 5 (five) minutes as needed (for chest pain or shortness of breath).   potassium chloride (KLOR-CON M) 10 MEQ tablet Take 10 mEq by mouth daily.   potassium chloride (KLOR-CON) 10 MEQ tablet Take 2 tablets (20 mEq total) by mouth daily. Take an extra 20 mEq when taking extra lasix   pregabalin (LYRICA) 50 MG capsule TAKE 1 CAPSULE BY MOUTH 2 TIMES DAILY   rosuvastatin (CRESTOR) 20 MG tablet TAKE 1 TABLET BY MOUTH DAILY.   vitamin C (ASCORBIC ACID) 500 MG tablet Take 500 mg by mouth daily.   vitamin E 100 UNIT capsule Take 200  Units by mouth daily.   warfarin (COUMADIN) 2.5 MG tablet Take 1 tablet (2.5 mg total) by mouth daily. Take as directed by the anti-coag clinic   No facility-administered encounter medications on file as of 10/15/2021.    Allergies (verified) Shellfish allergy, Azithromycin, Tamiflu [oseltamivir phosphate], and Albuterol   History: Past Medical History:  Diagnosis Date   Aortic atherosclerosis ([place])    a. 05/2021 TEE: GrIII atheroma plaque involving the asc, transverse, and desc Ao.   Asthma    CAD (coronary artery disease)    a. 04/2021 Cath: LM nl, LAD 85p/m, D1 80, RI nl, LCX nl, RCA nl; b. 07/2021  PCI: pLAD (2.75x26 Onyx Frontier DES), D1 (2.5x22 Onyx Frontier DES).   Carotid artery disease ([place])    a. s/p R carotid stenting (23mm x 59mm x 4cm long Exact stent); b. 06/2021 U/S: RICA 16-01%, LICA 09-32%.   Community acquired pneumonia    Essential hypertension    Glaucoma    Hyperlipidemia    Mitral regurgitation    a. TTE 08/2015: EF 60-65%, normal wall motion, mild MR, mildly dilated left atrium measuring 40 mm, RVSF normal, PASP normal; b. 05/2021 TEE: Moderate MR.   Mitral stenosis    a. 05/2021 L/RHC: Sev MS w/ mean grad 13-39mmHg and MV area of 0.5-06.cm^2; b. 05/2021 TEE: EF 55-60%, no rwma, nl RV fxn, mod MR, mod MS (MV area by P1/2t: 1.61 cm^2 w/ mean grad of 67mmHg).   Peripheral neuropathy    Persistent atrial fibrillation ([place]) 09/17/2015   a. s/p DCCV 11/15/2015; b. CHADS2VASc => 4 (HTN, age x 2, female)--> warfarin; c. 06/2021 recurrent afib-->amio added.   Past Surgical History:  Procedure Laterality Date   ABDOMINAL HYSTERECTOMY  1987   due to heavy bleeding   APPENDECTOMY     CARDIAC CATHETERIZATION     CAROTID PTA/STENT INTERVENTION Right 08/15/2020   Procedure: CAROTID PTA/STENT INTERVENTION;  Surgeon: Algernon Huxley, MD;  Location: [facility];  Service: Cardiovascular;  Laterality: Right;   CATARACT EXTRACTION     CORONARY STENT INTERVENTION N/A 08/02/2021   Procedure: CORONARY STENT INTERVENTION;  Surgeon: Nelva Bush, MD;  Location: [place] [facility];  Service: Cardiovascular;  Laterality: N/A;   ELECTROPHYSIOLOGIC STUDY N/A 11/15/2015   Procedure: CARDIOVERSION;  Surgeon: Minna Merritts, MD;  Location: [facility];  Service: Cardiovascular;  Laterality: N/A;   INTRAVASCULAR IMAGING/OCT N/A 08/02/2021   Procedure: INTRAVASCULAR IMAGING/OCT;  Surgeon: Nelva Bush, MD;  Location: [place] [facility];  Service: Cardiovascular;  Laterality: N/A;   LEFT HEART CATH AND CORONARY ANGIOGRAPHY N/A 08/02/2021   Procedure: LEFT HEART CATH AND CORONARY  ANGIOGRAPHY;  Surgeon: Nelva Bush, MD;  Location: [place] [facility];  Service: Cardiovascular;  Laterality: N/A;   LEFT HEART CATH AND CORONARY ANGIOGRAPHY N/A 08/07/2021   Procedure: LEFT HEART CATH AND CORONARY ANGIOGRAPHY;  Surgeon: Wellington Hampshire, MD;  Location: [place] [facility];  Service: Cardiovascular;  Laterality: N/A;   RIGHT/LEFT HEART CATH AND CORONARY ANGIOGRAPHY N/A 05/17/2021   Procedure: RIGHT/LEFT HEART CATH AND CORONARY ANGIOGRAPHY;  Surgeon: Nelva Bush, MD;  Location: [place] [facility];  Service: Cardiovascular;  Laterality: N/A;   TEE WITHOUT CARDIOVERSION N/A 06/13/2021   Procedure: TRANSESOPHAGEAL ECHOCARDIOGRAM (TEE);  Surgeon: Minna Merritts, MD;  Location: [facility];  Service: Cardiovascular;  Laterality: N/A;   Family History  Problem Relation Age of Onset   CAD Mother    CAD Father    Cancer Son 56  lung cancer   Social History   Socioeconomic History   Marital status: Married    Spouse name: Not on file   Number of children: 4   Years of education: Not on file   Highest education level: 12th grade  Occupational History   Occupation: retired  Tobacco Use   Smoking status: Never   Smokeless tobacco: Never   Tobacco comments:    never   Vaping Use   Vaping Use: Never used  Substance and Sexual Activity   Alcohol use: No   Drug use: Never   Sexual activity: Not on file  Other Topics Concern   Not on file  Social History Narrative   Lives at home with Husband. Active and Independent at baseline.   Social Determinants of Health   Financial Resource Strain: Not on file  Food Insecurity: Not on file  Transportation Needs: Not on file  Physical Activity: Not on file  Stress: Not on file  Social Connections: Not on file    Tobacco Counseling Counseling given: Not Answered Tobacco comments: never    Clinical Intake:  Pre-visit preparation completed: Yes  Pain : No/denies pain     Nutritional Risks:  None Diabetes: No  How often do you need to have someone help you when you read instructions, pamphlets, or other written materials from your doctor or pharmacy?: 1 - Never  Diabetic?no  Interpreter Needed?: No  Information entered by :: Kirke Shaggy, LPN   Activities of Daily Living In your present state of health, do you have any difficulty performing the following activities: 08/06/2021 08/02/2021  Hearing? N N  Vision? N N  Difficulty concentrating or making decisions? N N  Walking or climbing stairs? Y Y  Dressing or bathing? N N  Doing errands, shopping? N N  Some recent data might be hidden    Patient Care Team: Birdie Sons, MD as PCP - General (Family Medicine) Rockey Situ, Kathlene November, MD as PCP - Cardiology (Cardiology) Minna Merritts, MD as Consulting Physician (Cardiology) Leandrew Koyanagi, MD as Referring Physician (Ophthalmology) Lucky Cowboy Erskine Squibb, MD as Referring Physician (Vascular Surgery)  Indicate any recent Medical Services you may have received from other than [facility] providers in the past year (date may be approximate).     Assessment:   This is a routine wellness examination for [place].  Hearing/Vision screen No results found.  Dietary issues and exercise activities discussed:     Goals Addressed   None    Depression Screen PHQ 2/9 Scores 11/02/2020 10/09/2020 09/17/2020 10/06/2019 10/19/2017 11/05/2016 11/05/2016  PHQ - 2 Score 0 0 0 0 0 0 0  PHQ- 9 Score 0 - 0 - - 0 -    Fall Risk Fall Risk  11/02/2020 10/09/2020 09/17/2020 07/03/2020 10/06/2019  Falls in the past year? 0 0 0 0 0  Number falls in past yr: 0 0 0 0 0  Injury with Fall? 0 0 0 0 0  Risk for fall due to : No Fall Risks - Impaired balance/gait - -  Follow up Falls evaluation completed - Falls evaluation completed Falls evaluation completed -    FALL RISK PREVENTION PERTAINING TO THE HOME:  Any stairs in or around the home? Yes  If so, are there any without handrails? No  Home  free of loose throw rugs in walkways, pet beds, electrical cords, etc? Yes  Adequate lighting in your home to reduce risk of falls? Yes   ASSISTIVE DEVICES UTILIZED TO PREVENT FALLS:  Life alert? No  Use of a cane, walker or w/c? Yes  Grab bars in the bathroom? Yes  Shower chair or bench in shower? Yes  Elevated toilet seat or a handicapped toilet? Yes   TIMED UP AND GO:  Was the test performed? Yes .  Length of time to ambulate 10 feet: 6 sec.   Gait slow and steady without use of assistive device  Cognitive Function:     6CIT Screen 11/05/2016  What Year? 0 points  What month? 0 points  What time? 0 points  Count back from 20 0 points  Months in reverse 4 points  Repeat phrase 0 points  Total Score 4    Immunizations Immunization History  Administered Date(s) Administered   Fluad Quad(high Dose 65+) 05/18/2019, 05/02/2020, 05/29/2021   Influenza, High Dose Seasonal PF 08/28/2017, 05/29/2018   PFIZER(Purple Top)SARS-COV-2 Vaccination 10/13/2019, 11/03/2019, 07/26/2020   Pneumococcal Conjugate-13 05/30/2014   Pneumococcal Polysaccharide-23 06/09/1985, 06/15/2006   Zoster Recombinat (Shingrix) 05/05/2018, 07/20/2018    TDAP status: Due, Education has been provided regarding the importance of this vaccine. Advised may receive this vaccine at local pharmacy or Health Dept. Aware to provide a copy of the vaccination record if obtained from local pharmacy or Health Dept. Verbalized acceptance and understanding.  Flu Vaccine status: Up to date  Pneumococcal vaccine status: Up to date  Covid-19 vaccine status: Completed vaccines  Qualifies for Shingles Vaccine? Yes   Zostavax completed No   Shingrix Completed?: Yes  Screening Tests Health Maintenance  Topic Date Due   DEXA SCAN  Never done   TETANUS/TDAP  Never done   COVID-19 Vaccine (4 - Booster for Pfizer series) 09/20/2020   Pneumonia Vaccine 77+ Years old  Completed   INFLUENZA VACCINE  Completed   Zoster  Vaccines- Shingrix  Completed   HPV VACCINES  Aged Out    Health Maintenance  Health Maintenance Due  Topic Date Due   DEXA SCAN  Never done   TETANUS/TDAP  Never done   COVID-19 Vaccine (4 - Booster for Pfizer series) 09/20/2020    Colorectal cancer screening: No longer required.   Mammogram status: No longer required due to age.   Lung Cancer Screening: (Low Dose CT Chest recommended if Age 20-80 years, 30 pack-year currently smoking OR have quit w/in 15years.) does not qualify.   Lung Cancer Screening Referral:   Additional Screening:  Hepatitis C Screening: does not qualify; Completed no  Vision Screening: Recommended annual ophthalmology exams for early detection of glaucoma and other disorders of the eye. Is the patient up to date with their annual eye exam?  Yes  Who is the provider or what is the name of the office in which the patient attends annual eye exams? Dr.Brasington If pt is not established with a provider, would they like to be referred to a provider to establish care? No .   Dental Screening: Recommended annual dental exams for proper oral hygiene  Community Resource Referral / Chronic Care Management: CRR required this visit?  No   CCM required this visit?  No      Plan:     I have personally reviewed and noted the following in the patients chart:   Medical and social history Use of alcohol, tobacco or illicit drugs  Current medications and supplements including opioid prescriptions.  Functional ability and status Nutritional status Physical activity Advanced directives List of other physicians Hospitalizations, surgeries, and ER visits in previous 12 months Vitals Screenings to include cognitive,  depression, and falls Referrals and appointments  In addition, I have reviewed and discussed with patient certain preventive protocols, quality metrics, and best practice recommendations. A written personalized care plan for preventive services  as well as general preventive health recommendations were provided to patient.     Dionisio David, LPN   [id number]   Nurse Notes: none

## 2021-10-19 DIAGNOSIS — R0902 Hypoxemia: Secondary | ICD-10-CM | POA: Diagnosis not present

## 2021-10-21 DIAGNOSIS — H353132 Nonexudative age-related macular degeneration, bilateral, intermediate dry stage: Secondary | ICD-10-CM | POA: Diagnosis not present

## 2021-10-21 DIAGNOSIS — H401131 Primary open-angle glaucoma, bilateral, mild stage: Secondary | ICD-10-CM | POA: Diagnosis not present

## 2021-10-23 ENCOUNTER — Other Ambulatory Visit: Payer: Self-pay

## 2021-10-23 ENCOUNTER — Ambulatory Visit (INDEPENDENT_AMBULATORY_CARE_PROVIDER_SITE_OTHER): Payer: Medicare Other

## 2021-10-23 DIAGNOSIS — Z5181 Encounter for therapeutic drug level monitoring: Secondary | ICD-10-CM | POA: Diagnosis not present

## 2021-10-23 DIAGNOSIS — I480 Paroxysmal atrial fibrillation: Secondary | ICD-10-CM | POA: Diagnosis not present

## 2021-10-23 DIAGNOSIS — I050 Rheumatic mitral stenosis: Secondary | ICD-10-CM

## 2021-10-23 LAB — POCT INR: INR: 2 (ref 2.0–3.0)

## 2021-10-23 NOTE — Patient Instructions (Signed)
-   don't have any greens in the next day or 2 ?- continue warfarin dosage of 1 tablet every day ?- Recheck INR in 5 weeks ?

## 2021-11-11 ENCOUNTER — Other Ambulatory Visit: Payer: Self-pay | Admitting: Family Medicine

## 2021-11-19 DIAGNOSIS — R0902 Hypoxemia: Secondary | ICD-10-CM | POA: Diagnosis not present

## 2021-11-27 ENCOUNTER — Ambulatory Visit (INDEPENDENT_AMBULATORY_CARE_PROVIDER_SITE_OTHER): Payer: Medicare Other

## 2021-11-27 DIAGNOSIS — Z5181 Encounter for therapeutic drug level monitoring: Secondary | ICD-10-CM

## 2021-11-27 DIAGNOSIS — I480 Paroxysmal atrial fibrillation: Secondary | ICD-10-CM | POA: Diagnosis not present

## 2021-11-27 DIAGNOSIS — I050 Rheumatic mitral stenosis: Secondary | ICD-10-CM

## 2021-11-27 LAB — POCT INR: INR: 2 (ref 2.0–3.0)

## 2021-11-27 NOTE — Patient Instructions (Signed)
-   don't have any greens in the next day or 2 ?- continue warfarin dosage of 1 tablet every day ?- Recheck INR in 6 weeks ?

## 2021-12-17 ENCOUNTER — Ambulatory Visit (INDEPENDENT_AMBULATORY_CARE_PROVIDER_SITE_OTHER): Payer: Medicare Other

## 2021-12-17 ENCOUNTER — Encounter (INDEPENDENT_AMBULATORY_CARE_PROVIDER_SITE_OTHER): Payer: Self-pay | Admitting: Vascular Surgery

## 2021-12-17 ENCOUNTER — Ambulatory Visit (INDEPENDENT_AMBULATORY_CARE_PROVIDER_SITE_OTHER): Payer: Medicare Other | Admitting: Vascular Surgery

## 2021-12-17 VITALS — BP 119/67 | HR 91 | Resp 17 | Ht 65.0 in

## 2021-12-17 DIAGNOSIS — I6523 Occlusion and stenosis of bilateral carotid arteries: Secondary | ICD-10-CM | POA: Diagnosis not present

## 2021-12-17 DIAGNOSIS — I10 Essential (primary) hypertension: Secondary | ICD-10-CM | POA: Diagnosis not present

## 2021-12-17 DIAGNOSIS — E782 Mixed hyperlipidemia: Secondary | ICD-10-CM | POA: Diagnosis not present

## 2021-12-17 DIAGNOSIS — I6521 Occlusion and stenosis of right carotid artery: Secondary | ICD-10-CM | POA: Diagnosis not present

## 2021-12-17 NOTE — Progress Notes (Signed)
? ? ?MRN : [id number] ? ?Molly Hodges is a 88 y.o. (14-Mar-1932) female who presents with chief complaint of No chief complaint on file. ?. ? ?History of Present Illness: Patient presents today for follow-up of her carotid disease.  She is doing well without any complaints or focal neurologic symptoms.  Duplex today shows no progression of disease with mildly elevated velocities in the right carotid stent and stable 40 to 59% stenosis in the left carotid artery. ? ?Current Outpatient Medications  ?Medication Sig Dispense Refill  ? amiodarone (PACERONE) 200 MG tablet Take 200 mg by mouth daily.    ? Calcium Carbonate-Vitamin D 600-400 MG-UNIT tablet Take 1 tablet by mouth daily.    ? clopidogrel (PLAVIX) 75 MG tablet Take 1 tablet (75 mg total) by mouth daily. 90 tablet 3  ? diltiazem (CARDIZEM CD) 180 MG 24 hr capsule TAKE 1 CAPSULE EVERY DAY 30 capsule 11  ? dorzolamide (TRUSOPT) 2 % ophthalmic solution Place 1 drop into both eyes 2 (two) times daily.     ? ezetimibe (ZETIA) 10 MG tablet TAKE 1 TABLET BY MOUTH DAILY. 90 tablet 3  ? furosemide (LASIX) 40 MG tablet Take 1 tablet (40 mg total) by mouth daily. Take extra tab in the evening (2 pm) for swelling or shortness of breath) 200 tablet 3  ? hydrochlorothiazide (MICROZIDE) 12.5 MG capsule Take 12.5 mg by mouth daily.    ? ipratropium-albuterol (DUONEB) 0.5-2.5 (3) MG/3ML SOLN TAKE 3MLS BY NEBULIZATION EVERY 4 HOURS AS NEEDED 360 mL 1  ? isosorbide mononitrate (IMDUR) 30 MG 24 hr tablet Take 1 tablet (30 mg total) by mouth every evening. 90 tablet 3  ? latanoprost (XALATAN) 0.005 % ophthalmic solution Place 1 drop into both eyes at bedtime.     ? levalbuterol (XOPENEX HFA) 45 MCG/ACT inhaler Inhale 2 puffs into the lungs every 6 (six) hours as needed for wheezing. 1 each 3  ? Multiple Vitamins-Minerals (PRESERVISION AREDS 2+MULTI VIT) CAPS Take 2 capsules by mouth daily.    ? nitroGLYCERIN (NITROSTAT) 0.4 MG SL tablet Place 1 tablet (0.4 mg total) under the  tongue every 5 (five) minutes as needed (for chest pain or shortness of breath). 100 tablet 3  ? potassium chloride (KLOR-CON) 10 MEQ tablet Take 2 tablets (20 mEq total) by mouth daily. Take an extra 20 mEq when taking extra lasix 200 tablet 3  ? pregabalin (LYRICA) 50 MG capsule TAKE 1 CAPSULE BY MOUTH 2 TIMES DAILY 60 capsule 5  ? rosuvastatin (CRESTOR) 20 MG tablet TAKE 1 TABLET BY MOUTH DAILY. 90 tablet 3  ? vitamin C (ASCORBIC ACID) 500 MG tablet Take 500 mg by mouth daily.    ? vitamin E 100 UNIT capsule Take 200 Units by mouth daily.    ? warfarin (COUMADIN) 2.5 MG tablet Take 1 tablet (2.5 mg total) by mouth daily. Take as directed by the anti-coag clinic 30 tablet 1  ? cholecalciferol (VITAMIN D) 1000 UNITS tablet Take 1,000 Units by mouth daily. (Patient not taking: Reported on 10/15/2021)    ? enoxaparin (LOVENOX) 60 MG/0.6ML injection SMARTSIG:0.6 Milliliter(s) SUB-Q Every 12 Hours (Patient not taking: Reported on 10/15/2021)    ? magnesium oxide (MAG-OX) 400 MG tablet Take 400 mg by mouth daily. (Patient not taking: Reported on 12/17/2021)    ? ?No current facility-administered medications for this visit.  ? ? ?Past Medical History:  ?Diagnosis Date  ? Aortic atherosclerosis ([place])   ? a. 05/2021 TEE: GrIII atheroma plaque involving  the asc, transverse, and desc Ao.  ? Asthma   ? CAD (coronary artery disease)   ? a. 04/2021 Cath: LM nl, LAD 85p/m, D1 80, RI nl, LCX nl, RCA nl; b. 07/2021 PCI: pLAD (2.75x26 Onyx Frontier DES), D1 (2.5x22 Onyx Frontier DES).  ? Carotid artery disease ([place])   ? a. s/p R carotid stenting (20m x 760mx 4cm long Exact stent); b. 06/2021 U/S: RICA 4028-78%LICA 4067-67% ? Community acquired pneumonia   ? Essential hypertension   ? Glaucoma   ? Hyperlipidemia   ? Mitral regurgitation   ? a. TTE 08/2015: EF 60-65%, normal wall motion, mild MR, mildly dilated left atrium measuring 40 mm, RVSF normal, PASP normal; b. 05/2021 TEE: Moderate MR.  ? Mitral stenosis   ? a. 05/2021 L/RHC: Sev  MS w/ mean grad 13-1756m and MV area of 0.5-06.cm^2; b. 05/2021 TEE: EF 55-60%, no rwma, nl RV fxn, mod MR, mod MS (MV area by P1/2t: 1.61 cm^2 w/ mean grad of 9mm34m.  ? Peripheral neuropathy   ? Persistent atrial fibrillation (HCC)[place]/30/2017  ? a. s/p DCCV 11/15/2015; b. CHADS2VASc => 4 (HTN, age x 2, female)--> warfarin; c. 06/2021 recurrent afib-->amio added.  ? ? ?Past Surgical History:  ?Procedure Laterality Date  ? ABDOMINAL HYSTERECTOMY  1987  ? due to heavy bleeding  ? APPENDECTOMY    ? CARDIAC CATHETERIZATION    ? CAROTID PTA/STENT INTERVENTION Right 08/15/2020  ? Procedure: CAROTID PTA/STENT INTERVENTION;  Surgeon: Anjanette Gilkey,Algernon Huxley;  [facility];  Service: Cardiovascular;  Laterality: Right;  ? CATARACT EXTRACTION    ? CORONARY STENT INTERVENTION N/A 08/02/2021  ? Procedure: CORONARY STENT INTERVENTION;  Surgeon: End,Nelva Bush;  [facility];  Service: Cardiovascular;  Laterality: N/A;  ? ELECTROPHYSIOLOGIC STUDY N/A 11/15/2015  ? Procedure: CARDIOVERSION;  Surgeon: TimoMinna Merritts;  Location: [facility];  Service: Cardiovascular;  Laterality: N/A;  ? INTRAVASCULAR IMAGING/OCT N/A 08/02/2021  ? Procedure: INTRAVASCULAR IMAGING/OCT;  Surgeon: End,Nelva Bush;  [facility];  Service: Cardiovascular;  Laterality: N/A;  ? LEFT HEART CATH AND CORONARY ANGIOGRAPHY N/A 08/02/2021  ? Procedure: LEFT HEART CATH AND CORONARY ANGIOGRAPHY;  Surgeon: End,Nelva Bush;  [facility];  Service: Cardiovascular;  Laterality: N/A;  ? LEFT HEART CATH AND CORONARY ANGIOGRAPHY N/A 08/07/2021  ? Procedure: LEFT HEART CATH AND CORONARY ANGIOGRAPHY;  Surgeon: AridWellington Hampshire;  [facility];  Service: Cardiovascular;  Laterality: N/A;  ? RIGHT/LEFT HEART CATH AND CORONARY ANGIOGRAPHY N/A 05/17/2021  ? Procedure: RIGHT/LEFT HEART CATH AND CORONARY ANGIOGRAPHY;  Surgeon: End,Nelva Bush;  [facility];  Service: Cardiovascular;  Laterality: N/A;  ? TEE WITHOUT CARDIOVERSION N/A 06/13/2021  ? Procedure: TRANSESOPHAGEAL ECHOCARDIOGRAM (TEE);  Surgeon: GollMinna Merritts;  Location: [facility];  Service: Cardiovascular;  Laterality: N/A;  ? ? ? ?Social History  ? ?Tobacco Use  ? Smoking status: Never  ? Smokeless tobacco: Never  ? Tobacco comments:  ?  never   ?Vaping Use  ? Vaping Use: Never used  ?Substance Use Topics  ? Alcohol use: No  ? Drug use: Never  ? ?  ? ? ?Family History  ?Problem Relation Age of Onset  ? CAD Mother   ? CAD Father   ? Cancer Son 63  75    lung cancer  ? ? ? ?Allergies  ?Allergen Reactions  ?  Shellfish Allergy Anaphylaxis  ?  Other reaction(s): Hallucination  ? Azithromycin Other (See Comments)  ?  Extreme burning sensation at IV site ?  ? Tamiflu [Oseltamivir Phosphate] Other (See Comments)  ?  Reaction:  Hallucinations  ?  ? Albuterol Palpitations  ?  Heart racing.   ? ? ? ?REVIEW OF SYSTEMS (Negative unless checked) ? ?Constitutional: '[]'$ Weight loss  '[]'$ Fever  '[]'$ Chills ?Cardiac: '[]'$ Chest pain   '[]'$ Chest pressure   '[]'$ Palpitations   '[]'$ Shortness of breath when laying flat   '[]'$ Shortness of breath at rest   '[]'$ Shortness of breath with exertion. ?Vascular:  '[]'$ Pain in legs with walking   '[]'$ Pain in legs at rest   '[]'$ Pain in legs when laying flat   '[]'$ Claudication   '[]'$ Pain in feet when walking  '[]'$ Pain in feet at rest  '[]'$ Pain in feet when laying flat   '[]'$ History of DVT   '[]'$ Phlebitis   '[]'$ Swelling in legs   '[]'$ Varicose veins   '[]'$ Non-healing ulcers ?Pulmonary:   '[]'$ Uses home oxygen   '[]'$ Productive cough   '[]'$ Hemoptysis   '[]'$ Wheeze  '[]'$ COPD   '[]'$ Asthma ?Neurologic:  '[x]'$ Dizziness  '[]'$ Blackouts   '[]'$ Seizures   '[]'$ History of stroke   '[]'$ History of TIA  '[]'$ Aphasia   '[]'$ Temporary blindness   '[]'$ Dysphagia   '[]'$ Weakness or numbness in arms   '[]'$ Weakness or numbness in legs ?Musculoskeletal:  '[x]'$ Arthritis   '[]'$ Joint swelling   '[x]'$ Joint pain   '[]'$ Low back pain ?Hematologic:  '[]'$ Easy bruising  '[]'$ Easy bleeding   '[]'$ Hypercoagulable  state   '[]'$ Anemic  '[]'$ Hepatitis ?Gastrointestinal:  '[]'$ Blood in stool   '[]'$ Vomiting blood  '[]'$ Gastroesophageal reflux/heartburn   '[]'$ Difficulty swallowing. ?Genitourinary:  '[]'$ Chronic kidney disease   '[]'$ Difficul

## 2021-12-18 ENCOUNTER — Ambulatory Visit: Payer: Medicare Other | Admitting: Dermatology

## 2021-12-18 DIAGNOSIS — L578 Other skin changes due to chronic exposure to nonionizing radiation: Secondary | ICD-10-CM

## 2021-12-18 DIAGNOSIS — C4492 Squamous cell carcinoma of skin, unspecified: Secondary | ICD-10-CM

## 2021-12-18 DIAGNOSIS — L820 Inflamed seborrheic keratosis: Secondary | ICD-10-CM

## 2021-12-18 DIAGNOSIS — D485 Neoplasm of uncertain behavior of skin: Secondary | ICD-10-CM

## 2021-12-18 DIAGNOSIS — C44622 Squamous cell carcinoma of skin of right upper limb, including shoulder: Secondary | ICD-10-CM | POA: Diagnosis not present

## 2021-12-18 DIAGNOSIS — L821 Other seborrheic keratosis: Secondary | ICD-10-CM | POA: Diagnosis not present

## 2021-12-18 HISTORY — DX: Squamous cell carcinoma of skin, unspecified: C44.92

## 2021-12-18 NOTE — Assessment & Plan Note (Signed)
Duplex today shows no progression of disease with mildly elevated velocities in the right carotid stent and stable 40 to 59% stenosis in the left carotid artery.  Continue current medical regimen.  Recheck in 1 year ?

## 2021-12-18 NOTE — Progress Notes (Signed)
? ?New Patient Visit ? ?Subjective  ?Molly Hodges is a 88 y.o. female who presents for the following: Other (Spot of right hand x months that is painful. Also has another spot on her right hand, left forearm and left knee). ?The patient has spots, moles and lesions to be evaluated, some may be new or changing and the patient has concerns that these could be cancer. ? ?Accompanied by caregiver ? ?The following portions of the chart were reviewed this encounter and updated as appropriate:  ? Tobacco  Allergies  Meds  Problems  Med Hx  Surg Hx  Fam Hx   ?  ?Review of Systems:  No other skin or systemic complaints except as noted in HPI or Assessment and Plan. ? ?Objective  ?Well appearing patient in no apparent distress; mood and affect are within normal limits. ? ?A focused examination was performed including arms, hands, left leg. Relevant physical exam findings are noted in the Assessment and Plan. ? ?Right dorsum hand ?Hyperkeratotic papule 1.1 cm ? ?Right dorsum hand x 1, left forearm x 1, left knee x 1 (3) ?Erythematous stuck-on, waxy papule or plaque ? ? ?Assessment & Plan  ?Neoplasm of uncertain behavior of skin ?Right dorsum hand ? ?Epidermal / dermal shaving ? ?Lesion diameter (cm):  1.1 ?Informed consent: discussed and consent obtained   ?Timeout: patient name, date of birth, surgical site, and procedure verified   ?Procedure prep:  Patient was prepped and draped in usual sterile fashion ?Prep type:  Isopropyl alcohol ?Anesthesia: the lesion was anesthetized in a standard fashion   ?Anesthetic:  1% lidocaine w/ epinephrine 1-100,000 buffered w/ 8.4% NaHCO3 ?Instrument used: flexible razor blade   ?Hemostasis achieved with: pressure, aluminum chloride and electrodesiccation   ?Outcome: patient tolerated procedure well   ?Post-procedure details: sterile dressing applied and wound care instructions given   ?Dressing type: bandage and petrolatum   ? ?Destruction of lesion ?Complexity: extensive    ?Destruction method: electrodesiccation and curettage   ?Informed consent: discussed and consent obtained   ?Timeout:  patient name, date of birth, surgical site, and procedure verified ?Procedure prep:  Patient was prepped and draped in usual sterile fashion ?Prep type:  Isopropyl alcohol ?Anesthesia: the lesion was anesthetized in a standard fashion   ?Anesthetic:  1% lidocaine w/ epinephrine 1-100,000 buffered w/ 8.4% NaHCO3 ?Curettage performed in three different directions: Yes   ?Electrodesiccation performed over the curetted area: Yes   ?Lesion length (cm):  1.1 ?Lesion width (cm):  1.1 ?Margin per side (cm):  0.2 ?Final wound size (cm):  1.5 ?Hemostasis achieved with:  pressure and aluminum chloride ?Outcome: patient tolerated procedure well with no complications   ?Post-procedure details: sterile dressing applied and wound care instructions given   ?Dressing type: bandage and petrolatum   ? ?Specimen 1 - Surgical pathology ?Differential Diagnosis: SCC vs other  ?Check Margins: No ?EDC today ? ?Inflamed seborrheic keratosis (3) ?Right dorsum hand x 1, left forearm x 1, left knee x 1 ? ?Destruction of lesion - Right dorsum hand x 1, left forearm x 1, left knee x 1 ?Complexity: simple   ?Destruction method: cryotherapy   ?Informed consent: discussed and consent obtained   ?Timeout:  patient name, date of birth, surgical site, and procedure verified ?Lesion destroyed using liquid nitrogen: Yes   ?Region frozen until ice ball extended beyond lesion: Yes   ?Outcome: patient tolerated procedure well with no complications   ?Post-procedure details: wound care instructions given   ? ?Actinic Damage ?-  chronic, secondary to cumulative UV radiation exposure/sun exposure over time ?- diffuse scaly erythematous macules with underlying dyspigmentation ?- Recommend daily broad spectrum sunscreen SPF 30+ to sun-exposed areas, reapply every 2 hours as needed.  ?- Recommend staying in the shade or wearing long sleeves, sun  glasses (UVA+UVB protection) and wide brim hats (4-inch brim around the entire circumference of the hat). ?- Call for new or changing lesions. ? ?Seborrheic Keratoses ?- Stuck-on, waxy, tan-brown papules and/or plaques  ?- Benign-appearing ?- Discussed benign etiology and prognosis. ?- Observe ?- Call for any changes ? ?Return in about 3 months (around 03/20/2022) for ISK follow up, Biopsy Follow up. ? ?I, Ashok Cordia, CMA, am acting as scribe for [name], MD . ?Documentation: I have reviewed the above documentation for accuracy and completeness, and I agree with the above. ? ?[name], MD ? ?

## 2021-12-18 NOTE — Patient Instructions (Signed)
Cryotherapy Aftercare  Wash gently with soap and water everyday.   Apply Vaseline and Band-Aid daily until healed.    Wound Care Instructions  Cleanse wound gently with soap and water once a day then pat dry with clean gauze. Apply a thing coat of Petrolatum (petroleum jelly, "Vaseline") over the wound (unless you have an allergy to this). We recommend that you use a new, sterile tube of Vaseline. Do not pick or remove scabs. Do not remove the yellow or white "healing tissue" from the base of the wound.  Cover the wound with fresh, clean, nonstick gauze and secure with paper tape. You may use Band-Aids in place of gauze and tape if the would is small enough, but would recommend trimming much of the tape off as there is often too much. Sometimes Band-Aids can irritate the skin.  You should call the office for your biopsy report after 1 week if you have not already been contacted.  If you experience any problems, such as abnormal amounts of bleeding, swelling, significant bruising, significant pain, or evidence of infection, please call the office immediately.  FOR ADULT SURGERY PATIENTS: If you need something for pain relief you may take 1 extra strength Tylenol (acetaminophen) AND 2 Ibuprofen (200mg each) together every 4 hours as needed for pain. (do not take these if you are allergic to them or if you have a reason you should not take them.) Typically, you may only need pain medication for 1 to 3 days.        If You Need Anything After Your Visit  If you have any questions or concerns for your doctor, please call our main line at [phone] and press option 4 to reach your doctor's medical assistant. If no one answers, please leave a voicemail as directed and we will return your call as soon as possible. Messages left after 4 pm will be answered the following business day.   You may also send us a message via MyChart. We typically respond to MyChart messages within 1-2 business  days.  For prescription refills, please ask your pharmacy to contact our office. Our fax number is [phone].  If you have an urgent issue when the clinic is closed that cannot wait until the next business day, you can page your doctor at the number below.    Please note that while we do our best to be available for urgent issues outside of office hours, we are not available 24/7.   If you have an urgent issue and are unable to reach us, you may choose to seek medical care at your doctor's office, retail clinic, urgent care center, or emergency room.  If you have a medical emergency, please immediately call 911 or go to the emergency department.  Pager Numbers  - Dr. Kowalski: [phone]  - Dr. Moye: [phone]  - Dr. Stewart: [phone]  In the event of inclement weather, please call our main line at [phone] for an update on the status of any delays or closures.  Dermatology Medication Tips: Please keep the boxes that topical medications come in in order to help keep track of the instructions about where and how to use these. Pharmacies typically print the medication instructions only on the boxes and not directly on the medication tubes.   If your medication is too expensive, please contact our office at [phone] option 4 or send us a message through MyChart.   We are unable to tell what your co-pay for medications will be   in advance as this is different depending on your insurance coverage. However, we may be able to find a substitute medication at lower cost or fill out paperwork to get insurance to cover a needed medication.   If a prior authorization is required to get your medication covered by your insurance company, please allow us 1-2 business days to complete this process.  Drug prices often vary depending on where the prescription is filled and some pharmacies may offer cheaper prices.  The website www.goodrx.com contains coupons for medications through  different pharmacies. The prices here do not account for what the cost may be with help from insurance (it may be cheaper with your insurance), but the website can give you the price if you did not use any insurance.  - You can print the associated coupon and take it with your prescription to the pharmacy.  - You may also stop by our office during regular business hours and pick up a GoodRx coupon card.  - If you need your prescription sent electronically to a different pharmacy, notify our office through [facility] MyChart or by phone at [phone] option 4.     Si Usted Necesita Algo Despus de Su Visita  Tambin puede enviarnos un mensaje a travs de MyChart. Por lo general respondemos a los mensajes de MyChart en el transcurso de 1 a 2 das hbiles.  Para renovar recetas, por favor pida a su farmacia que se ponga en contacto con nuestra oficina. Nuestro nmero de fax es el [phone].  Si tiene un asunto urgente cuando la clnica est cerrada y que no puede esperar hasta el siguiente da hbil, puede llamar/localizar a su doctor(a) al nmero que aparece a continuacin.   Por favor, tenga en cuenta que aunque hacemos todo lo posible para estar disponibles para asuntos urgentes fuera del horario de oficina, no estamos disponibles las 24 horas del da, los 7 das de la semana.   Si tiene un problema urgente y no puede comunicarse con nosotros, puede optar por buscar atencin mdica  en el consultorio de su doctor(a), en una clnica privada, en un centro de atencin urgente o en una sala de emergencias.  Si tiene una emergencia mdica, por favor llame inmediatamente al 911 o vaya a la sala de emergencias.  Nmeros de bper  - Dr. Kowalski: [phone]  - Dra. Moye: [phone]  - Dra. Stewart: [phone]  En caso de inclemencias del tiempo, por favor llame a nuestra lnea principal al [phone] para una actualizacin sobre el estado de cualquier retraso o cierre.  Consejos  para la medicacin en dermatologa: Por favor, guarde las cajas en las que vienen los medicamentos de uso tpico para ayudarle a seguir las instrucciones sobre dnde y cmo usarlos. Las farmacias generalmente imprimen las instrucciones del medicamento slo en las cajas y no directamente en los tubos del medicamento.   Si su medicamento es muy caro, por favor, pngase en contacto con nuestra oficina llamando al [phone] y presione la opcin 4 o envenos un mensaje a travs de MyChart.   No podemos decirle cul ser su copago por los medicamentos por adelantado ya que esto es diferente dependiendo de la cobertura de su seguro. Sin embargo, es posible que podamos encontrar un medicamento sustituto a menor costo o llenar un formulario para que el seguro cubra el medicamento que se considera necesario.   Si se requiere una autorizacin previa para que su compaa de seguros cubra su medicamento, por favor permtanos de 1 a   2 das hbiles para completar este proceso.  Los precios de los medicamentos varan con frecuencia dependiendo del lugar de dnde se surte la receta y alguna farmacias pueden ofrecer precios ms baratos.  El sitio web www.goodrx.com tiene cupones para medicamentos de diferentes farmacias. Los precios aqu no tienen en cuenta lo que podra costar con la ayuda del seguro (puede ser ms barato con su seguro), pero el sitio web puede darle el precio si no utiliz ningn seguro.  - Puede imprimir el cupn correspondiente y llevarlo con su receta a la farmacia.  - Tambin puede pasar por nuestra oficina durante el horario de atencin regular y recoger una tarjeta de cupones de GoodRx.  - Si necesita que su receta se enve electrnicamente a una farmacia diferente, informe a nuestra oficina a travs de MyChart de [place] o por telfono llamando al [phone] y presione la opcin 4.  

## 2021-12-23 ENCOUNTER — Telehealth: Payer: Self-pay

## 2021-12-23 NOTE — Telephone Encounter (Signed)
-----   Message from Ralene Bathe, MD sent at 12/19/2021  5:21 PM EDT ----- ?Diagnosis ?Skin (M), right dorsum hand ?SQUAMOUS CELL CARCINOMA, KERATOACANTHOMA TYPE ? ?Cancer - SCC ?Already treated  ?Recheck next visit ? ?

## 2021-12-23 NOTE — Telephone Encounter (Signed)
Tried calling pt about bx results but no answer and no voicemail./sh ?

## 2021-12-26 ENCOUNTER — Telehealth: Payer: Self-pay

## 2021-12-26 NOTE — Telephone Encounter (Signed)
-----   Message from Ralene Bathe, MD sent at 12/19/2021  5:21 PM EDT ----- ?Diagnosis ?Skin (M), right dorsum hand ?SQUAMOUS CELL CARCINOMA, KERATOACANTHOMA TYPE ? ?Cancer - SCC ?Already treated  ?Recheck next visit ? ?

## 2021-12-26 NOTE — Telephone Encounter (Signed)
Patient informed of pathology results 

## 2021-12-30 ENCOUNTER — Ambulatory Visit: Payer: Medicare Other | Admitting: Dermatology

## 2021-12-30 ENCOUNTER — Encounter: Payer: Self-pay | Admitting: Dermatology

## 2021-12-30 DIAGNOSIS — Z85828 Personal history of other malignant neoplasm of skin: Secondary | ICD-10-CM | POA: Diagnosis not present

## 2021-12-30 DIAGNOSIS — L578 Other skin changes due to chronic exposure to nonionizing radiation: Secondary | ICD-10-CM | POA: Diagnosis not present

## 2021-12-30 DIAGNOSIS — T8149XA Infection following a procedure, other surgical site, initial encounter: Secondary | ICD-10-CM

## 2021-12-30 DIAGNOSIS — D492 Neoplasm of unspecified behavior of bone, soft tissue, and skin: Secondary | ICD-10-CM

## 2021-12-30 NOTE — Progress Notes (Signed)
   Follow-Up Visit   Subjective  Molly Hodges is a 88 y.o. female who presents for the following: Wound Check (Right dorsal hand. Bx proven SCC, treated with [facility] 12/18/2021. Lesion on left forearm is raised up and worsening since being treated with LN2 on 12/18/2021). The patient has spots, moles and lesions to be evaluated, some may be new or changing and the patient has concerns that these could be cancer.  Daughter with patient who contributes to history.   The following portions of the chart were reviewed this encounter and updated as appropriate:  Tobacco  Allergies  Meds  Problems  Med Hx  Surg Hx  Fam Hx     Review of Systems: No other skin or systemic complaints except as noted in HPI or Assessment and Plan.  Objective  Well appearing patient in no apparent distress; mood and affect are within normal limits.  A focused examination was performed including face, right hand. Relevant physical exam findings are noted in the Assessment and Plan.  Left Forearm - Posterior Hyperkeratotic plaque     Right Hand - Posterior Induration and erythema around surgical wound.      Assessment & Plan  Neoplasm of skin Left Forearm - Posterior R/O SCC Tx with LN2 12/18/2021 Plan Bx and [facility] 12/31/2021  Wound cellulitis after surgery Right Hand - Posterior Hx of SCC, R/O recurrence. Hx of shave removal and EDC performed 12/18/2021. Plan excision versus biopsy 12/31/2021.  Actinic Damage - chronic, secondary to cumulative UV radiation exposure/sun exposure over time - diffuse scaly erythematous macules with underlying dyspigmentation - Recommend daily broad spectrum sunscreen SPF 30+ to sun-exposed areas, reapply every 2 hours as needed.  - Recommend staying in the shade or wearing long sleeves, sun glasses (UVA+UVB protection) and wide brim hats (4-inch brim around the entire circumference of the hat). - Call for new or changing lesions.  Return in about 1 day (around  12/31/2021).  I, Molly Hodges, CMA, am acting as scribe for [name], MD. Documentation: I have reviewed the above documentation for accuracy and completeness, and I agree with the above.  [name], MD

## 2021-12-30 NOTE — Patient Instructions (Signed)

## 2021-12-31 ENCOUNTER — Encounter: Payer: Self-pay | Admitting: Dermatology

## 2021-12-31 ENCOUNTER — Ambulatory Visit (INDEPENDENT_AMBULATORY_CARE_PROVIDER_SITE_OTHER): Payer: Medicare Other | Admitting: Dermatology

## 2021-12-31 DIAGNOSIS — D485 Neoplasm of uncertain behavior of skin: Secondary | ICD-10-CM

## 2021-12-31 DIAGNOSIS — C44629 Squamous cell carcinoma of skin of left upper limb, including shoulder: Secondary | ICD-10-CM | POA: Diagnosis not present

## 2021-12-31 DIAGNOSIS — C44622 Squamous cell carcinoma of skin of right upper limb, including shoulder: Secondary | ICD-10-CM | POA: Diagnosis not present

## 2021-12-31 NOTE — Patient Instructions (Signed)

## 2021-12-31 NOTE — Progress Notes (Signed)
Cardiology Office Note  Date:  01/03/2022   ID:  Molly Hodges 1932-02-07, MRN [id number]  PCP:  Birdie Sons, MD   Chief Complaint  Patient presents with   3 month follow up     "Doing well." Medications reviewed by the patient verbally.     HPI:  Molly Hodges is a 88 y.o. female with  Paroxysmal Afib,  (CHADS2VASc of 4 - HTN, age x 2, female)  COPD, (never smoked) asthma, prior episodes of PNA,   hospital admission from 1/30 to 09/21/15,  HTN  Mitral valve stenosis, moderate December 2021 Pericardial effusion mild to moderate December 2021 PAD, carotid stenosis, high-grade on the right, stent placed 2021 Chronic dizziness 05/2020 started, last couple of weeks at a time, seen by ENT/neurology who presents  Follow-up for her atrial fibrillation,Previous cardioversion, carotid stenosis, CAD with PCI  Last seen by myself in clinic  2/23 Normal sinus rhythm October 2022 atrial fibrillation November 2022 Has remained in atrial fibrillation since that time Bronchitis/pneumonia January 2023 requiring oxygen  On today's visit, feels well, not on oxygen "They won't pick it up", followed by pulmonary Sedentary, active in house Can walk ok "Tumbled down stairs" recently, neuopathy, no major injury Now has a ramp outside her house to avoid stairs  Long discussion concerning her atrial fibrillation, rate well controlled No sx from atrial fib, no edema Remains on Lasix 40 daily, HCTZ 12.5 Still on amiodarone No dizzy spells, no near-syncope  EKG personally reviewed by myself on todays visit Atrial fibrillation rate 71 bpm no ST or T changes  Other past medical hx reviewed Cath 08/02/21  Severe single-vessel coronary artery disease with 80-90% ostial through mid LAD and proximal/mid D1 stenoses, as seen on recent diagnostic catheterization. Successful OCT-guided PCI to ostial through mid LAD using Onyx Frontier 2.75 x 26 mm drug-eluting stent with 0% residual  stenosis and TIMI-3 flow. Successful PCI to D1 using Onyx Frontier 2.5 x 22 mm drug-eluting stent with 0% residual stenosis and TIMI-3 flow.  Cath 08/07/2021 for chest pain/bronchitis   1st Diag-1 lesion is 20% stenosed.   Non-stenotic Ost LAD to Mid LAD lesion was previously treated.   Non-stenotic 1st Diag-2 lesion was previously treated.   1.  Widely patent LAD/diagonal stents with no significant restenosis.    In the ER 08/23/21 for chest pain, enz neg Treated for bronchitis  Echocardiogram July 25, 2020  EF 55 to 60%, mildly dilated left atrium, mild to moderate circumferential pericardial effusion, Mild to moderate mitral valve regurgitation moderate mitral valve stenosis gradient 10 mmHg Dilated IVC  Seen by Dr. Lucky Cowboy, episodes of dizziness High-grade right carotid artery stenosis estimated 80 to 99%  found to be in new onset Afib with RVR on 09/17/15 upon her arrival to [facility], pneumonia started on Eliquis given her stroke risk (CHADS2VASc of 4 - HTN, age x 2, female) and rate controlled.  Beta blockers were avoided given her asthma.  Started on thousand channel blocker    PMH:   has a past medical history of Aortic atherosclerosis ([place]), Asthma, CAD (coronary artery disease), Carotid artery disease ([place]), Community acquired pneumonia, Essential hypertension, Glaucoma, Hyperlipidemia, Mitral regurgitation, Mitral stenosis, Peripheral neuropathy, Persistent atrial fibrillation ([place]) (09/17/2015), and Squamous cell carcinoma of skin (12/18/2021).  PSH:    Past Surgical History:  Procedure Laterality Date   ABDOMINAL HYSTERECTOMY  1987   due to heavy bleeding   APPENDECTOMY     CARDIAC CATHETERIZATION  CAROTID PTA/STENT INTERVENTION Right 08/15/2020   Procedure: CAROTID PTA/STENT INTERVENTION;  Surgeon: Algernon Huxley, MD;  Location: [place] [facility];  Service: Cardiovascular;  Laterality: Right;   CATARACT EXTRACTION     CORONARY STENT INTERVENTION N/A 08/02/2021    Procedure: CORONARY STENT INTERVENTION;  Surgeon: Nelva Bush, MD;  Location: [place] [facility];  Service: Cardiovascular;  Laterality: N/A;   ELECTROPHYSIOLOGIC STUDY N/A 11/15/2015   Procedure: CARDIOVERSION;  Surgeon: Minna Merritts, MD;  Location: [facility];  Service: Cardiovascular;  Laterality: N/A;   INTRAVASCULAR IMAGING/OCT N/A 08/02/2021   Procedure: INTRAVASCULAR IMAGING/OCT;  Surgeon: Nelva Bush, MD;  Location: [place] [facility];  Service: Cardiovascular;  Laterality: N/A;   LEFT HEART CATH AND CORONARY ANGIOGRAPHY N/A 08/02/2021   Procedure: LEFT HEART CATH AND CORONARY ANGIOGRAPHY;  Surgeon: Nelva Bush, MD;  Location: [place] [facility];  Service: Cardiovascular;  Laterality: N/A;   LEFT HEART CATH AND CORONARY ANGIOGRAPHY N/A 08/07/2021   Procedure: LEFT HEART CATH AND CORONARY ANGIOGRAPHY;  Surgeon: Wellington Hampshire, MD;  Location: [place] [facility];  Service: Cardiovascular;  Laterality: N/A;   RIGHT/LEFT HEART CATH AND CORONARY ANGIOGRAPHY N/A 05/17/2021   Procedure: RIGHT/LEFT HEART CATH AND CORONARY ANGIOGRAPHY;  Surgeon: Nelva Bush, MD;  Location: [place] [facility];  Service: Cardiovascular;  Laterality: N/A;   TEE WITHOUT CARDIOVERSION N/A 06/13/2021   Procedure: TRANSESOPHAGEAL ECHOCARDIOGRAM (TEE);  Surgeon: Minna Merritts, MD;  Location: [facility];  Service: Cardiovascular;  Laterality: N/A;    Current Outpatient Medications  Medication Sig Dispense Refill   amiodarone (PACERONE) 200 MG tablet Take 200 mg by mouth daily.     Calcium Carbonate-Vitamin D 600-400 MG-UNIT tablet Take 1 tablet by mouth daily.     clopidogrel (PLAVIX) 75 MG tablet Take 1 tablet (75 mg total) by mouth daily. 90 tablet 3   diltiazem (CARDIZEM CD) 180 MG 24 hr capsule TAKE 1 CAPSULE EVERY DAY 30 capsule 11   dorzolamide (TRUSOPT) 2 % ophthalmic solution Place 1 drop into both eyes 2 (two) times daily.      ezetimibe (ZETIA) 10 MG tablet TAKE 1 TABLET BY MOUTH  DAILY. 90 tablet 3   furosemide (LASIX) 40 MG tablet Take 1 tablet (40 mg total) by mouth daily. Take extra tab in the evening (2 pm) for swelling or shortness of breath) 200 tablet 3   hydrochlorothiazide (MICROZIDE) 12.5 MG capsule Take 12.5 mg by mouth daily.     ipratropium-albuterol (DUONEB) 0.5-2.5 (3) MG/3ML SOLN TAKE 3MLS BY NEBULIZATION EVERY 4 HOURS AS NEEDED 360 mL 1   isosorbide mononitrate (IMDUR) 30 MG 24 hr tablet Take 1 tablet (30 mg total) by mouth every evening. 90 tablet 3   latanoprost (XALATAN) 0.005 % ophthalmic solution Place 1 drop into both eyes at bedtime.      levalbuterol (XOPENEX HFA) 45 MCG/ACT inhaler Inhale 2 puffs into the lungs every 6 (six) hours as needed for wheezing. 1 each 3   magnesium oxide (MAG-OX) 400 MG tablet Take 400 mg by mouth daily.     Multiple Vitamins-Minerals (PRESERVISION AREDS 2+MULTI VIT) CAPS Take 2 capsules by mouth daily.     nitroGLYCERIN (NITROSTAT) 0.4 MG SL tablet Place 1 tablet (0.4 mg total) under the tongue every 5 (five) minutes as needed (for chest pain or shortness of breath). 100 tablet 3   potassium chloride (KLOR-CON) 10 MEQ tablet Take 2 tablets (20 mEq total) by mouth daily. Take an extra 20 mEq when taking  extra lasix 200 tablet 3   pregabalin (LYRICA) 50 MG capsule TAKE 1 CAPSULE BY MOUTH 2 TIMES DAILY 60 capsule 5   rosuvastatin (CRESTOR) 20 MG tablet TAKE 1 TABLET BY MOUTH DAILY. 90 tablet 3   vitamin C (ASCORBIC ACID) 500 MG tablet Take 500 mg by mouth daily.     vitamin E 100 UNIT capsule Take 200 Units by mouth daily.     warfarin (COUMADIN) 2.5 MG tablet Take 1 tablet (2.5 mg total) by mouth daily. Take as directed by the anti-coag clinic 30 tablet 1   cholecalciferol (VITAMIN D) 1000 UNITS tablet Take 1,000 Units by mouth daily. (Patient not taking: Reported on 10/15/2021)     No current facility-administered medications for this visit.    Allergies:   Shellfish allergy, Azithromycin, Tamiflu [oseltamivir  phosphate], and Albuterol   Social History:  The patient  reports that she has never smoked. She has never used smokeless tobacco. She reports that she does not drink alcohol and does not use drugs.   Family History:   family history includes CAD in her father and mother; Cancer (age of onset: 14) in her son.    Review of Systems: Review of Systems  Constitutional: Negative.   Respiratory: Negative.    Cardiovascular: Negative.   Gastrointestinal: Negative.   Musculoskeletal: Negative.   Neurological: Negative.   Psychiatric/Behavioral: Negative.    All other systems reviewed and are negative.  PHYSICAL EXAM: VS:  BP (!) 140/56 (BP Location: Left Arm, Patient Position: Sitting, Cuff Size: Normal)   Pulse 71   Ht '5\' 5"'$  (1.651 m)   Wt 126 lb 4 oz (57.3 kg)   SpO2 97%   BMI 21.01 kg/m  , BMI Body mass index is 21.01 kg/m. Constitutional:  oriented to person, place, and time. No distress.  HENT:  Head: Grossly normal Eyes:  no discharge. No scleral icterus.  Neck: No JVD, no carotid bruits  Cardiovascular: Irregularly irregular, no murmurs appreciated Pulmonary/Chest: Clear to auscultation bilaterally, no wheezes or rails Abdominal: Soft.  no distension.  no tenderness.  Musculoskeletal: Normal range of motion Neurological:  normal muscle tone. Coordination normal. No atrophy Skin: Skin warm and dry Psychiatric: normal affect, pleasant  Recent Labs: 08/06/2021: B Natriuretic Peptide 555.3 08/23/2021: ALT 64; Hemoglobin 10.3; Magnesium 2.1; Platelets 277 09/30/2021: BUN 16; Creatinine, Ser 0.67; Potassium 3.9; Sodium 135    Lipid Panel Lab Results  Component Value Date   CHOL 100 08/07/2021   HDL 44 08/07/2021   LDLCALC 47 08/07/2021   TRIG 46 08/07/2021      Wt Readings from Last 3 Encounters:  01/03/22 126 lb 4 oz (57.3 kg)  10/15/21 122 lb 11.2 oz (55.7 kg)  09/30/21 123 lb (55.8 kg)     ASSESSMENT AND PLAN:  Paroxysmal atrial fibrillation (HCC) -  Atrial  fibrillation noted on EKG November 2022, she is on warfarin,  Persistent since that time No sx, feels well, happy to stay in atrial fibrillation Stop amiodarone Discussed that even if we did cardioversion, she would be high risk of recurrent arrhythmia given mitral valve disease, dilated left atrium  Shortness of breath Continue current medications, denies significant shortness of breath Bronchitis resolved, appears euvolemic  PAD/carotid stenosis Prior stenting,  On Plavix, followed by vascular  Essential hypertension Blood pressure is well controlled on today's visit. No changes made to the medications.  Denies any dizziness  Chronic dizziness Previously seen by vestibular PT  Mitral valve stenosis Moderate mitral valve stenosis  by TEE Also with mitral valve regurgitation Weight, sedentary, no aggressive intervention at this time  Centrilobular emphysema (HCC) Breathing much better after recent bronchitis  Hyperlipidemia Cholesterol at goal no changes made    Total encounter time more than 30 minutes  Greater than 50% was spent in counseling and coordination of care with the patient     No orders of the defined types were placed in this encounter.     Signed, Esmond Plants, M.D., Ph.D. 01/03/2022  [place], [place]

## 2022-01-03 ENCOUNTER — Ambulatory Visit: Payer: Medicare Other | Admitting: Cardiovascular Disease

## 2022-01-03 ENCOUNTER — Encounter: Payer: Self-pay | Admitting: Cardiovascular Disease

## 2022-01-03 VITALS — BP 140/56 | HR 71 | Ht 65.0 in | Wt 126.2 lb

## 2022-01-03 DIAGNOSIS — I050 Rheumatic mitral stenosis: Secondary | ICD-10-CM | POA: Diagnosis not present

## 2022-01-03 DIAGNOSIS — I739 Peripheral vascular disease, unspecified: Secondary | ICD-10-CM | POA: Diagnosis not present

## 2022-01-03 DIAGNOSIS — I493 Ventricular premature depolarization: Secondary | ICD-10-CM

## 2022-01-03 DIAGNOSIS — I10 Essential (primary) hypertension: Secondary | ICD-10-CM

## 2022-01-03 DIAGNOSIS — I272 Pulmonary hypertension, unspecified: Secondary | ICD-10-CM | POA: Diagnosis not present

## 2022-01-03 DIAGNOSIS — E785 Hyperlipidemia, unspecified: Secondary | ICD-10-CM

## 2022-01-03 DIAGNOSIS — I6523 Occlusion and stenosis of bilateral carotid arteries: Secondary | ICD-10-CM | POA: Diagnosis not present

## 2022-01-03 DIAGNOSIS — I480 Paroxysmal atrial fibrillation: Secondary | ICD-10-CM | POA: Diagnosis not present

## 2022-01-03 DIAGNOSIS — I25118 Atherosclerotic heart disease of native coronary artery with other forms of angina pectoris: Secondary | ICD-10-CM | POA: Diagnosis not present

## 2022-01-03 NOTE — Patient Instructions (Addendum)
Medication Instructions:  Stop amiodarone  If you need a refill on your cardiac medications before your next appointment, please call your pharmacy.   Lab work: No new labs needed  Testing/Procedures: No new testing needed  Follow-Up: At [facility], you and your health needs are our priority.  As part of our continuing mission to provide you with exceptional heart care, we have created designated Provider Care Teams.  These Care Teams include your primary Cardiologist (physician) and Advanced Practice Providers (APPs -  Physician Assistants and Nurse Practitioners) who all work together to provide you with the care you need, when you need it.  You will need a follow up appointment in 6 months, App ok  Providers on your designated Care Team:   Murray Hodgkins, NP Christell Faith, PA-C Cadence Kathlen Mody, [place]  COVID-19 Vaccine Information can be found at: [email or web address] For questions related to vaccine distribution or appointments, please email [email or web address] or call 9200570274.

## 2022-01-06 ENCOUNTER — Telehealth: Payer: Self-pay

## 2022-01-06 NOTE — Telephone Encounter (Signed)
-----   Message from Ralene Bathe, MD sent at 01/03/2022  6:37 PM EDT ----- Diagnosis 1. Skin , left forearm posterior WELL DIFFERENTIATED SQUAMOUS CELL CARCINOMA, BASE INVOLVED 2. Skin , right dorsum hand WELL DIFFERENTIATED SQUAMOUS CELL CARCINOMA WITH SUPERFICIAL INFILTRATION, PERIPHERAL MARGIN INVOLVED  1- Cancer - SCC Already treated Recheck next visit 2- Recurrent SCC Schedule surgery

## 2022-01-06 NOTE — Telephone Encounter (Signed)
Patient informed of pathology results and surgery scheduled. 

## 2022-01-07 ENCOUNTER — Telehealth: Payer: Self-pay | Admitting: Pulmonary Disease

## 2022-01-07 DIAGNOSIS — J4520 Mild intermittent asthma, uncomplicated: Secondary | ICD-10-CM

## 2022-01-07 NOTE — Telephone Encounter (Signed)
She has not been seen in over a year.  The main reason for her need for oxygen was related to her cardiac disease.  We can discontinue the oxygen AGAINST MEDICAL ADVICE.

## 2022-01-07 NOTE — Telephone Encounter (Signed)
Spoke to patient's daughter, [name](DPR). [name] stated that patient is no longer wearing nocturnal oxygen.  She would like an order placed to [facility] to D/C oxygen.   Dr. Patsey Berthold, please advise. Thanks

## 2022-01-07 NOTE — Telephone Encounter (Signed)
Lm for patient's daughter, [name] (DPR).  

## 2022-01-07 NOTE — Telephone Encounter (Signed)
Hey this patient mainly sees a provider in office in [place]. She is wanting to stop oxygen from [place] because she no longer uses it.   Please advise

## 2022-01-08 ENCOUNTER — Ambulatory Visit (INDEPENDENT_AMBULATORY_CARE_PROVIDER_SITE_OTHER): Payer: Medicare Other

## 2022-01-08 DIAGNOSIS — I480 Paroxysmal atrial fibrillation: Secondary | ICD-10-CM

## 2022-01-08 DIAGNOSIS — Z5181 Encounter for therapeutic drug level monitoring: Secondary | ICD-10-CM | POA: Diagnosis not present

## 2022-01-08 DIAGNOSIS — I050 Rheumatic mitral stenosis: Secondary | ICD-10-CM | POA: Diagnosis not present

## 2022-01-08 LAB — POCT INR: INR: 1.5 — AB (ref 2.0–3.0)

## 2022-01-08 NOTE — Patient Instructions (Signed)
-  TAKE 2 TABLETS TODAY ONLY - continue warfarin dosage of 1 tablet every day - Recheck INR in 2 weeks

## 2022-01-08 NOTE — Telephone Encounter (Signed)
Patient's daughter, [name](DPR) is aware of below message and voiced her understanding.  Order placed to [place].  Nothing further needed.

## 2022-01-10 ENCOUNTER — Encounter: Payer: Self-pay | Admitting: Dermatology

## 2022-01-14 ENCOUNTER — Telehealth: Payer: Self-pay | Admitting: Cardiovascular Disease

## 2022-01-14 NOTE — Telephone Encounter (Signed)
Called and spoke with patient.   Patient reports that her BP had been in the 94Z systolic over the weekend but had started to come up yesterday and today and she just took it prior to me calling and it was 118/79. Pt reports that the lightheadedness is much better now that her pressure is better.   Advised patient to stay well hydrated, change positions slowly, and if her BP drops and the lightheadedness returns or she passes out that she should be evaluated in the ER.   Regarding the SOB, pt reports that it's no worse than her baseline. Advised patient to call her PCP or pulmonologist for evaluation of her breathing.   Pt verbalized understanding of everything discussed and voiced appreciation for the call.

## 2022-01-14 NOTE — Telephone Encounter (Signed)
Pt c/o BP issue: STAT if pt c/o blurred vision, one-sided weakness or slurred speech  1. What are your last 5 BP readings?  109/58 110/62  2. Are you having any other symptoms (ex. Dizziness, headache, blurred vision, passed out)? lightheadedness  3. What is your BP issue? Low BP    Pt c/o Shortness Of Breath: STAT if SOB developed within the last 24 hours or pt is noticeably SOB on the phone  1. Are you currently SOB (can you hear that pt is SOB on the phone)? no  2. How long have you been experiencing SOB? For a few days  3. Are you SOB when sitting or when up moving around? More when she is moving around   4. Are you currently experiencing any other symptoms? Low BP.

## 2022-01-14 NOTE — Progress Notes (Unsigned)
Follow-Up Visit   Subjective  Molly Hodges is a 88 y.o. female who presents for the following: Other (Recheck SCC of right hand, R/O SCC of left forearm). The patient has spots, moles and lesions to be evaluated, some may be new or changing and the patient has concerns that these could be cancer.  The following portions of the chart were reviewed this encounter and updated as appropriate:   Tobacco  Allergies  Meds  Problems  Med Hx  Surg Hx  Fam Hx     Review of Systems:  No other skin or systemic complaints except as noted in HPI or Assessment and Plan.  Objective  Well appearing patient in no apparent distress; mood and affect are within normal limits.  A focused examination was performed including left arm, right hand. Relevant physical exam findings are noted in the Assessment and Plan.  Left Forearm - Posterior 1.8 cm crusted hyperkeratotic papule  Right dorsum hand 1.5 cm ulcerated plaque with rolled border   Assessment & Plan  Neoplasm of uncertain behavior of skin (2) Left Forearm - Posterior Epidermal / dermal shaving  Lesion diameter (cm):  1.8 Informed consent: discussed and consent obtained   Timeout: patient name, date of birth, surgical site, and procedure verified   Procedure prep:  Patient was prepped and draped in usual sterile fashion Prep type:  Isopropyl alcohol Anesthesia: the lesion was anesthetized in a standard fashion   Anesthetic:  1% lidocaine w/ epinephrine 1-100,000 buffered w/ 8.4% NaHCO3 Instrument used: flexible razor blade   Hemostasis achieved with: pressure, aluminum chloride and electrodesiccation   Outcome: patient tolerated procedure well   Post-procedure details: sterile dressing applied and wound care instructions given   Dressing type: bandage and petrolatum    Destruction of lesion Complexity: extensive   Destruction method: electrodesiccation and curettage   Informed consent: discussed and consent obtained    Timeout:  patient name, date of birth, surgical site, and procedure verified Procedure prep:  Patient was prepped and draped in usual sterile fashion Prep type:  Isopropyl alcohol Anesthesia: the lesion was anesthetized in a standard fashion   Anesthetic:  1% lidocaine w/ epinephrine 1-100,000 buffered w/ 8.4% NaHCO3 Curettage performed in three different directions: Yes   Electrodesiccation performed over the curetted area: Yes   Lesion length (cm):  1.8 Lesion width (cm):  1.8 Margin per side (cm):  0.2 Final wound size (cm):  2.2 Hemostasis achieved with:  pressure and aluminum chloride Outcome: patient tolerated procedure well with no complications   Post-procedure details: sterile dressing applied and wound care instructions given   Dressing type: bandage and petrolatum    Specimen 1 - Surgical pathology Differential Diagnosis: SCC vs other  Check Margins: No EDC today  Right dorsum hand Epidermal / dermal shaving  Lesion diameter (cm):  1.5 Informed consent: discussed and consent obtained   Timeout: patient name, date of birth, surgical site, and procedure verified   Procedure prep:  Patient was prepped and draped in usual sterile fashion Prep type:  Isopropyl alcohol Anesthesia: the lesion was anesthetized in a standard fashion   Anesthetic:  1% lidocaine w/ epinephrine 1-100,000 buffered w/ 8.4% NaHCO3 Instrument used: flexible razor blade   Hemostasis achieved with: pressure, aluminum chloride and electrodesiccation   Outcome: patient tolerated procedure well   Post-procedure details: sterile dressing applied and wound care instructions given   Dressing type: bandage and petrolatum    Specimen 2 - Surgical pathology Differential Diagnosis: R/O recurrent SCC  Check Margins: No [phone]  Return for Follow up as scheduled.  I, Ashok Cordia, CMA, am acting as scribe for [name], MD . Documentation: I have reviewed the above documentation for accuracy and  completeness, and I agree with the above.  [name], MD

## 2022-01-15 ENCOUNTER — Other Ambulatory Visit: Payer: Self-pay | Admitting: Cardiovascular Disease

## 2022-01-15 ENCOUNTER — Encounter: Payer: Self-pay | Admitting: Dermatology

## 2022-01-15 NOTE — Telephone Encounter (Signed)
Received request for warfarin refill:  Last INR was 1.5 on 01/08/22 Next INR due 01/22/22 LOV was 01/03/22  Johnny Bridge MD  Refill approved

## 2022-01-15 NOTE — Telephone Encounter (Signed)
Refill Request.  

## 2022-01-20 ENCOUNTER — Telehealth: Payer: Self-pay | Admitting: Cardiovascular Disease

## 2022-01-20 NOTE — Telephone Encounter (Signed)
Patient wants to make sure if it is safe that she is taking 3 blood thinners to together. Please call to discuss

## 2022-01-20 NOTE — Telephone Encounter (Signed)
Called pt to discuss concerns.  Pt realized she has been taking warfarin, plavix and clopidogrel and wondered if she should be taking 3 blood thinners.  Explained to pt that plavix and clopidogrel are the same drug. She was not aware and states she has been taking all three for a while.  Told pt to take the plavix '75mg'$  just once daily for her carotid disease and current dose of warfarin for her atrial fib.  She verbalized understanding.  Message sent to Dr Rockey Situ for his review.

## 2022-01-22 ENCOUNTER — Ambulatory Visit (INDEPENDENT_AMBULATORY_CARE_PROVIDER_SITE_OTHER): Payer: Medicare Other

## 2022-01-22 DIAGNOSIS — Z5181 Encounter for therapeutic drug level monitoring: Secondary | ICD-10-CM | POA: Diagnosis not present

## 2022-01-22 DIAGNOSIS — I480 Paroxysmal atrial fibrillation: Secondary | ICD-10-CM

## 2022-01-22 DIAGNOSIS — I050 Rheumatic mitral stenosis: Secondary | ICD-10-CM | POA: Diagnosis not present

## 2022-01-22 LAB — POCT INR: INR: 1.9 — AB (ref 2.0–3.0)

## 2022-01-22 NOTE — Patient Instructions (Signed)
-   INCREASE TO 1 tablet every day, EXCEPT 1.5 ON WEDNESDAY - Recheck INR in 4 weeks

## 2022-01-26 ENCOUNTER — Emergency Department
Admission: EM | Admit: 2022-01-26 | Discharge: 2022-01-26 | Disposition: A | Discharge status: Home / Self Care | Payer: Medicare Other | Attending: Emergency Medicine | Admitting: Emergency Medicine

## 2022-01-26 ENCOUNTER — Emergency Department: Payer: Medicare Other

## 2022-01-26 DIAGNOSIS — R0602 Shortness of breath: Secondary | ICD-10-CM | POA: Diagnosis not present

## 2022-01-26 DIAGNOSIS — R079 Chest pain, unspecified: Secondary | ICD-10-CM | POA: Diagnosis not present

## 2022-01-26 DIAGNOSIS — J449 Chronic obstructive pulmonary disease, unspecified: Secondary | ICD-10-CM | POA: Diagnosis not present

## 2022-01-26 DIAGNOSIS — J45909 Unspecified asthma, uncomplicated: Secondary | ICD-10-CM | POA: Insufficient documentation

## 2022-01-26 DIAGNOSIS — Z7902 Long term (current) use of antithrombotics/antiplatelets: Secondary | ICD-10-CM | POA: Insufficient documentation

## 2022-01-26 DIAGNOSIS — Z20822 Contact with and (suspected) exposure to covid-19: Secondary | ICD-10-CM | POA: Diagnosis not present

## 2022-01-26 DIAGNOSIS — I110 Hypertensive heart disease with heart failure: Secondary | ICD-10-CM | POA: Diagnosis not present

## 2022-01-26 DIAGNOSIS — Z7901 Long term (current) use of anticoagulants: Secondary | ICD-10-CM | POA: Diagnosis not present

## 2022-01-26 DIAGNOSIS — R0789 Other chest pain: Secondary | ICD-10-CM | POA: Diagnosis not present

## 2022-01-26 DIAGNOSIS — I509 Heart failure, unspecified: Secondary | ICD-10-CM

## 2022-01-26 DIAGNOSIS — R0600 Dyspnea, unspecified: Secondary | ICD-10-CM | POA: Diagnosis not present

## 2022-01-26 DIAGNOSIS — I480 Paroxysmal atrial fibrillation: Secondary | ICD-10-CM | POA: Insufficient documentation

## 2022-01-26 DIAGNOSIS — J90 Pleural effusion, not elsewhere classified: Secondary | ICD-10-CM | POA: Diagnosis not present

## 2022-01-26 LAB — BASIC METABOLIC PANEL
Anion gap: 9 (ref 5–15)
BUN: 19 mg/dL (ref 8–23)
CO2: 24 mmol/L (ref 22–32)
Calcium: 8.5 mg/dL — ABNORMAL LOW (ref 8.9–10.3)
Chloride: 103 mmol/L (ref 98–111)
Creatinine, Ser: 0.93 mg/dL (ref 0.44–1.00)
GFR, Estimated: 59 mL/min — ABNORMAL LOW (ref >60)
Glucose, Bld: 170 mg/dL — ABNORMAL HIGH (ref 70–99)
Potassium: 4.1 mmol/L (ref 3.5–5.1)
Sodium: 136 mmol/L (ref 135–145)

## 2022-01-26 LAB — CBC
HCT: 35.5 % — ABNORMAL LOW (ref 36.0–46.0)
Hemoglobin: 10.4 g/dL — ABNORMAL LOW (ref 12.0–15.0)
MCH: 21.2 pg — ABNORMAL LOW (ref 26.0–34.0)
MCHC: 29.3 g/dL — ABNORMAL LOW (ref 30.0–36.0)
MCV: 72.3 fL — ABNORMAL LOW (ref 80.0–100.0)
Platelets: 209 10*3/uL (ref 150–400)
RBC: 4.91 MIL/uL (ref 3.87–5.11)
RDW: 19.7 % — ABNORMAL HIGH (ref 11.5–15.5)
WBC: 8.8 10*3/uL (ref 4.0–10.5)
nRBC: 0 % (ref 0.0–0.2)

## 2022-01-26 LAB — RESP PANEL BY RT-PCR (FLU A&B, COVID) ARPGX2
Influenza A by PCR: NEGATIVE
Influenza B by PCR: NEGATIVE
SARS Coronavirus 2 by RT PCR: NEGATIVE

## 2022-01-26 LAB — PROTIME-INR
INR: 1.6 — ABNORMAL HIGH (ref 0.8–1.2)
Prothrombin Time: 18.9 seconds — ABNORMAL HIGH (ref 11.4–15.2)

## 2022-01-26 LAB — BRAIN NATRIURETIC PEPTIDE: B Natriuretic Peptide: 1854.5 pg/mL — ABNORMAL HIGH (ref 0.0–100.0)

## 2022-01-26 LAB — TROPONIN I (HIGH SENSITIVITY)
Troponin I (High Sensitivity): 19 ng/L — ABNORMAL HIGH (ref <18)
Troponin I (High Sensitivity): 17 ng/L (ref <18)

## 2022-01-26 MED ORDER — IOHEXOL 350 MG/ML SOLN
75.0000 mL | Freq: Once | INTRAVENOUS | Status: AC | PRN
Start: 2022-01-26 — End: 2022-01-26
  Administered 2022-01-26: 75 mL via INTRAVENOUS

## 2022-01-26 MED ORDER — FUROSEMIDE 10 MG/ML IJ SOLN: 40.0000 mg | Freq: Once | INTRAMUSCULAR | Status: DC

## 2022-01-26 MED ORDER — FUROSEMIDE 10 MG/ML IJ SOLN
60.0000 mg | Freq: Once | INTRAMUSCULAR | Status: AC
  Administered 2022-01-26: 60 mg via INTRAVENOUS
  Filled 2022-01-26: qty 8

## 2022-01-26 NOTE — ED Provider Notes (Signed)
Discussed CT angiography with the patient.  She does have evidence of pulmonary edema, offered admission for diuresis however she reports she is feeling well and has a 86 year old husband at home and would prefer to go home.  She states that she can increase her Lasix dosage and has good outpatient follow-up.  We will give her a dose of IV Lasix prior to discharge after discussion.  She knows she can return at any time if any worsening symptoms   Lavonia Drafts, MD 01/26/22 1801

## 2022-01-26 NOTE — ED Notes (Signed)
Pt reports left sided chest tightness and SOB. States she has tried her home nebs and inhaler with minimal relief. Pt believes it has to do with the poor air quality.

## 2022-01-26 NOTE — ED Provider Notes (Signed)
[facility] Provider Note    Event Date/Time   First MD Initiated Contact with Patient 01/26/22 1413     (approximate)   History   Chest Pain and Shortness of Breath   HPI  Molly Hodges is a 88 y.o. female who on review of primary care note from Dr. Rockey Situ in February has a history of paroxysmal A-fib, COPD, asthma, mitral valvular disease, pericardial effusion, chronic dizziness, carotid stenosis.  She is remained on Coumadin as well as Plavix  She reports that for several days now particularly since around Thursday she has had a feeling of shortness of breath and a slight feeling of chest tightness.  She reports "it is not chest pain" reports it does not feel anything like chest pain she has had when she had heart issues.  She reports rather it feels like a feeling like she cannot take a good breath.  No wheezing.  She has used her albuterol inhaler at home and it does not seem to be helping.  Denies fever or cough.  She has noticed a slight amount of swelling in both of her legs but a little more than typical for the last few days as well.  Continues to take Lasix daily  Denies history of congestive heart failure.  She is compliant with use of Coumadin.  She takes her A-fib and has no history of blood clots in the lung or pulmonary embolism  No chest pain.  No sharp pain in the chest with breaths.      Physical Exam   Triage Vital Signs: ED Triage Vitals [01/26/22 1333]  Enc Vitals Group     BP (!) 116/56     Pulse Rate 75     Resp (!) 25     Temp 98.5 F (36.9 C)     Temp Source Oral     SpO2 95 %     Weight      Height      Head Circumference      Peak Flow      Pain Score 2     Pain Loc      Pain Edu?      Excl. in [place]?     Most recent vital signs: Vitals:   01/26/22 1500 01/26/22 1530  BP: (!) 114/49 (!) 122/56  Pulse: (!) 58 67  Resp: 15 16  Temp:    SpO2: 99% 97%     General: Awake, no distress.  Here with family  very pleasant. CV:  Good peripheral perfusion.  Normal heart tones no murmur except slightly irregular.  Normal rate.  Well perfused peripherally Resp:  Normal effort.  Able to take good inspiration, good air movement without wheezing, scant and very faint crackles are noted in the bases bilaterally. Abd:  No distention.  No anasarca Other:  Mild 1+ pitting edema to the level of the mid shins bilaterally, patient reports slightly worse than typical.   ED Results / Procedures / Treatments   Labs (all labs ordered are listed, but only abnormal results are displayed) Labs Reviewed  BASIC METABOLIC PANEL - Abnormal; Notable for the following components:      Result Value   Glucose, Bld 170 (*)    Calcium 8.5 (*)    GFR, Estimated 59 (*)    All other components within normal limits  CBC - Abnormal; Notable for the following components:   Hemoglobin 10.4 (*)    HCT 35.5 (*)  MCV 72.3 (*)    MCH 21.2 (*)    MCHC 29.3 (*)    RDW 19.7 (*)    All other components within normal limits  PROTIME-INR - Abnormal; Notable for the following components:   Prothrombin Time 18.9 (*)    INR 1.6 (*)    All other components within normal limits          All other components within normal limits  TROPONIN I (HIGH SENSITIVITY) - Abnormal; Notable for the following components:   Troponin I (High Sensitivity) 19 (*)    All other components within normal limits  RESP PANEL BY RT-PCR (FLU A&B, COVID) ARPGX2  TROPONIN I (HIGH SENSITIVITY)   Labs interpreted as normal metabolic panel, slight anemia which is chronic, INR subtherapeutic 1.6.  First troponin 19, minimally elevated but in keeping with historical for this patient and second troponin will be performed  EKG  Interpreted by me at 1330, rate 75, QRS normal, morphology notable for no acute ischemic abnormality denoted, similar in appearance to previous ECGs.  A-fib  EKG reviewed similar to previous EKG in morphology, however amplitude slightly  decreased.  Consideration made for possible pericardial effusion as a potential etiology  RADIOLOGY  Personally interpreted the patient's chest x-ray, Very small pleural effusion bilateral and increased interstitial markings.  Question volume overload, no obvious infiltrate.  No pneumothorax.  CT chest pending at time of signout to be followed up by oncoming attending   PROCEDURES:  Critical Care performed: No  Procedures   MEDICATIONS ORDERED IN ED: Medications - No data to display   IMPRESSION / MDM / [place] / ED COURSE  I reviewed the triage vital signs and the nursing notes.                              Differential diagnosis includes, but is not limited to, volume overload, CHF, PE given subtherapeutic INR, ACS, COPD asthma etc.  Imaging reassuring without evidence of pneumothorax or obvious infiltrate.  No infectious symptoms.  Normal white count afebrile.  Lung sounds with slight crackles in the bases bilaterally and no wheezing arguing against COPD or reactive airway disease.  She is respirating quite comfortably with normal oxygen saturation on room air.  Suspect etiology may be underlying volume overload, and initial troponin reassuring.  Awaiting BNP, repeat troponin, CT angiogram of the chest at the time of signout which Dr. Corky Downs follow-up on  Patient's presentation is most consistent with acute presentation with potential threat to life or bodily function.  The patient is on the cardiac monitor to evaluate for evidence of arrhythmia and/or significant heart rate changes.  Clinical Course as of 01/26/22 1537  Sun Jan 26, 2022  1439 October 2022 echocardiogram reviewed by me and noted as normal biventricular function without  evidence of hemodynamically significant valvular heart disease [MQ]    Clinical Course User Index [MQ] Delman Kitten, MD   Further work-up pending at time of signout to oncoming attending.  FINAL CLINICAL IMPRESSION(S) / ED  DIAGNOSES   Final diagnoses:  Dyspnea, unspecified type     Rx / DC Orders   ED Discharge Orders     None        Note:  This document was prepared using Dragon voice recognition software and may include unintentional dictation errors.   Delman Kitten, MD 01/26/22 1539

## 2022-01-26 NOTE — ED Triage Notes (Signed)
Pt comes pov with shob and cp. Hx of afib.

## 2022-01-27 ENCOUNTER — Telehealth: Payer: Self-pay | Admitting: Cardiovascular Disease

## 2022-01-27 ENCOUNTER — Telehealth: Payer: Self-pay | Admitting: Family

## 2022-01-27 NOTE — Telephone Encounter (Signed)
Pt called wanting Dr. Donivan Scull thoughts about her being scheduled with a different cardiologist. Pt states she was a confused why she was not scheduled with him instead.

## 2022-01-27 NOTE — Telephone Encounter (Signed)
Called and spoke with patient  Advised that she was going to be seen in the HF clinic, not by another cardiologist, and that Darylene Price, NP isn't replacing Dr. Rockey Situ, but is just going to be another part of her care team.   Patient verbalized understanding and voiced appreciation for the call.

## 2022-01-27 NOTE — Progress Notes (Signed)
Patient ID: Molly Hodges, female    DOB: [birth year], 88 y.o.   MRN: [id number]  HPI  Molly Hodges is 88 y/o female with a history of asthma, carotid disease, CAD, hyperlipidemia, HTN, COPD, glaucoma, mitral stenosis, atrial fibrillation and chronic heart failure.   Echo report from 06/13/21 reviewed and showed an EF of 55-60% along with moderate MR/Molly, moderate LAE and moderate (Grade III) atheroma plaque involving the ascending, transverse and descending aorta.   LHC done 08/07/21 and showed:   1st Diag-1 lesion is 20% stenosed.   Non-stenotic Ost LAD to Mid LAD lesion was previously treated.   Non-stenotic 1st Diag-2 lesion was previously treated.   1.  Widely patent LAD/diagonal stents with no significant restenosis. There is mild ostial stenosis in the first diagonal with somewhat hazy appearance but this does not appear to be flow-limiting. 2.  Mildly elevated left ventricular end-diastolic pressure at 19 mmHg.  Left ventricular angiography was not performed.  Was in the ED 01/26/22 due to shortness of breath and a slight feeling of chest tightness. Chest CT shows pulmonary edema, IV lasix provided, symptoms improved and she was released.   She presents today for her initial visit with a chief complaint of moderate shortness of breath with minimal exertion. She describes this as chronic in nature although occurring on an intermittent basis. She has associated fatigue, wheezing, pedal edema, palpitations and easy bruising along with this. She denies any difficulty sleeping, dizziness, cough, chest pain or weight gain.   She says that a few weeks ago she was able to make cakes and cookies but now she's so SOB just walking to the bathroom. She voices frustration in that "no one can figure out what's wrong" and she just keeps "being sent to this place and then that place".   Would like to try using stiolto respirat inhaler as "they keep giving me an albuterol inhaler and it makes my heart  race".   Doesn't feel any difference since being in the ED or increasing the diuretic.   Past Medical History:  Diagnosis Date   Aortic atherosclerosis ([place])    a. 05/2021 TEE: GrIII atheroma plaque involving the asc, transverse, and desc Ao.   Asthma    CAD (coronary artery disease)    a. 04/2021 Cath: LM nl, LAD 85p/m, D1 80, RI nl, LCX nl, RCA nl; b. 07/2021 PCI: pLAD (2.75x26 Onyx Frontier DES), D1 (2.5x22 Onyx Frontier DES).   Carotid artery disease ([place])    a. s/p R carotid stenting (50m x 770mx 4cm long Exact stent); b. 06/2021 U/S: RICA 4063-01%LICA 4060-10%  CHF (congestive heart failure) ([facility]   Community acquired pneumonia    COPD [place]ronic obstructive [place] disease) [place]   Essential hypertension    Glaucoma    Hyperlipidemia    Mitral regurgitation    a. TTE 08/2015: EF 60-65%, normal wall motion, mild MR, mildly dilated left atrium measuring 40 mm, RVSF normal, PASP normal; b. 05/2021 TEE: Moderate MR.   Mitral stenosis    a. 05/2021 L/RHC: Sev Molly w/ mean grad 13-1763m and MV area of 0.5-06.cm^2; b. 05/2021 TEE: EF 55-60%, no rwma, nl RV fxn, mod MR, mod Molly (MV area by P1/2t: 1.61 cm^2 w/ mean grad of 9mm59m.   Peripheral neuropathy    Persistent atrial fibrillation ([facility])[place]/30/2017   a. s/p DCCV 11/15/2015; b. CHADS2VASc => 4 (HTN, age x 2, female)--> warfarin; c. 06/2021 recurrent afib-->amio added.   Squamous  cell carcinoma of skin 12/18/2021   R dorsum hand, EDC   Squamous cell carcinoma of skin 12/31/2021   R hand dorsum, recurrent - surgery scheduled   Squamous cell carcinoma of skin 12/31/2021   L forearm - ED&C   Past Surgical History:  Procedure Laterality Date   ABDOMINAL HYSTERECTOMY  1987   due to heavy bleeding   APPENDECTOMY     CARDIAC CATHETERIZATION     CAROTID PTA/STENT INTERVENTION Right 08/15/2020   Procedure: CAROTID PTA/STENT INTERVENTION;  Surgeon: Algernon Huxley, MD;  Location: [place];  Service: Cardiovascular;  Laterality:  Right;   CATARACT EXTRACTION     CORONARY STENT INTERVENTION N/A 08/02/2021   Procedure: CORONARY STENT INTERVENTION;  Surgeon: Nelva Bush, MD;  Location: [place];  Service: Cardiovascular;  Laterality: N/A;   ELECTROPHYSIOLOGIC STUDY N/A 11/15/2015   Procedure: CARDIOVERSION;  Surgeon: Minna Merritts, MD;  Location: [facility];  Service: Cardiovascular;  Laterality: N/A;   INTRAVASCULAR IMAGING/OCT N/A 08/02/2021   Procedure: INTRAVASCULAR IMAGING/OCT;  Surgeon: Nelva Bush, MD;  Location: [facility];  Service: Cardiovascular;  Laterality: N/A;   LEFT HEART CATH AND CORONARY ANGIOGRAPHY N/A 08/02/2021   Procedure: LEFT HEART CATH AND CORONARY ANGIOGRAPHY;  Surgeon: Nelva Bush, MD;  Location: [facility];  Service: Cardiovascular;  Laterality: N/A;   LEFT HEART CATH AND CORONARY ANGIOGRAPHY N/A 08/07/2021   Procedure: LEFT HEART CATH AND CORONARY ANGIOGRAPHY;  Surgeon: Wellington Hampshire, MD;  Location: [facility];  Service: Cardiovascular;  Laterality: N/A;   RIGHT/LEFT HEART CATH AND CORONARY ANGIOGRAPHY N/A 05/17/2021   Procedure: RIGHT/LEFT HEART CATH AND CORONARY ANGIOGRAPHY;  Surgeon: Nelva Bush, MD;  Location: [facility];  Service: Cardiovascular;  Laterality: N/A;   TEE WITHOUT CARDIOVERSION N/A 06/13/2021   Procedure: TRANSESOPHAGEAL ECHOCARDIOGRAM (TEE);  Surgeon: Minna Merritts, MD;  Location: [facility];  Service: Cardiovascular;  Laterality: N/A;   Family History  Problem Relation Age of Onset   CAD Mother    CAD Father    Cancer Son 84       lung cancer   Social History   Tobacco Use   Smoking status: Never   Smokeless tobacco: Never   Tobacco comments:    never   Substance Use Topics   Alcohol use: No   Allergies  Allergen Reactions   Shellfish Allergy Anaphylaxis    Other reaction(s): Hallucination   Azithromycin Other (See Comments)    Extreme burning sensation at IV site    Tamiflu  [Oseltamivir Phosphate] Other (See Comments)    Reaction:  Hallucinations     Albuterol Palpitations    Heart racing.    Prior to Admission medications   Medication Sig Start Date End Date Taking? Authorizing Provider  Calcium Carbonate-Vitamin D 600-400 MG-UNIT tablet Take 1 tablet by mouth daily.   Yes [provider]  cholecalciferol (VITAMIN D) 1000 UNITS tablet Take 1,000 Units by mouth daily.   Yes [provider]  clopidogrel (PLAVIX) 75 MG tablet Take 1 tablet (75 mg total) by mouth daily. 08/03/21  Yes Strader, [place] M, PA-C  diltiazem (CARDIZEM CD) 180 MG 24 hr capsule TAKE 1 CAPSULE EVERY DAY 06/07/21  Yes Birdie Sons, MD  dorzolamide (TRUSOPT) 2 % ophthalmic solution Place 1 drop into both eyes 2 (two) times daily.    Yes [provider]  ezetimibe (ZETIA) 10 MG tablet TAKE 1 TABLET BY MOUTH DAILY. 06/07/21  Yes  Minna Merritts, MD  furosemide (LASIX) 40 MG tablet Take 1 tablet (40 mg total) by mouth daily. Take extra tab in the evening (2 pm) for swelling or shortness of breath) 08/26/21 08/26/22 Yes Gollan, Kathlene November, MD  hydrochlorothiazide (MICROZIDE) 12.5 MG capsule Take 12.5 mg by mouth daily. 10/21/21  Yes [provider]  ipratropium-albuterol (DUONEB) 0.5-2.5 (3) MG/3ML SOLN TAKE 3MLS BY NEBULIZATION EVERY 4 HOURS AS NEEDED 11/12/21  Yes Birdie Sons, MD  isosorbide mononitrate (IMDUR) 30 MG 24 hr tablet Take 1 tablet (30 mg total) by mouth every evening. 09/30/21  Yes Gollan, Kathlene November, MD  latanoprost (XALATAN) 0.005 % ophthalmic solution Place 1 drop into both eyes at bedtime.    Yes [provider]  levalbuterol (XOPENEX HFA) 45 MCG/ACT inhaler Inhale 2 puffs into the lungs every 6 (six) hours as needed for wheezing. 09/02/21  Yes Birdie Sons, MD  magnesium oxide (MAG-OX) 400 MG tablet Take 400 mg by mouth daily.   Yes [provider]  Multiple Vitamins-Minerals (PRESERVISION AREDS 2+MULTI VIT) CAPS Take 2  capsules by mouth daily.   Yes [provider]  nitroGLYCERIN (NITROSTAT) 0.4 MG SL tablet Place 1 tablet (0.4 mg total) under the tongue every 5 (five) minutes as needed (for chest pain or shortness of breath). 05/17/21 05/17/22 Yes End, Harrell Gave, MD  potassium chloride (KLOR-CON) 10 MEQ tablet Take 2 tablets (20 mEq total) by mouth daily. Take an extra 20 mEq when taking extra lasix 08/26/21  Yes Gollan, Kathlene November, MD  pregabalin (LYRICA) 50 MG capsule TAKE 1 CAPSULE BY MOUTH 2 TIMES DAILY 11/12/21  Yes Birdie Sons, MD  rosuvastatin (CRESTOR) 20 MG tablet TAKE 1 TABLET BY MOUTH DAILY. 06/07/21  Yes Gollan, Kathlene November, MD  vitamin C (ASCORBIC ACID) 500 MG tablet Take 500 mg by mouth daily.   Yes [provider]  vitamin E 100 UNIT capsule Take 200 Units by mouth daily.   Yes [provider]  warfarin (COUMADIN) 2.5 MG tablet TAKE 1 TABLET BY MOUTH DAILY AS DIRECTEDBY THE ANTI-COAG CLINIC 01/15/22  Yes Gollan, Kathlene November, MD   Review of Systems  Constitutional:  Positive for fatigue. Negative for appetite change.  HENT:  Negative for congestion, postnasal drip and sore throat.   Eyes: Negative.   Respiratory:  Positive for shortness of breath (easily) and wheezing. Negative for cough.   Cardiovascular:  Positive for palpitations and leg swelling. Negative for chest pain.  Gastrointestinal:  Negative for abdominal distention and abdominal pain.  Endocrine: Negative.   Genitourinary: Negative.   Musculoskeletal:  Negative for back pain and neck pain.  Skin: Negative.   Allergic/Immunologic: Negative.   Neurological:  Negative for dizziness and light-headedness.  Hematological:  Negative for adenopathy. Bruises/bleeds easily.  Psychiatric/Behavioral:  Negative for dysphoric mood and sleep disturbance. The patient is nervous/anxious.    Vitals:   01/28/22 1556  BP: (!) 121/54  Pulse: 65  Resp: 16  SpO2: 100%  Weight: 128 lb 8 oz (58.3 kg)  Height: '5\' 5"'$  (1.651  m)   Wt Readings from Last 3 Encounters:  01/28/22 128 lb 8 oz (58.3 kg)  01/26/22 125 lb 10.6 oz (57 kg)  01/03/22 126 lb 4 oz (57.3 kg)   Lab Results  Component Value Date   CREATININE 0.93 01/26/2022   CREATININE 0.67 09/30/2021   CREATININE 0.58 08/23/2021   Physical Exam Vitals and nursing note reviewed. Exam conducted with a chaperone present (friend).  Constitutional:  Appearance: Normal appearance.  HENT:     Head: Normocephalic and atraumatic.  Cardiovascular:     Rate and Rhythm: Normal rate. Rhythm irregular.  Pulmonary:     Effort: Pulmonary effort is normal.     Breath sounds: No wheezing, rhonchi or rales.  Abdominal:     General: There is no distension.     Palpations: Abdomen is soft.  Musculoskeletal:        General: No tenderness.     Cervical back: Normal range of motion and neck supple.     Right lower leg: Edema (1+ pitting to mid-shin) present.     Left lower leg: Edema (1+ pitting to mid-shin) present.  Skin:    General: Skin is warm and dry.  Neurological:     General: No focal deficit present.     Mental Status: She is alert and oriented to person, place, and time.  Psychiatric:        Mood and Affect: Mood is anxious.        Behavior: Behavior normal.        Thought Content: Thought content normal.   Assessment & Plan:  1: Chronic heart failure with preserved ejection fraction with structural changes (LAE)- - NYHA class III - euvolemic today - weighing daily; instructed to call for an overnight weight gain of > 2 pounds or a weekly weight gain of > 5 pounds - not adding salt to her foods - will change her diuretic to torsemide '40mg'$  BID; advised to stop the furosemide once she starts the torsemide - continue BID potassium - should she respond great to BID torsemide, she can decrease it to daily - will check BMP next week; this provider will be on vacation so will reach out to her cardiologist to order the labs - BNP 01/26/22 was  1854.5  2: HTN- - BP looks good (121/54) - saw PCP Molly Hodges) 09/02/21  - BMP 01/26/22 reviewed and showed sodium 136, potassium 4.1, creatinine 0.93 and GFR 59  3: Paroxysmal atrial fibrillation- - previous cardioversion done - saw cardiology Molly Hodges) 01/03/22 - on cardizem & warfarin  4: COPD- - using nebulizers PRN - will try the stiolto respimat inhaler  - called pulmonology office and appointment was scheduled for 04/10/22 - patient voices frustration that they can't see her until then - advised to try and call later to see if anything opens up    Medication list reviewed.   Offered to make a return appointment but patient declines and says that she will follow-up with cardiology. Advised her that she could call back at anytime if she changes her mind.

## 2022-01-27 NOTE — Telephone Encounter (Signed)
Spoke to patient and scheduled her for a ED hospital follow up for tomorrow 01/28/22 at [place], [place]

## 2022-01-28 ENCOUNTER — Ambulatory Visit: Payer: Medicare Other | Attending: Family | Admitting: Family

## 2022-01-28 ENCOUNTER — Encounter: Payer: Self-pay | Admitting: Family

## 2022-01-28 VITALS — BP 121/54 | HR 65 | Resp 16 | Ht 65.0 in | Wt 128.5 lb

## 2022-01-28 DIAGNOSIS — I110 Hypertensive heart disease with heart failure: Secondary | ICD-10-CM | POA: Diagnosis not present

## 2022-01-28 DIAGNOSIS — Z7901 Long term (current) use of anticoagulants: Secondary | ICD-10-CM | POA: Insufficient documentation

## 2022-01-28 DIAGNOSIS — I052 Rheumatic mitral stenosis with insufficiency: Secondary | ICD-10-CM | POA: Diagnosis not present

## 2022-01-28 DIAGNOSIS — J449 Chronic obstructive pulmonary disease, unspecified: Secondary | ICD-10-CM | POA: Diagnosis not present

## 2022-01-28 DIAGNOSIS — R0602 Shortness of breath: Secondary | ICD-10-CM | POA: Insufficient documentation

## 2022-01-28 DIAGNOSIS — I4819 Other persistent atrial fibrillation: Secondary | ICD-10-CM | POA: Diagnosis not present

## 2022-01-28 DIAGNOSIS — I5032 Chronic diastolic (congestive) heart failure: Secondary | ICD-10-CM | POA: Diagnosis not present

## 2022-01-28 DIAGNOSIS — Z79899 Other long term (current) drug therapy: Secondary | ICD-10-CM | POA: Insufficient documentation

## 2022-01-28 DIAGNOSIS — I2510 Atherosclerotic heart disease of native coronary artery without angina pectoris: Secondary | ICD-10-CM | POA: Diagnosis not present

## 2022-01-28 DIAGNOSIS — I480 Paroxysmal atrial fibrillation: Secondary | ICD-10-CM

## 2022-01-28 DIAGNOSIS — I10 Essential (primary) hypertension: Secondary | ICD-10-CM

## 2022-01-28 DIAGNOSIS — R002 Palpitations: Secondary | ICD-10-CM | POA: Insufficient documentation

## 2022-01-28 DIAGNOSIS — E785 Hyperlipidemia, unspecified: Secondary | ICD-10-CM | POA: Diagnosis not present

## 2022-01-28 DIAGNOSIS — H409 Unspecified glaucoma: Secondary | ICD-10-CM | POA: Insufficient documentation

## 2022-01-28 MED ORDER — STIOLTO RESPIMAT 2.5-2.5 MCG/ACT IN AERS: 2.0000 | INHALATION_SPRAY | Freq: Every day | RESPIRATORY_TRACT | 5 refills | Status: DC

## 2022-01-28 MED ORDER — TORSEMIDE 20 MG PO TABS: 40.0000 mg | ORAL_TABLET | Freq: Two times a day (BID) | ORAL | 3 refills | Status: DC

## 2022-01-28 NOTE — Patient Instructions (Addendum)
Continue weighing daily and call for an overnight weight gain of 3 pounds or more or a weekly weight gain of more than 5 pounds.   If you have voicemail, please make sure your mailbox is cleaned out so that we may leave a message and please make sure to listen to any voicemails.    Stop taking furosemide and begin  torsemide 2 tablets in the morning and 2 tablets in the evening

## 2022-01-31 ENCOUNTER — Other Ambulatory Visit: Payer: Self-pay | Admitting: Emergency Medicine

## 2022-01-31 DIAGNOSIS — I5032 Chronic diastolic (congestive) heart failure: Secondary | ICD-10-CM

## 2022-02-03 ENCOUNTER — Other Ambulatory Visit
Admission: RE | Admit: 2022-02-03 | Discharge: 2022-02-03 | Disposition: A | Discharge status: Home / Self Care | Payer: Medicare Other | Attending: Family | Admitting: Family

## 2022-02-03 DIAGNOSIS — I5032 Chronic diastolic (congestive) heart failure: Secondary | ICD-10-CM | POA: Diagnosis not present

## 2022-02-03 LAB — BASIC METABOLIC PANEL
Anion gap: 11 (ref 5–15)
BUN: 24 mg/dL — ABNORMAL HIGH (ref 8–23)
CO2: 25 mmol/L (ref 22–32)
Calcium: 8.6 mg/dL — ABNORMAL LOW (ref 8.9–10.3)
Chloride: 102 mmol/L (ref 98–111)
Creatinine, Ser: 1.11 mg/dL — ABNORMAL HIGH (ref 0.44–1.00)
GFR, Estimated: 48 mL/min — ABNORMAL LOW (ref >60)
Glucose, Bld: 84 mg/dL (ref 70–99)
Potassium: 4.5 mmol/L (ref 3.5–5.1)
Sodium: 138 mmol/L (ref 135–145)

## 2022-02-04 ENCOUNTER — Telehealth: Payer: Self-pay | Admitting: Family

## 2022-02-04 ENCOUNTER — Telehealth: Payer: Self-pay

## 2022-02-04 ENCOUNTER — Ambulatory Visit (INDEPENDENT_AMBULATORY_CARE_PROVIDER_SITE_OTHER): Payer: Medicare Other | Admitting: Dermatology

## 2022-02-04 ENCOUNTER — Encounter: Payer: Self-pay | Admitting: Dermatology

## 2022-02-04 DIAGNOSIS — C44622 Squamous cell carcinoma of skin of right upper limb, including shoulder: Secondary | ICD-10-CM

## 2022-02-04 DIAGNOSIS — C4492 Squamous cell carcinoma of skin, unspecified: Secondary | ICD-10-CM

## 2022-02-04 DIAGNOSIS — L988 Other specified disorders of the skin and subcutaneous tissue: Secondary | ICD-10-CM | POA: Diagnosis not present

## 2022-02-04 MED ORDER — CEPHALEXIN 500 MG PO CAPS: 500.0000 mg | ORAL_CAPSULE | Freq: Two times a day (BID) | ORAL | 0 refills | Status: AC

## 2022-02-04 MED ORDER — MUPIROCIN 2 % EX OINT: 1.0000 | TOPICAL_OINTMENT | Freq: Every day | CUTANEOUS | 0 refills | Status: DC

## 2022-02-04 NOTE — Telephone Encounter (Signed)
Called and notified patient that her sodium and potassium looked fine in her recent blood work but her kidney function has gotten slightly worse and per Deanna Artis, she will need to follow up with another provider in a month or so just to have it rechecked as she opted to no longer need our services.   Teofil Maniaci, [place]

## 2022-02-04 NOTE — Progress Notes (Signed)
   Follow-Up Visit   Subjective  Molly Hodges is a 88 y.o. female who presents for the following: Procedure (Biopsy proven recurrent SCC - excise today).  The following portions of the chart were reviewed this encounter and updated as appropriate:   Tobacco  Allergies  Meds  Problems  Med Hx  Surg Hx  Fam Hx     Review of Systems:  No other skin or systemic complaints except as noted in HPI or Assessment and Plan.  Objective  Well appearing patient in no apparent distress; mood and affect are within normal limits.  A focused examination was performed including right hand. Relevant physical exam findings are noted in the Assessment and Plan.  Right dorsum hand Healing biopsy site   Assessment & Plan  Squamous cell carcinoma of skin Right dorsum hand  Skin excision  Lesion length (cm):  1.5 Lesion width (cm):  1 Margin per side (cm):  0.2 Total excision diameter (cm):  1.9 Informed consent: discussed and consent obtained   Timeout: patient name, date of birth, surgical site, and procedure verified   Procedure prep:  Patient was prepped and draped in usual sterile fashion Prep type:  Isopropyl alcohol and povidone-iodine Anesthesia: the lesion was anesthetized in a standard fashion   Anesthetic:  1% lidocaine w/ epinephrine 1-100,000 buffered w/ 8.4% NaHCO3 Instrument used: #15 blade   Hemostasis achieved with: pressure   Hemostasis achieved with comment:  Electrocautery Outcome: patient tolerated procedure well with no complications   Post-procedure details: sterile dressing applied and wound care instructions given   Dressing type: bandage and pressure dressing (mupirocin)   Additional details:  Tagged with suture at proximal 12:00 edge  Skin repair Complexity:  Complex Final length (cm):  2.7 Reason for type of repair: reduce tension to allow closure, reduce the risk of dehiscence, infection, and necrosis, reduce subcutaneous dead space and avoid a  hematoma, allow closure of the large defect, preserve normal anatomy, preserve normal anatomical and functional relationships and enhance both functionality and cosmetic results   Undermining: area extensively undermined   Undermining comment:  Undermining defect 1.4 Subcutaneous layers (deep stitches):  Suture size:  3-0 Suture type: Vicryl (polyglactin 910)   Subcutaneous suture technique: inverted dermal. Fine/surface layer approximation (top stitches):  Suture size:  3-0 Suture type: nylon   Stitches: simple running   Suture removal (days):  7 Hemostasis achieved with: suture and pressure Outcome: patient tolerated procedure well with no complications   Post-procedure details: sterile dressing applied and wound care instructions given   Dressing type: bandage and pressure dressing (mupirocin)    mupirocin ointment (BACTROBAN) 2 % Apply 1 Application topically daily. With dressing changes  cephALEXin (KEFLEX) 500 MG capsule Take 1 capsule (500 mg total) by mouth 2 (two) times daily for 7 days.  Specimen 1 - Surgical pathology Differential Diagnosis: Biopsy proven recurrent  SCC Check Margins: Yes [phone] Tagged with suture at proximal 12:00 edge  Start Cephalexin '500mg'$  1 po bid for 7 days   Return in about 1 week (around 02/11/2022) for suture removal.  I, Ashok Cordia, CMA, am acting as scribe for [name], MD . Documentation: I have reviewed the above documentation for accuracy and completeness, and I agree with the above.  [name], MD

## 2022-02-04 NOTE — Telephone Encounter (Signed)
Spoke with patient regarding surgery. She is doing fine/hd 

## 2022-02-04 NOTE — Patient Instructions (Signed)

## 2022-02-06 ENCOUNTER — Encounter: Payer: Self-pay | Admitting: Dermatology

## 2022-02-13 ENCOUNTER — Ambulatory Visit (INDEPENDENT_AMBULATORY_CARE_PROVIDER_SITE_OTHER): Payer: Medicare Other | Admitting: Dermatology

## 2022-02-13 DIAGNOSIS — C4492 Squamous cell carcinoma of skin, unspecified: Secondary | ICD-10-CM

## 2022-02-13 DIAGNOSIS — C44622 Squamous cell carcinoma of skin of right upper limb, including shoulder: Secondary | ICD-10-CM

## 2022-02-13 NOTE — Patient Instructions (Signed)
Due to recent changes in healthcare laws, you may see results of your pathology and/or laboratory studies on MyChart before the doctors have had a chance to review them. We understand that in some cases there may be results that are confusing or concerning to you. Please understand that not all results are received at the same time and often the doctors may need to interpret multiple results in order to provide you with the best plan of care or course of treatment. Therefore, we ask that you please give us 2 business days to thoroughly review all your results before contacting the office for clarification. Should we see a critical lab result, you will be contacted sooner.   If You Need Anything After Your Visit  If you have any questions or concerns for your doctor, please call our main line at [phone] and press option 4 to reach your doctor's medical assistant. If no one answers, please leave a voicemail as directed and we will return your call as soon as possible. Messages left after 4 pm will be answered the following business day.   You may also send us a message via MyChart. We typically respond to MyChart messages within 1-2 business days.  For prescription refills, please ask your pharmacy to contact our office. Our fax number is [phone].  If you have an urgent issue when the clinic is closed that cannot wait until the next business day, you can page your doctor at the number below.    Please note that while we do our best to be available for urgent issues outside of office hours, we are not available 24/7.   If you have an urgent issue and are unable to reach us, you may choose to seek medical care at your doctor's office, retail clinic, urgent care center, or emergency room.  If you have a medical emergency, please immediately call 911 or go to the emergency department.  Pager Numbers  - Dr. Kowalski: [phone]  - Dr. Moye: [phone]  - Dr. Stewart:  [phone]  In the event of inclement weather, please call our main line at [phone] for an update on the status of any delays or closures.  Dermatology Medication Tips: Please keep the boxes that topical medications come in in order to help keep track of the instructions about where and how to use these. Pharmacies typically print the medication instructions only on the boxes and not directly on the medication tubes.   If your medication is too expensive, please contact our office at [phone] option 4 or send us a message through MyChart.   We are unable to tell what your co-pay for medications will be in advance as this is different depending on your insurance coverage. However, we may be able to find a substitute medication at lower cost or fill out paperwork to get insurance to cover a needed medication.   If a prior authorization is required to get your medication covered by your insurance company, please allow us 1-2 business days to complete this process.  Drug prices often vary depending on where the prescription is filled and some pharmacies may offer cheaper prices.  The website www.goodrx.com contains coupons for medications through different pharmacies. The prices here do not account for what the cost may be with help from insurance (it may be cheaper with your insurance), but the website can give you the price if you did not use any insurance.  - You can print the associated coupon and take it with   your prescription to the pharmacy.  - You may also stop by our office during regular business hours and pick up a GoodRx coupon card.  - If you need your prescription sent electronically to a different pharmacy, notify our office through [place] MyChart or by phone at [phone] option 4.     Si Usted Necesita Algo Despus de Su Visita  Tambin puede enviarnos un mensaje a travs de MyChart. Por lo general respondemos a los mensajes de MyChart en el transcurso de 1 a 2  das hbiles.  Para renovar recetas, por favor pida a su farmacia que se ponga en contacto con nuestra oficina. Nuestro nmero de fax es el [phone].  Si tiene un asunto urgente cuando la clnica est cerrada y que no puede esperar hasta el siguiente da hbil, puede llamar/localizar a su doctor(a) al nmero que aparece a continuacin.   Por favor, tenga en cuenta que aunque hacemos todo lo posible para estar disponibles para asuntos urgentes fuera del horario de oficina, no estamos disponibles las 24 horas del da, los 7 das de la semana.   Si tiene un problema urgente y no puede comunicarse con nosotros, puede optar por buscar atencin mdica  en el consultorio de su doctor(a), en una clnica privada, en un centro de atencin urgente o en una sala de emergencias.  Si tiene una emergencia mdica, por favor llame inmediatamente al 911 o vaya a la sala de emergencias.  Nmeros de bper  - Dr. Kowalski: [phone]  - Dra. Moye: [phone]  - Dra. Stewart: [phone]  En caso de inclemencias del tiempo, por favor llame a nuestra lnea principal al [phone] para una actualizacin sobre el estado de cualquier retraso o cierre.  Consejos para la medicacin en dermatologa: Por favor, guarde las cajas en las que vienen los medicamentos de uso tpico para ayudarle a seguir las instrucciones sobre dnde y cmo usarlos. Las farmacias generalmente imprimen las instrucciones del medicamento slo en las cajas y no directamente en los tubos del medicamento.   Si su medicamento es muy caro, por favor, pngase en contacto con nuestra oficina llamando al [phone] y presione la opcin 4 o envenos un mensaje a travs de MyChart.   No podemos decirle cul ser su copago por los medicamentos por adelantado ya que esto es diferente dependiendo de la cobertura de su seguro. Sin embargo, es posible que podamos encontrar un medicamento sustituto a menor costo o llenar un formulario para que el  seguro cubra el medicamento que se considera necesario.   Si se requiere una autorizacin previa para que su compaa de seguros cubra su medicamento, por favor permtanos de 1 a 2 das hbiles para completar este proceso.  Los precios de los medicamentos varan con frecuencia dependiendo del lugar de dnde se surte la receta y alguna farmacias pueden ofrecer precios ms baratos.  El sitio web www.goodrx.com tiene cupones para medicamentos de diferentes farmacias. Los precios aqu no tienen en cuenta lo que podra costar con la ayuda del seguro (puede ser ms barato con su seguro), pero el sitio web puede darle el precio si no utiliz ningn seguro.  - Puede imprimir el cupn correspondiente y llevarlo con su receta a la farmacia.  - Tambin puede pasar por nuestra oficina durante el horario de atencin regular y recoger una tarjeta de cupones de GoodRx.  - Si necesita que su receta se enve electrnicamente a una farmacia diferente, informe a nuestra oficina a travs de MyChart de [place]   o por telfono llamando al [phone] y presione la opcin 4.  

## 2022-02-13 NOTE — Progress Notes (Signed)
   Follow-Up Visit   Subjective  Molly Hodges or Molly Hodges is a 88 y.o. female who presents for the following: Follow-up (9 day f/u suture removal biopsy proven SCC excision at the right dorsum hand).   The following portions of the chart were reviewed this encounter and updated as appropriate:   Tobacco  Allergies  Meds  Problems  Med Hx  Surg Hx  Fam Hx      Review of Systems:  No other skin or systemic complaints except as noted in HPI or Assessment and Plan.  Objective  Well appearing patient in no apparent distress; mood and affect are within normal limits.  A focused examination was performed including right hand. Relevant physical exam findings are noted in the Assessment and Plan.  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  Related Medications mupirocin ointment (BACTROBAN) 2 % Apply 1 Application topically daily. With dressing changes   Return in about 6 days (around 02/19/2022) for suture removal.  I, Molly Hodges, CMA, am acting as scribe for Forest Gleason, MD .   Documentation: I have reviewed the above documentation for accuracy and completeness, and I agree with the above.  Forest Gleason, MD

## 2022-02-18 ENCOUNTER — Encounter: Payer: Self-pay | Admitting: Dermatology

## 2022-02-19 ENCOUNTER — Ambulatory Visit (INDEPENDENT_AMBULATORY_CARE_PROVIDER_SITE_OTHER): Payer: Medicare Other

## 2022-02-19 ENCOUNTER — Ambulatory Visit (INDEPENDENT_AMBULATORY_CARE_PROVIDER_SITE_OTHER): Payer: Medicare Other | Admitting: Dermatology

## 2022-02-19 ENCOUNTER — Telehealth: Payer: Self-pay | Admitting: Cardiovascular Disease

## 2022-02-19 DIAGNOSIS — I050 Rheumatic mitral stenosis: Secondary | ICD-10-CM

## 2022-02-19 DIAGNOSIS — T8130XA Disruption of wound, unspecified, initial encounter: Secondary | ICD-10-CM

## 2022-02-19 DIAGNOSIS — L089 Local infection of the skin and subcutaneous tissue, unspecified: Secondary | ICD-10-CM

## 2022-02-19 DIAGNOSIS — I480 Paroxysmal atrial fibrillation: Secondary | ICD-10-CM | POA: Diagnosis not present

## 2022-02-19 DIAGNOSIS — Z5181 Encounter for therapeutic drug level monitoring: Secondary | ICD-10-CM | POA: Diagnosis not present

## 2022-02-19 LAB — POCT INR: INR: 1.9 — AB (ref 2.0–3.0)

## 2022-02-19 MED ORDER — CEPHALEXIN 500 MG PO CAPS: ORAL_CAPSULE | ORAL | 0 refills | Status: DC

## 2022-02-19 NOTE — Progress Notes (Addendum)
   Follow-Up Visit   Subjective  Molly Hodges or Molly Hodges is a 88 y.o. female who presents for the following: Suture / Staple Removal. 1 week f/u suture removal biopsy proven SCC excision 02/04/2022.   Daughter with patient  The following portions of the chart were reviewed this encounter and updated as appropriate:   Tobacco  Allergies  Meds  Problems  Med Hx  Surg Hx  Fam Hx      Review of Systems:  No other skin or systemic complaints except as noted in HPI or Assessment and Plan.  Objective  Well appearing patient in no apparent distress; mood and affect are within normal limits.   A focused examination was performed including right dorsum hand. Relevant physical exam findings are noted in the Assessment and Plan. right dorsum hand Open wound at center of excision site with sides intact, some tenderness to palpation, with purulent drainage today.     Assessment & Plan  Local infection of skin and subcutaneous tissue right dorsum hand  s/p excision of SCC with center left to heal by secondary intention now with signs of infection Start Cephalexin 500 mg take 1 tablet tid x 5 days (defer doxycycline due to coumadin use; will await culture results and consider change in antibiotic therapy if indicated) Will leave sutures one more week for additional support in this high tension area.  Related Procedures Anaerobic and Aerobic Culture  Related Medications cephALEXin (KEFLEX) 500 MG capsule Take 1 tablet tid x 5 days   Return in about 1 week (around 02/26/2022) for suture removal .  I, Marye Round, CMA, am acting as scribe for Forest Gleason, MD .   Documentation: I have reviewed the above documentation for accuracy and completeness, and I agree with the above.  Forest Gleason, MD

## 2022-02-19 NOTE — Patient Instructions (Signed)
Due to recent changes in healthcare laws, you may see results of your pathology and/or laboratory studies on MyChart before the doctors have had a chance to review them. We understand that in some cases there may be results that are confusing or concerning to you. Please understand that not all results are received at the same time and often the doctors may need to interpret multiple results in order to provide you with the best plan of care or course of treatment. Therefore, we ask that you please give [place] 2 business days to thoroughly review all your results before contacting the office for clarification. Should we see a critical lab result, you will be contacted sooner.   If You Need Anything After Your Visit  If you have any questions or concerns for your doctor, please call our main line at [phone] and press option 4 to reach your doctor's medical assistant. If no one answers, please leave a voicemail as directed and we will return your call as soon as possible. Messages left after 4 pm will be answered the following business day.   You may also send [place] a message via [place]. We typically respond to MyChart messages within 1-2 business days.  For prescription refills, please ask your pharmacy to contact our office. Our fax number is [phone].  If you have an urgent issue when the clinic is closed that cannot wait until the next business day, you can page your doctor at the number below.    Please note that while we do our best to be available for urgent issues outside of office hours, we are not available 24/7.   If you have an urgent issue and are unable to reach [place], you may choose to seek medical care at your doctor's office, retail clinic, urgent care center, or emergency room.  If you have a medical emergency, please immediately call 911 or go to the emergency department.  Pager Numbers   - Dr. Laurence Ferrari: [phone]   In the event of inclement weather, please call our main line  at [phone] for an update on the status of any delays or closures.  Dermatology Medication Tips: Please keep the boxes that topical medications come in in order to help keep track of the instructions about where and how to use these. Pharmacies typically print the medication instructions only on the boxes and not directly on the medication tubes.   If your medication is too expensive, please contact our office at [phone] option 4 or send [place] a message through [place].   We are unable to tell what your co-pay for medications will be in advance as this is different depending on your insurance coverage. However, we may be able to find a substitute medication at lower cost or fill out paperwork to get insurance to cover a needed medication.   If a prior authorization is required to get your medication covered by your insurance company, please allow [place] 1-2 business days to complete this process.  Drug prices often vary depending on where the prescription is filled and some pharmacies may offer cheaper prices.  The website www.goodrx.com contains coupons for medications through different pharmacies. The prices here do not account for what the cost may be with help from insurance (it may be cheaper with your insurance), but the website can give you the price if you did not use any insurance.  - You can print the associated coupon and take it with your prescription to the pharmacy.  - You  may also stop by our office during regular business hours and pick up a GoodRx coupon card.  - If you need your prescription sent electronically to a different pharmacy, notify our office through [facility] or by phone at [phone] option 4.     Si Usted Necesita Algo Despus de Su Visita  Tambin puede enviarnos un mensaje a travs de Pharmacist, community. Por lo general respondemos a los mensajes de MyChart en el transcurso de 1 a 2 das hbiles.  Para renovar recetas, por favor pida a su farmacia que se  ponga en contacto con nuestra oficina. Harland Dingwall de fax es [place] [phone].  Si tiene un asunto urgente cuando la clnica est cerrada y que no puede esperar hasta el siguiente da hbil, puede llamar/localizar a su doctor(a) al nmero que aparece a continuacin.   Por favor, tenga en cuenta que aunque hacemos todo lo posible para estar disponibles para asuntos urgentes fuera del horario de [place], no estamos disponibles las 24 horas del da, los 7 das de la [place].   Si tiene un problema urgente y no puede comunicarse con nosotros, puede optar por buscar atencin mdica  en el consultorio de su doctor(a), en una clnica privada, en un centro de atencin urgente o en una sala de emergencias.  Si tiene Engineering geologist, por favor llame inmediatamente al 911 o vaya a la sala de emergencias.  Nmeros de bper  - Dra. Moye: [phone]   En caso de inclemencias del tiempo, por favor llame a nuestra lnea principal al [phone] para una actualizacin sobre el [place] de cualquier retraso o cierre.  Consejos para la medicacin en dermatologa: Por favor, guarde las cajas en las que vienen los medicamentos de uso tpico para ayudarle a seguir las instrucciones sobre dnde y cmo usarlos. Las farmacias generalmente imprimen las instrucciones del medicamento slo en las cajas y no directamente en los tubos del [place].   Si su medicamento es muy caro, por favor, pngase en contacto con Zigmund Daniel llamando al [phone] y presione la opcin 4 o envenos un mensaje a travs de Pharmacist, community.   No podemos decirle cul ser su copago por los medicamentos por adelantado ya que esto es diferente dependiendo de la cobertura de su seguro. Sin embargo, es posible que podamos encontrar un medicamento sustituto a Electrical engineer un formulario para que el seguro cubra el medicamento que se considera necesario.   Si se requiere una autorizacin previa para que su compaa de seguros [place]  su medicamento, por favor permtanos de 1 a 2 das hbiles para completar este proceso.  Los precios de los medicamentos varan con frecuencia dependiendo del Environmental consultant de dnde se surte la receta y alguna farmacias pueden ofrecer precios ms baratos.  El sitio web www.goodrx.com tiene cupones para medicamentos de Airline pilot. Los precios aqu no tienen en cuenta lo que podra costar con la ayuda del seguro (puede ser ms barato con su seguro), pero el sitio web puede darle el precio si no utiliz Research scientist (physical sciences).  - Puede imprimir el cupn correspondiente y llevarlo con su receta a la farmacia.  - Tambin puede pasar por nuestra oficina durante el horario de atencin regular y Charity fundraiser una tarjeta de cupones de GoodRx.  - Si necesita que su receta se enve electrnicamente a una farmacia diferente, informe a nuestra oficina a travs de MyChart de [place] o por telfono llamando al [phone] y presione la opcin 4.

## 2022-02-19 NOTE — Patient Instructions (Signed)
-  Starting Antibiotic 7/5, Eat greens every other day - Continue 1 tablet every day, EXCEPT 1.5 ON WEDNESDAY - Recheck INR in 2 weeks

## 2022-02-19 NOTE — Telephone Encounter (Signed)
Pt c/o BP issue: STAT if pt c/o blurred vision, one-sided weakness or slurred speech  1. What are your last 5 BP readings? 97/54, 110/45, 99/54, 121/54  2. Are you having any other symptoms (ex. Dizziness, headache, blurred vision, passed out)? no  3. What is your BP issue? Concerns of bp up & down.  Patient here for coumadin appt with concerns please call.

## 2022-02-20 NOTE — Telephone Encounter (Signed)
Called patient.   Patient reports that the lower blood pressures are from first thing in the morning, and she will feel a little lightheaded, but then her BP will come up throughout the day.   Patient asked if she should be doing anything other than staying well hydrated and maybe using salt on her foods.   Advised patient that as she has a low blood pressure first thing in the morning, she should try to get a full glass or bottle of water in prior to getting out of bed,and then to make sure she changes positions slowly first thing.   Advised patient that should her blood pressure not come up throughout the day and she remained symptomatic, or if she passes out to call our office.   Pt verbalized understanding and voiced appreciation for the call.

## 2022-02-24 LAB — ANAEROBIC AND AEROBIC CULTURE

## 2022-02-25 ENCOUNTER — Other Ambulatory Visit: Payer: Self-pay | Admitting: Dermatology

## 2022-02-25 ENCOUNTER — Encounter: Payer: Self-pay | Admitting: Dermatology

## 2022-02-25 MED ORDER — SILVER SULFADIAZINE 1 % EX CREA: 1.0000 | TOPICAL_CREAM | Freq: Two times a day (BID) | CUTANEOUS | 1 refills | Status: DC

## 2022-02-25 NOTE — Progress Notes (Signed)
Sent rx for Silvadene cream

## 2022-02-26 ENCOUNTER — Ambulatory Visit: Payer: Medicare Other | Admitting: Dermatology

## 2022-02-26 DIAGNOSIS — L570 Actinic keratosis: Secondary | ICD-10-CM | POA: Diagnosis not present

## 2022-02-26 DIAGNOSIS — S61401A Unspecified open wound of right hand, initial encounter: Secondary | ICD-10-CM

## 2022-02-26 DIAGNOSIS — B952 Enterococcus as the cause of diseases classified elsewhere: Secondary | ICD-10-CM

## 2022-02-26 DIAGNOSIS — Z4802 Encounter for removal of sutures: Secondary | ICD-10-CM

## 2022-02-26 DIAGNOSIS — B965 Pseudomonas (aeruginosa) (mallei) (pseudomallei) as the cause of diseases classified elsewhere: Secondary | ICD-10-CM

## 2022-02-26 DIAGNOSIS — L821 Other seborrheic keratosis: Secondary | ICD-10-CM

## 2022-02-26 NOTE — Patient Instructions (Addendum)
For Wound at Right Hand  Continue to apply prescribed silvadene cream and thin layer of vaseline followed by bandage  at healing wound at right dorsum hand for another 2 weeks. Please call if you notice any redness and tenderness in area before next follow up.    Actinic keratoses are precancerous spots that appear secondary to cumulative UV radiation exposure/sun exposure over time. They are chronic with expected duration over 1 year. A portion of actinic keratoses will progress to squamous cell carcinoma of the skin. It is not possible to reliably predict which spots will progress to skin cancer and so treatment is recommended to prevent development of skin cancer.  Recommend daily broad spectrum sunscreen SPF 30+ to sun-exposed areas, reapply every 2 hours as needed.  Recommend staying in the shade or wearing long sleeves, sun glasses (UVA+UVB protection) and wide brim hats (4-inch brim around the entire circumference of the hat). Call for new or changing lesions.   Cryotherapy Aftercare  Wash gently with soap and water everyday.   Apply Vaseline and Band-Aid daily until healed.      Due to recent changes in healthcare laws, you may see results of your pathology and/or laboratory studies on MyChart before the doctors have had a chance to review them. We understand that in some cases there may be results that are confusing or concerning to you. Please understand that not all results are received at the same time and often the doctors may need to interpret multiple results in order to provide you with the best plan of care or course of treatment. Therefore, we ask that you please give [place] 2 business days to thoroughly review all your results before contacting the office for clarification. Should we see a critical lab result, you will be contacted sooner.   If You Need Anything After Your Visit  If you have any questions or concerns for your doctor, please call our main line at [phone]  and press option 4 to reach your doctor's medical assistant. If no one answers, please leave a voicemail as directed and we will return your call as soon as possible. Messages left after 4 pm will be answered the following business day.   You may also send [place] a message via [place]. We typically respond to MyChart messages within 1-2 business days.  For prescription refills, please ask your pharmacy to contact our office. Our fax number is [phone].  If you have an urgent issue when the clinic is closed that cannot wait until the next business day, you can page your doctor at the number below.    Please note that while we do our best to be available for urgent issues outside of office hours, we are not available 24/7.   If you have an urgent issue and are unable to reach [place], you may choose to seek medical care at your doctor's office, retail clinic, urgent care center, or emergency room.  If you have a medical emergency, please immediately call 911 or go to the emergency department.  Pager Numbers  - Dr. Nehemiah Massed: [phone]  - Dr. Laurence Ferrari: [phone]  - Dr. Nicole Kindred: [phone]  In the event of inclement weather, please call our main line at [phone] for an update on the status of any delays or closures.  Dermatology Medication Tips: Please keep the boxes that topical medications come in in order to help keep track of the instructions about where and how to use these. Pharmacies typically print the medication instructions only  on the boxes and not directly on the medication tubes.   If your medication is too expensive, please contact our office at [phone] option 4 or send [place] a message through [place].   We are unable to tell what your co-pay for medications will be in advance as this is different depending on your insurance coverage. However, we may be able to find a substitute medication at lower cost or fill out paperwork to get insurance to cover a needed medication.    If a prior authorization is required to get your medication covered by your insurance company, please allow [place] 1-2 business days to complete this process.  Drug prices often vary depending on where the prescription is filled and some pharmacies may offer cheaper prices.  The website www.goodrx.com contains coupons for medications through different pharmacies. The prices here do not account for what the cost may be with help from insurance (it may be cheaper with your insurance), but the website can give you the price if you did not use any insurance.  - You can print the associated coupon and take it with your prescription to the pharmacy.  - You may also stop by our office during regular business hours and pick up a GoodRx coupon card.  - If you need your prescription sent electronically to a different pharmacy, notify our office through [facility] or by phone at [phone] option 4.     Si Usted Necesita Algo Despus de Su Visita  Tambin puede enviarnos un mensaje a travs de Pharmacist, community. Por lo general respondemos a los mensajes de MyChart en el transcurso de 1 a 2 das hbiles.  Para renovar recetas, por favor pida a su farmacia que se ponga en contacto con nuestra oficina. Harland Dingwall de fax es [place] [phone].  Si tiene un asunto urgente cuando la clnica est cerrada y que no puede esperar hasta el siguiente da hbil, puede llamar/localizar a su doctor(a) al nmero que aparece a continuacin.   Por favor, tenga en cuenta que aunque hacemos todo lo posible para estar disponibles para asuntos urgentes fuera del horario de [place], no estamos disponibles las 24 horas del da, los 7 das de la [place].   Si tiene un problema urgente y no puede comunicarse con nosotros, puede optar por buscar atencin mdica  en el consultorio de su doctor(a), en una clnica privada, en un centro de atencin urgente o en una sala de emergencias.  Si tiene Engineering geologist, por favor  llame inmediatamente al 911 o vaya a la sala de emergencias.  Nmeros de bper  - Dr. Nehemiah Massed: [phone]  - Dra. Moye: [phone]  - Dra. Nicole Kindred: [phone]  En caso de inclemencias del [place], por favor llame a Johnsie Kindred principal al [phone] para una actualizacin sobre el [place] de cualquier retraso o cierre.  Consejos para la medicacin en dermatologa: Por favor, guarde las cajas en las que vienen los medicamentos de uso tpico para ayudarle a seguir las instrucciones sobre dnde y cmo usarlos. Las farmacias generalmente imprimen las instrucciones del medicamento slo en las cajas y no directamente en los tubos del [place].   Si su medicamento es muy caro, por favor, pngase en contacto con Zigmund Daniel llamando al [phone] y presione la opcin 4 o envenos un mensaje a travs de Pharmacist, community.   No podemos decirle cul ser su copago por los medicamentos por adelantado ya que esto es diferente dependiendo de la cobertura de su seguro. Sin embargo, es posible  que podamos encontrar un medicamento sustituto a Electrical engineer un formulario para que el seguro cubra el medicamento que se considera necesario.   Si se requiere una autorizacin previa para que su compaa de seguros [place] su medicamento, por favor permtanos de 1 a 2 das hbiles para completar este proceso.  Los precios de los medicamentos varan con frecuencia dependiendo del Environmental consultant de dnde se surte la receta y alguna farmacias pueden ofrecer precios ms baratos.  El sitio web www.goodrx.com tiene cupones para medicamentos de Airline pilot. Los precios aqu no tienen en cuenta lo que podra costar con la ayuda del seguro (puede ser ms barato con su seguro), pero el sitio web puede darle el precio si no utiliz Research scientist (physical sciences).  - Puede imprimir el cupn correspondiente y llevarlo con su receta a la farmacia.  - Tambin puede pasar por nuestra oficina durante el horario de atencin regular y  Charity fundraiser una tarjeta de cupones de GoodRx.  - Si necesita que su receta se enve electrnicamente a una farmacia diferente, informe a nuestra oficina a travs de MyChart de [place] o por telfono llamando al [phone] y presione la opcin 4.

## 2022-02-26 NOTE — Progress Notes (Signed)
Follow-Up Visit   Subjective  Molly Hodges or Molly Hodges is a 88 y.o. female who presents for the following: Follow-up (Patient here today for follow on wound at right dorsum hand s/p excision of SCC with center left to heal by secondary intention. Treated with cephalexin 500 mg for 5 day course twice a day for infection at last visit. Patent has since completed. She reports her hand is doing well today.   The following portions of the chart were reviewed this encounter and updated as appropriate:  Tobacco  Allergies  Meds  Problems  Med Hx  Surg Hx  Fam Hx      Review of Systems: No other skin or systemic complaints except as noted in HPI or Assessment and Plan.   Objective  Well appearing patient in no apparent distress; mood and affect are within normal limits.  A focused examination was performed including right hand, arms. Relevant physical exam findings are noted in the Assessment and Plan.  right dorsal hand x 1 Erythematous thin papules/macules with gritty scale.    Assessment & Plan  Actinic keratosis right dorsal hand x 1  Hypertrophic ak   Actinic keratoses are precancerous spots that appear secondary to cumulative UV radiation exposure/sun exposure over time. They are chronic with expected duration over 1 year. A portion of actinic keratoses will progress to squamous cell carcinoma of the skin. It is not possible to reliably predict which spots will progress to skin cancer and so treatment is recommended to prevent development of skin cancer.  Recommend daily broad spectrum sunscreen SPF 30+ to sun-exposed areas, reapply every 2 hours as needed.  Recommend staying in the shade or wearing long sleeves, sun glasses (UVA+UVB protection) and wide brim hats (4-inch brim around the entire circumference of the hat). Call for new or changing lesions.  Destruction of lesion - right dorsal hand x 1  Destruction method: cryotherapy   Informed consent: discussed and  consent obtained   Lesion destroyed using liquid nitrogen: Yes   Cryotherapy cycles:  2 Outcome: patient tolerated procedure well with no complications   Post-procedure details: wound care instructions given   Additional details:  Prior to procedure, discussed risks of blister formation, small wound, skin dyspigmentation, or rare scar following cryotherapy. Recommend Vaseline ointment to treated areas while healing.    Seborrheic Keratoses - Stuck-on, waxy, tan-brown papules and/or plaques  - Benign-appearing - Discussed benign etiology and prognosis. - Observe - Call for any changes   Open wound - culture grew Pseudomonas and Enterococcus faecalis - Clinically does not look infected today - Start silver sulfadiazine cream twice a day to open wound until healed. Will defer PO antibiotics at this time. - Call or seek medical care for fevers, chills, increased drainage, increased pain or spreading redness from site  Encounter for Removal of Sutures - Incision site at the right dorsum hand  is clean, dry and intact - Wound cleansed, sutures removed, wound cleansed and steri strips applied.  - Discussed pathology results showing bx proven SCC  - Patient advised to keep steri-strips dry until they fall off. - Scars remodel for a full year. - Once steri-strips fall off, patient can apply over-the-counter silicone scar cream each night to help with scar remodeling if desired. - Patient advised to call with any concerns or if they notice any new or changing lesions.  Return for 2 week follow up wound recheck at right hand .  Garry Heater, CMA, am acting as  scribe for Forest Gleason, MD.  Documentation: I have reviewed the above documentation for accuracy and completeness, and I agree with the above.  Forest Gleason, MD

## 2022-03-05 ENCOUNTER — Ambulatory Visit (INDEPENDENT_AMBULATORY_CARE_PROVIDER_SITE_OTHER): Payer: Medicare Other

## 2022-03-05 ENCOUNTER — Encounter: Payer: Self-pay | Admitting: Dermatology

## 2022-03-05 DIAGNOSIS — Z5181 Encounter for therapeutic drug level monitoring: Secondary | ICD-10-CM

## 2022-03-05 DIAGNOSIS — I050 Rheumatic mitral stenosis: Secondary | ICD-10-CM

## 2022-03-05 DIAGNOSIS — I480 Paroxysmal atrial fibrillation: Secondary | ICD-10-CM

## 2022-03-05 LAB — POCT INR: INR: 1.5 — AB (ref 2.0–3.0)

## 2022-03-05 NOTE — Patient Instructions (Signed)
-   TAKE 2 TABLETS TODAY ONLY and then Continue 1 tablet every day, EXCEPT 1.5 ON WEDNESDAY - Recheck INR in 2 weeks

## 2022-03-05 NOTE — Addendum Note (Signed)
Addended by: Alfonso Patten on: 03/05/2022 01:45 PM   Modules accepted: Level of Service

## 2022-03-11 ENCOUNTER — Encounter: Payer: Self-pay | Admitting: Dermatology

## 2022-03-11 ENCOUNTER — Ambulatory Visit: Payer: Medicare Other | Admitting: Dermatology

## 2022-03-11 DIAGNOSIS — R234 Changes in skin texture: Secondary | ICD-10-CM

## 2022-03-11 DIAGNOSIS — S0081XA Abrasion of other part of head, initial encounter: Secondary | ICD-10-CM

## 2022-03-11 DIAGNOSIS — Z5189 Encounter for other specified aftercare: Secondary | ICD-10-CM | POA: Diagnosis not present

## 2022-03-11 NOTE — Patient Instructions (Signed)
Continue Silvadene and band-aid until healed.    Due to recent changes in healthcare laws, you may see results of your pathology and/or laboratory studies on MyChart before the doctors have had a chance to review them. We understand that in some cases there may be results that are confusing or concerning to you. Please understand that not all results are received at the same time and often the doctors may need to interpret multiple results in order to provide you with the best plan of care or course of treatment. Therefore, we ask that you please give [place] 2 business days to thoroughly review all your results before contacting the office for clarification. Should we see a critical lab result, you will be contacted sooner.   If You Need Anything After Your Visit  If you have any questions or concerns for your doctor, please call our main line at [phone] and press option 4 to reach your doctor's medical assistant. If no one answers, please leave a voicemail as directed and we will return your call as soon as possible. Messages left after 4 pm will be answered the following business day.   You may also send [place] a message via [place]. We typically respond to MyChart messages within 1-2 business days.  For prescription refills, please ask your pharmacy to contact our office. Our fax number is [phone].  If you have an urgent issue when the clinic is closed that cannot wait until the next business day, you can page your doctor at the number below.    Please note that while we do our best to be available for urgent issues outside of office hours, we are not available 24/7.   If you have an urgent issue and are unable to reach [place], you may choose to seek medical care at your doctor's office, retail clinic, urgent care center, or emergency room.  If you have a medical emergency, please immediately call 911 or go to the emergency department.  Pager Numbers  - Dr. Nehemiah Massed: [phone]  - Dr.  Laurence Ferrari: [phone]  - Dr. Nicole Kindred: [phone]  In the event of inclement weather, please call our main line at [phone] for an update on the status of any delays or closures.  Dermatology Medication Tips: Please keep the boxes that topical medications come in in order to help keep track of the instructions about where and how to use these. Pharmacies typically print the medication instructions only on the boxes and not directly on the medication tubes.   If your medication is too expensive, please contact our office at [phone] option 4 or send [place] a message through [place].   We are unable to tell what your co-pay for medications will be in advance as this is different depending on your insurance coverage. However, we may be able to find a substitute medication at lower cost or fill out paperwork to get insurance to cover a needed medication.   If a prior authorization is required to get your medication covered by your insurance company, please allow [place] 1-2 business days to complete this process.  Drug prices often vary depending on where the prescription is filled and some pharmacies may offer cheaper prices.  The website www.goodrx.com contains coupons for medications through different pharmacies. The prices here do not account for what the cost may be with help from insurance (it may be cheaper with your insurance), but the website can give you the price if you did not use any insurance.  - You  can print the associated coupon and take it with your prescription to the pharmacy.  - You may also stop by our office during regular business hours and pick up a GoodRx coupon card.  - If you need your prescription sent electronically to a different pharmacy, notify our office through [facility] or by phone at [phone] option 4.     Si Usted Necesita Algo Despus de Su Visita  Tambin puede enviarnos un mensaje a travs de Pharmacist, community. Por lo general respondemos a los mensajes  de MyChart en el transcurso de 1 a 2 das hbiles.  Para renovar recetas, por favor pida a su farmacia que se ponga en contacto con nuestra oficina. Harland Dingwall de fax es [place] [phone].  Si tiene un asunto urgente cuando la clnica est cerrada y que no puede esperar hasta el siguiente da hbil, puede llamar/localizar a su doctor(a) al nmero que aparece a continuacin.   Por favor, tenga en cuenta que aunque hacemos todo lo posible para estar disponibles para asuntos urgentes fuera del horario de [place], no estamos disponibles las 24 horas del da, los 7 das de la [place].   Si tiene un problema urgente y no puede comunicarse con nosotros, puede optar por buscar atencin mdica  en el consultorio de su doctor(a), en una clnica privada, en un centro de atencin urgente o en una sala de emergencias.  Si tiene Engineering geologist, por favor llame inmediatamente al 911 o vaya a la sala de emergencias.  Nmeros de bper  - Dr. Nehemiah Massed: [phone]  - Dra. Moye: [phone]  - Dra. Nicole Kindred: [phone]  En caso de inclemencias del [place], por favor llame a Johnsie Kindred principal al [phone] para una actualizacin sobre el [place] de cualquier retraso o cierre.  Consejos para la medicacin en dermatologa: Por favor, guarde las cajas en las que vienen los medicamentos de uso tpico para ayudarle a seguir las instrucciones sobre dnde y cmo usarlos. Las farmacias generalmente imprimen las instrucciones del medicamento slo en las cajas y no directamente en los tubos del [place].   Si su medicamento es muy caro, por favor, pngase en contacto con Zigmund Daniel llamando al [phone] y presione la opcin 4 o envenos un mensaje a travs de Pharmacist, community.   No podemos decirle cul ser su copago por los medicamentos por adelantado ya que esto es diferente dependiendo de la cobertura de su seguro. Sin embargo, es posible que podamos encontrar un medicamento sustituto a Actor un formulario para que el seguro cubra el medicamento que se considera necesario.   Si se requiere una autorizacin previa para que su compaa de seguros [place] su medicamento, por favor permtanos de 1 a 2 das hbiles para completar este proceso.  Los precios de los medicamentos varan con frecuencia dependiendo del Environmental consultant de dnde se surte la receta y alguna farmacias pueden ofrecer precios ms baratos.  El sitio web www.goodrx.com tiene cupones para medicamentos de Airline pilot. Los precios aqu no tienen en cuenta lo que podra costar con la ayuda del seguro (puede ser ms barato con su seguro), pero el sitio web puede darle el precio si no utiliz Research scientist (physical sciences).  - Puede imprimir el cupn correspondiente y llevarlo con su receta a la farmacia.  - Tambin puede pasar por nuestra oficina durante el horario de atencin regular y Charity fundraiser una tarjeta de cupones de GoodRx.  - Si necesita que su receta se enve electrnicamente a una farmacia diferente, informe a  nuestra oficina a travs de MyChart de [place] o por telfono llamando al [phone] y presione la opcin 4.

## 2022-03-11 NOTE — Progress Notes (Signed)
   Follow-Up Visit   Subjective  Molly Hodges or Molly Hodges is a 88 y.o. female who presents for the following: Wound Check (Patient here today for follow on wound at right dorsum hand s/p excision of SCC with center left to heal by secondary intention. Treated with cephalexin 500 mg for 5 day course twice a day for infection at last visit. Patent has since completed. She reports her hand is doing well today. Has been using Silvadene cream as directed. Thinks has suture showing in area).  The following portions of the chart were reviewed this encounter and updated as appropriate:  Tobacco  Allergies  Meds  Problems  Med Hx  Surg Hx  Fam Hx      Review of Systems: No other skin or systemic complaints except as noted in HPI or Assessment and Plan.   Objective  Well appearing patient in no apparent distress; mood and affect are within normal limits.  A focused examination was performed including right dorsal hand. Relevant physical exam findings are noted in the Assessment and Plan.  Right Hand - Posterior Normal appearing healing tissue on clinical exam today  Right Hand - Posterior Crust at LN2 site   Assessment & Plan    Encounter for wound re-check Right Hand - Posterior  Healing well at this stage.  Subq exposed/extruding suture snipped at area.  Continue Silvadene and band-aid until completely healed.   Blood crust on skin Right Hand - Posterior  Removed in office today. Site healing well.   Excoriation right cheek - Call if not resolved in a month or two  Return for Follow Up As Scheduled.  I, [name], CMA, am acting as scribe for Forest Gleason, MD.  Documentation: I have reviewed the above documentation for accuracy and completeness, and I agree with the above.  Forest Gleason, MD

## 2022-03-19 ENCOUNTER — Ambulatory Visit (INDEPENDENT_AMBULATORY_CARE_PROVIDER_SITE_OTHER): Payer: Medicare Other

## 2022-03-19 DIAGNOSIS — Z5181 Encounter for therapeutic drug level monitoring: Secondary | ICD-10-CM | POA: Diagnosis not present

## 2022-03-19 DIAGNOSIS — I480 Paroxysmal atrial fibrillation: Secondary | ICD-10-CM

## 2022-03-19 DIAGNOSIS — I050 Rheumatic mitral stenosis: Secondary | ICD-10-CM | POA: Diagnosis not present

## 2022-03-19 LAB — POCT INR: INR: 1.4 — AB (ref 2.0–3.0)

## 2022-03-19 NOTE — Patient Instructions (Signed)
-   TAKE 2 TABLETS TODAY ONLY and then INCREASE TO 1 tablet every day, EXCEPT 1.5 ON MONDAY, WEDNESDAY AND FRIDAY - Recheck INR in 2 weeks

## 2022-03-31 ENCOUNTER — Ambulatory Visit: Payer: Medicare Other | Admitting: Dermatology

## 2022-04-02 ENCOUNTER — Ambulatory Visit (INDEPENDENT_AMBULATORY_CARE_PROVIDER_SITE_OTHER): Payer: Medicare Other

## 2022-04-02 DIAGNOSIS — I050 Rheumatic mitral stenosis: Secondary | ICD-10-CM

## 2022-04-02 DIAGNOSIS — Z5181 Encounter for therapeutic drug level monitoring: Secondary | ICD-10-CM

## 2022-04-02 DIAGNOSIS — I480 Paroxysmal atrial fibrillation: Secondary | ICD-10-CM | POA: Diagnosis not present

## 2022-04-02 LAB — POCT INR: INR: 1.9 — AB (ref 2.0–3.0)

## 2022-04-02 NOTE — Patient Instructions (Signed)
-  TAKE 1.5 TABLETS TONIGHT -  and then INCREASE TO 1.5 tablets every day, EXCEPT 1 TABLET ON MONDAY, WEDNESDAY AND FRIDAY - Recheck INR in 3 weeks

## 2022-04-10 ENCOUNTER — Ambulatory Visit: Payer: Medicare Other | Admitting: Pulmonary Disease

## 2022-04-12 ENCOUNTER — Other Ambulatory Visit: Payer: Self-pay | Admitting: Cardiovascular Disease

## 2022-04-14 NOTE — Telephone Encounter (Signed)
Refill request

## 2022-04-22 DIAGNOSIS — H353132 Nonexudative age-related macular degeneration, bilateral, intermediate dry stage: Secondary | ICD-10-CM | POA: Diagnosis not present

## 2022-04-22 DIAGNOSIS — Z961 Presence of intraocular lens: Secondary | ICD-10-CM | POA: Diagnosis not present

## 2022-04-22 DIAGNOSIS — H401131 Primary open-angle glaucoma, bilateral, mild stage: Secondary | ICD-10-CM | POA: Diagnosis not present

## 2022-04-22 DIAGNOSIS — H43813 Vitreous degeneration, bilateral: Secondary | ICD-10-CM | POA: Diagnosis not present

## 2022-04-23 ENCOUNTER — Ambulatory Visit: Payer: Medicare Other | Attending: Internal Medicine

## 2022-04-23 DIAGNOSIS — Z5181 Encounter for therapeutic drug level monitoring: Secondary | ICD-10-CM | POA: Diagnosis not present

## 2022-04-23 DIAGNOSIS — I050 Rheumatic mitral stenosis: Secondary | ICD-10-CM

## 2022-04-23 DIAGNOSIS — I480 Paroxysmal atrial fibrillation: Secondary | ICD-10-CM | POA: Diagnosis not present

## 2022-04-23 LAB — POCT INR: INR: 2.1 (ref 2.0–3.0)

## 2022-04-23 NOTE — Patient Instructions (Signed)
Continue 1.5 tablets every day, EXCEPT 1 TABLET ON MONDAY, WEDNESDAY AND FRIDAY - Recheck INR in 5 weeks

## 2022-04-26 ENCOUNTER — Other Ambulatory Visit: Payer: Self-pay | Admitting: Student

## 2022-05-06 ENCOUNTER — Other Ambulatory Visit: Payer: Self-pay | Admitting: Family

## 2022-05-07 ENCOUNTER — Ambulatory Visit: Payer: Medicare Other | Admitting: Dermatology

## 2022-05-21 ENCOUNTER — Other Ambulatory Visit: Payer: Self-pay | Admitting: Cardiovascular Disease

## 2022-05-21 NOTE — Telephone Encounter (Signed)
Refill to pharmacy 

## 2022-05-28 ENCOUNTER — Ambulatory Visit: Payer: Medicare Other | Attending: Cardiovascular Disease

## 2022-05-28 DIAGNOSIS — I480 Paroxysmal atrial fibrillation: Secondary | ICD-10-CM | POA: Diagnosis not present

## 2022-05-28 DIAGNOSIS — Z5181 Encounter for therapeutic drug level monitoring: Secondary | ICD-10-CM

## 2022-05-28 DIAGNOSIS — I050 Rheumatic mitral stenosis: Secondary | ICD-10-CM

## 2022-05-28 LAB — POCT INR: INR: 3 (ref 2.0–3.0)

## 2022-05-28 NOTE — Patient Instructions (Signed)
Continue 1.5 tablets every day, EXCEPT 1 TABLET ON MONDAY, WEDNESDAY AND FRIDAY - Recheck INR in 6 weeks

## 2022-05-30 ENCOUNTER — Other Ambulatory Visit: Payer: Self-pay | Admitting: Cardiovascular Disease

## 2022-06-09 ENCOUNTER — Other Ambulatory Visit: Payer: Self-pay | Admitting: Family

## 2022-06-10 ENCOUNTER — Other Ambulatory Visit: Payer: Self-pay | Admitting: Cardiovascular Disease

## 2022-06-10 ENCOUNTER — Other Ambulatory Visit: Payer: Self-pay | Admitting: Family

## 2022-06-13 ENCOUNTER — Ambulatory Visit (INDEPENDENT_AMBULATORY_CARE_PROVIDER_SITE_OTHER): Payer: Medicare Other | Admitting: Family Medicine

## 2022-06-13 DIAGNOSIS — Z23 Encounter for immunization: Secondary | ICD-10-CM | POA: Diagnosis not present

## 2022-06-14 NOTE — Progress Notes (Signed)
Vaccine only, no E&M service

## 2022-06-16 ENCOUNTER — Encounter (INDEPENDENT_AMBULATORY_CARE_PROVIDER_SITE_OTHER): Payer: Self-pay

## 2022-06-16 ENCOUNTER — Other Ambulatory Visit: Payer: Self-pay | Admitting: Cardiovascular Disease

## 2022-06-16 DIAGNOSIS — E785 Hyperlipidemia, unspecified: Secondary | ICD-10-CM

## 2022-06-23 ENCOUNTER — Other Ambulatory Visit: Payer: Self-pay | Admitting: Cardiovascular Disease

## 2022-06-23 ENCOUNTER — Other Ambulatory Visit: Payer: Self-pay | Admitting: Family Medicine

## 2022-06-24 ENCOUNTER — Ambulatory Visit: Payer: Medicare Other | Admitting: Dermatology

## 2022-06-24 DIAGNOSIS — L820 Inflamed seborrheic keratosis: Secondary | ICD-10-CM | POA: Diagnosis not present

## 2022-06-24 DIAGNOSIS — L570 Actinic keratosis: Secondary | ICD-10-CM | POA: Diagnosis not present

## 2022-06-24 DIAGNOSIS — D0461 Carcinoma in situ of skin of right upper limb, including shoulder: Secondary | ICD-10-CM

## 2022-06-24 DIAGNOSIS — L578 Other skin changes due to chronic exposure to nonionizing radiation: Secondary | ICD-10-CM | POA: Diagnosis not present

## 2022-06-24 DIAGNOSIS — D485 Neoplasm of uncertain behavior of skin: Secondary | ICD-10-CM

## 2022-06-24 DIAGNOSIS — Z85828 Personal history of other malignant neoplasm of skin: Secondary | ICD-10-CM | POA: Diagnosis not present

## 2022-06-24 NOTE — Progress Notes (Signed)
Follow-Up Visit   Subjective  Molly Hodges or Molly Hodges is a 88 y.o. female who presents for the following: Actinic Keratosis (Of the hands - recheck for new or persistent precancerous skin lesions) and Irregular skin lesion (Of the R hand - patient is concerned and would like it checked today). The patient has spots, moles and lesions to be evaluated, some may be new or changing and the patient has concerns that these could be cancer.  The following portions of the chart were reviewed this encounter and updated as appropriate:   Tobacco  Allergies  Meds  Problems  Med Hx  Surg Hx  Fam Hx     Review of Systems:  No other skin or systemic complaints except as noted in HPI or Assessment and Plan.  Objective  Well appearing patient in no apparent distress; mood and affect are within normal limits.  A focused examination was performed including the face and hands. Relevant physical exam findings are noted in the Assessment and Plan.  R thumb webspace 0.7 cm hypertrophic papule.   R hand dorsum Erythematous stuck-on, waxy papule or plaque  B/L hand x 11 (11) Erythematous thin papules/macules with gritty scale.    Assessment & Plan  Neoplasm of uncertain behavior of skin R thumb webspace Epidermal / dermal shaving Lesion diameter (cm):  0.7 Informed consent: discussed and consent obtained   Timeout: patient name, date of birth, surgical site, and procedure verified   Procedure prep:  Patient was prepped and draped in usual sterile fashion Prep type:  Isopropyl alcohol Anesthesia: the lesion was anesthetized in a standard fashion   Anesthetic:  1% lidocaine w/ epinephrine 1-100,000 buffered w/ 8.4% NaHCO3 Instrument used: flexible razor blade   Hemostasis achieved with: pressure, aluminum chloride and electrodesiccation   Outcome: patient tolerated procedure well   Post-procedure details: sterile dressing applied and wound care instructions given   Dressing type: bandage  and petrolatum    Destruction of lesion Complexity: extensive   Destruction method: electrodesiccation and curettage   Informed consent: discussed and consent obtained   Timeout:  patient name, date of birth, surgical site, and procedure verified Procedure prep:  Patient was prepped and draped in usual sterile fashion Prep type:  Isopropyl alcohol Anesthesia: the lesion was anesthetized in a standard fashion   Anesthetic:  1% lidocaine w/ epinephrine 1-100,000 buffered w/ 8.4% NaHCO3 Curettage performed in three different directions: Yes   Electrodesiccation performed over the curetted area: Yes   Lesion length (cm):  0.7 Lesion width (cm):  0.7 Margin per side (cm):  0.3 Final wound size (cm):  1.3 Hemostasis achieved with:  pressure, aluminum chloride and electrodesiccation Outcome: patient tolerated procedure well with no complications   Post-procedure details: sterile dressing applied and wound care instructions given   Dressing type: bandage and petrolatum    Specimen 1 - Surgical pathology Differential Diagnosis: D48.5 r/o SCC vs other ED&C today  Check Margins: No  Inflamed seborrheic keratosis R hand dorsum Symptomatic, irritating, patient would like treated. Destruction of lesion - R hand dorsum Complexity: simple   Destruction method: cryotherapy   Informed consent: discussed and consent obtained   Timeout:  patient name, date of birth, surgical site, and procedure verified Lesion destroyed using liquid nitrogen: Yes   Region frozen until ice ball extended beyond lesion: Yes   Outcome: patient tolerated procedure well with no complications   Post-procedure details: wound care instructions given    AK (actinic keratosis) (11) B/L hand x  11 Destruction of lesion - B/L hand x 11 Complexity: simple   Destruction method: cryotherapy   Informed consent: discussed and consent obtained   Timeout:  patient name, date of birth, surgical site, and procedure  verified Lesion destroyed using liquid nitrogen: Yes   Region frozen until ice ball extended beyond lesion: Yes   Outcome: patient tolerated procedure well with no complications   Post-procedure details: wound care instructions given    Actinic Damage - chronic, secondary to cumulative UV radiation exposure/sun exposure over time - diffuse scaly erythematous macules with underlying dyspigmentation - Recommend daily broad spectrum sunscreen SPF 30+ to sun-exposed areas, reapply every 2 hours as needed.  - Recommend staying in the shade or wearing long sleeves, sun glasses (UVA+UVB protection) and wide brim hats (4-inch brim around the entire circumference of the hat). - Call for new or changing lesions.  History of Squamous Cell Carcinoma of the Skin - No evidence of recurrence today - No lymphadenopathy - Recommend regular full body skin exams - Recommend daily broad spectrum sunscreen SPF 30+ to sun-exposed areas, reapply every 2 hours as needed.  - Call if any new or changing lesions are noted between office visits  Return in about 4 months (around 10/23/2022).  Luther Redo, CMA, am acting as scribe for [name], MD . Documentation: I have reviewed the above documentation for accuracy and completeness, and I agree with the above.  [name], MD

## 2022-06-24 NOTE — Patient Instructions (Addendum)
Electrodesiccation and Curettage ("Scrape and Burn") Wound Care Instructions  Leave the original bandage on for 24 hours if possible.  If the bandage becomes soaked or soiled before that time, it is OK to remove it and examine the wound.  A small amount of post-operative bleeding is normal.  If excessive bleeding occurs, remove the bandage, place gauze over the site and apply continuous pressure (no peeking) over the area for 30 minutes. If this does not work, please call our clinic as soon as possible or page your doctor if it is after hours.   Once a day, cleanse the wound with soap and water. It is fine to shower. If a thick crust develops you may use a Q-tip dipped into dilute hydrogen peroxide (mix 1:1 with water) to dissolve it.  Hydrogen peroxide can slow the healing process, so use it only as needed.    After washing, apply petroleum jelly (Vaseline) or an antibiotic ointment if your doctor prescribed one for you, followed by a bandage.    For best healing, the wound should be covered with a layer of ointment at all times. If you are not able to keep the area covered with a bandage to hold the ointment in place, this may mean re-applying the ointment several times a day.  Continue this wound care until the wound has healed and is no longer open. It may take several weeks for the wound to heal and close.  Itching and mild discomfort is normal during the healing process.  If you have any discomfort, you can take Tylenol (acetaminophen) or ibuprofen as directed on the bottle. (Please do not take these if you have an allergy to them or cannot take them for another reason).  Some redness, tenderness and white or yellow material in the wound is normal healing.  If the area becomes very sore and red, or develops a thick yellow-green material (pus), it may be infected; please notify us.    Wound healing continues for up to one year following surgery. It is not unusual to experience pain in the scar  from time to time during the interval.  If the pain becomes severe or the scar thickens, you should notify the office.    A slight amount of redness in a scar is expected for the first six months.  After six months, the redness will fade and the scar will soften and fade.  The color difference becomes less noticeable with time.  If there are any problems, return for a post-op surgery check at your earliest convenience.  To improve the appearance of the scar, you can use silicone scar gel, cream, or sheets (such as Mederma or Serica) every night for up to one year. These are available over the counter (without a prescription).  Please call our office at [phone] for any questions or concerns.     Due to recent changes in healthcare laws, you may see results of your pathology and/or laboratory studies on MyChart before the doctors have had a chance to review them. We understand that in some cases there may be results that are confusing or concerning to you. Please understand that not all results are received at the same time and often the doctors may need to interpret multiple results in order to provide you with the best plan of care or course of treatment. Therefore, we ask that you please give us 2 business days to thoroughly review all your results before contacting the office for clarification. Should   we see a critical lab result, you will be contacted sooner.   If You Need Anything After Your Visit  If you have any questions or concerns for your doctor, please call our main line at [phone] and press option 4 to reach your doctor's medical assistant. If no one answers, please leave a voicemail as directed and we will return your call as soon as possible. Messages left after 4 pm will be answered the following business day.   You may also send us a message via MyChart. We typically respond to MyChart messages within 1-2 business days.  For prescription refills, please ask your  pharmacy to contact our office. Our fax number is [phone].  If you have an urgent issue when the clinic is closed that cannot wait until the next business day, you can page your doctor at the number below.    Please note that while we do our best to be available for urgent issues outside of office hours, we are not available 24/7.   If you have an urgent issue and are unable to reach us, you may choose to seek medical care at your doctor's office, retail clinic, urgent care center, or emergency room.  If you have a medical emergency, please immediately call 911 or go to the emergency department.  Pager Numbers  - Dr. Kowalski: [phone]  - Dr. Moye: [phone]  - Dr. Stewart: [phone]  In the event of inclement weather, please call our main line at [phone] for an update on the status of any delays or closures.  Dermatology Medication Tips: Please keep the boxes that topical medications come in in order to help keep track of the instructions about where and how to use these. Pharmacies typically print the medication instructions only on the boxes and not directly on the medication tubes.   If your medication is too expensive, please contact our office at [phone] option 4 or send us a message through MyChart.   We are unable to tell what your co-pay for medications will be in advance as this is different depending on your insurance coverage. However, we may be able to find a substitute medication at lower cost or fill out paperwork to get insurance to cover a needed medication.   If a prior authorization is required to get your medication covered by your insurance company, please allow us 1-2 business days to complete this process.  Drug prices often vary depending on where the prescription is filled and some pharmacies may offer cheaper prices.  The website www.goodrx.com contains coupons for medications through different pharmacies. The prices here do not  account for what the cost may be with help from insurance (it may be cheaper with your insurance), but the website can give you the price if you did not use any insurance.  - You can print the associated coupon and take it with your prescription to the pharmacy.  - You may also stop by our office during regular business hours and pick up a GoodRx coupon card.  - If you need your prescription sent electronically to a different pharmacy, notify our office through [facility] MyChart or by phone at [phone] option 4.     Si Usted Necesita Algo Despus de Su Visita  Tambin puede enviarnos un mensaje a travs de MyChart. Por lo general respondemos a los mensajes de MyChart en el transcurso de 1 a 2 das hbiles.  Para renovar recetas, por favor pida a su farmacia que se ponga en   contacto con nuestra oficina. Nuestro nmero de fax es el [phone].  Si tiene un asunto urgente cuando la clnica est cerrada y que no puede esperar hasta el siguiente da hbil, puede llamar/localizar a su doctor(a) al nmero que aparece a continuacin.   Por favor, tenga en cuenta que aunque hacemos todo lo posible para estar disponibles para asuntos urgentes fuera del horario de oficina, no estamos disponibles las 24 horas del da, los 7 das de la semana.   Si tiene un problema urgente y no puede comunicarse con nosotros, puede optar por buscar atencin mdica  en el consultorio de su doctor(a), en una clnica privada, en un centro de atencin urgente o en una sala de emergencias.  Si tiene una emergencia mdica, por favor llame inmediatamente al 911 o vaya a la sala de emergencias.  Nmeros de bper  - Dr. Kowalski: [phone]  - Dra. Moye: [phone]  - Dra. Stewart: [phone]  En caso de inclemencias del tiempo, por favor llame a nuestra lnea principal al [phone] para una actualizacin sobre el estado de cualquier retraso o cierre.  Consejos para la medicacin en dermatologa: Por  favor, guarde las cajas en las que vienen los medicamentos de uso tpico para ayudarle a seguir las instrucciones sobre dnde y cmo usarlos. Las farmacias generalmente imprimen las instrucciones del medicamento slo en las cajas y no directamente en los tubos del medicamento.   Si su medicamento es muy caro, por favor, pngase en contacto con nuestra oficina llamando al [phone] y presione la opcin 4 o envenos un mensaje a travs de MyChart.   No podemos decirle cul ser su copago por los medicamentos por adelantado ya que esto es diferente dependiendo de la cobertura de su seguro. Sin embargo, es posible que podamos encontrar un medicamento sustituto a menor costo o llenar un formulario para que el seguro cubra el medicamento que se considera necesario.   Si se requiere una autorizacin previa para que su compaa de seguros cubra su medicamento, por favor permtanos de 1 a 2 das hbiles para completar este proceso.  Los precios de los medicamentos varan con frecuencia dependiendo del lugar de dnde se surte la receta y alguna farmacias pueden ofrecer precios ms baratos.  El sitio web www.goodrx.com tiene cupones para medicamentos de diferentes farmacias. Los precios aqu no tienen en cuenta lo que podra costar con la ayuda del seguro (puede ser ms barato con su seguro), pero el sitio web puede darle el precio si no utiliz ningn seguro.  - Puede imprimir el cupn correspondiente y llevarlo con su receta a la farmacia.  - Tambin puede pasar por nuestra oficina durante el horario de atencin regular y recoger una tarjeta de cupones de GoodRx.  - Si necesita que su receta se enve electrnicamente a una farmacia diferente, informe a nuestra oficina a travs de MyChart de [facility] o por telfono llamando al [phone] y presione la opcin 4.  

## 2022-07-01 ENCOUNTER — Telehealth: Payer: Self-pay

## 2022-07-01 NOTE — Telephone Encounter (Signed)
Patient informed of pathology results 

## 2022-07-01 NOTE — Telephone Encounter (Signed)
-----   Message from Ralene Bathe, MD sent at 06/30/2022 12:58 PM EST ----- Diagnosis Skin , right thumb webspace SQUAMOUS CELL CARCINOMA IN SITU, ASSOCIATED WITH VERRUCA  Cancer - SCC in situ  Associated with viral wart Already treated Recheck next visit

## 2022-07-04 ENCOUNTER — Encounter: Payer: Self-pay | Admitting: Dermatology

## 2022-07-07 NOTE — Progress Notes (Unsigned)
Cardiology Office Note  Date:  07/09/2022   ID:  Molly, Hodges August 02, 1932, MRN [id number]  PCP:  Birdie Sons, MD   Chief Complaint  Patient presents with   6 month follow up     "Doing well." Medications reviewed by the patient verbally.     HPI:  Molly Hodges is a 88 y.o. female with  Paroxysmal Afib,  (CHADS2VASc of 4 - HTN, age x 2, female)  COPD, (never smoked) asthma, prior episodes of PNA,   hospital admission from 1/30 to 09/21/15,  HTN  Mitral valve stenosis, moderate December 2021 Pericardial effusion mild to moderate December 2021 PAD, carotid stenosis, high-grade on the right, stent placed 2021 Chronic dizziness 05/2020 started, last couple of weeks at a time, seen by ENT/neurology Coronary artery disease, catheterization December 2022 stent to mid LAD who presents  Follow-up for her atrial fibrillation,Previous cardioversion, carotid stenosis, CAD with PCI, mitral valve stenosis  Last seen by myself in clinic  5/23 Chronic dizziness, comes and goes Uses a walker at times, did not bring it with her today Gait instability, neuropathy in her feet She denies any significant shortness of breath on exertion  On prior clinic visit we held amiodarone, she reports that she continues to take this Heart rate low on EKG Has previously indicated she is asymptomatic in the atrial fibrillation Normal sinus rhythm October 2022 atrial fibrillation November 2022 Has remained in atrial fibrillation since that time Bronchitis/pneumonia January 2023 requiring oxygen yes  Has periodic follow-up with pulmonary, reports that she was released Remains on high-dose diuretic, torsemide 40 twice daily  no leg swelling, no recent lab work since June 2023  Recent imaging discussed 5/23 Right Carotid: Velocities in the right ICA are consistent with a 40-59% stenosis.  Left Carotid: Velocities in the left ICA are consistent with a 40-59%  stenosis.   EKG personally  reviewed by myself on todays visit Atrial fibrillation rate 51 bpm no ST or T changes  Other past medical hx reviewed Cath 08/02/21  Severe single-vessel coronary artery disease with 80-90% ostial through mid LAD and proximal/mid D1 stenoses, as seen on recent diagnostic catheterization. Successful OCT-guided PCI to ostial through mid LAD using Onyx Frontier 2.75 x 26 mm drug-eluting stent with 0% residual stenosis and TIMI-3 flow. Successful PCI to D1 using Onyx Frontier 2.5 x 22 mm drug-eluting stent with 0% residual stenosis and TIMI-3 flow.  Cath 08/07/2021 for chest pain/bronchitis   1st Diag-1 lesion is 20% stenosed.   Non-stenotic Ost LAD to Mid LAD lesion was previously treated.   Non-stenotic 1st Diag-2 lesion was previously treated.   1.  Widely patent LAD/diagonal stents with no significant restenosis.    In the ER 08/23/21 for chest pain, enz neg Treated for bronchitis  Echocardiogram July 25, 2020  EF 55 to 60%, mildly dilated left atrium, mild to moderate circumferential pericardial effusion, Mild to moderate mitral valve regurgitation moderate mitral valve stenosis gradient 10 mmHg Dilated IVC  Seen by Dr. Lucky Cowboy, episodes of dizziness High-grade right carotid artery stenosis estimated 80 to 99%  found to be in new onset Afib with RVR on 09/17/15 upon her arrival to [facility], pneumonia started on Eliquis given her stroke risk (CHADS2VASc of 4 - HTN, age x 2, female) and rate controlled.  Beta blockers were avoided given her asthma.  Started on thousand channel blocker    PMH:   has a past medical history of Aortic atherosclerosis ([place]), Asthma, CAD (coronary artery  disease), Carotid artery disease ([place]), CHF (congestive heart failure) ([place]), Community acquired pneumonia, COPD (chronic obstructive pulmonary disease) ([place]), Essential hypertension, Glaucoma, Hyperlipidemia, Mitral regurgitation, Mitral stenosis, Peripheral neuropathy, Persistent atrial fibrillation ([place])  (09/17/2015), Squamous cell carcinoma of skin (12/18/2021), Squamous cell carcinoma of skin (12/31/2021), Squamous cell carcinoma of skin (12/31/2021), and Squamous cell carcinoma of skin (06/24/2022).  PSH:    Past Surgical History:  Procedure Laterality Date   ABDOMINAL HYSTERECTOMY  1987   due to heavy bleeding   APPENDECTOMY     CARDIAC CATHETERIZATION     CAROTID PTA/STENT INTERVENTION Right 08/15/2020   Procedure: CAROTID PTA/STENT INTERVENTION;  Surgeon: Algernon Huxley, MD;  Location: [facility];  Service: Cardiovascular;  Laterality: Right;   CATARACT EXTRACTION     CORONARY STENT INTERVENTION N/A 08/02/2021   Procedure: CORONARY STENT INTERVENTION;  Surgeon: Nelva Bush, MD;  Location: [facility];  Service: Cardiovascular;  Laterality: N/A;   ELECTROPHYSIOLOGIC STUDY N/A 11/15/2015   Procedure: CARDIOVERSION;  Surgeon: Minna Merritts, MD;  Location: [facility];  Service: Cardiovascular;  Laterality: N/A;   INTRAVASCULAR IMAGING/OCT N/A 08/02/2021   Procedure: INTRAVASCULAR IMAGING/OCT;  Surgeon: Nelva Bush, MD;  Location: [facility];  Service: Cardiovascular;  Laterality: N/A;   LEFT HEART CATH AND CORONARY ANGIOGRAPHY N/A 08/02/2021   Procedure: LEFT HEART CATH AND CORONARY ANGIOGRAPHY;  Surgeon: Nelva Bush, MD;  Location: [facility];  Service: Cardiovascular;  Laterality: N/A;   LEFT HEART CATH AND CORONARY ANGIOGRAPHY N/A 08/07/2021   Procedure: LEFT HEART CATH AND CORONARY ANGIOGRAPHY;  Surgeon: Wellington Hampshire, MD;  Location: [facility];  Service: Cardiovascular;  Laterality: N/A;   RIGHT/LEFT HEART CATH AND CORONARY ANGIOGRAPHY N/A 05/17/2021   Procedure: RIGHT/LEFT HEART CATH AND CORONARY ANGIOGRAPHY;  Surgeon: Nelva Bush, MD;  Location: [facility];  Service: Cardiovascular;  Laterality: N/A;   TEE WITHOUT CARDIOVERSION N/A 06/13/2021   Procedure: TRANSESOPHAGEAL ECHOCARDIOGRAM (TEE);  Surgeon:  Minna Merritts, MD;  Location: [facility];  Service: Cardiovascular;  Laterality: N/A;    Current Outpatient Medications  Medication Sig Dispense Refill   amiodarone (PACERONE) 200 MG tablet Take 200 mg by mouth daily.     Calcium Carbonate-Vitamin D 600-400 MG-UNIT tablet Take 1 tablet by mouth daily.     cholecalciferol (VITAMIN D) 1000 UNITS tablet Take 1,000 Units by mouth daily.     clopidogrel (PLAVIX) 75 MG tablet Take 1 tablet (75 mg total) by mouth daily. 90 tablet 3   diltiazem (CARDIZEM CD) 180 MG 24 hr capsule TAKE 1 CAPSULE EVERY DAY 30 capsule 11   dorzolamide (TRUSOPT) 2 % ophthalmic solution Place 1 drop into both eyes 2 (two) times daily.      ezetimibe (ZETIA) 10 MG tablet TAKE 1 TABLET BY MOUTH DAILY. 90 tablet 0   hydrochlorothiazide (MICROZIDE) 12.5 MG capsule Take 12.5 mg by mouth daily.     ipratropium-albuterol (DUONEB) 0.5-2.5 (3) MG/3ML SOLN TAKE 3MLS BY NEBULIZATION EVERY 4 HOURS AS NEEDED 360 mL 1   isosorbide mononitrate (IMDUR) 30 MG 24 hr tablet TAKE 1 TABLET BY MOUTH DAILY 90 tablet 2   latanoprost (XALATAN) 0.005 % ophthalmic solution Place 1 drop into both eyes at bedtime.      levalbuterol (XOPENEX HFA) 45 MCG/ACT inhaler Inhale 2 puffs into the lungs every 6 (six) hours as needed for wheezing or shortness of breath. 1 each 3   magnesium oxide (MAG-OX) 400 MG tablet Take 400 mg  by mouth daily.     Multiple Vitamins-Minerals (PRESERVISION AREDS 2+MULTI VIT) CAPS Take 2 capsules by mouth daily.     mupirocin ointment (BACTROBAN) 2 % Apply 1 Application topically daily. With dressing changes 22 g 0   nitroGLYCERIN (NITROSTAT) 0.4 MG SL tablet Place 1 tablet (0.4 mg total) under the tongue every 5 (five) minutes as needed (for chest pain or shortness of breath). 100 tablet 3   potassium chloride (KLOR-CON) 10 MEQ tablet TAKE 2 TABLETS BY MOUTH DAILY. TAKE EXTRA 2 TABLETS WHEN TAKING EXTRA LASIX. 200 tablet 0   pregabalin (LYRICA) 50 MG capsule TAKE 1 CAPSULE BY  MOUTH 2 TIMES DAILY 60 capsule 5   rosuvastatin (CRESTOR) 20 MG tablet TAKE 1 TABLET BY MOUTH DAILY. 90 tablet 0   silver sulfADIAZINE (SILVADENE) 1 % cream Apply 1 Application topically 2 (two) times daily. 50 g 1   Tiotropium Bromide-Olodaterol (STIOLTO RESPIMAT) 2.5-2.5 MCG/ACT AERS Inhale 2 puffs into the lungs daily. 1 each 5   torsemide (DEMADEX) 20 MG tablet Take 2 tablets (40 mg total) by mouth 2 (two) times daily. MUST MAKE HF clinic appt for any further refills 120 tablet 0   vitamin C (ASCORBIC ACID) 500 MG tablet Take 500 mg by mouth daily.     vitamin E 100 UNIT capsule Take 200 Units by mouth daily.     warfarin (COUMADIN) 2.5 MG tablet TAKE 1 TABLET BY MOUTH DAILY AS DIRECTEDBY THE ANTI-COAG CLINIC 30 tablet 3   cephALEXin (KEFLEX) 500 MG capsule Take 1 tablet tid x 5 days (Patient not taking: Reported on 06/24/2022) 15 capsule 0   No current facility-administered medications for this visit.    Allergies:   Shellfish allergy, Azithromycin, Tamiflu [oseltamivir phosphate], and Albuterol   Social History:  The patient  reports that she has never smoked. She has never used smokeless tobacco. She reports that she does not drink alcohol and does not use drugs.   Family History:   family history includes CAD in her father and mother; Cancer (age of onset: 75) in her son.    Review of Systems: Review of Systems  Constitutional: Negative.   Respiratory: Negative.    Cardiovascular: Negative.   Gastrointestinal: Negative.   Musculoskeletal: Negative.   Neurological: Negative.   Psychiatric/Behavioral: Negative.    All other systems reviewed and are negative.   PHYSICAL EXAM: VS:  BP (!) 120/50 (BP Location: Left Arm, Patient Position: Sitting, Cuff Size: Normal)   Pulse (!) 51   Ht '5\' 5"'$  (1.651 m)   Wt 128 lb (58.1 kg)   SpO2 96%   BMI 21.30 kg/m  , BMI Body mass index is 21.3 kg/m. Constitutional:  oriented to person, place, and time. No distress.  HENT:  Head:  Grossly normal Eyes:  no discharge. No scleral icterus.  Neck: No JVD, no carotid bruits  Cardiovascular: Irregularly irregular, no murmurs appreciated Pulmonary/Chest: Clear to auscultation bilaterally, no wheezes or rails Abdominal: Soft.  no distension.  no tenderness.  Musculoskeletal: Normal range of motion Neurological:  normal muscle tone. Coordination normal. No atrophy Skin: Skin warm and dry Psychiatric: normal affect, pleasant  Recent Labs: 08/23/2021: ALT 64; Magnesium 2.1 01/26/2022: B Natriuretic Peptide 1,854.5; Hemoglobin 10.4; Platelets 209 02/03/2022: BUN 24; Creatinine, Ser 1.11; Potassium 4.5; Sodium 138    Lipid Panel Lab Results  Component Value Date   CHOL 100 08/07/2021   HDL 44 08/07/2021   LDLCALC 47 08/07/2021   TRIG 46 08/07/2021  Wt Readings from Last 3 Encounters:  07/09/22 128 lb (58.1 kg)  07/08/22 128 lb 12.8 oz (58.4 kg)  01/28/22 128 lb 8 oz (58.3 kg)     ASSESSMENT AND PLAN:  Paroxysmal atrial fibrillation ([place]) -  Atrial fibrillation noted on EKG November 2022, she is on warfarin,  Persistent since that time Previous discussions, was not interested in cardioversions, was asymptomatic high risk of recurrent arrhythmia given mitral valve disease, dilated left atrium Recommend she hold amiodarone especially in light of bradycardia  Shortness of breath/chronic diastolic CHF Bronchitis resolved, denies shortness of breath BMP ordered as she is on high-dose diuretic torsemide 40 twice daily  PAD/carotid stenosis Prior stenting,  On Plavix, followed by vascular  Coronary artery disease with stable angina Prior stenting to mid LAD end of 2022 Denies anginal symptoms, cholesterol at goal  Essential hypertension Blood pressure is well controlled on today's visit. No changes made to the medications.  Chronic dizziness Previously seen by vestibular PT She reports that nothing helps her symptoms which wax and wane  Mitral valve  stenosis Moderate mitral valve stenosis by TEE Also with mitral valve regurgitation Will need periodic echocardiogram  Centrilobular emphysema (HCC) Breathing much better after recent bronchitis, on diuretic  Hyperlipidemia Cholesterol at goal no changes made    Total encounter time more than 30 minutes  Greater than 50% was spent in counseling and coordination of care with the patient     Orders Placed This Encounter  Procedures   EKG 12-Lead      Signed, Esmond Plants, M.D., Ph.D. 07/09/2022  [place], [place]

## 2022-07-08 ENCOUNTER — Encounter: Payer: Self-pay | Admitting: Pulmonary Disease

## 2022-07-08 ENCOUNTER — Ambulatory Visit: Payer: Medicare Other | Admitting: Pulmonary Disease

## 2022-07-08 VITALS — BP 122/78 | HR 54 | Temp 97.0°F | Ht 65.0 in | Wt 128.8 lb

## 2022-07-08 DIAGNOSIS — I059 Rheumatic mitral valve disease, unspecified: Secondary | ICD-10-CM

## 2022-07-08 DIAGNOSIS — Z8709 Personal history of other diseases of the respiratory system: Secondary | ICD-10-CM

## 2022-07-08 DIAGNOSIS — R0602 Shortness of breath: Secondary | ICD-10-CM

## 2022-07-08 DIAGNOSIS — G4734 Idiopathic sleep related nonobstructive alveolar hypoventilation: Secondary | ICD-10-CM | POA: Diagnosis not present

## 2022-07-08 MED ORDER — LEVALBUTEROL TARTRATE 45 MCG/ACT IN AERO: 2.0000 | INHALATION_SPRAY | Freq: Four times a day (QID) | RESPIRATORY_TRACT | 3 refills | Status: DC | PRN

## 2022-07-08 NOTE — Progress Notes (Signed)
Subjective:    Patient ID: Molly Hodges, female    DOB: June 15, 1932, 88 y.o.   MRN: [id number] Patient Care Team: Birdie Sons, MD as PCP - General (Family Medicine) Rockey Situ Kathlene November, MD as PCP - Cardiology (Cardiology) Minna Merritts, MD as Consulting Physician (Cardiology) Leandrew Koyanagi, MD as Referring Physician (Ophthalmology) Lucky Cowboy Erskine Squibb, MD as Referring Physician (Vascular Surgery) Alfonso Patten, MD as Consulting Physician (Dermatology)  Chief Complaint  Patient presents with   Follow-up    Breathing is better. Hardly every has SOB. No wheezing or cough.    HPI Molly Hodges is an 88 year old lifelong never smoker who presents for follow-up on the issue of dyspnea.  I initially evaluated this patient on 20 November 2020, she was subsequently seen by one of our nurse practitioners, Rexene Edison, NP on 01 Jan 2021 and lost to follow-up after that.  Recall she had issues with mitral valve disease, coronary artery disease and was noted to have some pulmonary hypertension.  Nocturnal oximetry showed that the patient did have desaturations during sleep by oximetry and significant bradycardia.  She was placed on oxygen at 1.5 L/min.  However, in March 2023 the patient states that the oxygen concentrator be discontinued [place].  However, also in the interim, the patient has had intervention to the LAD and first diagonal in December 2022 and the patient has noted marked improvement on her shortness of breath since then.  She is not using her inhalers the exception of occasional rescue inhaler use.  She states today "I do not know what am I doing here".  Does not endorse any shortness of breath currently.  States after her interventions she feels much better.  She has not had any chest pain.  No orthopnea or paroxysmal nocturnal dyspnea.  Discontinued oxygen as noted above in May, does not note any change since discontinuing it.  No cough, sputum production,  hemoptysis, no wheezing.  No lower extremity edema or calf tenderness.  Overall she feels well and looks well.  DATA: 05/14/2020 chest x-ray: Independently reviewed : enlargement of the cardiac silhouette.  Interstitial thickening/edema with mild pulmonary vascular congestion.  Straightening of the left cardiac border consistent with mitral stenosis 07/27/2020  2D echo: LVEF 55 to 60%, no wall motion abnormalities on the left ventricle.  Left atrium dilated, moderate pericardial effusion thickened mitral valve leaflets mild to moderate mitral valve regurgitation, moderate mitral stenosis, right atrial pressure suggested to be 15 mmHg 11/20/2020 ambulatory (office) oximetry: No desaturations during ambulation, patient mentating saturations at 98% throughout the study 12/05/2020 overnight oximetry: Desaturations to 85% and significant bradycardia 12/13/2020 echocardiogram: LVEF 55 to 67%, grade 2 diastolic dysfunction, RV systolic function low normal left atrial size moderately enlarged, moderate to severe mitral stenosis, mild aortic regurgitation 05/17/2021 right/left heart cath: Single-vessel coronary artery disease, 80 to 90% proximal/mid LAD stenosis, mildly elevated right heart and left ventricular pressures, moderately to severely elevated pulmonary artery pressure, severe mitral valve stenosis with moderate mitral regurgitation, pulmonary hypertension likely on the basis of mitral disease 06/13/2021 TEE: LVEF 55 to 60%, RV function normal, RV size normal, moderate mitral valve regurgitation, moderate mitral stenosis.  No intra-atrial shunt.  Left atrial size moderately dilated 08/02/2021 left heart cath: Severe single-vessel coronary disease with 80 to 90% mid LAD and proximal mid D1 stenosis.  Two drug-eluting stents placed in mid LAD and D1 08/07/2021 left heart cath: D1 lesion 20% stenosis, widely patent LAD/diagonal stents with no significant stenosis,  mildly elevated left ventricular  end-diastolic pressure at 19 mmHg 01/26/2022 CTA chest: No PE, pulmonary edema, reactive lymph nodes, cardiomegaly and CAD  Review of Systems A 10 point review of systems was performed and it is as noted above otherwise negative.  Patient Active Problem List   Diagnosis Date Noted   COPD exacerbation ([place]) 08/06/2021   Chronic diastolic CHF (congestive heart failure) ([place]) 08/06/2021   CAD (coronary artery disease)    Abnormal LFTs    Acute on chronic diastolic CHF (congestive heart failure) (HCC)    Hypokalemia 08/03/2021   Coronary artery disease of native artery of native heart with stable angina pectoris ([place]) 08/02/2021   Moderate mitral regurgitation 06/17/2021   Mitral valve stenosis 05/17/2021   Dyspnea 01/01/2021   Chronic respiratory failure with hypoxia ([place]) 01/01/2021   PAD (peripheral artery disease) ([place]) 11/13/2020   Hypochromic microcytic anemia 09/17/2020   Hyperlipidemia 09/17/2020   Paroxysmal atrial fibrillation ([place]) 08/30/2020   Carotid stenosis, right 08/15/2020   Carotid stenosis 07/17/2020   Meningioma ([place]) 07/04/2020   Cyst of thyroid 07/03/2020   Hypothyroidism 07/03/2020   Long-term use of high-risk medication 11/11/2016   COPD with acute bronchitis ([place]) 11/11/2016   Lower extremity edema 05/29/2016   Elevated troponin    Hypoxia    Encounter for anticoagulation discussion and counseling    Premature ventricular contraction 03/26/2015   Vitamin D deficiency 03/26/2015   Peripheral neuropathy 03/26/2015   Hypertension 03/26/2015   Asthma exacerbation 03/26/2015   Social History   Tobacco Use   Smoking status: Never   Smokeless tobacco: Never   Tobacco comments:    never   Substance Use Topics   Alcohol use: No   Allergies  Allergen Reactions   Shellfish Allergy Anaphylaxis    Other reaction(s): Hallucination   Azithromycin Other (See Comments)    Extreme burning sensation at IV site    Tamiflu [Oseltamivir Phosphate] Other (See  Comments)    Reaction:  Hallucinations     Albuterol Palpitations    Heart racing.    Current Meds  Medication Sig   amiodarone (PACERONE) 200 MG tablet Take 200 mg by mouth daily.   Calcium Carbonate-Vitamin D 600-400 MG-UNIT tablet Take 1 tablet by mouth daily.   cholecalciferol (VITAMIN D) 1000 UNITS tablet Take 1,000 Units by mouth daily.   clopidogrel (PLAVIX) 75 MG tablet Take 1 tablet (75 mg total) by mouth daily.   diltiazem (CARDIZEM CD) 180 MG 24 hr capsule TAKE 1 CAPSULE EVERY DAY   dorzolamide (TRUSOPT) 2 % ophthalmic solution Place 1 drop into both eyes 2 (two) times daily.    ezetimibe (ZETIA) 10 MG tablet TAKE 1 TABLET BY MOUTH DAILY.   hydrochlorothiazide (MICROZIDE) 12.5 MG capsule Take 12.5 mg by mouth daily.   ipratropium-albuterol (DUONEB) 0.5-2.5 (3) MG/3ML SOLN TAKE 3MLS BY NEBULIZATION EVERY 4 HOURS AS NEEDED   isosorbide mononitrate (IMDUR) 30 MG 24 hr tablet TAKE 1 TABLET BY MOUTH DAILY   latanoprost (XALATAN) 0.005 % ophthalmic solution Place 1 drop into both eyes at bedtime.    magnesium oxide (MAG-OX) 400 MG tablet Take 400 mg by mouth daily.   Multiple Vitamins-Minerals (PRESERVISION AREDS 2+MULTI VIT) CAPS Take 2 capsules by mouth daily.   mupirocin ointment (BACTROBAN) 2 % Apply 1 Application topically daily. With dressing changes   potassium chloride (KLOR-CON) 10 MEQ tablet TAKE 2 TABLETS BY MOUTH DAILY. TAKE EXTRA 2 TABLETS WHEN TAKING EXTRA LASIX.   pregabalin (LYRICA) 50 MG capsule TAKE  1 CAPSULE BY MOUTH 2 TIMES DAILY   rosuvastatin (CRESTOR) 20 MG tablet TAKE 1 TABLET BY MOUTH DAILY.   silver sulfADIAZINE (SILVADENE) 1 % cream Apply 1 Application topically 2 (two) times daily.   Tiotropium Bromide-Olodaterol (STIOLTO RESPIMAT) 2.5-2.5 MCG/ACT AERS Inhale 2 puffs into the lungs daily.   torsemide (DEMADEX) 20 MG tablet Take 2 tablets (40 mg total) by mouth 2 (two) times daily. MUST MAKE HF clinic appt for any further refills   vitamin C (ASCORBIC ACID)  500 MG tablet Take 500 mg by mouth daily.   vitamin E 100 UNIT capsule Take 200 Units by mouth daily.   warfarin (COUMADIN) 2.5 MG tablet TAKE 1 TABLET BY MOUTH DAILY AS DIRECTEDBY THE ANTI-COAG CLINIC   levalbuterol (XOPENEX HFA) 45 MCG/ACT inhaler Inhale 2 puffs into the lungs every 6 (six) hours as needed for wheezing.   Immunization History  Administered Date(s) Administered   Fluad Quad(high Dose 65+) 05/18/2019, 05/02/2020, 05/29/2021, 06/13/2022   Influenza, High Dose Seasonal PF 08/28/2017, 05/29/2018   PFIZER(Purple Top)SARS-COV-2 Vaccination 10/13/2019, 11/03/2019, 07/26/2020   Pneumococcal Conjugate-13 05/30/2014   Pneumococcal Polysaccharide-23 06/09/1985, 06/15/2006   Zoster Recombinat (Shingrix) 05/05/2018, 07/20/2018      Objective:   Physical Exam BP 122/78 (BP Location: Left Arm, Cuff Size: Normal)   Pulse (!) 54   Temp (!) 97 F (36.1 C)   Ht '5\' 5"'$  (1.651 m)   Wt 128 lb 12.8 oz (58.4 kg)   SpO2 99%   BMI 21.43 kg/m  GENERAL: Thin, well-developed elderly woman in no respiratory distress.  She presents in transport chair.  No conversational dyspnea. HEAD: Normocephalic, atraumatic.  EYES: Pupils equal, round, reactive to light.  No scleral icterus.  MOUTH: Intact dentition, oral mucosa moist.  No thrush. NECK: Supple. No thyromegaly. Trachea midline. No JVD.  No adenopathy.  Right neck well-healed surgical scar.  PULMONARY: Good air entry bilaterally.  No adventitious sounds. CARDIOVASCULAR: S1 and S2.  Irregular rate and rhythm.  Mitral midsystolic click noted.  No murmurs. ABDOMEN: Benign. MUSCULOSKELETAL: No joint deformity, no clubbing, no edema.  NEUROLOGIC: No focal deficit, no gait disturbance, speech is fluent. SKIN: Intact,warm,dry.  No rashes noted. PSYCH: Mood and behavior normal.     Assessment & Plan:     ICD-10-CM   1. Shortness of breath  R06.02    Per patient this has resolved after PCI to LAD and D1    2. Nocturnal hypoxia  G47.34    Due  to cardiac disease Patient discontinued oxygen AGAINST MEDICAL ADVICE    3. Mitral valve disease  I05.9    This issue adds complexity to her management Mitral stenosis/regurgitation Follows with cardiology    4. History of asthma  Z87.09    Likely "Cardiac asthma" Patient asymptomatic in this regard Rare use of rescue inhaler     Meds ordered this encounter  Medications   levalbuterol (XOPENEX HFA) 45 MCG/ACT inhaler    Sig: Inhale 2 puffs into the lungs every 6 (six) hours as needed for wheezing or shortness of breath.    Dispense:  1 each    Refill:  3   It appears this patient's issues were more related to cardiac disease.  We will have the patient continue follow-up with primary care physician and cardiology.  She is to follow-up here on an as-needed basis.   Renold Don, MD Advanced Bronchoscopy PCCM [facility]    *This note was dictated using voice recognition software/Dragon.  Despite best  efforts to proofread, errors can occur which can change the meaning. Any transcriptional errors that result from this process are unintentional and may not be fully corrected at the time of dictation.

## 2022-07-08 NOTE — Patient Instructions (Signed)
We have refilled your emergency inhaler.  You do not need to take any other inhalers.  Please keep your follow-up visits with Dr. Caryn Section and Dr. Rockey Situ.  We will see you in follow-up on an as-needed basis.

## 2022-07-09 ENCOUNTER — Other Ambulatory Visit
Admission: RE | Admit: 2022-07-09 | Discharge: 2022-07-09 | Disposition: A | Discharge status: Home / Self Care | Payer: Medicare Other | Source: Ambulatory Visit | Attending: Cardiovascular Disease | Admitting: Cardiovascular Disease

## 2022-07-09 ENCOUNTER — Encounter: Payer: Self-pay | Admitting: Cardiovascular Disease

## 2022-07-09 ENCOUNTER — Ambulatory Visit: Payer: Medicare Other | Attending: Cardiovascular Disease | Admitting: Cardiovascular Disease

## 2022-07-09 ENCOUNTER — Telehealth: Payer: Self-pay

## 2022-07-09 ENCOUNTER — Ambulatory Visit (INDEPENDENT_AMBULATORY_CARE_PROVIDER_SITE_OTHER): Payer: Medicare Other

## 2022-07-09 VITALS — BP 120/50 | HR 51 | Ht 65.0 in | Wt 128.0 lb

## 2022-07-09 DIAGNOSIS — I5032 Chronic diastolic (congestive) heart failure: Secondary | ICD-10-CM

## 2022-07-09 DIAGNOSIS — I272 Pulmonary hypertension, unspecified: Secondary | ICD-10-CM

## 2022-07-09 DIAGNOSIS — J449 Chronic obstructive pulmonary disease, unspecified: Secondary | ICD-10-CM | POA: Diagnosis not present

## 2022-07-09 DIAGNOSIS — Z5181 Encounter for therapeutic drug level monitoring: Secondary | ICD-10-CM | POA: Diagnosis not present

## 2022-07-09 DIAGNOSIS — I493 Ventricular premature depolarization: Secondary | ICD-10-CM

## 2022-07-09 DIAGNOSIS — I6523 Occlusion and stenosis of bilateral carotid arteries: Secondary | ICD-10-CM

## 2022-07-09 DIAGNOSIS — I480 Paroxysmal atrial fibrillation: Secondary | ICD-10-CM

## 2022-07-09 DIAGNOSIS — I10 Essential (primary) hypertension: Secondary | ICD-10-CM

## 2022-07-09 DIAGNOSIS — E785 Hyperlipidemia, unspecified: Secondary | ICD-10-CM | POA: Diagnosis not present

## 2022-07-09 DIAGNOSIS — I050 Rheumatic mitral stenosis: Secondary | ICD-10-CM | POA: Insufficient documentation

## 2022-07-09 DIAGNOSIS — I25118 Atherosclerotic heart disease of native coronary artery with other forms of angina pectoris: Secondary | ICD-10-CM | POA: Diagnosis not present

## 2022-07-09 LAB — BASIC METABOLIC PANEL
Anion gap: 9 (ref 5–15)
BUN: 30 mg/dL — ABNORMAL HIGH (ref 8–23)
CO2: 29 mmol/L (ref 22–32)
Calcium: 8.6 mg/dL — ABNORMAL LOW (ref 8.9–10.3)
Chloride: 105 mmol/L (ref 98–111)
Creatinine, Ser: 1.56 mg/dL — ABNORMAL HIGH (ref 0.44–1.00)
GFR, Estimated: 32 mL/min — ABNORMAL LOW (ref >60)
Glucose, Bld: 98 mg/dL (ref 70–99)
Potassium: 4.2 mmol/L (ref 3.5–5.1)
Sodium: 143 mmol/L (ref 135–145)

## 2022-07-09 LAB — POCT INR: INR: 2.8 (ref 2.0–3.0)

## 2022-07-09 MED ORDER — TORSEMIDE 20 MG PO TABS: 40.0000 mg | ORAL_TABLET | Freq: Every day | ORAL | 0 refills | Status: DC

## 2022-07-09 NOTE — Telephone Encounter (Signed)
-----   Message from Minna Merritts, MD sent at 07/09/2022  1:37 PM EST ----- Lab work reviewed Kidney numbers elevated consistent with hypovolemia/dehydration Would recommend we decrease torsemide down to 40 mg daily not 40 mg twice a day Repeat BMP in 1 month

## 2022-07-09 NOTE — Patient Instructions (Signed)
Continue 1.5 tablets every day, EXCEPT 1 TABLET ON MONDAY, WEDNESDAY AND FRIDAY - Recheck INR in 8 weeks

## 2022-07-09 NOTE — Patient Instructions (Addendum)
Medication Instructions:  Stop the amiodarone  If you need a refill on your cardiac medications before your next appointment, please call your pharmacy.   Lab work: BMP,   in hospital  Testing/Procedures: No new testing needed  Follow-Up: At [facility], you and your health needs are our priority.  As part of our continuing mission to provide you with exceptional heart care, we have created designated Provider Care Teams.  These Care Teams include your primary Cardiologist (physician) and Advanced Practice Providers (APPs -  Physician Assistants and Nurse Practitioners) who all work together to provide you with the care you need, when you need it.  You will need a follow up appointment in 6 months  Providers on your designated Care Team:   Murray Hodgkins, NP Christell Faith, PA-C Cadence Kathlen Mody, [place]  COVID-19 Vaccine Information can be found at: [email or web address] For questions related to vaccine distribution or appointments, please email [email or web address] or call [phone].

## 2022-07-09 NOTE — Telephone Encounter (Signed)
The patient has been notified of the result and verbalized understanding.  All questions (if any) were answered. Kavin Leech, RN 07/09/2022 4:29 PM

## 2022-07-13 ENCOUNTER — Other Ambulatory Visit: Payer: Self-pay | Admitting: Cardiovascular Disease

## 2022-07-13 ENCOUNTER — Other Ambulatory Visit: Payer: Self-pay | Admitting: Family Medicine

## 2022-07-13 ENCOUNTER — Other Ambulatory Visit: Payer: Self-pay | Admitting: Student

## 2022-07-13 ENCOUNTER — Other Ambulatory Visit: Payer: Self-pay | Admitting: Family

## 2022-07-13 DIAGNOSIS — E785 Hyperlipidemia, unspecified: Secondary | ICD-10-CM

## 2022-07-14 ENCOUNTER — Other Ambulatory Visit: Payer: Self-pay | Admitting: Family Medicine

## 2022-07-15 ENCOUNTER — Ambulatory Visit: Payer: Self-pay | Admitting: *Deleted

## 2022-07-15 ENCOUNTER — Encounter: Payer: Self-pay | Admitting: Cardiovascular Disease

## 2022-07-15 DIAGNOSIS — U071 COVID-19: Secondary | ICD-10-CM | POA: Diagnosis not present

## 2022-07-15 DIAGNOSIS — N1832 Chronic kidney disease, stage 3b: Secondary | ICD-10-CM | POA: Diagnosis not present

## 2022-07-15 DIAGNOSIS — Z7901 Long term (current) use of anticoagulants: Secondary | ICD-10-CM | POA: Diagnosis not present

## 2022-07-15 DIAGNOSIS — J439 Emphysema, unspecified: Secondary | ICD-10-CM | POA: Diagnosis not present

## 2022-07-15 DIAGNOSIS — R051 Acute cough: Secondary | ICD-10-CM | POA: Diagnosis not present

## 2022-07-15 DIAGNOSIS — J449 Chronic obstructive pulmonary disease, unspecified: Secondary | ICD-10-CM | POA: Diagnosis not present

## 2022-07-15 DIAGNOSIS — Z03818 Encounter for observation for suspected exposure to other biological agents ruled out: Secondary | ICD-10-CM | POA: Diagnosis not present

## 2022-07-15 NOTE — Telephone Encounter (Signed)
Reason for Disposition  [1] Longstanding difficulty breathing (e.g., CHF, COPD, emphysema) AND [2] WORSE than normal  Answer Assessment - Initial Assessment Questions 1. RESPIRATORY STATUS: "Describe your breathing?" (e.g., wheezing, shortness of breath, unable to speak, severe coughing)      Shortness of breath and chest congestion.   I have asthma.   I'm using my nebulizer I'm using.     If I wait it goes into serious. When I move around I'm short of breath. 2. ONSET: "When did this breathing problem begin?"      Nasal congestion.   Last night it closed in.   A 3 days ago started with a runny nose.   Last night it went into my chest. 3. PATTERN "Does the difficult breathing come and go, or has it been constant since it started?"      Just with moving around.   4. SEVERITY: "How bad is your breathing?" (e.g., mild, moderate, severe)    - MILD: No SOB at rest, mild SOB with walking, speaks normally in sentences, can lie down, no retractions, pulse < 100.    - MODERATE: SOB at rest, SOB with minimal exertion and prefers to sit, cannot lie down flat, speaks in phrases, mild retractions, audible wheezing, pulse 100-120.    - SEVERE: Very SOB at rest, speaks in single words, struggling to breathe, sitting hunched forward, retractions, pulse > 120      Minimal shortness of breath with moving around.   Fine when I'm sitting still. 5. RECURRENT SYMPTOM: "Have you had difficulty breathing before?" If Yes, ask: "When was the last time?" and "What happened that time?"      Yes   I have asthma 6. CARDIAC HISTORY: "Do you have any history of heart disease?" (e.g., heart attack, angina, bypass surgery, angioplasty)      Not asked 7. LUNG HISTORY: "Do you have any history of lung disease?"  (e.g., pulmonary embolus, asthma, emphysema)     Yes   asthma 8. CAUSE: "What do you think is causing the breathing problem?"      When I start getting congestion I can't wait or I end up in trouble due to my asthma. 9.  OTHER SYMPTOMS: "Do you have any other symptoms? (e.g., dizziness, runny nose, cough, chest pain, fever)     Runny nose, chest congestion, mild cough 10. O2 SATURATION MONITOR:  "Do you use an oxygen saturation monitor (pulse oximeter) at home?" If Yes, ask: "What is your reading (oxygen level) today?" "What is your usual oxygen saturation reading?" (e.g., 95%)       Not asked 11. PREGNANCY: "Is there any chance you are pregnant?" "When was your last menstrual period?"       N/A due to age 34. TRAVEL: "Have you traveled out of the country in the last month?" (e.g., travel history, exposures)       Not asked  Protocols used: Breathing Difficulty-A-AH

## 2022-07-15 NOTE — Telephone Encounter (Signed)
  Chief Complaint: Has asthma.   Started with nasal congestion now it's in her chest as of last night Symptoms: Shortness of breath with exertion.   None while sitting Frequency: Started 3 days ago with nasal congestion Pertinent Negatives: Patient denies fever Disposition: '[]'$ ED /'[x]'$ Urgent Care (no appt availability in office) / '[]'$ Appointment(In office/virtual)/ '[]'$  [facility]/ '[]'$ Home Care/ '[]'$ Refused Recommended Disposition /'[]'$ [facility]/ '[]'$  Follow-up with PCP Additional Notes: I offered her a virtual visit however she prefers to go on to the urgent care since there are no appts available at [facility].

## 2022-07-21 ENCOUNTER — Other Ambulatory Visit: Payer: Self-pay | Admitting: Cardiovascular Disease

## 2022-07-21 NOTE — Telephone Encounter (Signed)
Refill request for warfarin:  Last INR was 2.8 on 07/09/22 Next INR due 09/03/22 LOV was 07/09/22  Johnny Bridge MD  Refill approved.

## 2022-07-21 NOTE — Telephone Encounter (Signed)
Please review

## 2022-07-25 ENCOUNTER — Ambulatory Visit (INDEPENDENT_AMBULATORY_CARE_PROVIDER_SITE_OTHER): Payer: Medicare Other | Admitting: Family Medicine

## 2022-07-25 ENCOUNTER — Encounter: Payer: Self-pay | Admitting: Family Medicine

## 2022-07-25 VITALS — BP 128/47 | HR 71 | Resp 16 | Wt 124.0 lb

## 2022-07-25 DIAGNOSIS — U071 COVID-19: Secondary | ICD-10-CM

## 2022-07-25 DIAGNOSIS — R531 Weakness: Secondary | ICD-10-CM

## 2022-07-25 MED ORDER — LEVALBUTEROL TARTRATE 45 MCG/ACT IN AERO: 2.0000 | INHALATION_SPRAY | Freq: Four times a day (QID) | RESPIRATORY_TRACT | 3 refills | Status: DC | PRN

## 2022-07-25 NOTE — Patient Instructions (Signed)
Please review the attached list of medications and notify my office if there are any errors.    

## 2022-07-25 NOTE — Progress Notes (Signed)
I,April Miller,acting as a scribe for Lelon Huh, MD.,have documented all relevant documentation on the behalf of Lelon Huh, MD,as directed by  Lelon Huh, MD while in the presence of Lelon Huh, MD.   Established patient visit   Patient: Molly Hodges   DOB: Oct 19, 1931   88 y.o. Female  MRN: [id number] Visit Date: 07/25/2022  Today's healthcare provider: Lelon Huh, MD   No chief complaint on file.  Subjective    HPI  Patient is here to follow up on recent Covid-19 pneumonia seen at Urgent Care 11/28 and prescribed molnupiravir, cefpodoxime, doxycycline, and 5 days prednisone. She is much improved, but cough has not resolved. Is much less short of breath. She is still very week and has difficulty with activities of daily living including ambulating around her home, preparing meals and cleaning due to generalized weakness. She would like home health to help with ADLs which she states her insurance does cover. She has contacted [facility] to initiate services.   Medications: Outpatient Medications Prior to Visit  Medication Sig   Calcium Carbonate-Vitamin D 600-400 MG-UNIT tablet Take 1 tablet by mouth daily.   cholecalciferol (VITAMIN D) 1000 UNITS tablet Take 1,000 Units by mouth daily.   clopidogrel (PLAVIX) 75 MG tablet TAKE 1 TABLET BY MOUTH DAILY   diltiazem (CARDIZEM CD) 180 MG 24 hr capsule TAKE 1 CAPSULE EVERY DAY   dorzolamide (TRUSOPT) 2 % ophthalmic solution Place 1 drop into both eyes 2 (two) times daily.    ezetimibe (ZETIA) 10 MG tablet TAKE 1 TABLET BY MOUTH DAILY.   hydrochlorothiazide (MICROZIDE) 12.5 MG capsule Take 12.5 mg by mouth daily.   ipratropium-albuterol (DUONEB) 0.5-2.5 (3) MG/3ML SOLN TAKE 3MLS BY NEBULIZATION EVERY 4 HOURS AS NEEDED   isosorbide mononitrate (IMDUR) 30 MG 24 hr tablet TAKE 1 TABLET BY MOUTH DAILY   latanoprost (XALATAN) 0.005 % ophthalmic solution Place 1 drop into both eyes at bedtime.    levalbuterol  (XOPENEX HFA) 45 MCG/ACT inhaler Inhale 2 puffs into the lungs every 6 (six) hours as needed for wheezing or shortness of breath.   magnesium oxide (MAG-OX) 400 MG tablet Take 400 mg by mouth daily.   Multiple Vitamins-Minerals (PRESERVISION AREDS 2+MULTI VIT) CAPS Take 2 capsules by mouth daily.   mupirocin ointment (BACTROBAN) 2 % Apply 1 Application topically daily. With dressing changes   potassium chloride (KLOR-CON) 10 MEQ tablet TAKE 2 TABLETS BY MOUTH DAILY. TAKE EXTRA 2 TABLETS WHEN TAKING EXTRA LASIX.   pregabalin (LYRICA) 50 MG capsule TAKE 1 CAPSULE BY MOUTH 2 TIMES DAILY   rosuvastatin (CRESTOR) 20 MG tablet TAKE 1 TABLET BY MOUTH DAILY.   silver sulfADIAZINE (SILVADENE) 1 % cream Apply 1 Application topically 2 (two) times daily.   Tiotropium Bromide-Olodaterol (STIOLTO RESPIMAT) 2.5-2.5 MCG/ACT AERS Inhale 2 puffs into the lungs daily.   torsemide (DEMADEX) 20 MG tablet TAKE 2 TABLETS BY MOUTH TWICE DAILY *DISCONTINUE FUROSEMIDE*   vitamin C (ASCORBIC ACID) 500 MG tablet Take 500 mg by mouth daily.   vitamin E 100 UNIT capsule Take 200 Units by mouth daily.   warfarin (COUMADIN) 2.5 MG tablet Take 1 1/2 tablets daily except take 1 tablet on Mondays, Wednesdays and Fridays or as directed by coumadin clinic.   nitroGLYCERIN (NITROSTAT) 0.4 MG SL tablet Place 1 tablet (0.4 mg total) under the tongue every 5 (five) minutes as needed (for chest pain or shortness of breath).   [DISCONTINUED] cephALEXin (KEFLEX) 500 MG capsule Take 1  tablet tid x 5 days (Patient not taking: Reported on 06/24/2022)   No facility-administered medications prior to visit.    Review of Systems  Constitutional:  Negative for appetite change, chills, fatigue and fever.  Respiratory:  Negative for chest tightness and shortness of breath.   Cardiovascular:  Negative for chest pain and palpitations.  Gastrointestinal:  Negative for abdominal pain, nausea and vomiting.  Neurological:  Positive for weakness.  Negative for dizziness.       Objective    BP (!) 128/47 (BP Location: Left Arm, Patient Position: Sitting, Cuff Size: Normal)   Pulse 71   Resp 16   Wt 124 lb (56.2 kg)   SpO2 98%   BMI 20.63 kg/m    Physical Exam    General: Appearance:    Well developed, well nourished female in no acute distress  Eyes:    PERRL, conjunctiva/corneas clear, EOM's intact       Lungs:     Clear to auscultation bilaterally, respirations unlabored  Heart:    Normal heart rate. Irregularly irregular rhythm.  2/6 blowing, holosystolic murmur at apex 1/6 low pitched, rumbling, decrescendo, diastolic murmur at apex   MS:   All extremities are intact.  Able to stand but has to stop and rest after walking 10-15 feet. 2  Neurologic:   Awake, alert, oriented x 3. No apparent focal neurological defect.          Assessment & Plan     1. COVID-19 Completed molnupiravir, doxy and Vantin and significantly improved although not back to baseline.   - levalbuterol (XOPENEX HFA) 45 MCG/ACT inhaler; Inhale 2 puffs into the lungs every 6 (six) hours as needed for wheezing or shortness of breath.  Dispense: 1 each; Refill: 3   2. Generalized weakness Unable to perform her ADLs without assistance and is need of home care assistance for preparing meals, cleaning and getting around home.       The entirety of the information documented in the History of Present Illness, Review of Systems and Physical Exam were personally obtained by me. Portions of this information were initially documented by the CMA and reviewed by me for thoroughness and accuracy.     Lelon Huh, MD  [facility] [phone] (phone) [phone] (fax)  [place]

## 2022-07-30 ENCOUNTER — Telehealth: Payer: Self-pay | Admitting: Family Medicine

## 2022-07-30 NOTE — Telephone Encounter (Signed)
I sent in a prescription for xopenex which I thought is the she was talking about that "mash"... is that not it? I don't know which one you "mash"and which one you "twist"... I'll need to know the brand name the one wants. She might need to talk to the pharmacist to figure out which one she is talking about.

## 2022-07-30 NOTE — Telephone Encounter (Signed)
Pt called to report that her PCP was supposed to call in a new inhaler for her following her most recent visit. [facility]   She says this is not one that you twist, its one that you mash because she has neuropathy.   [facility] - [place], [place] - [address]  [place] [address]  Phone: [phone] Fax: [phone]

## 2022-07-31 NOTE — Telephone Encounter (Signed)
Patient advised xopenx inhaler was sent in to pharmacy pn 07/25/22. Patient will fu with pharmacy.

## 2022-09-03 ENCOUNTER — Ambulatory Visit: Payer: Medicare Other | Attending: Cardiovascular Disease

## 2022-09-03 DIAGNOSIS — I480 Paroxysmal atrial fibrillation: Secondary | ICD-10-CM

## 2022-09-03 DIAGNOSIS — I050 Rheumatic mitral stenosis: Secondary | ICD-10-CM

## 2022-09-03 DIAGNOSIS — Z5181 Encounter for therapeutic drug level monitoring: Secondary | ICD-10-CM | POA: Diagnosis not present

## 2022-09-03 LAB — POCT INR: INR: 3.9 — AB (ref 2.0–3.0)

## 2022-09-03 NOTE — Patient Instructions (Signed)
HOLD TODAY ONLY and then Continue 1.5 tablets every day, EXCEPT 1 TABLET ON MONDAY, WEDNESDAY AND FRIDAY - Recheck INR in 6 weeks

## 2022-09-13 ENCOUNTER — Other Ambulatory Visit: Payer: Self-pay | Admitting: Family Medicine

## 2022-09-15 NOTE — Telephone Encounter (Signed)
Requested medication (s) are due for refill today: yes  Requested medication (s) are on the active medication list: yes  Last refill:  07/14/22 #120/0  Future visit scheduled: no  Notes to clinic:  pt meets protocol but unsure if PCP or Cardiology prescribing, please review.      Requested Prescriptions  Pending Prescriptions Disp Refills   torsemide (DEMADEX) 20 MG tablet [Pharmacy Med Name: TORSEMIDE 20 MG TAB] 120 tablet 0    Sig: TAKE 2 TABLETS BY MOUTH TWICE DAILY *DISCONTINUE FUROSEMIDE*     Cardiovascular:  Diuretics - Loop Failed - 09/13/2022  1:47 PM      Failed - Ca in normal range and within 180 days    Calcium  Date Value Ref Range Status  07/09/2022 8.6 (L) 8.9 - 10.3 mg/dL Final   Calcium, Total  Date Value Ref Range Status  05/10/2012 10.2 (H) 8.5 - 10.1 mg/dL Final         Failed - Cr in normal range and within 180 days    Creatinine  Date Value Ref Range Status  05/10/2012 0.99 0.60 - 1.30 mg/dL Final   Creatinine, Ser  Date Value Ref Range Status  07/09/2022 1.56 (H) 0.44 - 1.00 mg/dL Final         Failed - Mg Level in normal range and within 180 days    Magnesium  Date Value Ref Range Status  08/23/2021 2.1 1.7 - 2.4 mg/dL Final    Comment:    Performed at [facility], [address] [place], [place] [address]         Failed - Last BP in normal range    BP Readings from Last 1 Encounters:  07/25/22 (!) 128/47         Passed - K in normal range and within 180 days    Potassium  Date Value Ref Range Status  07/09/2022 4.2 3.5 - 5.1 mmol/L Final  05/10/2012 3.7 3.5 - 5.1 mmol/L Final         Passed - Na in normal range and within 180 days    Sodium  Date Value Ref Range Status  07/09/2022 143 135 - 145 mmol/L Final  05/30/2021 141 134 - 144 mmol/L Final  05/10/2012 143 136 - 145 mmol/L Final         Passed - Cl in normal range and within 180 days    Chloride  Date Value Ref Range Status  07/09/2022 105 98 - 111 mmol/L  Final  05/10/2012 104 98 - 107 mmol/L Final         Passed - Valid encounter within last 6 months    Recent Outpatient Visits           1 month ago [place], Donald E, MD   3 months ago Need for immunization against influenza   [place], Donald E, MD   1 year ago Moderate persistent asthma, unspecified whether complicated   [place], Donald E, MD   1 year ago [place] Carles Collet M, [place]   1 year ago Uncomplicated asthma, unspecified asthma severity, unspecified whether persistent   [facility] Trinna Post, [place]       Future Appointments             In 1 month Ralene Bathe, MD [facility]   In 3 months Gollan, Kathlene November, MD [place] [facility]

## 2022-10-06 ENCOUNTER — Other Ambulatory Visit: Payer: Self-pay | Admitting: Cardiovascular Disease

## 2022-10-15 ENCOUNTER — Ambulatory Visit: Payer: Medicare Other

## 2022-10-20 ENCOUNTER — Ambulatory Visit: Payer: Medicare Other | Admitting: Dermatology

## 2022-10-20 VITALS — BP 136/68 | HR 68

## 2022-10-20 DIAGNOSIS — D0472 Carcinoma in situ of skin of left lower limb, including hip: Secondary | ICD-10-CM | POA: Diagnosis not present

## 2022-10-20 DIAGNOSIS — L820 Inflamed seborrheic keratosis: Secondary | ICD-10-CM

## 2022-10-20 DIAGNOSIS — L578 Other skin changes due to chronic exposure to nonionizing radiation: Secondary | ICD-10-CM

## 2022-10-20 DIAGNOSIS — D485 Neoplasm of uncertain behavior of skin: Secondary | ICD-10-CM

## 2022-10-20 DIAGNOSIS — D229 Melanocytic nevi, unspecified: Secondary | ICD-10-CM | POA: Diagnosis not present

## 2022-10-20 DIAGNOSIS — Z8589 Personal history of malignant neoplasm of other organs and systems: Secondary | ICD-10-CM

## 2022-10-20 DIAGNOSIS — L814 Other melanin hyperpigmentation: Secondary | ICD-10-CM | POA: Diagnosis not present

## 2022-10-20 DIAGNOSIS — Z85828 Personal history of other malignant neoplasm of skin: Secondary | ICD-10-CM

## 2022-10-20 NOTE — Patient Instructions (Addendum)
Electrodesiccation and Curettage ("Scrape and Burn") Wound Care Instructions  Leave the original bandage on for 24 hours if possible.  If the bandage becomes soaked or soiled before that time, it is OK to remove it and examine the wound.  A small amount of post-operative bleeding is normal.  If excessive bleeding occurs, remove the bandage, place gauze over the site and apply continuous pressure (no peeking) over the area for 30 minutes. If this does not work, please call our clinic as soon as possible or page your doctor if it is after hours.   Once a day, cleanse the wound with soap and water. It is fine to shower. If a thick crust develops you may use a Q-tip dipped into dilute hydrogen peroxide (mix 1:1 with water) to dissolve it.  Hydrogen peroxide can slow the healing process, so use it only as needed.    After washing, apply petroleum jelly (Vaseline) or an antibiotic ointment if your doctor prescribed one for you, followed by a bandage.    For best healing, the wound should be covered with a layer of ointment at all times. If you are not able to keep the area covered with a bandage to hold the ointment in place, this may mean re-applying the ointment several times a day.  Continue this wound care until the wound has healed and is no longer open. It may take several weeks for the wound to heal and close.  Itching and mild discomfort is normal during the healing process.  If you have any discomfort, you can take Tylenol (acetaminophen) or ibuprofen as directed on the bottle. (Please do not take these if you have an allergy to them or cannot take them for another reason).  Some redness, tenderness and white or yellow material in the wound is normal healing.  If the area becomes very sore and red, or develops a thick yellow-green material (pus), it may be infected; please notify us.    Wound healing continues for up to one year following surgery. It is not unusual to experience pain in the scar  from time to time during the interval.  If the pain becomes severe or the scar thickens, you should notify the office.    A slight amount of redness in a scar is expected for the first six months.  After six months, the redness will fade and the scar will soften and fade.  The color difference becomes less noticeable with time.  If there are any problems, return for a post-op surgery check at your earliest convenience.  To improve the appearance of the scar, you can use silicone scar gel, cream, or sheets (such as Mederma or Serica) every night for up to one year. These are available over the counter (without a prescription).  Please call our office at [phone] for any questions or concerns.     Due to recent changes in healthcare laws, you may see results of your pathology and/or laboratory studies on MyChart before the doctors have had a chance to review them. We understand that in some cases there may be results that are confusing or concerning to you. Please understand that not all results are received at the same time and often the doctors may need to interpret multiple results in order to provide you with the best plan of care or course of treatment. Therefore, we ask that you please give us 2 business days to thoroughly review all your results before contacting the office for clarification. Should   we see a critical lab result, you will be contacted sooner.   If You Need Anything After Your Visit  If you have any questions or concerns for your doctor, please call our main line at [phone] and press option 4 to reach your doctor's medical assistant. If no one answers, please leave a voicemail as directed and we will return your call as soon as possible. Messages left after 4 pm will be answered the following business day.   You may also send us a message via MyChart. We typically respond to MyChart messages within 1-2 business days.  For prescription refills, please ask your  pharmacy to contact our office. Our fax number is [phone].  If you have an urgent issue when the clinic is closed that cannot wait until the next business day, you can page your doctor at the number below.    Please note that while we do our best to be available for urgent issues outside of office hours, we are not available 24/7.   If you have an urgent issue and are unable to reach us, you may choose to seek medical care at your doctor's office, retail clinic, urgent care center, or emergency room.  If you have a medical emergency, please immediately call 911 or go to the emergency department.  Pager Numbers  - Dr. Kowalski: [phone]  - Dr. Moye: [phone]  - Dr. Stewart: [phone]  In the event of inclement weather, please call our main line at [phone] for an update on the status of any delays or closures.  Dermatology Medication Tips: Please keep the boxes that topical medications come in in order to help keep track of the instructions about where and how to use these. Pharmacies typically print the medication instructions only on the boxes and not directly on the medication tubes.   If your medication is too expensive, please contact our office at [phone] option 4 or send us a message through MyChart.   We are unable to tell what your co-pay for medications will be in advance as this is different depending on your insurance coverage. However, we may be able to find a substitute medication at lower cost or fill out paperwork to get insurance to cover a needed medication.   If a prior authorization is required to get your medication covered by your insurance company, please allow us 1-2 business days to complete this process.  Drug prices often vary depending on where the prescription is filled and some pharmacies may offer cheaper prices.  The website www.goodrx.com contains coupons for medications through different pharmacies. The prices here do not  account for what the cost may be with help from insurance (it may be cheaper with your insurance), but the website can give you the price if you did not use any insurance.  - You can print the associated coupon and take it with your prescription to the pharmacy.  - You may also stop by our office during regular business hours and pick up a GoodRx coupon card.  - If you need your prescription sent electronically to a different pharmacy, notify our office through [place] MyChart or by phone at [phone] option 4.     Si Usted Necesita Algo Despus de Su Visita  Tambin puede enviarnos un mensaje a travs de MyChart. Por lo general respondemos a los mensajes de MyChart en el transcurso de 1 a 2 das hbiles.  Para renovar recetas, por favor pida a su farmacia que se ponga en   contacto con nuestra oficina. Nuestro nmero de fax es el [phone].  Si tiene un asunto urgente cuando la clnica est cerrada y que no puede esperar hasta el siguiente da hbil, puede llamar/localizar a su doctor(a) al nmero que aparece a continuacin.   Por favor, tenga en cuenta que aunque hacemos todo lo posible para estar disponibles para asuntos urgentes fuera del horario de oficina, no estamos disponibles las 24 horas del da, los 7 das de la semana.   Si tiene un problema urgente y no puede comunicarse con nosotros, puede optar por buscar atencin mdica  en el consultorio de su doctor(a), en una clnica privada, en un centro de atencin urgente o en una sala de emergencias.  Si tiene una emergencia mdica, por favor llame inmediatamente al 911 o vaya a la sala de emergencias.  Nmeros de bper  - Dr. Kowalski: [phone]  - Dra. Moye: [phone]  - Dra. Stewart: [phone]  En caso de inclemencias del tiempo, por favor llame a nuestra lnea principal al [phone] para una actualizacin sobre el estado de cualquier retraso o cierre.  Consejos para la medicacin en dermatologa: Por  favor, guarde las cajas en las que vienen los medicamentos de uso tpico para ayudarle a seguir las instrucciones sobre dnde y cmo usarlos. Las farmacias generalmente imprimen las instrucciones del medicamento slo en las cajas y no directamente en los tubos del medicamento.   Si su medicamento es muy caro, por favor, pngase en contacto con nuestra oficina llamando al [phone] y presione la opcin 4 o envenos un mensaje a travs de MyChart.   No podemos decirle cul ser su copago por los medicamentos por adelantado ya que esto es diferente dependiendo de la cobertura de su seguro. Sin embargo, es posible que podamos encontrar un medicamento sustituto a menor costo o llenar un formulario para que el seguro cubra el medicamento que se considera necesario.   Si se requiere una autorizacin previa para que su compaa de seguros cubra su medicamento, por favor permtanos de 1 a 2 das hbiles para completar este proceso.  Los precios de los medicamentos varan con frecuencia dependiendo del lugar de dnde se surte la receta y alguna farmacias pueden ofrecer precios ms baratos.  El sitio web www.goodrx.com tiene cupones para medicamentos de diferentes farmacias. Los precios aqu no tienen en cuenta lo que podra costar con la ayuda del seguro (puede ser ms barato con su seguro), pero el sitio web puede darle el precio si no utiliz ningn seguro.  - Puede imprimir el cupn correspondiente y llevarlo con su receta a la farmacia.  - Tambin puede pasar por nuestra oficina durante el horario de atencin regular y recoger una tarjeta de cupones de GoodRx.  - Si necesita que su receta se enve electrnicamente a una farmacia diferente, informe a nuestra oficina a travs de MyChart de [place] o por telfono llamando al [phone] y presione la opcin 4.  

## 2022-10-20 NOTE — Progress Notes (Signed)
Follow-Up Visit   Subjective  Molly Hodges or Molly Hodges is a 89 y.o. female who presents for the following: Actinic Keratosis (Patient is here today to check for precancerous skin lesions on sun exposed areas). The patient has spots, moles and lesions to be evaluated, some may be new or changing and the patient has concerns that these could be cancer.  The following portions of the chart were reviewed this encounter and updated as appropriate:   Tobacco  Allergies  Meds  Problems  Med Hx  Surg Hx  Fam Hx     Review of Systems:  No other skin or systemic complaints except as noted in HPI or Assessment and Plan.  Objective  Well appearing patient in no apparent distress; mood and affect are within normal limits.  A focused examination was performed including the face, arms, hands, and legs. Relevant physical exam findings are noted in the Assessment and Plan.  L lat knee Hyperkeratotic papule 1.2 cm   R eyebrow x 1, L temple x 4, R cheek x 1 (6) Erythematous stuck-on, waxy papule or plaque   Assessment & Plan  Neoplasm of uncertain behavior of skin L lat knee Epidermal / dermal shaving  Lesion diameter (cm):  1.2 Informed consent: discussed and consent obtained   Timeout: patient name, date of birth, surgical site, and procedure verified   Procedure prep:  Patient was prepped and draped in usual sterile fashion Prep type:  Isopropyl alcohol Anesthesia: the lesion was anesthetized in a standard fashion   Anesthetic:  1% lidocaine w/ epinephrine 1-100,000 buffered w/ 8.4% NaHCO3 Instrument used: flexible razor blade   Hemostasis achieved with: pressure, aluminum chloride and electrodesiccation   Outcome: patient tolerated procedure well   Post-procedure details: sterile dressing applied and wound care instructions given   Dressing type: bandage and petrolatum    Destruction of lesion Complexity: extensive   Destruction method: electrodesiccation and curettage    Informed consent: discussed and consent obtained   Timeout:  patient name, date of birth, surgical site, and procedure verified Procedure prep:  Patient was prepped and draped in usual sterile fashion Prep type:  Isopropyl alcohol Anesthesia: the lesion was anesthetized in a standard fashion   Anesthetic:  1% lidocaine w/ epinephrine 1-100,000 buffered w/ 8.4% NaHCO3 Curettage performed in three different directions: Yes   Electrodesiccation performed over the curetted area: Yes   Lesion length (cm):  1.2 Lesion width (cm):  1.2 Margin per side (cm):  0.2 Final wound size (cm):  1.6 Hemostasis achieved with:  pressure, aluminum chloride and electrodesiccation Outcome: patient tolerated procedure well with no complications   Post-procedure details: sterile dressing applied and wound care instructions given   Dressing type: bandage and petrolatum    Specimen 1 - Surgical pathology Differential Diagnosis: D48.5 r/o ISK vs SCC  ED&C today  Check Margins: No  Inflamed seborrheic keratosis (6) R eyebrow x 1, L temple x 4, R cheek x 1 Symptomatic, irritating, patient would like treated. Destruction of lesion - R eyebrow x 1, L temple x 4, R cheek x 1 Complexity: simple   Destruction method: cryotherapy   Informed consent: discussed and consent obtained   Timeout:  patient name, date of birth, surgical site, and procedure verified Lesion destroyed using liquid nitrogen: Yes   Region frozen until ice ball extended beyond lesion: Yes   Outcome: patient tolerated procedure well with no complications   Post-procedure details: wound care instructions given    Actinic Damage -  chronic, secondary to cumulative UV radiation exposure/sun exposure over time - diffuse scaly erythematous macules with underlying dyspigmentation - Recommend daily broad spectrum sunscreen SPF 30+ to sun-exposed areas, reapply every 2 hours as needed.  - Recommend staying in the shade or wearing long sleeves, sun  glasses (UVA+UVB protection) and wide brim hats (4-inch brim around the entire circumference of the hat). - Call for new or changing lesions.  Seborrheic Keratoses - Stuck-on, waxy, tan-brown papules and/or plaques  - Benign-appearing - Discussed benign etiology and prognosis. - Observe - Call for any changes  Lentigines - Scattered tan macules - Due to sun exposure - Benign-appearing, observe - Recommend daily broad spectrum sunscreen SPF 30+ to sun-exposed areas, reapply every 2 hours as needed. - Call for any changes  Melanocytic Nevi - Tan-brown and/or pink-flesh-colored symmetric macules and papules - Benign appearing on exam today - Observation - Call clinic for new or changing moles - Recommend daily use of broad spectrum spf 30+ sunscreen to sun-exposed areas.   History of Squamous Cell Carcinoma of the Skin - No evidence of recurrence today - No lymphadenopathy - Recommend regular full body skin exams - Recommend daily broad spectrum sunscreen SPF 30+ to sun-exposed areas, reapply every 2 hours as needed.  - Call if any new or changing lesions are noted between office visits  Return in about 6 months (around 04/22/2023) for TBSE.  Luther Redo, CMA, am acting as scribe for [name], MD . Documentation: I have reviewed the above documentation for accuracy and completeness, and I agree with the above.  [name], MD

## 2022-10-21 ENCOUNTER — Ambulatory Visit (INDEPENDENT_AMBULATORY_CARE_PROVIDER_SITE_OTHER): Payer: Medicare Other

## 2022-10-21 VITALS — BP 102/64 | Ht 65.0 in | Wt 124.6 lb

## 2022-10-21 DIAGNOSIS — Z0000 Encounter for general adult medical examination without abnormal findings: Secondary | ICD-10-CM

## 2022-10-21 NOTE — Patient Instructions (Signed)
Molly Hodges , Thank you for taking time to come for your Medicare Wellness Visit. I appreciate your ongoing commitment to your health goals. Please review the following plan we discussed and let me know if I can assist you in the future.   These are the goals we discussed:  Goals      DIET - EAT MORE FRUITS AND VEGETABLES     DIET - INCREASE WATER INTAKE     Recommend drinking 6-8 8 oz glasses of water a day.         This is a list of the screening recommended for you and due dates:  Health Maintenance  Topic Date Due   DEXA scan (bone density measurement)  Never done   DTaP/Tdap/Td vaccine (1 - Tdap) Never done   COVID-19 Vaccine (4 - 2023-24 season) 04/18/2022   Medicare Annual Wellness Visit  10/21/2023   Pneumonia Vaccine  Completed   Flu Shot  Completed   Zoster (Shingles) Vaccine  Completed   HPV Vaccine  Aged Out    Advanced directives: yes  Conditions/risks identified: falls risk  Next appointment: Follow up in one year for your annual wellness visit 10/26/2023 @ 1pm in person   Preventive Care 65 Years and Older, Female Preventive care refers to lifestyle choices and visits with your health care provider that can promote health and wellness. What does preventive care include? A yearly physical exam. This is also called an annual well check. Dental exams once or twice a year. Routine eye exams. Ask your health care provider how often you should have your eyes checked. Personal lifestyle choices, including: Daily care of your teeth and gums. Regular physical activity. Eating a healthy diet. Avoiding tobacco and drug use. Limiting alcohol use. Practicing safe sex. Taking low-dose aspirin every day. Taking vitamin and mineral supplements as recommended by your health care provider. What happens during an annual well check? The services and screenings done by your health care provider during your annual well check will depend on your age, overall health, lifestyle  risk factors, and family history of disease. Counseling  Your health care provider may ask you questions about your: Alcohol use. Tobacco use. Drug use. Emotional well-being. Home and relationship well-being. Sexual activity. Eating habits. History of falls. Memory and ability to understand (cognition). Work and work Statistician. Reproductive health. Screening  You may have the following tests or measurements: Height, weight, and BMI. Blood pressure. Lipid and cholesterol levels. These may be checked every 5 years, or more frequently if you are over 76 years old. Skin check. Lung cancer screening. You may have this screening every year starting at age 50 if you have a 30-pack-year history of smoking and currently smoke or have quit within the past 15 years. Fecal occult blood test (FOBT) of the stool. You may have this test every year starting at age 86. Flexible sigmoidoscopy or colonoscopy. You may have a sigmoidoscopy every 5 years or a colonoscopy every 10 years starting at age 36. Hepatitis C blood test. Hepatitis B blood test. Sexually transmitted disease (STD) testing. Diabetes screening. This is done by checking your blood sugar (glucose) after you have not eaten for a while (fasting). You may have this done every 1-3 years. Bone density scan. This is done to screen for osteoporosis. You may have this done starting at age 57. Mammogram. This may be done every 1-2 years. Talk to your health care provider about how often you should have regular mammograms. Talk with your  health care provider about your test results, treatment options, and if necessary, the need for more tests. Vaccines  Your health care provider may recommend certain vaccines, such as: Influenza vaccine. This is recommended every year. Tetanus, diphtheria, and acellular pertussis (Tdap, Td) vaccine. You may need a Td booster every 10 years. Zoster vaccine. You may need this after age 66. Pneumococcal  13-valent conjugate (PCV13) vaccine. One dose is recommended after age 16. Pneumococcal polysaccharide (PPSV23) vaccine. One dose is recommended after age 56. Talk to your health care provider about which screenings and vaccines you need and how often you need them. This information is not intended to replace advice given to you by your health care provider. Make sure you discuss any questions you have with your health care provider. Document Released: 08/31/2015 Document Revised: 04/23/2016 Document Reviewed: 06/05/2015 Elsevier Interactive Patient Education  2017 [place] Prevention in the Home Falls can cause injuries. They can happen to people of all ages. There are many things you can do to make your home safe and to help prevent falls. What can I do on the outside of my home? Regularly fix the edges of walkways and driveways and fix any cracks. Remove anything that might make you trip as you walk through a door, such as a raised step or threshold. Trim any bushes or trees on the path to your home. Use bright outdoor lighting. Clear any walking paths of anything that might make someone trip, such as rocks or tools. Regularly check to see if handrails are loose or broken. Make sure that both sides of any steps have handrails. Any raised decks and porches should have guardrails on the edges. Have any leaves, snow, or ice cleared regularly. Use sand or salt on walking paths during winter. Clean up any spills in your garage right away. This includes oil or grease spills. What can I do in the bathroom? Use night lights. Install grab bars by the toilet and in the tub and shower. Do not use towel bars as grab bars. Use non-skid mats or decals in the tub or shower. If you need to sit down in the shower, use a plastic, non-slip stool. Keep the floor dry. Clean up any water that spills on the floor as soon as it happens. Remove soap buildup in the tub or shower regularly. Attach  bath mats securely with double-sided non-slip rug tape. Do not have throw rugs and other things on the floor that can make you trip. What can I do in the bedroom? Use night lights. Make sure that you have a light by your bed that is easy to reach. Do not use any sheets or blankets that are too big for your bed. They should not hang down onto the floor. Have a firm chair that has side arms. You can use this for support while you get dressed. Do not have throw rugs and other things on the floor that can make you trip. What can I do in the kitchen? Clean up any spills right away. Avoid walking on wet floors. Keep items that you use a lot in easy-to-reach places. If you need to reach something above you, use a strong step stool that has a grab bar. Keep electrical cords out of the way. Do not use floor polish or wax that makes floors slippery. If you must use wax, use non-skid floor wax. Do not have throw rugs and other things on the floor that can make you trip. What  can I do with my stairs? Do not leave any items on the stairs. Make sure that there are handrails on both sides of the stairs and use them. Fix handrails that are broken or loose. Make sure that handrails are as long as the stairways. Check any carpeting to make sure that it is firmly attached to the stairs. Fix any carpet that is loose or worn. Avoid having throw rugs at the top or bottom of the stairs. If you do have throw rugs, attach them to the floor with carpet tape. Make sure that you have a light switch at the top of the stairs and the bottom of the stairs. If you do not have them, ask someone to add them for you. What else can I do to help prevent falls? Wear shoes that: Do not have high heels. Have rubber bottoms. Are comfortable and fit you well. Are closed at the toe. Do not wear sandals. If you use a stepladder: Make sure that it is fully opened. Do not climb a closed stepladder. Make sure that both sides of the  stepladder are locked into place. Ask someone to hold it for you, if possible. Clearly mark and make sure that you can see: Any grab bars or handrails. First and last steps. Where the edge of each step is. Use tools that help you move around (mobility aids) if they are needed. These include: Canes. Walkers. Scooters. Crutches. Turn on the lights when you go into a dark area. Replace any light bulbs as soon as they burn out. Set up your furniture so you have a clear path. Avoid moving your furniture around. If any of your floors are uneven, fix them. If there are any pets around you, be aware of where they are. Review your medicines with your doctor. Some medicines can make you feel dizzy. This can increase your chance of falling. Ask your doctor what other things that you can do to help prevent falls. This information is not intended to replace advice given to you by your health care provider. Make sure you discuss any questions you have with your health care provider. Document Released: 05/31/2009 Document Revised: 01/10/2016 Document Reviewed: 09/08/2014 Elsevier Interactive Patient Education  2017 [employer].

## 2022-10-21 NOTE — Progress Notes (Signed)
Subjective:   Molly Hodges is a 89 y.o. female who presents for Medicare Annual (Subsequent) preventive examination.  Review of Systems     Cardiac Risk Factors include: advanced age (>53mn, >>59women);dyslipidemia;hypertension     Objective:    Today's Vitals   10/21/22 1127  Weight: 124 lb 9.6 oz (56.5 kg)  Height: '5\' 5"'$  (1.651 m)   Body mass index is 20.73 kg/m.     10/21/2022   11:44 AM 01/26/2022    1:34 PM 10/15/2021    1:24 PM 08/23/2021    6:20 AM 08/07/2021    7:30 AM 08/06/2021    7:45 AM 08/02/2021    3:16 PM  Advanced Directives  Does Patient Have a Medical Advance Directive? Yes No No No No Yes Yes  Type of ATheatre stage managerof [place] will  Healthcare Power of [place]  Does patient want to make changes to medical advance directive?    No - Patient declined No - Patient declined No - Patient declined No - Patient declined  Copy of [place] Chart?    No - copy requested  No - copy requested No - copy requested  Would patient like information on creating a medical advance directive?   No - Patient declined No - Patient declined No - Patient declined      Current Medications (verified) Outpatient Encounter Medications as of 10/21/2022  Medication Sig   Calcium Carbonate-Vitamin D 600-400 MG-UNIT tablet Take 1 tablet by mouth daily.   cholecalciferol (VITAMIN D) 1000 UNITS tablet Take 1,000 Units by mouth daily.   clopidogrel (PLAVIX) 75 MG tablet TAKE 1 TABLET BY MOUTH DAILY   diltiazem (CARDIZEM CD) 180 MG 24 hr capsule TAKE 1 CAPSULE EVERY DAY   dorzolamide (TRUSOPT) 2 % ophthalmic solution Place 1 drop into both eyes 2 (two) times daily.    ezetimibe (ZETIA) 10 MG tablet TAKE 1 TABLET BY MOUTH DAILY.   hydrochlorothiazide (MICROZIDE) 12.5 MG capsule Take 12.5 mg by mouth daily.   ipratropium-albuterol (DUONEB) 0.5-2.5 (3) MG/3ML SOLN TAKE 3MLS BY NEBULIZATION EVERY  4 HOURS AS NEEDED   isosorbide mononitrate (IMDUR) 30 MG 24 hr tablet TAKE 1 TABLET BY MOUTH DAILY   latanoprost (XALATAN) 0.005 % ophthalmic solution Place 1 drop into both eyes at bedtime.    levalbuterol (XOPENEX HFA) 45 MCG/ACT inhaler Inhale 2 puffs into the lungs every 6 (six) hours as needed for wheezing or shortness of breath.   magnesium oxide (MAG-OX) 400 MG tablet Take 400 mg by mouth daily.   Multiple Vitamins-Minerals (PRESERVISION AREDS 2+MULTI VIT) CAPS Take 2 capsules by mouth daily.   mupirocin ointment (BACTROBAN) 2 % Apply 1 Application topically daily. With dressing changes (Patient not taking: Reported on 10/20/2022)   nitroGLYCERIN (NITROSTAT) 0.4 MG SL tablet Place 1 tablet (0.4 mg total) under the tongue every 5 (five) minutes as needed (for chest pain or shortness of breath).   potassium chloride (KLOR-CON) 10 MEQ tablet TAKE 2 TABLETS BY MOUTH DAILY. TAKE EXTRA 2 TABLETS WHEN TAKING EXTRA LASIX.   pregabalin (LYRICA) 50 MG capsule TAKE 1 CAPSULE BY MOUTH 2 TIMES DAILY   rosuvastatin (CRESTOR) 20 MG tablet TAKE 1 TABLET BY MOUTH DAILY.   silver sulfADIAZINE (SILVADENE) 1 % cream Apply 1 Application topically 2 (two) times daily.   Tiotropium Bromide-Olodaterol (STIOLTO RESPIMAT) 2.5-2.5 MCG/ACT AERS Inhale 2 puffs into the lungs daily.   torsemide (DEMADEX)  20 MG tablet TAKE 2 TABLETS BY MOUTH TWICE DAILY *DISCONTINUE FUROSEMIDE*   vitamin C (ASCORBIC ACID) 500 MG tablet Take 500 mg by mouth daily.   vitamin E 100 UNIT capsule Take 200 Units by mouth daily.   warfarin (COUMADIN) 2.5 MG tablet Take 1 1/2 tablets daily except take 1 tablet on Mondays, Wednesdays and Fridays or as directed by coumadin clinic.   No facility-administered encounter medications on file as of 10/21/2022.    Allergies (verified) Shellfish allergy, Azithromycin, Tamiflu [oseltamivir phosphate], and Albuterol   History: Past Medical History:  Diagnosis Date   Aortic atherosclerosis ([place])    a.  05/2021 TEE: GrIII atheroma plaque involving the asc, transverse, and desc Ao.   Asthma    CAD (coronary artery disease)    a. 04/2021 Cath: LM nl, LAD 85p/m, D1 80, RI nl, LCX nl, RCA nl; b. 07/2021 PCI: pLAD (2.75x26 Onyx Frontier DES), D1 (2.5x22 Onyx Frontier DES).   Carotid artery disease ([place])    a. s/p R carotid stenting (19m x 740mx 4cm long Exact stent); b. 06/2021 U/S: RICA [id number]  CHF (congestive heart failure) [place]   Community acquired pneumonia    COPD (chronic obstructive pulmonary disease) ([place]   Essential hypertension    Glaucoma    Hyperlipidemia    Mitral regurgitation    a. TTE 08/2015: EF 60-65%, normal wall motion, mild MR, mildly dilated left atrium measuring 40 mm, RVSF normal, PASP normal; b. 05/2021 TEE: Moderate MR.   Mitral stenosis    a. 05/2021 L/RHC: Sev MS w/ mean grad 13-1731m and MV area of 0.5-06.cm^2; b. 05/2021 TEE: EF 55-60%, no rwma, nl RV fxn, mod MR, mod MS (MV area by P1/2t: 1.61 cm^2 w/ mean grad of 9mm3m.   Peripheral neuropathy    Persistent atrial fibrillation (HCC)[place]/30/2017   a. s/p DCCV 11/15/2015; b. CHADS2VASc => 4 (HTN, age x 2, female)--> warfarin; c. 06/2021 recurrent afib-->amio added.   Squamous cell carcinoma of skin 12/18/2021   R dorsum hand, EDC   Squamous cell carcinoma of skin 12/31/2021   R hand dorsum, recurrent - excised 02/04/2022   Squamous cell carcinoma of skin 12/31/2021   L forearm - ED&C   Squamous cell carcinoma of skin 06/24/2022   R thumb webspace with wart virus - ED&C   Past Surgical History:  Procedure Laterality Date   ABDOMINAL HYSTERECTOMY  1987   due to heavy bleeding   APPENDECTOMY     CARDIAC CATHETERIZATION     CAROTID PTA/STENT INTERVENTION Right 08/15/2020   Procedure: CAROTID PTA/STENT INTERVENTION;  Surgeon: Dew,Algernon Huxley;  [facility];  Service: Cardiovascular;  Laterality: Right;   CATARACT EXTRACTION     CORONARY STENT INTERVENTION N/A 08/02/2021    Procedure: CORONARY STENT INTERVENTION;  Surgeon: End,Nelva Bush;  [facility];  Service: Cardiovascular;  Laterality: N/A;   ELECTROPHYSIOLOGIC STUDY N/A 11/15/2015   Procedure: CARDIOVERSION;  Surgeon: TimoMinna Merritts;  Location: [facility];  Service: Cardiovascular;  Laterality: N/A;   INTRAVASCULAR IMAGING/OCT N/A 08/02/2021   Procedure: INTRAVASCULAR IMAGING/OCT;  Surgeon: End,Nelva Bush;  [facility];  Service: Cardiovascular;  Laterality: N/A;   LEFT HEART CATH AND CORONARY ANGIOGRAPHY N/A 08/02/2021   Procedure: LEFT HEART CATH AND CORONARY ANGIOGRAPHY;  Surgeon: End,Nelva Bush;  [facility];  Service: Cardiovascular;  Laterality: N/A;   LEFT HEART CATH AND CORONARY  ANGIOGRAPHY N/A 08/07/2021   Procedure: LEFT HEART CATH AND CORONARY ANGIOGRAPHY;  Surgeon: Wellington Hampshire, MD;  Location: [facility];  Service: Cardiovascular;  Laterality: N/A;   RIGHT/LEFT HEART CATH AND CORONARY ANGIOGRAPHY N/A 05/17/2021   Procedure: RIGHT/LEFT HEART CATH AND CORONARY ANGIOGRAPHY;  Surgeon: Nelva Bush, MD;  Location: [facility];  Service: Cardiovascular;  Laterality: N/A;   TEE WITHOUT CARDIOVERSION N/A 06/13/2021   Procedure: TRANSESOPHAGEAL ECHOCARDIOGRAM (TEE);  Surgeon: Minna Merritts, MD;  Location: [facility];  Service: Cardiovascular;  Laterality: N/A;   Family History  Problem Relation Age of Onset   CAD Mother    CAD Father    Cancer Son 1       lung cancer   Social History   Socioeconomic History   Marital status: Married    Spouse name: Not on file   Number of children: 4   Years of education: Not on file   Highest education level: 12th grade  Occupational History   Occupation: retired  Tobacco Use   Smoking status: Never   Smokeless tobacco: Never   Tobacco comments:    never   Vaping Use   Vaping Use: Never used  Substance and Sexual Activity   Alcohol use: No   Drug use:  Never   Sexual activity: Not on file  Other Topics Concern   Not on file  Social History Narrative   Lives at home with Husband. Active and Independent at baseline.   Social Determinants of Health   Financial Resource Strain: Low Risk  (10/15/2021)   Overall Financial Resource Strain (CARDIA)    Difficulty of Paying Living Expenses: Not hard at all  Food Insecurity: No Food Insecurity (10/15/2021)   Hunger Vital Sign    Worried About Running Out of Food in the Last Year: Never true    Ran Out of Food in the Last Year: Never true  Transportation Needs: No Transportation Needs (10/21/2022)   PRAPARE - Hydrologist (Medical): No    Lack of Transportation (Non-Medical): No  Physical Activity: Inactive (10/21/2022)   Exercise Vital Sign    Days of Exercise per Week: 0 days    Minutes of Exercise per Session: 0 min  Stress: No Stress Concern Present (10/21/2022)   Century    Feeling of Stress : Not at all  Social Connections: Moderately Isolated (10/21/2022)   Social Connection and Isolation Panel [NHANES]    Frequency of Communication with Friends and Family: More than three times a week    Frequency of Social Gatherings with Friends and Family: More than three times a week    Attends Religious Services: Never    Marine scientist or Organizations: No    Attends Music therapist: Never    Marital Status: Married    Tobacco Counseling Counseling given: Not Answered Tobacco comments: never    Clinical Intake:  Pre-visit preparation completed: Yes  Pain : No/denies pain     BMI - recorded: 20.73 Nutritional Status: BMI of 19-24  Normal Nutritional Risks: None Diabetes: No  How often do you need to have someone help you when you read instructions, pamphlets, or other written materials from your doctor or pharmacy?: 1 - Never  Diabetic?NO  Interpreter Needed?:  No  Information entered by :: B.Terrelle Ruffolo,LPN   Activities of Daily Living    10/21/2022   11:45 AM 01/28/2022  3:55 PM  In your present state of health, do you have any difficulty performing the following activities:  Hearing? 0 0  Vision? 0 0  Difficulty concentrating or making decisions? 0 0  Walking or climbing stairs? 1 0  Dressing or bathing? 0 0  Doing errands, shopping? 1 0  Comment family helps   Preparing Food and eating ? N   Using the Toilet? N   In the past six months, have you accidently leaked urine? N   Do you have problems with loss of bowel control? N   Managing your Medications? N   Managing your Finances? N   Housekeeping or managing your Housekeeping? N     Patient Care Team: Birdie Sons, MD as PCP - General (Family Medicine) Rockey Situ Kathlene November, MD as PCP - Cardiology (Cardiology) Minna Merritts, MD as Consulting Physician (Cardiology) Leandrew Koyanagi, MD as Referring Physician (Ophthalmology) Lucky Cowboy Erskine Squibb, MD as Referring Physician (Vascular Surgery) Alfonso Patten, MD as Consulting Physician (Dermatology)  Indicate any recent Medical Services you may have received from other than [facility] providers in the past year (date may be approximate).     Assessment:   This is a routine wellness examination for Molly Hodges.  Hearing/Vision screen Hearing Screening - Comments:: Adequate hearing Vision Screening - Comments:: Adequate vision w/glasses Dr [facility]  Dietary issues and exercise activities discussed: Exercise limited by: neurologic condition(s);cardiac condition(s)   Goals Addressed             This Visit's Progress    DIET - EAT MORE FRUITS AND VEGETABLES   On track    DIET - INCREASE WATER INTAKE   On track    Recommend drinking 6-8 8 oz glasses of water a day.        Depression Screen    10/21/2022   11:41 AM 10/15/2021    1:20 PM 11/02/2020    1:09 PM 10/09/2020    1:28 PM 09/17/2020   10:12 AM 10/06/2019    11:22 AM 10/19/2017   11:12 AM  PHQ 2/9 Scores  PHQ - 2 Score 0 0 0 0 0 0 0  PHQ- 9 Score   0  0      Fall Risk    10/21/2022   11:35 AM 01/28/2022    3:55 PM 10/15/2021    1:26 PM 11/02/2020    1:09 PM 10/09/2020    1:29 PM  [place] in the past year? 0 0 0 0 0  Number falls in past yr: 0 0 0 0 0  Injury with Fall? 0 0 0 0 0  Risk for fall due to : Impaired balance/gait;No Fall Risks  No Fall Risks No Fall Risks   Follow up Education provided;Falls prevention discussed Falls evaluation completed Falls evaluation completed Falls evaluation completed     FALL RISK PREVENTION PERTAINING TO THE HOME:  Any stairs in or around the home? Yes  If so, are there any without handrails? Yes  Home free of loose throw rugs in walkways, pet beds, electrical cords, etc? Yes  Adequate lighting in your home to reduce risk of falls? Yes   ASSISTIVE DEVICES UTILIZED TO PREVENT FALLS:  Life alert? Yes  Use of a cane, walker or w/c? Yes  Grab bars in the bathroom? Yes  Shower chair or bench in shower? Yes  Elevated toilet seat or a handicapped toilet? Yes   TIMED UP AND GO:  Was the test performed? Yes .  Length of time to ambulate 10 feet: 13 sec.   Gait slow and steady without use of assistive device  Cognitive Function:        10/21/2022   11:46 AM 11/05/2016    3:15 PM  6CIT Screen  What Year? 0 points 0 points  What month? 0 points 0 points  What time? 0 points 0 points  Count back from 20 0 points 0 points  Months in reverse 0 points 4 points  Repeat phrase 2 points 0 points  Total Score 2 points 4 points    Immunizations Immunization History  Administered Date(s) Administered   Fluad Quad(high Dose 65+) 05/18/2019, 05/02/2020, 05/29/2021, 06/13/2022   Influenza, High Dose Seasonal PF 08/28/2017, 05/29/2018   PFIZER(Purple Top)SARS-COV-2 Vaccination 10/13/2019, 11/03/2019, 07/26/2020   Pneumococcal Conjugate-13 05/30/2014   Pneumococcal Polysaccharide-23 06/09/1985,  06/15/2006   Zoster Recombinat (Shingrix) 05/05/2018, 07/20/2018    TDAP status: Due, Education has been provided regarding the importance of this vaccine. Advised may receive this vaccine at local pharmacy or Health Dept. Aware to provide a copy of the vaccination record if obtained from local pharmacy or Health Dept. Verbalized acceptance and understanding.  Flu Vaccine status: Up to date  Pneumococcal vaccine status: Up to date  Covid-19 vaccine status: Completed vaccines  Qualifies for Shingles Vaccine? Yes   Zostavax completed No   Shingrix Completed?: No.    Education has been provided regarding the importance of this vaccine. Patient has been advised to call insurance company to determine out of pocket expense if they have not yet received this vaccine. Advised may also receive vaccine at local pharmacy or Health Dept. Verbalized acceptance and understanding.  Screening Tests Health Maintenance  Topic Date Due   DEXA SCAN  Never done   DTaP/Tdap/Td (1 - Tdap) Never done   COVID-19 Vaccine (4 - 2023-24 season) 04/18/2022   Medicare Annual Wellness (AWV)  10/21/2023   Pneumonia Vaccine 75+ Years old  Completed   INFLUENZA VACCINE  Completed   Zoster Vaccines- Shingrix  Completed   HPV VACCINES  Aged Out    Health Maintenance  Health Maintenance Due  Topic Date Due   DEXA SCAN  Never done   DTaP/Tdap/Td (1 - Tdap) Never done   COVID-19 Vaccine (4 - 2023-24 season) 04/18/2022    Colorectal cancer screening: No longer required.   Mammogram status: No longer required due to age.  Lung Cancer Screening: (Low Dose CT Chest recommended if Age 62-80 years, 30 pack-year currently smoking OR have quit w/in 15years.) does not qualify.   Lung Cancer Screening Referral: no  Additional Screening:  Hepatitis C Screening: does not qualify; Completed no  Vision Screening: Recommended annual ophthalmology exams for early detection of glaucoma and other disorders of the eye. Is  the patient up to date with their annual eye exam?  Yes  Who is the provider or what is the name of the office in which the patient attends annual eye exams? Dr Mordecai Rasmussen If pt is not established with a provider, would they like to be referred to a provider to establish care? Yes .   Dental Screening: Recommended annual dental exams for proper oral hygiene  Community Resource Referral / Chronic Care Management: CRR required this visit?  No   CCM required this visit?  No      Plan:     I have personally reviewed and noted the following in the patient's chart:   Medical and social history Use of alcohol, tobacco or  illicit drugs  Current medications and supplements including opioid prescriptions. Patient is not currently taking opioid prescriptions. Functional ability and status Nutritional status Physical activity Advanced directives List of other physicians Hospitalizations, surgeries, and ER visits in previous 12 months Vitals Screenings to include cognitive, depression, and falls Referrals and appointments  In addition, I have reviewed and discussed with patient certain preventive protocols, quality metrics, and best practice recommendations. A written personalized care plan for preventive services as well as general preventive health recommendations were provided to patient.     Roger Shelter, LPN   [id number]   Nurse Notes: pt is doing well at her baseline. Continues to manage her household, finances and care for husband.

## 2022-10-22 ENCOUNTER — Ambulatory Visit: Payer: Medicare Other | Attending: Cardiovascular Disease

## 2022-10-22 DIAGNOSIS — I480 Paroxysmal atrial fibrillation: Secondary | ICD-10-CM | POA: Diagnosis not present

## 2022-10-22 DIAGNOSIS — I050 Rheumatic mitral stenosis: Secondary | ICD-10-CM

## 2022-10-22 DIAGNOSIS — Z5181 Encounter for therapeutic drug level monitoring: Secondary | ICD-10-CM | POA: Diagnosis not present

## 2022-10-22 LAB — POCT INR: INR: 2 (ref 2.0–3.0)

## 2022-10-22 NOTE — Patient Instructions (Signed)
Description   Continue 1.5 tablets every day, EXCEPT 1 TABLET ON MONDAY, WEDNESDAY AND FRIDAY.   Recheck INR in 6 weeks

## 2022-10-23 ENCOUNTER — Ambulatory Visit: Payer: Medicare Other | Admitting: Dermatology

## 2022-10-23 ENCOUNTER — Telehealth: Payer: Self-pay

## 2022-10-23 NOTE — Telephone Encounter (Signed)
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

## 2022-10-23 NOTE — Telephone Encounter (Signed)
Patient informed of pathology results 

## 2022-10-24 NOTE — Progress Notes (Signed)
I have reviewed the health advisor's note, was available for consultation, and agree with documentation and plan  Darryl Willner, MD  

## 2022-10-25 ENCOUNTER — Encounter: Payer: Self-pay | Admitting: Dermatology

## 2022-10-30 ENCOUNTER — Emergency Department: Payer: Medicare Other

## 2022-10-30 ENCOUNTER — Inpatient Hospital Stay
Admission: EM | Admit: 2022-10-30 | Discharge: 2022-11-01 | DRG: 291 | Disposition: A | Discharge status: Home / Self Care | Payer: Medicare Other | Attending: Hospitalist | Admitting: Hospitalist

## 2022-10-30 ENCOUNTER — Other Ambulatory Visit: Payer: Self-pay

## 2022-10-30 DIAGNOSIS — Z8249 Family history of ischemic heart disease and other diseases of the circulatory system: Secondary | ICD-10-CM

## 2022-10-30 DIAGNOSIS — R0689 Other abnormalities of breathing: Secondary | ICD-10-CM | POA: Diagnosis not present

## 2022-10-30 DIAGNOSIS — Z1152 Encounter for screening for COVID-19: Secondary | ICD-10-CM | POA: Diagnosis not present

## 2022-10-30 DIAGNOSIS — Z881 Allergy status to other antibiotic agents status: Secondary | ICD-10-CM

## 2022-10-30 DIAGNOSIS — J441 Chronic obstructive pulmonary disease with (acute) exacerbation: Secondary | ICD-10-CM | POA: Diagnosis not present

## 2022-10-30 DIAGNOSIS — Z888 Allergy status to other drugs, medicaments and biological substances status: Secondary | ICD-10-CM

## 2022-10-30 DIAGNOSIS — J45901 Unspecified asthma with (acute) exacerbation: Secondary | ICD-10-CM | POA: Diagnosis present

## 2022-10-30 DIAGNOSIS — I480 Paroxysmal atrial fibrillation: Secondary | ICD-10-CM | POA: Diagnosis present

## 2022-10-30 DIAGNOSIS — I25118 Atherosclerotic heart disease of native coronary artery with other forms of angina pectoris: Secondary | ICD-10-CM | POA: Diagnosis present

## 2022-10-30 DIAGNOSIS — Z7901 Long term (current) use of anticoagulants: Secondary | ICD-10-CM

## 2022-10-30 DIAGNOSIS — J9600 Acute respiratory failure, unspecified whether with hypoxia or hypercapnia: Secondary | ICD-10-CM | POA: Diagnosis present

## 2022-10-30 DIAGNOSIS — Z7951 Long term (current) use of inhaled steroids: Secondary | ICD-10-CM

## 2022-10-30 DIAGNOSIS — R059 Cough, unspecified: Secondary | ICD-10-CM | POA: Diagnosis not present

## 2022-10-30 DIAGNOSIS — J9621 Acute and chronic respiratory failure with hypoxia: Secondary | ICD-10-CM | POA: Diagnosis present

## 2022-10-30 DIAGNOSIS — R0603 Acute respiratory distress: Secondary | ICD-10-CM | POA: Diagnosis not present

## 2022-10-30 DIAGNOSIS — I4819 Other persistent atrial fibrillation: Secondary | ICD-10-CM | POA: Diagnosis present

## 2022-10-30 DIAGNOSIS — Z79899 Other long term (current) drug therapy: Secondary | ICD-10-CM

## 2022-10-30 DIAGNOSIS — Z90710 Acquired absence of both cervix and uterus: Secondary | ICD-10-CM

## 2022-10-30 DIAGNOSIS — E785 Hyperlipidemia, unspecified: Secondary | ICD-10-CM | POA: Diagnosis present

## 2022-10-30 DIAGNOSIS — I110 Hypertensive heart disease with heart failure: Secondary | ICD-10-CM | POA: Diagnosis not present

## 2022-10-30 DIAGNOSIS — R079 Chest pain, unspecified: Secondary | ICD-10-CM | POA: Diagnosis not present

## 2022-10-30 DIAGNOSIS — Z7902 Long term (current) use of antithrombotics/antiplatelets: Secondary | ICD-10-CM

## 2022-10-30 DIAGNOSIS — I5033 Acute on chronic diastolic (congestive) heart failure: Secondary | ICD-10-CM | POA: Diagnosis not present

## 2022-10-30 DIAGNOSIS — D72828 Other elevated white blood cell count: Secondary | ICD-10-CM | POA: Diagnosis not present

## 2022-10-30 DIAGNOSIS — I2510 Atherosclerotic heart disease of native coronary artery without angina pectoris: Secondary | ICD-10-CM | POA: Diagnosis present

## 2022-10-30 DIAGNOSIS — I959 Hypotension, unspecified: Secondary | ICD-10-CM | POA: Diagnosis present

## 2022-10-30 DIAGNOSIS — Z743 Need for continuous supervision: Secondary | ICD-10-CM | POA: Diagnosis not present

## 2022-10-30 DIAGNOSIS — I10 Essential (primary) hypertension: Secondary | ICD-10-CM | POA: Diagnosis present

## 2022-10-30 DIAGNOSIS — Z91013 Allergy to seafood: Secondary | ICD-10-CM

## 2022-10-30 DIAGNOSIS — Z85828 Personal history of other malignant neoplasm of skin: Secondary | ICD-10-CM

## 2022-10-30 DIAGNOSIS — R6889 Other general symptoms and signs: Secondary | ICD-10-CM | POA: Diagnosis not present

## 2022-10-30 DIAGNOSIS — I482 Chronic atrial fibrillation, unspecified: Secondary | ICD-10-CM | POA: Diagnosis not present

## 2022-10-30 DIAGNOSIS — J209 Acute bronchitis, unspecified: Secondary | ICD-10-CM | POA: Diagnosis present

## 2022-10-30 DIAGNOSIS — Z801 Family history of malignant neoplasm of trachea, bronchus and lung: Secondary | ICD-10-CM

## 2022-10-30 DIAGNOSIS — J9811 Atelectasis: Secondary | ICD-10-CM | POA: Diagnosis not present

## 2022-10-30 DIAGNOSIS — I5031 Acute diastolic (congestive) heart failure: Secondary | ICD-10-CM | POA: Diagnosis not present

## 2022-10-30 DIAGNOSIS — J90 Pleural effusion, not elsewhere classified: Secondary | ICD-10-CM | POA: Diagnosis not present

## 2022-10-30 LAB — BLOOD GAS, VENOUS
Acid-Base Excess: 6 mmol/L — ABNORMAL HIGH (ref 0.0–2.0)
Bicarbonate: 31.2 mmol/L — ABNORMAL HIGH (ref 20.0–28.0)
O2 Saturation: 32.4 %
Patient temperature: 37
pCO2, Ven: 47 mmHg (ref 44–60)
pH, Ven: 7.43 (ref 7.25–7.43)
pO2, Ven: <31 mmHg — CL (ref 32–45)

## 2022-10-30 LAB — PROTIME-INR
INR: 2.3 — ABNORMAL HIGH (ref 0.8–1.2)
Prothrombin Time: 24.9 seconds — ABNORMAL HIGH (ref 11.4–15.2)

## 2022-10-30 LAB — CBC WITH DIFFERENTIAL/PLATELET
Abs Immature Granulocytes: 0.04 10*3/uL (ref 0.00–0.07)
Basophils Absolute: 0 10*3/uL (ref 0.0–0.1)
Basophils Relative: 0 %
Eosinophils Absolute: 0.1 10*3/uL (ref 0.0–0.5)
Eosinophils Relative: 1 %
HCT: 39.6 % (ref 36.0–46.0)
Hemoglobin: 12.4 g/dL (ref 12.0–15.0)
Immature Granulocytes: 0 %
Lymphocytes Relative: 10 %
Lymphs Abs: 1.1 10*3/uL (ref 0.7–4.0)
MCH: 24.2 pg — ABNORMAL LOW (ref 26.0–34.0)
MCHC: 31.3 g/dL (ref 30.0–36.0)
MCV: 77.2 fL — ABNORMAL LOW (ref 80.0–100.0)
Monocytes Absolute: 1.1 10*3/uL — ABNORMAL HIGH (ref 0.1–1.0)
Monocytes Relative: 10 %
Neutro Abs: 9 10*3/uL — ABNORMAL HIGH (ref 1.7–7.7)
Neutrophils Relative %: 79 %
Platelets: 146 10*3/uL — ABNORMAL LOW (ref 150–400)
RBC: 5.13 MIL/uL — ABNORMAL HIGH (ref 3.87–5.11)
RDW: 17.9 % — ABNORMAL HIGH (ref 11.5–15.5)
WBC: 11.3 10*3/uL — ABNORMAL HIGH (ref 4.0–10.5)
nRBC: 0 % (ref 0.0–0.2)

## 2022-10-30 LAB — BASIC METABOLIC PANEL
Anion gap: 8 (ref 5–15)
BUN: 17 mg/dL (ref 8–23)
CO2: 24 mmol/L (ref 22–32)
Calcium: 8.5 mg/dL — ABNORMAL LOW (ref 8.9–10.3)
Chloride: 106 mmol/L (ref 98–111)
Creatinine, Ser: 0.94 mg/dL (ref 0.44–1.00)
GFR, Estimated: 58 mL/min — ABNORMAL LOW (ref >60)
Glucose, Bld: 126 mg/dL — ABNORMAL HIGH (ref 70–99)
Potassium: 3.4 mmol/L — ABNORMAL LOW (ref 3.5–5.1)
Sodium: 138 mmol/L (ref 135–145)

## 2022-10-30 LAB — TROPONIN I (HIGH SENSITIVITY)
Troponin I (High Sensitivity): 14 ng/L (ref <18)
Troponin I (High Sensitivity): 15 ng/L (ref <18)

## 2022-10-30 LAB — RESP PANEL BY RT-PCR (RSV, FLU A&B, COVID)  RVPGX2
Influenza A by PCR: NEGATIVE
Influenza B by PCR: NEGATIVE
Resp Syncytial Virus by PCR: NEGATIVE
SARS Coronavirus 2 by RT PCR: NEGATIVE

## 2022-10-30 LAB — BRAIN NATRIURETIC PEPTIDE: B Natriuretic Peptide: 694.8 pg/mL — ABNORMAL HIGH (ref 0.0–100.0)

## 2022-10-30 MED ORDER — SODIUM CHLORIDE 0.9% FLUSH
3.0000 mL | Freq: Two times a day (BID) | INTRAVENOUS | Status: DC
  Administered 2022-10-30 – 2022-11-01 (×5): 3 mL via INTRAVENOUS

## 2022-10-30 MED ORDER — VITAMIN D 25 MCG (1000 UNIT) PO TABS
1000.0000 [IU] | ORAL_TABLET | Freq: Every day | ORAL | Status: DC
  Administered 2022-10-30 – 2022-11-01 (×3): 1000 [IU] via ORAL
  Filled 2022-10-30 (×3): qty 1

## 2022-10-30 MED ORDER — IPRATROPIUM-ALBUTEROL 0.5-2.5 (3) MG/3ML IN SOLN
3.0000 mL | Freq: Once | RESPIRATORY_TRACT | Status: AC
  Administered 2022-10-30: 3 mL via RESPIRATORY_TRACT
  Filled 2022-10-30: qty 3

## 2022-10-30 MED ORDER — WARFARIN - PHARMACIST DOSING INPATIENT
Freq: Every day | Status: DC
  Filled 2022-10-30: qty 1

## 2022-10-30 MED ORDER — ONDANSETRON HCL 4 MG/2ML IJ SOLN: 4.0000 mg | Freq: Four times a day (QID) | INTRAMUSCULAR | Status: DC | PRN

## 2022-10-30 MED ORDER — PREGABALIN 50 MG PO CAPS
50.0000 mg | ORAL_CAPSULE | Freq: Two times a day (BID) | ORAL | Status: DC
  Administered 2022-10-30 – 2022-11-01 (×5): 50 mg via ORAL
  Filled 2022-10-30 (×5): qty 1

## 2022-10-30 MED ORDER — ONDANSETRON HCL 4 MG PO TABS: 4.0000 mg | ORAL_TABLET | Freq: Four times a day (QID) | ORAL | Status: DC | PRN

## 2022-10-30 MED ORDER — VITAMIN E 45 MG (100 UNIT) PO CAPS
200.0000 [IU] | ORAL_CAPSULE | Freq: Every day | ORAL | Status: DC
  Administered 2022-10-31 – 2022-11-01 (×2): 200 [IU] via ORAL
  Filled 2022-10-30 (×2): qty 2

## 2022-10-30 MED ORDER — ENOXAPARIN SODIUM 40 MG/0.4ML IJ SOSY
40.0000 mg | PREFILLED_SYRINGE | INTRAMUSCULAR | Status: DC
  Administered 2022-10-30: 40 mg via SUBCUTANEOUS
  Filled 2022-10-30: qty 0.4

## 2022-10-30 MED ORDER — ALBUTEROL SULFATE (2.5 MG/3ML) 0.083% IN NEBU: 2.5000 mg | INHALATION_SOLUTION | RESPIRATORY_TRACT | Status: DC | PRN

## 2022-10-30 MED ORDER — SODIUM CHLORIDE 0.9 % IV SOLN
1.0000 g | INTRAVENOUS | Status: DC
  Administered 2022-10-30 – 2022-10-31 (×2): 1 g via INTRAVENOUS
  Filled 2022-10-30: qty 1
  Filled 2022-10-30: qty 10

## 2022-10-30 MED ORDER — ISOSORBIDE MONONITRATE ER 30 MG PO TB24
30.0000 mg | ORAL_TABLET | Freq: Every day | ORAL | Status: DC
  Administered 2022-10-30: 30 mg via ORAL
  Filled 2022-10-30: qty 1

## 2022-10-30 MED ORDER — TORSEMIDE 20 MG PO TABS
40.0000 mg | ORAL_TABLET | Freq: Two times a day (BID) | ORAL | Status: DC
  Administered 2022-10-30: 40 mg via ORAL
  Filled 2022-10-30: qty 2

## 2022-10-30 MED ORDER — HYDROCHLOROTHIAZIDE 12.5 MG PO TABS
12.5000 mg | ORAL_TABLET | Freq: Every day | ORAL | Status: DC
  Administered 2022-10-30: 12.5 mg via ORAL
  Filled 2022-10-30: qty 1

## 2022-10-30 MED ORDER — SODIUM CHLORIDE 0.9 % IV SOLN: 250.0000 mL | INTRAVENOUS | Status: DC | PRN

## 2022-10-30 MED ORDER — GUAIFENESIN-DM 100-10 MG/5ML PO SYRP
5.0000 mL | ORAL_SOLUTION | ORAL | Status: DC | PRN
  Administered 2022-10-30: 5 mL via ORAL
  Filled 2022-10-30: qty 10

## 2022-10-30 MED ORDER — DILTIAZEM HCL ER COATED BEADS 180 MG PO CP24
180.0000 mg | ORAL_CAPSULE | Freq: Every day | ORAL | Status: DC
  Administered 2022-10-30: 180 mg via ORAL
  Filled 2022-10-30 (×2): qty 1

## 2022-10-30 MED ORDER — ROSUVASTATIN CALCIUM 10 MG PO TABS
20.0000 mg | ORAL_TABLET | Freq: Every day | ORAL | Status: DC
  Administered 2022-10-30 – 2022-11-01 (×3): 20 mg via ORAL
  Filled 2022-10-30 (×4): qty 2

## 2022-10-30 MED ORDER — PROSIGHT PO TABS
2.0000 | ORAL_TABLET | Freq: Every day | ORAL | Status: DC
  Administered 2022-10-31 – 2022-11-01 (×2): 2 via ORAL
  Filled 2022-10-30 (×2): qty 2

## 2022-10-30 MED ORDER — POTASSIUM CHLORIDE CRYS ER 20 MEQ PO TBCR
10.0000 meq | EXTENDED_RELEASE_TABLET | Freq: Every day | ORAL | Status: DC
  Administered 2022-10-30 – 2022-11-01 (×3): 10 meq via ORAL
  Filled 2022-10-30 (×3): qty 1

## 2022-10-30 MED ORDER — PREDNISONE 20 MG PO TABS
60.0000 mg | ORAL_TABLET | Freq: Once | ORAL | Status: AC
  Administered 2022-10-30: 60 mg via ORAL
  Filled 2022-10-30: qty 3

## 2022-10-30 MED ORDER — FUROSEMIDE 10 MG/ML IJ SOLN: 60.0000 mg | Freq: Once | INTRAMUSCULAR | Status: DC

## 2022-10-30 MED ORDER — OYSTER SHELL CALCIUM/D3 500-5 MG-MCG PO TABS
1.0000 | ORAL_TABLET | Freq: Every day | ORAL | Status: DC
  Administered 2022-10-31 – 2022-11-01 (×2): 1 via ORAL
  Filled 2022-10-30 (×2): qty 1

## 2022-10-30 MED ORDER — NITROGLYCERIN 0.4 MG SL SUBL: 0.4000 mg | SUBLINGUAL_TABLET | SUBLINGUAL | Status: DC | PRN

## 2022-10-30 MED ORDER — POTASSIUM CHLORIDE CRYS ER 20 MEQ PO TBCR
40.0000 meq | EXTENDED_RELEASE_TABLET | Freq: Once | ORAL | Status: AC
  Administered 2022-10-30: 40 meq via ORAL
  Filled 2022-10-30: qty 2

## 2022-10-30 MED ORDER — PREDNISONE 20 MG PO TABS
40.0000 mg | ORAL_TABLET | Freq: Every day | ORAL | Status: DC
  Administered 2022-10-31 – 2022-11-01 (×2): 40 mg via ORAL
  Filled 2022-10-30 (×2): qty 2

## 2022-10-30 MED ORDER — SODIUM CHLORIDE 0.9% FLUSH
3.0000 mL | INTRAVENOUS | Status: DC | PRN
  Administered 2022-10-31: 3 mL via INTRAVENOUS

## 2022-10-30 MED ORDER — CLOPIDOGREL BISULFATE 75 MG PO TABS
75.0000 mg | ORAL_TABLET | Freq: Every day | ORAL | Status: DC
  Administered 2022-10-30 – 2022-11-01 (×3): 75 mg via ORAL
  Filled 2022-10-30 (×3): qty 1

## 2022-10-30 MED ORDER — IPRATROPIUM-ALBUTEROL 0.5-2.5 (3) MG/3ML IN SOLN
3.0000 mL | RESPIRATORY_TRACT | Status: DC | PRN
  Administered 2022-10-31 (×2): 3 mL via RESPIRATORY_TRACT
  Filled 2022-10-30 (×2): qty 3

## 2022-10-30 MED ORDER — FUROSEMIDE 10 MG/ML IJ SOLN
80.0000 mg | Freq: Once | INTRAMUSCULAR | Status: AC
  Administered 2022-10-30: 80 mg via INTRAVENOUS
  Filled 2022-10-30: qty 8

## 2022-10-30 MED ORDER — WARFARIN SODIUM 2.5 MG PO TABS
3.7500 mg | ORAL_TABLET | ORAL | Status: AC
  Administered 2022-10-30: 3.75 mg via ORAL
  Filled 2022-10-30: qty 1

## 2022-10-30 MED ORDER — VITAMIN C 500 MG PO TABS
500.0000 mg | ORAL_TABLET | Freq: Every day | ORAL | Status: DC
  Administered 2022-10-31 – 2022-11-01 (×2): 500 mg via ORAL
  Filled 2022-10-30 (×2): qty 1

## 2022-10-30 NOTE — Assessment & Plan Note (Signed)
Acute COPD exacerbation with increased cough, wheezing, sputum production Started on prednisone in the ER Will continue As needed DuoNebs and IV Rocephin for bronchitic coverage Follow

## 2022-10-30 NOTE — ED Notes (Signed)
Pt was not able to use the external catheter. Pt ambulated to restroom, pt had a steady, even gait. NAD noted.

## 2022-10-30 NOTE — Assessment & Plan Note (Signed)
Decompensated respiratory status in the setting of concomitant COPD and CHF flares BNP 695 with chest x-ray concerning for CHF Noted worsening wheezing, cough, sputum production despite home inhalers Status post a milligrams IV Lasix in the ER as well as oral prednisone, DuoNebs Will add on IV Rocephin for COPD coverage Monitor urine output As needed DuoNebs Supplemental oxygen as needed Strict Is and Os and daily weights

## 2022-10-30 NOTE — Assessment & Plan Note (Signed)
BP stable Titrate home regimen with diuresis

## 2022-10-30 NOTE — ED Triage Notes (Signed)
Congested cough x 2 days. Hx of asthma. Reports SOB onset today without relief of home meds. Congested cough with sputum production noted. Expiratory wheeze and crackles noted bilaterally. Duoneb given en route with EMS. Pt alert and oriented on arrival. Denies chest pain or pressure. EKG obtained.

## 2022-10-30 NOTE — Assessment & Plan Note (Addendum)
2D echo 05/2021 with EF of 50 to 55% Acute decompensation of baseline diastolic dysfunction BNP [id number] with chest x-ray concerning for CHF Status post 60 mg IV Lasix in ER Restart home torsemide regimen Strict ins and outs and daily weights Repeat 2D echo

## 2022-10-30 NOTE — Assessment & Plan Note (Addendum)
No active CP  Trop neg x 2 Cont home regimen

## 2022-10-30 NOTE — Assessment & Plan Note (Signed)
>>  ASSESSMENT AND PLAN FOR CORONARY ARTERY DISEASE OF NATIVE ARTERY OF NATIVE HEART WITH STABLE ANGINA PECTORIS (HCC) WRITTEN ON 10/30/2022  1:55 PM BY NEWTON, Francoise Schaumann, MD  No active CP  Trop neg x 2 Cont home regimen

## 2022-10-30 NOTE — H&P (Signed)
History and Physical    Patient: Molly Hodges [id number] DOB: [birth year] DOA: 10/30/2022 DOS: the patient was seen and examined on 10/30/2022 PCP: Birdie Sons, MD  Patient coming from: Home  Chief Complaint:  Chief Complaint  Patient presents with   Shortness of Breath   HPI: Molly Hodges is a 89 y.o. female with medical history significant of COPD, chronic diastolic CHF, coronary artery disease, hypertension, hyperlipidemia, mitral regurgitation presenting with acute respiratory failure, CHF and COPD exacerbations.  Patient reports progressively worsening increased work of breathing with past 2 days.  No chest pain.  Positive cough and increased sputum production.  Noted questionable sick contact with husband who has had a chronic cough also.  No fevers or chills.  Has been using home inhalers with minimal improvement in symptoms.  Positive orthopnea and PND.  Positive mild lower extremity swelling.  No reports of eating salty foods or NSAID use. Presented to the ER Tmax 89.2, BP stable, satting in the mid 90s on room air.  White count 9.3, hemoglobin 12.4, platelets 146.  Troponin negative x 2.  Creatinine 0.94, potassium 3.4.  BNP of 695.  COVID flu and RSV negative.  Chest x-ray with mild CHF. Review of Systems: As mentioned in the history of present illness. All other systems reviewed and are negative. Past Medical History:  Diagnosis Date   Aortic atherosclerosis ([place])    a. 05/2021 TEE: GrIII atheroma plaque involving the asc, transverse, and desc Ao.   Asthma    CAD (coronary artery disease)    a. 04/2021 Cath: LM nl, LAD 85p/m, D1 80, RI nl, LCX nl, RCA nl; b. 07/2021 PCI: pLAD (2.75x26 Onyx Frontier DES), D1 (2.5x22 Onyx Frontier DES).   Carotid artery disease ([place])    a. s/p R carotid stenting (91m x 740mx 4cm long Exact stent); b. 06/2021 U/S: RICA [id number]  CH[place]congestive [place]   Community acquired pneumonia    COPD (chronic  obstructive pulmonary disease) [place]   Essential hypertension    Glaucoma    Hyperlipidemia    Mitral regurgitation    a. TTE 08/2015: EF 60-65%, normal wall motion, mild MR, mildly dilated left atrium measuring 40 mm, RVSF normal, PASP normal; b. 05/2021 TEE: Moderate MR.   Mitral stenosis    a. 05/2021 L/RHC: Sev MS w/ mean grad 13-1715m and MV area of 0.5-06.cm^2; b. 05/2021 TEE: EF 55-60%, no rwma, nl RV fxn, mod MR, mod MS (MV area by P1/2t: 1.61 cm^2 w/ mean grad of 9mm36m.   Peripheral neuropathy    Persistent atrial fibrillation (HCC)[place]/30/2017   a. s/p DCCV 11/15/2015; b. CHADS2VASc => 4 (HTN, age x 2, female)--> warfarin; c. 06/2021 recurrent afib-->amio added.   Squamous cell carcinoma of skin 12/18/2021   R dorsum hand, EDC   Squamous cell carcinoma of skin 12/31/2021   R hand dorsum, recurrent - excised 02/04/2022   Squamous cell carcinoma of skin 12/31/2021   L forearm - ED&C   Squamous cell carcinoma of skin 06/24/2022   R thumb webspace with wart virus - ED&C   Squamous cell carcinoma of skin 10/20/2022   L lat knee - tx with ED&C   Past Surgical History:  Procedure Laterality Date   ABDOMINAL HYSTERECTOMY  1987   due to heavy bleeding   APPENDECTOMY     CARDIAC CATHETERIZATION     CAROTID PTA/STENT INTERVENTION Right 08/15/2020   Procedure: CAROTID PTA/STENT INTERVENTION;  Surgeon: Algernon Huxley, MD;  Location: [place] [facility];  Service: Cardiovascular;  Laterality: Right;   CATARACT EXTRACTION     CORONARY STENT INTERVENTION N/A 08/02/2021   Procedure: CORONARY STENT INTERVENTION;  Surgeon: Nelva Bush, MD;  Location: [facility];  Service: Cardiovascular;  Laterality: N/A;   ELECTROPHYSIOLOGIC STUDY N/A 11/15/2015   Procedure: CARDIOVERSION;  Surgeon: Minna Merritts, MD;  Location: [facility];  Service: Cardiovascular;  Laterality: N/A;   INTRAVASCULAR IMAGING/OCT N/A 08/02/2021   Procedure: INTRAVASCULAR IMAGING/OCT;  Surgeon: Nelva Bush,  MD;  Location: [facility];  Service: Cardiovascular;  Laterality: N/A;   LEFT HEART CATH AND CORONARY ANGIOGRAPHY N/A 08/02/2021   Procedure: LEFT HEART CATH AND CORONARY ANGIOGRAPHY;  Surgeon: Nelva Bush, MD;  Location: [facility];  Service: Cardiovascular;  Laterality: N/A;   LEFT HEART CATH AND CORONARY ANGIOGRAPHY N/A 08/07/2021   Procedure: LEFT HEART CATH AND CORONARY ANGIOGRAPHY;  Surgeon: Wellington Hampshire, MD;  Location: [place];  Service: Cardiovascular;  Laterality: N/A;   RIGHT/LEFT HEART CATH AND CORONARY ANGIOGRAPHY N/A 05/17/2021   Procedure: RIGHT/LEFT HEART CATH AND CORONARY ANGIOGRAPHY;  Surgeon: Nelva Bush, MD;  Location: [facility];  Service: Cardiovascular;  Laterality: N/A;   TEE WITHOUT CARDIOVERSION N/A 06/13/2021   Procedure: TRANSESOPHAGEAL ECHOCARDIOGRAM (TEE);  Surgeon: Minna Merritts, MD;  Location: [facility];  Service: Cardiovascular;  Laterality: N/A;   Social History:  reports that she has never smoked. She has never used smokeless tobacco. She reports that she does not drink alcohol and does not use drugs.  Allergies  Allergen Reactions   Shellfish Allergy Anaphylaxis    Other reaction(s): Hallucination   Azithromycin Other (See Comments)    Extreme burning sensation at IV site    Tamiflu [Oseltamivir Phosphate] Other (See Comments)    Reaction:  Hallucinations     Albuterol Palpitations    Heart racing.     Family History  Problem Relation Age of Onset   CAD Mother    CAD Father    Cancer Son 8       lung cancer    Prior to Admission medications   Medication Sig Start Date End Date Taking? Authorizing Provider  Calcium Carbonate-Vitamin D 600-400 MG-UNIT tablet Take 1 tablet by mouth daily.   Yes [provider]  cholecalciferol (VITAMIN D) 1000 UNITS tablet Take 1,000 Units by mouth daily.   Yes [provider]  clopidogrel (PLAVIX) 75 MG tablet TAKE 1 TABLET BY MOUTH DAILY  07/14/22  Yes Gollan, Kathlene November, MD  diltiazem (CARDIZEM CD) 180 MG 24 hr capsule TAKE 1 CAPSULE EVERY DAY 06/23/22  Yes Birdie Sons, MD  dorzolamide (TRUSOPT) 2 % ophthalmic solution Place 1 drop into both eyes 2 (two) times daily.    Yes [provider]  ezetimibe (ZETIA) 10 MG tablet TAKE 1 TABLET BY MOUTH DAILY. 10/06/22  Yes Gollan, Kathlene November, MD  hydrochlorothiazide (MICROZIDE) 12.5 MG capsule Take 12.5 mg by mouth daily. 10/21/21  Yes [provider]  ipratropium-albuterol (DUONEB) 0.5-2.5 (3) MG/3ML SOLN TAKE 3MLS BY NEBULIZATION EVERY 4 HOURS AS NEEDED 11/12/21  Yes Birdie Sons, MD  isosorbide mononitrate (IMDUR) 30 MG 24 hr tablet TAKE 1 TABLET BY MOUTH DAILY 05/30/22  Yes Gollan, Kathlene November, MD  latanoprost (XALATAN) 0.005 % ophthalmic solution Place 1 drop into both eyes at bedtime.    Yes [provider]  levalbuterol Penne Lash HFA) 45 MCG/ACT inhaler  Inhale 2 puffs into the lungs every 6 (six) hours as needed for wheezing or shortness of breath. 07/25/22  Yes Birdie Sons, MD  magnesium oxide (MAG-OX) 400 MG tablet Take 400 mg by mouth daily.   Yes [provider]  Multiple Vitamins-Minerals (PRESERVISION AREDS 2+MULTI VIT) CAPS Take 2 capsules by mouth daily.   Yes [provider]  nitroGLYCERIN (NITROSTAT) 0.4 MG SL tablet Place 1 tablet (0.4 mg total) under the tongue every 5 (five) minutes as needed (for chest pain or shortness of breath). 05/17/21 10/30/22 Yes End, Harrell Gave, MD  potassium chloride (KLOR-CON) 10 MEQ tablet TAKE 2 TABLETS BY MOUTH DAILY. TAKE EXTRA 2 TABLETS WHEN TAKING EXTRA LASIX. 07/14/22  Yes Gollan, Kathlene November, MD  pregabalin (LYRICA) 50 MG capsule TAKE 1 CAPSULE BY MOUTH 2 TIMES DAILY 07/13/22  Yes Birdie Sons, MD  rosuvastatin (CRESTOR) 20 MG tablet TAKE 1 TABLET BY MOUTH DAILY. 07/14/22  Yes Gollan, Kathlene November, MD  silver sulfADIAZINE (SILVADENE) 1 % cream Apply 1 Application topically 2 (two) times daily.  02/25/22  Yes Moye, [place], MD  Tiotropium Bromide-Olodaterol (STIOLTO RESPIMAT) 2.5-2.5 MCG/ACT AERS Inhale 2 puffs into the lungs daily. 01/28/22  Yes Darylene Price A, FNP  torsemide (DEMADEX) 20 MG tablet TAKE 2 TABLETS BY MOUTH TWICE DAILY *DISCONTINUE FUROSEMIDE* 09/16/22  Yes Birdie Sons, MD  vitamin C (ASCORBIC ACID) 500 MG tablet Take 500 mg by mouth daily.   Yes [provider]  vitamin E 100 UNIT capsule Take 200 Units by mouth daily.   Yes [provider]  warfarin (COUMADIN) 2.5 MG tablet Take 1 1/2 tablets daily except take 1 tablet on Mondays, Wednesdays and Fridays or as directed by coumadin clinic. 07/21/22  Yes Minna Merritts, MD  mupirocin ointment (BACTROBAN) 2 % Apply 1 Application topically daily. With dressing changes Patient not taking: Reported on 10/20/2022 02/04/22   Ralene Bathe, MD    Physical Exam: Vitals:   10/30/22 1200 10/30/22 1230 10/30/22 1300 10/30/22 1330  BP: (!) 149/90 (!) 145/66 (!) 120/49 (!) 140/65  Pulse: 84 78 77 79  Resp: (!) '27 17 20 15  '$ Temp:      TempSrc:      SpO2: 100% 96% 95% 95%  Weight:      Height:       Physical Exam Constitutional:      General: She is not in acute distress.    Appearance: She is normal weight.  HENT:     Head: Normocephalic and atraumatic.     Mouth/Throat:     Mouth: Mucous membranes are moist.  Eyes:     Pupils: Pupils are equal, round, and reactive to light.  Cardiovascular:     Rate and Rhythm: Normal rate.  Pulmonary:     Effort: Pulmonary effort is normal.  Abdominal:     General: Bowel sounds are normal.  Musculoskeletal:        General: Normal range of motion.     Cervical back: Normal range of motion.  Skin:    General: Skin is warm.  Neurological:     General: No focal deficit present.  Psychiatric:        Mood and Affect: Mood normal.     Data Reviewed:  There are no new results to review at this time. DG Chest 2 View CLINICAL DATA:  Chest pain.  Cough  and congestion.  EXAM: CHEST - 2 VIEW  COMPARISON:  01/26/22  FINDINGS: Mild cardiac  enlargement. Aortic atherosclerosis. Small bilateral pleural effusions and mild interstitial edema. Atelectasis noted in the lung bases. No airspace disease.  IMPRESSION: Mild congestive heart failure.  Electronically Signed   By: Kerby Moors M.D.   On: 10/30/2022 07:42  Lab Results  Component Value Date   WBC 11.3 (H) 10/30/2022   HGB 12.4 10/30/2022   HCT 39.6 10/30/2022   MCV 77.2 (L) 10/30/2022   PLT 146 (L) 99991111   Last metabolic panel Lab Results  Component Value Date   GLUCOSE 126 (H) 10/30/2022   NA 138 10/30/2022   K 3.4 (L) 10/30/2022   CL 106 10/30/2022   CO2 24 10/30/2022   BUN 17 10/30/2022   CREATININE 0.94 10/30/2022   GFRNONAA 58 (L) 10/30/2022   CALCIUM 8.5 (L) 10/30/2022   PHOS 3.7 11/30/2019   PROT 6.6 08/23/2021   ALBUMIN 3.3 (L) 08/23/2021   LABGLOB 2.4 05/30/2021   AGRATIO 1.8 05/30/2021   BILITOT 0.9 08/23/2021   ALKPHOS 84 08/23/2021   AST 48 (H) 08/23/2021   ALT 64 (H) 08/23/2021   ANIONGAP 8 10/30/2022    Assessment and Plan: * Acute respiratory failure (HCC) Decompensated respiratory status in the setting of concomitant COPD and CHF flares BNP 695 with chest x-ray concerning for CHF Noted worsening wheezing, cough, sputum production despite home inhalers Status post a milligrams IV Lasix in the ER as well as oral prednisone, DuoNebs Will add on IV Rocephin for COPD coverage Monitor urine output As needed DuoNebs Supplemental oxygen as needed Strict Is and Os and daily weights    COPD with acute bronchitis (HCC) Acute COPD exacerbation with increased cough, wheezing, sputum production Started on prednisone in the ER Will continue As needed DuoNebs and IV Rocephin for bronchitic coverage Follow   Acute on chronic diastolic CHF (congestive heart failure) ([place]) 2D echo 05/2021 with EF of 50 to 55% Acute decompensation of baseline  diastolic dysfunction BNP [id number] with chest x-ray concerning for CHF Status post 60 mg IV Lasix in ER Restart home torsemide regimen Strict ins and outs and daily weights Repeat 2D echo   Coronary artery disease of native artery of native heart with stable angina pectoris (HCC) No active CP  Trop neg x 2 Cont home regimen    Hyperlipidemia Stable  Cont home crestor   Paroxysmal atrial fibrillation (HCC) On coumadin  Cont per pharmacy  Cont home rate controlling agents     Hypertension BP stable Titrate home regimen with diuresis      Advance Care Planning:   Code Status: Full Code   Consults: None   Family Communication: No family at the bedside   Severity of Illness: The appropriate patient status for this patient is OBSERVATION. Observation status is judged to be reasonable and necessary in order to provide the required intensity of service to ensure the patient's safety. The patient's presenting symptoms, physical exam findings, and initial radiographic and laboratory data in the context of their medical condition is felt to place them at decreased risk for further clinical deterioration. Furthermore, it is anticipated that the patient will be medically stable for discharge from the hospital within 2 midnights of admission.   Author: Deneise Lever, MD 10/30/2022 2:01 PM  For on call review [email or web address].

## 2022-10-30 NOTE — ED Provider Notes (Signed)
7:25 AM Assumed care for off going team.   Blood pressure (!) 152/56, pulse 74, temperature 99.2 F (37.3 C), temperature source Oral, resp. rate 14, height '5\' 5"'$  (1.651 m), weight 56.2 kg, SpO2 98 %.  See their HPI for full report but in brief pending labs-pt improved with duoneb anticipate dc home if workup re-assuring  8:48 AM reevaluated patient resting comfortably in bed with sats of 98%   Initial troponin is negative BMP shows slightly low potassium we will give some oral repletion BNP slightly elevated but downtrending from 9 months ago.  CBC reassuring slightly elevated white count VBG normal CO2  Chest x-ray personally reviewed and interpreted and some mild pulmonary edema  Reevaluated patient who reports feeling much better.  We discussed admission for CHF   Will give a dose of IV Lasix and monitor patient and do ambulatory saturation.    Patient has urinated 2 times spent 2 hours since Lasix was given but we ambulated her and respiratory rate goes up into the 30s and oxygen levels go down to the 90s she does report being more winded than her typical therefore she is willing to be admitted for CHF exacerbation     Vanessa [place], MD 10/30/22 1112

## 2022-10-30 NOTE — Assessment & Plan Note (Signed)
Stable  Cont home crestor

## 2022-10-30 NOTE — ED Provider Notes (Signed)
[facility] Provider Note    Event Date/Time   First MD Initiated Contact with Patient 10/30/22 [phone]     (approximate)   History   Shortness of Breath   HPI  Molly Hodges is a 89 y.o. female   Past medical history of a fibrillation on Eliquis, asthma, CHF, COPD not on home oxygen, presents to the emergency department with 2 days of productive cough shortness of breath on exertion.  No orthopnea weight gain or peripheral edema.  No chest pain.  No fever or chills.  No GI or GU complaints.  He received an albuterol nebulizer treatment while en route by EMS which she felt markedly better.   External Medical Documents Reviewed: June  2023 emergency department visit for shortness of breath found to be pulmonary edema got better with diuresis.      Physical Exam   Triage Vital Signs: ED Triage Vitals  Enc Vitals Group     BP 10/30/22 0631 (!) 152/56     Pulse Rate 10/30/22 0631 74     Resp 10/30/22 0631 14     Temp 10/30/22 0631 99.2 F (37.3 C)     Temp Source 10/30/22 0631 Oral     SpO2 10/30/22 0637 98 %     Weight 10/30/22 0631 124 lb (56.2 kg)     Height 10/30/22 0631 '5\' 5"'$  (1.651 m)     Head Circumference --      Peak Flow --      Pain Score 10/30/22 0631 0     Pain Loc --      Pain Edu? --      Excl. in [place]? --     Most recent vital signs: Vitals:   10/30/22 0631 10/30/22 0637  BP: (!) 152/56   Pulse: 74   Resp: 14   Temp: 99.2 F (37.3 C)   SpO2:  98%    General: Awake, no distress.  CV:  Good peripheral perfusion.  Resp:  Normal effort.  Abd:  No distention.  Other:  Awake alert comfortable no respiratory distress no fever hemodynamics appropriate reassuring and oxygen is 95% plus on room air.  She has some wheezing on auscultation all lung fields right greater than left no focality and no obvious rales.  No peripheral edema.   ED Results / Procedures / Treatments   Labs (all labs ordered are listed, but only  abnormal results are displayed) Labs Reviewed  BASIC METABOLIC PANEL - Abnormal; Notable for the following components:      Result Value   Potassium 3.4 (*)    Glucose, Bld 126 (*)    Calcium 8.5 (*)    GFR, Estimated 58 (*)    All other components within normal limits  CBC WITH DIFFERENTIAL/PLATELET - Abnormal; Notable for the following components:   WBC 11.3 (*)    RBC 5.13 (*)    MCV 77.2 (*)    MCH 24.2 (*)    RDW 17.9 (*)    Platelets 146 (*)    Neutro Abs 9.0 (*)    Monocytes Absolute 1.1 (*)    All other components within normal limits  BLOOD GAS, VENOUS - Abnormal; Notable for the following components:   pO2, Ven <31 (*)    Bicarbonate 31.2 (*)    Acid-Base Excess 6.0 (*)    All other components within normal limits  RESP PANEL BY RT-PCR (RSV, FLU A&B, COVID)  [id number]  BRAIN NATRIURETIC PEPTIDE  TROPONIN  I (HIGH SENSITIVITY)     I ordered and reviewed the above labs they are notable for mildly elevated white blood cell count  EKG  ED ECG REPORT I, Lucillie Garfinkel, the attending physician, personally viewed and interpreted this ECG.   Date: 10/30/2022  EKG Time: 0633  Rate: 76  Rhythm: AF  Axis: nl  Intervals:none  ST&T Change: no stemi   PROCEDURES:  Critical Care performed: No  Procedures   MEDICATIONS ORDERED IN ED: Medications  albuterol (PROVENTIL) (2.5 MG/3ML) 0.083% nebulizer solution 2.5 mg (has no administration in time range)  ipratropium-albuterol (DUONEB) 0.5-2.5 (3) MG/3ML nebulizer solution 3 mL (3 mLs Nebulization Given 10/30/22 0652)  predniSONE (DELTASONE) tablet 60 mg (60 mg Oral Given 10/30/22 [id number])     IMPRESSION / MDM / ASSESSMENT AND PLAN / ED COURSE  I reviewed the triage vital signs and the nursing notes.                                Patient's presentation is most consistent with acute presentation with potential threat to life or bodily function.  Differential diagnosis includes, but is not limited to, bacterial  pneumonia, respiratory infection viral URI, asthma/COPD exacerbation, CHF exacerbation, ACS or PE less likely.   The patient is on the cardiac monitor to evaluate for evidence of arrhythmia and/or significant heart rate changes.  MDM: Patient with wheezing on auscultation marked improvement with albuterol inhaler treatment given by EMS en route.  Productive cough or sputum production but no hypoxemia or respiratory distress.  Check basic labs, EKG, chest x-ray and viral swabs.  Give another DuoNeb in the emergency department as well as oral steroids.   Patient stable with evaluation as above pending at the time of signout.       FINAL CLINICAL IMPRESSION(S) / ED DIAGNOSES   Final diagnoses:  COPD exacerbation ([place])     Rx / DC Orders   ED Discharge Orders     None        Note:  This document was prepared using Dragon voice recognition software and may include unintentional dictation errors.    Lucillie Garfinkel, MD 10/30/22 0730

## 2022-10-30 NOTE — Consult Note (Signed)
ANTICOAGULATION CONSULT NOTE - Initial Consult  Pharmacy Consult for warfarin Indication: atrial fibrillation  Allergies  Allergen Reactions   Shellfish Allergy Anaphylaxis    Other reaction(s): Hallucination   Azithromycin Other (See Comments)    Extreme burning sensation at IV site    Tamiflu [Oseltamivir Phosphate] Other (See Comments)    Reaction:  Hallucinations     Albuterol Palpitations    Heart racing.     Patient Measurements: Height: '5\' 5"'$  (165.1 cm) Weight: 56.2 kg (124 lb) IBW/kg (Calculated) : 57  Vital Signs: Temp: 98.4 F (36.9 C) (03/14 1153) Temp Source: Oral (03/14 1153) BP: 140/65 (03/14 1330) Pulse Rate: 79 (03/14 1330)  Labs: Recent Labs    10/30/22 0638 10/30/22 0848  HGB 12.4  --   HCT 39.6  --   PLT 146*  --   CREATININE 0.94  --   TROPONINIHS 14 15    Estimated Creatinine Clearance: 36 mL/min (by C-G formula based on SCr of 0.94 mg/dL).   Medical History: Past Medical History:  Diagnosis Date   Aortic atherosclerosis ([place])    a. 05/2021 TEE: GrIII atheroma plaque involving the asc, transverse, and desc Ao.   Asthma    CAD (coronary artery disease)    a. 04/2021 Cath: LM nl, LAD 85p/m, D1 80, RI nl, LCX nl, RCA nl; b. 07/2021 PCI: pLAD (2.75x26 Onyx Frontier DES), D1 (2.5x22 Onyx Frontier DES).   Carotid artery disease ([place])    a. s/p R carotid stenting (57m x 719mx 4cm long Exact stent); b. 06/2021 U/S: RICA [id number]  CHF (congestive heart failure) [place]   Community acquired pneumonia    COPD (chronic obstructive pulmonary disease) [place]   Essential hypertension    Glaucoma    Hyperlipidemia    Mitral regurgitation    a. TTE 08/2015: EF 60-65%, normal wall motion, mild MR, mildly dilated left atrium measuring 40 mm, RVSF normal, PASP normal; b. 05/2021 TEE: Moderate MR.   Mitral stenosis    a. 05/2021 L/RHC: Sev MS w/ mean grad 13-1762m and MV area of 0.5-06.cm^2; b. 05/2021 TEE: EF 55-60%, no rwma, nl RV fxn, mod  MR, mod MS (MV area by P1/2t: 1.61 cm^2 w/ mean grad of 9mm31m.   Peripheral neuropathy    Persistent atrial fibrillation (HCC)[place]/30/2017   a. s/p DCCV 11/15/2015; b. CHADS2VASc => 4 (HTN, age x 2, female)--> warfarin; c. 06/2021 recurrent afib-->amio added.   Squamous cell carcinoma of skin 12/18/2021   R dorsum hand, EDC   Squamous cell carcinoma of skin 12/31/2021   R hand dorsum, recurrent - excised 02/04/2022   Squamous cell carcinoma of skin 12/31/2021   L forearm - ED&C   Squamous cell carcinoma of skin 06/24/2022   R thumb webspace with wart virus - ED&C   Squamous cell carcinoma of skin 10/20/2022   L lat knee - tx with ED&C    Medications:  Patient on warfarin PTA   *PTA stable dose 3.'75mg'$  daily except for 2.'5mg'$  every Mo/We,Fr (weekly warfarin dose 22.'5mg'$ ), TTR 49.3%. Per patient hx her last warfarin dose taken yesterday 3/13. Last INR = 2.0 on 10/22/2022  Assessment: Molly Hodges patient presents to emergency department with increased SOB. PMH includes atrial fibrillation on warfarin, mitral valve stenosis,asthma, CHF, COPD not on home oxygen. Pharmacy consulted to manage warfarin while inpatient.  Baseline INR ordered. Noted H/H are within normla limits. Plt = 146, and no s/sx of bleeding or new VTE/CVA  on admission.  Goal of Therapy:  INR 2-3 Monitor platelets by anticoagulation protocol: Yes   Plan:  DC enoxaparin Warfarin 3.'75mg'$  today Daily INR x 3 CBC at least every 72hrs  Dhruv Christina Rodriguez-Guzman PharmD, BCPS 10/30/2022 2:02 PM

## 2022-10-30 NOTE — ED Notes (Signed)
RN called RT to notify VBG being sent to lab

## 2022-10-30 NOTE — Assessment & Plan Note (Addendum)
On coumadin  Cont per pharmacy  Cont home rate controlling agents

## 2022-10-31 ENCOUNTER — Observation Stay (HOSPITAL_COMMUNITY)
Admit: 2022-10-31 | Discharge: 2022-10-31 | Disposition: A | Discharge status: Home / Self Care | Payer: Medicare Other | Attending: Family Medicine | Admitting: Family Medicine

## 2022-10-31 DIAGNOSIS — Z8249 Family history of ischemic heart disease and other diseases of the circulatory system: Secondary | ICD-10-CM | POA: Diagnosis not present

## 2022-10-31 DIAGNOSIS — Z801 Family history of malignant neoplasm of trachea, bronchus and lung: Secondary | ICD-10-CM | POA: Diagnosis not present

## 2022-10-31 DIAGNOSIS — Z1152 Encounter for screening for COVID-19: Secondary | ICD-10-CM | POA: Diagnosis not present

## 2022-10-31 DIAGNOSIS — E785 Hyperlipidemia, unspecified: Secondary | ICD-10-CM | POA: Diagnosis present

## 2022-10-31 DIAGNOSIS — I5031 Acute diastolic (congestive) heart failure: Secondary | ICD-10-CM | POA: Diagnosis not present

## 2022-10-31 DIAGNOSIS — J45901 Unspecified asthma with (acute) exacerbation: Secondary | ICD-10-CM | POA: Diagnosis present

## 2022-10-31 DIAGNOSIS — R0603 Acute respiratory distress: Secondary | ICD-10-CM

## 2022-10-31 DIAGNOSIS — Z91013 Allergy to seafood: Secondary | ICD-10-CM | POA: Diagnosis not present

## 2022-10-31 DIAGNOSIS — Z881 Allergy status to other antibiotic agents status: Secondary | ICD-10-CM | POA: Diagnosis not present

## 2022-10-31 DIAGNOSIS — I5033 Acute on chronic diastolic (congestive) heart failure: Secondary | ICD-10-CM | POA: Diagnosis present

## 2022-10-31 DIAGNOSIS — D72828 Other elevated white blood cell count: Secondary | ICD-10-CM | POA: Diagnosis present

## 2022-10-31 DIAGNOSIS — Z79899 Other long term (current) drug therapy: Secondary | ICD-10-CM | POA: Diagnosis not present

## 2022-10-31 DIAGNOSIS — Z90710 Acquired absence of both cervix and uterus: Secondary | ICD-10-CM | POA: Diagnosis not present

## 2022-10-31 DIAGNOSIS — Z7902 Long term (current) use of antithrombotics/antiplatelets: Secondary | ICD-10-CM | POA: Diagnosis not present

## 2022-10-31 DIAGNOSIS — Z888 Allergy status to other drugs, medicaments and biological substances status: Secondary | ICD-10-CM | POA: Diagnosis not present

## 2022-10-31 DIAGNOSIS — Z7901 Long term (current) use of anticoagulants: Secondary | ICD-10-CM | POA: Diagnosis not present

## 2022-10-31 DIAGNOSIS — I959 Hypotension, unspecified: Secondary | ICD-10-CM | POA: Diagnosis present

## 2022-10-31 DIAGNOSIS — I110 Hypertensive heart disease with heart failure: Secondary | ICD-10-CM | POA: Diagnosis present

## 2022-10-31 DIAGNOSIS — I2510 Atherosclerotic heart disease of native coronary artery without angina pectoris: Secondary | ICD-10-CM | POA: Diagnosis present

## 2022-10-31 DIAGNOSIS — I4819 Other persistent atrial fibrillation: Secondary | ICD-10-CM | POA: Diagnosis present

## 2022-10-31 DIAGNOSIS — Z85828 Personal history of other malignant neoplasm of skin: Secondary | ICD-10-CM | POA: Diagnosis not present

## 2022-10-31 DIAGNOSIS — J209 Acute bronchitis, unspecified: Secondary | ICD-10-CM | POA: Diagnosis present

## 2022-10-31 DIAGNOSIS — Z7951 Long term (current) use of inhaled steroids: Secondary | ICD-10-CM | POA: Diagnosis not present

## 2022-10-31 LAB — CBC
HCT: 37.4 % (ref 36.0–46.0)
Hemoglobin: 11.7 g/dL — ABNORMAL LOW (ref 12.0–15.0)
MCH: 23.7 pg — ABNORMAL LOW (ref 26.0–34.0)
MCHC: 31.3 g/dL (ref 30.0–36.0)
MCV: 75.7 fL — ABNORMAL LOW (ref 80.0–100.0)
Platelets: 210 10*3/uL (ref 150–400)
RBC: 4.94 MIL/uL (ref 3.87–5.11)
RDW: 17.8 % — ABNORMAL HIGH (ref 11.5–15.5)
WBC: 12.5 10*3/uL — ABNORMAL HIGH (ref 4.0–10.5)
nRBC: 0 % (ref 0.0–0.2)

## 2022-10-31 LAB — RESPIRATORY PANEL BY PCR

## 2022-10-31 LAB — COMPREHENSIVE METABOLIC PANEL
ALT: 13 U/L (ref 0–44)
AST: 17 U/L (ref 15–41)
Albumin: 3.5 g/dL (ref 3.5–5.0)
Alkaline Phosphatase: 89 U/L (ref 38–126)
Anion gap: 15 (ref 5–15)
BUN: 26 mg/dL — ABNORMAL HIGH (ref 8–23)
CO2: 26 mmol/L (ref 22–32)
Calcium: 8.8 mg/dL — ABNORMAL LOW (ref 8.9–10.3)
Chloride: 101 mmol/L (ref 98–111)
Creatinine, Ser: 0.95 mg/dL (ref 0.44–1.00)
GFR, Estimated: 57 mL/min — ABNORMAL LOW (ref >60)
Glucose, Bld: 128 mg/dL — ABNORMAL HIGH (ref 70–99)
Potassium: 3.1 mmol/L — ABNORMAL LOW (ref 3.5–5.1)
Sodium: 142 mmol/L (ref 135–145)
Total Bilirubin: 0.9 mg/dL (ref 0.3–1.2)
Total Protein: 6.8 g/dL (ref 6.5–8.1)

## 2022-10-31 LAB — ECHOCARDIOGRAM COMPLETE
AR max vel: 1.59 cm2
AV Area VTI: 2.07 cm2
AV Area mean vel: 1.54 cm2
AV Mean grad: 3 mmHg
AV Peak grad: 5.2 mmHg
Ao pk vel: 1.14 m/s
Area-P 1/2: 1.24 cm2
Height: 65 in
MV VTI: 0.67 cm2
S' Lateral: 3.1 cm
Weight: 1984 oz

## 2022-10-31 LAB — PROTIME-INR
INR: 2.5 — ABNORMAL HIGH (ref 0.8–1.2)
Prothrombin Time: 26.7 seconds — ABNORMAL HIGH (ref 11.4–15.2)

## 2022-10-31 MED ORDER — LEVALBUTEROL HCL 0.63 MG/3ML IN NEBU
0.6300 mg | INHALATION_SOLUTION | Freq: Four times a day (QID) | RESPIRATORY_TRACT | Status: DC
  Administered 2022-10-31 – 2022-11-01 (×2): 0.63 mg via RESPIRATORY_TRACT
  Filled 2022-10-31 (×2): qty 3

## 2022-10-31 MED ORDER — IPRATROPIUM BROMIDE 0.02 % IN SOLN
0.5000 mg | Freq: Four times a day (QID) | RESPIRATORY_TRACT | Status: DC
  Administered 2022-10-31 – 2022-11-01 (×2): 0.5 mg via RESPIRATORY_TRACT
  Filled 2022-10-31 (×2): qty 2.5

## 2022-10-31 MED ORDER — WARFARIN SODIUM 2.5 MG PO TABS
2.5000 mg | ORAL_TABLET | Freq: Once | ORAL | Status: AC
  Administered 2022-10-31: 2.5 mg via ORAL
  Filled 2022-10-31: qty 1

## 2022-10-31 MED ORDER — FUROSEMIDE 10 MG/ML IJ SOLN
40.0000 mg | Freq: Two times a day (BID) | INTRAMUSCULAR | Status: DC
  Administered 2022-10-31 – 2022-11-01 (×3): 40 mg via INTRAVENOUS
  Filled 2022-10-31 (×3): qty 4

## 2022-10-31 MED ORDER — POTASSIUM CHLORIDE CRYS ER 20 MEQ PO TBCR
40.0000 meq | EXTENDED_RELEASE_TABLET | Freq: Once | ORAL | Status: AC
  Administered 2022-10-31: 40 meq via ORAL
  Filled 2022-10-31: qty 2

## 2022-10-31 NOTE — Progress Notes (Signed)
*  PRELIMINARY RESULTS* Echocardiogram 2D Echocardiogram has been performed.  Sherrie Sport 10/31/2022, 8:25 AM

## 2022-10-31 NOTE — Progress Notes (Signed)
  PROGRESS NOTE    Molly Hodges  [id number] DOB: 06-19-1932 DOA: 10/30/2022 PCP: Birdie Sons, MD  [id number]  LOS: 0 days   Brief hospital course:   Assessment & Plan: Molly Hodges is a 89 y.o. female with medical history significant of COPD, chronic diastolic CHF, coronary artery disease, hypertension, hyperlipidemia, mitral regurgitation presenting with acute respiratory failure, CHF and COPD exacerbations.  Patient reports progressively worsening increased work of breathing with past 2 days.  No chest pain.  Positive cough and increased sputum production.    * Acute respiratory distress (HCC) --2/2 COPD and CHF flares --no noted hypoxia --treat underlying causes  COPD with acute bronchitis (HCC) Acute COPD exacerbation with increased cough, wheezing, sputum production Started on prednisone in the ER --cont prednisone  --obtain RVP --d/c ceftriaxone --schedule Xopenex and ipratropium nebs QID  Acute on chronic diastolic CHF (congestive heart failure) ([place]) 2D echo 05/2021 with EF of 50 to 55% Acute decompensation of baseline diastolic dysfunction BNP [id number] with chest x-ray concerning for CHF Status post 80 mg IV Lasix in ER --cont IV lasix 40 BID  Coronary artery disease of native artery of native heart with stable angina pectoris (HCC) No active CP  Trop neg x 2 --cont statin  Hyperlipidemia Stable  Cont home crestor   Paroxysmal atrial fibrillation (HCC) On coumadin  Cont per pharmacy  --hold home dilt 2/2 hypotension  Hypertension --BP soft --hold home dilt, HCTZ and Imdur while diuresing    DVT prophylaxis: [id number] Code Status: Full code  Family Communication:  Level of care: Med-Surg Dispo:   The patient is from: home Anticipated d/c is to: home Anticipated d/c date is: tomorrow   Subjective and Interval History:  Pt reported breathing improved.  Continued to have dry cough.  Good urine output.   Objective: Vitals:    10/31/22 0833 10/31/22 0900 10/31/22 1109 10/31/22 1646  BP:   (!) 111/47 (!) 129/46  Pulse:   70 64  Resp:  17 18 16   Temp:   98.2 F (36.8 C) 98.1 F (36.7 C)  TempSrc:   Oral   SpO2:   99% 97%  Weight: 54.6 kg     Height:        Intake/Output Summary (Last 24 hours) at 10/31/2022 1749 Last data filed at 10/31/2022 1600 Gross per 24 hour  Intake 920 ml  Output 650 ml  Net 270 ml   Filed Weights   10/30/22 0631 10/31/22 0833  Weight: 56.2 kg 54.6 kg    Examination:   Constitutional: NAD, AAOx3 HEENT: conjunctivae and lids normal, EOMI CV: No cyanosis.   RESP: normal respiratory effort, on RA Extremities: No effusions, edema in BLE SKIN: warm, dry Neuro: II - XII grossly intact.   Psych: Normal mood and affect.  Appropriate judgement and reason   Data Reviewed: I have personally reviewed labs and imaging studies  Time spent: 50 minutes  Enzo Bi, MD [facility] If 7PM-7AM, please contact night-coverage 10/31/2022, 5:49 PM

## 2022-10-31 NOTE — Consult Note (Signed)
[place] for warfarin Indication: atrial fibrillation  Allergies  Allergen Reactions   Shellfish Allergy Anaphylaxis    Other reaction(s): Hallucination   Azithromycin Other (See Comments)    Extreme burning sensation at IV site    Tamiflu [Oseltamivir Phosphate] Other (See Comments)    Reaction:  Hallucinations     Albuterol Palpitations    Heart racing.     Patient Measurements: Height: 5\' 5"  (165.1 cm) Weight: 56.2 kg (124 lb) IBW/kg (Calculated) : 57  Vital Signs: Temp: 97.8 F (36.6 C) (03/15 0458) BP: 119/49 (03/15 0458) Pulse Rate: 56 (03/15 0458)  Labs: Recent Labs    10/30/22 0638 10/30/22 0848 10/30/22 1504 10/31/22 0532  HGB 12.4  --   --  11.7*  HCT 39.6  --   --  37.4  PLT 146*  --   --  210  LABPROT  --   --  24.9* 26.7*  INR  --   --  2.3* 2.5*  CREATININE 0.94  --   --  0.95  TROPONINIHS 14 15  --   --      Estimated Creatinine Clearance: 35.6 mL/min (by C-G formula based on SCr of 0.95 mg/dL).   Medical History: Past Medical History:  Diagnosis Date   Aortic atherosclerosis ([place])    a. 05/2021 TEE: GrIII atheroma plaque involving the asc, transverse, and desc Ao.   Asthma    CAD (coronary artery disease)    a. 04/2021 Cath: LM nl, LAD 85p/m, D1 80, RI nl, LCX nl, RCA nl; b. 07/2021 PCI: pLAD (2.75x26 Onyx Frontier DES), D1 (2.5x22 Onyx Frontier DES).   Carotid artery disease ([place])    a. s/p R carotid stenting (45mm x 47mm x 4cm long Exact stent); b. 06/2021 U/S: RICA 123456, LICA 123456.   CHF (congestive heart failure) ([place])    Community acquired pneumonia    COPD (chronic obstructive pulmonary disease) ([place])    Essential hypertension    Glaucoma    Hyperlipidemia    Mitral regurgitation    a. TTE 08/2015: EF 60-65%, normal wall motion, mild MR, mildly dilated left atrium measuring 40 mm, RVSF normal, PASP normal; b. 05/2021 TEE: Moderate MR.   Mitral stenosis    a. 05/2021 L/RHC: Sev MS w/ mean  grad 13-61mmHg and MV area of 0.5-06.cm^2; b. 05/2021 TEE: EF 55-60%, no rwma, nl RV fxn, mod MR, mod MS (MV area by P1/2t: 1.61 cm^2 w/ mean grad of 68mmHg).   Peripheral neuropathy    Persistent atrial fibrillation ([place]) 09/17/2015   a. s/p DCCV 11/15/2015; b. CHADS2VASc => 4 (HTN, age x 2, female)--> warfarin; c. 06/2021 recurrent afib-->amio added.   Squamous cell carcinoma of skin 12/18/2021   R dorsum hand, EDC   Squamous cell carcinoma of skin 12/31/2021   R hand dorsum, recurrent - excised 02/04/2022   Squamous cell carcinoma of skin 12/31/2021   L forearm - ED&C   Squamous cell carcinoma of skin 06/24/2022   R thumb webspace with wart virus - ED&C   Squamous cell carcinoma of skin 10/20/2022   L lat knee - tx with ED&C    Medications:  Patient on warfarin PTA   *PTA stable dose 3.75mg  daily except for 2.5mg  every Mo/We,Fr (weekly warfarin dose 22.5mg ), TTR 49.3%. Per patient hx her last warfarin dose taken yesterday 3/13. Last INR = 2.0 on 10/22/2022  Assessment: 89 yo female patient presents to emergency department with increased SOB. PMH includes atrial  fibrillation on warfarin, mitral valve stenosis,asthma, CHF, COPD not on home oxygen. Pharmacy consulted to manage warfarin while inpatient.   Date INR Warfarin Dose  3/14 2.3 3.75 mg  3/15 2.5 2.5     Goal of Therapy:  INR 2-3 Monitor platelets by anticoagulation protocol: Yes   Plan:  INR is therapeutic. Will continue warfarin home dose of 2.5 mg x 1 tonight. Daily INR. CBC at least every 3 days.    [name], PharmD, BCPS 10/31/2022 7:09 AM

## 2022-11-01 ENCOUNTER — Encounter: Payer: Self-pay | Admitting: Hospitalist

## 2022-11-01 DIAGNOSIS — R0603 Acute respiratory distress: Secondary | ICD-10-CM | POA: Diagnosis not present

## 2022-11-01 LAB — BASIC METABOLIC PANEL
Anion gap: 10 (ref 5–15)
BUN: 29 mg/dL — ABNORMAL HIGH (ref 8–23)
CO2: 28 mmol/L (ref 22–32)
Calcium: 8.7 mg/dL — ABNORMAL LOW (ref 8.9–10.3)
Chloride: 101 mmol/L (ref 98–111)
Creatinine, Ser: 0.91 mg/dL (ref 0.44–1.00)
GFR, Estimated: >60 mL/min (ref >60)
Glucose, Bld: 119 mg/dL — ABNORMAL HIGH (ref 70–99)
Potassium: 3.7 mmol/L (ref 3.5–5.1)
Sodium: 139 mmol/L (ref 135–145)

## 2022-11-01 LAB — CBC
HCT: 39.9 % (ref 36.0–46.0)
Hemoglobin: 12.5 g/dL (ref 12.0–15.0)
MCH: 23.7 pg — ABNORMAL LOW (ref 26.0–34.0)
MCHC: 31.3 g/dL (ref 30.0–36.0)
MCV: 75.7 fL — ABNORMAL LOW (ref 80.0–100.0)
Platelets: 238 10*3/uL (ref 150–400)
RBC: 5.27 MIL/uL — ABNORMAL HIGH (ref 3.87–5.11)
RDW: 17.4 % — ABNORMAL HIGH (ref 11.5–15.5)
WBC: 13.3 10*3/uL — ABNORMAL HIGH (ref 4.0–10.5)
nRBC: 0 % (ref 0.0–0.2)

## 2022-11-01 LAB — PROTIME-INR
INR: 2.8 — ABNORMAL HIGH (ref 0.8–1.2)
Prothrombin Time: 29.5 seconds — ABNORMAL HIGH (ref 11.4–15.2)

## 2022-11-01 LAB — MAGNESIUM: Magnesium: 2.6 mg/dL — ABNORMAL HIGH (ref 1.7–2.4)

## 2022-11-01 MED ORDER — IPRATROPIUM BROMIDE 0.02 % IN SOLN: 0.5000 mg | Freq: Three times a day (TID) | RESPIRATORY_TRACT | Status: DC

## 2022-11-01 MED ORDER — LEVALBUTEROL HCL 0.63 MG/3ML IN NEBU: 0.6300 mg | INHALATION_SOLUTION | Freq: Three times a day (TID) | RESPIRATORY_TRACT | Status: DC

## 2022-11-01 MED ORDER — PREDNISONE 20 MG PO TABS: 40.0000 mg | ORAL_TABLET | Freq: Every day | ORAL | 0 refills | Status: AC

## 2022-11-01 MED ORDER — WARFARIN SODIUM 2.5 MG PO TABS: 3.7500 mg | ORAL_TABLET | Freq: Once | ORAL | Status: DC

## 2022-11-01 NOTE — Progress Notes (Signed)
Patient discharged. All belongings with pt. 

## 2022-11-01 NOTE — Consult Note (Addendum)
[place] for warfarin Indication: atrial fibrillation  Allergies  Allergen Reactions   Shellfish Allergy Anaphylaxis    Other reaction(s): Hallucination   Azithromycin Other (See Comments)    Extreme burning sensation at IV site    Tamiflu [Oseltamivir Phosphate] Other (See Comments)    Reaction:  Hallucinations     Albuterol Palpitations    Heart racing.     Patient Measurements: Height: 5\' 5"  (165.1 cm) Weight: 55.9 kg (123 lb 3.8 oz) IBW/kg (Calculated) : 57  Vital Signs: Temp: 97.6 F (36.4 C) (03/16 0808) Temp Source: Oral (03/16 0808) BP: 145/59 (03/16 0808) Pulse Rate: 57 (03/16 0808)  Labs: Recent Labs    10/30/22 0638 10/30/22 0848 10/30/22 1504 10/31/22 0532 11/01/22 0440  HGB 12.4  --   --  11.7* 12.5  HCT 39.6  --   --  37.4 39.9  PLT 146*  --   --  210 238  LABPROT  --   --  24.9* 26.7* 29.5*  INR  --   --  2.3* 2.5* 2.8*  CREATININE 0.94  --   --  0.95 0.91  TROPONINIHS 14 15  --   --   --      Estimated Creatinine Clearance: 37 mL/min (by C-G formula based on SCr of 0.91 mg/dL).   Medical History: Past Medical History:  Diagnosis Date   Aortic atherosclerosis ([place])    a. 05/2021 TEE: GrIII atheroma plaque involving the asc, transverse, and desc Ao.   Asthma    CAD (coronary artery disease)    a. 04/2021 Cath: LM nl, LAD 85p/m, D1 80, RI nl, LCX nl, RCA nl; b. 07/2021 PCI: pLAD (2.75x26 Onyx Frontier DES), D1 (2.5x22 Onyx Frontier DES).   Carotid artery disease ([place])    a. s/p R carotid stenting (42mm x 74mm x 4cm long Exact stent); b. 06/2021 U/S: RICA 123456, LICA 123456.   CHF (congestive heart failure) ([place])    Community acquired pneumonia    COPD (chronic obstructive pulmonary disease) ([place])    Essential hypertension    Glaucoma    Hyperlipidemia    Mitral regurgitation    a. TTE 08/2015: EF 60-65%, normal wall motion, mild MR, mildly dilated left atrium measuring 40 mm, RVSF normal, PASP  normal; b. 05/2021 TEE: Moderate MR.   Mitral stenosis    a. 05/2021 L/RHC: Sev MS w/ mean grad 13-59mmHg and MV area of 0.5-06.cm^2; b. 05/2021 TEE: EF 55-60%, no rwma, nl RV fxn, mod MR, mod MS (MV area by P1/2t: 1.61 cm^2 w/ mean grad of 32mmHg).   Peripheral neuropathy    Persistent atrial fibrillation ([place]) 09/17/2015   a. s/p DCCV 11/15/2015; b. CHADS2VASc => 4 (HTN, age x 2, female)--> warfarin; c. 06/2021 recurrent afib-->amio added.   Squamous cell carcinoma of skin 12/18/2021   R dorsum hand, EDC   Squamous cell carcinoma of skin 12/31/2021   R hand dorsum, recurrent - excised 02/04/2022   Squamous cell carcinoma of skin 12/31/2021   L forearm - ED&C   Squamous cell carcinoma of skin 06/24/2022   R thumb webspace with wart virus - ED&C   Squamous cell carcinoma of skin 10/20/2022   L lat knee - tx with ED&C    Medications:  Patient on warfarin PTA   *PTA stable dose 3.75mg  daily except for 2.5mg  every Mo/We,Fr (weekly warfarin dose 22.5mg ), TTR 49.3%. Per patient hx her last warfarin dose taken yesterday 3/13. Last INR = 2.0  on 10/22/2022  Assessment: 89 yo female patient presents to emergency department with increased SOB. PMH includes atrial fibrillation on warfarin, mitral valve stenosis,asthma, CHF, COPD not on home oxygen. Pharmacy consulted to manage warfarin while inpatient.   Date INR Warfarin Dose  3/14 2.3 3.75 mg  3/15 2.5 2.5  3/16 2.8 3.75     Goal of Therapy:  INR 2-3 Monitor platelets by anticoagulation protocol: Yes   Plan:  INR is therapeutic. Will continue warfarin home dose of 3.75 mg x 1 tonight. Daily INR. CBC at least every 3 days.   Pearla Dubonnet, PharmD Clinical Pharmacist 11/01/2022 9:17 AM

## 2022-11-01 NOTE — TOC Initial Note (Signed)
Transition of Care [facility]) - Initial/Assessment Note    Patient Details  Name: Molly Hodges MRN: [id number] Date of Birth: 12-18-1931  [facility]) CM/SW Contact:    Rebekah [place], [place] Phone Number: 11/01/2022, 10:07 AM  Clinical Narrative:                 Readmission prevention screen complete. CSW spoke with patient. CSW introduced role and explained that discharge planning would be discussed. PCP is Lelon Huh. Family and friends provide transportation to appointments. Pt's son will transport her home at discharge. Pharmacy is [facility]. Pt endorsed no barriers to obtaining medications. Patient lives home with her husband. She has a wheelchair, walker, and cane at home. No further concerns  Expected Discharge Plan: Home/Self Care Barriers to Discharge: No Barriers Identified   Patient Goals and CMS Choice            Expected Discharge Plan and Services       Living arrangements for the past 2 months: Single Family Home Expected Discharge Date: 11/01/22                                    Prior Living Arrangements/Services Living arrangements for the past 2 months: Single Family Home Lives with:: Spouse Patient language and need for interpreter reviewed:: Yes Do you feel safe going back to the place where you live?: Yes      Need for Family Participation in Patient Care: Yes (Comment) Care giver support system in place?: Yes (comment) Current home services: DME (Patient has a wheelchair, walker, and cane in the home) Criminal Activity/Legal Involvement Pertinent to Current Situation/Hospitalization: No - Comment as needed  Activities of Daily Living Home Assistive Devices/Equipment: Walker (specify type), Eyeglasses ADL Screening (condition at time of admission) Patient's cognitive ability adequate to safely complete daily activities?: Yes Is the patient deaf or have difficulty hearing?: No Does the patient have difficulty seeing, even  when wearing glasses/contacts?: No Does the patient have difficulty concentrating, remembering, or making decisions?: No Patient able to express need for assistance with ADLs?: Yes Does the patient have difficulty dressing or bathing?: No Independently performs ADLs?: Yes (appropriate for developmental age) Does the patient have difficulty walking or climbing stairs?: No Weakness of Legs: None Weakness of Arms/Hands: None  Permission Sought/Granted                  Emotional Assessment   Attitude/Demeanor/Rapport: Engaged Affect (typically observed): Appropriate, Pleasant Orientation: : Oriented to Self, Oriented to Place, Oriented to  Time, Oriented to Situation Alcohol / Substance Use: Not Applicable Psych Involvement: No (comment)  Admission diagnosis:  Acute respiratory failure ([place]) [J96.00] COPD exacerbation ([place]) [J44.1] Acute respiratory distress [R06.03] Patient Active Problem List   Diagnosis Date Noted   Acute respiratory distress 10/31/2022   Acute respiratory failure ([place]) 10/30/2022   COPD exacerbation ([place]) 08/06/2021   Chronic diastolic CHF (congestive heart failure) ([place]) 08/06/2021   CAD (coronary artery disease)    Abnormal LFTs    Acute on chronic diastolic CHF (congestive heart failure) ([place])    Hypokalemia 08/03/2021   Coronary artery disease of native artery of native heart with stable angina pectoris ([place]) 08/02/2021   Moderate mitral regurgitation 06/17/2021   Mitral valve stenosis 05/17/2021   Dyspnea 01/01/2021   Chronic respiratory failure with hypoxia ([place]) 01/01/2021   PAD (peripheral artery disease) ([place]) 11/13/2020  Hypochromic microcytic anemia 09/17/2020   Hyperlipidemia 09/17/2020   Paroxysmal atrial fibrillation ([place]) 08/30/2020   Carotid stenosis, right 08/15/2020   Carotid stenosis 07/17/2020   Meningioma ([place]) 07/04/2020   Cyst of thyroid 07/03/2020   Hypothyroidism 07/03/2020   Long-term use of high-risk medication  11/11/2016   COPD with acute bronchitis ([place]) 11/11/2016   Lower extremity edema 05/29/2016   Elevated troponin    Hypoxia    Encounter for anticoagulation discussion and counseling    Premature ventricular contraction 03/26/2015   Vitamin D deficiency 03/26/2015   Peripheral neuropathy 03/26/2015   Hypertension 03/26/2015   Asthma exacerbation 03/26/2015   PCP:  Birdie Sons, MD Pharmacy:   [place], [place] - [address] [place] [address] Phone: [phone] Fax: [phone]     Social Determinants of Health (SDOH) Social History: SDOH Screenings   Food Insecurity: No Food Insecurity (10/30/2022)  Housing: Low Risk  (10/30/2022)  Transportation Needs: No Transportation Needs (10/30/2022)  Utilities: Not At Risk (10/30/2022)  Alcohol Screen: Low Risk  (10/21/2022)  Depression (PHQ2-9): Low Risk  (10/21/2022)  Financial Resource Strain: Low Risk  (10/15/2021)  Physical Activity: Inactive (10/21/2022)  Social Connections: Moderately Isolated (10/21/2022)  Stress: No Stress Concern Present (10/21/2022)  Tobacco Use: Low Risk  (11/01/2022)   SDOH Interventions:     Readmission Risk Interventions    08/07/2021    1:24 PM  Readmission Risk Prevention Plan  Transportation Screening Complete  PCP or Specialist Appt within 3-5 Days Complete  HRI or [place] Complete  Social Work Consult for [place] Planning/Counseling Complete  Palliative Care Screening Not Applicable  Medication Review Press photographer) Complete

## 2022-11-01 NOTE — Discharge Summary (Addendum)
Physician Discharge Summary   Molly Hodges  female DOB: Jan 07, 1932  [id number]  PCP: Birdie Sons, MD  Admit date: 10/30/2022 Discharge date: 11/01/2022  Admitted From: home Disposition:  home CODE STATUS: Full code  Discharge Instructions     Diet - low sodium heart healthy   Complete by: As directed       Hospital Course:  For full details, please see H&P, progress notes, consult notes and ancillary notes.  Briefly,  Molly Hodges is a 89 y.o. female with medical history significant of asthma, chronic diastolic CHF, coronary artery disease, hypertension, mitral regurgitation presenting with acute respiratory distress.  Patient reported progressively worsening increased work of breathing with past 2 days.  Positive cough and increased sputum production.    * Acute respiratory distress (HCC) --2/2 asthma and CHF flares --no noted hypoxia --treated underlying causes   Asthma exacerbation  COPD ruled out --with increased cough, wheezing, sputum production.  Pt reported hx of asthma, but was a never smoker and denied formal dx of COPD. --Started on prednisone in the ER, discharged on 40 mg daily to finish 5-day steroid burst. --No strong evidence of PNA, so ceftriaxone was not continued. --received and discharged on Xopenex and ipratropium nebs    Acute on chronic diastolic CHF (congestive heart failure) ([place]) 2D echo 05/2021 with EF of 50 to 55% Acute decompensation of baseline diastolic dysfunction BNP [id number] with chest x-ray concerning for CHF Status post 80 mg IV Lasix in ER f/b IV lasix 40 BID.  Discharged back on home torsemide.   Coronary artery disease of native artery of native heart with stable angina pectoris (HCC) No active CP  Trop neg x 2 --cont statin   Hyperlipidemia Stable  Cont home crestor    Paroxysmal atrial fibrillation (HCC) On coumadin.  Pt reported HR up and down at home. --held home dilt 2/2 hypotension during  hospitalization, to be resumed after discharge.   Hypertension --BP soft on presentation.    --home dilt, HCTZ and Imdur held while diuresing, but resumed after discharge.   Discharge Diagnoses:  Active Problems:   Hypertension   Paroxysmal atrial fibrillation (HCC)   Hyperlipidemia   Coronary artery disease of native artery of native heart with stable angina pectoris (HCC)   Acute respiratory distress   30 Day Unplanned Readmission Risk Score    Flowsheet Row ED to Hosp-Admission (Current) from 10/30/2022 in [facility]  30 Day Unplanned Readmission Risk Score (%) 21.53 Filed at 11/01/2022 0800       This score is the patient's risk of an unplanned readmission within 30 days of being discharged (0 -100%). The score is based on dignosis, age, lab data, medications, orders, and past utilization.   Low:  0-14.9   Medium: 15-21.9   High: 22-29.9   Extreme: 30 and above         Discharge Instructions:  Allergies as of 11/01/2022       Reactions   Shellfish Allergy Anaphylaxis   Other reaction(s): Hallucination   Azithromycin Other (See Comments)   Extreme burning sensation at IV site   Tamiflu [oseltamivir Phosphate] Other (See Comments)   Reaction:  Hallucinations    Albuterol Palpitations   Heart racing.         Medication List     STOP taking these medications    mupirocin ointment 2 % Commonly known as: BACTROBAN       TAKE these medications  ascorbic acid 500 MG tablet Commonly known as: VITAMIN C Take 500 mg by mouth daily.   Calcium Carbonate-Vitamin D 600-400 MG-UNIT tablet Take 1 tablet by mouth daily.   cholecalciferol 1000 units tablet Commonly known as: VITAMIN D Take 1,000 Units by mouth daily.   clopidogrel 75 MG tablet Commonly known as: PLAVIX TAKE 1 TABLET BY MOUTH DAILY   diltiazem 180 MG 24 hr capsule Commonly known as: CARDIZEM CD TAKE 1 CAPSULE EVERY DAY   dorzolamide 2 % ophthalmic  solution Commonly known as: TRUSOPT Place 1 drop into both eyes 2 (two) times daily.   ezetimibe 10 MG tablet Commonly known as: ZETIA TAKE 1 TABLET BY MOUTH DAILY.   hydrochlorothiazide 12.5 MG capsule Commonly known as: MICROZIDE Take 12.5 mg by mouth daily.   ipratropium-albuterol 0.5-2.5 (3) MG/3ML Soln Commonly known as: DUONEB TAKE 3MLS BY NEBULIZATION EVERY 4 HOURS AS NEEDED   isosorbide mononitrate 30 MG 24 hr tablet Commonly known as: IMDUR TAKE 1 TABLET BY MOUTH DAILY   latanoprost 0.005 % ophthalmic solution Commonly known as: XALATAN Place 1 drop into both eyes at bedtime.   levalbuterol 45 MCG/ACT inhaler Commonly known as: Xopenex HFA Inhale 2 puffs into the lungs every 6 (six) hours as needed for wheezing or shortness of breath.   magnesium oxide 400 MG tablet Commonly known as: MAG-OX Take 400 mg by mouth daily.   nitroGLYCERIN 0.4 MG SL tablet Commonly known as: Nitrostat Place 1 tablet (0.4 mg total) under the tongue every 5 (five) minutes as needed (for chest pain or shortness of breath).   potassium chloride 10 MEQ tablet Commonly known as: KLOR-CON TAKE 2 TABLETS BY MOUTH DAILY. TAKE EXTRA 2 TABLETS WHEN TAKING EXTRA LASIX.   predniSONE 20 MG tablet Commonly known as: DELTASONE Take 2 tablets (40 mg total) by mouth daily with breakfast for 2 days. Start taking on: November 02, 2022   pregabalin 50 MG capsule Commonly known as: LYRICA TAKE 1 CAPSULE BY MOUTH 2 TIMES DAILY   PreserVision AREDS 2+Multi Vit Caps Take 2 capsules by mouth daily.   rosuvastatin 20 MG tablet Commonly known as: CRESTOR TAKE 1 TABLET BY MOUTH DAILY.   silver sulfADIAZINE 1 % cream Commonly known as: Silvadene Apply 1 Application topically 2 (two) times daily.   Stiolto Respimat 2.5-2.5 MCG/ACT Aers Generic drug: Tiotropium Bromide-Olodaterol Inhale 2 puffs into the lungs daily.   torsemide 20 MG tablet Commonly known as: DEMADEX TAKE 2 TABLETS BY MOUTH TWICE  DAILY *DISCONTINUE FUROSEMIDE*   vitamin E 45 MG (100 UNITS) capsule Take 200 Units by mouth daily.   warfarin 2.5 MG tablet Commonly known as: COUMADIN Take as directed. If you are unsure how to take this medication, talk to your nurse or doctor. Original instructions: Take 1 1/2 tablets daily except take 1 tablet on Mondays, Wednesdays and Fridays or as directed by coumadin clinic.         Follow-up Information     Tyler Pita, MD Follow up in 1 week(s).   Specialty: Pulmonary Disease Contact information: [address] [address] [phone]                 Allergies  Allergen Reactions   Shellfish Allergy Anaphylaxis    Other reaction(s): Hallucination   Azithromycin Other (See Comments)    Extreme burning sensation at IV site    Tamiflu [Oseltamivir Phosphate] Other (See Comments)    Reaction:  Hallucinations  Albuterol Palpitations    Heart racing.      The results of significant diagnostics from this hospitalization (including imaging, microbiology, ancillary and laboratory) are listed below for reference.   Consultations:   Procedures/Studies: ECHOCARDIOGRAM COMPLETE  Result Date: 10/31/2022    ECHOCARDIOGRAM REPORT   Patient Name:   BARBERA HINZE Date of Exam: 10/31/2022 Medical Rec #:  [id number]          Height:       65.0 in Accession #:    [id number]         Weight:       124.0 lb Date of Birth:  Jan 11, 1932           BSA:          1.614 m Patient Age:    66 years           BP:           119/49 mmHg Patient Gender: F                  HR:           56 bpm. Exam Location:  [facility] Procedure: 2D Echo, Cardiac Doppler and Color Doppler Indications:     CHF-acute diastolic [id number]  History:         Patient has prior history of Echocardiogram examinations, most                  recent 12/13/2020. CHF, COPD; Risk Factors:Hypertension.  Sonographer:     Sherrie Sport Referring Phys:  [place] Diagnosing Phys: Ida Rogue MD IMPRESSIONS  1. Left ventricular ejection fraction, by estimation, is 60 to 65%. The left ventricle has normal function. The left ventricle has no regional wall motion abnormalities. Left ventricular diastolic parameters are indeterminate.  2. Right ventricular systolic function is normal. The right ventricular size is normal. There is normal pulmonary artery systolic pressure. The estimated right ventricular systolic pressure is 123XX123 mmHg.  3. The mitral valve is normal in structure. Mild to moderate mitral valve regurgitation. Mild to moderate mitral stenosis. The mean mitral valve gradient is 6.0 mmHg. Estimated MVA by P1/2t is 1.23 cm sq  4. The aortic valve is normal in structure. Aortic valve regurgitation is mild. Aortic valve sclerosis is present, with no evidence of aortic valve stenosis.  5. The inferior vena cava is normal in size with greater than 50% respiratory variability, suggesting right atrial pressure of 3 mmHg. FINDINGS  Left Ventricle: Left ventricular ejection fraction, by estimation, is 60 to 65%. The left ventricle has normal function. The left ventricle has no regional wall motion abnormalities. The left ventricular internal cavity size was normal in size. There is  no left ventricular hypertrophy. Left ventricular diastolic parameters are indeterminate. Right Ventricle: The right ventricular size is normal. No increase in right ventricular wall thickness. Right ventricular systolic function is normal. There is normal pulmonary artery systolic pressure. The tricuspid regurgitant velocity is 2.05 m/s, and  with an assumed right atrial pressure of 5 mmHg, the estimated right ventricular systolic pressure is 123XX123 mmHg. Left Atrium: Left atrial size was normal in size. Right Atrium: Right atrial size was normal in size. Pericardium: There is no evidence of pericardial effusion. Mitral Valve: The mitral valve is normal in structure. Mild mitral annular calcification. Mild to moderate  mitral valve regurgitation. Mild to moderate mitral valve stenosis. MV peak gradient, 15.7 mmHg. The mean mitral valve gradient is 6.0 mmHg.  Tricuspid Valve: The tricuspid valve is normal in structure. Tricuspid valve regurgitation is not demonstrated. No evidence of tricuspid stenosis. Aortic Valve: The aortic valve is normal in structure. Aortic valve regurgitation is mild. Aortic valve sclerosis is present, with no evidence of aortic valve stenosis. Aortic valve mean gradient measures 3.0 mmHg. Aortic valve peak gradient measures 5.2  mmHg. Aortic valve area, by VTI measures 2.07 cm. Pulmonic Valve: The pulmonic valve was normal in structure. Pulmonic valve regurgitation is not visualized. No evidence of pulmonic stenosis. Aorta: The aortic root is normal in size and structure. Venous: The inferior vena cava is normal in size with greater than 50% respiratory variability, suggesting right atrial pressure of 3 mmHg. IAS/Shunts: No atrial level shunt detected by color flow Doppler.  LEFT VENTRICLE PLAX 2D LVIDd:         4.20 cm   Diastology LVIDs:         3.10 cm   LV e' medial:    9.79 cm/s LV PW:         0.90 cm   LV E/e' medial:  18.0 LV IVS:        0.80 cm   LV e' lateral:   8.05 cm/s LVOT diam:     2.00 cm   LV E/e' lateral: 21.9 LV SV:         42 LV SV Index:   26 LVOT Area:     3.14 cm  RIGHT VENTRICLE RV Basal diam:  3.20 cm RV Mid diam:    2.60 cm RV S prime:     11.40 cm/s TAPSE (M-mode): 1.5 cm LEFT ATRIUM             Index        RIGHT ATRIUM           Index LA diam:        4.20 cm 2.60 cm/m   RA Area:     18.30 cm LA Vol (A2C):   40.5 ml 25.09 ml/m  RA Volume:   45.40 ml  28.12 ml/m LA Vol (A4C):   52.7 ml 32.64 ml/m LA Biplane Vol: 47.7 ml 29.55 ml/m  AORTIC VALVE AV Area (Vmax):    1.59 cm AV Area (Vmean):   1.54 cm AV Area (VTI):     2.07 cm AV Vmax:           113.67 cm/s AV Vmean:          82.267 cm/s AV VTI:            0.205 m AV Peak Grad:      5.2 mmHg AV Mean Grad:      3.0 mmHg LVOT  Vmax:         57.50 cm/s LVOT Vmean:        40.400 cm/s LVOT VTI:          0.135 m LVOT/AV VTI ratio: 0.66  AORTA Ao Root diam: 2.80 cm MITRAL VALVE                TRICUSPID VALVE MV Area (PHT): 1.24 cm     TR Peak grad:   16.8 mmHg MV Area VTI:   0.67 cm     TR Vmax:        205.00 cm/s MV Peak grad:  15.7 mmHg MV Mean grad:  6.0 mmHg     SHUNTS MV Vmax:       1.98 m/s     Systemic VTI:  0.14 m MV Vmean:      114.0 cm/s   Systemic Diam: 2.00 cm MV Decel Time: 613 msec MV E velocity: 176.00 cm/s MV A velocity: 82.30 cm/s MV E/A ratio:  2.14 Ida Rogue MD Electronically signed by Ida Rogue MD Signature Date/Time: 10/31/2022/11:20:09 AM    Final    DG Chest 2 View  Result Date: 10/30/2022 CLINICAL DATA:  Chest pain.  Cough and congestion. EXAM: CHEST - 2 VIEW COMPARISON:  01/26/22 FINDINGS: Mild cardiac enlargement. Aortic atherosclerosis. Small bilateral pleural effusions and mild interstitial edema. Atelectasis noted in the lung bases. No airspace disease. IMPRESSION: Mild congestive heart failure. Electronically Signed   By: Kerby Moors M.D.   On: 10/30/2022 07:42      Labs: BNP (last 3 results) Recent Labs    01/26/22 1340 10/30/22 0638  BNP 1,854.5* 123456*   Basic Metabolic Panel: Recent Labs  Lab 10/30/22 0638 10/31/22 0532 11/01/22 0440  NA 138 142 139  K 3.4* 3.1* 3.7  CL 106 101 101  CO2 24 26 28   GLUCOSE 126* 128* 119*  BUN 17 26* 29*  CREATININE 0.94 0.95 0.91  CALCIUM 8.5* 8.8* 8.7*  MG  --   --  2.6*   Liver Function Tests: Recent Labs  Lab 10/31/22 0532  AST 17  ALT 13  ALKPHOS 89  BILITOT 0.9  PROT 6.8  ALBUMIN 3.5   No results for input(s): "LIPASE", "AMYLASE" in the last 168 hours. No results for input(s): "AMMONIA" in the last 168 hours. CBC: Recent Labs  Lab 10/30/22 0638 10/31/22 0532 11/01/22 0440  WBC 11.3* 12.5* 13.3*  NEUTROABS 9.0*  --   --   HGB 12.4 11.7* 12.5  HCT 39.6 37.4 39.9  MCV 77.2* 75.7* 75.7*  PLT 146* 210 238    Cardiac Enzymes: No results for input(s): "CKTOTAL", "CKMB", "CKMBINDEX", "TROPONINI" in the last 168 hours. BNP: Invalid input(s): "POCBNP" CBG: No results for input(s): "GLUCAP" in the last 168 hours. D-Dimer No results for input(s): "DDIMER" in the last 72 hours. Hgb A1c No results for input(s): "HGBA1C" in the last 72 hours. Lipid Profile No results for input(s): "CHOL", "HDL", "LDLCALC", "TRIG", "CHOLHDL", "LDLDIRECT" in the last 72 hours. Thyroid function studies No results for input(s): "TSH", "T4TOTAL", "T3FREE", "THYROIDAB" in the last 72 hours.  Invalid input(s): "FREET3" Anemia work up No results for input(s): "VITAMINB12", "FOLATE", "FERRITIN", "TIBC", "IRON", "RETICCTPCT" in the last 72 hours. Urinalysis    Component Value Date/Time   COLORURINE AMBER (A) 09/17/2015 1841   APPEARANCEUR HAZY (A) 09/17/2015 1841   LABSPEC 1.029 09/17/2015 1841   PHURINE 5.0 09/17/2015 1841   GLUCOSEU NEGATIVE 09/17/2015 1841   HGBUR 2+ (A) 09/17/2015 1841   BILIRUBINUR NEGATIVE 09/17/2015 1841   KETONESUR NEGATIVE 09/17/2015 1841   PROTEINUR 100 (A) 09/17/2015 1841   NITRITE NEGATIVE 09/17/2015 1841   LEUKOCYTESUR 1+ (A) 09/17/2015 1841   Sepsis Labs Recent Labs  Lab 10/30/22 0638 10/31/22 0532 11/01/22 0440  WBC 11.3* 12.5* 13.3*   Microbiology Recent Results (from the past 240 hour(s))  Resp panel by RT-PCR (RSV, Flu A&B, Covid) Anterior Nasal Swab     Status: None   Collection Time: 10/30/22  6:38 AM   Specimen: Anterior Nasal Swab  Result Value Ref Range Status   SARS Coronavirus 2 by RT PCR NEGATIVE NEGATIVE Final    Comment: (NOTE) SARS-CoV-2 target nucleic acids are NOT DETECTED.  The SARS-CoV-2 RNA is generally detectable in upper respiratory specimens during the  acute phase of infection. The lowest concentration of SARS-CoV-2 viral copies this assay can detect is 138 copies/mL. A negative result does not preclude SARS-Cov-2 infection and should not be  used as the sole basis for treatment or other patient management decisions. A negative result may occur with  improper specimen collection/handling, submission of specimen other than nasopharyngeal swab, presence of viral mutation(s) within the areas targeted by this assay, and inadequate number of viral copies(<138 copies/mL). A negative result must be combined with clinical observations, patient history, and epidemiological information. The expected result is Negative.  Fact Sheet for Patients:  EntrepreneurPulse.com.au  Fact Sheet for Healthcare Providers:  IncredibleEmployment.be  This test is no t yet approved or cleared by the [place] FDA and  has been authorized for detection and/or diagnosis of SARS-CoV-2 by FDA under an Emergency Use Authorization (EUA). This EUA will remain  in effect (meaning this test can be used) for the duration of the COVID-19 declaration under Section 564(b)(1) of the Act, 21 U.S.C.section 360bbb-3(b)(1), unless the authorization is terminated  or revoked sooner.       Influenza A by PCR NEGATIVE NEGATIVE Final   Influenza B by PCR NEGATIVE NEGATIVE Final    Comment: (NOTE) The Xpert Xpress SARS-CoV-2/FLU/RSV plus assay is intended as an aid in the diagnosis of influenza from Nasopharyngeal swab specimens and should not be used as a sole basis for treatment. Nasal washings and aspirates are unacceptable for Xpert Xpress SARS-CoV-2/FLU/RSV testing.  Fact Sheet for Patients: EntrepreneurPulse.com.au  Fact Sheet for Healthcare Providers: IncredibleEmployment.be  This test is not yet approved or cleared by the [place] FDA and has been authorized for detection and/or diagnosis of SARS-CoV-2 by FDA under an Emergency Use Authorization (EUA). This EUA will remain in effect (meaning this test can be used) for the duration of the COVID-19 declaration under Section  564(b)(1) of the Act, 21 U.S.C. section 360bbb-3(b)(1), unless the authorization is terminated or revoked.     Resp Syncytial Virus by PCR NEGATIVE NEGATIVE Final    Comment: (NOTE) Fact Sheet for Patients: EntrepreneurPulse.com.au  Fact Sheet for Healthcare Providers: IncredibleEmployment.be  This test is not yet approved or cleared by the [place] FDA and has been authorized for detection and/or diagnosis of SARS-CoV-2 by FDA under an Emergency Use Authorization (EUA). This EUA will remain in effect (meaning this test can be used) for the duration of the COVID-19 declaration under Section 564(b)(1) of the Act, 21 U.S.C. section 360bbb-3(b)(1), unless the authorization is terminated or revoked.  Performed at [facility], [place], [place] [address]   Respiratory (~20 pathogens) panel by PCR     Status: None   Collection Time: 10/31/22  8:09 AM   Specimen: Nasopharyngeal Swab; Respiratory  Result Value Ref Range Status   Adenovirus NOT DETECTED NOT DETECTED Final   Coronavirus 229E NOT DETECTED NOT DETECTED Final    Comment: (NOTE) The Coronavirus on the Respiratory Panel, DOES NOT test for the novel  Coronavirus (2019 nCoV)    Coronavirus HKU1 NOT DETECTED NOT DETECTED Final   Coronavirus NL63 NOT DETECTED NOT DETECTED Final   Coronavirus OC43 NOT DETECTED NOT DETECTED Final   Metapneumovirus NOT DETECTED NOT DETECTED Final   Rhinovirus / Enterovirus NOT DETECTED NOT DETECTED Final   Influenza A NOT DETECTED NOT DETECTED Final   Influenza B NOT DETECTED NOT DETECTED Final   Parainfluenza Virus 1 NOT DETECTED NOT DETECTED Final   Parainfluenza Virus 2 NOT DETECTED NOT DETECTED  Final   Parainfluenza Virus 3 NOT DETECTED NOT DETECTED Final   Parainfluenza Virus 4 NOT DETECTED NOT DETECTED Final   Respiratory Syncytial Virus NOT DETECTED NOT DETECTED Final   Bordetella pertussis NOT DETECTED NOT DETECTED  Final   Bordetella Parapertussis NOT DETECTED NOT DETECTED Final   Chlamydophila pneumoniae NOT DETECTED NOT DETECTED Final   Mycoplasma pneumoniae NOT DETECTED NOT DETECTED Final    Comment: Performed at [facility], [place] [address], [place], [place] [address]     Total time spend on discharging this patient, including the last patient exam, discussing the hospital stay, instructions for ongoing care as it relates to all pertinent caregivers, as well as preparing the medical discharge records, prescriptions, and/or referrals as applicable, is 35 minutes.    Enzo Bi, MD  [facility] 11/01/2022, 9:26 AM

## 2022-11-03 ENCOUNTER — Telehealth: Payer: Self-pay | Admitting: *Deleted

## 2022-11-03 ENCOUNTER — Other Ambulatory Visit: Payer: Self-pay | Admitting: Family Medicine

## 2022-11-03 ENCOUNTER — Encounter: Payer: Self-pay | Admitting: Cardiovascular Disease

## 2022-11-03 ENCOUNTER — Encounter: Payer: Self-pay | Admitting: *Deleted

## 2022-11-03 NOTE — Transitions of Care (Post Inpatient/ED Visit) (Signed)
11/03/2022  Name: Molly Hodges MRN: [id number] DOB: [birth year]  Today's TOC FU Call Status: Today's TOC FU Call Status:: Successful TOC FU Call Competed TOC FU Call Complete Date: 11/03/22  Transition Care Management Follow-up Telephone Call Date of Discharge: 11/01/22 Discharge Facility: [facility]) Type of Discharge: Inpatient Admission Primary Inpatient Discharge Diagnosis:: Acute Respiratory Failure; COPD exacerbation How have you been since you were released from the hospital?: Better (per daughter/ caregiver [name], on [facility] DPR: "she is doing much better; not having any problems; our family members live real close by-- soeone checks on her daily) Any questions or concerns?: No  Items Reviewed: Did you receive and understand the discharge instructions provided?: Yes (thoroughly reviewed with patient who verbalizes excellent understanding of same) Medications obtained and verified?: Yes (Medications Reviewed) (Full medication review completed; no concerns; HCTZ discrepancy identified; confirmed patient obtained/ is taking all newly Rx'd medications as instructed; self-manages medications and denies questions/ concerns around medications today)  Chart copied to PCP around need to clarify whether or not patient should be taking HCTZ-- confirmed pt has hospital follow up PCP office visit scheduled 11/07/22  Any new allergies since your discharge?: No Dietary orders reviewed?: Yes Type of Diet Ordered:: "Regular" Do you have support at home?: Yes People in Home: spouse Name of Support/Comfort Primary Source: resides with spouse; patient is independent in self-care activities; family members check on patient daily  Home Care and Equipment/Supplies: [place] Ordered?: No Any new equipment or medical supplies ordered?: No  Functional Questionnaire: Do you need assistance with bathing/showering or dressing?: No Do you need assistance with  meal preparation?: No Do you need assistance with eating?: No Do you have difficulty maintaining continence: No Do you need assistance with getting out of bed/getting out of a chair/moving?: No Do you have difficulty managing or taking your medications?: No (family assists as indicated)  Follow up appointments reviewed: PCP Follow-up appointment confirmed?: Yes Date of PCP follow-up appointment?: 11/07/22 Follow-up Provider: PCP [facility] Follow-up appointment confirmed?: No Reason Specialist Follow-Up Not Confirmed: Patient has Specialist Provider Number and will Call for Appointment Do you need transportation to your follow-up appointment?: No Do you understand care options if your condition(s) worsen?: Yes-patient verbalized understanding  SDOH Interventions Today    Flowsheet Row Most Recent Value  SDOH Interventions   Food Insecurity Interventions Intervention Not Indicated  Transportation Interventions Intervention Not Indicated  [family provides transportation]      TOC Interventions Today    Flowsheet Row Most Recent Value  TOC Interventions   TOC Interventions Discussed/Reviewed TOC Interventions Discussed  [declines need for ongoing/ further care coordination outreach,  no care coordination needs identified at time of TOC call today,  provided my direct contact information should questions/ concerns/ needs arise post-TOC call]      Interventions Today    Flowsheet Row Most Recent Value  Chronic Disease   Chronic disease during today's visit Chronic Obstructive Pulmonary Disease (COPD), Congestive Heart Failure (CHF)  General Interventions   General Interventions Discussed/Reviewed General Interventions Discussed, Doctor Visits, Communication with  Doctor Visits Discussed/Reviewed Doctor Visits Discussed, PCP  PCP/Specialist Visits Compliance with follow-up visit  Communication with PCP/Specialists  [regading need for follow up on whether or not patient  should be taking HCTZ]  Education Interventions   Education Provided Provided Education  Provided Verbal Education On When to see the doctor, Other, Medication  [importance of ongoing monitoring/ recording of daily weights at home: daughter has  very good understanding of same]  Nutrition Interventions   Nutrition Discussed/Reviewed Nutrition Discussed, Decreasing salt  Pharmacy Interventions   Pharmacy Dicussed/Reviewed Pharmacy Topics Discussed  [Full medication review with updating medication list in EHR per patient report]      Oneta Rack, RN, BSN, CCRN Alumnus RN CM Care Coordination/ Transition of [place] Management [phone]: direct office

## 2022-11-06 NOTE — Progress Notes (Signed)
[place] P[place],acting as a scribe for Lelon Huh, MD.,have documented all relevant documentation on the behalf of Lelon Huh, MD,as directed by  Lelon Huh, MD while in the presence of Lelon Huh, MD.     Established patient visit   Patient: Molly Hodges   DOB: [birth year]   89 y.o. Female  MRN: [id number] Visit Date: 11/07/2022  Today's healthcare provider: Lelon Huh, MD   No chief complaint on file.  Subjective    HPI  Follow up Hospitalization  Patient was admitted to [facility] on 10/30/2022 to 11/01/2022 She was treated for shortness of breath.She was diagnosed with COPD exacerbation and noted to have mild CHF on chest XR. She was treated with IV antibiotic, IV furosemide, and steroids.   She reports good compliance with treatment. She reports this condition is improved.  Patient states she feels she is back to her baseline and feels good.  Is back on her pre-admit medications and tolerating well.   She also reports that her daughter was concerned she may have low B12  ----------------------------------------------------------------------------------------- -   Medications: Outpatient Medications Prior to Visit  Medication Sig   Calcium Carbonate-Vitamin D 600-400 MG-UNIT tablet Take 1 tablet by mouth daily.   cholecalciferol (VITAMIN D) 1000 UNITS tablet Take 1,000 Units by mouth daily.   clopidogrel (PLAVIX) 75 MG tablet TAKE 1 TABLET BY MOUTH DAILY   diltiazem (CARDIZEM CD) 180 MG 24 hr capsule TAKE 1 CAPSULE EVERY DAY   dorzolamide (TRUSOPT) 2 % ophthalmic solution Place 1 drop into both eyes 2 (two) times daily.    ezetimibe (ZETIA) 10 MG tablet TAKE 1 TABLET BY MOUTH DAILY.   ipratropium-albuterol (DUONEB) 0.5-2.5 (3) MG/3ML SOLN TAKE 3MLS BY NEBULIZATION EVERY 4 HOURS AS NEEDED   isosorbide mononitrate (IMDUR) 30 MG 24 hr tablet TAKE 1 TABLET BY MOUTH DAILY   latanoprost (XALATAN) 0.005 % ophthalmic solution Place 1 drop into both eyes at  bedtime.    levalbuterol (XOPENEX HFA) 45 MCG/ACT inhaler Inhale 2 puffs into the lungs every 6 (six) hours as needed for wheezing or shortness of breath.   magnesium oxide (MAG-OX) 400 MG tablet Take 400 mg by mouth daily.   Multiple Vitamins-Minerals (PRESERVISION AREDS 2+MULTI VIT) CAPS Take 2 capsules by mouth daily.   potassium chloride (KLOR-CON) 10 MEQ tablet TAKE 2 TABLETS BY MOUTH DAILY. TAKE EXTRA 2 TABLETS WHEN TAKING EXTRA LASIX.   pregabalin (LYRICA) 50 MG capsule TAKE 1 CAPSULE BY MOUTH 2 TIMES DAILY   rosuvastatin (CRESTOR) 20 MG tablet TAKE 1 TABLET BY MOUTH DAILY.   silver sulfADIAZINE (SILVADENE) 1 % cream Apply 1 Application topically 2 (two) times daily.   Tiotropium Bromide-Olodaterol (STIOLTO RESPIMAT) 2.5-2.5 MCG/ACT AERS Inhale 2 puffs into the lungs daily.   torsemide (DEMADEX) 20 MG tablet TAKE 2 TABLETS BY MOUTH TWICE DAILY *DISCONTINUE FUROSEMIDE*   vitamin C (ASCORBIC ACID) 500 MG tablet Take 500 mg by mouth daily.   vitamin E 100 UNIT capsule Take 200 Units by mouth daily.   warfarin (COUMADIN) 2.5 MG tablet Take 1 1/2 tablets daily except take 1 tablet on Mondays, Wednesdays and Fridays or as directed by coumadin clinic.   hydrochlorothiazide (MICROZIDE) 12.5 MG capsule Take 12.5 mg by mouth daily. (Patient not taking: Reported on 11/07/2022)   nitroGLYCERIN (NITROSTAT) 0.4 MG SL tablet Place 1 tablet (0.4 mg total) under the tongue every 5 (five) minutes as needed (for chest pain or shortness of breath).   No facility-administered medications prior to visit.  Review of Systems  Respiratory:  Positive for shortness of breath (only when exerting herself.).   Cardiovascular:  Negative for chest pain, palpitations and leg swelling.  Neurological:  Positive for light-headedness. Negative for dizziness and headaches.       Objective    BP (!) 111/40 (BP Location: Left Arm, Patient Position: Sitting, Cuff Size: Normal)   Pulse 68   Temp 97.7 F (36.5 C) (Oral)    Wt 121 lb (54.9 kg)   SpO2 100%   BMI 20.14 kg/m  (oximetry on room air)   Physical Exam   General: Appearance:    Well developed, well nourished female in no acute distress  Eyes:    PERRL, conjunctiva/corneas clear, EOM's intact       Lungs:     Clear to auscultation bilaterally, respirations unlabored  Heart:    Normal heart rate. Irregularly irregular rhythm.  2/6 blowing, holosystolic murmur at apex 1/6 low pitched, rumbling, decrescendo, diastolic murmur at apex   MS:   All extremities are intact.  No edema.   Neurologic:   Awake, alert, oriented x 3. No apparent focal neurological defect.         Assessment & Plan     1. COPD exacerbation ([place]) Has completed oral steroid, now back to baseline. Continue current medications.   - CBC  2. Acute on chronic diastolic CHF (congestive heart failure) ([place]) Now on 80mg  torsemide twice a day. Checking labs today. Consider Jardiance if BP comes back up to normal range. Follow up cardiology as scheduled.   3. Hypothyroidism, unspecified type  - TSH - T4, free  4. Vitamin D deficiency  - VITAMIN D 25 Hydroxy (Vit-D Deficiency, Fractures)  5. Peripheral polyneuropathy  - Vitamin B12  6. Primary hypertension A bit low today, consider reducing torsemide.  - Comprehensive metabolic panel      The entirety of the information documented in the History of Present Illness, Review of Systems and Physical Exam were personally obtained by me. Portions of this information were initially documented by the CMA and reviewed by me for thoroughness and accuracy.     Lelon Huh, MD  [place] [phone] (phone) [phone] (fax)  [place]

## 2022-11-07 ENCOUNTER — Ambulatory Visit (INDEPENDENT_AMBULATORY_CARE_PROVIDER_SITE_OTHER): Payer: Medicare Other | Admitting: Family Medicine

## 2022-11-07 VITALS — BP 111/40 | HR 68 | Temp 97.7°F | Wt 121.0 lb

## 2022-11-07 DIAGNOSIS — E039 Hypothyroidism, unspecified: Secondary | ICD-10-CM

## 2022-11-07 DIAGNOSIS — E559 Vitamin D deficiency, unspecified: Secondary | ICD-10-CM

## 2022-11-07 DIAGNOSIS — I10 Essential (primary) hypertension: Secondary | ICD-10-CM

## 2022-11-07 DIAGNOSIS — G629 Polyneuropathy, unspecified: Secondary | ICD-10-CM

## 2022-11-07 DIAGNOSIS — I5033 Acute on chronic diastolic (congestive) heart failure: Secondary | ICD-10-CM

## 2022-11-07 DIAGNOSIS — J441 Chronic obstructive pulmonary disease with (acute) exacerbation: Secondary | ICD-10-CM | POA: Diagnosis not present

## 2022-11-09 LAB — COMPREHENSIVE METABOLIC PANEL
ALT: 46 IU/L — ABNORMAL HIGH (ref 0–32)
AST: 29 IU/L (ref 0–40)
Albumin/Globulin Ratio: 1.5 (ref 1.2–2.2)
Albumin: 4 g/dL (ref 3.7–4.7)
Alkaline Phosphatase: 125 IU/L — ABNORMAL HIGH (ref 44–121)
BUN/Creatinine Ratio: 19 (ref 12–28)
BUN: 27 mg/dL (ref 8–27)
Bilirubin Total: 0.4 mg/dL (ref 0.0–1.2)
CO2: 24 mmol/L (ref 20–29)
Calcium: 8.9 mg/dL (ref 8.7–10.3)
Chloride: 100 mmol/L (ref 96–106)
Creatinine, Ser: 1.39 mg/dL — ABNORMAL HIGH (ref 0.57–1.00)
Globulin, Total: 2.6 g/dL (ref 1.5–4.5)
Glucose: 165 mg/dL — ABNORMAL HIGH (ref 70–99)
Potassium: 4.5 mmol/L (ref 3.5–5.2)
Sodium: 141 mmol/L (ref 134–144)
Total Protein: 6.6 g/dL (ref 6.0–8.5)
eGFR: 36 mL/min/{1.73_m2} — ABNORMAL LOW (ref >59)

## 2022-11-09 LAB — CBC
Hematocrit: 43.8 % (ref 34.0–46.6)
Hemoglobin: 13.3 g/dL (ref 11.1–15.9)
MCH: 23.1 pg — ABNORMAL LOW (ref 26.6–33.0)
MCHC: 30.4 g/dL — ABNORMAL LOW (ref 31.5–35.7)
MCV: 76 fL — ABNORMAL LOW (ref 79–97)
Platelets: 311 10*3/uL (ref 150–450)
RBC: 5.76 x10E6/uL — ABNORMAL HIGH (ref 3.77–5.28)
RDW: 16.3 % — ABNORMAL HIGH (ref 11.7–15.4)
WBC: 14.8 10*3/uL — ABNORMAL HIGH (ref 3.4–10.8)

## 2022-11-09 LAB — VITAMIN D 25 HYDROXY (VIT D DEFICIENCY, FRACTURES): Vit D, 25-Hydroxy: 35.4 ng/mL (ref 30.0–100.0)

## 2022-11-09 LAB — TSH: TSH: 3.43 u[IU]/mL (ref 0.450–4.500)

## 2022-11-09 LAB — T4, FREE: Free T4: 1.45 ng/dL (ref 0.82–1.77)

## 2022-11-09 LAB — VITAMIN B12: Vitamin B-12: 954 pg/mL (ref 232–1245)

## 2022-11-12 ENCOUNTER — Telehealth: Payer: Self-pay

## 2022-11-12 NOTE — Telephone Encounter (Signed)
Copied from [place] [id number]. Topic: General - Other >> Nov 12, 2022 11:29 AM Cyndi Bender wrote: Reason for CRM: Pt called for lab results. Pt requests call back at [phone]

## 2022-11-12 NOTE — Telephone Encounter (Signed)
Patient was given labs results and scheduled for 1 month follow-up

## 2022-11-13 ENCOUNTER — Other Ambulatory Visit: Payer: Self-pay | Admitting: Family Medicine

## 2022-11-14 NOTE — Telephone Encounter (Signed)
Requested Prescriptions  Pending Prescriptions Disp Refills   torsemide (DEMADEX) 20 MG tablet [Pharmacy Med Name: TORSEMIDE 20 MG TAB] 120 tablet 0    Sig: TAKE 2 TABLETS BY MOUTH TWICE DAILY *DISCONTINUE FUROSEMIDE*     Cardiovascular:  Diuretics - Loop Failed - 11/13/2022  3:40 PM      Failed - Cr in normal range and within 180 days    Creatinine  Date Value Ref Range Status  05/10/2012 0.99 0.60 - 1.30 mg/dL Final   Creatinine, Ser  Date Value Ref Range Status  11/07/2022 1.39 (H) 0.57 - 1.00 mg/dL Final         Failed - Mg Level in normal range and within 180 days    Magnesium  Date Value Ref Range Status  11/01/2022 2.6 (H) 1.7 - 2.4 mg/dL Final    Comment:    Performed at [facility], [address] [place], [place] [address]         Failed - Last BP in normal range    BP Readings from Last 1 Encounters:  11/07/22 (!) 111/40         Passed - K in normal range and within 180 days    Potassium  Date Value Ref Range Status  11/07/2022 4.5 3.5 - 5.2 mmol/L Final  05/10/2012 3.7 3.5 - 5.1 mmol/L Final         Passed - Ca in normal range and within 180 days    Calcium  Date Value Ref Range Status  11/07/2022 8.9 8.7 - 10.3 mg/dL Final   Calcium, Total  Date Value Ref Range Status  05/10/2012 10.2 (H) 8.5 - 10.1 mg/dL Final         Passed - Na in normal range and within 180 days    Sodium  Date Value Ref Range Status  11/07/2022 141 134 - 144 mmol/L Final  05/10/2012 143 136 - 145 mmol/L Final         Passed - Cl in normal range and within 180 days    Chloride  Date Value Ref Range Status  11/07/2022 100 96 - 106 mmol/L Final  05/10/2012 104 98 - 107 mmol/L Final         Passed - Valid encounter within last 6 months    Recent Outpatient Visits           1 week ago COPD exacerbation ([place])   [place], Donald E, MD   3 months ago [place], Donald E, MD   5  months ago Need for immunization against influenza   [facility] Birdie Sons, MD   1 year ago Moderate persistent asthma, unspecified whether complicated   [place], Donald E, MD   2 years ago [place] Trinna Post, [place]       Future Appointments             In 4 weeks Caryn Section, Kirstie Peri, MD [facility]   In 1 month Gollan, Kathlene November, MD [place] [facility] [place]   In 6 months Ralene Bathe, MD [place]

## 2022-12-03 ENCOUNTER — Ambulatory Visit: Payer: Medicare Other | Attending: Cardiovascular Disease | Admitting: *Deleted

## 2022-12-03 DIAGNOSIS — I050 Rheumatic mitral stenosis: Secondary | ICD-10-CM

## 2022-12-03 DIAGNOSIS — I480 Paroxysmal atrial fibrillation: Secondary | ICD-10-CM

## 2022-12-03 DIAGNOSIS — Z5181 Encounter for therapeutic drug level monitoring: Secondary | ICD-10-CM

## 2022-12-03 LAB — POCT INR: INR: 1.3 — AB (ref 2.0–3.0)

## 2022-12-03 NOTE — Patient Instructions (Signed)
Take warfarin 2 tablets tonight and tomorrow night then resume 1.5 tablets every day, EXCEPT 1 TABLET ON MONDAY, WEDNESDAY AND FRIDAY.   Recheck INR in 2 weeks

## 2022-12-08 ENCOUNTER — Other Ambulatory Visit: Payer: Self-pay | Admitting: Cardiovascular Disease

## 2022-12-08 DIAGNOSIS — H43813 Vitreous degeneration, bilateral: Secondary | ICD-10-CM | POA: Diagnosis not present

## 2022-12-08 DIAGNOSIS — Z961 Presence of intraocular lens: Secondary | ICD-10-CM | POA: Diagnosis not present

## 2022-12-08 DIAGNOSIS — H401131 Primary open-angle glaucoma, bilateral, mild stage: Secondary | ICD-10-CM | POA: Diagnosis not present

## 2022-12-08 DIAGNOSIS — H353132 Nonexudative age-related macular degeneration, bilateral, intermediate dry stage: Secondary | ICD-10-CM | POA: Diagnosis not present

## 2022-12-08 DIAGNOSIS — E785 Hyperlipidemia, unspecified: Secondary | ICD-10-CM

## 2022-12-12 ENCOUNTER — Encounter: Payer: Self-pay | Admitting: Family Medicine

## 2022-12-12 ENCOUNTER — Ambulatory Visit (INDEPENDENT_AMBULATORY_CARE_PROVIDER_SITE_OTHER): Payer: Medicare Other | Admitting: Family Medicine

## 2022-12-12 VITALS — BP 136/66 | HR 66 | Ht 65.0 in | Wt 126.0 lb

## 2022-12-12 DIAGNOSIS — R739 Hyperglycemia, unspecified: Secondary | ICD-10-CM | POA: Diagnosis not present

## 2022-12-12 DIAGNOSIS — I5033 Acute on chronic diastolic (congestive) heart failure: Secondary | ICD-10-CM

## 2022-12-12 LAB — POCT GLYCOSYLATED HEMOGLOBIN (HGB A1C)
Est. average glucose Bld gHb Est-mCnc: 151
Hemoglobin A1C: 6.9 % — AB (ref 4.0–5.6)

## 2022-12-12 NOTE — Progress Notes (Signed)
Established patient visit   Patient: Molly Hodges   DOB: [birth year]   89 y.o. Female  MRN: [id number] Visit Date: 12/12/2022  Today's healthcare provider: Mila Merry, MD   Chief Complaint  Patient presents with   Follow-up   Subjective    HPI  Here to follow up on CHF and hyperglycemia at recent labs.  Lab Results  Component Value Date   NA 141 11/07/2022   K 4.5 11/07/2022   CREATININE 1.39 (H) 11/07/2022   EGFR 36 (L) 11/07/2022   GFRNONAA >60 11/01/2022   GLUCOSE 165 (H) 11/07/2022   She feels well on current medications. She has follow up with Dr. Mariah Milling  Medications: Outpatient Medications Prior to Visit  Medication Sig   Calcium Carbonate-Vitamin D 600-400 MG-UNIT tablet Take 1 tablet by mouth daily.   cholecalciferol (VITAMIN D) 1000 UNITS tablet Take 1,000 Units by mouth daily.   clopidogrel (PLAVIX) 75 MG tablet TAKE 1 TABLET BY MOUTH DAILY   diltiazem (CARDIZEM CD) 180 MG 24 hr capsule TAKE 1 CAPSULE EVERY DAY   dorzolamide (TRUSOPT) 2 % ophthalmic solution Place 1 drop into both eyes 2 (two) times daily.    ezetimibe (ZETIA) 10 MG tablet TAKE 1 TABLET BY MOUTH DAILY.   ipratropium-albuterol (DUONEB) 0.5-2.5 (3) MG/3ML SOLN TAKE BY NEBULIZATION EVERY 4 HOURS AS NEEDED   isosorbide mononitrate (IMDUR) 30 MG 24 hr tablet TAKE 1 TABLET BY MOUTH DAILY   latanoprost (XALATAN) 0.005 % ophthalmic solution Place 1 drop into both eyes at bedtime.    levalbuterol (XOPENEX HFA) 45 MCG/ACT inhaler Inhale 2 puffs into the lungs every 6 (six) hours as needed for wheezing or shortness of breath.   magnesium oxide (MAG-OX) 400 MG tablet Take 400 mg by mouth daily.   Multiple Vitamins-Minerals (PRESERVISION AREDS 2+MULTI VIT) CAPS Take 2 capsules by mouth daily.   potassium chloride (KLOR-CON) 10 MEQ tablet TAKE 2 TABLETS BY MOUTH DAILY. TAKE EXTRA 2 TABLETS WHEN TAKING EXTRA LASIX.   pregabalin (LYRICA) 50 MG capsule TAKE 1 CAPSULE BY MOUTH 2 TIMES DAILY    rosuvastatin (CRESTOR) 20 MG tablet TAKE 1 TABLET BY MOUTH DAILY.   silver sulfADIAZINE (SILVADENE) 1 % cream Apply 1 Application topically 2 (two) times daily.   Tiotropium Bromide-Olodaterol (STIOLTO RESPIMAT) 2.5-2.5 MCG/ACT AERS Inhale 2 puffs into the lungs daily.   torsemide (DEMADEX) 20 MG tablet TAKE 2 TABLETS BY MOUTH TWICE DAILY *DISCONTINUE FUROSEMIDE*   vitamin C (ASCORBIC ACID) 500 MG tablet Take 500 mg by mouth daily.   vitamin E 100 UNIT capsule Take 200 Units by mouth daily.   warfarin (COUMADIN) 2.5 MG tablet Take 1 1/2 tablets daily except take 1 tablet on Mondays, Wednesdays and Fridays or as directed by coumadin clinic.   nitroGLYCERIN (NITROSTAT) 0.4 MG SL tablet Place 1 tablet (0.4 mg total) under the tongue every 5 (five) minutes as needed (for chest pain or shortness of breath).   No facility-administered medications prior to visit.    Review of Systems  Constitutional:  Negative for appetite change, chills, fatigue and fever.  Respiratory:  Negative for chest tightness and shortness of breath.   Cardiovascular:  Negative for chest pain and palpitations.  Gastrointestinal:  Negative for abdominal pain, nausea and vomiting.  Neurological:  Negative for dizziness and weakness.       Objective    BP 136/66   Pulse 66   Ht 5\' 5"  (1.651 m)   Wt 126 lb (57.2  kg)   BMI 20.97 kg/m    Physical Exam  General Appearance:    Well developed, well nourished female, alert, cooperative, in no acute distress  HENT:   ENT exam normal, no neck nodes or sinus tenderness  Eyes:    PERRL, conjunctiva/corneas clear, EOM's intact       Lungs:     Clear to auscultation bilaterally, respirations unlabored  Heart:    Normal heart rate. Irregularly irregular rhythm.  2/6 blowing, holosystolic murmur at apex 1/6 low pitched, rumbling, decrescendo, diastolic murmur at apex   Neurologic:   Awake, alert, oriented x 3. No apparent focal neurological           defect.        Results  for orders placed or performed in visit on 12/12/22  POCT HgB A1C  Result Value Ref Range   Hemoglobin A1C 6.9 (A) 4.0 - 5.6 %   Est. average glucose Bld gHb Est-mCnc 151     Assessment & Plan     1. Hyperglycemia Exacerbated by hospitalization last month on IVF and steroid. She will work on avoid sweets and white starchy foods in diet. Recheck A1c in 4 months.   2. Acute on chronic diastolic CHF (congestive heart failure) (H[facility]) Symptomatically well compensated. Continue current medications.  Follow up cardiology in May as scheduled.  - Renal function panel   Return in about 4 months (around 04/13/2023) for prediabetes.         Mila Merry, MD  [facility] [phone] (phone) [phone] (fax)  [facility]

## 2022-12-12 NOTE — Patient Instructions (Signed)
.   Please review the attached list of medications and notify my office if there are any errors.   . Please bring all of your medications to every appointment so we can make sure that our medication list is the same as yours.   

## 2022-12-13 LAB — RENAL FUNCTION PANEL
Albumin: 4.4 g/dL (ref 3.7–4.7)
BUN/Creatinine Ratio: 17 (ref 12–28)
BUN: 19 mg/dL (ref 8–27)
CO2: 19 mmol/L — ABNORMAL LOW (ref 20–29)
Calcium: 9 mg/dL (ref 8.7–10.3)
Chloride: 104 mmol/L (ref 96–106)
Creatinine, Ser: 1.13 mg/dL — ABNORMAL HIGH (ref 0.57–1.00)
Glucose: 77 mg/dL (ref 70–99)
Phosphorus: 4 mg/dL (ref 3.0–4.3)
Potassium: 4.2 mmol/L (ref 3.5–5.2)
Sodium: 145 mmol/L — ABNORMAL HIGH (ref 134–144)
eGFR: 47 mL/min/{1.73_m2} — ABNORMAL LOW (ref >59)

## 2022-12-17 ENCOUNTER — Ambulatory Visit: Payer: Medicare Other | Attending: Cardiovascular Disease | Admitting: *Deleted

## 2022-12-17 DIAGNOSIS — I480 Paroxysmal atrial fibrillation: Secondary | ICD-10-CM | POA: Diagnosis not present

## 2022-12-17 DIAGNOSIS — I050 Rheumatic mitral stenosis: Secondary | ICD-10-CM | POA: Diagnosis not present

## 2022-12-17 DIAGNOSIS — Z5181 Encounter for therapeutic drug level monitoring: Secondary | ICD-10-CM | POA: Diagnosis not present

## 2022-12-17 LAB — POCT INR: INR: 2 (ref 2.0–3.0)

## 2022-12-17 NOTE — Patient Instructions (Signed)
Continue warfarin 1.5 tablets every day, EXCEPT 1 TABLET ON MONDAY, WEDNESDAY AND FRIDAY.   Recheck INR in 3 weeks

## 2022-12-22 ENCOUNTER — Other Ambulatory Visit (INDEPENDENT_AMBULATORY_CARE_PROVIDER_SITE_OTHER): Payer: Self-pay | Admitting: Vascular Surgery

## 2022-12-22 ENCOUNTER — Other Ambulatory Visit: Payer: Self-pay | Admitting: Family Medicine

## 2022-12-22 ENCOUNTER — Other Ambulatory Visit: Payer: Self-pay | Admitting: Cardiovascular Disease

## 2022-12-22 DIAGNOSIS — I6523 Occlusion and stenosis of bilateral carotid arteries: Secondary | ICD-10-CM

## 2022-12-23 ENCOUNTER — Encounter (INDEPENDENT_AMBULATORY_CARE_PROVIDER_SITE_OTHER): Payer: Self-pay | Admitting: Vascular Surgery

## 2022-12-23 ENCOUNTER — Ambulatory Visit (INDEPENDENT_AMBULATORY_CARE_PROVIDER_SITE_OTHER): Payer: Medicare Other

## 2022-12-23 ENCOUNTER — Ambulatory Visit (INDEPENDENT_AMBULATORY_CARE_PROVIDER_SITE_OTHER): Payer: Medicare Other | Admitting: Vascular Surgery

## 2022-12-23 VITALS — BP 93/56 | HR 81 | Resp 18 | Ht 65.0 in | Wt 126.2 lb

## 2022-12-23 DIAGNOSIS — I10 Essential (primary) hypertension: Secondary | ICD-10-CM | POA: Diagnosis not present

## 2022-12-23 DIAGNOSIS — I6523 Occlusion and stenosis of bilateral carotid arteries: Secondary | ICD-10-CM

## 2022-12-23 DIAGNOSIS — E782 Mixed hyperlipidemia: Secondary | ICD-10-CM

## 2022-12-23 NOTE — Progress Notes (Signed)
MRN : [id number]  Molly Hodges is a 89 y.o. (09-Jul-1932) female who presents with chief complaint of  Chief Complaint  Patient presents with   Follow-up    1 year follow up   .  History of Present Illness: Patient returns in follow-up of her carotid disease.  She is doing well today.  She denies any focal motor symptoms. Specifically, the patient denies amaurosis fugax, speech or swallowing difficulties, or arm or leg weakness or numbness.  She is about 5 years status post right carotid stent placement for high-grade stenosis.  Duplex today shows her right carotid stent to be widely patent in her left carotid artery to have stable velocities in the 40 to 59% range.  Current Outpatient Medications  Medication Sig Dispense Refill   Calcium Carbonate-Vitamin D 600-400 MG-UNIT tablet Take 1 tablet by mouth daily.     cholecalciferol (VITAMIN D) 1000 UNITS tablet Take 1,000 Units by mouth daily.     clopidogrel (PLAVIX) 75 MG tablet TAKE 1 TABLET BY MOUTH DAILY 90 tablet 3   diltiazem (CARDIZEM CD) 180 MG 24 hr capsule TAKE 1 CAPSULE EVERY DAY 30 capsule 11   dorzolamide (TRUSOPT) 2 % ophthalmic solution Place 1 drop into both eyes 2 (two) times daily.      ezetimibe (ZETIA) 10 MG tablet TAKE 1 TABLET BY MOUTH DAILY 90 tablet 0   ipratropium-albuterol (DUONEB) 0.5-2.5 (3) MG/3ML SOLN TAKE BY NEBULIZATION EVERY 4 HOURS AS NEEDED 360 mL 1   isosorbide mononitrate (IMDUR) 30 MG 24 hr tablet TAKE 1 TABLET BY MOUTH DAILY 90 tablet 2   latanoprost (XALATAN) 0.005 % ophthalmic solution Place 1 drop into both eyes at bedtime.      levalbuterol (XOPENEX HFA) 45 MCG/ACT inhaler Inhale 2 puffs into the lungs every 6 (six) hours as needed for wheezing or shortness of breath. 1 each 3   magnesium oxide (MAG-OX) 400 MG tablet Take 400 mg by mouth daily.     Multiple Vitamins-Minerals (PRESERVISION AREDS 2+MULTI VIT) CAPS Take 2 capsules by mouth daily.     nitroGLYCERIN (NITROSTAT) 0.4 MG SL  tablet Place 1 tablet (0.4 mg total) under the tongue every 5 (five) minutes as needed (for chest pain or shortness of breath). 100 tablet 3   potassium chloride (KLOR-CON) 10 MEQ tablet TAKE 2 TABLETS BY MOUTH DAILY. TAKE EXTRA 2 TABLETS WHEN TAKING EXTRA LASIX. 200 tablet 0   pregabalin (LYRICA) 50 MG capsule TAKE 1 CAPSULE BY MOUTH 2 TIMES DAILY 60 capsule 5   rosuvastatin (CRESTOR) 20 MG tablet TAKE 1 TABLET BY MOUTH DAILY. 90 tablet 0   silver sulfADIAZINE (SILVADENE) 1 % cream Apply 1 Application topically 2 (two) times daily. 50 g 1   Tiotropium Bromide-Olodaterol (STIOLTO RESPIMAT) 2.5-2.5 MCG/ACT AERS Inhale 2 puffs into the lungs daily. 1 each 5   torsemide (DEMADEX) 20 MG tablet TAKE 2 TABLETS BY MOUTH TWICE DAILY *DISCONTINUE FUROSEMIDE* 120 tablet 0   vitamin C (ASCORBIC ACID) 500 MG tablet Take 500 mg by mouth daily.     vitamin E 100 UNIT capsule Take 200 Units by mouth daily.     warfarin (COUMADIN) 2.5 MG tablet Take 1 1/2 tablets daily except take 1 tablet on Mondays, Wednesdays and Fridays or as directed by coumadin clinic. 40 tablet 5   No current facility-administered medications for this visit.    Past Medical History:  Diagnosis Date   Aortic atherosclerosis (HCC)    a. 05/2021 TEE: GrIII  atheroma plaque involving the asc, transverse, and desc Ao.   Asthma    CAD (coronary artery disease)    a. 04/2021 Cath: LM nl, LAD 85p/m, D1 80, RI nl, LCX nl, RCA nl; b. 07/2021 PCI: pLAD (2.75x26 Onyx Frontier DES), D1 (2.5x22 Onyx Frontier DES).   Carotid artery disease (HCC)    a. s/p R carotid stenting (9mm x 7mm x 4cm long Exact stent); b. 06/2021 U/S: RICA 40-59%, LICA 40-59%.   CHF (congestive heart failure) (HCC)    Community acquired pneumonia    Essential hypertension    Glaucoma    Hyperlipidemia    Mitral regurgitation    a. TTE 08/2015: EF 60-65%, normal wall motion, mild MR, mildly dilated left atrium measuring 40 mm, RVSF normal, PASP normal; b. 05/2021 TEE:  Moderate MR.   Mitral stenosis    a. 05/2021 L/RHC: Sev MS w/ mean grad 13-61mmHg and MV area of 0.5-06.cm^2; b. 05/2021 TEE: EF 55-60%, no rwma, nl RV fxn, mod MR, mod MS (MV area by P1/2t: 1.61 cm^2 w/ mean grad of ).   Peripheral neuropathy    Persistent atrial fibrillation (HCC) 09/17/2015   a. s/p DCCV 11/15/2015; b. CHADS2VASc => 4 (HTN, age x 2, female)--> warfarin; c. 06/2021 recurrent afib-->amio added.   Squamous cell carcinoma of skin 12/18/2021   R dorsum hand, EDC   Squamous cell carcinoma of skin 12/31/2021   R hand dorsum, recurrent - excised 02/04/2022   Squamous cell carcinoma of skin 12/31/2021   L forearm - ED&C   Squamous cell carcinoma of skin 06/24/2022   R thumb webspace with wart virus - ED&C   Squamous cell carcinoma of skin 10/20/2022   L lat knee - tx with ED&C    Past Surgical History:  Procedure Laterality Date   ABDOMINAL HYSTERECTOMY  1987   due to heavy bleeding   APPENDECTOMY     CARDIAC CATHETERIZATION     CAROTID PTA/STENT INTERVENTION Right 08/15/2020   Procedure: CAROTID PTA/STENT INTERVENTION;  Surgeon: Annice Needy, MD;  Location: [facility]IVE CV [facility];  Service: Cardiovascular;  Laterality: Right;   CATARACT EXTRACTION     CORONARY IMAGING/OCT N/A 08/02/2021   Procedure: INTRAVASCULAR IMAGING/OCT;  Surgeon: Yvonne Kendall, MD;  Location: [facility];  Service: Cardiovascular;  Laterality: N/A;   CORONARY STENT INTERVENTION N/A 08/02/2021   Procedure: CORONARY STENT INTERVENTION;  Surgeon: Yvonne Kendall, MD;  Location: [facility];  Service: Cardiovascular;  Laterality: N/A;   ELECTROPHYSIOLOGIC STUDY N/A 11/15/2015   Procedure: CARDIOVERSION;  Surgeon: Antonieta Iba, MD;  Location: [facility] ORS;  Service: Cardiovascular;  Laterality: N/A;   LEFT HEART CATH AND CORONARY ANGIOGRAPHY N/A 08/02/2021   Procedure: LEFT HEART CATH AND CORONARY ANGIOGRAPHY;  Surgeon: Yvonne Kendall, MD;  Location: [facility];  Service:  Cardiovascular;  Laterality: N/A;   LEFT HEART CATH AND CORONARY ANGIOGRAPHY N/A 08/07/2021   Procedure: LEFT HEART CATH AND CORONARY ANGIOGRAPHY;  Surgeon: Iran Ouch, MD;  Location: [facility];  Service: Cardiovascular;  Laterality: N/A;   RIGHT/LEFT HEART CATH AND CORONARY ANGIOGRAPHY N/A 05/17/2021   Procedure: RIGHT/LEFT HEART CATH AND CORONARY ANGIOGRAPHY;  Surgeon: Yvonne Kendall, MD;  Location: [facility];  Service: Cardiovascular;  Laterality: N/A;   TEE WITHOUT CARDIOVERSION N/A 06/13/2021   Procedure: TRANSESOPHAGEAL ECHOCARDIOGRAM (TEE);  Surgeon: Antonieta Iba, MD;  Location: [facility] OR[facility];  Service: Cardiovascular;  Laterality: N/A;     Social History  Tobacco Use   Smoking status: Never   Smokeless tobacco: Never   Tobacco comments:    never   Vaping Use   Vaping Use: Never used  Substance Use Topics   Alcohol use: No   Drug use: Never      Family History  Problem Relation Age of Onset   CAD Mother    CAD Father    Cancer Son 50       lung cancer     Allergies  Allergen Reactions   Shellfish Allergy Anaphylaxis    Other reaction(s): Hallucination   Azithromycin Other (See Comments)    Extreme burning sensation at IV site    Tamiflu [Oseltamivir Phosphate] Other (See Comments)    Reaction:  Hallucinations     Albuterol Palpitations    Heart racing.      REVIEW OF SYSTEMS (Negative unless checked)  Constitutional: [] Weight loss  [] Fever  [] Chills Cardiac: [] Chest pain   [] Chest pressure   [] Palpitations   [] Shortness of breath when laying flat   [] Shortness of breath at rest   [] Shortness of breath with exertion. Vascular:  [] Pain in legs with walking   [] Pain in legs at rest   [] Pain in legs when laying flat   [] Claudication   [] Pain in feet when walking  [] Pain in feet at rest  [] Pain in feet when laying flat   [] History of DVT   [] Phlebitis   [] Swelling in legs   [] Varicose veins   [] Non-healing ulcers Pulmonary:    [] Uses home oxygen   [] Productive cough   [] Hemoptysis   [] Wheeze  [] COPD   [] Asthma Neurologic:  [x] Dizziness  [] Blackouts   [] Seizures   [] History of stroke   [] History of TIA  [] Aphasia   [] Temporary blindness   [] Dysphagia   [] Weakness or numbness in arms   [] Weakness or numbness in legs Musculoskeletal:  [x] Arthritis   [] Joint swelling   [x] Joint pain   [] Low back pain Hematologic:  [] Easy bruising  [] Easy bleeding   [] Hypercoagulable state   [] Anemic  [] Hepatitis Gastrointestinal:  [] Blood in stool   [] Vomiting blood  [] Gastroesophageal reflux/heartburn   [] Difficulty swallowing. Genitourinary:  [] Chronic kidney disease   [] Difficult urination  [] Frequent urination  [] Burning with urination   [] Blood in urine Skin:  [] Rashes   [] Ulcers   [] Wounds Psychological:  [] History of anxiety   []  History of major depression.  Physical Examination  Vitals:   12/23/22 1050  BP: (!) 93/56  Pulse: 81  Resp: 18  Weight: 126 lb 3.2 oz (57.2 kg)  Height: 5\' 5"  (1.651 m)   Body mass index is 21 kg/m. Gen:  WD/WN, NAD Head: [place]/AT, No temporalis wasting. Ear/Nose/Throat: Hearing grossly intact, nares w/o erythema or drainage, trachea midline Eyes: Conjunctiva clear. Sclera non-icteric Neck: Supple.  No bruit  Pulmonary:  Good air movement, equal and clear to auscultation bilaterally.  Cardiac: RRR, No JVD Vascular:  Vessel Right Left  Radial Palpable Palpable       Musculoskeletal: M/S 5/5 throughout.  No deformity or atrophy. No edema. Neurologic: CN 2-12 intact. Sensation grossly intact in extremities.  Symmetrical.  Speech is fluent. Motor exam as listed above. Psychiatric: Judgment intact, Mood & affect appropriate for pt's clinical situation. Dermatologic: No rashes or ulcers noted.      CBC Lab Results  Component Value Date   WBC 14.8 (H) 11/07/2022   HGB 13.3 11/07/2022   HCT 43.8 11/07/2022   MCV 76 (L) 11/07/2022   PLT 311 11/07/2022  BMET    Component Value  Date/Time   NA 145 (H) 12/12/2022 1053   NA 143 05/10/2012 1100   K 4.2 12/12/2022 1053   K 3.7 05/10/2012 1100   CL 104 12/12/2022 1053   CL 104 05/10/2012 1100   CO2 19 (L) 12/12/2022 1053   CO2 29 05/10/2012 1100   GLUCOSE 77 12/12/2022 1053   GLUCOSE 119 (H) 11/01/2022 0440   GLUCOSE 106 (H) 05/10/2012 1100   BUN 19 12/12/2022 1053   BUN 17 05/10/2012 1100   CREATININE 1.13 (H) 12/12/2022 1053   CREATININE 0.99 05/10/2012 1100   CALCIUM 9.0 12/12/2022 1053   CALCIUM 10.2 (H) 05/10/2012 1100   GFRNONAA >60 11/01/2022 0440   GFRNONAA 55 (L) 05/10/2012 1100   GFRAA 57 (L) 09/17/2020 1051   GFRAA >60 05/10/2012 1100   Estimated Creatinine Clearance: 30.4 mL/min (A) (by C-G formula based on SCr of 1.13 mg/dL (H)).  COAG Lab Results  Component Value Date   INR 2.0 12/17/2022   INR 1.3 (A) 12/03/2022   INR 2.8 (H) 11/01/2022    Radiology No results found.   Assessment/Plan Carotid stenosis Duplex today shows her right carotid stent to be widely patent in her left carotid artery to have stable velocities in the 40 to 59% range.  Doing well.  Continue current medical regimen.  Recheck in 1 year.  Hypertension blood pressure control important in reducing the progression of atherosclerotic disease. On appropriate oral medications.     Hyperlipidemia lipid control important in reducing the progression of atherosclerotic disease. Continue statin therapy  Festus Barren, MD  12/23/2022 12:15 PM    This note was created with Dragon medical transcription system.  Any errors from dictation are purely unintentional

## 2022-12-23 NOTE — Telephone Encounter (Signed)
Requested medication (s) are due for refill today:   Yes for Duoneb and Demadex;  Lyrica provider to review  Requested medication (s) are on the active medication list:   Yes for all 3  Future visit scheduled:   No   Just seen 1 wk ago by Dr. Sherrie Mustache   Last ordered: Duoneb 10/23/2021 360 ml, 1 refill;   Lyrica 07/13/2022 #60, 5 refills;   Demadex 11/04/2022 #120, 0 refills  Returned due to Lyrica being a non delegated refill    Requested Prescriptions  Pending Prescriptions Disp Refills   ipratropium-albuterol (DUONEB) 0.5-2.5 (3) MG/3ML SOLN [Pharmacy Med Name: IPRATROPIUM-ALBUTEROL 0.5-2.5 (3) M] 360 mL 1    Sig: TAKE BY NEBULIZATION EVERY 4 HOURS AS NEEDED     Pulmonology:  Combination Products - albuterol / ipratropium Passed - 12/22/2022 10:46 AM      Passed - Last BP in normal range    BP Readings from Last 1 Encounters:  12/23/22 (!) 93/56         Passed - Last Heart Rate in normal range    Pulse Readings from Last 1 Encounters:  12/23/22 81         Passed - Valid encounter within last 12 months    Recent Outpatient Visits           1 week ago Hyperglycemia   [facility] Malva Limes, MD   1 month ago COPD exacerbation [facility])   [place] [facility] Malva Limes, MD   5 months ago COVID-19   [facility] Sherrie Mustache, Demetrios Isaacs, MD   6 months ago Need for immunization against influenza   [facility] Malva Limes, MD   1 year ago Moderate persistent asthma, unspecified whether complicated   [place] [facility] Malva Limes, MD       Future Appointments             In 2 weeks Gollan, Tollie Pizza, MD [facility] [place]   In 4 months Deirdre Evener, MD [facility]             pregabalin (LYRICA) 50 MG capsule [Pharmacy Med Name: PREGABALIN 50 MG CAP] 60 capsule     Sig: TAKE 1 CAPSULE BY MOUTH 2  TIMES DAILY     Not Delegated - Neurology:  Anticonvulsants - Controlled - pregabalin Failed - 12/22/2022 10:46 AM      Failed - This refill cannot be delegated      Failed - Cr in normal range and within 360 days    Creatinine  Date Value Ref Range Status  05/10/2012 0.99 0.60 - 1.30 mg/dL Final   Creatinine, Ser  Date Value Ref Range Status  12/12/2022 1.13 (H) 0.57 - 1.00 mg/dL Final         Passed - Completed PHQ-2 or PHQ-9 in the last 360 days      Passed - Valid encounter within last 12 months    Recent Outpatient Visits           1 week ago Hyperglycemia   [place] [facility] Malva Limes, MD   1 month ago COPD exacerbation [facility])   [place] [facility] Malva Limes, MD   5 months ago COVID-19   [facility] Malva Limes, MD   6 months ago Need for immunization  against influenza   [facility] Malva Limes, MD   1 year ago Moderate persistent asthma, unspecified whether complicated   [place] [facility] Malva Limes, MD       Future Appointments             In 2 weeks Gollan, Tollie Pizza, MD [facility] [place]   In 4 months Deirdre Evener, MD [place] [facility]             torsemide [facility]) 20 MG tablet [Pharmacy Med Name: TORSEMIDE 20 MG TAB] 120 tablet 0    Sig: TAKE 2 TABLETS BY MOUTH TWICE DAILY *DISCONTINUE FUROSEMIDE*     Cardiovascular:  Diuretics - Loop Failed - 12/22/2022 10:46 AM      Failed - Na in normal range and within 180 days    Sodium  Date Value Ref Range Status  12/12/2022 145 (H) 134 - 144 mmol/L Final  05/10/2012 143 136 - 145 mmol/L Final         Failed - Cr in normal range and within 180 days    Creatinine  Date Value Ref Range Status  05/10/2012 0.99 0.60 - 1.30 mg/dL Final   Creatinine, Ser  Date Value Ref Range Status  12/12/2022 1.13 (H) 0.57 - 1.00 mg/dL Final          Failed - Mg Level in normal range and within 180 days    Magnesium  Date Value Ref Range Status  11/01/2022 2.6 (H) 1.7 - 2.4 mg/dL Final    Comment:    Performed at [facility], [address] [place], [place] [address]         Passed - K in normal range and within 180 days    Potassium  Date Value Ref Range Status  12/12/2022 4.2 3.5 - 5.2 mmol/L Final  05/10/2012 3.7 3.5 - 5.1 mmol/L Final         Passed - Ca in normal range and within 180 days    Calcium  Date Value Ref Range Status  12/12/2022 9.0 8.7 - 10.3 mg/dL Final   Calcium, Total  Date Value Ref Range Status  05/10/2012 10.2 (H) 8.5 - 10.1 mg/dL Final         Passed - Cl in normal range and within 180 days    Chloride  Date Value Ref Range Status  12/12/2022 104 96 - 106 mmol/L Final  05/10/2012 104 98 - 107 mmol/L Final         Passed - Last BP in normal range    BP Readings from Last 1 Encounters:  12/23/22 (!) 93/56         Passed - Valid encounter within last 6 months    Recent Outpatient Visits           1 week ago Hyperglycemia   [place] [facility] Malva Limes, MD   1 month ago COPD exacerbation [facility])   [place] [facility] Malva Limes, MD   5 months ago COVID-19   [facility] Malva Limes, MD   6 months ago Need for immunization against influenza   [facility] Malva Limes, MD   1 year ago Moderate persistent asthma, unspecified whether complicated   [facility] Malva Limes, MD       Future Appointments  In 2 weeks Gollan, Tollie Pizza, MD [facility] [place]   In 4 months Deirdre Evener, MD [facility]

## 2022-12-23 NOTE — Assessment & Plan Note (Signed)
Duplex today shows her right carotid stent to be widely patent in her left carotid artery to have stable velocities in the 40 to 59% range.  Doing well.  Continue current medical regimen.  Recheck in 1 year.

## 2023-01-05 ENCOUNTER — Other Ambulatory Visit: Payer: Self-pay | Admitting: Cardiovascular Disease

## 2023-01-06 NOTE — Progress Notes (Unsigned)
Cardiology Office Note  Date:  01/07/2023   ID:  Kynzlee, Kassner [birth year], MRN [id number]  PCP:  Malva Limes, MD   Chief Complaint  Patient presents with   Fatigue    HPI:  Molly Hodges is a 89 y.o. female with  Paroxysmal Afib,  (CHADS2VASc of 4 - HTN, age x 2, female)  COPD, (never smoked) asthma, prior episodes of PNA,  hospital admission from 1/30 to 09/21/15,  HTN  Mitral valve stenosis, moderate December 2021 Pericardial effusion mild to moderate December 2021 PAD, carotid stenosis, high-grade on the right, stent placed 2021 Chronic dizziness 05/2020 started, last couple of weeks at a time, seen by ENT/neurology Coronary artery disease, catheterization December 2022 stent to mid LAD who presents  Follow-up for her atrial fibrillation,Previous cardioversion, carotid stenosis, CAD with PCI, mitral valve stenosis  Last seen by myself in clinic November 2023  Echo 09/01/2022 Mild to moderate MS, mild to moderate MR Normal ejection fraction 60% normal RV size and function normal right heart pressures  In follow-up today she reports everything is relatively stable Sometimes with orthostasis symptoms, some balance issues Denies tachypalpitations, denies shortness of breath on exertion Not using a walker today Gait stability exacerbated by neuropathy in her feet, previously tried gabapentin, did not help chronic dizziness  Not bothered by permanent atrial fibrillation No significant PND orthopnea, no lower extremity edema, weight stable on torsemide 40 twice daily  Lab work reviewed A1C 6.9 CR 1.1  EKG personally reviewed by myself on todays visit Atrial fibrillation R ventricular rate 63 bpm no significant ST-T wave changes  Other past medical history reviewed Normal sinus rhythm October 2022 atrial fibrillation November 2022 Has remained in atrial fibrillation since that time Bronchitis/pneumonia January 2023 requiring oxygen   Has periodic  follow-up with pulmonary, reports that she was released Remains on high-dose diuretic, torsemide 40 twice daily  no leg swelling, no recent lab work since June 2023  Recent imaging discussed 5/23 Right Carotid: Velocities in the right ICA are consistent with a 40-59% stenosis.  Left Carotid: Velocities in the left ICA are consistent with a 40-59%  stenosis.   Cath 08/02/21  Severe single-vessel coronary artery disease with 80-90% ostial through mid LAD and proximal/mid D1 stenoses, as seen on recent diagnostic catheterization. Successful OCT-guided PCI to ostial through mid LAD using Onyx Frontier 2.75 x 26 mm drug-eluting stent with 0% residual stenosis and TIMI-3 flow. Successful PCI to D1 using Onyx Frontier 2.5 x 22 mm drug-eluting stent with 0% residual stenosis and TIMI-3 flow.  Cath 08/07/2021 for chest pain/bronchitis   1st Diag-1 lesion is 20% stenosed.   Non-stenotic Ost LAD to Mid LAD lesion was previously treated.   Non-stenotic 1st Diag-2 lesion was previously treated.   1.  Widely patent LAD/diagonal stents with no significant restenosis.    In the ER 08/23/21 for chest pain, enz neg Treated for bronchitis  Echocardiogram July 25, 2020  EF 55 to 60%, mildly dilated left atrium, mild to moderate circumferential pericardial effusion, Mild to moderate mitral valve regurgitation moderate mitral valve stenosis gradient 10 mmHg Dilated IVC  Seen by Dr. Wyn Quaker, episodes of dizziness High-grade right carotid artery stenosis estimated 80 to 99%  found to be in new onset Afib with RVR on 09/17/15 upon her arrival to [facility], pneumonia started on Eliquis given her stroke risk (CHADS2VASc of 4 - HTN, age x 2, female) and rate controlled.  Beta blockers were avoided given her asthma.  Started  on thousand channel blocker    PMH:   has a past medical history of Aortic atherosclerosis (HCC), Asthma, CAD (coronary artery disease), Carotid artery disease (HCC), CHF (congestive heart  failure) (HCC), Community acquired pneumonia, Essential hypertension, Glaucoma, Hyperlipidemia, Mitral regurgitation, Mitral stenosis, Peripheral neuropathy, Persistent atrial fibrillation (HCC) (09/17/2015), Squamous cell carcinoma of skin (12/18/2021), Squamous cell carcinoma of skin (12/31/2021), Squamous cell carcinoma of skin (12/31/2021), Squamous cell carcinoma of skin (06/24/2022), and Squamous cell carcinoma of skin (10/20/2022).  PSH:    Past Surgical History:  Procedure Laterality Date   ABDOMINAL HYSTERECTOMY  1987   due to heavy bleeding   APPENDECTOMY     CARDIAC CATHETERIZATION     CAROTID PTA/STENT INTERVENTION Right 08/15/2020   Procedure: CAROTID PTA/STENT INTERVENTION;  Surgeon: Annice Needy, MD;  Location: [facility] INVASIVE CV LAB;  Service: Cardiovascular;  Laterality: Right;   CATARACT EXTRACTION     CORONARY IMAGING/OCT N/A 08/02/2021   Procedure: INTRAVASCULAR IMAGING/OCT;  Surgeon: Yvonne Kendall, MD;  Location: [facility] CV LAB;  Service: Cardiovascular;  Laterality: N/A;   CORONARY STENT INTERVENTION N/A 08/02/2021   Procedure: CORONARY STENT INTERVENTION;  Surgeon: Yvonne Kendall, MD;  Location: [facility]ASIVE CV LAB;  Service: Cardiovascular;  Laterality: N/A;   ELECTROPHYSIOLOGIC STUDY N/A 11/15/2015   Procedure: CARDIOVERSION;  Surgeon: Antonieta Iba, MD;  Location: [facility] ORS;  Service: Cardiovascular;  Laterality: N/A;   LEFT HEART CATH AND CORONARY ANGIOGRAPHY N/A 08/02/2021   Procedure: LEFT HEART CATH AND CORONARY ANGIOGRAPHY;  Surgeon: Yvonne Kendall, MD;  Location: [facility];  Service: Cardiovascular;  Laterality: N/A;   LEFT HEART CATH AND CORONARY ANGIOGRAPHY N/A 08/07/2021   Procedure: LEFT HEART CATH AND CORONARY ANGIOGRAPHY;  Surgeon: Iran Ouch, MD;  Location: [facility] INVASIVE CV LAB;  Service: Cardiovascular;  Laterality: N/A;   RIGHT/LEFT HEART CATH AND CORONARY ANGIOGRAPHY N/A 05/17/2021   Procedure: RIGHT/LEFT HEART CATH AND  CORONARY ANGIOGRAPHY;  Surgeon: Yvonne Kendall, MD;  Location: [facility] INVASIVE CV LAB;  Service: Cardiovascular;  Laterality: N/A;   TEE WITHOUT CARDIOVERSION N/A 06/13/2021   Procedure: TRANSESOPHAGEAL ECHOCARDIOGRAM (TEE);  Surgeon: Antonieta Iba, MD;  Location: [facility] ORS;  Service: Cardiovascular;  Laterality: N/A;    Current Outpatient Medications  Medication Sig Dispense Refill   Calcium Carbonate-Vitamin D 600-400 MG-UNIT tablet Take 1 tablet by mouth daily.     cholecalciferol (VITAMIN D) 1000 UNITS tablet Take 1,000 Units by mouth daily.     clopidogrel (PLAVIX) 75 MG tablet TAKE 1 TABLET BY MOUTH DAILY 90 tablet 3   diltiazem (CARDIZEM CD) 120 MG 24 hr capsule Take 1 capsule (120 mg total) by mouth daily. 90 capsule 3   dorzolamide (TRUSOPT) 2 % ophthalmic solution Place 1 drop into both eyes 2 (two) times daily.      ezetimibe (ZETIA) 10 MG tablet TAKE 1 TABLET BY MOUTH DAILY 90 tablet 0   ipratropium-albuterol (DUONEB) 0.5-2.5 (3) MG/3ML SOLN TAKE BY NEBULIZATION EVERY 4 HOURS AS NEEDED 360 mL 1   isosorbide mononitrate (IMDUR) 30 MG 24 hr tablet TAKE 1 TABLET BY MOUTH DAILY 90 tablet 2   latanoprost (XALATAN) 0.005 % ophthalmic solution Place 1 drop into both eyes at bedtime.      levalbuterol (XOPENEX HFA) 45 MCG/ACT inhaler Inhale 2 puffs into the lungs every 6 (six) hours as needed for wheezing or shortness of breath. 1 each 3   magnesium oxide (MAG-OX) 400 MG tablet Take 400 mg by mouth daily.  Multiple Vitamins-Minerals (PRESERVISION AREDS 2+MULTI VIT) CAPS Take 2 capsules by mouth daily.     nitroGLYCERIN (NITROSTAT) 0.4 MG SL tablet Place 1 tablet (0.4 mg total) under the tongue every 5 (five) minutes as needed (for chest pain or shortness of breath). 100 tablet 3   potassium chloride (KLOR-CON) 10 MEQ tablet TAKE 2 TABLETS BY MOUTH DAILY. TAKE EXTRA 2 TABLETS WHEN TAKING EXTRA LASIX. 200 tablet 0   pregabalin (LYRICA) 50 MG capsule TAKE 1 CAPSULE BY MOUTH 2 TIMES  DAILY 60 capsule 5   rosuvastatin (CRESTOR) 20 MG tablet TAKE 1 TABLET BY MOUTH DAILY. 90 tablet 0   Tiotropium Bromide-Olodaterol (STIOLTO RESPIMAT) 2.5-2.5 MCG/ACT AERS Inhale 2 puffs into the lungs daily. 1 each 5   torsemide (DEMADEX) 20 MG tablet TAKE 2 TABLETS BY MOUTH TWICE DAILY *DISCONTINUE FUROSEMIDE* 120 tablet 5   vitamin C (ASCORBIC ACID) 500 MG tablet Take 500 mg by mouth daily.     vitamin E 100 UNIT capsule Take 200 Units by mouth daily.     warfarin (COUMADIN) 2.5 MG tablet Take 1 1/2 tablets daily except take 1 tablet on Mondays, Wednesdays and Fridays or as directed by coumadin clinic. 40 tablet 5   No current facility-administered medications for this visit.    Allergies:   Shellfish allergy, Azithromycin, Tamiflu [oseltamivir phosphate], and Albuterol   Social History:  The patient  reports that she has never smoked. She has never used smokeless tobacco. She reports that she does not drink alcohol and does not use drugs.   Family History:   family history includes CAD in her father and mother; Cancer (age of onset: 96) in her son.    Review of Systems: Review of Systems  Constitutional: Negative.   Respiratory: Negative.    Cardiovascular: Negative.   Gastrointestinal: Negative.   Musculoskeletal: Negative.   Neurological: Negative.   Psychiatric/Behavioral: Negative.    All other systems reviewed and are negative.  PHYSICAL EXAM: VS:  BP (!) 110/48 (BP Location: Left Arm, Patient Position: Sitting)   Pulse 63   Ht 5\' 5"  (1.651 m)   Wt 126 lb 9.6 oz (57.4 kg)   SpO2 98%   BMI 21.07 kg/m  , BMI Body mass index is 21.07 kg/m. Constitutional:  oriented to person, place, and time. No distress.  HENT:  Head: Grossly normal Eyes:  no discharge. No scleral icterus.  Neck: No JVD, no carotid bruits  Cardiovascular: Irregularly irregular, no murmurs appreciated Pulmonary/Chest: Clear to auscultation bilaterally, no wheezes or rails Abdominal: Soft.  no  distension.  no tenderness.  Musculoskeletal: Normal range of motion Neurological:  normal muscle tone. Coordination normal. No atrophy Skin: Skin warm and dry Psychiatric: normal affect, pleasant   Recent Labs: 10/30/2022: B Natriuretic Peptide 694.8 11/01/2022: Magnesium 2.6 11/07/2022: ALT 46; Hemoglobin 13.3; Platelets 311; TSH 3.430 12/12/2022: BUN 19; Creatinine, Ser 1.13; Potassium 4.2; Sodium 145    Lipid Panel Lab Results  Component Value Date   CHOL 100 08/07/2021   HDL 44 08/07/2021   LDLCALC 47 08/07/2021   TRIG 46 08/07/2021      Wt Readings from Last 3 Encounters:  01/07/23 126 lb 9.6 oz (57.4 kg)  12/23/22 126 lb 3.2 oz (57.2 kg)  12/12/22 126 lb (57.2 kg)     ASSESSMENT AND PLAN:  Paroxysmal atrial fibrillation (HCC) -  Atrial fibrillation noted on EKG November 2022, she is on warfarin,  not interested in cardioversions, asymptomatic Given low blood pressure we recommend she decrease  diltiazem ER down to 120 daily Off amiodarone  Shortness of breath/chronic diastolic CHF Chronic symptoms on torsemide 40 twice daily Moderating her fluid intake  PAD/carotid stenosis Prior stenting, cholesterol at goal On Plavix, followed by vascular  Coronary artery disease with stable angina Prior stenting to mid LAD end of 2022 Currently with no symptoms of angina. No further workup at this time. Continue current medication regimen.cholesterol at goal  Essential hypertension Blood pressure running low recommend we decrease diltiazem ER down to 120 daily If continued orthostasis symptoms could cut the isosorbide in half down to 15 mg daily  Chronic dizziness Previously seen by vestibular PT symptoms  wax and wane  Mitral valve stenosis Moderate mitral valve stenosis by TEE Also with mitral valve regurgitation periodic echocardiogram on annual basis  Centrilobular emphysema (HCC) Breathing stable on diuretic  Hyperlipidemia Cholesterol at goal no changes  made    Total encounter time more than 30 minutes  Greater than 50% was spent in counseling and coordination of care with the patient     Orders Placed This Encounter  Procedures   EKG 12-Lead      Signed, Dossie Arbour, M.D., Ph.D. 01/07/2023  [facility] [place], [place] [phone]

## 2023-01-07 ENCOUNTER — Ambulatory Visit: Payer: Medicare Other | Attending: Cardiovascular Disease | Admitting: Cardiovascular Disease

## 2023-01-07 ENCOUNTER — Encounter: Payer: Self-pay | Admitting: Cardiovascular Disease

## 2023-01-07 VITALS — BP 110/48 | HR 63 | Ht 65.0 in | Wt 126.6 lb

## 2023-01-07 DIAGNOSIS — I5032 Chronic diastolic (congestive) heart failure: Secondary | ICD-10-CM

## 2023-01-07 DIAGNOSIS — I050 Rheumatic mitral stenosis: Secondary | ICD-10-CM | POA: Diagnosis not present

## 2023-01-07 DIAGNOSIS — I480 Paroxysmal atrial fibrillation: Secondary | ICD-10-CM | POA: Diagnosis not present

## 2023-01-07 DIAGNOSIS — I25118 Atherosclerotic heart disease of native coronary artery with other forms of angina pectoris: Secondary | ICD-10-CM | POA: Diagnosis not present

## 2023-01-07 DIAGNOSIS — I10 Essential (primary) hypertension: Secondary | ICD-10-CM | POA: Diagnosis not present

## 2023-01-07 DIAGNOSIS — I272 Pulmonary hypertension, unspecified: Secondary | ICD-10-CM

## 2023-01-07 DIAGNOSIS — J449 Chronic obstructive pulmonary disease, unspecified: Secondary | ICD-10-CM

## 2023-01-07 DIAGNOSIS — I6523 Occlusion and stenosis of bilateral carotid arteries: Secondary | ICD-10-CM

## 2023-01-07 MED ORDER — DILTIAZEM HCL ER COATED BEADS 120 MG PO CP24
120.0000 mg | ORAL_CAPSULE | Freq: Every day | ORAL | 3 refills | Status: DC
Start: 2023-01-07 — End: 2024-01-04

## 2023-01-07 NOTE — Patient Instructions (Signed)
Medication Instructions:  Please decrease the diltiazem er to 120 mg daily For dizziness, call the office   If you need a refill on your cardiac medications before your next appointment, please call your pharmacy.   Lab work: No new labs needed  Testing/Procedures: No new testing needed  Follow-Up: At [facility], you and your health needs are our priority.  As part of our continuing mission to provide you with exceptional heart care, we have created designated Provider Care Teams.  These Care Teams include your primary Cardiologist (physician) and Advanced Practice Providers (APPs -  Physician Assistants and Nurse Practitioners) who all work together to provide you with the care you need, when you need it.  You will need a follow up appointment in 12 months  Providers on your designated Care Team:   Nicolasa Ducking, NP Eula Listen, PA-C Cadence Fransico Michael, [place]  COVID-19 Vaccine Information can be found at: [email or web address] For questions related to vaccine distribution or appointments, please email [email or web address] or call [phone].

## 2023-01-21 ENCOUNTER — Ambulatory Visit: Payer: Medicare Other | Attending: Cardiovascular Disease

## 2023-01-21 DIAGNOSIS — Z5181 Encounter for therapeutic drug level monitoring: Secondary | ICD-10-CM | POA: Diagnosis not present

## 2023-01-21 DIAGNOSIS — I050 Rheumatic mitral stenosis: Secondary | ICD-10-CM

## 2023-01-21 DIAGNOSIS — I480 Paroxysmal atrial fibrillation: Secondary | ICD-10-CM

## 2023-01-21 LAB — POCT INR: INR: 1.8 — AB (ref 2.0–3.0)

## 2023-01-21 NOTE — Patient Instructions (Signed)
Increase warfarin to 1.5 tablets every day, EXCEPT 1 TABLET ON MONDAY AND FRIDAY.   Recheck INR in 4 weeks

## 2023-01-23 ENCOUNTER — Telehealth: Payer: Self-pay

## 2023-01-23 DIAGNOSIS — J209 Acute bronchitis, unspecified: Secondary | ICD-10-CM

## 2023-01-23 DIAGNOSIS — I5032 Chronic diastolic (congestive) heart failure: Secondary | ICD-10-CM

## 2023-01-23 NOTE — Telephone Encounter (Signed)
PharmD reviewed patient chart to assess eligibility for [facility] Care Management and Coordination services. Patient was determined to be a good candidate for the program given the complexity of the medication regimen and/or overall risk for hospitalization and increased utilization.  Referral entered in order to outreach patient and offer appointment with PharmD. Referral cosigned to PCP.  

## 2023-01-24 ENCOUNTER — Other Ambulatory Visit: Payer: Self-pay | Admitting: Cardiovascular Disease

## 2023-01-24 DIAGNOSIS — I480 Paroxysmal atrial fibrillation: Secondary | ICD-10-CM

## 2023-01-26 NOTE — Telephone Encounter (Signed)
Refill request

## 2023-01-26 NOTE — Telephone Encounter (Signed)
Warfarin 2.5mg  refill Afib Last INR 01/21/23 Last OV 01/07/23

## 2023-02-12 ENCOUNTER — Other Ambulatory Visit: Payer: Self-pay | Admitting: Cardiovascular Disease

## 2023-02-18 ENCOUNTER — Ambulatory Visit: Payer: Medicare Other | Attending: Cardiovascular Disease

## 2023-02-18 DIAGNOSIS — I050 Rheumatic mitral stenosis: Secondary | ICD-10-CM | POA: Diagnosis not present

## 2023-02-18 DIAGNOSIS — I480 Paroxysmal atrial fibrillation: Secondary | ICD-10-CM

## 2023-02-18 DIAGNOSIS — Z5181 Encounter for therapeutic drug level monitoring: Secondary | ICD-10-CM | POA: Diagnosis not present

## 2023-02-18 LAB — POCT INR: INR: 2.7 (ref 2.0–3.0)

## 2023-02-18 NOTE — Patient Instructions (Signed)
Continue 1.5 tablets every day, EXCEPT 1 TABLET ON MONDAY AND FRIDAY.   Recheck INR in 6 weeks

## 2023-02-21 ENCOUNTER — Other Ambulatory Visit: Payer: Self-pay | Admitting: Cardiovascular Disease

## 2023-03-23 ENCOUNTER — Other Ambulatory Visit: Payer: Self-pay | Admitting: Cardiovascular Disease

## 2023-04-01 ENCOUNTER — Ambulatory Visit: Payer: Medicare Other

## 2023-04-01 DIAGNOSIS — Z5181 Encounter for therapeutic drug level monitoring: Secondary | ICD-10-CM

## 2023-04-01 DIAGNOSIS — I480 Paroxysmal atrial fibrillation: Secondary | ICD-10-CM

## 2023-04-01 DIAGNOSIS — I050 Rheumatic mitral stenosis: Secondary | ICD-10-CM

## 2023-04-01 LAB — POCT INR: INR: 3.1 — AB (ref 2.0–3.0)

## 2023-04-01 NOTE — Patient Instructions (Signed)
Continue 1.5 tablets every day, EXCEPT 1 TABLET ON MONDAY AND FRIDAY.   Recheck INR in 6 weeks;  Eat greens tonight;

## 2023-04-13 ENCOUNTER — Other Ambulatory Visit: Payer: Self-pay | Admitting: Family Medicine

## 2023-04-13 ENCOUNTER — Other Ambulatory Visit: Payer: Self-pay | Admitting: Cardiovascular Disease

## 2023-05-13 ENCOUNTER — Ambulatory Visit: Payer: Medicare Other | Attending: Cardiovascular Disease

## 2023-05-13 ENCOUNTER — Ambulatory Visit: Payer: Medicare Other | Admitting: Dermatology

## 2023-05-13 DIAGNOSIS — I050 Rheumatic mitral stenosis: Secondary | ICD-10-CM | POA: Diagnosis not present

## 2023-05-13 DIAGNOSIS — I480 Paroxysmal atrial fibrillation: Secondary | ICD-10-CM

## 2023-05-13 DIAGNOSIS — Z5181 Encounter for therapeutic drug level monitoring: Secondary | ICD-10-CM | POA: Diagnosis not present

## 2023-05-13 LAB — POCT INR: INR: 2.5 (ref 2.0–3.0)

## 2023-05-13 NOTE — Patient Instructions (Signed)
Continue 1.5 tablets every day, EXCEPT 1 TABLET ON MONDAY AND FRIDAY.   Recheck INR in 6 weeks

## 2023-05-19 ENCOUNTER — Ambulatory Visit (INDEPENDENT_AMBULATORY_CARE_PROVIDER_SITE_OTHER): Payer: Medicare Other | Admitting: Family Medicine

## 2023-05-19 DIAGNOSIS — Z23 Encounter for immunization: Secondary | ICD-10-CM | POA: Diagnosis not present

## 2023-05-19 NOTE — Progress Notes (Signed)
Vaccine administration only. No E&M service today.   

## 2023-05-26 ENCOUNTER — Other Ambulatory Visit: Payer: Self-pay | Admitting: Cardiovascular Disease

## 2023-05-26 DIAGNOSIS — I480 Paroxysmal atrial fibrillation: Secondary | ICD-10-CM

## 2023-05-27 NOTE — Telephone Encounter (Signed)
Warfarin 2.5mg  refill Afib Last INR 05/13/23 Last OV 01/07/23

## 2023-05-27 NOTE — Telephone Encounter (Signed)
Refill request

## 2023-05-29 DIAGNOSIS — Z743 Need for continuous supervision: Secondary | ICD-10-CM | POA: Diagnosis not present

## 2023-05-29 DIAGNOSIS — R58 Hemorrhage, not elsewhere classified: Secondary | ICD-10-CM | POA: Diagnosis not present

## 2023-05-30 ENCOUNTER — Other Ambulatory Visit: Payer: Self-pay

## 2023-05-30 ENCOUNTER — Emergency Department
Admission: EM | Admit: 2023-05-30 | Discharge: 2023-05-30 | Disposition: A | Discharge status: Home / Self Care | Payer: Medicare Other | Attending: Emergency Medicine | Admitting: Emergency Medicine

## 2023-05-30 ENCOUNTER — Telehealth: Payer: Self-pay | Admitting: Physician Assistant

## 2023-05-30 DIAGNOSIS — K1379 Other lesions of oral mucosa: Secondary | ICD-10-CM

## 2023-05-30 DIAGNOSIS — Z7901 Long term (current) use of anticoagulants: Secondary | ICD-10-CM | POA: Diagnosis not present

## 2023-05-30 DIAGNOSIS — I2510 Atherosclerotic heart disease of native coronary artery without angina pectoris: Secondary | ICD-10-CM | POA: Diagnosis not present

## 2023-05-30 DIAGNOSIS — K068 Other specified disorders of gingiva and edentulous alveolar ridge: Secondary | ICD-10-CM | POA: Diagnosis not present

## 2023-05-30 DIAGNOSIS — I4891 Unspecified atrial fibrillation: Secondary | ICD-10-CM | POA: Diagnosis not present

## 2023-05-30 LAB — COMPREHENSIVE METABOLIC PANEL
ALT: 21 U/L (ref 0–44)
AST: 23 U/L (ref 15–41)
Albumin: 4.3 g/dL (ref 3.5–5.0)
Alkaline Phosphatase: 101 U/L (ref 38–126)
Anion gap: 13 (ref 5–15)
BUN: 30 mg/dL — ABNORMAL HIGH (ref 8–23)
CO2: 26 mmol/L (ref 22–32)
Calcium: 8.9 mg/dL (ref 8.9–10.3)
Chloride: 103 mmol/L (ref 98–111)
Creatinine, Ser: 1.08 mg/dL — ABNORMAL HIGH (ref 0.44–1.00)
GFR, Estimated: 49 mL/min — ABNORMAL LOW (ref >60)
Glucose, Bld: 141 mg/dL — ABNORMAL HIGH (ref 70–99)
Potassium: 4 mmol/L (ref 3.5–5.1)
Sodium: 142 mmol/L (ref 135–145)
Total Bilirubin: 0.7 mg/dL (ref 0.3–1.2)
Total Protein: 7.3 g/dL (ref 6.5–8.1)

## 2023-05-30 LAB — CBC
HCT: 44.4 % (ref 36.0–46.0)
Hemoglobin: 13.8 g/dL (ref 12.0–15.0)
MCH: 25 pg — ABNORMAL LOW (ref 26.0–34.0)
MCHC: 31.1 g/dL (ref 30.0–36.0)
MCV: 80.6 fL (ref 80.0–100.0)
Platelets: 164 10*3/uL (ref 150–400)
RBC: 5.51 MIL/uL — ABNORMAL HIGH (ref 3.87–5.11)
RDW: 16.7 % — ABNORMAL HIGH (ref 11.5–15.5)
WBC: 9.2 10*3/uL (ref 4.0–10.5)
nRBC: 0 % (ref 0.0–0.2)

## 2023-05-30 LAB — PROTIME-INR
INR: 3.3 — ABNORMAL HIGH (ref 0.8–1.2)
Prothrombin Time: 33.8 s — ABNORMAL HIGH (ref 11.4–15.2)

## 2023-05-30 MED ORDER — TRANEXAMIC ACID FOR EPISTAXIS
500.0000 mg | Freq: Once | TOPICAL | Status: AC
  Administered 2023-05-30: 500 mg via TOPICAL
  Filled 2023-05-30 (×2): qty 10

## 2023-05-30 NOTE — ED Provider Notes (Addendum)
[facility] Provider Note    Event Date/Time   First MD Initiated Contact with Patient 05/30/23 [phone]     (approximate)   History   Coagulation Disorder   HPI  Molly Hodges is a 89 y.o. female   Past medical history of CAD and atrial fibrillation on Plavix and Coumadin who presents to the Emergency Department with gum bleeding after dental cleaning earlier today.  Dental cleaning around noon time had oozing around the gums mostly on the right upper and molar lower sides, continuing oozing throughout the day prompted emergency department visit.  Denies shortness of breath dizziness lightheadedness and has been compliant with her Coumadin.   External Medical Documents Reviewed: Cardiology visit dated May 2024 documented past medical history and anticoagulation for atrial fibrillation      Physical Exam   Triage Vital Signs: ED Triage Vitals [05/30/23 0034]  Encounter Vitals Group     BP (!) 128/53     Systolic BP Percentile      Diastolic BP Percentile      Pulse Rate 72     Resp 16     Temp (!) 96.4 F (35.8 C)     Temp Source Axillary     SpO2 97 %     Weight 120 lb (54.4 kg)     Height 5\' 5"  (1.651 m)     Head Circumference      Peak Flow      Pain Score 0     Pain Loc      Pain Education      Exclude from Growth Chart     Most recent vital signs: Vitals:   05/30/23 0034 05/30/23 0217  BP: (!) 128/53 (!) 114/55  Pulse: 72 73  Resp: 16 20  Temp: (!) 96.4 F (35.8 C)   SpO2: 97% 97%    General: Awake, no distress.  CV:  Good peripheral perfusion.  Resp:  Normal effort.  Abd:  No distention.  Other:  Comfortable appearing woman in no acute distress with normal vital signs, with gauze in place applying pressure, when I remove the gauze, I note that there is some blood clot around the lower molars on the right side without any active bleeding and slow oozing around the top right molar.  Airway intact comfortable  appearing.   ED Results / Procedures / Treatments   Labs (all labs ordered are listed, but only abnormal results are displayed) Labs Reviewed  CBC - Abnormal; Notable for the following components:      Result Value   RBC 5.51 (*)    MCH 25.0 (*)    RDW 16.7 (*)    All other components within normal limits  COMPREHENSIVE METABOLIC PANEL - Abnormal; Notable for the following components:   Glucose, Bld 141 (*)    BUN 30 (*)    Creatinine, Ser 1.08 (*)    GFR, Estimated 49 (*)    All other components within normal limits  PROTIME-INR - Abnormal; Notable for the following components:   Prothrombin Time 33.8 (*)    INR 3.3 (*)    All other components within normal limits     I ordered and reviewed the above labs they are notable for INR is 3.3 and her H&H is at baseline, normal  PROCEDURES:  Critical Care performed: No  Procedures   MEDICATIONS ORDERED IN ED: Medications  tranexamic acid (CYKLOKAPRON) 1000 MG/10ML topical solution 500 mg (500 mg Topical Given 05/30/23  6045)     IMPRESSION / MDM / ASSESSMENT AND PLAN / ED COURSE  I reviewed the triage vital signs and the nursing notes.                                Patient's presentation is most consistent with acute presentation with potential threat to life or bodily function.  Differential diagnosis includes, but is not limited to, acute blood loss anemia, dental bleeding   The patient is on the cardiac monitor to evaluate for evidence of arrhythmia and/or significant heart rate changes.  MDM: Oozing blood already starting to hemostasis after TXA and gauze pressure applied.  Still some oozing to the top right molar only.  INR is 3.3.  Advised to hold Coumadin for tomorrow and call Coumadin clinic regarding dental bleeding, lab testing and adjustment of doses as needed.  No indication for immediate reversal given no life-threatening bleed and no significant elevation of her INR.  Reassess for hemostasis and  discharge if achieved.  Bleeding slowed significantly, blood clot forming in the upper molar, plan be for discharge show return with any new or worsening bleeding or symptoms of blood loss.       FINAL CLINICAL IMPRESSION(S) / ED DIAGNOSES   Final diagnoses:  Oral bleeding     Rx / DC Orders   ED Discharge Orders     None        Note:  This document was prepared using Dragon voice recognition software and may include unintentional dictation errors.    Pilar Jarvis, MD 05/30/23 Marquis Lunch    Pilar Jarvis, MD 05/30/23 [phone]

## 2023-05-30 NOTE — Discharge Instructions (Addendum)
For this weekend if you continue to have oozing, apply gauze and bite down hold pressure, if there is ongoing bleeding that you cannot control, or if you develop symptoms of blood loss like shortness of breath, lightheadedness or any other new, worsening or expected symptoms you should come back to the emergency department for reevaluation.  You can remove the gauze to eat but focus on soft foods and eat on the other side of the mouth.  Your INR level tonight was 3.3.  Call your Coumadin clinic in the morning for their advice on dosing of Coumadin before taking your next dose.     Thank you for choosing [place] for your health care today!  Please see your primary doctor this week for a follow up appointment.   If you have any new, worsening, or unexpected symptoms call your doctor right away or come back to the emergency department for reevaluation.  It was my pleasure to care for you today.   Daneil Dan Modesto Charon, MD

## 2023-05-30 NOTE — ED Notes (Signed)
Bleeding controlled. Pt given guaze and instructed on how to use if bleeding starts up again.

## 2023-05-30 NOTE — Telephone Encounter (Signed)
Pt called stating she had a dental cleaning Friday and later that night after taking coumadin had bleeding gums prompting ER evaluation. INR was 3.3. Pressure held and she was discharged with instructions to contact coumadin clinic. She called this morning (discharged about 3 hrs ago) stating her gums were bleeding again. She has thusfar not skipped any doses of coumadin. I recommended holding tonight's dose of coumadin and resume on Sunday. I think she should also have INR checked Tues/Wed given elevated INR, although this was a peripheral blood draw and not a finger stick.

## 2023-05-30 NOTE — ED Triage Notes (Signed)
Pt presents to ER from home via EMS. Pt reports she had a dental cleaning today and she started bleeding around 3pm Friday afternoon. Pt reports she takes warfarin. Pt actively bleeding from right upper gum region. Pt talks in complete sentences no respiratory distress noted

## 2023-06-01 NOTE — Telephone Encounter (Signed)
I spoke with patient and scheduled her for INR check 10/16 in [place] office.  She verbalized understanding

## 2023-06-03 ENCOUNTER — Ambulatory Visit: Payer: Medicare Other | Attending: Cardiovascular Disease

## 2023-06-03 DIAGNOSIS — I480 Paroxysmal atrial fibrillation: Secondary | ICD-10-CM

## 2023-06-03 DIAGNOSIS — Z5181 Encounter for therapeutic drug level monitoring: Secondary | ICD-10-CM

## 2023-06-03 DIAGNOSIS — I050 Rheumatic mitral stenosis: Secondary | ICD-10-CM | POA: Diagnosis not present

## 2023-06-03 LAB — POCT INR: INR: 2.1 (ref 2.0–3.0)

## 2023-06-03 NOTE — Patient Instructions (Signed)
Continue 1.5 tablets every day, EXCEPT 1 TABLET ON MONDAY AND FRIDAY.   Recheck INR in 6 weeks

## 2023-06-11 DIAGNOSIS — H401131 Primary open-angle glaucoma, bilateral, mild stage: Secondary | ICD-10-CM | POA: Diagnosis not present

## 2023-06-11 DIAGNOSIS — H353132 Nonexudative age-related macular degeneration, bilateral, intermediate dry stage: Secondary | ICD-10-CM | POA: Diagnosis not present

## 2023-06-11 DIAGNOSIS — Z961 Presence of intraocular lens: Secondary | ICD-10-CM | POA: Diagnosis not present

## 2023-06-11 DIAGNOSIS — H353123 Nonexudative age-related macular degeneration, left eye, advanced atrophic without subfoveal involvement: Secondary | ICD-10-CM | POA: Diagnosis not present

## 2023-06-12 ENCOUNTER — Other Ambulatory Visit: Payer: Self-pay | Admitting: Cardiovascular Disease

## 2023-06-12 DIAGNOSIS — E785 Hyperlipidemia, unspecified: Secondary | ICD-10-CM

## 2023-06-12 NOTE — Telephone Encounter (Signed)
last visit on 01/07/23 with plan to f/u in 12 months.  next visit none/active recall

## 2023-06-20 ENCOUNTER — Other Ambulatory Visit: Payer: Self-pay | Admitting: Cardiovascular Disease

## 2023-06-30 ENCOUNTER — Telehealth: Payer: Self-pay

## 2023-06-30 NOTE — Telephone Encounter (Signed)
Transition Care Management Follow-up Telephone Call Date of discharge and from where: 05/30/2023 [facility] How have you been since you were released from the hospital? Patient is feeling better, back to normal. Any questions or concerns? No  Items Reviewed: Did the pt receive and understand the discharge instructions provided? Yes  Medications obtained and verified?  No medication prescribed. Other? No  Any new allergies since your discharge? No  Dietary orders reviewed? Yes Do you have support at home? Yes   Follow up appointments reviewed:  PCP Hospital f/u appt confirmed? No  Scheduled to see  on  @ . Specialist Hospital f/u appt confirmed? Yes  Scheduled to see Sigurd Sos, RN on 06/03/2023 @ [facility]. Are transportation arrangements needed? No  If their condition worsens, is the pt aware to call PCP or go to the Emergency Dept.? Yes Was the patient provided with contact information for the PCP's office or ED? Yes Was to pt encouraged to call back with questions or concerns? Yes   Sunni Richardson [facility]  [facility] [place] [facility] Guide Direct Dial: [phone]  Website: [email or web address]

## 2023-07-02 ENCOUNTER — Ambulatory Visit: Payer: Medicare Other | Admitting: Dermatology

## 2023-07-02 DIAGNOSIS — W908XXA Exposure to other nonionizing radiation, initial encounter: Secondary | ICD-10-CM

## 2023-07-02 DIAGNOSIS — L821 Other seborrheic keratosis: Secondary | ICD-10-CM

## 2023-07-02 DIAGNOSIS — Z872 Personal history of diseases of the skin and subcutaneous tissue: Secondary | ICD-10-CM

## 2023-07-02 DIAGNOSIS — L814 Other melanin hyperpigmentation: Secondary | ICD-10-CM | POA: Diagnosis not present

## 2023-07-02 DIAGNOSIS — L578 Other skin changes due to chronic exposure to nonionizing radiation: Secondary | ICD-10-CM | POA: Diagnosis not present

## 2023-07-02 DIAGNOSIS — Z85828 Personal history of other malignant neoplasm of skin: Secondary | ICD-10-CM | POA: Diagnosis not present

## 2023-07-02 DIAGNOSIS — D229 Melanocytic nevi, unspecified: Secondary | ICD-10-CM

## 2023-07-02 DIAGNOSIS — L820 Inflamed seborrheic keratosis: Secondary | ICD-10-CM | POA: Diagnosis not present

## 2023-07-02 DIAGNOSIS — Z1283 Encounter for screening for malignant neoplasm of skin: Secondary | ICD-10-CM | POA: Diagnosis not present

## 2023-07-02 DIAGNOSIS — D1801 Hemangioma of skin and subcutaneous tissue: Secondary | ICD-10-CM | POA: Diagnosis not present

## 2023-07-02 DIAGNOSIS — L570 Actinic keratosis: Secondary | ICD-10-CM | POA: Diagnosis not present

## 2023-07-02 DIAGNOSIS — Z8589 Personal history of malignant neoplasm of other organs and systems: Secondary | ICD-10-CM

## 2023-07-02 NOTE — Progress Notes (Signed)
Follow-Up Visit   Subjective  Molly Hodges or Molly Hodges is a 89 y.o. female who presents for the following: Skin Cancer Screening and Full Body Skin Exam Hx of aks, hx of scc,  Hx of spot left hand  Hx of spot right hand  Rough spots at back  Right temple area rough area   The patient presents for Total-Body Skin Exam (TBSE) for skin cancer screening and mole check. The patient has spots, moles and lesions to be evaluated, some may be new or changing and the patient may have concern these could be cancer.    The following portions of the chart were reviewed this encounter and updated as appropriate: medications, allergies, medical history  Review of Systems:  No other skin or systemic complaints except as noted in HPI or Assessment and Plan.  Objective  Well appearing patient in no apparent distress; mood and affect are within normal limits.  A full examination was performed including scalp, head, eyes, ears, nose, lips, neck, chest, axillae, abdomen, back, buttocks, bilateral upper extremities, bilateral lower extremities, hands, feet, fingers, toes, fingernails, and toenails. All findings within normal limits unless otherwise noted below.   Relevant physical exam findings are noted in the Assessment and Plan.  left hand x 1 Erythematous stuck-on, waxy papule or plaque  right zygoma x 1 Erythematous thin papules/macules with gritty scale.     Assessment & Plan   SKIN CANCER SCREENING PERFORMED TODAY.  ACTINIC DAMAGE - Chronic condition, secondary to cumulative UV/sun exposure - diffuse scaly erythematous macules with underlying dyspigmentation - Recommend daily broad spectrum sunscreen SPF 30+ to sun-exposed areas, reapply every 2 hours as needed.  - Staying in the shade or wearing long sleeves, sun glasses (UVA+UVB protection) and wide brim hats (4-inch brim around the entire circumference of the hat) are also recommended for sun protection.  - Call for new or changing  lesions.  LENTIGINES, SEBORRHEIC KERATOSES, HEMANGIOMAS - Benign normal skin lesions - Benign-appearing - Call for any changes  MELANOCYTIC NEVI - Tan-brown and/or pink-flesh-colored symmetric macules and papules - Benign appearing on exam today - Observation - Call clinic for new or changing moles - Recommend daily use of broad spectrum spf 30+ sunscreen to sun-exposed areas.   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  - Call if any new or changing lesions are noted between office visits   Inflamed seborrheic keratosis left hand x 1  Symptomatic, irritating, patient would like treated.  Destruction of lesion - left hand x 1 Complexity: simple   Destruction method: cryotherapy   Informed consent: discussed and consent obtained   Timeout:  patient name, date of birth, surgical site, and procedure verified Lesion destroyed using liquid nitrogen: Yes   Region frozen until ice ball extended beyond lesion: Yes   Outcome: patient tolerated procedure well with no complications   Post-procedure details: wound care instructions given    Actinic keratosis right zygoma x 1  Actinic keratoses are precancerous spots that appear secondary to cumulative UV radiation exposure/sun exposure over time. They are chronic with expected duration over 1 year. A portion of actinic keratoses will progress to squamous cell carcinoma of the skin. It is not possible to reliably predict which spots will progress to skin cancer and so  treatment is recommended to prevent development of skin cancer.  Recommend daily broad spectrum sunscreen SPF 30+ to sun-exposed areas, reapply every 2 hours as needed.  Recommend staying in the shade or wearing long sleeves, sun glasses (UVA+UVB  protection) and wide brim hats (4-inch brim around the entire circumference of the hat). Call for new or changing lesions.  Destruction of lesion - right zygoma x 1 Complexity: simple   Destruction method: cryotherapy   Informed consent: discussed and consent obtained   Timeout:  patient name, date of birth, surgical site, and procedure verified Lesion destroyed using liquid nitrogen: Yes   Region frozen until ice ball extended beyond lesion: Yes   Outcome: patient tolerated procedure well with no complications   Post-procedure details: wound care instructions given     Return in about 1 year (around 07/01/2024) for TBSE.  IAsher Muir, CMA, am acting as scribe for Armida Sans, MD.   Documentation: I have reviewed the above documentation for accuracy and completeness, and I agree with the above.  Armida Sans, MD

## 2023-07-02 NOTE — Patient Instructions (Signed)
 Melanoma ABCDEs  Melanoma is the most dangerous type of skin cancer, and is the leading cause of death from skin disease.  You are more likely to develop melanoma if you: Have light-colored skin, light-colored eyes, or red or blond hair Spend a lot of time in the sun Tan regularly, either outdoors or in a tanning bed Have had blistering sunburns, especially during childhood Have a close family member who has had a melanoma Have atypical moles or large birthmarks  Early detection of melanoma is key since treatment is typically straightforward and cure rates are extremely high if we catch it early.   The first sign of melanoma is often a change in a mole or a new dark spot.  The ABCDE system is a way of remembering the signs of melanoma.  A for asymmetry:  The two halves do not match. B for border:  The edges of the growth are irregular. C for color:  A mixture of colors are present instead of an even brown color. D for diameter:  Melanomas are usually (but not always) greater than 6mm - the size of a pencil eraser. E for evolution:  The spot keeps changing in size, shape, and color.  Please check your skin once per month between visits. You can use a small mirror in front and a large mirror behind you to keep an eye on the back side or your body.   If you see any new or changing lesions before your next follow-up, please call to schedule a visit.  Please continue daily skin protection including broad spectrum sunscreen SPF 30+ to sun-exposed areas, reapplying every 2 hours as needed when you're outdoors.   Staying in the shade or wearing long sleeves, sun glasses (UVA+UVB protection) and wide brim hats (4-inch brim around the entire circumference of the hat) are also recommended for sun protection.    Due to recent changes in healthcare laws, you may see results of your pathology and/or laboratory studies on MyChart before the doctors have had a chance to review them. We understand that in  some cases there may be results that are confusing or concerning to you. Please understand that not all results are received at the same time and often the doctors may need to interpret multiple results in order to provide you with the best plan of care or course of treatment. Therefore, we ask that you please give [place] 2 business days to thoroughly review all your results before contacting the office for clarification. Should we see a critical lab result, you will be contacted sooner.   If You Need Anything After Your Visit  If you have any questions or concerns for your doctor, please call our main line at [phone] and press option 4 to reach your doctor's medical assistant. If no one answers, please leave a voicemail as directed and we will return your call as soon as possible. Messages left after 4 pm will be answered the following business day.   You may also send [place] a message via MyChart. We typically respond to MyChart messages within 1-2 business days.  For prescription refills, please ask your pharmacy to contact our office. Our fax number is [phone].  If you have an urgent issue when the clinic is closed that cannot wait until the next business day, you can page your doctor at the number below.    Please note that while we do our best to be available for urgent issues outside of office hours, we  are not available 24/7.   If you have an urgent issue and are unable to reach [place], you may choose to seek medical care at your doctor's office, retail clinic, urgent care center, or emergency room.  If you have a medical emergency, please immediately call 911 or go to the emergency department.  Pager Numbers  - Dr. Gwen Pounds: [phone]  - Dr. Roseanne Reno: [phone]  - Dr. Katrinka Blazing: [phone]   In the event of inclement weather, please call our main line at [phone] for an update on the status of any delays or closures.  Dermatology Medication Tips: Please keep the boxes that  topical medications come in in order to help keep track of the instructions about where and how to use these. Pharmacies typically print the medication instructions only on the boxes and not directly on the medication tubes.   If your medication is too expensive, please contact our office at [phone] option 4 or send [place] a message through MyChart.   We are unable to tell what your co-pay for medications will be in advance as this is different depending on your insurance coverage. However, we may be able to find a substitute medication at lower cost or fill out paperwork to get insurance to cover a needed medication.   If a prior authorization is required to get your medication covered by your insurance company, please allow [place] 1-2 business days to complete this process.  Drug prices often vary depending on where the prescription is filled and some pharmacies may offer cheaper prices.  The website www.goodrx.com contains coupons for medications through different pharmacies. The prices here do not account for what the cost may be with help from insurance (it may be cheaper with your insurance), but the website can give you the price if you did not use any insurance.  - You can print the associated coupon and take it with your prescription to the pharmacy.  - You may also stop by our office during regular business hours and pick up a GoodRx coupon card.  - If you need your prescription sent electronically to a different pharmacy, notify our office through [facility] or by phone at [phone] option 4.     Si Usted Necesita Algo Despus de Su Visita  Tambin puede enviarnos un mensaje a travs de Clinical cytogeneticist. Por lo general respondemos a los mensajes de MyChart en el transcurso de 1 a 2 das hbiles.  Para renovar recetas, por favor pida a su farmacia que se ponga en contacto con nuestra oficina. Annie Sable de fax es [place] [phone].  Si tiene un asunto urgente cuando la clnica  est cerrada y que no puede esperar hasta el siguiente da hbil, puede llamar/localizar a su doctor(a) al nmero que aparece a continuacin.   Por favor, tenga en cuenta que aunque hacemos todo lo posible para estar disponibles para asuntos urgentes fuera del horario de [place], no estamos disponibles las 24 horas del da, los 7 [address]  [address] de la [place].   Si tiene un problema urgente y no puede comunicarse con nosotros, puede optar por buscar atencin mdica  en el consultorio de su doctor(a), en una clnica privada, en un centro de atencin urgente o en una sala de emergencias.  Si tiene Engineer, drilling, por favor llame inmediatamente al 911 o vaya a la sala de emergencias.  Nmeros de bper  - Dr. Gwen Pounds: [phone]  - Dra. Roseanne Reno: [phone]  - Dr. Katrinka Blazing: [phone]   En caso de inclemencias del  tiempo, por favor llame a [place] lnea principal al [phone] para una actualizacin sobre el [place] de cualquier retraso o cierre.  Consejos para la medicacin en dermatologa: Por favor, guarde las cajas en las que vienen los medicamentos de uso tpico para ayudarle a seguir las instrucciones sobre dnde y cmo usarlos. Las farmacias generalmente imprimen las instrucciones del medicamento slo en las cajas y no directamente en los tubos del [place].   Si su medicamento es muy caro, por favor, pngase en contacto con Rolm [place] llamando al [phone] y presione la opcin 4 o envenos un mensaje a travs de Clinical cytogeneticist.   No podemos decirle cul ser su copago por los medicamentos por adelantado ya que esto es diferente dependiendo de la cobertura de su seguro. Sin embargo, es posible que podamos encontrar un medicamento sustituto a Audiological scientist un formulario para que el seguro cubra el medicamento que se considera necesario.   Si se requiere una autorizacin previa para que su compaa de seguros [place] su medicamento, por favor permtanos de 1 a 2 das hbiles para  completar [address]  Los precios de los medicamentos varan con frecuencia dependiendo del Environmental consultant de dnde se surte la receta y alguna farmacias pueden ofrecer precios ms baratos.  El sitio web www.goodrx.com tiene cupones para medicamentos de Health and safety inspector. Los precios aqu no tienen en cuenta lo que podra costar con la ayuda del seguro (puede ser ms barato con su seguro), pero el sitio web puede darle el precio si no utiliz Tourist information centre manager.  - Puede imprimir el cupn correspondiente y llevarlo con su receta a la farmacia.  - Tambin puede pasar por nuestra oficina durante el horario de atencin regular y Education officer, museum una tarjeta de cupones de GoodRx.  - Si necesita que su receta se enve electrnicamente a una farmacia diferente, informe a nuestra oficina a travs de MyChart de [place] o por telfono llamando al [phone] y presione la opcin 4.

## 2023-07-11 ENCOUNTER — Encounter: Payer: Self-pay | Admitting: Dermatology

## 2023-07-15 ENCOUNTER — Ambulatory Visit: Payer: Medicare Other | Attending: Cardiovascular Disease

## 2023-07-15 DIAGNOSIS — I050 Rheumatic mitral stenosis: Secondary | ICD-10-CM

## 2023-07-15 DIAGNOSIS — Z5181 Encounter for therapeutic drug level monitoring: Secondary | ICD-10-CM | POA: Diagnosis not present

## 2023-07-15 DIAGNOSIS — I480 Paroxysmal atrial fibrillation: Secondary | ICD-10-CM | POA: Diagnosis not present

## 2023-07-15 LAB — POCT INR: INR: 4.4 — AB (ref 2.0–3.0)

## 2023-07-15 NOTE — Patient Instructions (Signed)
HOLD TODAY ONLY and EAT GREENS TODAY THEN Continue 1.5 tablets every day, EXCEPT 1 TABLET ON MONDAY AND FRIDAY.   Recheck INR in 2 weeks;

## 2023-07-18 ENCOUNTER — Other Ambulatory Visit: Payer: Self-pay | Admitting: Cardiovascular Disease

## 2023-07-21 IMAGING — CT CT ANGIO CHEST
2 of 6 series · 17 of 46 positions shown · IV contrast (APPLIED)
Comparison: 08/06/2021

CLINICAL DATA: Chest pain, shortness of breath, dyspnea, evaluate
for PE

EXAM:
CT ANGIOGRAPHY CHEST WITH CONTRAST
TECHNIQUE: Multidetector CT imaging of the chest was performed using the
standard protocol during bolus administration of intravenous
contrast. Multiplanar CT image reconstructions and MIPs were
obtained to evaluate the vascular anatomy.

[Series 7: thins · axial · 0.69mm/px · z∈[+1306,+1585]mm · 14 of 436 slices shown]
[im 19/436  lung]
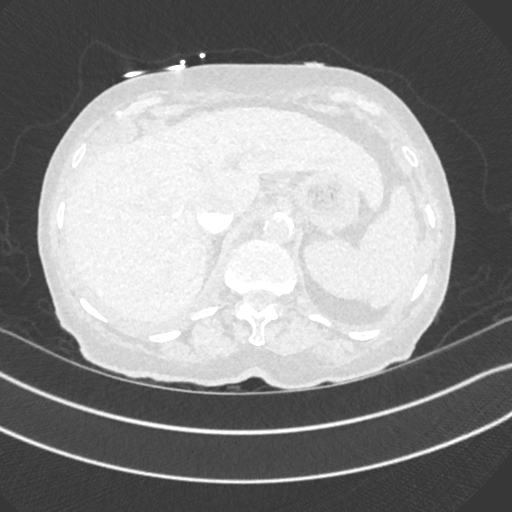
[im 57/436  soft-tissue]
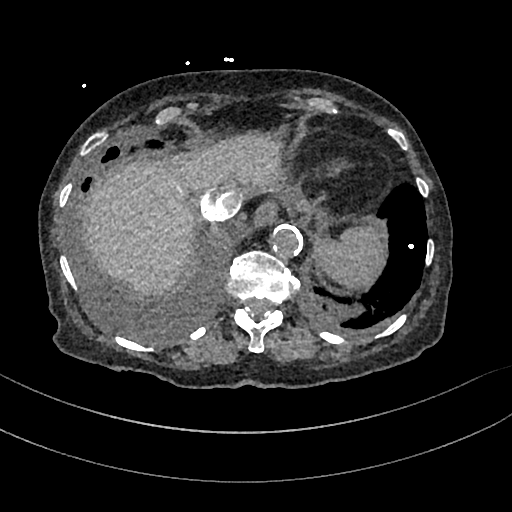
[im 76/436  lung]
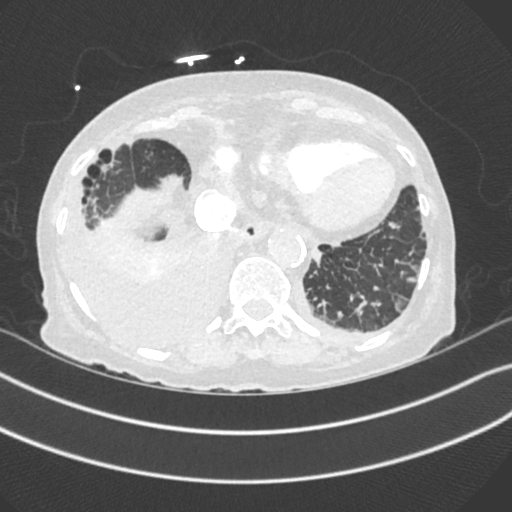
[im 114/436  soft-tissue]
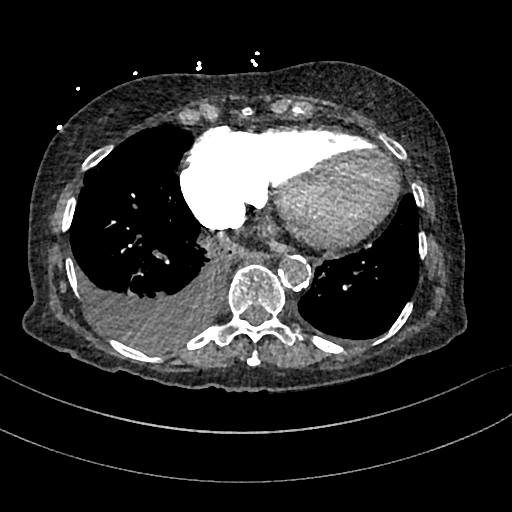
[im 152/436  lung]
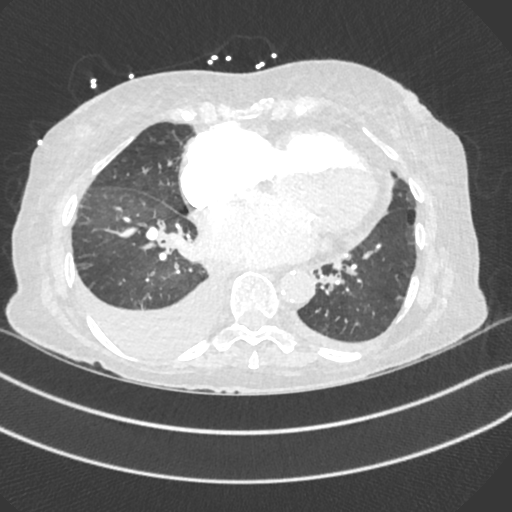
[im 171/436  soft-tissue]
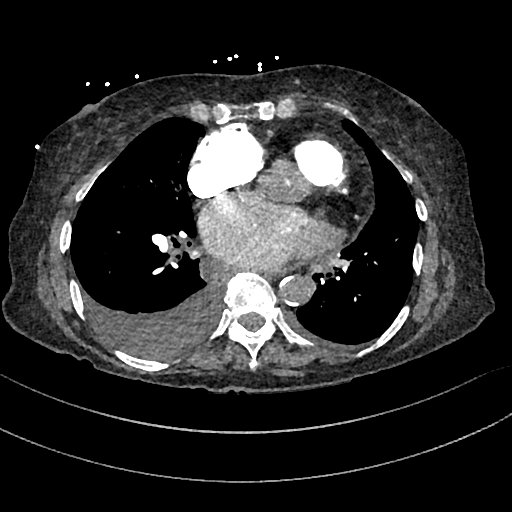
[im 209/436  lung]
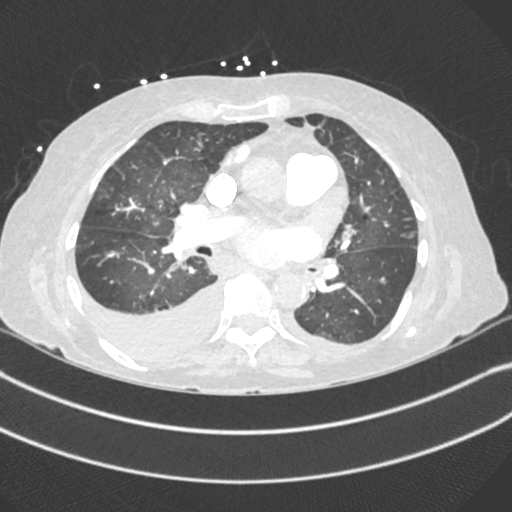
[im 227/436  soft-tissue]
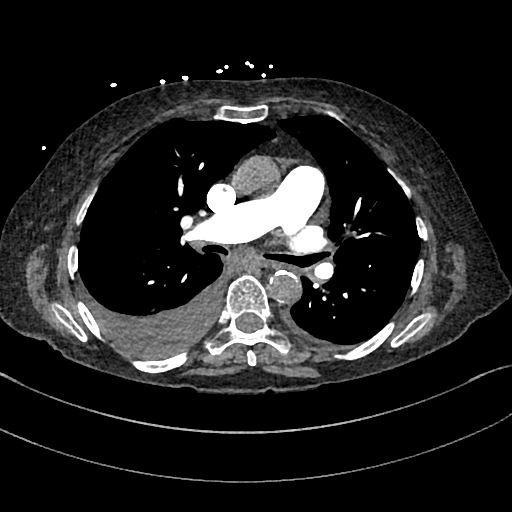
[im 265/436  lung]
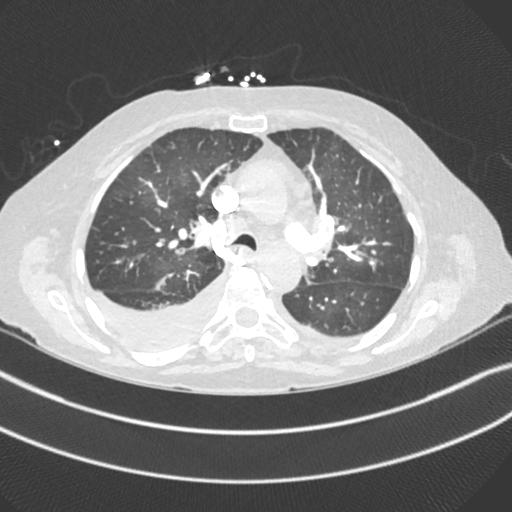
[im 284/436  soft-tissue]
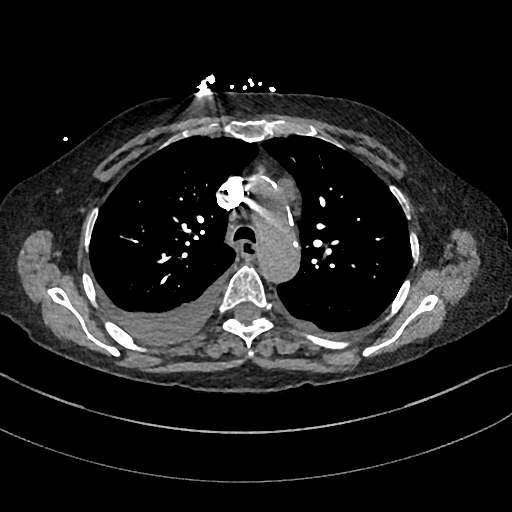
[im 322/436  lung]
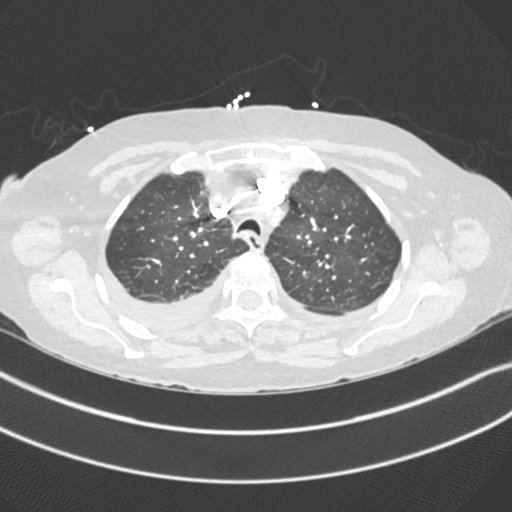
[im 360/436  soft-tissue]
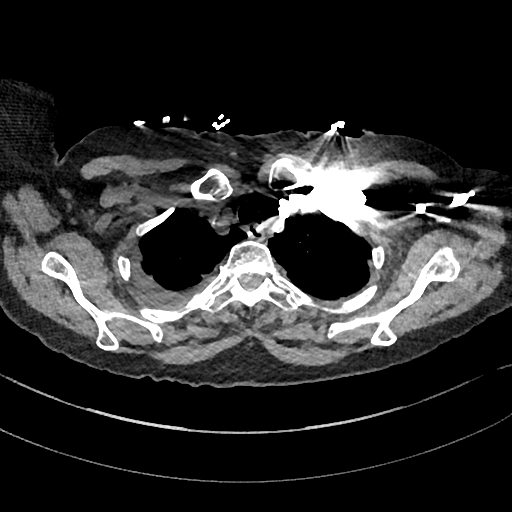
[im 379/436  lung]
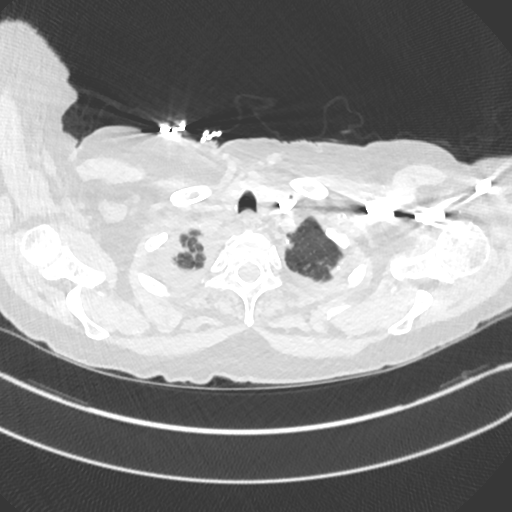
[im 417/436  soft-tissue]
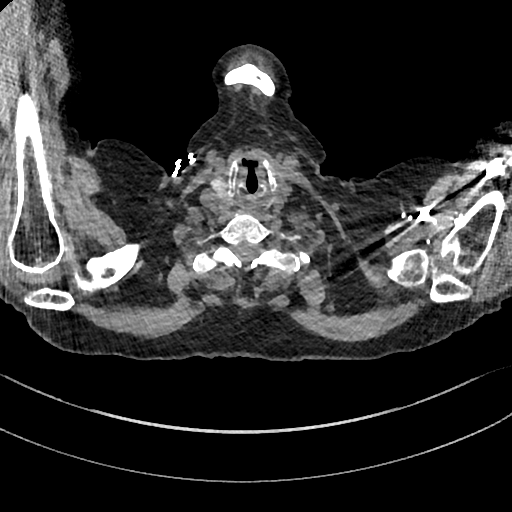

[Series 8: cor · coronal · 0.59mm/px · 3 of 132 slices shown]
[im 33/132  soft-tissue]
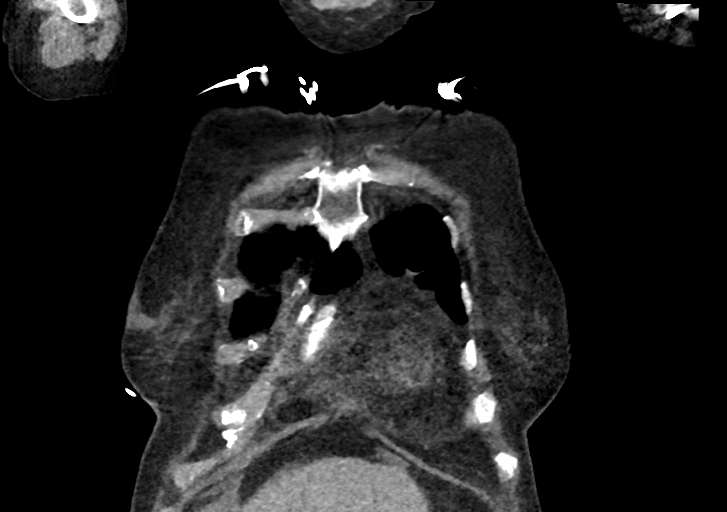
[im 66/132  soft-tissue]
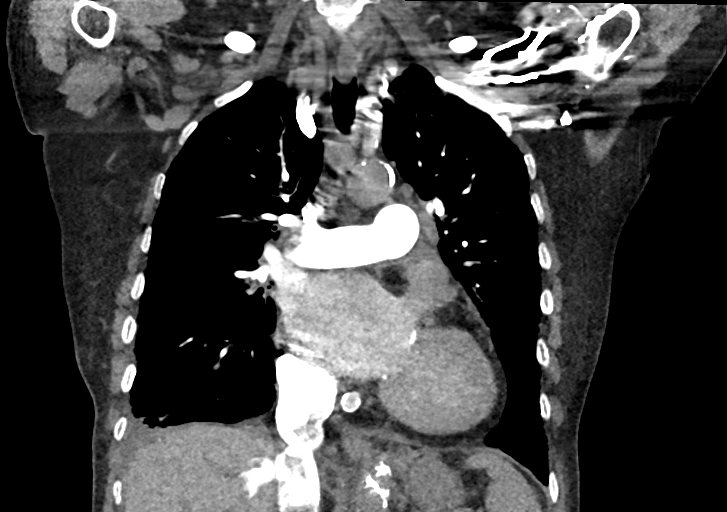
[im 99/132  soft-tissue]
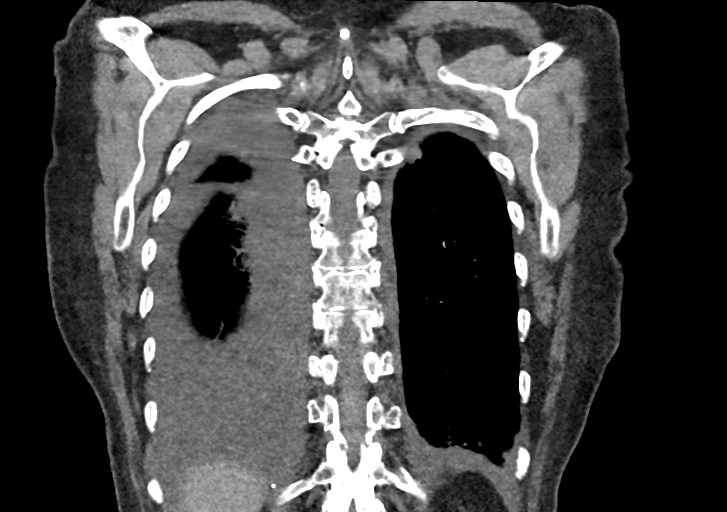

[17 of 46 positions shown; findings below may reference images not displayed]

RADIATION DOSE REDUCTION: This exam was performed according to the
departmental dose-optimization program which includes automated
exposure control, adjustment of the mA and/or kV according to
patient size and/or use of iterative reconstruction technique.

CONTRAST:  75mL OMNIPAQUE IOHEXOL 350 MG/ML SOLN
FINDINGS: Cardiovascular: Satisfactory opacification of the pulmonary arteries
to the segmental level. No evidence of pulmonary embolism.
Cardiomegaly. Left coronary artery calcifications. No pericardial
effusion.

Mediastinum/Nodes: Numerous enlarged mediastinal and hilar lymph
nodes are unchanged, largest prevascular node measuring 1.7 x 1.4 cm
(series 5, image 51). Thyroid gland, trachea, and esophagus
demonstrate no significant findings.

Lungs/Pleura: Moderate right, small left pleural effusions and
associated atelectasis or consolidation. Diffuse bilateral bronchial
wall thickening and bronchiolar plugging in the lung bases. Diffuse
interlobular septal thickening throughout.

Upper Abdomen: No acute abnormality.

Musculoskeletal: No chest wall abnormality. No acute osseous
findings.

Review of the MIP images confirms the above findings.
IMPRESSION: 1. Negative examination for pulmonary embolism.
2. Moderate right, small left pleural effusions and associated
atelectasis or consolidation.
3. Diffuse bilateral bronchial wall thickening and bronchiolar
plugging in the lung bases, consistent with nonspecific infectious
or inflammatory bronchitis most likely with superimposed component
of edema.
4. Diffuse interlobular septal thickening throughout, consistent
with pulmonary edema.
5. Numerous enlarged mediastinal and hilar lymph nodes are
unchanged, most likely reactive.
6. Cardiomegaly and coronary artery disease.

## 2023-07-27 ENCOUNTER — Ambulatory Visit
Admission: EM | Admit: 2023-07-27 | Discharge: 2023-07-27 | Disposition: A | Discharge status: Home / Self Care | Payer: Medicare Other | Attending: Emergency Medicine | Admitting: Emergency Medicine

## 2023-07-27 DIAGNOSIS — B349 Viral infection, unspecified: Secondary | ICD-10-CM

## 2023-07-27 LAB — POC COVID19/FLU A&B COMBO
Covid Antigen, POC: NEGATIVE
Influenza A Antigen, POC: NEGATIVE
Influenza B Antigen, POC: NEGATIVE

## 2023-07-27 NOTE — ED Provider Notes (Signed)
Renaldo Fiddler    CSN: [id number] Arrival date & time: 07/27/23  0944      History   Chief Complaint Chief Complaint  Patient presents with   Nasal Congestion   Weakness    HPI JAHNAVI CISCO is a 89 y.o. female.  Accompanied by her husband and grandson Freida Busman, patient presents with generalized weakness, congestion, sore throat, hoarse voice intermittently x 1 week.  Her symptoms had improved but then she started feeling "washed out" last night.  She does not currently have a sore throat or hoarseness.  She denies fever, ear pain, cough, unusual shortness of breath, or other symptoms.  Treatment with Mucinex.  Her medical history includes COPD, chronic respiratory failure with hypoxia, hypertension, heart failure.   The history is provided by the patient, medical records, a relative and the spouse.    Past Medical History:  Diagnosis Date   Aortic atherosclerosis (HCC)    a. 05/2021 TEE: GrIII atheroma plaque involving the asc, transverse, and desc Ao.   Asthma    CAD (coronary artery disease)    a. 04/2021 Cath: LM nl, LAD 85p/m, D1 80, RI nl, LCX nl, RCA nl; b. 07/2021 PCI: pLAD (2.75x26 Onyx Frontier DES), D1 (2.5x22 Onyx Frontier DES).   Carotid artery disease (HCC)    a. s/p R carotid stenting (9mm x 7mm x 4cm long Exact stent); b. 06/2021 U/S: RICA 40-59%, LICA 40-59%.   CHF (congestive heart failure) (HCC)    Community acquired pneumonia    Essential hypertension    Glaucoma    Hyperlipidemia    Mitral regurgitation    a. TTE 08/2015: EF 60-65%, normal wall motion, mild MR, mildly dilated left atrium measuring 40 mm, RVSF normal, PASP normal; b. 05/2021 TEE: Moderate MR.   Mitral stenosis    a. 05/2021 L/RHC: Sev MS w/ mean grad 13-20mmHg and MV area of 0.5-06.cm^2; b. 05/2021 TEE: EF 55-60%, no rwma, nl RV fxn, mod MR, mod MS (MV area by P1/2t: 1.61 cm^2 w/ mean grad of ).   Peripheral neuropathy    Persistent atrial fibrillation (HCC) 09/17/2015   a.  s/p DCCV 11/15/2015; b. CHADS2VASc => 4 (HTN, age x 2, female)--> warfarin; c. 06/2021 recurrent afib-->amio added.   Squamous cell carcinoma of skin 12/18/2021   R dorsum hand, EDC   Squamous cell carcinoma of skin 12/31/2021   R hand dorsum, recurrent - excised 02/04/2022   Squamous cell carcinoma of skin 12/31/2021   L forearm - ED&C   Squamous cell carcinoma of skin 06/24/2022   R thumb webspace with wart virus - ED&C   Squamous cell carcinoma of skin 10/20/2022   L lat knee - tx with [facility]    Patient Active Problem List   Diagnosis Date Noted   Acute respiratory distress 10/31/2022   Acute respiratory failure (HCC) 10/30/2022   COPD exacerbation (HCC) 08/06/2021   Chronic diastolic CHF (congestive heart failure) (HCC) 08/06/2021   Abnormal LFTs    Hypokalemia 08/03/2021   CAD (coronary artery disease) 08/02/2021   Moderate mitral regurgitation 06/17/2021   Mitral valve stenosis 05/17/2021   Dyspnea 01/01/2021   Chronic respiratory failure with hypoxia (HCC) 01/01/2021   PAD (peripheral artery disease) (HCC) 11/13/2020   Hypochromic microcytic anemia 09/17/2020   Hyperlipidemia 09/17/2020   Paroxysmal atrial fibrillation (HCC) 08/30/2020   Carotid stenosis, right 08/15/2020   Carotid stenosis 07/17/2020   Meningioma (HCC) 07/04/2020   Cyst of thyroid 07/03/2020   Hypothyroidism 07/03/2020  Long-term use of high-risk medication 11/11/2016   COPD with acute bronchitis (HCC) 11/11/2016   Lower extremity edema 05/29/2016   Elevated troponin    Hypoxia    Encounter for anticoagulation discussion and counseling    Premature ventricular contraction 03/26/2015   Vitamin D deficiency 03/26/2015   Peripheral neuropathy 03/26/2015   Hypertension 03/26/2015   Asthma exacerbation 03/26/2015    Past Surgical History:  Procedure Laterality Date   ABDOMINAL HYSTERECTOMY  1987   due to heavy bleeding   APPENDECTOMY     CARDIAC CATHETERIZATION     CAROTID PTA/STENT INTERVENTION  Right 08/15/2020   Procedure: CAROTID PTA/STENT INTERVENTION;  Surgeon: Annice Needy, MD;  Location: [facility] INVASIVE CV LAB;  Service: Cardiovascular;  Laterality: Right;   CATARACT EXTRACTION     CORONARY IMAGING/OCT N/A 08/02/2021   Procedure: INTRAVASCULAR IMAGING/OCT;  Surgeon: Yvonne Kendall, MD;  Location: [facility] INVAS[facility] CV LAB;  Service: Cardiovascular;  Laterality: N/A;   CORONARY STENT INTERVENTION N/A 08/02/2021   Procedure: CORONARY STENT INTERVENTION;  Surgeon: Yvonne Kendall, MD;  Location: [facility] INVASIVE CV LAB;  Service: Cardiovascular;  Laterality: N/A;   ELECTROPHYSIOLOGIC STUDY N/A 11/15/2015   Procedure: CARDIOVERSION;  Surgeon: Antonieta Iba, MD;  Location: [facility] ORS;  Service: Cardiovascular;  Laterality: N/A;   LEFT HEART CATH AND CORONARY ANGIOGRAPHY N/A 08/02/2021   Procedure: LEFT HEART CATH AND CORONARY ANGIOGRAPHY;  Surgeon: Yvonne Kendall, MD;  Location: [facility] CV [facility];  Service: Cardiovascular;  Laterality: N/A;   LEFT HEART CATH AND CORONARY ANGIOGRAPHY N/A 08/07/2021   Procedure: LEFT HEART CATH AND CORONARY ANGIOGRAPHY;  Surgeon: Iran Ouch, MD;  Location: [facility] INVASIVE CV LAB;  Service: Cardiovascular;  Laterality: N/A;   RIGHT/LEFT HEART CATH AND CORONARY ANGIOGRAPHY N/A 05/17/2021   Procedure: RIGHT/LEFT HEART CATH AND CORONARY ANGIOGRAPHY;  Surgeon: Yvonne Kendall, MD;  Location: [facility] INVASIVE CV LAB;  Service: Cardiovascular;  Laterality: N/A;   TEE WITHOUT CARDIOVERSION N/A 06/13/2021   Procedure: TRANSESOPHAGEAL ECHOCARDIOGRAM (TEE);  Surgeon: Antonieta Iba, MD;  Location: [facility] ORS;  Service: Cardiovascular;  Laterality: N/A;    OB History   No obstetric history on file.      Home Medications    Prior to Admission medications   Medication Sig Start Date End Date Taking? Authorizing Provider  Calcium Carbonate-Vitamin D 600-400 MG-UNIT tablet Take 1 tablet by mouth daily.    [provider]  cholecalciferol (VITAMIN D) 1000  UNITS tablet Take 1,000 Units by mouth daily.    [provider]  clopidogrel (PLAVIX) 75 MG tablet TAKE 1 TABLET BY MOUTH DAILY 04/13/23   Antonieta Iba, MD  diltiazem (CARDIZEM CD) 120 MG 24 hr capsule Take 1 capsule (120 mg total) by mouth daily. 01/07/23   Antonieta Iba, MD  dorzolamide (TRUSOPT) 2 % ophthalmic solution Place 1 drop into both eyes 2 (two) times daily.     [provider]  ezetimibe (ZETIA) 10 MG tablet TAKE 1 TABLET BY MOUTH DAILY 06/22/23   Antonieta Iba, MD  ipratropium-albuterol (DUONEB) 0.5-2.5 (3) MG/3ML SOLN TAKE BY NEBULIZATION EVERY 4 HOURS AS NEEDED 04/14/23   Malva Limes, MD  isosorbide mononitrate (IMDUR) 30 MG 24 hr tablet TAKE 1 TABLET BY MOUTH DAILY 02/12/23   Antonieta Iba, MD  latanoprost (XALATAN) 0.005 % ophthalmic solution Place 1 drop into both eyes at bedtime.     [provider]  levalbuterol Pauline Aus HFA) 45 MCG/ACT inhaler Inhale 2 puffs into the lungs  every 6 (six) hours as needed for wheezing or shortness of breath. 07/25/22   Malva Limes, MD  magnesium oxide (MAG-OX) 400 MG tablet Take 400 mg by mouth daily.    [provider]  Multiple Vitamins-Minerals (PRESERVISION AREDS 2+MULTI VIT) CAPS Take 2 capsules by mouth daily.    [provider]  nitroGLYCERIN (NITROSTAT) 0.4 MG SL tablet Place 1 tablet (0.4 mg total) under the tongue every 5 (five) minutes as needed (for chest pain or shortness of breath). 05/17/21   End, Cristal Deer, MD  potassium chloride (KLOR-CON) 10 MEQ tablet TAKE 2 TABLETS BY MOUTH DAILY. TAKE EXTRA 2 TABLETS WHEN TAKING EXTRA LASIX. 07/20/23   Antonieta Iba, MD  pregabalin (LYRICA) 50 MG capsule TAKE 1 CAPSULE BY MOUTH 2 TIMES DAILY 12/24/22   Malva Limes, MD  rosuvastatin (CRESTOR) 20 MG tablet TAKE 1 TABLET BY MOUTH DAILY 06/12/23   Antonieta Iba, MD  Tiotropium Bromide-Olodaterol (STIOLTO RESPIMAT) 2.5-2.5 MCG/ACT AERS Inhale 2 puffs into the lungs  daily. 01/28/22   Delma Freeze, FNP  torsemide (DEMADEX) 20 MG tablet TAKE 2 TABLETS BY MOUTH TWICE DAILY *DISCONTINUE FUROSEMIDE* 12/24/22   Malva Limes, MD  vitamin C (ASCORBIC ACID) 500 MG tablet Take 500 mg by mouth daily.    [provider]  vitamin E 100 UNIT capsule Take 200 Units by mouth daily.    [provider]  warfarin (COUMADIN) 2.5 MG tablet TAKE 1 & 1/2 TABLETS DAILY EXCEPT TAKE ONE TABLET ON MONDAYS, WEDNESDAYS, AND FRIDAYS OR AS DIRECTED BY COUMADIN CLINIC 05/27/23   Antonieta Iba, MD    Family History Family History  Problem Relation Age of Onset   CAD Mother    CAD Father    Cancer Son 30       lung cancer    Social History Social History   Tobacco Use   Smoking status: Never   Smokeless tobacco: Never   Tobacco comments:    never   Vaping Use   Vaping status: Never Used  Substance Use Topics   Alcohol use: No   Drug use: Never     Allergies   Shellfish allergy, Azithromycin, Tamiflu [oseltamivir phosphate], and Albuterol   Review of Systems Review of Systems  Constitutional:  Positive for fatigue. Negative for chills and fever.  HENT:  Negative for ear pain and sore throat.   Respiratory:  Negative for cough and shortness of breath.   Cardiovascular:  Negative for chest pain and palpitations.     Physical Exam Triage Vital Signs ED Triage Vitals  Encounter Vitals Group     BP 07/27/23 1009 107/70     Systolic BP Percentile --      Diastolic BP Percentile --      Pulse Rate 07/27/23 0958 75     Resp 07/27/23 0958 18     Temp 07/27/23 0958 (!) 97.5 F (36.4 C)     Temp src --      SpO2 07/27/23 0958 97 %     Weight --      Height --      Head Circumference --      Peak Flow --      Pain Score 07/27/23 0953 0     Pain Loc --      Pain Education --      Exclude from Growth Chart --    No data found.  Updated Vital Signs BP 107/70   Pulse 75  Temp (!) 97.5 F (36.4 C)   Resp 18   SpO2 97%   Visual  Acuity Right Eye Distance:   Left Eye Distance:   Bilateral Distance:    Right Eye Near:   Left Eye Near:    Bilateral Near:     Physical Exam Constitutional:      General: She is not in acute distress. HENT:     Right Ear: Tympanic membrane normal.     Left Ear: Tympanic membrane normal.     Nose: Nose normal.     Mouth/Throat:     Mouth: Mucous membranes are moist.     Pharynx: Oropharynx is clear.  Cardiovascular:     Rate and Rhythm: Normal rate and regular rhythm.     Heart sounds: Normal heart sounds.  Pulmonary:     Effort: Pulmonary effort is normal. No respiratory distress.     Breath sounds: Normal breath sounds.  Skin:    General: Skin is warm and dry.  Neurological:     Mental Status: She is alert.      UC Treatments / Results  Labs (all labs ordered are listed, but only abnormal results are displayed) Labs Reviewed  POC COVID19/FLU A&B COMBO    EKG   Radiology No results found.  Procedures Procedures (including critical care time)  Medications Ordered in UC Medications - No data to display  Initial Impression / Assessment and Plan / UC Course  I have reviewed the triage vital signs and the nursing notes.  Pertinent labs & imaging results that were available during my care of the patient were reviewed by me and considered in my medical decision making (see chart for details).    Viral illness.  Afebrile and vital signs are stable.  Rapid COVID and flu negative.  Discussed symptomatic treatment including Tylenol as needed for fever or discomfort, plain Mucinex as needed for congestion, rest, hydration.  Instructed patient to follow-up with PCP if not improving.  ED precautions given.  Patient agrees to plan of care.   Final Clinical Impressions(s) / UC Diagnoses   Final diagnoses:  Viral illness     Discharge Instructions      The COVID and flu tests are negative.   Take Tylenol as needed for fever or discomfort.  Take plain Mucinex  as needed for congestion.  Rest and keep yourself hydrated.    Follow-up with your primary care provider if your symptoms are not improving.         ED Prescriptions   None    PDMP not reviewed this encounter.   Mickie Bail, NP 07/27/23 1043

## 2023-07-27 NOTE — ED Triage Notes (Signed)
Patient to Urgent Care with complaints of sore throat/ weakness/ nasal congestion/ hoarseness. Denies any known fevers.   Symptoms started Monday. Reports yesterday she started feeling "washed out" and tired.   Taking Mucinex.

## 2023-07-27 NOTE — Discharge Instructions (Addendum)
The COVID and flu tests are negative.   Take Tylenol as needed for fever or discomfort.  Take plain Mucinex as needed for congestion.  Rest and keep yourself hydrated.    Follow-up with your primary care provider if your symptoms are not improving.

## 2023-07-29 ENCOUNTER — Ambulatory Visit: Payer: Self-pay

## 2023-07-29 ENCOUNTER — Ambulatory Visit: Payer: Medicare Other | Attending: Cardiovascular Disease

## 2023-07-29 DIAGNOSIS — Z5181 Encounter for therapeutic drug level monitoring: Secondary | ICD-10-CM | POA: Diagnosis not present

## 2023-07-29 DIAGNOSIS — I480 Paroxysmal atrial fibrillation: Secondary | ICD-10-CM

## 2023-07-29 DIAGNOSIS — I050 Rheumatic mitral stenosis: Secondary | ICD-10-CM | POA: Diagnosis not present

## 2023-07-29 LAB — POCT INR: INR: 4.6 — AB (ref 2.0–3.0)

## 2023-07-29 NOTE — Telephone Encounter (Signed)
  Chief Complaint: URI Symptoms: head and nasal congestion, fever  Frequency: sx x 1 week, fever today Pertinent Negatives: NA Disposition: [] ED /[] Urgent Care (no appt availability in office) / [] Appointment(In office/virtual)/ []  [place] [facility]/ [] Home Care/ [] Refused Recommended Disposition /[] [place] [facility]/ [x]  Follow-up with PCP Additional Notes: spoke with [name], pt's daughter. She states pt started with sx last Monday with laryngitis went to UC on 07/27/23 but didn't give pt anything except to use OTC. Now pt had fever today, took some Tylenol and did respond and resolve. Pt still doesn't feel good tho. [name] is asking if PCP can send in Zpac to help with sx. Would like sent to medication sent to [facility] if possible. Advised I would send message and have his nurse FU tomorrow. [name] agreed.   Summary: Requesting antibiotic   Pt's daughter calling, pt seen at [facility] along with husband and both dx with something viral. Pt called daughter earlier around 2:45pm and had a fever of 99 but had taken some tylenol. Daughter inquiring whether pt could have a zpack sent in. UC did send medication for husband but not for patient.  Daughter requesting callback         Reason for Disposition  [1] Fever returns after gone for over 24 hours AND [2] symptoms worse or not improved  Answer Assessment - Initial Assessment Questions 1. ONSET: "When did the nasal discharge start?"      Monday last week  2. AMOUNT: "How much discharge is there?"      Some  3. COUGH: "Do you have a cough?" If Yes, ask: "Describe the color of your sputum" (clear, white, yellow, green)     yes 5. FEVER: "Do you have a fever?" If Yes, ask: "What is your temperature, how was it measured, and when did it start?"     Yes has 99 fever today  6. SEVERITY: "Overall, how bad are you feeling right now?" (e.g., doesn't interfere with normal activities, staying home from school/work, staying in bed)      Doesn't feel so  good  7. OTHER SYMPTOMS: "Do you have any other symptoms?" (e.g., sore throat, earache, wheezing, vomiting)     Head congestion  Protocols used: Common Cold-A-AH

## 2023-07-29 NOTE — Patient Instructions (Signed)
HOLD TODAY ONLY and EAT GREENS TODAY THEN Decrease to 1.5 tablets every day, EXCEPT 1 TABLET ON MONDAY, WEDNESDAY AND FRIDAY.  EAT 2 HELPINGS OF GREENS PER WEEK.   Recheck INR in 3 weeks;

## 2023-07-30 ENCOUNTER — Other Ambulatory Visit: Payer: Self-pay | Admitting: Family Medicine

## 2023-07-30 DIAGNOSIS — J209 Acute bronchitis, unspecified: Secondary | ICD-10-CM

## 2023-07-30 MED ORDER — LEVOFLOXACIN 500 MG PO TABS
500.0000 mg | ORAL_TABLET | Freq: Every day | ORAL | 0 refills | Status: AC
Start: 2023-07-30 — End: 2023-08-06

## 2023-07-30 MED ORDER — PREDNISONE 50 MG PO TABS
50.0000 mg | ORAL_TABLET | Freq: Every day | ORAL | 0 refills | Status: DC
Start: 2023-07-30 — End: 2023-08-03

## 2023-07-31 NOTE — Telephone Encounter (Signed)
Pt's daughter advised. Verbalized understanding. Would like to know Dr.Fishers first openings. Advised of same day time slots Monday with Dr.Fisher. She reports she will contact her mother to make sure she will be able to come in and give [place] a call back. Ok for [facility] to schedule in same day slots

## 2023-08-01 ENCOUNTER — Other Ambulatory Visit: Payer: Self-pay | Admitting: Family Medicine

## 2023-08-03 ENCOUNTER — Ambulatory Visit (INDEPENDENT_AMBULATORY_CARE_PROVIDER_SITE_OTHER): Payer: Medicare Other | Admitting: Family Medicine

## 2023-08-03 ENCOUNTER — Ambulatory Visit
Admission: RE | Admit: 2023-08-03 | Discharge: 2023-08-03 | Disposition: A | Discharge status: Home / Self Care | Payer: Medicare Other | Source: Ambulatory Visit | Attending: Family Medicine | Admitting: Family Medicine

## 2023-08-03 VITALS — BP 106/59 | HR 81 | Temp 97.5°F | Resp 16 | Ht 65.0 in | Wt 125.6 lb

## 2023-08-03 DIAGNOSIS — R062 Wheezing: Secondary | ICD-10-CM | POA: Diagnosis not present

## 2023-08-03 DIAGNOSIS — R052 Subacute cough: Secondary | ICD-10-CM | POA: Diagnosis not present

## 2023-08-03 DIAGNOSIS — R5383 Other fatigue: Secondary | ICD-10-CM | POA: Diagnosis not present

## 2023-08-03 DIAGNOSIS — R059 Cough, unspecified: Secondary | ICD-10-CM | POA: Diagnosis not present

## 2023-08-03 DIAGNOSIS — R0602 Shortness of breath: Secondary | ICD-10-CM | POA: Diagnosis not present

## 2023-08-03 MED ORDER — PREDNISONE 10 MG PO TABS: ORAL_TABLET | ORAL | 0 refills | Status: DC

## 2023-08-03 NOTE — Telephone Encounter (Signed)
Requested medication (s) are due for refill today: yes  Requested medication (s) are on the active medication list: yes  Last refill:  12/24/22 #60/5  Future visit scheduled: no  Notes to clinic:  Unable to refill per protocol, cannot delegate.    Requested Prescriptions  Pending Prescriptions Disp Refills   pregabalin (LYRICA) 50 MG capsule [Pharmacy Med Name: PREGABALIN 50 MG CAP] 60 capsule     Sig: TAKE 1 CAPSULE BY MOUTH 2 TIMES DAILY     Not Delegated - Neurology:  Anticonvulsants - Controlled - pregabalin Failed - 08/03/2023 12:16 PM      Failed - This refill cannot be delegated      Failed - Cr in normal range and within 360 days    Creatinine  Date Value Ref Range Status  05/10/2012 0.99 0.60 - 1.30 mg/dL Final   Creatinine, Ser  Date Value Ref Range Status  05/30/2023 1.08 (H) 0.44 - 1.00 mg/dL Final         Passed - Completed PHQ-2 or PHQ-9 in the last 360 days      Passed - Valid encounter within last 12 months    Recent Outpatient Visits           Today Subacute cough   [facility] Malva Limes, MD   7 months ago Hyperglycemia   [facility] Malva Limes, MD   8 months ago COPD exacerbation [facility])   [facility] Malva Limes, MD   1 year ago COVID-19   [facility] Malva Limes, MD   1 year ago Need for immunization against influenza   [facility] Malva Limes, MD       Future Appointments             In 11 months Deirdre Evener, MD [facility]

## 2023-08-03 NOTE — Patient Instructions (Addendum)
 Please review the attached list of medications and notify my office if there are any errors.   Go to [facility] for your chest X-rays (phone no. [phone])

## 2023-08-03 NOTE — Progress Notes (Signed)
Established patient visit   Patient: Molly Hodges   DOB: May 16, 1932   89 y.o. Female  MRN: [id number] Visit Date: 08/03/2023  Today's healthcare provider: Mila Merry, MD   Chief Complaint  Patient presents with   URI    Sore throat ,weak, SOB, wheezing, something wrong with lungs- its been 3 weeks Yesterday unsteady on feet couldn't walk had to use walker -went to walk in clinic and has viral infection   Subjective    Discussed the use of AI scribe software for clinical note transcription with the patient, who gave verbal consent to proceed.  History of Present Illness   The patient presents with a three-week history of progressive illness. The illness began with laryngitis and a sore throat, which resolved after a few days of Mucinex. However, the patient subsequently developed shortness of breath, extreme fatigue, and a lack of energy. She also reports a recent episode of unsteadiness requiring the use of a walker.  The patient sought care at a walk-in clinic on 07/27/23, where she was diagnosed with a viral illness.  She had negative flu and covid tests done at [facility]. She called her on the 11th as she was not impoving and prescribed Levaquin and shot course of prednisone 50mg .The patient reports some improvement in breathing with prednisone, but overall, she feels her condition is deteriorating.  The patient also reports nervousness and poor sleep. She denies any sore throat or nasal discharge but reports a persistent cough with clear sputum and wheezing.  The patient's Coumadin therapy has recently been decreased by Dr. Mariah Milling due to high INR.  She also takes prescribed Vitamin D and over-the-counter Vitamin C.       Medications: Outpatient Medications Prior to Visit  Medication Sig   Calcium Carbonate-Vitamin D 600-400 MG-UNIT tablet Take 1 tablet by mouth daily.   cholecalciferol (VITAMIN D) 1000 UNITS tablet Take 1,000 Units by mouth daily.   clopidogrel (PLAVIX)  75 MG tablet TAKE 1 TABLET BY MOUTH DAILY   diltiazem (CARDIZEM CD) 120 MG 24 hr capsule Take 1 capsule (120 mg total) by mouth daily.   dorzolamide (TRUSOPT) 2 % ophthalmic solution Place 1 drop into both eyes 2 (two) times daily.    ezetimibe (ZETIA) 10 MG tablet TAKE 1 TABLET BY MOUTH DAILY   ipratropium-albuterol (DUONEB) 0.5-2.5 (3) MG/3ML SOLN TAKE BY NEBULIZATION EVERY 4 HOURS AS NEEDED   isosorbide mononitrate (IMDUR) 30 MG 24 hr tablet TAKE 1 TABLET BY MOUTH DAILY   latanoprost (XALATAN) 0.005 % ophthalmic solution Place 1 drop into both eyes at bedtime.    levalbuterol (XOPENEX HFA) 45 MCG/ACT inhaler Inhale 2 puffs into the lungs every 6 (six) hours as needed for wheezing or shortness of breath.   levofloxacin (LEVAQUIN) 500 MG tablet Take 1 tablet (500 mg total) by mouth daily for 7 days.   magnesium oxide (MAG-OX) 400 MG tablet Take 400 mg by mouth daily.   Multiple Vitamins-Minerals (PRESERVISION AREDS 2+MULTI VIT) CAPS Take 2 capsules by mouth daily.   nitroGLYCERIN (NITROSTAT) 0.4 MG SL tablet Place 1 tablet (0.4 mg total) under the tongue every 5 (five) minutes as needed (for chest pain or shortness of breath).   potassium chloride (KLOR-CON) 10 MEQ tablet TAKE 2 TABLETS BY MOUTH DAILY. TAKE EXTRA 2 TABLETS WHEN TAKING EXTRA LASIX.   pregabalin (LYRICA) 50 MG capsule TAKE 1 CAPSULE BY MOUTH 2 TIMES DAILY   rosuvastatin (CRESTOR) 20 MG tablet TAKE 1 TABLET BY  MOUTH DAILY   Tiotropium Bromide-Olodaterol (STIOLTO RESPIMAT) 2.5-2.5 MCG/ACT AERS Inhale 2 puffs into the lungs daily.   torsemide (DEMADEX) 20 MG tablet TAKE 2 TABLETS BY MOUTH TWICE DAILY *DISCONTINUE FUROSEMIDE*   vitamin C (ASCORBIC ACID) 500 MG tablet Take 500 mg by mouth daily.   vitamin E 100 UNIT capsule Take 200 Units by mouth daily.   warfarin (COUMADIN) 2.5 MG tablet TAKE 1 & 1/2 TABLETS DAILY EXCEPT TAKE ONE TABLET ON MONDAYS, WEDNESDAYS, AND FRIDAYS OR AS DIRECTED BY COUMADIN CLINIC   [Finished] predniSONE  (DELTASONE) 50 MG tablet Take 1 tablet (50 mg total) by mouth daily with breakfast.   No facility-administered medications prior to visit.   Review of Systems     Objective    BP (!) 106/59 (BP Location: Left Arm, Patient Position: Sitting, Cuff Size: Normal)   Pulse 81   Temp (!) 97.5 F (36.4 C)   Resp 16   Ht 5\' 5"  (1.651 m)   Wt 125 lb 9.6 oz (57 kg)   SpO2 (!) 87%   BMI 20.90 kg/m   Physical Exam   General: Appearance:    Well developed, well nourished female in no acute distress  Eyes:    PERRL, conjunctiva/corneas clear, EOM's intact       Lungs:     Occasional expiratory wheezes, no rales, respirations unlabored  Heart:    Normal heart rate. Irregularly irregular rhythm.  2/6 blowing, holosystolic murmur at apex 2/6 low pitched, rumbling, decrescendo, diastolic murmur at apex   MS:   All extremities are intact.    Neurologic:   Awake, alert, oriented x 3. No apparent focal neurological defect.         Assessment & Plan        Respiratory infection Persistent cough, wheezing, and fatigue for 3 weeks. Initial diagnosis of viral illness with no improvement on Levaquin and short course of Prednisone. No fever or purulent sputum. -Order chest x-ray at [facility] imaging center. -Order blood work to assess for ongoing infection. -Start Prednisone taper over 10 days. -Continue Levaquin until further assessment based on lab and imaging results.  Follow-up Pending results of chest x-ray and blood work.         Mila Merry, MD  [facility] [phone] (phone) [phone] (fax)  [facility]

## 2023-08-04 ENCOUNTER — Encounter: Payer: Self-pay | Admitting: *Deleted

## 2023-08-04 ENCOUNTER — Other Ambulatory Visit: Payer: Self-pay | Admitting: Family Medicine

## 2023-08-04 ENCOUNTER — Other Ambulatory Visit: Payer: Self-pay

## 2023-08-04 ENCOUNTER — Emergency Department
Admission: EM | Admit: 2023-08-04 | Discharge: 2023-08-04 | Disposition: A | Discharge status: Home / Self Care | Payer: Medicare Other | Attending: Student in an Organized Health Care Education/Training Program | Admitting: Student in an Organized Health Care Education/Training Program

## 2023-08-04 ENCOUNTER — Telehealth: Payer: Self-pay | Admitting: Family Medicine

## 2023-08-04 ENCOUNTER — Emergency Department: Payer: Medicare Other

## 2023-08-04 DIAGNOSIS — R531 Weakness: Secondary | ICD-10-CM | POA: Insufficient documentation

## 2023-08-04 DIAGNOSIS — R059 Cough, unspecified: Secondary | ICD-10-CM | POA: Diagnosis not present

## 2023-08-04 DIAGNOSIS — R002 Palpitations: Secondary | ICD-10-CM | POA: Diagnosis present

## 2023-08-04 DIAGNOSIS — I480 Paroxysmal atrial fibrillation: Secondary | ICD-10-CM | POA: Insufficient documentation

## 2023-08-04 DIAGNOSIS — R0602 Shortness of breath: Secondary | ICD-10-CM | POA: Diagnosis not present

## 2023-08-04 DIAGNOSIS — R6889 Other general symptoms and signs: Secondary | ICD-10-CM | POA: Diagnosis not present

## 2023-08-04 LAB — CBC WITH DIFFERENTIAL/PLATELET
Abs Immature Granulocytes: 0.04 10*3/uL (ref 0.00–0.07)
Basophils Absolute: 0.1 10*3/uL (ref 0.0–0.2)
Basophils Absolute: 0 10*3/uL (ref 0.0–0.1)
Basophils Relative: 0 %
Basos: 0 %
EOS (ABSOLUTE): 0.1 10*3/uL (ref 0.0–0.4)
Eos: 0 %
Eosinophils Absolute: 0 10*3/uL (ref 0.0–0.5)
Eosinophils Relative: 0 %
HCT: 38.1 % (ref 36.0–46.0)
Hematocrit: 39.2 % (ref 34.0–46.6)
Hemoglobin: 11.5 g/dL (ref 11.1–15.9)
Hemoglobin: 11.9 g/dL — ABNORMAL LOW (ref 12.0–15.0)
Immature Grans (Abs): 0.1 10*3/uL (ref 0.0–0.1)
Immature Granulocytes: 1 %
Immature Granulocytes: 1 %
Lymphocytes Absolute: 1.9 10*3/uL (ref 0.7–3.1)
Lymphocytes Relative: 7 %
Lymphs Abs: 0.5 10*3/uL — ABNORMAL LOW (ref 0.7–4.0)
Lymphs: 13 %
MCH: 23.4 pg — ABNORMAL LOW (ref 26.6–33.0)
MCH: 23.7 pg — ABNORMAL LOW (ref 26.0–34.0)
MCHC: 29.3 g/dL — ABNORMAL LOW (ref 31.5–35.7)
MCHC: 31.2 g/dL (ref 30.0–36.0)
MCV: 80 fL (ref 79–97)
MCV: 75.7 fL — ABNORMAL LOW (ref 80.0–100.0)
Monocytes Absolute: 1.4 10*3/uL — ABNORMAL HIGH (ref 0.1–0.9)
Monocytes Absolute: 0.3 10*3/uL (ref 0.1–1.0)
Monocytes Relative: 3 %
Monocytes: 9 %
Neutro Abs: 7.2 10*3/uL (ref 1.7–7.7)
Neutrophils Absolute: 12 10*3/uL — ABNORMAL HIGH (ref 1.4–7.0)
Neutrophils Relative %: 89 %
Neutrophils: 77 %
Platelets: 220 10*3/uL (ref 150–450)
Platelets: 203 10*3/uL (ref 150–400)
RBC: 4.92 x10E6/uL (ref 3.77–5.28)
RBC: 5.03 MIL/uL (ref 3.87–5.11)
RDW: 15.6 % — ABNORMAL HIGH (ref 11.7–15.4)
RDW: 16.3 % — ABNORMAL HIGH (ref 11.5–15.5)
WBC: 15.5 10*3/uL — ABNORMAL HIGH (ref 3.4–10.8)
WBC: 8.1 10*3/uL (ref 4.0–10.5)
nRBC: 0 % (ref 0.0–0.2)

## 2023-08-04 LAB — BASIC METABOLIC PANEL
Anion gap: 15 (ref 5–15)
BUN: 33 mg/dL — ABNORMAL HIGH (ref 8–23)
CO2: 20 mmol/L — ABNORMAL LOW (ref 22–32)
Calcium: 8.2 mg/dL — ABNORMAL LOW (ref 8.9–10.3)
Chloride: 103 mmol/L (ref 98–111)
Creatinine, Ser: 1.26 mg/dL — ABNORMAL HIGH (ref 0.44–1.00)
GFR, Estimated: 41 mL/min — ABNORMAL LOW (ref >60)
Glucose, Bld: 192 mg/dL — ABNORMAL HIGH (ref 70–99)
Potassium: 4.2 mmol/L (ref 3.5–5.1)
Sodium: 138 mmol/L (ref 135–145)

## 2023-08-04 LAB — COMPREHENSIVE METABOLIC PANEL
ALT: 17 [IU]/L (ref 0–32)
AST: 20 [IU]/L (ref 0–40)
Albumin: 4.1 g/dL (ref 3.7–4.7)
Alkaline Phosphatase: 104 [IU]/L (ref 44–121)
BUN/Creatinine Ratio: 27 (ref 12–28)
BUN: 31 mg/dL — ABNORMAL HIGH (ref 8–27)
Bilirubin Total: 0.2 mg/dL (ref 0.0–1.2)
CO2: 21 mmol/L (ref 20–29)
Calcium: 8.6 mg/dL — ABNORMAL LOW (ref 8.7–10.3)
Chloride: 102 mmol/L (ref 96–106)
Creatinine, Ser: 1.16 mg/dL — ABNORMAL HIGH (ref 0.57–1.00)
Globulin, Total: 2.1 g/dL (ref 1.5–4.5)
Glucose: 90 mg/dL (ref 70–99)
Potassium: 3.5 mmol/L (ref 3.5–5.2)
Sodium: 144 mmol/L (ref 134–144)
Total Protein: 6.2 g/dL (ref 6.0–8.5)
eGFR: 45 mL/min/{1.73_m2} — ABNORMAL LOW (ref >59)

## 2023-08-04 LAB — BRAIN NATRIURETIC PEPTIDE: B Natriuretic Peptide: 401 pg/mL — ABNORMAL HIGH (ref 0.0–100.0)

## 2023-08-04 LAB — PROTIME-INR
INR: 2.6 — ABNORMAL HIGH (ref 0.8–1.2)
Prothrombin Time: 27.9 s — ABNORMAL HIGH (ref 11.4–15.2)

## 2023-08-04 NOTE — ED Provider Triage Note (Signed)
Emergency Medicine Provider Triage Evaluation Note  HIRA KNITTER , a 89 y.o. female  was evaluated in triage.  Pt complains of subacute cough. She was seen by her PCP yesterday, started on levaquin and prednisone taper. She had a normal chest xray.   Review of Systems  Positive: SOB, cough, weakness Negative:   Physical Exam  BP 129/82 (BP Location: Left Arm)   Pulse 90   Temp 98.4 F (36.9 C) (Oral)   Resp 20   Ht 5\' 5"  (1.651 m)   Wt 57 kg   SpO2 99%   BMI 20.91 kg/m  Gen:   Awake, no distress   Resp:  Normal effort  MSK:   Moves extremities without difficulty  Other:    Medical Decision Making  Medically screening exam initiated at 4:01 PM.  Appropriate orders placed.  Summers Miura Senters was informed that the remainder of the evaluation will be completed by another provider, this initial triage assessment does not replace that evaluation, and the importance of remaining in the ED until their evaluation is complete.     Cameron Ali, PA-C 08/04/23 1603

## 2023-08-04 NOTE — ED Notes (Signed)
Pt assisted to and from bathroom. Patient now resting in bed free from sign of distress. Breathing unlabored speaking in full sentences with symmetric chest rise and fall. Bed low and locked with side rails raised x2. Call bell in reach

## 2023-08-04 NOTE — Discharge Instructions (Addendum)
Please call tomorrow to your cardiologist and make an appointment for tomorrow.  Keep taking all your medicines

## 2023-08-04 NOTE — ED Notes (Signed)
ED Provider at bedside. 

## 2023-08-04 NOTE — ED Notes (Signed)
Patient discharged at this time. Wheeled to lobby per request and assisted into vehicle. Breathing unlabored speaking in full sentences. Verbalized understanding of all discharge, follow up, and medication teaching. Discharged homed with all belongings.

## 2023-08-04 NOTE — ED Triage Notes (Signed)
Pt comes via EMs from home with c/o cold like symptoms for about 3 weeks. Pt seen at [facility] and PCP and everything was neg. Pt did have chest xray yesterday. Pt still not feeling good. Pt does have cough but nonproductive.  Urine had strong odor.

## 2023-08-04 NOTE — ED Triage Notes (Signed)
Pt brought in via ems from home   pt has a cough and congestion for 3 weeks.  Progressively worse since yesterday.  Pt taking prednisone today x 1   saw dr Sherrie Mustache yesterday.   Pt alert  speech clear

## 2023-08-04 NOTE — Telephone Encounter (Signed)
Patient went to [facility] yesterday for chest xray with suspected pneumonia. Can you please check with [facility] to see if it can be read.

## 2023-08-04 NOTE — ED Provider Notes (Signed)
[facility] Provider Note    Event Date/Time   First MD Initiated Contact with Patient 08/04/23 2050     (approximate)   History   Cough   HPI  Molly Hodges is a 89 y.o. female is brought today via EMS from home.  Patient refers having cough in the last 3 weeks.  Patient saw her PCP yesterday who prescribed prednisone.  Patient took today 5 tabs of prednisone.  Patient refers having palpitations, shortness of breath, generalized weakness.  Patient has history of A-fib diagnosed 4 years ago and treated.      Physical Exam   Triage Vital Signs: ED Triage Vitals  Encounter Vitals Group     BP 08/04/23 1559 129/82     Systolic BP Percentile --      Diastolic BP Percentile --      Pulse Rate 08/04/23 1559 90     Resp 08/04/23 1559 20     Temp 08/04/23 1559 98.4 F (36.9 C)     Temp Source 08/04/23 1559 Oral     SpO2 08/04/23 1559 99 %     Weight 08/04/23 1600 125 lb 10.6 oz (57 kg)     Height 08/04/23 1600 5\' 5"  (1.651 m)     Head Circumference --      Peak Flow --      Pain Score 08/04/23 1559 5     Pain Loc --      Pain Education --      Exclude from Growth Chart --     Most recent vital signs: Vitals:   08/04/23 1559  BP: 129/82  Pulse: 90  Resp: 20  Temp: 98.4 F (36.9 C)  SpO2: 99%    Constitutional: Alert mild distressed Eyes: Conjunctivae are normal.  Head: Atraumatic. Nose: No congestion/rhinnorhea. Mouth/Throat: Mucous membranes are moist.  No erythema Neck: Painless ROM.  Cardiovascular:   Good peripheral circulation. Respiratory: Normal respiratory effort.  No retractions.  No wheezing Gastrointestinal: Soft and nontender.  Musculoskeletal:  no deformity Neurologic:  MAE spontaneously. No gross focal neurologic deficits are appreciated.  Skin:  Skin is warm, dry and intact. No rash noted. Psychiatric: Mood and affect are normal. Speech and behavior are normal.    ED Results / Procedures / Treatments    Labs (all labs ordered are listed, but only abnormal results are displayed) Labs Reviewed  BASIC METABOLIC PANEL - Abnormal; Notable for the following components:      Result Value   CO2 20 (*)    Glucose, Bld 192 (*)    BUN 33 (*)    Creatinine, Ser 1.26 (*)    Calcium 8.2 (*)    GFR, Estimated 41 (*)    All other components within normal limits  BRAIN NATRIURETIC PEPTIDE - Abnormal; Notable for the following components:   B Natriuretic Peptide 401.0 (*)    All other components within normal limits  CBC WITH DIFFERENTIAL/PLATELET - Abnormal; Notable for the following components:   Hemoglobin 11.9 (*)    MCV 75.7 (*)    MCH 23.7 (*)    RDW 16.3 (*)    Lymphs Abs 0.5 (*)    All other components within normal limits  PROTIME-INR     EKG See physician read    RADIOLOGY    PROCEDURES:  Critical Care performed:   Procedures   MEDICATIONS ORDERED IN ED: Medications - No data to display   IMPRESSION / MDM / ASSESSMENT AND PLAN /  ED COURSE  I reviewed the triage vital signs and the nursing notes.  Differential diagnosis includes, but is not limited to, atrial fibrillation, pneumonia, heart failure  Patient's presentation is most consistent with acute complicated illness / injury requiring diagnostic workup.  Patient's diagnosis is consistent with paroxysmal atrial fibrillation. I independently reviewed and interpreted imaging and agree with radiologists findings. Labs are rea reassuring. I did review the patient's allergies and medications. Patient will be discharged home. Patient is to follow up with her cardiology as directed. Patient is given ED precautions to return to the ED for any worsening or new symptoms. Discussed plan of care with patient, answered all of patient's questions, Patient agreeable to plan of care. Advised patient to take medications according to the instructions on the label. Discussed possible side effects of new medications. Patient  verbalized understanding. Clinical Course as of 08/04/23 2214  Tue Aug 04, 2023  2142 DG Chest 2 View Hyperexpanded lungs, no acute cardiopulmonary disease [AE]    Clinical Course User Index [AE] Gladys Damme, PA-C     FINAL CLINICAL IMPRESSION(S) / ED DIAGNOSES   Final diagnoses:  Paroxysmal atrial fibrillation [facility])     Rx / DC Orders   ED Discharge Orders          Ordered    Ambulatory referral to Cardiology        08/04/23 2202             Note:  This document was prepared using Dragon voice recognition software and may include unintentional dictation errors.   Gladys Damme, PA-C 08/04/23 2215    Willy Eddy, MD 08/04/23 [phone]

## 2023-08-04 NOTE — ED Notes (Signed)
Called pt family for ride home

## 2023-08-04 NOTE — Telephone Encounter (Signed)
Requested medication (s) are due for refill today: No  Requested medication (s) are on the active medication list: Yes  Last refill:  08/04/23 #60, 5RF  Future visit scheduled: No  Notes to clinic:  Unable to refill per protocol, cannot delegate. Medication already reordered today by PCP     Requested Prescriptions  Pending Prescriptions Disp Refills   pregabalin (LYRICA) 50 MG capsule [Pharmacy Med Name: PREGABALIN 50 MG CAP] 60 capsule     Sig: TAKE 1 CAPSULE BY MOUTH 2 TIMES DAILY     Not Delegated - Neurology:  Anticonvulsants - Controlled - pregabalin Failed - 08/04/2023  4:57 PM      Failed - This refill cannot be delegated      Failed - Cr in normal range and within 360 days    Creatinine  Date Value Ref Range Status  05/10/2012 0.99 0.60 - 1.30 mg/dL Final   Creatinine, Ser  Date Value Ref Range Status  08/04/2023 1.26 (H) 0.44 - 1.00 mg/dL Final         Passed - Completed PHQ-2 or PHQ-9 in the last 360 days      Passed - Valid encounter within last 12 months    Recent Outpatient Visits           Yesterday Subacute cough   [facility] Malva Limes, MD   7 months ago Hyperglycemia   [facility] Malva Limes, MD   9 months ago COPD exacerbation [facility])    [facility] Malva Limes, MD   1 year ago COVID-19   [facility] Malva Limes, MD   1 year ago Need for immunization against influenza   [facility] Malva Limes, MD       Future Appointments             In 11 months Deirdre Evener, MD [facility]

## 2023-08-05 ENCOUNTER — Telehealth: Payer: Self-pay

## 2023-08-05 NOTE — Telephone Encounter (Signed)
Copied from CRM [id number]. Topic: General - Other >> Aug 04, 2023  2:28 PM Phill Myron wrote: Daughter [name] is calling and would like a call back regarding her mother's Lab results. Please advise. >> Aug 04, 2023  3:20 PM Teressa P wrote: Pt went to the ER via EMS.

## 2023-08-05 NOTE — Transitions of Care (Post Inpatient/ED Visit) (Unsigned)
   08/05/2023  Name: Molly Hodges MRN: [id number] DOB: [birth year]  Today's TOC FU Call Status: Today's TOC FU Call Status:: Unsuccessful Call (1st Attempt) Unsuccessful Call (1st Attempt) Date: 08/05/23  Attempted to reach the patient regarding the most recent Inpatient/ED visit.  Follow Up Plan: Additional outreach attempts will be made to reach the patient to complete the Transitions of Care (Post Inpatient/ED visit) call.   Signature Karena Addison, LPN [facility] Nurse Health Advisor Direct Dial [phone]

## 2023-08-06 NOTE — Telephone Encounter (Signed)
Labs from er show she is a little dehydrated and needs to drink more water. Otherwise normal.

## 2023-08-06 NOTE — Transitions of Care (Post Inpatient/ED Visit) (Signed)
08/06/2023  Name: Molly Hodges MRN: [id number] DOB: 1931/12/04  Today's TOC FU Call Status: Today's TOC FU Call Status:: Successful TOC FU Call Completed Unsuccessful Call (1st Attempt) Date: 08/05/23 [facility] FU Call Complete Date: 08/06/23 Patient's Name and Date of Birth confirmed.  Transition Care Management Follow-up Telephone Call Date of Discharge: 08/04/23 Discharge Facility: [facility]) Type of Discharge: Emergency Department Reason for ED Visit: Other: (afib) How have you been since you were released from the hospital?: Same Any questions or concerns?: No  Items Reviewed: Did you receive and understand the discharge instructions provided?: Yes Medications obtained,verified, and reconciled?: Yes (Medications Reviewed) Any new allergies since your discharge?: No Dietary orders reviewed?: Yes Do you have support at home?: Yes People in Home: spouse  Medications Reviewed Today: Medications Reviewed Today     Reviewed by Karena Addison, LPN (Licensed Practical Nurse) on 08/06/23 at 1248  Med List Status: <None>   Medication Order Taking? Sig Documenting Provider Last Dose Status Informant  Calcium Carbonate-Vitamin D 600-400 MG-UNIT tablet [id number] No Take 1 tablet by mouth daily. [provider] Taking Active Self  cholecalciferol (VITAMIN D) 1000 UNITS tablet [id number] No Take 1,000 Units by mouth daily. [provider] Taking Active Self  clopidogrel (PLAVIX) 75 MG tablet [id number] No TAKE 1 TABLET BY MOUTH DAILY Gollan, Tollie Pizza, MD Taking Active   diltiazem (CARDIZEM CD) 120 MG 24 hr capsule [id number] No Take 1 capsule (120 mg total) by mouth daily. Antonieta Iba, MD Taking Active   dorzolamide (TRUSOPT) 2 % ophthalmic solution [id number] No Place 1 drop into both eyes 2 (two) times daily.  [provider] Taking Active Self  ezetimibe (ZETIA) 10 MG tablet [id number] No TAKE 1 TABLET BY MOUTH DAILY Gollan,  Tollie Pizza, MD Taking Active   ipratropium-albuterol (DUONEB) 0.5-2.5 (3) MG/3ML SOLN [id number] No TAKE BY NEBULIZATION EVERY 4 HOURS AS NEEDED Malva Limes, MD Taking Active   isosorbide mononitrate (IMDUR) 30 MG 24 hr tablet [id number] No TAKE 1 TABLET BY MOUTH DAILY Gollan, Tollie Pizza, MD Taking Active   latanoprost (XALATAN) 0.005 % ophthalmic solution [id number] No Place 1 drop into both eyes at bedtime.  [provider] Taking Active Self  levalbuterol [facility] HFA) 45 MCG/ACT inhaler [id number] No Inhale 2 puffs into the lungs every 6 (six) hours as needed for wheezing or shortness of breath. Malva Limes, MD Taking Active Self  levofloxacin (LEVAQUIN) 500 MG tablet [id number]  Take 1 tablet (500 mg total) by mouth daily for 7 days. [name], FNP  Active   magnesium oxide (MAG-OX) 400 MG tablet [id number] No Take 400 mg by mouth daily. [provider] Taking Active Self  Multiple Vitamins-Minerals (PRESERVISION AREDS 2+MULTI VIT) CAPS [id number] No Take 2 capsules by mouth daily. [provider] Taking Active Self  nitroGLYCERIN (NITROSTAT) 0.4 MG SL tablet [id number] No Place 1 tablet (0.4 mg total) under the tongue every 5 (five) minutes as needed (for chest pain or shortness of breath). End, Cristal Deer, MD Taking Active Self  potassium chloride (KLOR-CON) 10 MEQ tablet [id number]  TAKE 2 TABLETS BY MOUTH DAILY. TAKE EXTRA 2 TABLETS WHEN TAKING EXTRA LASIX. Antonieta Iba, MD  Active   predniSONE (DELTASONE) 10 MG tablet [id number]  5 tablets for 2 days, then 4 for 2 days, then 3 for 2 days, then 2 for 2 days then 1 tablet daily Fisher, Demetrios Isaacs, MD  Active   pregabalin (LYRICA) 50 MG  capsule [id number]  TAKE 1 CAPSULE BY MOUTH 2 TIMES DAILY Malva Limes, MD  Active   rosuvastatin (CRESTOR) 20 MG tablet [id number] No TAKE 1 TABLET BY MOUTH DAILY Gollan, Tollie Pizza, MD Taking Active   Tiotropium Bromide-Olodaterol (STIOLTO RESPIMAT) 2.5-2.5 MCG/ACT AERS  [id number] No Inhale 2 puffs into the lungs daily. Clarisa Kindred A, FNP Taking Active Self  torsemide (DEMADEX) 20 MG tablet [id number] No TAKE 2 TABLETS BY MOUTH TWICE DAILY *DISCONTINUE FUROSEMIDE* Malva Limes, MD Taking Active   vitamin C (ASCORBIC ACID) 500 MG tablet [id number] No Take 500 mg by mouth daily. [provider] Taking Active Self  vitamin E 100 UNIT capsule [id number] No Take 200 Units by mouth daily. [provider] Taking Active Self  warfarin (COUMADIN) 2.5 MG tablet [id number] No TAKE 1 & 1/2 TABLETS DAILY EXCEPT TAKE ONE TABLET ON MONDAYS, WEDNESDAYS, AND FRIDAYS OR AS DIRECTED BY COUMADIN CLINIC Gollan, Tollie Pizza, MD Taking Active             Home Care and Equipment/Supplies: Were Home Health Services Ordered?: NA Any new equipment or medical supplies ordered?: NA  Functional Questionnaire: Do you need assistance with bathing/showering or dressing?: No Do you need assistance with meal preparation?: No Do you need assistance with eating?: No Do you have difficulty maintaining continence: No Do you need assistance with getting out of bed/getting out of a chair/moving?: No Do you have difficulty managing or taking your medications?: No  Follow up appointments reviewed: PCP Follow-up appointment confirmed?: No (declined) Specialist Hospital Follow-up appointment confirmed?: Yes Date of Specialist follow-up appointment?: 08/20/23 Follow-Up Specialty Provider:: gollan Do you need transportation to your follow-up appointment?: No Do you understand care options if your condition(s) worsen?: Yes-patient verbalized understanding    SIGNATURE Karena Addison, LPN [facility] Nurse Health Advisor Direct Dial [phone]

## 2023-08-07 ENCOUNTER — Ambulatory Visit: Payer: Medicare Other | Attending: Student | Admitting: Student

## 2023-08-07 ENCOUNTER — Encounter: Payer: Self-pay | Admitting: Student

## 2023-08-07 VITALS — BP 92/50 | HR 74 | Ht 65.0 in | Wt 122.4 lb

## 2023-08-07 DIAGNOSIS — I959 Hypotension, unspecified: Secondary | ICD-10-CM | POA: Diagnosis not present

## 2023-08-07 DIAGNOSIS — I480 Paroxysmal atrial fibrillation: Secondary | ICD-10-CM | POA: Diagnosis not present

## 2023-08-07 DIAGNOSIS — R0609 Other forms of dyspnea: Secondary | ICD-10-CM | POA: Diagnosis not present

## 2023-08-07 DIAGNOSIS — I2510 Atherosclerotic heart disease of native coronary artery without angina pectoris: Secondary | ICD-10-CM | POA: Diagnosis not present

## 2023-08-07 DIAGNOSIS — I5032 Chronic diastolic (congestive) heart failure: Secondary | ICD-10-CM

## 2023-08-07 DIAGNOSIS — I050 Rheumatic mitral stenosis: Secondary | ICD-10-CM

## 2023-08-07 MED ORDER — TORSEMIDE 20 MG PO TABS: 20.0000 mg | ORAL_TABLET | Freq: Every day | ORAL | 3 refills | Status: DC

## 2023-08-07 MED ORDER — TORSEMIDE 20 MG PO TABS: 20.0000 mg | ORAL_TABLET | Freq: Every day | ORAL | 5 refills | Status: DC

## 2023-08-07 MED ORDER — NITROGLYCERIN 0.4 MG SL SUBL: 0.4000 mg | SUBLINGUAL_TABLET | SUBLINGUAL | 3 refills | Status: AC | PRN

## 2023-08-07 NOTE — Patient Instructions (Addendum)
Medication Instructions:  Your physician recommends the following medication changes.  STOP TAKING: Isosorbide mononitrate (Imdur)  DECREASE: Torsemide 20 mg daily and you may take an additional 20 mg tablet if you have 3 or more pounds weight gain overnight OR if you have 5 or more pounds weight gain in 1 week  *If you need a refill on your cardiac medications before your next appointment, please call your pharmacy*   Lab Work: None ordered at this time    Testing/Procedures: Your physician has requested that you have an echocardiogram. Echocardiography is a painless test that uses sound waves to create images of your heart. It provides your doctor with information about the size and shape of your heart and how well your heart's chambers and valves are working.   You may receive an ultrasound enhancing agent through an IV if needed to better visualize your heart during the echo. This procedure takes approximately one hour.  There are no restrictions for this procedure.  This will take place at [address] [facility] [address], [place] [address]  Please note: We ask at that you not bring children with you during ultrasound (echo/ vascular) testing. Due to room size and safety concerns, children are not allowed in the ultrasound rooms during exams. Our front office staff cannot provide observation of children in our lobby area while testing is being conducted. An adult accompanying a patient to their appointment will only be allowed in the ultrasound room at the discretion of the ultrasound technician under special circumstances. We apologize for any inconvenience.    Follow-Up: At [facility], you and your health needs are our priority.  As part of our continuing mission to provide you with exceptional heart care, we have created designated Provider Care Teams.  These Care Teams include your primary Cardiologist (physician) and Advanced Practice Providers (APPs  -  Physician Assistants and Nurse Practitioners) who all work together to provide you with the care you need, when you need it.  We recommend signing up for the patient portal called "MyChart".  Sign up information is provided on this After Visit Summary.  MyChart is used to connect with patients for Virtual Visits (Telemedicine).  Patients are able to view lab/test results, encounter notes, upcoming appointments, etc.  Non-urgent messages can be sent to your provider as well.   To learn more about what you can do with MyChart, go to [email or web address].    Your next appointment:   3-4 week(s) after your echocardiogram   Provider:   You may see Julien Nordmann, MD or one of the following Advanced Practice Providers on your designated Care Team:   Nicolasa Ducking, NP Eula Listen, PA-C Cadence Fransico Michael, PA-C Charlsie Quest, NP Carlos Levering, NP

## 2023-08-07 NOTE — Telephone Encounter (Signed)
Patient notified. She wants to let us know she is seeing Cardiology today at 3pm.

## 2023-08-07 NOTE — Progress Notes (Signed)
Cardiology Clinic Note   Date: 08/07/2023 ID: Jazya, Cahall [birth year], MRN [id number]  Primary Cardiologist:  Julien Nordmann, MD  Patient Profile    Molly Hodges is a 89 y.o. female who presents to the clinic today for evaluation of DOE and weakness.     Past medical history significant for: CAD. R/LHC 05/17/2021: Severe single-vessel CAD with 80 to 90% proximal to mid LAD involving large D1.  Proximal D1 80%.  Mildly elevated right ventricular pressures.  Moderate to severely elevated PA pressure and elevated pulmonary vascular resistance.  Severe mitral valve stenosis with moderate MR. LHC 08/02/2021 (angina): 80 to 90% ostial to mid LAD and proximal to mid D1.  OCT PCI with DES 2.75 x 26, to mid LAD, DES 2.5 x 22 mm to D1. LHC 08/07/2021 (NSTEMI): D1 #1 20%, #2 previously treated.  Widely patent LAD/diagonal stents with no significant restenosis.  Mild ostial stenosis in D1 with some IVC appearance but does not appear to be flow-limiting. Chronic diastolic heart failure. Echo 10/31/2022: EF 60 to 65%.  No RWMA.  Indeterminate diastolic parameters.  Normal RV size/function.  Normal PA pressure.  RVSP 21.8 mmHg.  Mild to moderate MR.  Mild to moderate mitral stenosis, mean gradient 6 mmHg.  Mild AI.  Aortic valve sclerosis without stenosis. Mitral valve stenosis. TEE 06/13/2021: EF 55 to 60%.  No RWMA.  Normal RV size/function.  Degenerative mitral valve.  Moderate MR.  Moderate mitral stenosis, mean gradient 9 mmHg.  Negative bubble study.  Moderate LAE.  No LA/LAA thrombus.  Moderate (grade 3) atheroma plaque involving the ascending, transverse and ascending aorta. PAD. PAF. Onset January 2017 in the setting of sepsis (CAP and UTI). DCCV 11/15/2015. Carotid stenosis. Carotid duplex 12/23/2022: Right ICA 1 to 39%.  Left ICA 40 to 59%.  Disturbed flow left subclavian. Hypertension. Hyperlipidemia. COPD. Hypothyroidism.  In summary, patient was diagnosed with A-fib January  2017 in the setting of pneumonia and UTI.  Echo at that time showed normal LV function with moderately dilated left atrium.  She was anticoagulated with Eliquis and underwent cardioversion.  In 2022 she was experiencing some dizziness and underwent cardiac monitoring through PCP which showed predominantly sinus rhythm with first-degree AV block and rare PACs/PVCs.  She was referred to pulmonology in April 2022 for dyspnea.  Echo at that time showed EF 55 to 60%, grade 2 DD, normal RV function, moderate to severe mitral stenosis.  She underwent R/LHC in September 2022 which demonstrated severe mitral stenosis and moderate MR as well as severe single-vessel CAD in the LAD.  She was referred to CT surgery and underwent TEE which demonstrated moderate mitral stenosis with moderate MR.  Given these findings she was scheduled to undergo PCI of DES on 08/02/2021.  She tolerated the procedure well and was discharged home.  On 08/05/2021 she began experiencing DOE and intermittent wheezing.  She felt fatigued throughout the day with worsening dyspnea.  The next morning she continued to have dyspnea and was transported to [facility] ED by EMS.  Initial labs showed BNP 555, troponin 1505>> 1284, D-dimer 12.17.  EKG showed rate controlled A-fib without acute changes.  CTA was negative for PE however lower lobe mucous plugging was noted with peribronchial thickening and patchy groundglass density concerning for atelectasis versus mild edema versus combination of the 2.  She underwent LHC which showed patent stents (further details above).  Her A-fib has been managed with amiodarone and diltiazem with Coumadin for stroke prophylaxis.  Upon follow-up in 2023 it was discussed that she would be a high risk for recurrent arrhythmia secondary to mitral valve disease and dilated left atrium.  Amiodarone was stopped in May 2023.  Patient is asymptomatic and did not want to pursue further cardioversions     History of Present Illness     Molly Hodges is followed by Dr. Mariah Milling for the above outlined history.   Patient was last seen in the office by Dr. Mariah Milling on 01/07/2023 for routine follow-up.  She was doing well at that time.  She was noted to have low BP and diltiazem was decreased.  Patient was evaluated in the ED on 05/30/2023 for bleeding of the gums after dental cleaning.  Bleeding was controlled with TXA and gauze pressure.  INR was 3.3 and she was instructed to hold Coumadin and contact the Coumadin clinic.  Today, patient is accompanied by her daughter in law. She reports a 3 week history of increased dyspnea with minimal exertion and weakness. She reports she at the beginning of December she developed laryngitis with a sore throat and weakness. Her husband was also unwell. They were seen by urgent care and she was diagnosed with a viral illness. Her PCP called in some medications for her including prednisone and antibiotic.  She then developed a cough and shortness of breath and felt there could be something wrong with her lungs. It became so bad that she called EMS and was transported to the ED. She had a normal chest xray. BNP was elevated at 401 and she was instructed to follow up with cardiology. She reports continued weakness and dyspnea. She has been unable to sleep. She states she does not feel anxious but when she starts to fall asleep she startles awake. She denies feeling short of breath during these episodes. No lower extremity edema, abdominal bloating/fullness, or orthopnea. Daily weight has been stable. She has noted decreased BP. She finished course of prednisone 3 days ago. She is not having any chest pain, pressure or tightness.      ROS: All other systems reviewed and are otherwise negative except as noted in History of Present Illness.  Studies Reviewed    EKG Interpretation Date/Time:  Friday August 07 2023 15:09:51 EST Ventricular Rate:  74 PR Interval:    QRS Duration:  72 QT  Interval:  372 QTC Calculation: 412 R Axis:   64  Text Interpretation: Atrial fibrillation with a competing junctional pacemaker When compared with ECG of 04-Aug-2023 16:12, No significant change Confirmed by Carlos Levering [phone]) on 08/07/2023 3:16:14 PM   Risk Assessment/Calculations     CHA2DS2-VASc Score = 6   This indicates a 9.7% annual risk of stroke. The patient's score is based upon: CHF History: 1 HTN History: 1 Diabetes History: 0 Stroke History: 0 Vascular Disease History: 1 Age Score: 2 Gender Score: 1             Physical Exam    VS:  BP (!) 92/50 (BP Location: Left Arm, Patient Position: Sitting, Cuff Size: Normal)   Pulse 74   Ht 5\' 5"  (1.651 m)   Wt 122 lb 6 oz (55.5 kg)   SpO2 97%   BMI 20.36 kg/m  , BMI Body mass index is 20.36 kg/m.  GEN: Well nourished, well developed, in no acute distress. Neck: No JVD or carotid bruits. Cardiac: Irregular rhythm, regular rate. No murmurs. No rubs or gallops.   Respiratory:  Respirations regular and unlabored. Clear to auscultation  without rales, wheezing or rhonchi. GI: Soft, nontender, nondistended. Extremities: Radials/DP/PT 2+ and equal bilaterally. No clubbing or cyanosis. No edema.  Skin: Warm and dry, no rash. Neuro: Strength intact.  Assessment & Plan   CAD S/p PCI with DES to mid LAD and DES to D1 September 2022.  Patient denies chest pain, pressure or tightness.  -Continue Plavix, rosuvastatin, Zetia, as needed SL NTG.  Chronic diastolic heart failure Echo March 2024 showed normal LV/RV function, indeterminate diastolic parameters, normal PA pressure.  Patient reports dyspnea with minimal exertion for 3 weeks. BNP on 12/17 in the ED was 401. She denies lower extremity edema, abdominal bloating/fullness, orthopnea or PND. Daily weight has been stable. She has been taking torsemide 20 mg twice a day.  Euvolemic and well compensated on exam.  -Given low BP, decrease torsemide to 20 mg once a day.  Taken afternoon dose if weight increased >3 lb overnight or >5 lb in a week. -Stop isosorbide secondary to hypotension. -Update echo.  Mitral stenosis Echo March 2024 showed mild to moderate MR, mild to moderate mitral stenosis, mild AI.  Patient reports increased dyspnea with minimal exertion (see above). -Repeat echo.  PAF onset January 2017 in setting of sepsis.  DCCV March 2017.  Patient denies palpitations. EKG today shows afib, 74 bpm. I hesitate decreasing diltiazem further despite hypotension, as I want to ensure patient's rate is controlled. -Continue Coumadin, diltiazem.  Hypotension BP today 89/40 on intake and 92/50 on my recheck. She feels she is hydrating well. -Changes to torsemide and isosorbide as above.   Disposition: Echo. Stop isosorbide. Decrease torsemide to 20 mg daily with extra 20 mg prn weight gain. Return in 3-4 weeks or sooner as needed.          Signed, Etta Grandchild. Euva Rundell, DNP, NP-C

## 2023-08-08 ENCOUNTER — Encounter: Payer: Self-pay | Admitting: Family Medicine

## 2023-08-10 ENCOUNTER — Ambulatory Visit: Payer: Medicare Other | Admitting: Family Medicine

## 2023-08-18 ENCOUNTER — Ambulatory Visit: Payer: Medicare Other | Attending: Cardiovascular Disease

## 2023-08-18 DIAGNOSIS — I480 Paroxysmal atrial fibrillation: Secondary | ICD-10-CM | POA: Diagnosis not present

## 2023-08-18 DIAGNOSIS — I050 Rheumatic mitral stenosis: Secondary | ICD-10-CM

## 2023-08-18 DIAGNOSIS — Z5181 Encounter for therapeutic drug level monitoring: Secondary | ICD-10-CM

## 2023-08-18 LAB — POCT INR: INR: 1.7 — AB (ref 2.0–3.0)

## 2023-08-18 NOTE — Patient Instructions (Signed)
 TAKE 2 TABLETS TODAY ONLY THEN CONTINUE 1.5 tablets every day, EXCEPT 1 TABLET ON MONDAY, WEDNESDAY AND FRIDAY.  EAT 2 HELPINGS OF GREENS PER WEEK.   Recheck INR in 3 weeks;

## 2023-08-20 ENCOUNTER — Ambulatory Visit: Payer: Medicare Other | Admitting: Student

## 2023-09-03 ENCOUNTER — Ambulatory Visit: Payer: HMO | Attending: Student

## 2023-09-03 DIAGNOSIS — R0609 Other forms of dyspnea: Secondary | ICD-10-CM | POA: Diagnosis not present

## 2023-09-03 LAB — ECHOCARDIOGRAM COMPLETE
AV Mean grad: 5 mm[Hg]
AV Peak grad: 8.9 mm[Hg]
Ao pk vel: 1.49 m/s
Area-P 1/2: 1.31 cm2
S' Lateral: 3.8 cm

## 2023-09-04 ENCOUNTER — Other Ambulatory Visit: Payer: Self-pay | Admitting: Cardiovascular Disease

## 2023-09-04 DIAGNOSIS — E785 Hyperlipidemia, unspecified: Secondary | ICD-10-CM

## 2023-09-09 ENCOUNTER — Ambulatory Visit: Payer: HMO

## 2023-09-16 ENCOUNTER — Ambulatory Visit: Payer: HMO | Attending: Cardiovascular Disease

## 2023-09-16 DIAGNOSIS — Z5181 Encounter for therapeutic drug level monitoring: Secondary | ICD-10-CM | POA: Diagnosis not present

## 2023-09-16 DIAGNOSIS — I480 Paroxysmal atrial fibrillation: Secondary | ICD-10-CM

## 2023-09-16 DIAGNOSIS — I050 Rheumatic mitral stenosis: Secondary | ICD-10-CM | POA: Diagnosis not present

## 2023-09-16 LAB — POCT INR: INR: 3.4 — AB (ref 2.0–3.0)

## 2023-09-16 NOTE — Patient Instructions (Signed)
TAKE 0.5 TABLET TODAY ONLY THEN CONTINUE 1.5 tablets every day, EXCEPT 1 TABLET ON MONDAY, WEDNESDAY AND FRIDAY.  EAT 2 HELPINGS OF GREENS PER WEEK.   Recheck INR in 3 weeks;

## 2023-09-22 NOTE — Progress Notes (Signed)
 Cardiology Clinic Note   Date: 09/25/2023 ID: Aizza, Santiago July 25, 1932, MRN [id number]  Primary Cardiologist:  Evalene Lunger, MD  Chief Complaint   Molly Hodges is a 89 y.o. female who presents to the clinic today for follow up after testing.  Patient Profile   Molly Hodges is followed by Gollan for the history outlined below.      Past medical history significant for: CAD. R/LHC 05/17/2021: Severe single-vessel CAD with 80 to 90% proximal to mid LAD involving large D1.  Proximal D1 80%.  Mildly elevated right ventricular pressures.  Moderate to severely elevated PA pressure and elevated pulmonary vascular resistance.  Severe mitral valve stenosis with moderate MR. LHC 08/02/2021 (angina): 80 to 90% ostial to mid LAD and proximal to mid D1.  OCT PCI with DES 2.75 x 26, to mid LAD, DES 2.5 x 22 mm to D1. LHC 08/07/2021 (NSTEMI): D1 #1 20%, #2 previously treated.  Widely patent LAD/diagonal stents with no significant restenosis.  Mild ostial stenosis in D1 with some IVC appearance but does not appear to be flow-limiting. Chronic diastolic heart failure/mitral valve stenosis. Echo 09/03/2023: EF 55 to 60%.  No RWMA.  Indeterminate diastolic parameters.  Normal RV size/function.  Moderate LAE.  Mild to moderate MR.  Moderate to severe mitral stenosis mean gradient 10.5 mmHg.  Mild to moderate AI.  Aortic valve sclerosis without stenosis. PAD. PAF. Onset January 2017 in the setting of sepsis (CAP and UTI). DCCV 11/15/2015. Carotid stenosis. Carotid duplex 12/23/2022: Right ICA 1 to 39%.  Left ICA 40 to 59%.  Disturbed flow left subclavian. Hypertension. Hyperlipidemia. Asthma. Hypothyroidism.  In summary, patient was diagnosed with A-fib January 2017 in the setting of pneumonia and UTI.  Echo at that time showed normal LV function with moderately dilated left atrium.  She was anticoagulated with Eliquis  and underwent cardioversion.  In 2022 she was experiencing some  dizziness and underwent cardiac monitoring through PCP which showed predominantly sinus rhythm with first-degree AV block and rare PACs/PVCs.  She was referred to pulmonology in April 2022 for dyspnea.  Echo at that time showed EF 55 to 60%, grade 2 DD, normal RV function, moderate to severe mitral stenosis.  She underwent R/LHC in September 2022 which demonstrated severe mitral stenosis and moderate MR as well as severe single-vessel CAD in the LAD.  She was referred to CT surgery and underwent TEE which demonstrated moderate mitral stenosis with moderate MR.  Given these findings she was scheduled to undergo PCI of DES on 08/02/2021.  She tolerated the procedure well and was discharged home.  On 08/05/2021 she began experiencing DOE and intermittent wheezing.  She felt fatigued throughout the day with worsening dyspnea.  The next morning she continued to have dyspnea and was transported to [facility] ED by EMS.  Initial labs showed BNP 555, troponin 1505>> 1284, D-dimer 12.17.  EKG showed rate controlled A-fib without acute changes.  CTA was negative for PE however lower lobe mucous plugging was noted with peribronchial thickening and patchy groundglass density concerning for atelectasis versus mild edema versus combination of the 2.  She underwent LHC which showed patent stents (further details above).  Her A-fib has been managed with amiodarone  and diltiazem  with Coumadin  for stroke prophylaxis.  Upon follow-up in 2023 it was discussed that she would be a high risk for recurrent arrhythmia secondary to mitral valve disease and dilated left atrium.  Amiodarone  was stopped in May 2023.  Patient is asymptomatic and did  not want to pursue further cardioversions   Patient was last seen in the office by me on 08/07/2023 for evaluation of DOE and weakness.  Patient reported a 3-week history of increased dyspnea with minimal exertion and associated weakness.  She noted she had her husband had been sick at the beginning of  December.  She had been diagnosed with a viral illness by urgent care.  Her PCP then called in medications including prednisone  and an antibiotic.  She developed a cough and shortness of breath prompting ED evaluation.  Her BNP was elevated to 401.  She did not lower extremity edema, abdominal bloating orthopnea.  Her daily weight had been stable.  She was euvolemic at the time of her visit.  She noted low BP at home and BP at the time of her visit was 89/40 on intake and 92/50 on my recheck.  Isosorbide  was stopped and torsemide  was decreased with instructions to take extra dose with increased weight.  Repeat echo showed mild to moderate MR with moderate to severe mitral stenosis as detailed above.     History of Present Illness    Today, patient is accompanied by her daughter. She reports today she is doing well. She states yesterday she had a rough day with increased shortness of breath. She uses albuterol  as needed as well as nebulizer treatments. Albuterol  makes her jittery. She denies lower extremity edema or abdominal bloating/fullness. Home weight has been stable. She reports overall she is able to do her normal activities as long as she paces herself. Taking a shower does cause a lot of dyspnea and she normally sticks to sink baths. She has been evaluated by pulmonology in the past (last visit November 2023) and was told she has cardiac asthma. Patient states BP has improved with readings ~120/60 at home since medication changes in December. Discussed the results of recent echo in detail and all questions answered.     ROS: All other systems reviewed and are otherwise negative except as noted in History of Present Illness.  EKGs/Labs Reviewed    EKG Interpretation Date/Time:  Friday September 25 2023 10:46:57 EST Ventricular Rate:  64 PR Interval:    QRS Duration:  68 QT Interval:  414 QTC Calculation: 427 R Axis:   47  Text Interpretation: Atrial fibrillation Low voltage QRS When  compared with ECG of 07-Aug-2023 15:09, T wave inversion no longer evident in Lateral leads Confirmed by Loistine Sober [phone]) on 09/25/2023 10:59:10 AM   08/03/2023: ALT 17; AST 20 08/04/2023: BUN 33; Creatinine, Ser 1.26; Potassium 4.2; Sodium 138   08/04/2023: Hemoglobin 11.9; WBC 8.1   11/07/2022: TSH 3.430   08/04/2023: B Natriuretic Peptide 401.0   Risk Assessment/Calculations     CHA2DS2-VASc Score = 6   This indicates a 9.7% annual risk of stroke. The patient's score is based upon: CHF History: 1 HTN History: 1 Diabetes History: 0 Stroke History: 0 Vascular Disease History: 1 Age Score: 2 Gender Score: 1             Physical Exam    VS:  BP 120/60 (BP Location: Left Arm, Patient Position: Sitting, Cuff Size: Normal)   Pulse 64   Ht 5' 5 (1.651 m)   Wt 124 lb 2 oz (56.3 kg)   BMI 20.66 kg/m  , BMI Body mass index is 20.66 kg/m.  GEN: Well nourished, well developed, in no acute distress. Neck: No JVD or carotid bruits. Cardiac: Irregular rhythm, regular rate. No murmurs. No  rubs or gallops.   Respiratory:  Respirations regular and unlabored. Clear to auscultation without rales, wheezing or rhonchi. GI: Soft, nontender, nondistended. Extremities: Radials/DP/PT 2+ and equal bilaterally. No clubbing or cyanosis. No edema.  Skin: Warm and dry, no rash. Neuro: Strength intact.  Assessment & Plan   CAD S/p PCI with DES to mid LAD and DES to D1 September 2022.  Patient denies chest pain, pressure or tightness. She is able to do her normal home activities with good tolerance.  -Continue Plavix , rosuvastatin , Zetia , as needed SL NTG.   Chronic diastolic heart failure/mitral stenosis Echo January 2025 showed normal LV/RV function, indeterminate diastolic parameters, moderate LAE, mild to moderate MR, moderate to severe mitral stenosis, mild to moderate AI.  Patient reports increased shortness of breath yesterday but doing fine today. No lower extremity edema or  abdominal bloating. Home weight has been stable. She is able to do all of her normal activities by pacing herself. Euvolemic and well compensated on exam. Discussed worsening mitral stenosis contributing to dyspnea. Discussed referral to structural heart team. She would prefer to see pulmonology again to see if there is a better inhaler for her to use to improve her breathing.  -Continue torsemide .   -Refer to pulmonology for history of asthma.  -Consider referral to structural heart team.    Permanent Afib Onset January 2017 in setting of sepsis.  DCCV March 2017.  Patient denies palpitations. She is able to complete home activities with occasional rest breaks. EKG today shows Afib 64 bpm.  -Continue Coumadin , diltiazem . -CBC and BMP today.    Hypertension BP today 120/60. Patient reports this is how it has been running at home since stopping isosorbide  and decreasing torsemide .  -Continue diltiazem .  Disposition: CBC and BMP today. Refer to pulmonology. Return in 6 months or sooner as needed.          Signed, Barnie HERO. Rector Devonshire, DNP, NP-C

## 2023-09-25 ENCOUNTER — Ambulatory Visit: Payer: PPO | Attending: Student | Admitting: Student

## 2023-09-25 ENCOUNTER — Encounter: Payer: Self-pay | Admitting: Student

## 2023-09-25 VITALS — BP 120/60 | HR 64 | Ht 65.0 in | Wt 124.1 lb

## 2023-09-25 DIAGNOSIS — I2510 Atherosclerotic heart disease of native coronary artery without angina pectoris: Secondary | ICD-10-CM | POA: Diagnosis not present

## 2023-09-25 DIAGNOSIS — J45909 Unspecified asthma, uncomplicated: Secondary | ICD-10-CM | POA: Diagnosis not present

## 2023-09-25 DIAGNOSIS — Z79899 Other long term (current) drug therapy: Secondary | ICD-10-CM

## 2023-09-25 DIAGNOSIS — I10 Essential (primary) hypertension: Secondary | ICD-10-CM | POA: Diagnosis not present

## 2023-09-25 DIAGNOSIS — I5032 Chronic diastolic (congestive) heart failure: Secondary | ICD-10-CM

## 2023-09-25 DIAGNOSIS — I050 Rheumatic mitral stenosis: Secondary | ICD-10-CM | POA: Diagnosis not present

## 2023-09-25 DIAGNOSIS — I4821 Permanent atrial fibrillation: Secondary | ICD-10-CM

## 2023-09-25 DIAGNOSIS — R0609 Other forms of dyspnea: Secondary | ICD-10-CM | POA: Diagnosis not present

## 2023-09-25 NOTE — Patient Instructions (Signed)
 Medication Instructions:  Your Physician recommend you continue on your current medication as directed.    *If you need a refill on your cardiac medications before your next appointment, please call your pharmacy*   Lab Work: Your provider would like for you to have following labs drawn today BMeT and CBC.   If you have labs (blood work) drawn today and your tests are completely normal, you will receive your results only by: MyChart Message (if you have MyChart) OR A paper copy in the mail If you have any lab test that is abnormal or we need to change your treatment, we will call you to review the results.    Follow-Up: At [facility], you and your health needs are our priority.  As part of our continuing mission to provide you with exceptional heart care, we have created designated Provider Care Teams.  These Care Teams include your primary Cardiologist (physician) and Advanced Practice Providers (APPs -  Physician Assistants and Nurse Practitioners) who all work together to provide you with the care you need, when you need it.    Your next appointment:   6 month(s)  Provider:   You may see Timothy Gollan, MD or one of the following Advanced Practice Providers on your designated Care Team:   Lonni Meager, NP Bernardino Bring, PA-C Cadence Franchester, PA-C Tylene Lunch, NP Barnie Hila, NP

## 2023-09-26 LAB — BASIC METABOLIC PANEL
BUN/Creatinine Ratio: 21 (ref 12–28)
BUN: 17 mg/dL (ref 8–27)
CO2: 21 mmol/L (ref 20–29)
Calcium: 8.8 mg/dL (ref 8.7–10.3)
Chloride: 104 mmol/L (ref 96–106)
Creatinine, Ser: 0.82 mg/dL (ref 0.57–1.00)
Glucose: 74 mg/dL (ref 70–99)
Potassium: 4.4 mmol/L (ref 3.5–5.2)
Sodium: 144 mmol/L (ref 134–144)
eGFR: 68 mL/min/{1.73_m2} (ref >59)

## 2023-09-26 LAB — CBC
Hematocrit: 40.4 % (ref 34.0–46.6)
Hemoglobin: 12.2 g/dL (ref 11.1–15.9)
MCH: 22.6 pg — ABNORMAL LOW (ref 26.6–33.0)
MCHC: 30.2 g/dL — ABNORMAL LOW (ref 31.5–35.7)
MCV: 75 fL — ABNORMAL LOW (ref 79–97)
Platelets: 241 10*3/uL (ref 150–450)
RBC: 5.4 x10E6/uL — ABNORMAL HIGH (ref 3.77–5.28)
RDW: 16.4 % — ABNORMAL HIGH (ref 11.7–15.4)
WBC: 8.2 10*3/uL (ref 3.4–10.8)

## 2023-09-28 ENCOUNTER — Encounter: Payer: Self-pay | Admitting: Internal Medicine

## 2023-09-28 ENCOUNTER — Ambulatory Visit: Payer: PPO | Admitting: Internal Medicine

## 2023-09-28 VITALS — BP 120/60 | HR 78 | Temp 97.6°F | Ht 65.0 in | Wt 123.4 lb

## 2023-09-28 DIAGNOSIS — J449 Chronic obstructive pulmonary disease, unspecified: Secondary | ICD-10-CM | POA: Diagnosis not present

## 2023-09-28 DIAGNOSIS — R0602 Shortness of breath: Secondary | ICD-10-CM

## 2023-09-28 MED ORDER — BUDESONIDE 0.5 MG/2ML IN SUSP: 0.5000 mg | Freq: Two times a day (BID) | RESPIRATORY_TRACT | 0 refills | Status: DC

## 2023-09-28 MED ORDER — ARFORMOTEROL TARTRATE 15 MCG/2ML IN NEBU: 15.0000 ug | INHALATION_SOLUTION | Freq: Two times a day (BID) | RESPIRATORY_TRACT | 5 refills | Status: DC

## 2023-09-28 NOTE — Progress Notes (Addendum)
[facility] Consultation      Date: 09/28/2023,   MRN# [id number] Molly Hodges [birth year]    CHIEF COMPLAINT:   Assessment of asthma   HISTORY OF PRESENT ILLNESS   89 year old pleasant white female seen today for assessment of shortness of breath Patient has not had childhood asthma She states she was will be it was well-controlled for many years Patient states she used albuterol as needed However as a child she missed many days of school due to her asthma  Over the last several months she seems that her asthma is worsening She uses DuoNebs a.m. and p.m. and seems to be helping However throughout the day she has progressive shortness of breath  Patient also does have severe mitral valve disease which was recently seen on echocardiogram Patient sees cardiology and uses diuretics as needed  No exacerbation at this time No evidence of heart failure at this time No evidence or signs of infection at this time No respiratory distress No fevers, chills, nausea, vomiting, diarrhea No evidence of lower extremity edema No evidence hemoptysis  Previous assessments have revealed that she has an nocturnal hypoxia and removed oxygen AGAINST MEDICAL ADVICE She has progressive mitral valve disease based on echocardiogram evidence Patient uses diuretics as needed Sees cardiology  Ambulating pulse oximetry in the office showed significant hypoxia down to the 60s with increased work of breathing and shortness of breath Patient immediately placed on 10 L of oxygen and weaned down to 3 L of oxygen Patient will need oxygen for survival   SATURATION QUALIFICATIONS: (This note is used to comply with regulatory documentation for home oxygen)   Patient Saturations on Room Air at Rest = 99%   Patient Saturations on Room Air while Ambulating = 83%   Patient Saturations on 2 Liters of oxygen while Ambulating = 96%   Please briefly explain why patient needs home  oxygen:Does not use O2 at night anymore [id number]  PAST MEDICAL HISTORY   Past Medical History:  Diagnosis Date   Aortic atherosclerosis (HCC)    a. 05/2021 TEE: GrIII atheroma plaque involving the asc, transverse, and desc Ao.   Asthma    CAD (coronary artery disease)    a. 04/2021 Cath: LM nl, LAD 85p/m, D1 80, RI nl, LCX nl, RCA nl; b. 07/2021 PCI: pLAD (2.75x26 Onyx Frontier DES), D1 (2.5x22 Onyx Frontier DES).   Carotid artery disease (HCC)    a. s/p R carotid stenting (9mm x 7mm x 4cm long Exact stent); b. 06/2021 U/S: RICA 40-59%, LICA 40-59%.   CHF (congestive heart failure) (HCC)    Community acquired pneumonia    Essential hypertension    Glaucoma    Hyperlipidemia    Mitral regurgitation    a. TTE 08/2015: EF 60-65%, normal wall motion, mild MR, mildly dilated left atrium measuring 40 mm, RVSF normal, PASP normal; b. 05/2021 TEE: Moderate MR.   Mitral stenosis    a. 05/2021 L/RHC: Sev MS w/ mean grad 13-4mmHg and MV area of 0.5-06.cm^2; b. 05/2021 TEE: EF 55-60%, no rwma, nl RV fxn, mod MR, mod MS (MV area by P1/2t: 1.61 cm^2 w/ mean grad of ).   Peripheral neuropathy    Persistent atrial fibrillation (HCC) 09/17/2015   a. s/p DCCV 11/15/2015; b. CHADS2VASc => 4 (HTN, age x 2, female)--> warfarin; c. 06/2021 recurrent afib-->amio added.   Squamous cell carcinoma of skin 12/18/2021   R dorsum hand, EDC   Squamous cell carcinoma of skin 12/31/2021  R hand dorsum, recurrent - excised 02/04/2022   Squamous cell carcinoma of skin 12/31/2021   L forearm - ED&C   Squamous cell carcinoma of skin 06/24/2022   R thumb webspace with wart virus - ED&C   Squamous cell carcinoma of skin 10/20/2022   L lat knee - tx with ED&C     SURGICAL HISTORY   Past Surgical History:  Procedure Laterality Date   ABDOMINAL HYSTERECTOMY  1987   due to heavy bleeding   APPENDECTOMY     CARDIAC CATHETERIZATION     CAROTID PTA/STENT INTERVENTION Right 08/15/2020   Procedure: CAROTID  PTA/STENT INTERVENTION;  Surgeon: Annice Needy, MD;  Location: [facility] INVASIVE CV LAB;  Service: Cardiovascular;  Laterality: Right;   CATARACT EXTRACTION     CORONARY IMAGING/OCT N/A 08/02/2021   Procedure: INTRAVASCULAR IMAGING/OCT;  Surgeon: Yvonne Kendall, MD;  Location: [facility];  Service: Cardiovascular;  Laterality: N/A;   CORONARY STENT INTERVENTION N/A 08/02/2021   Procedure: CORONARY STENT INTERVENTION;  Surgeon: Yvonne Kendall, MD;  Location: [facility];  Service: Cardiovascular;  Laterality: N/A;   ELECTROPHYSIOLOGIC STUDY N/A 11/15/2015   Procedure: CARDIOVERSION;  Surgeon: Antonieta Iba, MD;  Location: [facility] ORS;  Service: Cardiovascular;  Laterality: N/A;   LEFT HEART CATH AND CORONARY ANGIOGRAPHY N/A 08/02/2021   Procedure: LEFT HEART CATH AND CORONARY ANGIOGRAPHY;  Surgeon: Yvonne Kendall, MD;  Location: [facility];  Service: Cardiovascular;  Laterality: N/A;   LEFT HEART CATH AND CORONARY ANGIOGRAPHY N/A 08/07/2021   Procedure: LEFT HEART CATH AND CORONARY ANGIOGRAPHY;  Surgeon: Iran Ouch, MD;  Location: [facility] INV[facility]IVE CV LAB;  Service: Cardiovascular;  Laterality: N/A;   RIGHT/LEFT HEART CATH AND CORONARY ANGIOGRAPHY N/A 05/17/2021   Procedure: RIGHT/LEFT HEART CATH AND CORONARY ANGIOGRAPHY;  Surgeon: Yvonne Kendall, MD;  Location: [facility] INVASIVE CV LAB;  Service: Cardiovascular;  Laterality: N/A;   TEE WITHOUT CARDIOVERSION N/A 06/13/2021   Procedure: TRANSESOPHAGEAL ECHOCARDIOGRAM (TEE);  Surgeon: Antonieta Iba, MD;  Location: [facility] ORS;  Service: Cardiovascular;  Laterality: N/A;     FAMILY HISTORY   Family History  Problem Relation Age of Onset   CAD Mother    CAD Father    Cancer Son 25       lung cancer     SOCIAL HISTORY   Social History   Tobacco Use   Smoking status: Never   Smokeless tobacco: Never   Tobacco comments:    never   Vaping Use   Vaping status: Never Used  Substance Use Topics   Alcohol use:  No   Drug use: Never     MEDICATIONS    Home Medication:  Current Outpatient Rx   Order #: [id number] Class: Historical Med   Order #: [id number] Class: Historical Med   Order #: [id number] Class: Normal   Order #: [id number] Class: Normal   Order #: [id number] Class: Historical Med   Order #: [id number] Class: Normal   Order #: [id number] Class: Normal   Order #: [id number] Class: Historical Med   Order #: [id number] Class: Normal   Order #: [id number] Class: Historical Med   Order #: [id number] Class: Historical Med   Order #: [id number] Class: Normal   Order #: [id number] Class: Normal   Order #: [id number] Class: Normal   Order #: [id number] Class: Normal   Order #: [id number] Class: Normal   Order #: [id number] Class: Normal   Order #: [id number] Class: Historical Med   Order #: [id number] Class: Historical Med   Order #: [id number] Class: Normal    Current Medication:  Current Outpatient  Medications:    Calcium Carbonate-Vitamin D 600-400 MG-UNIT tablet, Take 1 tablet by mouth daily., Disp: , Rfl:    cholecalciferol (VITAMIN D) 1000 UNITS tablet, Take 1,000 Units by mouth daily., Disp: , Rfl:    clopidogrel (PLAVIX) 75 MG tablet, TAKE 1 TABLET BY MOUTH DAILY, Disp: 90 tablet, Rfl: 2   diltiazem (CARDIZEM CD) 120 MG 24 hr capsule, Take 1 capsule (120 mg total) by mouth daily., Disp: 90 capsule, Rfl: 3   dorzolamide (TRUSOPT) 2 % ophthalmic solution, Place 1 drop into both eyes 2 (two) times daily. , Disp: , Rfl:    ezetimibe (ZETIA) 10 MG tablet, TAKE 1 TABLET BY MOUTH DAILY, Disp: 90 tablet, Rfl: 2   ipratropium-albuterol (DUONEB) 0.5-2.5 (3) MG/3ML SOLN, TAKE BY NEBULIZATION EVERY 4 HOURS AS NEEDED, Disp: 360 mL, Rfl: 1   latanoprost (XALATAN) 0.005 % ophthalmic solution, Place 1 drop into both eyes at bedtime. , Disp: , Rfl:    levalbuterol (XOPENEX HFA) 45 MCG/ACT inhaler, Inhale 2 puffs into the lungs every 6 (six) hours as needed for wheezing or shortness of breath., Disp: 1 each, Rfl: 3    magnesium oxide (MAG-OX) 400 MG tablet, Take 400 mg by mouth daily., Disp: , Rfl:    Multiple Vitamins-Minerals (PRESERVISION AREDS 2+MULTI VIT) CAPS, Take 2 capsules by mouth daily., Disp: , Rfl:    nitroGLYCERIN (NITROSTAT) 0.4 MG SL tablet, Place 1 tablet (0.4 mg total) under the tongue every 5 (five) minutes as needed (for chest pain or shortness of breath)., Disp: 25 tablet, Rfl: 3   potassium chloride (KLOR-CON) 10 MEQ tablet, TAKE 2 TABLETS BY MOUTH DAILY. TAKE EXTRA 2 TABLETS WHEN TAKING EXTRA LASIX., Disp: 200 tablet, Rfl: 2   pregabalin (LYRICA) 50 MG capsule, TAKE 1 CAPSULE BY MOUTH 2 TIMES DAILY, Disp: 60 capsule, Rfl: 5   rosuvastatin (CRESTOR) 20 MG tablet, TAKE 1 TABLET BY MOUTH DAILY, Disp: 90 tablet, Rfl: 3   Tiotropium Bromide-Olodaterol (STIOLTO RESPIMAT) 2.5-2.5 MCG/ACT AERS, Inhale 2 puffs into the lungs daily., Disp: 1 each, Rfl: 5   torsemide (DEMADEX) 20 MG tablet, Take 1 tablet (20 mg total) by mouth daily. You may take additional dose (20 mg tab) for 3 pound gain overnight or 5 pound gain in 1 week., Disp: 135 tablet, Rfl: 3   vitamin C (ASCORBIC ACID) 500 MG tablet, Take 500 mg by mouth daily., Disp: , Rfl:    vitamin E 100 UNIT capsule, Take 200 Units by mouth daily., Disp: , Rfl:    warfarin (COUMADIN) 2.5 MG tablet, TAKE 1 & 1/2 TABLETS DAILY EXCEPT TAKE ONE TABLET ON MONDAYS, WEDNESDAYS, AND FRIDAYS OR AS DIRECTED BY COUMADIN CLINIC, Disp: 40 tablet, Rfl: 3    ALLERGIES   Shellfish allergy, Azithromycin, Tamiflu [oseltamivir phosphate], and Albuterol     REVIEW OF SYSTEMS    Review of Systems:  Gen:  Denies  fever, sweats, chills weigh loss  HEENT: Denies blurred vision, double vision, ear pain, eye pain, hearing loss, nose bleeds, sore throat Cardiac:  No dizziness, chest pain or heaviness, chest tightness,edema Resp:   Denies cough or sputum porduction, +shortness of breath,wheezing, hemoptysis,  Gi: Denies swallowing difficulty, stomach pain, nausea or  vomiting, diarrhea, constipation, bowel incontinence Gu:  Denies bladder incontinence, burning urine Ext:   Denies Joint pain, stiffness or swelling Skin: Denies  skin rash, easy bruising or bleeding or hives Endoc:  Denies polyuria, polydipsia , polyphagia or weight change Psych:   Denies depression, insomnia or hallucinations  Other:  All other systems negative   VS: Ht 5\' 5"  (1.651 m)   BMI 20.66 kg/m    BP 120/60 (BP Location: Left Arm, Patient Position: Sitting, Cuff Size: Normal)   Pulse 78   Temp 97.6 F (36.4 C) (Temporal)   Ht 5\' 5"  (1.651 m)   Wt 123 lb 6.4 oz (56 kg)   SpO2 100%   BMI 20.53 kg/m    PHYSICAL EXAM  General Appearance: No distress  EYES PERRLA, EOM intact.   NECK Supple, No JVD Pulmonary: normal breath sounds, No wheezing.  CardiovascularNormal S1,S2.  No m/r/g.   Abdomen: Benign, Soft, non-tender. Skin:   warm, no rashes, no ecchymosis  Extremities: normal, no cyanosis, clubbing. Neuro:without focal findings,  speech normal  PSYCHIATRIC: Mood, affect within normal limits.   ALL OTHER ROS ARE NEGATIVE      IMAGING    ECHOCARDIOGRAM COMPLETE Result Date: 09/03/2023    ECHOCARDIOGRAM REPORT   Patient Name:   ITZA MANIACI Date of Exam: 09/03/2023 Medical Rec #:  [id number]          Height:       65.0 in Accession #:    [id number]         Weight:       122.4 lb Date of Birth:  [birth year]           BSA:          1.605 m Patient Age:    89 years           BP:           92/50 mmHg Patient Gender: F                  HR:           71 bpm. Exam Location:  [facility] Procedure: 2D Echo, Cardiac Doppler, Color Doppler and Strain Analysis Indications:    R06.9 DOE  History:        Patient has prior history of Echocardiogram examinations, most                 recent 10/31/2022. CHF, CAD, COPD and PAD, Mitral Valve Disease,                 Arrythmias:PVC, Signs/Symptoms:Shortness of Breath and Dyspnea;                 Risk Factors:Hypertension and  Non-Smoker.  Sonographer:    Ilda Mori MHA, BS, RDCS Referring Phys: [id number] [facility]  Sonographer Comments: Signifigant increase in MV gradient and LA volume from prior exam 10/31/22 IMPRESSIONS  1. Left ventricular ejection fraction, by estimation, is 55 to 60%. The left ventricle has normal function. The left ventricle has no regional wall motion abnormalities. Left ventricular diastolic parameters are indeterminate. The average left ventricular global longitudinal strain is -14.5 %.  2. Right ventricular systolic function is normal. The right ventricular size is normal.  3. Left atrial size was moderately dilated.  4. The mitral valve is degenerative. Mild to moderate mitral valve regurgitation. Moderate to severe mitral stenosis. The mean mitral valve gradient is 10.5 mmHg.  5. The aortic valve is normal in structure. There is mild calcification of the aortic valve. Aortic valve regurgitation is mild to moderate. Aortic valve sclerosis is present, with no evidence of aortic valve stenosis. Aortic valve mean gradient measures 5.0 mmHg.  6. The inferior vena cava is normal in size with greater than 50% respiratory variability, suggesting  right atrial pressure of 3 mmHg. FINDINGS  Left Ventricle: Left ventricular ejection fraction, by estimation, is 55 to 60%. The left ventricle has normal function. The left ventricle has no regional wall motion abnormalities. The average left ventricular global longitudinal strain is -14.5 %. The left ventricular internal cavity size was normal in size. There is no left ventricular hypertrophy. Left ventricular diastolic parameters are indeterminate. Right Ventricle: The right ventricular size is normal. No increase in right ventricular wall thickness. Right ventricular systolic function is normal. Left Atrium: Left atrial size was moderately dilated. Right Atrium: Right atrial size was normal in size. Pericardium: There is no evidence of pericardial effusion.  Mitral Valve: The mitral valve is degenerative in appearance. There is moderate thickening of the mitral valve leaflet(s). There is moderate calcification of the mitral valve leaflet(s). Severely decreased mobility of the mitral valve leaflets. Mild to moderate mitral valve regurgitation. Moderate to severe mitral valve stenosis. MV peak gradient, 27.9 mmHg. The mean mitral valve gradient is 10.5 mmHg. Tricuspid Valve: The tricuspid valve is normal in structure. Tricuspid valve regurgitation is mild . No evidence of tricuspid stenosis. Aortic Valve: The aortic valve is normal in structure. There is mild calcification of the aortic valve. Aortic valve regurgitation is mild to moderate. Aortic valve sclerosis is present, with no evidence of aortic valve stenosis. Aortic valve mean gradient measures 5.0 mmHg. Aortic valve peak gradient measures 8.9 mmHg. Pulmonic Valve: The pulmonic valve was normal in structure. Pulmonic valve regurgitation is mild. No evidence of pulmonic stenosis. Aorta: The aortic root is normal in size and structure. Venous: The inferior vena cava is normal in size with greater than 50% respiratory variability, suggesting right atrial pressure of 3 mmHg. IAS/Shunts: No atrial level shunt detected by color flow Doppler.  LEFT VENTRICLE PLAX 2D LVIDd:         4.30 cm LVIDs:         3.80 cm 2D Longitudinal Strain LV PW:         0.80 cm 2D Strain GLS Avg:     -14.5 % LV IVS:        0.80 cm  RIGHT VENTRICLE             IVC RV Basal diam:  2.60 cm     IVC diam: 2.40 cm RV S prime:     10.20 cm/s TAPSE (M-mode): 1.5 cm LEFT ATRIUM              Index        RIGHT ATRIUM           Index LA diam:        5.00 cm  3.11 cm/m   RA Area:     19.00 cm LA Vol (A2C):   119.0 ml 74.12 ml/m  RA Volume:   41.30 ml  25.73 ml/m LA Vol (A4C):   73.4 ml  45.72 ml/m LA Biplane Vol: 93.3 ml  58.12 ml/m  AORTIC VALVE AV Vmax:           149.00 cm/s AV Vmean:          103.000 cm/s AV VTI:            0.320 m AV Peak Grad:       8.9 mmHg AV Mean Grad:      5.0 mmHg LVOT Vmax:         80.90 cm/s LVOT Vmean:        54.600 cm/s LVOT VTI:  0.178 m LVOT/AV VTI ratio: 0.56  AORTA Ao Root diam: 2.90 cm Ao Asc diam:  2.90 cm MITRAL VALVE MV Area (PHT): 1.31 cm    SHUNTS MV Peak grad:  27.9 mmHg   Systemic VTI: 0.18 m MV Mean grad:  10.5 mmHg MV Vmax:       2.64 m/s MV Vmean:      147.5 cm/s Julien Nordmann MD Electronically signed by Julien Nordmann MD Signature Date/Time: 09/03/2023/5:17:37 PM    Final       ASSESSMENT/PLAN   89 year old pleasant white female seen today for ongoing symptoms of shortness of breath and dyspnea on exertion likely related to underlying asthma and COPD based on previous spirometry findings 7 years ago.  It seems as though patient may not have the respiratory capacity to take traditional inhaler therapy therefore will need to provide alternative means for maintenance therapy with nebulized medications  Progressive shortness of breath and dyspnea exertion hypoxia Recommend starting oxygen therapy assess possible Assess for nocturnal hypoxia with overnight pulse oximetry Patient has been prescribed oxygen therapy in the past at night  History of asthma and COPD At this point patient would like to try anything that would help with shortness of breath therefore we will try Pulmicort nebulizers and Brovana nebulizers to see if this would make a difference in her respiratory symptoms Patient would not be able to tolerate traditional inhaler therapy at this time     SATURATION QUALIFICATIONS: (This note is used to comply with regulatory documentation for home oxygen)  Patient Saturations on Room Air at Rest = 99%  Patient Saturations on Room Air while Ambulating = 83%  Patient Saturations on 2 Liters of oxygen while Ambulating = 96%  Please briefly explain why patient needs home oxygen:Does not use O2 at night anymore [id number]    MEDICATION ADJUSTMENTS/LABS AND TESTS  ORDERED: Recommend starting Pulmicort nebulizers twice daily Recommend starting Brovana nebulizers twice daily Recommend starting oxygen therapy as soon as possible with exertion Use DuoNebs every 4 hours as needed throughout the day Check overnight pulse oximetry to assess for hypoxia Avoid Allergens and Irritants Avoid secondhand smoke Avoid SICK contacts Recommend  Masking  when appropriate Recommend Keep up-to-date with vaccinations   CURRENT MEDICATIONS REVIEWED AT LENGTH WITH PATIENT TODAY   Patient  satisfied with Plan of action and management. All questions answered  Follow up 4 weeks   I spent a total of 55 minutes reviewing chart data, face-to-face evaluation with the patient, counseling and coordination of care as detailed above.     Lucie Leather, M.D.  [facility]  Medical Director [facility] Medical Director [facility] Cardio-Pulmonary Department

## 2023-09-28 NOTE — Patient Instructions (Addendum)
 Recommend obtaining oxygen  test overnight assess oxygen  levels  Recommend starting Pulmicort  nebulizers twice daily Recommend starting Brovana  nebulizers twice daily  Use DuoNebs every 4 hours as needed throughout the day  Avoid Allergens and Irritants Avoid secondhand smoke Avoid SICK contacts Recommend  Masking  when appropriate Recommend Keep up-to-date with vaccinations

## 2023-09-29 ENCOUNTER — Other Ambulatory Visit (HOSPITAL_COMMUNITY): Payer: Self-pay

## 2023-09-29 ENCOUNTER — Telehealth: Payer: Self-pay

## 2023-09-29 NOTE — Telephone Encounter (Signed)
*  Pulm  Pharmacy Patient Advocate Encounter   Received notification from CoverMyMeds that prior authorization for Arformoterol Tartrate 15MCG/2ML nebulizer solution  is required/requested.   Insurance verification completed.   The patient is insured through [facility] ADVANTAGE/RX ADVANCE .   Per test claim: PA required; PA submitted to above mentioned insurance via CoverMyMeds Key/confirmation #/EOC [id number] Status is pending

## 2023-09-29 NOTE — Telephone Encounter (Signed)
Pharmacy Patient Advocate Encounter  Received notification from [facility] ADVANTAGE/RX ADVANCE that Prior Authorization for Arformoterol Tartrate 15MCG/2ML nebulizer solution  has been APPROVED from 09/29/2023 to 09/28/2024. Unable to obtain price due to refill too soon rejection, last fill date 09/29/2023 next available fill date03/01/2025

## 2023-09-30 ENCOUNTER — Encounter: Payer: Self-pay | Admitting: Internal Medicine

## 2023-09-30 NOTE — Telephone Encounter (Signed)
I spoke with the patient. She said she used the last night and it caused her to itch and kept her up last night. She said she does not sleep well at night always, but seemed like something was agitating her. She said the itching has stopped but this could be because she was having a bad night. She also was confused on how to take the new medications.  Per Dr. Clovis Fredrickson last office visit-   Recommend starting Pulmicort nebulizers twice daily Recommend starting Brovana nebulizers twice daily Recommend starting oxygen therapy as soon as possible with exertion Use DuoNebs every 4 hours as needed throughout the day  I have notified the patient. I told her I would send the message to Dr. Belia Heman about her itching and see if he wanted to change the medications. She said she wanted to try and take it again and see if it still caused the itching. She said she spent $84 on the medication. She will let us know if the itching continues.  Nothing further needed.

## 2023-10-02 DIAGNOSIS — J449 Chronic obstructive pulmonary disease, unspecified: Secondary | ICD-10-CM | POA: Diagnosis not present

## 2023-10-07 ENCOUNTER — Ambulatory Visit: Payer: PPO

## 2023-10-14 ENCOUNTER — Ambulatory Visit: Payer: PPO | Attending: Cardiovascular Disease

## 2023-10-14 DIAGNOSIS — Z5181 Encounter for therapeutic drug level monitoring: Secondary | ICD-10-CM | POA: Diagnosis not present

## 2023-10-14 DIAGNOSIS — I480 Paroxysmal atrial fibrillation: Secondary | ICD-10-CM

## 2023-10-14 DIAGNOSIS — I050 Rheumatic mitral stenosis: Secondary | ICD-10-CM | POA: Diagnosis not present

## 2023-10-14 LAB — POCT INR: INR: 2.9 (ref 2.0–3.0)

## 2023-10-14 NOTE — Patient Instructions (Signed)
 CONTINUE 1.5 tablets every day, EXCEPT 1 TABLET ON MONDAY, WEDNESDAY AND FRIDAY.  EAT 2 HELPINGS OF GREENS PER WEEK.   Recheck INR in 6 weeks;  [phone]

## 2023-10-26 ENCOUNTER — Other Ambulatory Visit: Payer: Self-pay | Admitting: Cardiovascular Disease

## 2023-10-26 DIAGNOSIS — G4730 Sleep apnea, unspecified: Secondary | ICD-10-CM | POA: Diagnosis not present

## 2023-10-26 DIAGNOSIS — I480 Paroxysmal atrial fibrillation: Secondary | ICD-10-CM

## 2023-10-26 DIAGNOSIS — R0902 Hypoxemia: Secondary | ICD-10-CM | POA: Diagnosis not present

## 2023-10-26 NOTE — Telephone Encounter (Signed)
 Refill request

## 2023-10-27 ENCOUNTER — Ambulatory Visit: Payer: PPO | Admitting: Internal Medicine

## 2023-10-27 ENCOUNTER — Other Ambulatory Visit: Payer: Self-pay | Admitting: Cardiovascular Disease

## 2023-10-27 DIAGNOSIS — I480 Paroxysmal atrial fibrillation: Secondary | ICD-10-CM

## 2023-10-27 NOTE — Telephone Encounter (Signed)
 Refill request

## 2023-10-28 ENCOUNTER — Telehealth: Payer: Self-pay

## 2023-10-28 NOTE — Telephone Encounter (Signed)
 [name] report received on 10/27/2023 and reviewed by Dr. Belia Heman. Per summary patient does not need Oxygen. Patient advised. Nothing further needed.

## 2023-10-30 DIAGNOSIS — J449 Chronic obstructive pulmonary disease, unspecified: Secondary | ICD-10-CM | POA: Diagnosis not present

## 2023-11-03 ENCOUNTER — Ambulatory Visit: Admitting: Internal Medicine

## 2023-11-03 ENCOUNTER — Encounter: Payer: Self-pay | Admitting: Internal Medicine

## 2023-11-03 VITALS — BP 122/72 | HR 82 | Temp 96.9°F | Ht 65.0 in | Wt 125.0 lb

## 2023-11-03 DIAGNOSIS — J9611 Chronic respiratory failure with hypoxia: Secondary | ICD-10-CM

## 2023-11-03 DIAGNOSIS — J4520 Mild intermittent asthma, uncomplicated: Secondary | ICD-10-CM

## 2023-11-03 NOTE — Patient Instructions (Signed)
 Continue oxygen as prescribed with exertion Continue nebulized therapy as prescribed Avoid Allergens and Irritants Avoid secondhand smoke Avoid SICK contacts Recommend  Masking  when appropriate Recommend Keep up-to-date with vaccinations

## 2023-11-03 NOTE — Progress Notes (Unsigned)
 [facility] [place] Pulmonary Medicine Consultation      Date: 11/03/2023,   MRN# [id number] Beyonka Pitney Tesar 07/25/1932    CHIEF COMPLAINT:   Follow-up assessment of asthma   HISTORY OF PRESENT ILLNESS   89 year old pleasant white female seen today for assessment of shortness of breath Patient has  had childhood asthma She states she was will be it was well-controlled for many years Patient states she used albuterol as needed However as a child she missed many days of school due to her asthma  Patient started on Pulmicort and Brovana nebulizers This has significantly helped her symptoms over the past several weeks Patient with respiratory insufficiency and needs nebulized therapy Unable to tolerate traditional inhaler therapy  Patient with severe hypoxia at last office visit  She currently uses oxygen with exertion  Overnight pulse oximetry did not reveal any significant hypoxia    patient also does have severe mitral valve disease which was recently seen on echocardiogram Patient sees cardiology and uses diuretics as needed  No exacerbation at this time No evidence of heart failure at this time No evidence or signs of infection at this time No respiratory distress No fevers, chills, nausea, vomiting, diarrhea No evidence of lower extremity edema No evidence hemoptysis  She has progressive mitral valve disease based on echocardiogram evidence Patient uses diuretics as needed Sees cardiology   SATURATION QUALIFICATIONS: (This note is used to comply with regulatory documentation for home oxygen)   Patient Saturations on Room Air at Rest = 99%   Patient Saturations on Room Air while Ambulating = 83%   Patient Saturations on 2 Liters of oxygen while Ambulating = 96%   Please briefly explain why patient needs home oxygen:Does not use O2 at night anymore [id number]  PAST MEDICAL HISTORY   Past Medical History:  Diagnosis Date   Aortic atherosclerosis (HCC)    a.  05/2021 TEE: GrIII atheroma plaque involving the asc, transverse, and desc Ao.   Asthma    CAD (coronary artery disease)    a. 04/2021 Cath: LM nl, LAD 85p/m, D1 80, RI nl, LCX nl, RCA nl; b. 07/2021 PCI: pLAD (2.75x26 Onyx Frontier DES), D1 (2.5x22 Onyx Frontier DES).   Carotid artery disease (HCC)    a. s/p R carotid stenting (9mm x 7mm x 4cm long Exact stent); b. 06/2021 U/S: RICA 40-59%, LICA 40-59%.   CHF (congestive heart failure) (HCC)    Community acquired pneumonia    Essential hypertension    Glaucoma    Hyperlipidemia    Mitral regurgitation    a. TTE 08/2015: EF 60-65%, normal wall motion, mild MR, mildly dilated left atrium measuring 40 mm, RVSF normal, PASP normal; b. 05/2021 TEE: Moderate MR.   Mitral stenosis    a. 05/2021 L/RHC: Sev MS w/ mean grad 13-12mmHg and MV area of 0.5-06.cm^2; b. 05/2021 TEE: EF 55-60%, no rwma, nl RV fxn, mod MR, mod MS (MV area by P1/2t: 1.61 cm^2 w/ mean grad of ).   Peripheral neuropathy    Persistent atrial fibrillation (HCC) 09/17/2015   a. s/p DCCV 11/15/2015; b. CHADS2VASc => 4 (HTN, age x 2, female)--> warfarin; c. 06/2021 recurrent afib-->amio added.   Squamous cell carcinoma of skin 12/18/2021   R dorsum hand, EDC   Squamous cell carcinoma of skin 12/31/2021   R hand dorsum, recurrent - excised 02/04/2022   Squamous cell carcinoma of skin 12/31/2021   L forearm - ED&C   Squamous cell carcinoma of skin 06/24/2022   R  thumb webspace with wart virus - ED&C   Squamous cell carcinoma of skin 10/20/2022   L lat knee - tx with ED&C     SURGICAL HISTORY   Past Surgical History:  Procedure Laterality Date   ABDOMINAL HYSTERECTOMY  1987   due to heavy bleeding   APPENDECTOMY     CARDIAC CATHETERIZATION     CAROTID PTA/STENT INTERVENTION Right 08/15/2020   Procedure: CAROTID PTA/STENT INTERVENTION;  Surgeon: Annice Needy, MD;  Location: [facility] INVASIVE CV LAB;  Service: Cardiovascular;  Laterality: Right;   CATARACT EXTRACTION      CORONARY IMAGING/OCT N/A 08/02/2021   Procedure: INTRAVASCULAR IMAGING/OCT;  Surgeon: Yvonne Kendall, MD;  Location: [facility] CV [facility];  Service: Cardiovascular;  Laterality: N/A;   CORONARY STENT INTERVENTION N/A 08/02/2021   Procedure: CORONARY STENT INTERVENTION;  Surgeon: Yvonne Kendall, MD;  Location: [facility] CV [facility];  Service: Cardiovascular;  Laterality: N/A;   ELECTROPHYSIOLOGIC STUDY N/A 11/15/2015   Procedure: CARDIOVERSION;  Surgeon: Antonieta Iba, MD;  Location: [facility] ORS;  Service: Cardiovascular;  Laterality: N/A;   LEFT HEART CATH AND CORONARY ANGIOGRAPHY N/A 08/02/2021   Procedure: LEFT HEART CATH AND CORONARY ANGIOGRAPHY;  Surgeon: Yvonne Kendall, MD;  Location: [facility];  Service: Cardiovascular;  Laterality: N/A;   LEFT HEART CATH AND CORONARY ANGIOGRAPHY N/A 08/07/2021   Procedure: LEFT HEART CATH AND CORONARY ANGIOGRAPHY;  Surgeon: Iran Ouch, MD;  Location: [facility] INVASIVE CV [facility];  Service: Cardiovascular;  Laterality: N/A;   RIGHT/LEFT HEART CATH AND CORONARY ANGIOGRAPHY N/A 05/17/2021   Procedure: RIGHT/LEFT HEART CATH AND CORONARY ANGIOGRAPHY;  Surgeon: Yvonne Kendall, MD;  Location: [facility] INV[facility] CV [facility];  Service: Cardiovascular;  Laterality: N/A;   TEE WITHOUT CARDIOVERSION N/A 06/13/2021   Procedure: TRANSESOPHAGEAL ECHOCARDIOGRAM (TEE);  Surgeon: Antonieta Iba, MD;  Location: [facility] ORS;  Service: Cardiovascular;  Laterality: N/A;     FAMILY HISTORY   Family History  Problem Relation Age of Onset   CAD Mother    CAD Father    Cancer Son 60       lung cancer     SOCIAL HISTORY   Social History   Tobacco Use   Smoking status: Never   Smokeless tobacco: Never   Tobacco comments:    never   Vaping Use   Vaping status: Never Used  Substance Use Topics   Alcohol use: No   Drug use: Never     MEDICATIONS    Home Medication:  Current Outpatient Rx   Order #: [id number] Class: Normal   Order #: [id number] Class: Normal    Order #: [id number] Class: Historical Med   Order #: [id number] Class: Historical Med   Order #: [id number] Class: Normal   Order #: [id number] Class: Normal   Order #: [id number] Class: Historical Med   Order #: [id number] Class: Normal   Order #: [id number] Class: Normal   Order #: [id number] Class: Historical Med   Order #: [id number] Class: Normal   Order #: [id number] Class: Historical Med   Order #: [id number] Class: Historical Med   Order #: [id number] Class: Normal   Order #: [id number] Class: Normal   Order #: [id number] Class: Normal   Order #: [id number] Class: Normal   Order #: [id number] Class: Normal   Order #: [id number] Class: Normal   Order #: [id number] Class: Historical Med   Order #: [id number] Class: Historical Med   Order #: [id number] Class: Normal    Current Medication:  Current Outpatient Medications:    arformoterol (BROVANA) 15 MCG/2ML NEBU, Take 2 mLs (15 mcg total) by nebulization 2 (two) times  daily., Disp: 120 mL, Rfl: 5   budesonide (PULMICORT) 0.5 MG/2ML nebulizer solution, Take 2 mLs (0.5 mg total) by nebulization 2 (two) times daily., Disp: 1440 mL, Rfl: 0   Calcium Carbonate-Vitamin D 600-400 MG-UNIT tablet, Take 1 tablet by mouth daily., Disp: , Rfl:    cholecalciferol (VITAMIN D) 1000 UNITS tablet, Take 1,000 Units by mouth daily., Disp: , Rfl:    clopidogrel (PLAVIX) 75 MG tablet, TAKE 1 TABLET BY MOUTH DAILY, Disp: 90 tablet, Rfl: 2   diltiazem (CARDIZEM CD) 120 MG 24 hr capsule, Take 1 capsule (120 mg total) by mouth daily., Disp: 90 capsule, Rfl: 3   dorzolamide (TRUSOPT) 2 % ophthalmic solution, Place 1 drop into both eyes 2 (two) times daily. , Disp: , Rfl:    ezetimibe (ZETIA) 10 MG tablet, TAKE 1 TABLET BY MOUTH DAILY, Disp: 90 tablet, Rfl: 2   ipratropium-albuterol (DUONEB) 0.5-2.5 (3) MG/3ML SOLN, TAKE BY NEBULIZATION EVERY 4 HOURS AS NEEDED, Disp: 360 mL, Rfl: 1   latanoprost (XALATAN) 0.005 % ophthalmic solution, Place 1 drop into both eyes at bedtime. , Disp: ,  Rfl:    levalbuterol (XOPENEX HFA) 45 MCG/ACT inhaler, Inhale 2 puffs into the lungs every 6 (six) hours as needed for wheezing or shortness of breath. (Patient not taking: Reported on 09/28/2023), Disp: 1 each, Rfl: 3   magnesium oxide (MAG-OX) 400 MG tablet, Take 400 mg by mouth daily., Disp: , Rfl:    Multiple Vitamins-Minerals (PRESERVISION AREDS 2+MULTI VIT) CAPS, Take 2 capsules by mouth daily., Disp: , Rfl:    nitroGLYCERIN (NITROSTAT) 0.4 MG SL tablet, Place 1 tablet (0.4 mg total) under the tongue every 5 (five) minutes as needed (for chest pain or shortness of breath)., Disp: 25 tablet, Rfl: 3   potassium chloride (KLOR-CON) 10 MEQ tablet, TAKE 2 TABLETS BY MOUTH DAILY. TAKE EXTRA 2 TABLETS WHEN TAKING EXTRA LASIX., Disp: 200 tablet, Rfl: 2   pregabalin (LYRICA) 50 MG capsule, TAKE 1 CAPSULE BY MOUTH 2 TIMES DAILY, Disp: 60 capsule, Rfl: 5   rosuvastatin (CRESTOR) 20 MG tablet, TAKE 1 TABLET BY MOUTH DAILY, Disp: 90 tablet, Rfl: 3   Tiotropium Bromide-Olodaterol (STIOLTO RESPIMAT) 2.5-2.5 MCG/ACT AERS, Inhale 2 puffs into the lungs daily. (Patient not taking: Reported on 09/28/2023), Disp: 1 each, Rfl: 5   torsemide (DEMADEX) 20 MG tablet, Take 1 tablet (20 mg total) by mouth daily. You may take additional dose (20 mg tab) for 3 pound gain overnight or 5 pound gain in 1 week., Disp: 135 tablet, Rfl: 3   vitamin C (ASCORBIC ACID) 500 MG tablet, Take 500 mg by mouth daily., Disp: , Rfl:    vitamin E 100 UNIT capsule, Take 200 Units by mouth daily., Disp: , Rfl:    warfarin (COUMADIN) 2.5 MG tablet, TAKE 1 & 1/2 TABLETS DAILY EXCEPT TAKE ONE TABLET ON MONDAYS, WEDNESDAYS, AND FRIDAYS OR AS DIRECTED BY COUMADIN CLINIC, Disp: 40 tablet, Rfl: 3    ALLERGIES   Shellfish allergy, Azithromycin, Tamiflu [oseltamivir phosphate], and Albuterol  BP 122/72 (BP Location: Right Arm, Cuff Size: Normal)   Pulse 82   Temp (!) 96.9 F (36.1 C)   Ht 5\' 5"  (1.651 m)   Wt 125 lb (56.7 kg) Comment: per the  patient in a wheelchair today  SpO2 98%   BMI 20.80 kg/m     Review of Systems: Gen:  Denies  fever, sweats, chills weight loss  HEENT: Denies blurred vision, double vision, ear pain, eye pain, hearing  loss, nose bleeds, sore throat Cardiac:  No dizziness, chest pain or heaviness, chest tightness,edema, No JVD Resp:   No cough, +sputum production, -shortness of breath,-wheezing, -hemoptysis,  Other:  All other systems negative   Physical Examination:   General Appearance: No distress  EYES PERRLA, EOM intact.   NECK Supple, No JVD Pulmonary: normal breath sounds, No wheezing.  CardiovascularNormal S1,S2.  No m/r/g.   Abdomen: Benign, Soft, non-tender. Neurology UE/LE 5/5 strength, no focal deficits Ext pulses intact, cap refill intact ALL OTHER ROS ARE NEGATIVE        ASSESSMENT/PLAN   89 year old pleasant white female seen today for follow-up assessment for asthma and shortness of breath with chronic hypoxic respiratory failure   Mild intermittent asthma  Patient can no longer take traditional inhaler therapy  Continue Brovana and Pulmicort nebulizers as maintenance therapy  Avoid Allergens and Irritants Avoid secondhand smoke Avoid SICK contacts Recommend  Masking  when appropriate Recommend Keep up-to-date with vaccinations  Respiratory insufficiency Unable to tolerate traditional inhaler therapy Continue nebulized therapy  Chronic hypoxic respiratory failure Continue oxygen for exertion Patient needs this for survival Overnight pulse oximetry did not reveal hypoxia    SATURATION QUALIFICATIONS: (This note is used to comply with regulatory documentation for home oxygen)  Patient Saturations on Room Air at Rest = 99%  Patient Saturations on Room Air while Ambulating = 83%  Patient Saturations on 2 Liters of oxygen while Ambulating = 96%  Please briefly explain why patient needs home oxygen:Does not use O2 at night anymore [id number]    MEDICATION  ADJUSTMENTS/LABS AND TESTS ORDERED: Continue Pulmicort nebulizers twice daily Continue  Brovana nebulizers twice daily Continue oxygen therapy  with exertion Use DuoNebs every 4 hours as needed throughout the day Avoid Allergens and Irritants Avoid secondhand smoke Avoid SICK contacts Recommend  Masking  when appropriate Recommend Keep up-to-date with vaccinations  CURRENT MEDICATIONS REVIEWED AT LENGTH WITH PATIENT TODAY   Patient  satisfied with Plan of action and management. All questions answered  Follow-up in 3 months  I spent a total of 41 minutes reviewing chart data, face-to-face evaluation with the patient, counseling and coordination of care as detailed above.     Lucie Leather, M.D.  [facility]  Medical Director [facility] Medical Director [facility] Cardio-Pulmonary Department

## 2023-11-05 DIAGNOSIS — I110 Hypertensive heart disease with heart failure: Secondary | ICD-10-CM | POA: Diagnosis not present

## 2023-11-05 DIAGNOSIS — G629 Polyneuropathy, unspecified: Secondary | ICD-10-CM | POA: Diagnosis not present

## 2023-11-05 DIAGNOSIS — D6869 Other thrombophilia: Secondary | ICD-10-CM | POA: Diagnosis not present

## 2023-11-05 DIAGNOSIS — E785 Hyperlipidemia, unspecified: Secondary | ICD-10-CM | POA: Diagnosis not present

## 2023-11-05 DIAGNOSIS — E559 Vitamin D deficiency, unspecified: Secondary | ICD-10-CM | POA: Diagnosis not present

## 2023-11-05 DIAGNOSIS — I25119 Atherosclerotic heart disease of native coronary artery with unspecified angina pectoris: Secondary | ICD-10-CM | POA: Diagnosis not present

## 2023-11-05 DIAGNOSIS — I70223 Atherosclerosis of native arteries of extremities with rest pain, bilateral legs: Secondary | ICD-10-CM | POA: Diagnosis not present

## 2023-11-05 DIAGNOSIS — I4891 Unspecified atrial fibrillation: Secondary | ICD-10-CM | POA: Diagnosis not present

## 2023-11-05 DIAGNOSIS — E876 Hypokalemia: Secondary | ICD-10-CM | POA: Diagnosis not present

## 2023-11-05 DIAGNOSIS — I509 Heart failure, unspecified: Secondary | ICD-10-CM | POA: Diagnosis not present

## 2023-11-05 DIAGNOSIS — J9611 Chronic respiratory failure with hypoxia: Secondary | ICD-10-CM | POA: Diagnosis not present

## 2023-11-05 DIAGNOSIS — I739 Peripheral vascular disease, unspecified: Secondary | ICD-10-CM | POA: Diagnosis not present

## 2023-11-25 ENCOUNTER — Ambulatory Visit: Payer: PPO | Attending: Cardiovascular Disease

## 2023-11-25 DIAGNOSIS — I050 Rheumatic mitral stenosis: Secondary | ICD-10-CM

## 2023-11-25 DIAGNOSIS — I480 Paroxysmal atrial fibrillation: Secondary | ICD-10-CM | POA: Diagnosis not present

## 2023-11-25 DIAGNOSIS — Z5181 Encounter for therapeutic drug level monitoring: Secondary | ICD-10-CM

## 2023-11-25 LAB — POCT INR: INR: 1.9 — AB (ref 2.0–3.0)

## 2023-11-25 NOTE — Patient Instructions (Signed)
 Take 2 tablets today only then CONTINUE 1.5 tablets every day, EXCEPT 1 TABLET ON MONDAY, WEDNESDAY AND FRIDAY.  EAT 2 HELPINGS OF GREENS PER WEEK.   Recheck INR in 6 weeks;  [phone]

## 2023-11-30 DIAGNOSIS — J449 Chronic obstructive pulmonary disease, unspecified: Secondary | ICD-10-CM | POA: Diagnosis not present

## 2023-12-10 DIAGNOSIS — H401131 Primary open-angle glaucoma, bilateral, mild stage: Secondary | ICD-10-CM | POA: Diagnosis not present

## 2023-12-10 DIAGNOSIS — H353123 Nonexudative age-related macular degeneration, left eye, advanced atrophic without subfoveal involvement: Secondary | ICD-10-CM | POA: Diagnosis not present

## 2023-12-10 DIAGNOSIS — H26492 Other secondary cataract, left eye: Secondary | ICD-10-CM | POA: Diagnosis not present

## 2023-12-10 DIAGNOSIS — H353112 Nonexudative age-related macular degeneration, right eye, intermediate dry stage: Secondary | ICD-10-CM | POA: Diagnosis not present

## 2023-12-12 ENCOUNTER — Encounter: Payer: Self-pay | Admitting: Family Medicine

## 2023-12-12 ENCOUNTER — Encounter

## 2023-12-13 ENCOUNTER — Encounter: Payer: Self-pay | Admitting: Emergency Medicine

## 2023-12-13 ENCOUNTER — Other Ambulatory Visit: Payer: Self-pay

## 2023-12-13 ENCOUNTER — Observation Stay (HOSPITAL_BASED_OUTPATIENT_CLINIC_OR_DEPARTMENT_OTHER)
Admission: EM | Admit: 2023-12-13 | Discharge: 2023-12-14 | Disposition: A | Discharge status: Home / Self Care | Source: Home / Self Care | Attending: Emergency Medicine | Admitting: Emergency Medicine

## 2023-12-13 ENCOUNTER — Emergency Department

## 2023-12-13 DIAGNOSIS — R0602 Shortness of breath: Secondary | ICD-10-CM | POA: Diagnosis not present

## 2023-12-13 DIAGNOSIS — J9621 Acute and chronic respiratory failure with hypoxia: Secondary | ICD-10-CM | POA: Diagnosis not present

## 2023-12-13 DIAGNOSIS — Z79899 Other long term (current) drug therapy: Secondary | ICD-10-CM | POA: Insufficient documentation

## 2023-12-13 DIAGNOSIS — I2510 Atherosclerotic heart disease of native coronary artery without angina pectoris: Secondary | ICD-10-CM | POA: Diagnosis present

## 2023-12-13 DIAGNOSIS — I739 Peripheral vascular disease, unspecified: Secondary | ICD-10-CM | POA: Diagnosis present

## 2023-12-13 DIAGNOSIS — I517 Cardiomegaly: Secondary | ICD-10-CM | POA: Diagnosis not present

## 2023-12-13 DIAGNOSIS — J441 Chronic obstructive pulmonary disease with (acute) exacerbation: Secondary | ICD-10-CM | POA: Diagnosis present

## 2023-12-13 DIAGNOSIS — R531 Weakness: Secondary | ICD-10-CM | POA: Diagnosis not present

## 2023-12-13 DIAGNOSIS — Z1152 Encounter for screening for COVID-19: Secondary | ICD-10-CM | POA: Insufficient documentation

## 2023-12-13 DIAGNOSIS — I050 Rheumatic mitral stenosis: Secondary | ICD-10-CM

## 2023-12-13 DIAGNOSIS — I110 Hypertensive heart disease with heart failure: Secondary | ICD-10-CM | POA: Insufficient documentation

## 2023-12-13 DIAGNOSIS — I509 Heart failure, unspecified: Principal | ICD-10-CM

## 2023-12-13 DIAGNOSIS — I480 Paroxysmal atrial fibrillation: Secondary | ICD-10-CM | POA: Diagnosis present

## 2023-12-13 DIAGNOSIS — R918 Other nonspecific abnormal finding of lung field: Secondary | ICD-10-CM | POA: Diagnosis not present

## 2023-12-13 DIAGNOSIS — I10 Essential (primary) hypertension: Secondary | ICD-10-CM | POA: Diagnosis present

## 2023-12-13 DIAGNOSIS — E039 Hypothyroidism, unspecified: Secondary | ICD-10-CM | POA: Diagnosis present

## 2023-12-13 DIAGNOSIS — I5033 Acute on chronic diastolic (congestive) heart failure: Secondary | ICD-10-CM

## 2023-12-13 DIAGNOSIS — I342 Nonrheumatic mitral (valve) stenosis: Secondary | ICD-10-CM | POA: Insufficient documentation

## 2023-12-13 DIAGNOSIS — J449 Chronic obstructive pulmonary disease, unspecified: Secondary | ICD-10-CM | POA: Diagnosis not present

## 2023-12-13 DIAGNOSIS — Z955 Presence of coronary angioplasty implant and graft: Secondary | ICD-10-CM | POA: Diagnosis not present

## 2023-12-13 LAB — LACTIC ACID, PLASMA: Lactic Acid, Venous: 1.9 mmol/L (ref 0.5–1.9)

## 2023-12-13 LAB — CBC
HCT: 39 % (ref 36.0–46.0)
Hemoglobin: 11.9 g/dL — ABNORMAL LOW (ref 12.0–15.0)
MCH: 22.5 pg — ABNORMAL LOW (ref 26.0–34.0)
MCHC: 30.5 g/dL (ref 30.0–36.0)
MCV: 73.6 fL — ABNORMAL LOW (ref 80.0–100.0)
Platelets: 180 10*3/uL (ref 150–400)
RBC: 5.3 MIL/uL — ABNORMAL HIGH (ref 3.87–5.11)
RDW: 17.8 % — ABNORMAL HIGH (ref 11.5–15.5)
WBC: 15.2 10*3/uL — ABNORMAL HIGH (ref 4.0–10.5)
nRBC: 0 % (ref 0.0–0.2)

## 2023-12-13 LAB — BASIC METABOLIC PANEL WITH GFR
Anion gap: 10 (ref 5–15)
BUN: 21 mg/dL (ref 8–23)
CO2: 23 mmol/L (ref 22–32)
Calcium: 8.3 mg/dL — ABNORMAL LOW (ref 8.9–10.3)
Chloride: 104 mmol/L (ref 98–111)
Creatinine, Ser: 0.9 mg/dL (ref 0.44–1.00)
GFR, Estimated: >60 mL/min (ref >60)
Glucose, Bld: 118 mg/dL — ABNORMAL HIGH (ref 70–99)
Potassium: 3.8 mmol/L (ref 3.5–5.1)
Sodium: 137 mmol/L (ref 135–145)

## 2023-12-13 LAB — BRAIN NATRIURETIC PEPTIDE: B Natriuretic Peptide: 532.4 pg/mL — ABNORMAL HIGH (ref 0.0–100.0)

## 2023-12-13 LAB — RESP PANEL BY RT-PCR (RSV, FLU A&B, COVID)  RVPGX2
Influenza A by PCR: NEGATIVE
Influenza B by PCR: NEGATIVE
Resp Syncytial Virus by PCR: NEGATIVE
SARS Coronavirus 2 by RT PCR: NEGATIVE

## 2023-12-13 LAB — PROTIME-INR
INR: 2.2 — ABNORMAL HIGH (ref 0.8–1.2)
Prothrombin Time: 25 s — ABNORMAL HIGH (ref 11.4–15.2)

## 2023-12-13 LAB — TROPONIN I (HIGH SENSITIVITY): Troponin I (High Sensitivity): 12 ng/L (ref <18)

## 2023-12-13 LAB — PROCALCITONIN: Procalcitonin: <0.1 ng/mL

## 2023-12-13 MED ORDER — WARFARIN SODIUM 2.5 MG PO TABS
3.7500 mg | ORAL_TABLET | Freq: Once | ORAL | Status: AC
  Administered 2023-12-13: 3.75 mg via ORAL
  Filled 2023-12-13: qty 1

## 2023-12-13 MED ORDER — CLOPIDOGREL BISULFATE 75 MG PO TABS
75.0000 mg | ORAL_TABLET | Freq: Every day | ORAL | Status: DC
  Administered 2023-12-14: 75 mg via ORAL
  Filled 2023-12-13: qty 1

## 2023-12-13 MED ORDER — GUAIFENESIN ER 600 MG PO TB12
600.0000 mg | ORAL_TABLET | Freq: Two times a day (BID) | ORAL | Status: DC
  Administered 2023-12-13 – 2023-12-14 (×2): 600 mg via ORAL
  Filled 2023-12-13 (×2): qty 1

## 2023-12-13 MED ORDER — EZETIMIBE 10 MG PO TABS
10.0000 mg | ORAL_TABLET | Freq: Every day | ORAL | Status: DC
  Administered 2023-12-14: 10 mg via ORAL
  Filled 2023-12-13: qty 1

## 2023-12-13 MED ORDER — ONDANSETRON HCL 4 MG PO TABS: 4.0000 mg | ORAL_TABLET | Freq: Four times a day (QID) | ORAL | Status: DC | PRN

## 2023-12-13 MED ORDER — ACETAMINOPHEN 650 MG RE SUPP: 650.0000 mg | Freq: Four times a day (QID) | RECTAL | Status: DC | PRN

## 2023-12-13 MED ORDER — METHYLPREDNISOLONE SODIUM SUCC 125 MG IJ SOLR
125.0000 mg | Freq: Once | INTRAMUSCULAR | Status: AC
  Administered 2023-12-13: 125 mg via INTRAVENOUS
  Filled 2023-12-13: qty 2

## 2023-12-13 MED ORDER — VITAMIN D 25 MCG (1000 UNIT) PO TABS
1000.0000 [IU] | ORAL_TABLET | Freq: Every day | ORAL | Status: DC
  Administered 2023-12-14: 1000 [IU] via ORAL
  Filled 2023-12-13: qty 1

## 2023-12-13 MED ORDER — POLYETHYLENE GLYCOL 3350 17 G PO PACK: 17.0000 g | PACK | Freq: Every day | ORAL | Status: DC | PRN

## 2023-12-13 MED ORDER — BENZONATATE 100 MG PO CAPS: 200.0000 mg | ORAL_CAPSULE | Freq: Three times a day (TID) | ORAL | Status: DC | PRN

## 2023-12-13 MED ORDER — ROSUVASTATIN CALCIUM 20 MG PO TABS
20.0000 mg | ORAL_TABLET | Freq: Every evening | ORAL | Status: DC
  Filled 2023-12-13: qty 1

## 2023-12-13 MED ORDER — SODIUM CHLORIDE 0.9% FLUSH
3.0000 mL | Freq: Two times a day (BID) | INTRAVENOUS | Status: DC
  Administered 2023-12-13 – 2023-12-14 (×2): 3 mL via INTRAVENOUS

## 2023-12-13 MED ORDER — IPRATROPIUM-ALBUTEROL 0.5-2.5 (3) MG/3ML IN SOLN
3.0000 mL | Freq: Four times a day (QID) | RESPIRATORY_TRACT | Status: DC
  Administered 2023-12-13 – 2023-12-14 (×3): 3 mL via RESPIRATORY_TRACT
  Filled 2023-12-13 (×3): qty 3

## 2023-12-13 MED ORDER — WARFARIN - PHARMACIST DOSING INPATIENT
Freq: Every day | Status: DC
  Filled 2023-12-13: qty 1

## 2023-12-13 MED ORDER — LATANOPROST 0.005 % OP SOLN
1.0000 [drp] | Freq: Every day | OPHTHALMIC | Status: DC
  Administered 2023-12-13: 1 [drp] via OPHTHALMIC
  Filled 2023-12-13: qty 2.5

## 2023-12-13 MED ORDER — FUROSEMIDE 10 MG/ML IJ SOLN
40.0000 mg | Freq: Once | INTRAMUSCULAR | Status: AC
  Administered 2023-12-13: 40 mg via INTRAVENOUS
  Filled 2023-12-13: qty 4

## 2023-12-13 MED ORDER — PREGABALIN 50 MG PO CAPS
50.0000 mg | ORAL_CAPSULE | Freq: Two times a day (BID) | ORAL | Status: DC
  Administered 2023-12-13 – 2023-12-14 (×2): 50 mg via ORAL
  Filled 2023-12-13 (×2): qty 1

## 2023-12-13 MED ORDER — BUDESONIDE 0.5 MG/2ML IN SUSP
0.5000 mg | Freq: Two times a day (BID) | RESPIRATORY_TRACT | Status: DC
  Administered 2023-12-13 – 2023-12-14 (×2): 0.5 mg via RESPIRATORY_TRACT
  Filled 2023-12-13 (×2): qty 2

## 2023-12-13 MED ORDER — ACETAMINOPHEN 325 MG PO TABS: 650.0000 mg | ORAL_TABLET | Freq: Four times a day (QID) | ORAL | Status: DC | PRN

## 2023-12-13 MED ORDER — ONDANSETRON HCL 4 MG/2ML IJ SOLN: 4.0000 mg | Freq: Four times a day (QID) | INTRAMUSCULAR | Status: DC | PRN

## 2023-12-13 MED ORDER — DILTIAZEM HCL ER COATED BEADS 120 MG PO CP24
120.0000 mg | ORAL_CAPSULE | Freq: Every day | ORAL | Status: DC
  Administered 2023-12-14: 120 mg via ORAL
  Filled 2023-12-13: qty 1

## 2023-12-13 NOTE — Assessment & Plan Note (Signed)
 Well-controlled on diltiazem  only.  - Continue home regimen

## 2023-12-13 NOTE — Assessment & Plan Note (Addendum)
 History of mild intermittent asthma, with diffuse wheezing on examination today.  - Solu-Medrol  125 mg once - Start prednisone  40 mg tomorrow if patient is agreeable (hesitant due to prior RVR with continued steroid use) - RVP and procalcitonin pending - DuoNebs every 6 hours - Pulmicort  twice daily - Hold home bronchodilators

## 2023-12-13 NOTE — Assessment & Plan Note (Signed)
 -  Continue home regimen

## 2023-12-13 NOTE — ED Provider Notes (Signed)
 [facility] Provider Note    Event Date/Time   First MD Initiated Contact with Patient 12/13/23 1706     (approximate)   History   Shortness of Breath   HPI  Molly Hodges is a 89 y.o. female with history of A-fib on warfarin, asthma, on oxygen  with exertion, severe mitral valve disease on diuretics who comes in with concerns for shortness of breath.  Patient reports gaining some weight and having some increased weakness.  Patient denies any falls or hitting her head.  She reports concern for possibly having a UTI given some increasing weakness.  She states that when she has fluid buildup that she does not develop swelling in her legs.  She just feels more short of breath.  She reports a 3 pound weight gain.  She has been trying to take double of her torsemide  without any effect and still remains short of breath.  Given her history of asthma   Patient reports that she has been in A-fib for years now.  They attempted to cardiovert her out of it 1 time but then when she went back into it they recommended just staying in A-fib.  Physical Exam   Triage Vital Signs: ED Triage Vitals  Encounter Vitals Group     BP 12/13/23 1632 (!) 157/64     Systolic BP Percentile --      Diastolic BP Percentile --      Pulse Rate 12/13/23 1631 67     Resp 12/13/23 1631 17     Temp 12/13/23 1631 98 F (36.7 C)     Temp Source 12/13/23 1631 Oral     SpO2 12/13/23 1631 97 %     Weight 12/13/23 1631 123 lb (55.8 kg)     Height 12/13/23 1631 5\' 5"  (1.651 m)     Head Circumference --      Peak Flow --      Pain Score 12/13/23 1631 0     Pain Loc --      Pain Education --      Exclude from Growth Chart --     Most recent vital signs: Vitals:   12/13/23 1631 12/13/23 1632  BP:  (!) 157/64  Pulse: 67   Resp: 17   Temp: 98 F (36.7 C)   SpO2: 97%      General: Awake, no distress.  CV:  Good peripheral perfusion.  Resp:  No obvious wheezing.  She does have a  slight rhonchi noted on the left.  She does have increased work of breathing with talking and occasional has to stop to take a deep breath. Abd:  No distention.  Other:  No swelling no calf tenderness    ED Results / Procedures / Treatments   Labs (all labs ordered are listed, but only abnormal results are displayed) Labs Reviewed  BASIC METABOLIC PANEL WITH GFR - Abnormal; Notable for the following components:      Result Value   Glucose, Bld 118 (*)    Calcium  8.3 (*)    All other components within normal limits  CBC - Abnormal; Notable for the following components:   WBC 15.2 (*)    RBC 5.30 (*)    Hemoglobin 11.9 (*)    MCV 73.6 (*)    MCH 22.5 (*)    RDW 17.8 (*)    All other components within normal limits  BRAIN NATRIURETIC PEPTIDE  TROPONIN I (HIGH SENSITIVITY)     EKG  My interpretation of EKG:  Atrial fibrillation with a rate of 78 without any ST elevation or T wave inversions, normal intervals  RADIOLOGY I have reviewed the xray personally and interpreted no obvious pneumonia.  Some blunting of the costophrenic angles possibly some mild edema   PROCEDURES:  Critical Care performed: No  .1-3 Lead EKG Interpretation  Performed by: Lubertha Rush, MD Authorized by: Lubertha Rush, MD     Interpretation: normal     ECG rate:  70   ECG rate assessment: normal     Rhythm: atrial fibrillation     Ectopy: none     Conduction: normal      MEDICATIONS ORDERED IN ED: Medications  furosemide  (LASIX ) injection 40 mg (40 mg Intravenous Given 12/13/23 1845)     IMPRESSION / MDM / ASSESSMENT AND PLAN / ED COURSE  I reviewed the triage vital signs and the nursing notes.   Patient's presentation is most consistent with acute presentation with potential threat to life or bodily function.   Suspect this is most likely related to patient's severe mitral valve stenosis and potentially some mild fluid overload based upon the patient's chest x-ray, elevated BNP.   Her INR level is therapeutic so doubt PE.  I considered the possibility of pneumonia given elevated white count but she is afebrile really does not have much of a cough.  Her procalcitonin and chest x-ray read are still pending and the hospitalist is aware of this but we have held antibiotics for right now while pending these results.  She does have a history of asthma and I considered giving patient some albuterol  but she states that just makes her very jittery and she really does not have any wheezing on examination.  Given her work of breathing I do not feel comfortable sending patient home.  We will discuss with the hospital team for admission.  CBC shows elevated white count.  Hemoglobin is similar to priors.  BMP is reassuring  Patient was placed on 2 L for comfort was never hypoxic but given work of breathing with speaking she felt more comfortable on the oxygen .  The patient is on the cardiac monitor to evaluate for evidence of arrhythmia and/or significant heart rate changes.      FINAL CLINICAL IMPRESSION(S) / ED DIAGNOSES   Final diagnoses:  Congestive heart failure, unspecified HF chronicity, unspecified heart failure type (HCC)  Mitral valve stenosis, unspecified etiology     Rx / DC Orders   ED Discharge Orders     None        Note:  This document was prepared using Dragon voice recognition software and may include unintentional dictation errors.   Lubertha Rush, MD 12/13/23 [phone]

## 2023-12-13 NOTE — Assessment & Plan Note (Addendum)
-   Continue home Plavix , statin, Zetia

## 2023-12-13 NOTE — Assessment & Plan Note (Signed)
 Most recent echocardiogram in April 2025 with moderate to severe mitral valve stenosis with peak gradient of 27.9 and mean gradient of 10.5.She follows with Dr. Gollan.

## 2023-12-13 NOTE — Progress Notes (Signed)
 PHARMACY - ANTICOAGULATION CONSULT NOTE  Pharmacy Consult for Warfarin Indication: atrial fibrillation  Allergies  Allergen Reactions   Shellfish Allergy Anaphylaxis    Other reaction(s): Hallucination   Azithromycin  Other (See Comments)    Extreme burning sensation at IV site    Tamiflu [Oseltamivir Phosphate] Other (See Comments)    Reaction:  Hallucinations     Albuterol  Palpitations    Heart racing.     Patient Measurements: Height: 5\' 5"  (165.1 cm) Weight: 55.8 kg (123 lb) IBW/kg (Calculated) : 57 HEPARIN  DW (KG): 55.8  Vital Signs: Temp: 98 F (36.7 C) (04/27 1631) Temp Source: Oral (04/27 1631) BP: 136/57 (04/27 1830) Pulse Rate: 79 (04/27 1839)  Labs: Recent Labs    12/13/23 1633 12/13/23 1721  HGB 11.9*  --   HCT 39.0  --   PLT 180  --   LABPROT  --  25.0*  INR  --  2.2*  CREATININE 0.90  --   TROPONINIHS 12  --     Estimated Creatinine Clearance: 36.6 mL/min (by C-G formula based on SCr of 0.9 mg/dL).   Medical History: Past Medical History:  Diagnosis Date   Aortic atherosclerosis (HCC)    a. 05/2021 TEE: GrIII atheroma plaque involving the asc, transverse, and desc Ao.   Asthma    CAD (coronary artery disease)    a. 04/2021 Cath: LM nl, LAD 85p/m, D1 80, RI nl, LCX nl, RCA nl; b. 07/2021 PCI: pLAD (2.75x26 Onyx Frontier DES), D1 (2.5x22 Onyx Frontier DES).   Carotid artery disease (HCC)    a. s/p R carotid stenting (9mm x 7mm x 4cm long Exact stent); b. 06/2021 U/S: RICA 40-59%, LICA 40-59%.   CHF (congestive heart failure) (HCC)    Community acquired pneumonia    Essential hypertension    Glaucoma    Hyperlipidemia    Mitral regurgitation    a. TTE 08/2015: EF 60-65%, normal wall motion, mild MR, mildly dilated left atrium measuring 40 mm, RVSF normal, PASP normal; b. 05/2021 TEE: Moderate MR.   Mitral stenosis    a. 05/2021 L/RHC: Sev MS w/ mean grad 13-37mmHg and MV area of 0.5-06.cm^2; b. 05/2021 TEE: EF 55-60%, no rwma, nl RV fxn, mod  MR, mod MS (MV area by P1/2t: 1.61 cm^2 w/ mean grad of ).   Peripheral neuropathy    Persistent atrial fibrillation (HCC) 09/17/2015   a. s/p DCCV 11/15/2015; b. CHADS2VASc => 4 (HTN, age x 2, female)--> warfarin; c. 06/2021 recurrent afib-->amio added.   Squamous cell carcinoma of skin 12/18/2021   R dorsum hand, EDC   Squamous cell carcinoma of skin 12/31/2021   R hand dorsum, recurrent - excised 02/04/2022   Squamous cell carcinoma of skin 12/31/2021   L forearm - ED&C   Squamous cell carcinoma of skin 06/24/2022   R thumb webspace with wart virus - ED&C   Squamous cell carcinoma of skin 10/20/2022   L lat knee - tx with ED&C    Medications:  Warfarin 2.5 mg PO MWF, all other days 3.75 mg  Assessment: Patient is a 89 year old female who presented to ED for SOB. Patient has a history of CHF and has had a recent weight gain of 3 lbs. She is being admitted for acute on chronic hypoxic respiratory failure and acute on chronic heart failure with preserved ejection fraction. Pharmacy has been consulted to dose her home warfarin for a-fib while inpatient.   Home Dose: Warfarin 2.5 mg PO MWF, all other days 3.75  mg (has 2.5 mg tabs at home) Date/time of last dose: 4/26 @ ~1600 Baseline INR: 2.2 Goal INR: 2-3  Date INR Warfarin Dose  4/27 2.2 3.75 mg        Goal of Therapy:  INR 2-3 Monitor platelets by anticoagulation protocol: Yes   Plan:  INR therapeutic  Will order warfarin 3.75 mg PO x 1 (home dose) Monitor INR daily   Alice Innocent, PharmD Clinical Pharmacist  12/13/2023,7:47 PM

## 2023-12-13 NOTE — Assessment & Plan Note (Signed)
 Patient is presenting with 3 days of increased shortness of breath and productive cough, noted to be hypoxic at home requiring 2 L at rest.  Previously on 2 L only with exertion.  Her oxygen  saturation has been normal while in the ER though.  Given diffuse wheezing and new cough, concern for asthma exacerbation.  However in the setting of elevated BNP and rapid weight gain, likely a component of heart failure as well although chest x-ray is reassuring.  - Continue supplemental oxygen  to maintain oxygen  saturation above 88% - Wean as tolerated to home 2 L only with exertion - Pulmonary toilet

## 2023-12-13 NOTE — Assessment & Plan Note (Signed)
 History of HFpEF with last EF of 55-60% in the setting of worsening mitral stenosis, maintained on torsemide .  Patient feels her symptoms are due to hypervolemia given rapid weight gain over the past 24 hours.  BNP increased compared to prior.  - One-time dose of Lasix  40 mg IV - Reassess volume status tomorrow prior to additional doses - Daily weights - Strict in and out - Daily BMP

## 2023-12-13 NOTE — ED Triage Notes (Signed)
 Patient to ED via POV for SOB. Hx of CHF. Recent 3lb weight gain. Also having increased weakness.

## 2023-12-13 NOTE — Assessment & Plan Note (Signed)
 No chest pain reported at this time and negative troponin.  No ischemic EKG changes.  - Continue home Plavix , statin, Zetia 

## 2023-12-13 NOTE — H&P (Signed)
 History and Physical    Patient: Molly Hodges ZOX:[id number] DOB: [birth year] DOA: 12/13/2023 DOS: the patient was seen and examined on 12/13/2023 PCP: Lamon Pillow, MD  Patient coming from: Home  Chief Complaint:  Chief Complaint  Patient presents with   Shortness of Breath   HPI: Molly Hodges is a 89 y.o. female with medical history significant of asthma complicated by chronic hypoxic respiratory failure on 2 L with exertion, severe mitral stenosis, HFpEF, atrial fibrillation on warfarin, hypertension, hyperlipidemia, hypothyroidism, CAD s/p PCI to proximal LAD (2022), carotid artery disease s/p right carotid stenting, who presents to the ED due to shortness of breath.  Molly Hodges states that she has been experiencing gradually worsening shortness of breath for approximately 3 days, during which time she is also been experiencing increasing productive cough.  She feels that this is due to pulmonary edema, stating that she gained 5 kg over the last 24 hours.  She denies any orthopnea, lower extremity swelling or abdominal distention though.  She denies any known sick contacts, fever, chills.  ED course: On arrival to the ED, patient was hypertensive at 157/64 with heart rate of 67.  She was saturating at 97% on room air but subsequently placed on 2 L.  She was tachypneic.  She was afebrile at 98.Initial workup notable for WBC of 15.2, hemoglobin 11.9, and unremarkable BMP.  B NP elevated at 532.  Troponin and lactic acid within normal limits.  COVID-19, influenza and RSV PCR negative.  INR 2.2.  Chest x-ray with no acute cardiopulmonary pathology but possible trace right pleural effusion.  Patient started on Lasix  and [facility] contacted for admission.  Review of Systems: As mentioned in the history of present illness. All other systems reviewed and are negative.  Past Medical History:  Diagnosis Date   Aortic atherosclerosis (HCC)    a. 05/2021 TEE: GrIII atheroma plaque involving  the asc, transverse, and desc Ao.   Asthma    CAD (coronary artery disease)    a. 04/2021 Cath: LM nl, LAD 85p/m, D1 80, RI nl, LCX nl, RCA nl; b. 07/2021 PCI: pLAD (2.75x26 Onyx Frontier DES), D1 (2.5x22 Onyx Frontier DES).   Carotid artery disease (HCC)    a. s/p R carotid stenting (9mm x 7mm x 4cm long Exact stent); b. 06/2021 U/S: RICA 40-59%, LICA 40-59%.   CHF (congestive heart failure) (HCC)    Community acquired pneumonia    Essential hypertension    Glaucoma    Hyperlipidemia    Mitral regurgitation    a. TTE 08/2015: EF 60-65%, normal wall motion, mild MR, mildly dilated left atrium measuring 40 mm, RVSF normal, PASP normal; b. 05/2021 TEE: Moderate MR.   Mitral stenosis    a. 05/2021 L/RHC: Sev MS w/ mean grad 13-100mmHg and MV area of 0.5-06.cm^2; b. 05/2021 TEE: EF 55-60%, no rwma, nl RV fxn, mod MR, mod MS (MV area by P1/2t: 1.61 cm^2 w/ mean grad of ).   Peripheral neuropathy    Persistent atrial fibrillation (HCC) 09/17/2015   a. s/p DCCV 11/15/2015; b. CHADS2VASc => 4 (HTN, age x 2, female)--> warfarin; c. 06/2021 recurrent afib-->amio added.   Squamous cell carcinoma of skin 12/18/2021   R dorsum hand, EDC   Squamous cell carcinoma of skin 12/31/2021   R hand dorsum, recurrent - excised 02/04/2022   Squamous cell carcinoma of skin 12/31/2021   L forearm - ED&C   Squamous cell carcinoma of skin 06/24/2022   R thumb webspace  with wart virus - ED&C   Squamous cell carcinoma of skin 10/20/2022   L lat knee - tx with ED&C   Past Surgical History:  Procedure Laterality Date   ABDOMINAL HYSTERECTOMY  1987   due to heavy bleeding   APPENDECTOMY     CARDIAC CATHETERIZATION     CAROTID PTA/STENT INTERVENTION Right 08/15/2020   Procedure: CAROTID PTA/STENT INTERVENTION;  Surgeon: Celso College, MD;  Location: [facility] IN[facility]ASIVE [facility];  Service: Cardiovascular;  Laterality: Right;   CATARACT EXTRACTION     CORONARY IMAGING/OCT N/A 08/02/2021   Procedure: INTRAVASCULAR  IMAGING/OCT;  Surgeon: Sammy Crisp, MD;  Location: [facility];  Service: Cardiovascular;  Laterality: N/A;   CORONARY STENT INTERVENTION N/A 08/02/2021   Procedure: CORONARY STENT INTERVENTION;  Surgeon: Sammy Crisp, MD;  Location: [facility];  Service: Cardiovascular;  Laterality: N/A;   ELECTROPHYSIOLOGIC STUDY N/A 11/15/2015   Procedure: CARDIOVERSION;  Surgeon: Devorah Fonder, MD;  Location: [facility] ORS;  Service: Cardiovascular;  Laterality: N/A;   LEFT HEART CATH AND CORONARY ANGIOGRAPHY N/A 08/02/2021   Procedure: LEFT HEART CATH AND CORONARY ANGIOGRAPHY;  Surgeon: Sammy Crisp, MD;  Location: [facility];  Service: Cardiovascular;  Laterality: N/A;   LEFT HEART CATH AND CORONARY ANGIOGRAPHY N/A 08/07/2021   Procedure: LEFT HEART CATH AND CORONARY ANGIOGRAPHY;  Surgeon: Wenona Hamilton, MD;  Location: [facility];  Service: Cardiovascular;  Laterality: N/A;   RIGHT/LEFT HEART CATH AND CORONARY ANGIOGRAPHY N/A 05/17/2021   Procedure: RIGHT/LEFT HEART CATH AND CORONARY ANGIOGRAPHY;  Surgeon: Sammy Crisp, MD;  Location: [facility];  Service: Cardiovascular;  Laterality: N/A;   TEE WITHOUT CARDIOVERSION N/A 06/13/2021   Procedure: TRANSESOPHAGEAL ECHOCARDIOGRAM (TEE);  Surgeon: Gollan, Timothy J, MD;  Location: [facility] ORS;  Service: Cardiovascular;  Laterality: N/A;   Social History:  reports that she has never smoked. She has never used smokeless tobacco. She reports that she does not drink alcohol and does not use drugs.  Allergies  Allergen Reactions   Shellfish Allergy Anaphylaxis    Other reaction(s): Hallucination   Azithromycin  Other (See Comments)    Extreme burning sensation at IV site    Tamiflu [Oseltamivir Phosphate] Other (See Comments)    Reaction:  Hallucinations     Albuterol  Palpitations    Heart racing.     Family History  Problem Relation Age of Onset   CAD Mother    CAD Father    Cancer Son 12        lung cancer    Prior to Admission medications   Medication Sig Start Date End Date Taking? Authorizing Provider  arformoterol  (BROVANA ) 15 MCG/2ML NEBU Take 2 mLs (15 mcg total) by nebulization 2 (two) times daily. 09/28/23   Kasa, Kurian, MD  budesonide  (PULMICORT ) 0.5 MG/2ML nebulizer solution Take 2 mLs (0.5 mg total) by nebulization 2 (two) times daily. 09/28/23 09/27/24  Kasa, Kurian, MD  Calcium  Carbonate-Vitamin D  600-400 MG-UNIT tablet Take 1 tablet by mouth daily.    [provider]  cholecalciferol  (VITAMIN D ) 1000 UNITS tablet Take 1,000 Units by mouth daily.    [provider]  clopidogrel  (PLAVIX ) 75 MG tablet TAKE 1 TABLET BY MOUTH DAILY 04/13/23   Gollan, Timothy J, MD  diltiazem  (CARDIZEM  CD) 120 MG 24 hr capsule Take 1 capsule (120 mg total) by mouth daily. 01/07/23   Gollan, Timothy J, MD  dorzolamide  (TRUSOPT ) 2 % ophthalmic solution Place 1 drop into both eyes 2 (  two) times daily.     [provider]  ezetimibe  (ZETIA ) 10 MG tablet TAKE 1 TABLET BY MOUTH DAILY 06/22/23   Gollan, Timothy J, MD  ipratropium-albuterol  (DUONEB) 0.5-2.5 (3) MG/3ML SOLN TAKE BY NEBULIZATION EVERY 4 HOURS AS NEEDED 04/14/23   Lamon Pillow, MD  latanoprost  (XALATAN ) 0.005 % ophthalmic solution Place 1 drop into both eyes at bedtime.     [provider]  levalbuterol  (XOPENEX  HFA) 45 MCG/ACT inhaler Inhale 2 puffs into the lungs every 6 (six) hours as needed for wheezing or shortness of breath. Patient not taking: Reported on 09/28/2023 07/25/22   Lamon Pillow, MD  magnesium  oxide (MAG-OX) 400 MG tablet Take 400 mg by mouth daily.    [provider]  Multiple Vitamins-Minerals (PRESERVISION AREDS 2+MULTI VIT) CAPS Take 2 capsules by mouth daily.    [provider]  nitroGLYCERIN  (NITROSTAT ) 0.4 MG SL tablet Place 1 tablet (0.4 mg total) under the tongue every 5 (five) minutes as needed (for chest pain or shortness of breath). 08/07/23 08/06/24   Morey Ar, NP  potassium chloride  (KLOR-CON ) 10 MEQ tablet TAKE 2 TABLETS BY MOUTH DAILY. TAKE EXTRA 2 TABLETS WHEN TAKING EXTRA LASIX . 07/20/23   Gollan, Timothy J, MD  pregabalin  (LYRICA ) 50 MG capsule TAKE 1 CAPSULE BY MOUTH 2 TIMES DAILY 08/04/23   Lamon Pillow, MD  rosuvastatin  (CRESTOR ) 20 MG tablet TAKE 1 TABLET BY MOUTH DAILY 09/04/23   Gollan, Timothy J, MD  Tiotropium Bromide -Olodaterol (STIOLTO RESPIMAT ) 2.5-2.5 MCG/ACT AERS Inhale 2 puffs into the lungs daily. Patient not taking: Reported on 09/28/2023 01/28/22   Charlette Console, FNP  torsemide  (DEMADEX ) 20 MG tablet Take 1 tablet (20 mg total) by mouth daily. You may take additional dose (20 mg tab) for 3 pound gain overnight or 5 pound gain in 1 week. 08/07/23   Morey Ar, NP  vitamin C  (ASCORBIC ACID ) 500 MG tablet Take 500 mg by mouth daily.    [provider]  vitamin E  100 UNIT capsule Take 200 Units by mouth daily.    [provider]  warfarin (COUMADIN ) 2.5 MG tablet TAKE 1 & 1/2 TABLETS DAILY EXCEPT TAKE ONE TABLET ON MONDAYS, WEDNESDAYS, AND FRIDAYS OR AS DIRECTED BY COUMADIN  CLINIC 10/26/23   Devorah Fonder, MD    Physical Exam: Vitals:   12/13/23 1632 12/13/23 1730 12/13/23 1830 12/13/23 1839  BP: (!) 157/64  (!) 136/57   Pulse:  67 80 79  Resp:  19 (!) 24 (!) 22  Temp:      TempSrc:      SpO2:  97% 100% 100%  Weight:      Height:       Physical Exam Vitals and nursing note reviewed.  HENT:     Head: Normocephalic and atraumatic.  Eyes:     Extraocular Movements: Extraocular movements intact.     Pupils: Pupils are equal, round, and reactive to light.  Neck:     Vascular: No JVD.  Cardiovascular:     Rate and Rhythm: Normal rate. Rhythm irregular.  Pulmonary:     Effort: Tachypnea and accessory muscle usage present.     Breath sounds: Wheezing (Diffuse expiratory wheezing heard throughout) present. No decreased breath sounds, rhonchi or rales.  Abdominal:      General: Bowel sounds are normal. There is no distension.     Palpations: Abdomen is soft.     Tenderness: There is no abdominal tenderness.  Musculoskeletal:  Right lower leg: No edema.     Left lower leg: No edema.  Skin:    General: Skin is warm and dry.  Neurological:     General: No focal deficit present.     Mental Status: She is alert and oriented to person, place, and time.  Psychiatric:        Mood and Affect: Mood normal.        Behavior: Behavior normal.    Data Reviewed: CBC with WBC of 15.2, hemoglobin of 11.9, MCV of 73, platelets of 180 BMP with sodium of 137, potassium 3.8, bicarb 23, glucose 118, BUN 21, creatinine 0.90, and GFR above 60 BNP 532 Troponin 12 Lactic acid 1.9 INR 2.2 COVID-19, influenza and RSV PCR negative  EKG personally reviewed.  Rate controlled atrial fibrillation with no acute ischemic changes.  DG Chest 2 View Result Date: 12/13/2023 EXAM: 2 VIEW(S) XRAY OF THE CHEST 12/13/2023 04:52:00 PM COMPARISON: 08/04/2023 CLINICAL HISTORY: SOB. R SOB. History of CHF. Recent 3 lb weight gain. Also having increased weakness. FINDINGS: LUNGS AND PLEURA: Mild subpleural scarring at the lung bases. Possible trace right pleural effusion. No consolidation. No pulmonary edema. No pneumothorax. HEART AND MEDIASTINUM: No acute abnormality of the cardiac and mediastinal silhouettes. BONES AND SOFT TISSUES: No acute osseous abnormality. IMPRESSION: 1. No acute cardiopulmonary pathology. 2. Possible trace right pleural effusion. Electronically signed by: Zadie Herter MD 12/13/2023 07:10 PM EDT RP Workstation: [id number]   Results are pending, will review when available.  Assessment and Plan:  * Acute on chronic hypoxic respiratory failure (HCC) Patient is presenting with 3 days of increased shortness of breath and productive cough, noted to be hypoxic at home requiring 2 L at rest.  Previously on 2 L only with exertion.  Her oxygen  saturation has been normal  while in the ER though.  Given diffuse wheezing and new cough, concern for asthma exacerbation.  However in the setting of elevated BNP and rapid weight gain, likely a component of heart failure as well although chest x-ray is reassuring.  - Continue supplemental oxygen  to maintain oxygen  saturation above 88% - Wean as tolerated to home 2 L only with exertion - Pulmonary toilet  Asthma, chronic obstructive, with acute exacerbation (HCC) History of mild intermittent asthma, with diffuse wheezing on examination today.  - Solu-Medrol  125 mg once - Start prednisone  40 mg tomorrow if patient is agreeable (hesitant due to prior RVR with continued steroid use) - RVP and procalcitonin pending - DuoNebs every 6 hours - Pulmicort  twice daily - Hold home bronchodilators  Acute on chronic heart failure with preserved ejection fraction (HFpEF) (HCC) History of HFpEF with last EF of 55-60% in the setting of worsening mitral stenosis, maintained on torsemide .  Patient feels her symptoms are due to hypervolemia given rapid weight gain over the past 24 hours.  BNP increased compared to prior.  - One-time dose of Lasix  40 mg IV - Reassess volume status tomorrow prior to additional doses - Daily weights - Strict in and out - Daily BMP  Mitral valve stenosis Most recent echocardiogram in April 2025 with moderate to severe mitral valve stenosis with peak gradient of 27.9 and mean gradient of 10.5.She follows with Dr. Gollan.  Paroxysmal atrial fibrillation (HCC) Rate controlled at this time.  - Continue home warfarin per pharmacy dosing and diltiazem   CAD (coronary artery disease) No chest pain reported at this time and negative troponin.  No ischemic EKG changes.  - Continue home Plavix , statin, Zetia   PAD (peripheral artery disease) (HCC) - Continue home Plavix , statin, Zetia   Hypothyroidism - Continue home regimen  Hypertension Well-controlled on diltiazem  only.  - Continue home  regimen  Advance Care Planning:   Code Status: Limited: Do not attempt resuscitation (DNR) -DNR-LIMITED -Do Not Intubate/DNI  confirmed by patient  Consults: None  Family Communication: No family at bedside  Severity of Illness: The appropriate patient status for this patient is OBSERVATION. Observation status is judged to be reasonable and necessary in order to provide the required intensity of service to ensure the patient's safety. The patient's presenting symptoms, physical exam findings, and initial radiographic and laboratory data in the context of their medical condition is felt to place them at decreased risk for further clinical deterioration. Furthermore, it is anticipated that the patient will be medically stable for discharge from the hospital within 2 midnights of admission.   Author: Avi Body, MD 12/13/2023 7:41 PM  For on call review [email or web address].

## 2023-12-13 NOTE — Assessment & Plan Note (Signed)
 Rate controlled at this time.  - Continue home warfarin per pharmacy dosing and diltiazem 

## 2023-12-14 ENCOUNTER — Telehealth (HOSPITAL_COMMUNITY): Payer: Self-pay | Admitting: Pharmacy Technician

## 2023-12-14 ENCOUNTER — Other Ambulatory Visit (HOSPITAL_COMMUNITY): Payer: Self-pay

## 2023-12-14 DIAGNOSIS — J9621 Acute and chronic respiratory failure with hypoxia: Secondary | ICD-10-CM | POA: Diagnosis not present

## 2023-12-14 DIAGNOSIS — J441 Chronic obstructive pulmonary disease with (acute) exacerbation: Secondary | ICD-10-CM | POA: Diagnosis not present

## 2023-12-14 LAB — RESPIRATORY PANEL BY PCR

## 2023-12-14 LAB — CBC
HCT: 35.7 % — ABNORMAL LOW (ref 36.0–46.0)
Hemoglobin: 11.5 g/dL — ABNORMAL LOW (ref 12.0–15.0)
MCH: 22.7 pg — ABNORMAL LOW (ref 26.0–34.0)
MCHC: 32.2 g/dL (ref 30.0–36.0)
MCV: 70.4 fL — ABNORMAL LOW (ref 80.0–100.0)
Platelets: 165 10*3/uL (ref 150–400)
RBC: 5.07 MIL/uL (ref 3.87–5.11)
RDW: 17.5 % — ABNORMAL HIGH (ref 11.5–15.5)
WBC: 12.9 10*3/uL — ABNORMAL HIGH (ref 4.0–10.5)
nRBC: 0 % (ref 0.0–0.2)

## 2023-12-14 LAB — BASIC METABOLIC PANEL WITH GFR
Anion gap: 11 (ref 5–15)
BUN: 23 mg/dL (ref 8–23)
CO2: 23 mmol/L (ref 22–32)
Calcium: 8.4 mg/dL — ABNORMAL LOW (ref 8.9–10.3)
Chloride: 102 mmol/L (ref 98–111)
Creatinine, Ser: 0.84 mg/dL (ref 0.44–1.00)
GFR, Estimated: >60 mL/min (ref >60)
Glucose, Bld: 270 mg/dL — ABNORMAL HIGH (ref 70–99)
Potassium: 3.3 mmol/L — ABNORMAL LOW (ref 3.5–5.1)
Sodium: 136 mmol/L (ref 135–145)

## 2023-12-14 LAB — PROTIME-INR
INR: 2.3 — ABNORMAL HIGH (ref 0.8–1.2)
Prothrombin Time: 25.6 s — ABNORMAL HIGH (ref 11.4–15.2)

## 2023-12-14 MED ORDER — IPRATROPIUM-ALBUTEROL 0.5-2.5 (3) MG/3ML IN SOLN
3.0000 mL | Freq: Four times a day (QID) | RESPIRATORY_TRACT | Status: DC | PRN
Start: 2023-12-14 — End: 2023-12-14

## 2023-12-14 MED ORDER — LEVALBUTEROL TARTRATE 45 MCG/ACT IN AERO: 2.0000 | INHALATION_SPRAY | RESPIRATORY_TRACT | 2 refills | Status: DC | PRN

## 2023-12-14 MED ORDER — LEVALBUTEROL TARTRATE 45 MCG/ACT IN AERO: 2.0000 | INHALATION_SPRAY | Freq: Four times a day (QID) | RESPIRATORY_TRACT | 0 refills | Status: DC | PRN

## 2023-12-14 MED ORDER — POTASSIUM CHLORIDE CRYS ER 20 MEQ PO TBCR
40.0000 meq | EXTENDED_RELEASE_TABLET | Freq: Once | ORAL | Status: AC
  Administered 2023-12-14: 40 meq via ORAL
  Filled 2023-12-14: qty 2

## 2023-12-14 MED ORDER — WARFARIN SODIUM 3 MG PO TABS
3.0000 mg | ORAL_TABLET | Freq: Once | ORAL | Status: DC
  Filled 2023-12-14: qty 1

## 2023-12-14 NOTE — Plan of Care (Signed)
 Pt alert; vitals stable; pt sleeping/resting,  no c/o pain through the night. Problem: Education: Goal: Knowledge of General Education information will improve Description: Including pain rating scale, medication(s)/side effects and non-pharmacologic comfort measures Outcome: Progressing   Problem: Health Behavior/Discharge Planning: Goal: Ability to manage health-related needs will improve Outcome: Progressing   Problem: Clinical Measurements: Goal: Ability to maintain clinical measurements within normal limits will improve Outcome: Progressing Goal: Will remain free from infection Outcome: Progressing Goal: Diagnostic test results will improve Outcome: Progressing Goal: Respiratory complications will improve Outcome: Progressing Goal: Cardiovascular complication will be avoided Outcome: Progressing   Problem: Activity: Goal: Risk for activity intolerance will decrease Outcome: Progressing   Problem: Nutrition: Goal: Adequate nutrition will be maintained Outcome: Progressing   Problem: Coping: Goal: Level of anxiety will decrease Outcome: Progressing   Problem: Elimination: Goal: Will not experience complications related to bowel motility Outcome: Progressing Goal: Will not experience complications related to urinary retention Outcome: Progressing   Problem: Pain Managment: Goal: General experience of comfort will improve and/or be controlled Outcome: Progressing   Problem: Safety: Goal: Ability to remain free from injury will improve Outcome: Progressing   Problem: Skin Integrity: Goal: Risk for impaired skin integrity will decrease Outcome: Progressing   Problem: Education: Goal: Knowledge of disease or condition will improve Outcome: Progressing Goal: Knowledge of the prescribed therapeutic regimen will improve Outcome: Progressing Goal: Individualized Educational Video(s) Outcome: Progressing   Problem: Activity: Goal: Ability to tolerate increased  activity will improve Outcome: Progressing Goal: Will verbalize the importance of balancing activity with adequate rest periods Outcome: Progressing   Problem: Respiratory: Goal: Ability to maintain a clear airway will improve Outcome: Progressing Goal: Levels of oxygenation will improve Outcome: Progressing Goal: Ability to maintain adequate ventilation will improve Outcome: Progressing

## 2023-12-14 NOTE — TOC Transition Note (Signed)
 Transition of Care [facility]) - Progression Note    Patient Details  Name: JONET HAUSLER MRN: [id number] Date of Birth: Dec 09, 1931  Transition of Care [facility]) CM/SW Contact  Baird Bombard, RN Phone Number: 12/14/2023, 11:36 AM  Clinical Narrative:    Spoke with patient regarding her discharge. Her daughter will transport her home today.   TOC signing off.          Expected Discharge Plan and Services         Expected Discharge Date: 12/14/23                                     Social Determinants of Health (SDOH) Interventions SDOH Screenings   Food Insecurity: No Food Insecurity (12/14/2023)  Housing: Low Risk  (12/14/2023)  Transportation Needs: No Transportation Needs (11/03/2022)  Utilities: Not At Risk (10/30/2022)  Alcohol Screen: Low Risk  (10/21/2022)  Depression (PHQ2-9): Medium Risk (08/03/2023)  Financial Resource Strain: Low Risk  (10/15/2021)  Physical Activity: Inactive (10/21/2022)  Social Connections: Moderately Isolated (10/21/2022)  Stress: No Stress Concern Present (10/21/2022)  Tobacco Use: Low Risk  (12/13/2023)    Readmission Risk Interventions    08/07/2021    1:24 PM  Readmission Risk Prevention Plan  Transportation Screening Complete  PCP or Specialist Appt within 3-5 Days Complete  HRI or Home Care Consult Complete  Social Work Consult for Recovery Care Planning/Counseling Complete  Palliative Care Screening Not Applicable  Medication Review Oceanographer) Complete

## 2023-12-14 NOTE — Progress Notes (Signed)
 PHARMACY - ANTICOAGULATION CONSULT NOTE  Pharmacy Consult for Warfarin Indication: atrial fibrillation  Allergies  Allergen Reactions   Shellfish Allergy Anaphylaxis    Other reaction(s): Hallucination   Azithromycin  Other (See Comments)    Extreme burning sensation at IV site    Tamiflu [Oseltamivir Phosphate] Other (See Comments)    Reaction:  Hallucinations     Albuterol  Palpitations    Heart racing.     Patient Measurements: Height: 5\' 5"  (165.1 cm) Weight: 54.3 kg (119 lb 11.4 oz) IBW/kg (Calculated) : 57 HEPARIN  DW (KG): 55.8  Vital Signs: Temp: 98.6 F (37 C) (04/27 2351) Temp Source: Oral (04/27 2351) BP: 131/59 (04/28 0538) Pulse Rate: 81 (04/28 0538)  Labs: Recent Labs    12/13/23 1633 12/13/23 1721 12/14/23 0531  HGB 11.9*  --  11.5*  HCT 39.0  --  35.7*  PLT 180  --  165  LABPROT  --  25.0* 25.6*  INR  --  2.2* 2.3*  CREATININE 0.90  --  0.84  TROPONINIHS 12  --   --     Estimated Creatinine Clearance: 38.2 mL/min (by C-G formula based on SCr of 0.84 mg/dL).   Medical History: Past Medical History:  Diagnosis Date   Aortic atherosclerosis (HCC)    a. 05/2021 TEE: GrIII atheroma plaque involving the asc, transverse, and desc Ao.   Asthma    CAD (coronary artery disease)    a. 04/2021 Cath: LM nl, LAD 85p/m, D1 80, RI nl, LCX nl, RCA nl; b. 07/2021 PCI: pLAD (2.75x26 Onyx Frontier DES), D1 (2.5x22 Onyx Frontier DES).   Carotid artery disease (HCC)    a. s/p R carotid stenting (9mm x 7mm x 4cm long Exact stent); b. 06/2021 U/S: RICA 40-59%, LICA 40-59%.   CHF (congestive heart failure) (HCC)    Community acquired pneumonia    Essential hypertension    Glaucoma    Hyperlipidemia    Mitral regurgitation    a. TTE 08/2015: EF 60-65%, normal wall motion, mild MR, mildly dilated left atrium measuring 40 mm, RVSF normal, PASP normal; b. 05/2021 TEE: Moderate MR.   Mitral stenosis    a. 05/2021 L/RHC: Sev MS w/ mean grad 13-58mmHg and MV area of  0.5-06.cm^2; b. 05/2021 TEE: EF 55-60%, no rwma, nl RV fxn, mod MR, mod MS (MV area by P1/2t: 1.61 cm^2 w/ mean grad of ).   Peripheral neuropathy    Persistent atrial fibrillation (HCC) 09/17/2015   a. s/p DCCV 11/15/2015; b. CHADS2VASc => 4 (HTN, age x 2, female)--> warfarin; c. 06/2021 recurrent afib-->amio added.   Squamous cell carcinoma of skin 12/18/2021   R dorsum hand, EDC   Squamous cell carcinoma of skin 12/31/2021   R hand dorsum, recurrent - excised 02/04/2022   Squamous cell carcinoma of skin 12/31/2021   L forearm - ED&C   Squamous cell carcinoma of skin 06/24/2022   R thumb webspace with wart virus - ED&C   Squamous cell carcinoma of skin 10/20/2022   L lat knee - tx with ED&C    Medications:  Warfarin 2.5 mg PO MWF, all other days 3.75 mg  Assessment: Patient is a 89 year old female who presented to ED for SOB. Patient has a history of CHF and has had a recent weight gain of 3 lbs. She is being admitted for acute on chronic hypoxic respiratory failure and acute on chronic heart failure with preserved ejection fraction. Severe mitral valve stenosis. Pharmacy has been consulted to dose her home warfarin  for a-fib while inpatient.   Home Dose: Warfarin 2.5 mg PO MWF, all other days 3.75 mg (has 2.5 mg tabs at home)   Date INR Warfarin Dose  4/27 2.2 3.75 mg   4/28 2.3 3 mg       Goal of Therapy:  INR 2-3, with mitral valve stenosis, will try to aim for a goal of 2.5 to 3.  Monitor platelets by anticoagulation protocol: Yes   Plan:  INR is therapeutic, but would like the INR to stay above 2.5. Will give 3 mg dose today. Daily INR. CBC at least every 3 days while on heparin .   Molly Hodges, PharmD Clinical Pharmacist  12/14/2023,8:20 AM

## 2023-12-14 NOTE — Discharge Instructions (Addendum)
 Please check your mychart results tomorrow to review your viral panel.  I recommend using your rescue inhaler if feeling short of breath..... especially if you are having viral symptoms like congestion, wheezing, running nose since an infection can cause your asthma to worsen.  Fortunately your exam does not look like you have heart failure exacerbation. I will have referral for heart failure clinical placed so you can follow up with them to prevent future issues.

## 2023-12-14 NOTE — Telephone Encounter (Signed)
 Patient Product/process development scientist completed.    The patient is insured through [employer]/ [employer]. Patient has Medicare and is not eligible for a copay card, but may be able to apply for patient assistance or Medicare RX Payment Plan (Patient Must reach out to their plan, if eligible for payment plan), if available.    Ran test claim for Farxiga 10 mg and the current 30 day co-pay is $0.00.  Ran test claim for Jardiance 10 mg and the current 30 day co-pay is $0.00.  This test claim was processed through [facility]- copay amounts may vary at other pharmacies due to pharmacy/plan contracts, or as the patient moves through the different stages of their insurance plan.     Roland Earl, CPHT Pharmacy Technician III Certified Patient Advocate [facility] Patient Advocate Team Direct Number: [phone]  Fax: [phone]

## 2023-12-14 NOTE — Evaluation (Signed)
 Physical Therapy Evaluation Patient Details Name: Molly Hodges MRN: [id number] DOB: 27-Sep-1931 Today's Date: 12/14/2023  History of Present Illness  Patient is a 89 year old female with acute on chronic hypoxic respiratory failure. History of chronic hypoxic respiratory failure on 2 L with exertion, severe mitral stenosis, HFpEF, atrial fibrillation, HTN, HLD, hypothyroidism, CAD  Clinical Impression  Patient is agreeable to PT evaluation. She lives with her spouse and has supportive family. She is usually independent with ambulation without device, but has DME at home and oxygen  to use PRN as needed.  Today the patient is Mod I for bed mobility. CGA provided for transfers. CGA for hallway ambulation, progressing to supervision using rolling walker. Mild dyspnea with exertion with Sp02 97% on room air while walking. PT will continue to follow to maximize independence and facilitate return to prior level of function.       If plan is discharge home, recommend the following: Assist for transportation;Assistance with cooking/housework   Can travel by private vehicle        Equipment Recommendations None recommended by PT  Recommendations for Other Services       Functional Status Assessment Patient has had a recent decline in their functional status and demonstrates the ability to make significant improvements in function in a reasonable and predictable amount of time.     Precautions / Restrictions Precautions Precautions: Fall Restrictions Weight Bearing Restrictions Per Provider Order: No      Mobility  Bed Mobility Overal bed mobility: Modified Independent                  Transfers Overall transfer level: Needs assistance Equipment used: Rolling walker (2 wheels) Transfers: Sit to/from Stand Sit to Stand: Contact guard assist           General transfer comment: CGA for safety    Ambulation/Gait Ambulation/Gait assistance: Contact guard assist,  Supervision Gait Distance (Feet): 75 Feet Assistive device: Rolling walker (2 wheels) Gait Pattern/deviations: Step-through pattern Gait velocity: decreased     General Gait Details: slow but steady with ambulation using 2 wheeled walker. Sp02 97% on room air with walking with dyspnea with exertion  Stairs            Wheelchair Mobility     Tilt Bed    Modified Rankin (Stroke Patients Only)       Balance Overall balance assessment: Needs assistance Sitting-balance support: Feet supported Sitting balance-Leahy Scale: Good     Standing balance support: Bilateral upper extremity supported Standing balance-Leahy Scale: Poor Standing balance comment: relying on rolling walker for support in standing                             Pertinent Vitals/Pain Pain Assessment Pain Assessment: No/denies pain    Home Living Family/patient expects to be discharged to:: Private residence Living Arrangements: Spouse/significant other Available Help at Discharge: Family;Available PRN/intermittently Type of Home: House Home Access: Level entry       Home Layout: One level Home Equipment: Rollator (4 wheels);Rolling Walker (2 wheels);Wheelchair - manual Additional Comments: PRN 02    Prior Function Prior Level of Function : Independent/Modified Independent             Mobility Comments: usually no device for ambulation but intermittent use of 2 wheeled walker with dyspnea       Extremity/Trunk Assessment   Upper Extremity Assessment Upper Extremity Assessment: Defer to OT evaluation  Lower Extremity Assessment Lower Extremity Assessment: Generalized weakness (endurance impaired for sustained activity)       Communication   Communication Communication: No apparent difficulties    Cognition Arousal: Alert Behavior During Therapy: WFL for tasks assessed/performed   PT - Cognitive impairments: No apparent impairments                        PT - Cognition Comments: verbose Following commands: Intact       Cueing Cueing Techniques: Verbal cues     General Comments      Exercises     Assessment/Plan    PT Assessment Patient needs continued PT services  PT Problem List Decreased strength;Decreased range of motion;Decreased activity tolerance;Decreased balance;Decreased mobility;Cardiopulmonary status limiting activity       PT Treatment Interventions DME instruction;Gait training;Functional mobility training;Therapeutic activities;Therapeutic exercise;Balance training;Neuromuscular re-education;Patient/family education    PT Goals (Current goals can be found in the Care Plan section)  Acute Rehab PT Goals Patient Stated Goal: to feel better and return home PT Goal Formulation: With patient Time For Goal Achievement: 12/28/23 Potential to Achieve Goals: Good    Frequency Min 2X/week     Co-evaluation PT/OT/SLP Co-Evaluation/Treatment: Yes Reason for Co-Treatment: To address functional/ADL transfers PT goals addressed during session: Mobility/safety with mobility OT goals addressed during session: ADL's and self-care       AM-PAC PT "6 Clicks" Mobility  Outcome Measure Help needed turning from your back to your side while in a flat bed without using bedrails?: None Help needed moving from lying on your back to sitting on the side of a flat bed without using bedrails?: None Help needed moving to and from a bed to a chair (including a wheelchair)?: A Little Help needed standing up from a chair using your arms (e.g., wheelchair or bedside chair)?: A Little Help needed to walk in hospital room?: A Little Help needed climbing 3-5 steps with a railing? : A Little 6 Click Score: 20    End of Session Equipment Utilized During Treatment: Gait belt Activity Tolerance: Patient tolerated treatment well Patient left: in chair;with call bell/phone within reach;with chair alarm set Nurse Communication: Mobility  status PT Visit Diagnosis: Muscle weakness (generalized) (M62.81);Unsteadiness on feet (R26.81)    Time: 5621-3086 PT Time Calculation (min) (ACUTE ONLY): 22 min   Charges:   PT Evaluation $PT Eval Low Complexity: 1 Low   PT General Charges $$ ACUTE PT VISIT: 1 Visit         Ozie Bo, PT, MPT   Erlene Hawks 12/14/2023, 10:14 AM

## 2023-12-14 NOTE — Discharge Summary (Signed)
 Physician Discharge Summary  Patient: Molly Hodges LKG:[id number] DOB: Jul 01, 1932   Code Status: Limited: Do not attempt resuscitation (DNR) -DNR-LIMITED -Do Not Intubate/DNI  Admit date: 12/13/2023 Discharge date: 12/14/2023 Disposition: Home, No home health services recommended PCP: Lamon Pillow, MD  Recommendations for Outpatient Follow-up:  Follow up with PCP within 1-2 weeks Regarding general hospital follow up and preventative care Follow up with heart failure clinic  Discharge Diagnoses:  Principal Problem:   Acute on chronic hypoxic respiratory failure (HCC) Active Problems:   Asthma, chronic obstructive, with acute exacerbation (HCC)   Acute on chronic heart failure with preserved ejection fraction (HFpEF) (HCC)   Mitral valve stenosis   Paroxysmal atrial fibrillation (HCC)   Hypertension   Hypothyroidism   PAD (peripheral artery disease) (HCC)   CAD (coronary artery disease)  Brief Hospital Course Summary: Molly Hodges is a 89 y.o. female with medical history significant of asthma complicated by chronic hypoxic respiratory failure on 2 L with exertion, severe mitral stenosis, HFpEF, atrial fibrillation on warfarin, hypertension, hyperlipidemia, hypothyroidism, CAD s/p PCI to proximal LAD (2022), carotid artery disease s/p right carotid stenting, who presents to the ED due to shortness of breath for approximately 3 days, and increasing productive cough.  She denies any orthopnea, lower extremity swelling or abdominal distention though.  She denies any known sick contacts, fever, chills.   ED course: hypertensive at 157/64 with heart rate of 67.  She was saturating at 97% on room air but subsequently placed on 2 L.  She was tachypneic.  She was afebrile at 98. Initial workup notable for WBC of 15.2, hemoglobin 11.9, and unremarkable BMP.  B NP elevated at 532.  Troponin and lactic acid within normal limits.  COVID-19, influenza and RSV PCR negative.  INR 2.2.   Chest x-ray with no acute cardiopulmonary pathology but possible trace right pleural effusion.  Patient started on Lasix  and [facility] contacted for admission.  Her presentation was most consistent with asthma exacerbation which improved with inhalers/nebulizers. She was able to wean to room air and with ambulation O2 sats remained >92%. Her clinical status appeared to be euvolemic.  RVP panel results positive for rhinovirus which is likely what exacerbated her reactive airway disease. Levalbuterol  inhaler was prescribed so she can rescue inhaler options at home.  Heart failure clinic referral placed at dc per family request.   All other chronic conditions were treated with home medications.    Discharge Condition: Good, improved Recommended discharge diet: Regular healthy diet  Consultations: None   Procedures/Studies: None   Discharge Instructions     AMB referral to CHF clinic   Complete by: As directed    Reason for referral: Systolic HF      Allergies as of 12/14/2023       Reactions   Shellfish Allergy Anaphylaxis   Other reaction(s): Hallucination   Azithromycin  Other (See Comments)   Extreme burning sensation at IV site   Tamiflu [oseltamivir Phosphate] Other (See Comments)   Reaction:  Hallucinations    Albuterol  Palpitations   Heart racing.         Medication List     STOP taking these medications    Stiolto Respimat  2.5-2.5 MCG/ACT Aers Generic drug: Tiotropium Bromide -Olodaterol       TAKE these medications    arformoterol  15 MCG/2ML Nebu Commonly known as: Brovana  Take 2 mLs (15 mcg total) by nebulization 2 (two) times daily.   ascorbic acid  500 MG tablet Commonly known as:  VITAMIN C  Take 500 mg by mouth daily.   budesonide  0.5 MG/2ML nebulizer solution Commonly known as: PULMICORT  Take 2 mLs (0.5 mg total) by nebulization 2 (two) times daily.   Calcium  Carbonate-Vitamin D  600-400 MG-UNIT tablet Take 1 tablet by mouth daily.   cholecalciferol   1000 units tablet Commonly known as: VITAMIN D  Take 1,000 Units by mouth daily.   clopidogrel  75 MG tablet Commonly known as: PLAVIX  TAKE 1 TABLET BY MOUTH DAILY   diltiazem  120 MG 24 hr capsule Commonly known as: CARDIZEM  CD Take 1 capsule (120 mg total) by mouth daily.   dorzolamide  2 % ophthalmic solution Commonly known as: TRUSOPT  Place 1 drop into both eyes 2 (two) times daily.   ezetimibe  10 MG tablet Commonly known as: ZETIA  TAKE 1 TABLET BY MOUTH DAILY   ipratropium-albuterol  0.5-2.5 (3) MG/3ML Soln Commonly known as: DUONEB TAKE BY NEBULIZATION EVERY 4 HOURS AS NEEDED   latanoprost  0.005 % ophthalmic solution Commonly known as: XALATAN  Place 1 drop into both eyes at bedtime.   levalbuterol  45 MCG/ACT inhaler Commonly known as: XOPENEX  HFA Inhale 2 puffs into the lungs every 4 (four) hours as needed for wheezing. What changed: You were already taking a medication with the same name, and this prescription was added. Make sure you understand how and when to take each.   levalbuterol  45 MCG/ACT inhaler Commonly known as: Xopenex  HFA Inhale 2 puffs into the lungs every 6 (six) hours as needed for wheezing or shortness of breath. What changed: Another medication with the same name was added. Make sure you understand how and when to take each.   magnesium  oxide 400 MG tablet Commonly known as: MAG-OX Take 400 mg by mouth daily.   nitroGLYCERIN  0.4 MG SL tablet Commonly known as: Nitrostat  Place 1 tablet (0.4 mg total) under the tongue every 5 (five) minutes as needed (for chest pain or shortness of breath).   potassium chloride  10 MEQ tablet Commonly known as: KLOR-CON  TAKE 2 TABLETS BY MOUTH DAILY. TAKE EXTRA 2 TABLETS WHEN TAKING EXTRA LASIX .   pregabalin  50 MG capsule Commonly known as: LYRICA  TAKE 1 CAPSULE BY MOUTH 2 TIMES DAILY   PreserVision AREDS 2+Multi Vit Caps Take 2 capsules by mouth daily.   rosuvastatin  20 MG tablet Commonly known as:  CRESTOR  TAKE 1 TABLET BY MOUTH DAILY   torsemide  20 MG tablet Commonly known as: DEMADEX  Take 1 tablet (20 mg total) by mouth daily. You may take additional dose (20 mg tab) for 3 pound gain overnight or 5 pound gain in 1 week.   vitamin E  45 MG (100 UNITS) capsule Take 200 Units by mouth daily.   warfarin 2.5 MG tablet Commonly known as: COUMADIN  Take as directed. If you are unsure how to take this medication, talk to your nurse or doctor. Original instructions: TAKE 1 & 1/2 TABLETS DAILY EXCEPT TAKE ONE TABLET ON MONDAYS, WEDNESDAYS, AND FRIDAYS OR AS DIRECTED BY COUMADIN  CLINIC        Follow-up Information     Lamon Pillow, MD. Schedule an appointment as soon as possible for a visit in 1 week(s).   Specialty: Family Medicine Contact information: [address] [place] [address] [place] [address] [phone]                Subjective   Pt reports feeling well. No SOB, CP, chest tightness. Denies leg swelling.   All questions and concerns were addressed at time of discharge.  Objective  Blood pressure Aaron Aas)  119/42, pulse 81, temperature 98.5 F (36.9 C), resp. rate 20, height 5\' 5"  (1.651 m), weight 54.3 kg, SpO2 97%.   General: Pt is alert, awake, not in acute distress Cardiovascular: RRR, S1/S2 +, no rubs, no gallops Respiratory: CTA bilaterally, no wheezing, no rhonchi Abdominal: Soft, NT, ND, bowel sounds + Extremities: no edema, no cyanosis  The results of significant diagnostics from this hospitalization (including imaging, microbiology, ancillary and laboratory) are listed below for reference.   Imaging studies: DG Chest 2 View Result Date: 12/13/2023 EXAM: 2 VIEW(S) XRAY OF THE CHEST 12/13/2023 04:52:00 PM COMPARISON: 08/04/2023 CLINICAL HISTORY: SOB. R SOB. History of CHF. Recent 3 lb weight gain. Also having increased weakness. FINDINGS: LUNGS AND PLEURA: Mild subpleural scarring at the lung bases. Possible trace right pleural effusion. No  consolidation. No pulmonary edema. No pneumothorax. HEART AND MEDIASTINUM: No acute abnormality of the cardiac and mediastinal silhouettes. BONES AND SOFT TISSUES: No acute osseous abnormality. IMPRESSION: 1. No acute cardiopulmonary pathology. 2. Possible trace right pleural effusion. Electronically signed by: Zadie Herter MD 12/13/2023 07:10 PM EDT RP Workstation: [id number]    Labs: Basic Metabolic Panel: Recent Labs  Lab 12/13/23 1633 12/14/23 0531  NA 137 136  K 3.8 3.3*  CL 104 102  CO2 23 23  GLUCOSE 118* 270*  BUN 21 23  CREATININE 0.90 0.84  CALCIUM  8.3* 8.4*   CBC: Recent Labs  Lab 12/13/23 1633 12/14/23 0531  WBC 15.2* 12.9*  HGB 11.9* 11.5*  HCT 39.0 35.7*  MCV 73.6* 70.4*  PLT 180 165   Microbiology: Results for orders placed or performed during the hospital encounter of 12/13/23  Resp panel by RT-PCR (RSV, Flu A&B, Covid) Anterior Nasal Swab     Status: None   Collection Time: 12/13/23  5:21 PM   Specimen: Anterior Nasal Swab  Result Value Ref Range Status   SARS Coronavirus 2 by RT PCR NEGATIVE NEGATIVE Final    Comment: (NOTE) SARS-CoV-2 target nucleic acids are NOT DETECTED.  The SARS-CoV-2 RNA is generally detectable in upper respiratory specimens during the acute phase of infection. The lowest concentration of SARS-CoV-2 viral copies this assay can detect is 138 copies/mL. A negative result does not preclude SARS-Cov-2 infection and should not be used as the sole basis for treatment or other patient management decisions. A negative result may occur with  improper specimen collection/handling, submission of specimen other than nasopharyngeal swab, presence of viral mutation(s) within the areas targeted by this assay, and inadequate number of viral copies(<138 copies/mL). A negative result must be combined with clinical observations, patient history, and epidemiological information. The expected result is Negative.  Fact Sheet for Patients:   BloggerCourse.com  Fact Sheet for Healthcare Providers:  SeriousBroker.it  This test is no t yet approved or cleared by the [place]  FDA and  has been authorized for detection and/or diagnosis of SARS-CoV-2 by FDA under an Emergency Use Authorization (EUA). This EUA will remain  in effect (meaning this test can be used) for the duration of the COVID-19 declaration under Section 564(b)(1) of the Act, 21 U.S.C.section 360bbb-3(b)(1), unless the authorization is terminated  or revoked sooner.       Influenza A by PCR NEGATIVE NEGATIVE Final   Influenza B by PCR NEGATIVE NEGATIVE Final    Comment: (NOTE) The Xpert Xpress SARS-CoV-2/FLU/RSV plus assay is intended as an aid in the diagnosis of influenza from Nasopharyngeal swab specimens and should not be used as a sole basis for treatment. Nasal washings and  aspirates are unacceptable for Xpert Xpress SARS-CoV-2/FLU/RSV testing.  Fact Sheet for Patients: BloggerCourse.com  Fact Sheet for Healthcare Providers: SeriousBroker.it  This test is not yet approved or cleared by the [place]  FDA and has been authorized for detection and/or diagnosis of SARS-CoV-2 by FDA under an Emergency Use Authorization (EUA). This EUA will remain in effect (meaning this test can be used) for the duration of the COVID-19 declaration under Section 564(b)(1) of the Act, 21 U.S.C. section 360bbb-3(b)(1), unless the authorization is terminated or revoked.     Resp Syncytial Virus by PCR NEGATIVE NEGATIVE Final    Comment: (NOTE) Fact Sheet for Patients: BloggerCourse.com  Fact Sheet for Healthcare Providers: SeriousBroker.it  This test is not yet approved or cleared by the [place]  FDA and has been authorized for detection and/or diagnosis of SARS-CoV-2 by FDA under an Emergency Use  Authorization (EUA). This EUA will remain in effect (meaning this test can be used) for the duration of the COVID-19 declaration under Section 564(b)(1) of the Act, 21 U.S.C. section 360bbb-3(b)(1), unless the authorization is terminated or revoked.  Performed at [facility], [address] [place], [place] [address]   Blood culture (routine x 2)     Status: None (Preliminary result)   Collection Time: 12/13/23  5:21 PM   Specimen: BLOOD  Result Value Ref Range Status   Specimen Description BLOOD RIGHT ANTECUBITAL  Final   Special Requests   Final    BOTTLES DRAWN AEROBIC AND ANAEROBIC Blood Culture results may not be optimal due to an inadequate volume of blood received in culture bottles   Culture   Final    NO GROWTH < 24 HOURS Performed at [facility], [address] [place], [place] [address]    Report Status PENDING  Incomplete  Blood culture (routine x 2)     Status: None (Preliminary result)   Collection Time: 12/13/23  8:43 PM   Specimen: BLOOD  Result Value Ref Range Status   Specimen Description BLOOD BLOOD LEFT ARM  Final   Special Requests   Final    BOTTLES DRAWN AEROBIC AND ANAEROBIC Blood Culture adequate volume   Culture   Final    NO GROWTH < 12 HOURS Performed at [facility], [address] [place], [place] [address]    Report Status PENDING  Incomplete    Time coordinating discharge: Over 30 minutes  Ree Candy, MD  [facility] 12/14/2023, 11:32 AM

## 2023-12-14 NOTE — Evaluation (Signed)
 Occupational Therapy Evaluation Patient Details Name: Molly Hodges MRN: [id number] DOB: [birth year] Today's Date: 12/14/2023   History of Present Illness   Patient is a 89 year old female with acute on chronic hypoxic respiratory failure. History of chronic hypoxic respiratory failure on 2 L with exertion, severe mitral stenosis, HFpEF, atrial fibrillation, HTN, HLD, hypothyroidism, CAD     Clinical Impressions PTA, pt lives with spouse and is typically independent in ADLs/mobility with intermittent use of RW when fatigued. Pt requires CGA fading to supervision for functional transfers and mobility, stands at sink to perform oral care with unilateral hand support, and requires 1 seated recovery break due to coughing. Has necessary DME and O2 at home. Pt would benefit from skilled OT services to address noted impairments and functional limitations (see below for any additional details) in order to maximize safety and independence while minimizing falls risk and caregiver burden. Do not anticipate the need for follow up OT services upon acute hospital DC.      If plan is discharge home, recommend the following:   A little help with walking and/or transfers;A little help with bathing/dressing/bathroom;Assist for transportation     Functional Status Assessment   Patient has had a recent decline in their functional status and demonstrates the ability to make significant improvements in function in a reasonable and predictable amount of time.     Equipment Recommendations   None recommended by OT      Precautions/Restrictions   Precautions Precautions: Fall Restrictions Weight Bearing Restrictions Per Provider Order: No     Mobility Bed Mobility Overal bed mobility: Modified Independent                  Transfers Overall transfer level: Needs assistance Equipment used: Rolling walker (2 wheels) Transfers: Sit to/from Stand Sit to Stand: Contact guard assist            General transfer comment: CGA progressing to supervision      Balance Overall balance assessment: Needs assistance Sitting-balance support: Feet supported Sitting balance-Leahy Scale: Good     Standing balance support: Bilateral upper extremity supported Standing balance-Leahy Scale: Poor Standing balance comment: relying on sink                           ADL either performed or assessed with clinical judgement   ADL Overall ADL's : Needs assistance/impaired     Grooming: Oral care;Standing;Supervision/safety;Set up Grooming Details (indicate cue type and reason): short coughing bout requring seated rest break, no LOB, able to locate items and OT assisted with opening plastic on toothbrush Upper Body Bathing: Set up;Sitting   Lower Body Bathing: Supervison/ safety;Sit to/from stand           Toilet Transfer: Supervision/safety   Toileting- Architect and Hygiene: Supervision/safety;Sit to/from stand       Functional mobility during ADLs: Contact guard assist;Supervision/safety       Vision Baseline Vision/History: 1 Wears glasses Ability to See in Adequate Light: 0 Adequate Patient Visual Report: No change from baseline Additional Comments: mac degeneration L eye            Pertinent Vitals/Pain Pain Assessment Pain Assessment: No/denies pain     Extremity/Trunk Assessment Upper Extremity Assessment Upper Extremity Assessment: Generalized weakness;Overall [facility] for tasks assessed   Lower Extremity Assessment Lower Extremity Assessment: Defer to PT evaluation       Communication Communication Communication: No apparent difficulties   Cognition Arousal:  Alert Behavior During Therapy: [facility] for tasks assessed/performed Cognition: No apparent impairments                               Following commands: Intact       Cueing  General Comments   Cueing Techniques: Verbal cues  On RA. O2 97%            Home Living Family/patient expects to be discharged to:: Private residence Living Arrangements: Spouse/significant other Available Help at Discharge: Family;Available PRN/intermittently Type of Home: House Home Access: Level entry     Home Layout: One level     Bathroom Shower/Tub: Chief Strategy Officer: Standard     Home Equipment: Rollator (4 wheels);Rolling Walker (2 wheels);Wheelchair - manual   Additional Comments: PRN 02      Prior Functioning/Environment Prior Level of Function : Independent/Modified Independent             Mobility Comments: usually no device for ambulation but intermittent use of 2 wheeled walker with dyspnea ADLs Comments: independent    OT Problem List: Decreased strength;Decreased activity tolerance;Impaired balance (sitting and/or standing);Cardiopulmonary status limiting activity   OT Treatment/Interventions: Self-care/ADL training;Therapeutic exercise;Neuromuscular education;Visual/perceptual remediation/compensation;Patient/family education;Energy conservation;Balance training      OT Goals(Current goals can be found in the care plan section)   Acute Rehab OT Goals OT Goal Formulation: With patient Time For Goal Achievement: 12/28/23 Potential to Achieve Goals: Good   OT Frequency:  Min 2X/week    Co-evaluation PT/OT/SLP Co-Evaluation/Treatment: Yes Reason for Co-Treatment: To address functional/ADL transfers PT goals addressed during session: Mobility/safety with mobility OT goals addressed during session: ADL's and self-care      AM-PAC OT "6 Clicks" Daily Activity     Outcome Measure Help from another person eating meals?: None Help from another person taking care of personal grooming?: None Help from another person toileting, which includes using toliet, bedpan, or urinal?: A Little Help from another person bathing (including washing, rinsing, drying)?: None Help from another person to put on and  taking off regular upper body clothing?: A Little Help from another person to put on and taking off regular lower body clothing?: A Little 6 Click Score: 21   End of Session Equipment Utilized During Treatment: Gait belt;Rolling walker (2 wheels) Nurse Communication: Mobility status  Activity Tolerance: Patient tolerated treatment well Patient left: in chair;with call bell/phone within reach;with chair alarm set  OT Visit Diagnosis: Unsteadiness on feet (R26.81);Other abnormalities of gait and mobility (R26.89);Muscle weakness (generalized) (M62.81)                Time: 4540-9811 OT Time Calculation (min): 33 min Charges:  OT General Charges $OT Visit: 1 Visit OT Evaluation $OT Eval Low Complexity: 1 Low Aaralyn Kil L. Teretha Chalupa, OTR/L  12/14/23, 12:57 PM

## 2023-12-14 NOTE — Care Management Obs Status (Signed)
 MEDICARE OBSERVATION STATUS NOTIFICATION   Patient Details  Name: Molly Hodges MRN: [id number] Date of Birth: 1932/05/28   Medicare Observation Status Notification Given:  Yes    Anise Kerns 12/14/2023, 2:32 PM

## 2023-12-15 ENCOUNTER — Ambulatory Visit: Payer: Self-pay

## 2023-12-15 ENCOUNTER — Emergency Department

## 2023-12-15 ENCOUNTER — Other Ambulatory Visit: Payer: Self-pay

## 2023-12-15 ENCOUNTER — Inpatient Hospital Stay
Admission: EM | Admit: 2023-12-15 | Discharge: 2023-12-23 | DRG: 191 | Disposition: A | Discharge status: Home Health | Attending: Obstetrics and Gynecology | Admitting: Obstetrics and Gynecology

## 2023-12-15 ENCOUNTER — Other Ambulatory Visit (INDEPENDENT_AMBULATORY_CARE_PROVIDER_SITE_OTHER): Payer: Self-pay | Admitting: Vascular Surgery

## 2023-12-15 DIAGNOSIS — Z7901 Long term (current) use of anticoagulants: Secondary | ICD-10-CM

## 2023-12-15 DIAGNOSIS — R0602 Shortness of breath: Secondary | ICD-10-CM | POA: Diagnosis not present

## 2023-12-15 DIAGNOSIS — J929 Pleural plaque without asbestos: Secondary | ICD-10-CM | POA: Diagnosis not present

## 2023-12-15 DIAGNOSIS — I700 Atherosclerosis of aorta: Secondary | ICD-10-CM | POA: Diagnosis present

## 2023-12-15 DIAGNOSIS — I4891 Unspecified atrial fibrillation: Secondary | ICD-10-CM | POA: Diagnosis not present

## 2023-12-15 DIAGNOSIS — Z85828 Personal history of other malignant neoplasm of skin: Secondary | ICD-10-CM

## 2023-12-15 DIAGNOSIS — J449 Chronic obstructive pulmonary disease, unspecified: Secondary | ICD-10-CM | POA: Diagnosis not present

## 2023-12-15 DIAGNOSIS — I6523 Occlusion and stenosis of bilateral carotid arteries: Secondary | ICD-10-CM

## 2023-12-15 DIAGNOSIS — J209 Acute bronchitis, unspecified: Secondary | ICD-10-CM | POA: Diagnosis present

## 2023-12-15 DIAGNOSIS — J441 Chronic obstructive pulmonary disease with (acute) exacerbation: Secondary | ICD-10-CM | POA: Diagnosis not present

## 2023-12-15 DIAGNOSIS — Z888 Allergy status to other drugs, medicaments and biological substances status: Secondary | ICD-10-CM

## 2023-12-15 DIAGNOSIS — J9621 Acute and chronic respiratory failure with hypoxia: Secondary | ICD-10-CM | POA: Diagnosis present

## 2023-12-15 DIAGNOSIS — I4819 Other persistent atrial fibrillation: Secondary | ICD-10-CM | POA: Diagnosis present

## 2023-12-15 DIAGNOSIS — Z801 Family history of malignant neoplasm of trachea, bronchus and lung: Secondary | ICD-10-CM

## 2023-12-15 DIAGNOSIS — I2510 Atherosclerotic heart disease of native coronary artery without angina pectoris: Secondary | ICD-10-CM | POA: Diagnosis present

## 2023-12-15 DIAGNOSIS — T380X5A Adverse effect of glucocorticoids and synthetic analogues, initial encounter: Secondary | ICD-10-CM | POA: Diagnosis present

## 2023-12-15 DIAGNOSIS — Z7902 Long term (current) use of antithrombotics/antiplatelets: Secondary | ICD-10-CM

## 2023-12-15 DIAGNOSIS — J9611 Chronic respiratory failure with hypoxia: Secondary | ICD-10-CM | POA: Diagnosis present

## 2023-12-15 DIAGNOSIS — E785 Hyperlipidemia, unspecified: Secondary | ICD-10-CM | POA: Diagnosis present

## 2023-12-15 DIAGNOSIS — J45901 Unspecified asthma with (acute) exacerbation: Secondary | ICD-10-CM | POA: Diagnosis present

## 2023-12-15 DIAGNOSIS — E039 Hypothyroidism, unspecified: Secondary | ICD-10-CM | POA: Diagnosis present

## 2023-12-15 DIAGNOSIS — R7303 Prediabetes: Secondary | ICD-10-CM | POA: Diagnosis present

## 2023-12-15 DIAGNOSIS — Z91013 Allergy to seafood: Secondary | ICD-10-CM

## 2023-12-15 DIAGNOSIS — Z79899 Other long term (current) drug therapy: Secondary | ICD-10-CM

## 2023-12-15 DIAGNOSIS — Z7951 Long term (current) use of inhaled steroids: Secondary | ICD-10-CM

## 2023-12-15 DIAGNOSIS — Z955 Presence of coronary angioplasty implant and graft: Secondary | ICD-10-CM | POA: Diagnosis not present

## 2023-12-15 DIAGNOSIS — I050 Rheumatic mitral stenosis: Secondary | ICD-10-CM | POA: Diagnosis present

## 2023-12-15 DIAGNOSIS — Z66 Do not resuscitate: Secondary | ICD-10-CM | POA: Diagnosis present

## 2023-12-15 DIAGNOSIS — I517 Cardiomegaly: Secondary | ICD-10-CM | POA: Diagnosis not present

## 2023-12-15 DIAGNOSIS — G629 Polyneuropathy, unspecified: Secondary | ICD-10-CM | POA: Diagnosis present

## 2023-12-15 DIAGNOSIS — Z8249 Family history of ischemic heart disease and other diseases of the circulatory system: Secondary | ICD-10-CM

## 2023-12-15 DIAGNOSIS — R590 Localized enlarged lymph nodes: Secondary | ICD-10-CM | POA: Diagnosis not present

## 2023-12-15 DIAGNOSIS — I5032 Chronic diastolic (congestive) heart failure: Secondary | ICD-10-CM | POA: Diagnosis present

## 2023-12-15 DIAGNOSIS — R051 Acute cough: Secondary | ICD-10-CM | POA: Diagnosis not present

## 2023-12-15 DIAGNOSIS — Z9849 Cataract extraction status, unspecified eye: Secondary | ICD-10-CM

## 2023-12-15 DIAGNOSIS — I110 Hypertensive heart disease with heart failure: Secondary | ICD-10-CM | POA: Diagnosis present

## 2023-12-15 DIAGNOSIS — J4521 Mild intermittent asthma with (acute) exacerbation: Secondary | ICD-10-CM | POA: Diagnosis present

## 2023-12-15 DIAGNOSIS — I739 Peripheral vascular disease, unspecified: Secondary | ICD-10-CM | POA: Diagnosis present

## 2023-12-15 LAB — CBC
HCT: 37.7 % (ref 36.0–46.0)
Hemoglobin: 11.9 g/dL — ABNORMAL LOW (ref 12.0–15.0)
MCH: 22.7 pg — ABNORMAL LOW (ref 26.0–34.0)
MCHC: 31.6 g/dL (ref 30.0–36.0)
MCV: 71.8 fL — ABNORMAL LOW (ref 80.0–100.0)
Platelets: 223 10*3/uL (ref 150–400)
RBC: 5.25 MIL/uL — ABNORMAL HIGH (ref 3.87–5.11)
RDW: 17.8 % — ABNORMAL HIGH (ref 11.5–15.5)
WBC: 17.6 10*3/uL — ABNORMAL HIGH (ref 4.0–10.5)
nRBC: 0 % (ref 0.0–0.2)

## 2023-12-15 LAB — BASIC METABOLIC PANEL WITH GFR
Anion gap: 13 (ref 5–15)
BUN: 39 mg/dL — ABNORMAL HIGH (ref 8–23)
CO2: 24 mmol/L (ref 22–32)
Calcium: 8.6 mg/dL — ABNORMAL LOW (ref 8.9–10.3)
Chloride: 99 mmol/L (ref 98–111)
Creatinine, Ser: 0.94 mg/dL (ref 0.44–1.00)
GFR, Estimated: 58 mL/min — ABNORMAL LOW (ref >60)
Glucose, Bld: 123 mg/dL — ABNORMAL HIGH (ref 70–99)
Potassium: 4.2 mmol/L (ref 3.5–5.1)
Sodium: 136 mmol/L (ref 135–145)

## 2023-12-15 LAB — BRAIN NATRIURETIC PEPTIDE: B Natriuretic Peptide: 393.5 pg/mL — ABNORMAL HIGH (ref 0.0–100.0)

## 2023-12-15 LAB — PROTIME-INR
INR: 2.8 — ABNORMAL HIGH (ref 0.8–1.2)
Prothrombin Time: 29.6 s — ABNORMAL HIGH (ref 11.4–15.2)

## 2023-12-15 LAB — LACTIC ACID, PLASMA: Lactic Acid, Venous: 1.5 mmol/L (ref 0.5–1.9)

## 2023-12-15 MED ORDER — ONDANSETRON HCL 4 MG/2ML IJ SOLN: 4.0000 mg | Freq: Four times a day (QID) | INTRAMUSCULAR | Status: DC | PRN

## 2023-12-15 MED ORDER — HYDROCODONE-ACETAMINOPHEN 5-325 MG PO TABS
1.0000 | ORAL_TABLET | ORAL | Status: DC | PRN
  Administered 2023-12-19 (×2): 2 via ORAL
  Administered 2023-12-21 (×2): 1 via ORAL
  Filled 2023-12-15: qty 2
  Filled 2023-12-15: qty 1
  Filled 2023-12-15: qty 2
  Filled 2023-12-15: qty 1

## 2023-12-15 MED ORDER — METHYLPREDNISOLONE SODIUM SUCC 125 MG IJ SOLR
125.0000 mg | INTRAMUSCULAR | Status: AC
  Administered 2023-12-15: 125 mg via INTRAVENOUS
  Filled 2023-12-15: qty 2

## 2023-12-15 MED ORDER — ROSUVASTATIN CALCIUM 10 MG PO TABS
20.0000 mg | ORAL_TABLET | Freq: Every day | ORAL | Status: DC
  Administered 2023-12-16 – 2023-12-23 (×8): 20 mg via ORAL
  Filled 2023-12-15 (×3): qty 2
  Filled 2023-12-15: qty 1
  Filled 2023-12-15 (×4): qty 2

## 2023-12-15 MED ORDER — PREGABALIN 50 MG PO CAPS
50.0000 mg | ORAL_CAPSULE | Freq: Two times a day (BID) | ORAL | Status: DC
  Administered 2023-12-16 – 2023-12-23 (×16): 50 mg via ORAL
  Filled 2023-12-15 (×16): qty 1

## 2023-12-15 MED ORDER — NITROGLYCERIN 0.4 MG SL SUBL
0.4000 mg | SUBLINGUAL_TABLET | SUBLINGUAL | Status: DC | PRN
Start: 2023-12-15 — End: 2023-12-23

## 2023-12-15 MED ORDER — IOHEXOL 350 MG/ML SOLN
75.0000 mL | Freq: Once | INTRAVENOUS | Status: AC | PRN
  Administered 2023-12-15: 75 mL via INTRAVENOUS

## 2023-12-15 MED ORDER — ALBUTEROL SULFATE (2.5 MG/3ML) 0.083% IN NEBU
5.0000 mg | INHALATION_SOLUTION | Freq: Once | RESPIRATORY_TRACT | Status: DC
  Filled 2023-12-15: qty 6

## 2023-12-15 MED ORDER — METHYLPREDNISOLONE SODIUM SUCC 40 MG IJ SOLR
40.0000 mg | Freq: Two times a day (BID) | INTRAMUSCULAR | Status: DC
  Administered 2023-12-16: 40 mg via INTRAVENOUS
  Filled 2023-12-15: qty 1

## 2023-12-15 MED ORDER — MAGNESIUM OXIDE 400 MG PO TABS
400.0000 mg | ORAL_TABLET | Freq: Every day | ORAL | Status: DC
  Administered 2023-12-16 – 2023-12-23 (×8): 400 mg via ORAL
  Filled 2023-12-15 (×16): qty 1

## 2023-12-15 MED ORDER — ENOXAPARIN SODIUM 40 MG/0.4ML IJ SOSY: 40.0000 mg | PREFILLED_SYRINGE | INTRAMUSCULAR | Status: DC

## 2023-12-15 MED ORDER — TORSEMIDE 20 MG PO TABS
20.0000 mg | ORAL_TABLET | Freq: Every day | ORAL | Status: DC
  Administered 2023-12-16 – 2023-12-22 (×7): 20 mg via ORAL
  Filled 2023-12-15 (×8): qty 1

## 2023-12-15 MED ORDER — IPRATROPIUM-ALBUTEROL 0.5-2.5 (3) MG/3ML IN SOLN
3.0000 mL | Freq: Once | RESPIRATORY_TRACT | Status: DC
  Filled 2023-12-15: qty 3

## 2023-12-15 MED ORDER — POTASSIUM CHLORIDE CRYS ER 10 MEQ PO TBCR
20.0000 meq | EXTENDED_RELEASE_TABLET | Freq: Every day | ORAL | Status: DC
  Administered 2023-12-16 – 2023-12-23 (×8): 20 meq via ORAL
  Filled 2023-12-15 (×9): qty 2

## 2023-12-15 MED ORDER — FUROSEMIDE 10 MG/ML IJ SOLN
40.0000 mg | Freq: Once | INTRAMUSCULAR | Status: AC
  Administered 2023-12-16: 40 mg via INTRAVENOUS
  Filled 2023-12-15: qty 4

## 2023-12-15 MED ORDER — ACETAMINOPHEN 325 MG PO TABS
650.0000 mg | ORAL_TABLET | Freq: Four times a day (QID) | ORAL | Status: DC | PRN
  Administered 2023-12-16: 650 mg via ORAL
  Filled 2023-12-15: qty 2

## 2023-12-15 MED ORDER — ALBUTEROL SULFATE (2.5 MG/3ML) 0.083% IN NEBU
2.5000 mg | INHALATION_SOLUTION | RESPIRATORY_TRACT | Status: DC | PRN
  Administered 2023-12-17 – 2023-12-20 (×4): 2.5 mg via RESPIRATORY_TRACT
  Filled 2023-12-15 (×4): qty 3

## 2023-12-15 MED ORDER — EZETIMIBE 10 MG PO TABS
10.0000 mg | ORAL_TABLET | Freq: Every day | ORAL | Status: DC
  Administered 2023-12-16 – 2023-12-23 (×8): 10 mg via ORAL
  Filled 2023-12-15 (×8): qty 1

## 2023-12-15 MED ORDER — GUAIFENESIN ER 600 MG PO TB12
600.0000 mg | ORAL_TABLET | Freq: Two times a day (BID) | ORAL | Status: DC
  Administered 2023-12-16 – 2023-12-23 (×16): 600 mg via ORAL
  Filled 2023-12-15 (×16): qty 1

## 2023-12-15 MED ORDER — PREDNISONE 20 MG PO TABS
40.0000 mg | ORAL_TABLET | Freq: Every day | ORAL | Status: DC
Start: 2023-12-17 — End: 2023-12-21

## 2023-12-15 MED ORDER — DILTIAZEM HCL ER COATED BEADS 120 MG PO CP24
120.0000 mg | ORAL_CAPSULE | Freq: Every day | ORAL | Status: DC
  Administered 2023-12-16 – 2023-12-23 (×8): 120 mg via ORAL
  Filled 2023-12-15 (×9): qty 1

## 2023-12-15 MED ORDER — IPRATROPIUM-ALBUTEROL 0.5-2.5 (3) MG/3ML IN SOLN
3.0000 mL | Freq: Four times a day (QID) | RESPIRATORY_TRACT | Status: DC
  Administered 2023-12-16 – 2023-12-20 (×17): 3 mL via RESPIRATORY_TRACT
  Filled 2023-12-15 (×19): qty 3

## 2023-12-15 MED ORDER — ONDANSETRON HCL 4 MG PO TABS: 4.0000 mg | ORAL_TABLET | Freq: Four times a day (QID) | ORAL | Status: DC | PRN

## 2023-12-15 MED ORDER — ACETAMINOPHEN 650 MG RE SUPP: 650.0000 mg | Freq: Four times a day (QID) | RECTAL | Status: DC | PRN

## 2023-12-15 MED ORDER — WARFARIN - PHARMACIST DOSING INPATIENT
Freq: Every day | Status: DC
  Filled 2023-12-15: qty 1

## 2023-12-15 MED ORDER — LEVALBUTEROL HCL 0.63 MG/3ML IN NEBU
1.8900 mg | INHALATION_SOLUTION | Freq: Once | RESPIRATORY_TRACT | Status: AC
  Administered 2023-12-15: 1.89 mg via RESPIRATORY_TRACT
  Filled 2023-12-15: qty 9

## 2023-12-15 MED ORDER — MAGNESIUM SULFATE 2 GM/50ML IV SOLN
2.0000 g | INTRAVENOUS | Status: AC
  Administered 2023-12-15: 2 g via INTRAVENOUS
  Filled 2023-12-15: qty 50

## 2023-12-15 NOTE — H&P (Signed)
 History and Physical    Patient: Molly Hodges UJW:[id number] DOB: 22-Nov-1931 DOA: 12/15/2023 DOS: the patient was seen and examined on 12/15/2023 PCP: Lamon Pillow, MD  Patient coming from: Home  Chief Complaint:  Chief Complaint  Patient presents with   Shortness of Breath    HPI: Molly Hodges is a 89 y.o. female with medical history significant for asthma/COPD complicated by chronic hypoxic respiratory failure on 2 L with exertion, severe mitral stenosis, HFpEF, atrial fibrillation on warfarin, hypertension, hyperlipidemia, hypothyroidism, CAD s/p PCI to proximal LAD (2022), carotid artery disease s/p right carotid stenting hospitalized from 4/27 to 12/14/23 with acute on chronic respiratory failure secondary to asthma and CHF exacerbation, who returns to the ED with shortness of breath.  States she was not quite improved when she was discharged and then the following morning woke up unable to breathe, and did not get relief with use of her inhalers.  She denies chest pain, fever or chills. ED course and data review: Tachypneic to 27 with O2 sat 97% on 2 L Labs notable for BNP 393 down from 532 a couple days prior, WBC 17,000, lactic acid 1.5, hemoglobin 11.9, INR 2.8EKG, showing A-fib at 85  CTA chest PE protocol negative for PE showing chronic findings with bronchial thickening-please refer to report.  Patient treated with Xopenex , Solu-Medrol , magnesium  but continued to have increased work of breathing.  Hospitalist consulted for admission.     Review of Systems: As mentioned in the history of present illness. All other systems reviewed and are negative.  Past Medical History:  Diagnosis Date   Aortic atherosclerosis (HCC)    a. 05/2021 TEE: GrIII atheroma plaque involving the asc, transverse, and desc Ao.   Asthma    CAD (coronary artery disease)    a. 04/2021 Cath: LM nl, LAD 85p/m, D1 80, RI nl, LCX nl, RCA nl; b. 07/2021 PCI: pLAD (2.75x26 Onyx Frontier DES), D1  (2.5x22 Onyx Frontier DES).   Carotid artery disease (HCC)    a. s/p R carotid stenting (9mm x 7mm x 4cm long Exact stent); b. 06/2021 U/S: RICA 40-59%, LICA 40-59%.   CHF (congestive heart failure) (HCC)    Community acquired pneumonia    Essential hypertension    Glaucoma    Hyperlipidemia    Mitral regurgitation    a. TTE 08/2015: EF 60-65%, normal wall motion, mild MR, mildly dilated left atrium measuring 40 mm, RVSF normal, PASP normal; b. 05/2021 TEE: Moderate MR.   Mitral stenosis    a. 05/2021 L/RHC: Sev MS w/ mean grad 13-64mmHg and MV area of 0.5-06.cm^2; b. 05/2021 TEE: EF 55-60%, no rwma, nl RV fxn, mod MR, mod MS (MV area by P1/2t: 1.61 cm^2 w/ mean grad of ).   Peripheral neuropathy    Persistent atrial fibrillation (HCC) 09/17/2015   a. s/p DCCV 11/15/2015; b. CHADS2VASc => 4 (HTN, age x 2, female)--> warfarin; c. 06/2021 recurrent afib-->amio added.   Squamous cell carcinoma of skin 12/18/2021   R dorsum hand, EDC   Squamous cell carcinoma of skin 12/31/2021   R hand dorsum, recurrent - excised 02/04/2022   Squamous cell carcinoma of skin 12/31/2021   L forearm - ED&C   Squamous cell carcinoma of skin 06/24/2022   R thumb webspace with wart virus - ED&C   Squamous cell carcinoma of skin 10/20/2022   L lat knee - tx with ED&C   Past Surgical History:  Procedure Laterality Date   ABDOMINAL HYSTERECTOMY  1987  due to heavy bleeding   APPENDECTOMY     CARDIAC CATHETERIZATION     CAROTID PTA/STENT INTERVENTION Right 08/15/2020   Procedure: CAROTID PTA/STENT INTERVENTION;  Surgeon: Celso College, MD;  Location: [facility] INVASIVE CV [facility];  Service: Cardiovascular;  Laterality: Right;   CATARACT EXTRACTION     CORONARY IMAGING/OCT N/A 08/02/2021   Procedure: INTRAVASCULAR IMAGING/OCT;  Surgeon: Sammy Crisp, MD;  Location: [facility];  Service: Cardiovascular;  Laterality: N/A;   CORONARY STENT INTERVENTION N/A 08/02/2021   Procedure: CORONARY STENT  INTERVENTION;  Surgeon: Sammy Crisp, MD;  Location: [facility];  Service: Cardiovascular;  Laterality: N/A;   ELECTROPHYSIOLOGIC STUDY N/A 11/15/2015   Procedure: CARDIOVERSION;  Surgeon: Devorah Fonder, MD;  Location: [facility] ORS;  Service: Cardiovascular;  Laterality: N/A;   LEFT HEART CATH AND CORONARY ANGIOGRAPHY N/A 08/02/2021   Procedure: LEFT HEART CATH AND CORONARY ANGIOGRAPHY;  Surgeon: Sammy Crisp, MD;  Location: [facility];  Service: Cardiovascular;  Laterality: N/A;   LEFT HEART CATH AND CORONARY ANGIOGRAPHY N/A 08/07/2021   Procedure: LEFT HEART CATH AND CORONARY ANGIOGRAPHY;  Surgeon: Wenona Hamilton, MD;  Location: [facility];  Service: Cardiovascular;  Laterality: N/A;   RIGHT/LEFT HEART CATH AND CORONARY ANGIOGRAPHY N/A 05/17/2021   Procedure: RIGHT/LEFT HEART CATH AND CORONARY ANGIOGRAPHY;  Surgeon: Sammy Crisp, MD;  Location: [facility] CV [facility];  Service: Cardiovascular;  Laterality: N/A;   TEE WITHOUT CARDIOVERSION N/A 06/13/2021   Procedure: TRANSESOPHAGEAL ECHOCARDIOGRAM (TEE);  Surgeon: Gollan, Timothy J, MD;  Location: [facility] ORS;  Service: Cardiovascular;  Laterality: N/A;   Social History:  reports that she has never smoked. She has never used smokeless tobacco. She reports that she does not drink alcohol and does not use drugs.  Allergies  Allergen Reactions   Shellfish Allergy Anaphylaxis    Other reaction(s): Hallucination   Azithromycin  Other (See Comments)    Extreme burning sensation at IV site    Tamiflu [Oseltamivir Phosphate] Other (See Comments)    Reaction:  Hallucinations     Albuterol  Palpitations    Heart racing.     Family History  Problem Relation Age of Onset   CAD Mother    CAD Father    Cancer Son 83       lung cancer    Prior to Admission medications   Medication Sig Start Date End Date Taking? Authorizing Provider  arformoterol  (BROVANA ) 15 MCG/2ML NEBU Take 2 mLs (15 mcg total) by  nebulization 2 (two) times daily. 09/28/23   Kasa, Kurian, MD  budesonide  (PULMICORT ) 0.5 MG/2ML nebulizer solution Take 2 mLs (0.5 mg total) by nebulization 2 (two) times daily. 09/28/23 09/27/24  Kasa, Kurian, MD  Calcium  Carbonate-Vitamin D  600-400 MG-UNIT tablet Take 1 tablet by mouth daily.    [provider]  cholecalciferol  (VITAMIN D ) 1000 UNITS tablet Take 1,000 Units by mouth daily.    [provider]  clopidogrel  (PLAVIX ) 75 MG tablet TAKE 1 TABLET BY MOUTH DAILY 04/13/23   Gollan, Timothy J, MD  diltiazem  (CARDIZEM  CD) 120 MG 24 hr capsule Take 1 capsule (120 mg total) by mouth daily. 01/07/23   Gollan, Timothy J, MD  dorzolamide  (TRUSOPT ) 2 % ophthalmic solution Place 1 drop into both eyes 2 (two) times daily.     [provider]  ezetimibe  (ZETIA ) 10 MG tablet TAKE 1 TABLET BY MOUTH DAILY 06/22/23   Gollan, Timothy J, MD  ipratropium-albuterol  (DUONEB) 0.5-2.5 (3) MG/3ML SOLN TAKE BY  NEBULIZATION EVERY 4 HOURS AS NEEDED 04/14/23   Lamon Pillow, MD  latanoprost  (XALATAN ) 0.005 % ophthalmic solution Place 1 drop into both eyes at bedtime.     [provider]  levalbuterol  (XOPENEX  HFA) 45 MCG/ACT inhaler Inhale 2 puffs into the lungs every 4 (four) hours as needed for wheezing. 12/14/23 12/13/24  Ree Candy, MD  levalbuterol  (XOPENEX  HFA) 45 MCG/ACT inhaler Inhale 2 puffs into the lungs every 6 (six) hours as needed for wheezing or shortness of breath. 12/14/23 01/13/24  Ree Candy, MD  magnesium  oxide (MAG-OX) 400 MG tablet Take 400 mg by mouth daily.    [provider]  Multiple Vitamins-Minerals (PRESERVISION AREDS 2+MULTI VIT) CAPS Take 2 capsules by mouth daily.    [provider]  nitroGLYCERIN  (NITROSTAT ) 0.4 MG SL tablet Place 1 tablet (0.4 mg total) under the tongue every 5 (five) minutes as needed (for chest pain or shortness of breath). 08/07/23 08/06/24  Morey Ar, NP  potassium chloride  (KLOR-CON )  10 MEQ tablet TAKE 2 TABLETS BY MOUTH DAILY. TAKE EXTRA 2 TABLETS WHEN TAKING EXTRA LASIX . 07/20/23   Gollan, Timothy J, MD  pregabalin  (LYRICA ) 50 MG capsule TAKE 1 CAPSULE BY MOUTH 2 TIMES DAILY 08/04/23   Lamon Pillow, MD  rosuvastatin  (CRESTOR ) 20 MG tablet TAKE 1 TABLET BY MOUTH DAILY 09/04/23   Gollan, Timothy J, MD  torsemide  (DEMADEX ) 20 MG tablet Take 1 tablet (20 mg total) by mouth daily. You may take additional dose (20 mg tab) for 3 pound gain overnight or 5 pound gain in 1 week. 08/07/23   Morey Ar, NP  vitamin C  (ASCORBIC ACID ) 500 MG tablet Take 500 mg by mouth daily.    [provider]  vitamin E  100 UNIT capsule Take 200 Units by mouth daily.    [provider]  warfarin (COUMADIN ) 2.5 MG tablet TAKE 1 & 1/2 TABLETS DAILY EXCEPT TAKE ONE TABLET ON MONDAYS, WEDNESDAYS, AND FRIDAYS OR AS DIRECTED BY COUMADIN  CLINIC 10/26/23   Devorah Fonder, MD    Physical Exam: Vitals:   12/15/23 1612 12/15/23 1613 12/15/23 2030 12/15/23 2100  BP:  (!) 110/48 (!) 150/63 (!) 135/43  Pulse:  84 91 87  Resp:  20 (!) 27 (!) 27  Temp:  98.4 F (36.9 C)    SpO2:  97% 99% 99%  Weight: 54.3 kg     Height: 5\' 5"  (1.651 m)      Physical Exam Vitals and nursing note reviewed.  Constitutional:      General: She is not in acute distress. HENT:     Head: Normocephalic and atraumatic.  Cardiovascular:     Rate and Rhythm: Normal rate and regular rhythm.     Heart sounds: Normal heart sounds.  Pulmonary:     Effort: Tachypnea present.     Breath sounds: Wheezing present.  Abdominal:     Palpations: Abdomen is soft.     Tenderness: There is no abdominal tenderness.  Neurological:     Mental Status: Mental status is at baseline.     Labs on Admission: I have personally reviewed following labs and imaging studies  CBC: Recent Labs  Lab 12/13/23 1633 12/14/23 0531 12/15/23 1620  WBC 15.2* 12.9* 17.6*  HGB 11.9* 11.5* 11.9*  HCT 39.0 35.7* 37.7  MCV  73.6* 70.4* 71.8*  PLT 180 165 223   Basic Metabolic Panel: Recent Labs  Lab 12/13/23 1633 12/14/23 0531 12/15/23 1620  NA 137 136 136  K 3.8 3.3* 4.2  CL 104 102 99  CO2 23 23 24   GLUCOSE 118* 270* 123*  BUN 21 23 39*  CREATININE 0.90 0.84 0.94  CALCIUM  8.3* 8.4* 8.6*   GFR: Estimated Creatinine Clearance: 34.1 mL/min (by C-G formula based on SCr of 0.94 mg/dL). Liver Function Tests: No results for input(s): "AST", "ALT", "ALKPHOS", "BILITOT", "PROT", "ALBUMIN" in the last 168 hours. No results for input(s): "LIPASE", "AMYLASE" in the last 168 hours. No results for input(s): "AMMONIA" in the last 168 hours. Coagulation Profile: Recent Labs  Lab 12/13/23 1721 12/14/23 0531 12/15/23 1620  INR 2.2* 2.3* 2.8*   Cardiac Enzymes: No results for input(s): "CKTOTAL", "CKMB", "CKMBINDEX", "TROPONINI" in the last 168 hours. BNP (last 3 results) No results for input(s): "PROBNP" in the last 8760 hours. HbA1C: No results for input(s): "HGBA1C" in the last 72 hours. CBG: No results for input(s): "GLUCAP" in the last 168 hours. Lipid Profile: No results for input(s): "CHOL", "HDL", "LDLCALC", "TRIG", "CHOLHDL", "LDLDIRECT" in the last 72 hours. Thyroid  Function Tests: No results for input(s): "TSH", "T4TOTAL", "FREET4", "T3FREE", "THYROIDAB" in the last 72 hours. Anemia Panel: No results for input(s): "VITAMINB12", "FOLATE", "FERRITIN", "TIBC", "IRON", "RETICCTPCT" in the last 72 hours. Urine analysis:    Component Value Date/Time   COLORURINE AMBER (A) 09/17/2015 1841   APPEARANCEUR HAZY (A) 09/17/2015 1841   LABSPEC 1.029 09/17/2015 1841   PHURINE 5.0 09/17/2015 1841   GLUCOSEU NEGATIVE 09/17/2015 1841   HGBUR 2+ (A) 09/17/2015 1841   BILIRUBINUR NEGATIVE 09/17/2015 1841   KETONESUR NEGATIVE 09/17/2015 1841   PROTEINUR 100 (A) 09/17/2015 1841   NITRITE NEGATIVE 09/17/2015 1841   LEUKOCYTESUR 1+ (A) 09/17/2015 1841    Radiological Exams on Admission: CT Angio Chest  PE W and/or Wo Contrast Result Date: 12/15/2023 EXAM: CTA of the Chest with contrast for PE 12/15/2023 09:37:30 PM TECHNIQUE: CTA of the chest was performed after the administration of intravenous contrast. Multiplanar reformatted images are provided for review. MIP images are provided for review. Automated exposure control, iterative reconstruction, and/or weight based adjustment of the mA/kV was utilized to reduce the radiation dose to as low as reasonably achievable. COMPARISON: Chest radiograph earlier today and CTA chest 01/26/2022. CLINICAL HISTORY: Pulmonary embolism (PE) suspected, high prob. Patient comes in from home via pow with complaints of SOB. Pt was discharged from the hospital yesterday afternoon. Patient states that she slept good last night at home, but when she woke up this morning, she couldn't breath. Pt did a breathing treatment and a rescue inhaler with no relief. Pt has a history of afib, pneumonia, and is on blood thinners. Pt is alert and oriented x4, visibly lethargic, and shob with talking. FINDINGS: PULMONARY ARTERIES: Pulmonary arteries are adequately opacified for evaluation. No evidence of pulmonary embolism. Main pulmonary artery is normal in caliber. MEDIASTINUM: The heart and pericardium demonstrate no acute abnormality. Thoracic aortic atherosclerosis. LYMPH NODES: Stable mediastinal lymphadenopathy, including a dominant 16 mm short axis subcarinal node (image 68), favored to be reactive. LUNGS AND PLEURA: Bronchial wall thickening in the bilateral lower lobes, chronic peripheral subpleural scarring in the right lower lobe. Faint peribronchovascular ground-glass opacity/scarring, chronic. Scattered calcified granulomata, benign. No evidence of pleural effusion or pneumothorax. UPPER ABDOMEN: Atherosclerotic calcifications of the abdominal aorta and branch vessels. SOFT TISSUES AND BONES: No acute bone or soft tissue abnormality. IMPRESSION: 1. No evidence of pulmonary embolism.  2. Cardiomegaly. 3. Stable mediastinal lymphadenopathy, favored to be reactive. 4. Scattered areas of scarring and  bronchial wall thickening, chronic. Electronically signed by: Zadie Herter MD 12/15/2023 09:46 PM EDT RP Workstation: [id number]   DG Chest 2 View Result Date: 12/15/2023 CLINICAL DATA:  Shortness of breath. EXAM: CHEST - 2 VIEW COMPARISON:  12/13/2023 FINDINGS: Stable enlarged cardiac silhouette. Stable coronary artery stent. Tortuous and partially calcified thoracic aorta. Clear lungs with normal vascularity. The lungs remain hyperexpanded with mild-to-moderate peribronchial thickening. No significant change in mild scarring at the right lateral lung base. Diffuse osteopenia. Interval right anterior chest electronic device. IMPRESSION: 1. No acute abnormality. 2. Stable cardiomegaly. 3. Stable changes of COPD and chronic bronchitis. Electronically Signed   By: Catherin Closs M.D.   On: 12/15/2023 16:56   Data Reviewed for HPI: Relevant notes from primary care and specialist visits, past discharge summaries as available in EHR, including Care Everywhere. Prior diagnostic testing as pertinent to current admission diagnoses Updated medications and problem lists for reconciliation ED course, including vitals, labs, imaging, treatment and response to treatment Triage notes, nursing and pharmacy notes and ED provider's notes Notable results as noted above in HPI      Assessment and Plan:      Asthma, chronic obstructive, with acute exacerbation (HCC) Chronic respiratory failure with hypoxia DuoNebs every 6 and as needed Solu-Medrol  Antitussives, flutter valve Continue supplemental oxygen    Chronic heart failure with preserved ejection fraction (HFpEF) Mitral stenosis, moderate to severe BNP in the 300s, down from the 500s during recent stay One-time dose of Lasix  40 mg IV Resume home Lasix  and GDMT Daily weights with intake and output monitoring Echo 11/2023 with preserved  EF and moderate to severe mitral stenosis She follows with Dr. Gollan.   Paroxysmal atrial fibrillation (HCC) Rate controlled at this time. Continue diltiazem    CAD (coronary artery disease) No chest pain reported at this time   No ischemic EKG changes.  Continue home Plavix , statin, Zetia    PAD (peripheral artery disease) (HCC) - Continue home Plavix , statin, Zetia    Hypothyroidism - Continue home regimen   Hypertension Well-controlled on diltiazem  only. Continue home regimen     DVT prophylaxis: Lovenox   Consults: none  Advance Care Planning:   Code Status: Prior   Family Communication: none  Disposition Plan: Back to previous home environment  Severity of Illness: The appropriate patient status for this patient is OBSERVATION. Observation status is judged to be reasonable and necessary in order to provide the required intensity of service to ensure the patient's safety. The patient's presenting symptoms, physical exam findings, and initial radiographic and laboratory data in the context of their medical condition is felt to place them at decreased risk for further clinical deterioration. Furthermore, it is anticipated that the patient will be medically stable for discharge from the hospital within 2 midnights of admission.   Author: Lanetta Pion, MD 12/15/2023 11:06 PM  For on call review [email or web address].

## 2023-12-15 NOTE — Telephone Encounter (Signed)
 Chief Complaint: Wheezing Symptoms: Cough Frequency: Began today Pertinent Negatives: Patient denies fever Disposition: [] ED /[x] Urgent Care (no appt availability in office) / [] Appointment(In office/virtual)/ []  [place] [facility]/ [] Home Care/ [] Refused Recommended Disposition /[]  Mobile Bus/ []  Follow-up with PCP Additional Notes: Pt's daughter reports pt was discharged from hospital yesterday and awoke this AM with wheeze and ongoing cough. Pt SOB and experiencing worsening SOB with coughing episodes. No available appts, UC advised, pt and daughter agreeable. This RN educated pt on home care, new-worsening symptoms, when to call back/seek emergent care. Pt verbalized understanding and agrees to plan.    Copied from CRM [id number]. Topic: Clinical - Red Word Triage >> Dec 15, 2023  1:51 PM Rosamond Comes wrote: Red Word that prompted transfer to Nurse Triage: patient daughter [name] calling in, Patient was discharged from [facility]  12/14/23 wheezing no fever, Reason for Disposition  [1] MILD difficulty breathing (e.g., minimal/no SOB at rest, SOB with walking, pulse <100) AND [2] NEW-onset or WORSE than normal  Answer Assessment - Initial Assessment Questions 1. RESPIRATORY STATUS: "Describe your breathing?" (e.g., wheezing, shortness of breath, unable to speak, severe coughing)      Wheezing 2. ONSET: "When did this breathing problem begin?"      Today 3. PATTERN "Does the difficult breathing come and go, or has it been constant since it started?"      Constant 4. SEVERITY: "How bad is your breathing?" (e.g., mild, moderate, severe)    - MILD: No SOB at rest, mild SOB with walking, speaks normally in sentences, can lie down, no retractions, pulse < 100.    - MODERATE: SOB at rest, SOB with minimal exertion and prefers to sit, cannot lie down flat, speaks in phrases, mild retractions, audible wheezing, pulse 100-120.    - SEVERE: Very SOB at rest, speaks in single words, struggling  to breathe, sitting hunched forward, retractions, pulse > 120      Mild 6. CARDIAC HISTORY: "Do you have any history of heart disease?" (e.g., heart attack, angina, bypass surgery, angioplasty)      CHF 8. CAUSE: "What do you think is causing the breathing problem?"      Illness 9. OTHER SYMPTOMS: "Do you have any other symptoms? (e.g., dizziness, runny nose, cough, chest pain, fever)     Cough  Protocols used: Breathing Difficulty-A-AH

## 2023-12-15 NOTE — Hospital Course (Signed)
 Molly Hodges

## 2023-12-15 NOTE — ED Triage Notes (Signed)
 Patient comes in from home via pow with complaints of SOB. Pt was discharged from the hospital yesterday afternoon. Patient states that she slept good last night at home, but when she woke up this morning, she couldn't breath. Pt did a breathing treatment and a rescue inhaler with no relief. Pt has a history of afib, pneumonia, and is on blood thinners. Pt is alert and oriented x4, visibly lethargic, and shob with talking.

## 2023-12-15 NOTE — Telephone Encounter (Signed)
 Patient was admitted 12/13/23

## 2023-12-15 NOTE — ED Notes (Signed)
 Patient transported to CT

## 2023-12-15 NOTE — ED Provider Notes (Signed)
 [facility] Provider Note    Event Date/Time   First MD Initiated Contact with Patient 12/15/23 2002     (approximate)   History   Chief Complaint: Shortness of Breath   HPI  Molly Hodges is a 89 y.o. female with a history of asthma, CHF, hypertension who comes to the ED complaining of shortness of breath and wheezing that started this morning.  She used her bronchodilators at home without relief.  Denies fever chills or chest pain.  She was recently hospitalized 2 days ago for CHF exacerbation, discharged yesterday, states that she was feeling okay all throughout yesterday.        Past Medical History:  Diagnosis Date   Aortic atherosclerosis (HCC)    a. 05/2021 TEE: GrIII atheroma plaque involving the asc, transverse, and desc Ao.   Asthma    CAD (coronary artery disease)    a. 04/2021 Cath: LM nl, LAD 85p/m, D1 80, RI nl, LCX nl, RCA nl; b. 07/2021 PCI: pLAD (2.75x26 Onyx Frontier DES), D1 (2.5x22 Onyx Frontier DES).   Carotid artery disease (HCC)    a. s/p R carotid stenting (9mm x 7mm x 4cm long Exact stent); b. 06/2021 U/S: RICA 40-59%, LICA 40-59%.   CHF (congestive heart failure) (HCC)    Community acquired pneumonia    Essential hypertension    Glaucoma    Hyperlipidemia    Mitral regurgitation    a. TTE 08/2015: EF 60-65%, normal wall motion, mild MR, mildly dilated left atrium measuring 40 mm, RVSF normal, PASP normal; b. 05/2021 TEE: Moderate MR.   Mitral stenosis    a. 05/2021 L/RHC: Sev MS w/ mean grad 13-68mmHg and MV area of 0.5-06.cm^2; b. 05/2021 TEE: EF 55-60%, no rwma, nl RV fxn, mod MR, mod MS (MV area by P1/2t: 1.61 cm^2 w/ mean grad of ).   Peripheral neuropathy    Persistent atrial fibrillation (HCC) 09/17/2015   a. s/p DCCV 11/15/2015; b. CHADS2VASc => 4 (HTN, age x 2, female)--> warfarin; c. 06/2021 recurrent afib-->amio added.   Squamous cell carcinoma of skin 12/18/2021   R dorsum hand, EDC   Squamous cell  carcinoma of skin 12/31/2021   R hand dorsum, recurrent - excised 02/04/2022   Squamous cell carcinoma of skin 12/31/2021   L forearm - ED&C   Squamous cell carcinoma of skin 06/24/2022   R thumb webspace with wart virus - ED&C   Squamous cell carcinoma of skin 10/20/2022   L lat knee - tx with [facility]    Current Outpatient Rx   Order #: [id number] Class: Normal   Order #: [id number] Class: Normal   Order #: [id number] Class: Historical Med   Order #: [id number] Class: Historical Med   Order #: [id number] Class: Normal   Order #: [id number] Class: Normal   Order #: [id number] Class: Historical Med   Order #: [id number] Class: Normal   Order #: [id number] Class: Normal   Order #: [id number] Class: Historical Med   Order #: [id number] Class: Normal   Order #: [id number] Class: Normal   Order #: [id number] Class: Historical Med   Order #: [id number] Class: Historical Med   Order #: [id number] Class: Normal   Order #: [id number] Class: Normal   Order #: [id number] Class: Normal   Order #: [id number] Class: Normal   Order #: [id number] Class: Normal   Order #: [id number] Class: Historical Med   Order #: [id number] Class: Historical Med   Order #: [id number] Class: Normal    Past Surgical History:  Procedure Laterality Date   ABDOMINAL HYSTERECTOMY  1987   due to  heavy bleeding   APPENDECTOMY     CARDIAC CATHETERIZATION     CAROTID PTA/STENT INTERVENTION Right 08/15/2020   Procedure: CAROTID PTA/STENT INTERVENTION;  Surgeon: Celso College, MD;  Location: [facility] INVASIVE CV LAB;  Service: Cardiovascular;  Laterality: Right;   CATARACT EXTRACTION     CORONARY IMAGING/OCT N/A 08/02/2021   Procedure: INTRAVASCULAR IMAGING/OCT;  Surgeon: Sammy Crisp, MD;  Location: [facility] INVASIVE CV LAB;  Service: Cardiovascular;  Laterality: N/A;   CORONARY STENT INTERVENTION N/A 08/02/2021   Procedure: CORONARY STENT INTERVENTION;  Surgeon: Sammy Crisp, MD;  Location: [facility] INVAS[facility] CV LAB;  Service: Cardiovascular;  Laterality: N/A;    ELECTROPHYSIOLOGIC STUDY N/A 11/15/2015   Procedure: CARDIOVERSION;  Surgeon: Devorah Fonder, MD;  Location: [facility] ORS;  Service: Cardiovascular;  Laterality: N/A;   LEFT HEART CATH AND CORONARY ANGIOGRAPHY N/A 08/02/2021   Procedure: LEFT HEART CATH AND CORONARY ANGIOGRAPHY;  Surgeon: Sammy Crisp, MD;  Location: [facility];  Service: Cardiovascular;  Laterality: N/A;   LEFT HEART CATH AND CORONARY ANGIOGRAPHY N/A 08/07/2021   Procedure: LEFT HEART CATH AND CORONARY ANGIOGRAPHY;  Surgeon: Wenona Hamilton, MD;  Location: [facility] INV[facility] CV [facility];  Service: Cardiovascular;  Laterality: N/A;   RIGHT/LEFT HEART CATH AND CORONARY ANGIOGRAPHY N/A 05/17/2021   Procedure: RIGHT/LEFT HEART CATH AND CORONARY ANGIOGRAPHY;  Surgeon: Sammy Crisp, MD;  Location: [facility] IN[facility] CV [facility];  Service: Cardiovascular;  Laterality: N/A;   TEE WITHOUT CARDIOVERSION N/A 06/13/2021   Procedure: TRANSESOPHAGEAL ECHOCARDIOGRAM (TEE);  Surgeon: Devorah Fonder, MD;  Location: [facility] ORS;  Service: Cardiovascular;  Laterality: N/A;    Physical Exam   Triage Vital Signs: ED Triage Vitals  Encounter Vitals Group     BP 12/15/23 1613 (!) 110/48     Systolic BP Percentile --      Diastolic BP Percentile --      Pulse Rate 12/15/23 1613 84     Resp 12/15/23 1613 20     Temp 12/15/23 1613 98.4 F (36.9 C)     Temp src --      SpO2 12/15/23 1613 97 %     Weight 12/15/23 1612 119 lb 11.4 oz (54.3 kg)     Height 12/15/23 1612 5\' 5"  (1.651 m)     Head Circumference --      Peak Flow --      Pain Score 12/15/23 1612 0     Pain Loc --      Pain Education --      Exclude from Growth Chart --     Most recent vital signs: Vitals:   12/15/23 2030 12/15/23 2100  BP: (!) 150/63 (!) 135/43  Pulse: 91 87  Resp: (!) 27 (!) 27  Temp:    SpO2: 99% 99%    General: Awake, no distress.  CV:  Good peripheral perfusion.  Regular rate and rhythm Resp:  Tachypnea, accessory muscle use.  Diffuse expiratory  wheezing and prolonged expiratory phase.  No focal crackles Abd:  No distention.  Soft nontender Other:  No lower extremity edema   ED Results / Procedures / Treatments   Labs (all labs ordered are listed, but only abnormal results are displayed) Labs Reviewed  BASIC METABOLIC PANEL WITH GFR - Abnormal; Notable for the following components:      Result Value   Glucose, Bld 123 (*)    BUN 39 (*)    Calcium  8.6 (*)    GFR, Estimated 58 (*)    All other components  within normal limits  CBC - Abnormal; Notable for the following components:   WBC 17.6 (*)    RBC 5.25 (*)    Hemoglobin 11.9 (*)    MCV 71.8 (*)    MCH 22.7 (*)    RDW 17.8 (*)    All other components within normal limits  PROTIME-INR - Abnormal; Notable for the following components:   Prothrombin Time 29.6 (*)    INR 2.8 (*)    All other components within normal limits  BRAIN NATRIURETIC PEPTIDE - Abnormal; Notable for the following components:   B Natriuretic Peptide 393.5 (*)    All other components within normal limits  LACTIC ACID, PLASMA     EKG Interpreted by me Atrial fibrillation, rate of 85.  Normal axis, normal intervals.  No acute ischemic changes.   RADIOLOGY Chest x-ray interpreted by me, unremarkable.  Radiology report reviewed   PROCEDURES:  Procedures   MEDICATIONS ORDERED IN ED: Medications  methylPREDNISolone  sodium succinate (SOLU-MEDROL ) 125 mg/2 mL injection 125 mg (125 mg Intravenous Given 12/15/23 2052)  magnesium  sulfate IVPB 2 g 50 mL (2 g Intravenous New Bag/Given 12/15/23 2049)  levalbuterol  (XOPENEX ) nebulizer solution 1.89 mg (1.89 mg Nebulization Given 12/15/23 2100)  iohexol  (OMNIPAQUE ) 350 MG/ML injection 75 mL (75 mLs Intravenous Contrast Given 12/15/23 2131)     IMPRESSION / MDM / ASSESSMENT AND PLAN / ED COURSE  I reviewed the triage vital signs and the nursing notes.  DDx: COPD exacerbation, pneumonia, pulmonary embolism, CHF  Patient's presentation is most  consistent with acute presentation with potential threat to life or bodily function.  Patient presents with worsening shortness of breath today after discharge from the hospital yesterday.  Clinically has a COPD/asthma exacerbation, but with her recent hospitalization and rapidly recurrent symptoms, will obtain CT chest to rule out occult pneumonia versus PE.  Will give bronchodilators magnesium  and Solu-Medrol  for now.   Clinical Course as of 12/15/23 2307  Tue Dec 15, 2023  2253 Patient feels improved, but still short of breath.  Has persistent wheezing and prolonged expirations on exam.  CTA chest is negative for acute findings.  Will need to hospitalize for further symptomatic management. [PS]    Clinical Course User Index [PS] Jacquie Maudlin, MD    ----------------------------------------- 11:07 PM on 12/15/2023 ----------------------------------------- Case discussed with hospitalist   FINAL CLINICAL IMPRESSION(S) / ED DIAGNOSES   Final diagnoses:  COPD exacerbation (HCC)     Rx / DC Orders   ED Discharge Orders     None        Note:  This document was prepared using Dragon voice recognition software and may include unintentional dictation errors.   Jacquie Maudlin, MD 12/15/23 [id number]

## 2023-12-16 DIAGNOSIS — I739 Peripheral vascular disease, unspecified: Secondary | ICD-10-CM | POA: Diagnosis not present

## 2023-12-16 DIAGNOSIS — Z7902 Long term (current) use of antithrombotics/antiplatelets: Secondary | ICD-10-CM | POA: Diagnosis not present

## 2023-12-16 DIAGNOSIS — I700 Atherosclerosis of aorta: Secondary | ICD-10-CM | POA: Diagnosis not present

## 2023-12-16 DIAGNOSIS — Z91013 Allergy to seafood: Secondary | ICD-10-CM | POA: Diagnosis not present

## 2023-12-16 DIAGNOSIS — J449 Chronic obstructive pulmonary disease, unspecified: Secondary | ICD-10-CM | POA: Diagnosis not present

## 2023-12-16 DIAGNOSIS — J4521 Mild intermittent asthma with (acute) exacerbation: Secondary | ICD-10-CM | POA: Diagnosis not present

## 2023-12-16 DIAGNOSIS — T380X5A Adverse effect of glucocorticoids and synthetic analogues, initial encounter: Secondary | ICD-10-CM | POA: Diagnosis not present

## 2023-12-16 DIAGNOSIS — I517 Cardiomegaly: Secondary | ICD-10-CM | POA: Diagnosis not present

## 2023-12-16 DIAGNOSIS — J9611 Chronic respiratory failure with hypoxia: Secondary | ICD-10-CM | POA: Diagnosis not present

## 2023-12-16 DIAGNOSIS — I4819 Other persistent atrial fibrillation: Secondary | ICD-10-CM | POA: Diagnosis not present

## 2023-12-16 DIAGNOSIS — E039 Hypothyroidism, unspecified: Secondary | ICD-10-CM | POA: Diagnosis not present

## 2023-12-16 DIAGNOSIS — J441 Chronic obstructive pulmonary disease with (acute) exacerbation: Principal | ICD-10-CM | POA: Diagnosis present

## 2023-12-16 DIAGNOSIS — Z66 Do not resuscitate: Secondary | ICD-10-CM | POA: Diagnosis not present

## 2023-12-16 DIAGNOSIS — G629 Polyneuropathy, unspecified: Secondary | ICD-10-CM | POA: Diagnosis not present

## 2023-12-16 DIAGNOSIS — Z8249 Family history of ischemic heart disease and other diseases of the circulatory system: Secondary | ICD-10-CM | POA: Diagnosis not present

## 2023-12-16 DIAGNOSIS — R7303 Prediabetes: Secondary | ICD-10-CM | POA: Diagnosis not present

## 2023-12-16 DIAGNOSIS — Z801 Family history of malignant neoplasm of trachea, bronchus and lung: Secondary | ICD-10-CM | POA: Diagnosis not present

## 2023-12-16 DIAGNOSIS — I5032 Chronic diastolic (congestive) heart failure: Secondary | ICD-10-CM | POA: Diagnosis not present

## 2023-12-16 DIAGNOSIS — I110 Hypertensive heart disease with heart failure: Secondary | ICD-10-CM | POA: Diagnosis not present

## 2023-12-16 DIAGNOSIS — R0602 Shortness of breath: Secondary | ICD-10-CM | POA: Diagnosis not present

## 2023-12-16 DIAGNOSIS — Z7951 Long term (current) use of inhaled steroids: Secondary | ICD-10-CM | POA: Diagnosis not present

## 2023-12-16 DIAGNOSIS — E785 Hyperlipidemia, unspecified: Secondary | ICD-10-CM | POA: Diagnosis not present

## 2023-12-16 DIAGNOSIS — I050 Rheumatic mitral stenosis: Secondary | ICD-10-CM | POA: Diagnosis not present

## 2023-12-16 DIAGNOSIS — Z888 Allergy status to other drugs, medicaments and biological substances status: Secondary | ICD-10-CM | POA: Diagnosis not present

## 2023-12-16 DIAGNOSIS — Z7901 Long term (current) use of anticoagulants: Secondary | ICD-10-CM | POA: Diagnosis not present

## 2023-12-16 DIAGNOSIS — I2510 Atherosclerotic heart disease of native coronary artery without angina pectoris: Secondary | ICD-10-CM | POA: Diagnosis not present

## 2023-12-16 DIAGNOSIS — Z955 Presence of coronary angioplasty implant and graft: Secondary | ICD-10-CM | POA: Diagnosis not present

## 2023-12-16 DIAGNOSIS — Z85828 Personal history of other malignant neoplasm of skin: Secondary | ICD-10-CM | POA: Diagnosis not present

## 2023-12-16 LAB — PROTIME-INR
INR: 2.6 — ABNORMAL HIGH (ref 0.8–1.2)
Prothrombin Time: 28.1 s — ABNORMAL HIGH (ref 11.4–15.2)

## 2023-12-16 LAB — CBC
HCT: 36.6 % (ref 36.0–46.0)
Hemoglobin: 11.6 g/dL — ABNORMAL LOW (ref 12.0–15.0)
MCH: 22.6 pg — ABNORMAL LOW (ref 26.0–34.0)
MCHC: 31.7 g/dL (ref 30.0–36.0)
MCV: 71.2 fL — ABNORMAL LOW (ref 80.0–100.0)
Platelets: 209 10*3/uL (ref 150–400)
RBC: 5.14 MIL/uL — ABNORMAL HIGH (ref 3.87–5.11)
RDW: 17.7 % — ABNORMAL HIGH (ref 11.5–15.5)
WBC: 12.3 10*3/uL — ABNORMAL HIGH (ref 4.0–10.5)
nRBC: 0 % (ref 0.0–0.2)

## 2023-12-16 MED ORDER — WARFARIN SODIUM 3 MG PO TABS
3.0000 mg | ORAL_TABLET | Freq: Every day | ORAL | Status: DC
  Administered 2023-12-16 – 2023-12-17 (×2): 3 mg via ORAL
  Filled 2023-12-16 (×2): qty 1

## 2023-12-16 MED ORDER — TORSEMIDE 20 MG PO TABS: 20.0000 mg | ORAL_TABLET | Freq: Every day | ORAL | Status: DC

## 2023-12-16 MED ORDER — METHYLPREDNISOLONE SODIUM SUCC 40 MG IJ SOLR
40.0000 mg | Freq: Two times a day (BID) | INTRAMUSCULAR | Status: DC
  Administered 2023-12-16 – 2023-12-18 (×4): 40 mg via INTRAVENOUS
  Filled 2023-12-16 (×4): qty 1

## 2023-12-16 MED ORDER — CLOPIDOGREL BISULFATE 75 MG PO TABS
75.0000 mg | ORAL_TABLET | Freq: Every day | ORAL | Status: DC
  Administered 2023-12-16 – 2023-12-23 (×8): 75 mg via ORAL
  Filled 2023-12-16 (×8): qty 1

## 2023-12-16 NOTE — ED Notes (Signed)
Duncan, MD at bedside.

## 2023-12-16 NOTE — ED Notes (Signed)
 Assumed care of pt at this time. Pt on cardiac monitor, call light within reach, denies needs at this time.

## 2023-12-16 NOTE — Progress Notes (Addendum)
 PROGRESS NOTE    Molly Hodges  OVF:[id number] DOB: 1931-11-09 DOA: 12/15/2023 PCP: Lamon Pillow, MD   Assessment & Plan:   Principal Problem:   COPD with acute exacerbation [facility]) Active Problems:   Acute on chronic hypoxic respiratory failure (HCC)   COPD with acute bronchitis (HCC)  Assessment and Plan: COPD exacerbation: continue on IV steroids, bronchodilators & encourage incentive spirometry. CTA chest was neg for PE & pneumonia.  Possible asthma exacerbation: unknown severity and/or stage. Continue on steroids, bronchodilators & encourage incentive spirometry   Chronic hypoxic respiratory failure: continue on supplemental oxygen , uses 2L [place] at home chronically    Chronic diastolic CHF: w/  mod-severe mitral stenosis. Continue on home dose of torsemide . Monitor I/Os    PAF: continue on home dose of diltiazem , warfarin   Hx of CAD: continue on statin, zetia , plavix     Hx of PAD: continue on statin, plavix     HTN: continue on home dose of dilt       DVT prophylaxis: warfarin  Code Status: DNR Family Communication: discussed pt's care w/ pt's daughter, [name], and answered  Disposition Plan: likely d/c back home   Level of care: Progressive  Status is: Inpatient Remains inpatient appropriate because: severity of illness    Consultants:    Procedures:   Antimicrobials:   Subjective: Pt c/o shortness of breath  Objective: Vitals:   12/16/23 0041 12/16/23 0200 12/16/23 0407 12/16/23 0800  BP: 116/75 (!) 119/52 126/66 (!) 123/54  Pulse: 87 79 86 (!) 126  Resp: (!) 24 (!) 22 17 15   Temp:   98.7 F (37.1 C)   TempSrc:   Oral   SpO2: 97% 98% 99% 100%  Weight:      Height:        Intake/Output Summary (Last 24 hours) at 12/16/2023 1008 Last data filed at 12/16/2023 0408 Gross per 24 hour  Intake --  Output 850 ml  Net -850 ml   Filed Weights   12/15/23 1612  Weight: 54.3 kg    Examination:  General exam: Appears calm and comfortable   Respiratory system: Course breath sounds b/l Cardiovascular system: S1 & S2 +. No rubs, gallops or clicks. Gastrointestinal system: Abdomen is nondistended, soft and nontender.  Normal bowel sounds heard. Central nervous system: Alert and oriented.Moves all extremities  Psychiatry: Judgement and insight appear normal. Mood & affect appropriate.     Data Reviewed: I have personally reviewed following labs and imaging studies  CBC: Recent Labs  Lab 12/13/23 1633 12/14/23 0531 12/15/23 1620 12/16/23 0625  WBC 15.2* 12.9* 17.6* 12.3*  HGB 11.9* 11.5* 11.9* 11.6*  HCT 39.0 35.7* 37.7 36.6  MCV 73.6* 70.4* 71.8* 71.2*  PLT 180 165 223 209   Basic Metabolic Panel: Recent Labs  Lab 12/13/23 1633 12/14/23 0531 12/15/23 1620  NA 137 136 136  K 3.8 3.3* 4.2  CL 104 102 99  CO2 23 23 24   GLUCOSE 118* 270* 123*  BUN 21 23 39*  CREATININE 0.90 0.84 0.94  CALCIUM  8.3* 8.4* 8.6*   GFR: Estimated Creatinine Clearance: 34.1 mL/min (by C-G formula based on SCr of 0.94 mg/dL). Liver Function Tests: No results for input(s): "AST", "ALT", "ALKPHOS", "BILITOT", "PROT", "ALBUMIN" in the last 168 hours. No results for input(s): "LIPASE", "AMYLASE" in the last 168 hours. No results for input(s): "AMMONIA" in the last 168 hours. Coagulation Profile: Recent Labs  Lab 12/13/23 1721 12/14/23 0531 12/15/23 1620 12/16/23 0625  INR 2.2* 2.3* 2.8* 2.6*  Cardiac Enzymes: No results for input(s): "CKTOTAL", "CKMB", "CKMBINDEX", "TROPONINI" in the last 168 hours. BNP (last 3 results) No results for input(s): "PROBNP" in the last 8760 hours. HbA1C: No results for input(s): "HGBA1C" in the last 72 hours. CBG: No results for input(s): "GLUCAP" in the last 168 hours. Lipid Profile: No results for input(s): "CHOL", "HDL", "LDLCALC", "TRIG", "CHOLHDL", "LDLDIRECT" in the last 72 hours. Thyroid  Function Tests: No results for input(s): "TSH", "T4TOTAL", "FREET4", "T3FREE", "THYROIDAB" in the  last 72 hours. Anemia Panel: No results for input(s): "VITAMINB12", "FOLATE", "FERRITIN", "TIBC", "IRON", "RETICCTPCT" in the last 72 hours. Sepsis Labs: Recent Labs  Lab 12/13/23 1633 12/13/23 1721 12/15/23 1621  PROCALCITON <0.10  --   --   LATICACIDVEN  --  1.9 1.5    Recent Results (from the past 240 hours)  Resp panel by RT-PCR (RSV, Flu A&B, Covid) Anterior Nasal Swab     Status: None   Collection Time: 12/13/23  5:21 PM   Specimen: Anterior Nasal Swab  Result Value Ref Range Status   SARS Coronavirus 2 by RT PCR NEGATIVE NEGATIVE Final    Comment: (NOTE) SARS-CoV-2 target nucleic acids are NOT DETECTED.  The SARS-CoV-2 RNA is generally detectable in upper respiratory specimens during the acute phase of infection. The lowest concentration of SARS-CoV-2 viral copies this assay can detect is 138 copies/mL. A negative result does not preclude SARS-Cov-2 infection and should not be used as the sole basis for treatment or other patient management decisions. A negative result may occur with  improper specimen collection/handling, submission of specimen other than nasopharyngeal swab, presence of viral mutation(s) within the areas targeted by this assay, and inadequate number of viral copies(<138 copies/mL). A negative result must be combined with clinical observations, patient history, and epidemiological information. The expected result is Negative.  Fact Sheet for Patients:  BloggerCourse.com  Fact Sheet for Healthcare Providers:  SeriousBroker.it  This test is no t yet approved or cleared by the [place]  FDA and  has been authorized for detection and/or diagnosis of SARS-CoV-2 by FDA under an Emergency Use Authorization (EUA). This EUA will remain  in effect (meaning this test can be used) for the duration of the COVID-19 declaration under Section 564(b)(1) of the Act, 21 U.S.C.section 360bbb-3(b)(1), unless the  authorization is terminated  or revoked sooner.       Influenza A by PCR NEGATIVE NEGATIVE Final   Influenza B by PCR NEGATIVE NEGATIVE Final    Comment: (NOTE) The Xpert Xpress SARS-CoV-2/FLU/RSV plus assay is intended as an aid in the diagnosis of influenza from Nasopharyngeal swab specimens and should not be used as a sole basis for treatment. Nasal washings and aspirates are unacceptable for Xpert Xpress SARS-CoV-2/FLU/RSV testing.  Fact Sheet for Patients: BloggerCourse.com  Fact Sheet for Healthcare Providers: SeriousBroker.it  This test is not yet approved or cleared by the [place]  FDA and has been authorized for detection and/or diagnosis of SARS-CoV-2 by FDA under an Emergency Use Authorization (EUA). This EUA will remain in effect (meaning this test can be used) for the duration of the COVID-19 declaration under Section 564(b)(1) of the Act, 21 U.S.C. section 360bbb-3(b)(1), unless the authorization is terminated or revoked.     Resp Syncytial Virus by PCR NEGATIVE NEGATIVE Final    Comment: (NOTE) Fact Sheet for Patients: BloggerCourse.com  Fact Sheet for Healthcare Providers: SeriousBroker.it  This test is not yet approved or cleared by the [place]  FDA and has been authorized for detection and/or diagnosis  of SARS-CoV-2 by FDA under an Emergency Use Authorization (EUA). This EUA will remain in effect (meaning this test can be used) for the duration of the COVID-19 declaration under Section 564(b)(1) of the Act, 21 U.S.C. section 360bbb-3(b)(1), unless the authorization is terminated or revoked.  Performed at [facility], [address] [place], [place] [address]   Blood culture (routine x 2)     Status: None (Preliminary result)   Collection Time: 12/13/23  5:21 PM   Specimen: BLOOD  Result Value Ref Range Status   Specimen  Description BLOOD RIGHT ANTECUBITAL  Final   Special Requests   Final    BOTTLES DRAWN AEROBIC AND ANAEROBIC Blood Culture results may not be optimal due to an inadequate volume of blood received in culture bottles   Culture   Final    NO GROWTH 3 DAYS Performed at [facility], [address] [place], [place] [address]    Report Status PENDING  Incomplete  Blood culture (routine x 2)     Status: None (Preliminary result)   Collection Time: 12/13/23  8:43 PM   Specimen: BLOOD  Result Value Ref Range Status   Specimen Description BLOOD BLOOD LEFT ARM  Final   Special Requests   Final    BOTTLES DRAWN AEROBIC AND ANAEROBIC Blood Culture adequate volume   Culture   Final    NO GROWTH 3 DAYS Performed at [facility], [address], [place], [place] [address]    Report Status PENDING  Incomplete  Respiratory (~20 pathogens) panel by PCR     Status: Abnormal   Collection Time: 12/14/23 10:55 AM   Specimen: Nasopharyngeal Swab; Respiratory  Result Value Ref Range Status   Adenovirus NOT DETECTED NOT DETECTED Final   Coronavirus 229E NOT DETECTED NOT DETECTED Final    Comment: (NOTE) The Coronavirus on the Respiratory Panel, DOES NOT test for the novel  Coronavirus (2019 nCoV)    Coronavirus HKU1 NOT DETECTED NOT DETECTED Final   Coronavirus NL63 NOT DETECTED NOT DETECTED Final   Coronavirus OC43 NOT DETECTED NOT DETECTED Final   Metapneumovirus NOT DETECTED NOT DETECTED Final   Rhinovirus / Enterovirus DETECTED (A) NOT DETECTED Final   Influenza A NOT DETECTED NOT DETECTED Final   Influenza B NOT DETECTED NOT DETECTED Final   Parainfluenza Virus 1 NOT DETECTED NOT DETECTED Final   Parainfluenza Virus 2 NOT DETECTED NOT DETECTED Final   Parainfluenza Virus 3 NOT DETECTED NOT DETECTED Final   Parainfluenza Virus 4 NOT DETECTED NOT DETECTED Final   Respiratory Syncytial Virus NOT DETECTED NOT DETECTED Final   Bordetella pertussis NOT DETECTED NOT DETECTED Final    Bordetella Parapertussis NOT DETECTED NOT DETECTED Final   Chlamydophila pneumoniae NOT DETECTED NOT DETECTED Final   Mycoplasma pneumoniae NOT DETECTED NOT DETECTED Final    Comment: Performed at [facility], [address] [place], [place] [address]         Radiology Studies: CT Angio Chest PE W and/or Wo Contrast Result Date: 12/15/2023 EXAM: CTA of the Chest with contrast for PE 12/15/2023 09:37:30 PM TECHNIQUE: CTA of the chest was performed after the administration of intravenous contrast. Multiplanar reformatted images are provided for review. MIP images are provided for review. Automated exposure control, iterative reconstruction, and/or weight based adjustment of the mA/kV was utilized to reduce the radiation dose to as low as reasonably achievable. COMPARISON: Chest radiograph earlier today and CTA chest 01/26/2022. CLINICAL HISTORY: Pulmonary embolism (PE) suspected, high  prob. Patient comes in from home via pow with complaints of SOB. Pt was discharged from the hospital yesterday afternoon. Patient states that she slept good last night at home, but when she woke up this morning, she couldn't breath. Pt did a breathing treatment and a rescue inhaler with no relief. Pt has a history of afib, pneumonia, and is on blood thinners. Pt is alert and oriented x4, visibly lethargic, and shob with talking. FINDINGS: PULMONARY ARTERIES: Pulmonary arteries are adequately opacified for evaluation. No evidence of pulmonary embolism. Main pulmonary artery is normal in caliber. MEDIASTINUM: The heart and pericardium demonstrate no acute abnormality. Thoracic aortic atherosclerosis. LYMPH NODES: Stable mediastinal lymphadenopathy, including a dominant 16 mm short axis subcarinal node (image 68), favored to be reactive. LUNGS AND PLEURA: Bronchial wall thickening in the bilateral lower lobes, chronic peripheral subpleural scarring in the right lower lobe. Faint peribronchovascular ground-glass  opacity/scarring, chronic. Scattered calcified granulomata, benign. No evidence of pleural effusion or pneumothorax. UPPER ABDOMEN: Atherosclerotic calcifications of the abdominal aorta and branch vessels. SOFT TISSUES AND BONES: No acute bone or soft tissue abnormality. IMPRESSION: 1. No evidence of pulmonary embolism. 2. Cardiomegaly. 3. Stable mediastinal lymphadenopathy, favored to be reactive. 4. Scattered areas of scarring and bronchial wall thickening, chronic. Electronically signed by: Zadie Herter MD 12/15/2023 09:46 PM EDT RP Workstation: [id number]   DG Chest 2 View Result Date: 12/15/2023 CLINICAL DATA:  Shortness of breath. EXAM: CHEST - 2 VIEW COMPARISON:  12/13/2023 FINDINGS: Stable enlarged cardiac silhouette. Stable coronary artery stent. Tortuous and partially calcified thoracic aorta. Clear lungs with normal vascularity. The lungs remain hyperexpanded with mild-to-moderate peribronchial thickening. No significant change in mild scarring at the right lateral lung base. Diffuse osteopenia. Interval right anterior chest electronic device. IMPRESSION: 1. No acute abnormality. 2. Stable cardiomegaly. 3. Stable changes of COPD and chronic bronchitis. Electronically Signed   By: Catherin Closs M.D.   On: 12/15/2023 16:56        Scheduled Meds:  diltiazem   120 mg Oral Daily   ezetimibe   10 mg Oral Daily   guaiFENesin   600 mg Oral BID   ipratropium-albuterol   3 mL Nebulization Q6H   magnesium  oxide  400 mg Oral Daily   methylPREDNISolone  (SOLU-MEDROL ) injection  40 mg Intravenous Q12H   Followed by   Cecily Cohen ON 12/17/2023] predniSONE   40 mg Oral Q breakfast   potassium chloride   20 mEq Oral Daily   pregabalin   50 mg Oral BID   rosuvastatin   20 mg Oral Daily   torsemide   20 mg Oral Daily   Warfarin - Pharmacist Dosing Inpatient   Does not apply q1600   Continuous Infusions:   LOS: 0 days      Alphonsus Jeans, MD [facility] Pager [phone]  If 7PM-7AM, please  contact night-coverage [email or web address] 12/16/2023, 10:08 AM

## 2023-12-16 NOTE — ED Notes (Signed)
 Pt called for assistance to bedside commode. Output measured approximately 300 mL. Pt assisted back in bed, denies needs at this time. Call light within reach.

## 2023-12-16 NOTE — ED Notes (Signed)
 Pt consumer 90% of breakfast tray.

## 2023-12-16 NOTE — Consult Note (Signed)
 PHARMACY - ANTICOAGULATION CONSULT NOTE  Pharmacy Consult for warfarin Indication: atrial fibrillation  Allergies  Allergen Reactions   Shellfish Allergy Anaphylaxis    Other reaction(s): Hallucination   Azithromycin  Other (See Comments)    Extreme burning sensation at IV site    Tamiflu [Oseltamivir Phosphate] Other (See Comments)    Reaction:  Hallucinations     Albuterol  Palpitations    Heart racing.    Albuterol  And Levalbuterol  Palpitations    Patient Measurements: Height: 5\' 5"  (165.1 cm) Weight: 54.3 kg (119 lb 11.4 oz) IBW/kg (Calculated) : 57 HEPARIN  DW (KG): 54.3  Vital Signs: Temp: 98.7 F (37.1 C) (04/30 0407) Temp Source: Oral (04/30 0407) BP: 123/54 (04/30 0800) Pulse Rate: 126 (04/30 0800)  Labs: Recent Labs    12/13/23 1633 12/13/23 1721 12/14/23 0531 12/15/23 1620 12/16/23 0625  HGB 11.9*  --  11.5* 11.9* 11.6*  HCT 39.0  --  35.7* 37.7 36.6  PLT 180  --  165 223 209  LABPROT  --    < > 25.6* 29.6* 28.1*  INR  --    < > 2.3* 2.8* 2.6*  CREATININE 0.90  --  0.84 0.94  --   TROPONINIHS 12  --   --   --   --    < > = values in this interval not displayed.    Estimated Creatinine Clearance: 34.1 mL/min (by C-G formula based on SCr of 0.94 mg/dL).   Medical History: Past Medical History:  Diagnosis Date   Aortic atherosclerosis (HCC)    a. 05/2021 TEE: GrIII atheroma plaque involving the asc, transverse, and desc Ao.   Asthma    CAD (coronary artery disease)    a. 04/2021 Cath: LM nl, LAD 85p/m, D1 80, RI nl, LCX nl, RCA nl; b. 07/2021 PCI: pLAD (2.75x26 Onyx Frontier DES), D1 (2.5x22 Onyx Frontier DES).   Carotid artery disease (HCC)    a. s/p R carotid stenting (9mm x 7mm x 4cm long Exact stent); b. 06/2021 U/S: RICA 40-59%, LICA 40-59%.   CHF (congestive heart failure) (HCC)    Community acquired pneumonia    Essential hypertension    Glaucoma    Hyperlipidemia    Mitral regurgitation    a. TTE 08/2015: EF 60-65%, normal wall motion,  mild MR, mildly dilated left atrium measuring 40 mm, RVSF normal, PASP normal; b. 05/2021 TEE: Moderate MR.   Mitral stenosis    a. 05/2021 L/RHC: Sev MS w/ mean grad 13-10mmHg and MV area of 0.5-06.cm^2; b. 05/2021 TEE: EF 55-60%, no rwma, nl RV fxn, mod MR, mod MS (MV area by P1/2t: 1.61 cm^2 w/ mean grad of ).   Peripheral neuropathy    Persistent atrial fibrillation (HCC) 09/17/2015   a. s/p DCCV 11/15/2015; b. CHADS2VASc => 4 (HTN, age x 2, female)--> warfarin; c. 06/2021 recurrent afib-->amio added.   Squamous cell carcinoma of skin 12/18/2021   R dorsum hand, EDC   Squamous cell carcinoma of skin 12/31/2021   R hand dorsum, recurrent - excised 02/04/2022   Squamous cell carcinoma of skin 12/31/2021   L forearm - ED&C   Squamous cell carcinoma of skin 06/24/2022   R thumb webspace with wart virus - ED&C   Squamous cell carcinoma of skin 10/20/2022   L lat knee - tx with ED&C    Medications:  Home Dose: Warfarin 2.5 mg PO MWF, all other days 3.75 mg (has 2.5 mg tabs at home)   Assessment: 89 yo female with PMH  including asthma/COPD complicated by chronic hypoxic respiratory failure on 2 L with exertion, severe mitral stenosis, HFpEF, atrial fibrillation on warfarin, hypertension, hyperlipidemia, hypothyroidism, CAD s/p PCI to proximal LAD (2022), carotid artery disease s/p right carotid stenting. Most recent admission 12/13/2023 with acute asthma and HF exacerbation. Pharmacy consulted to manage warfarin while inpatient.  Date INR Warfarin dose Comments  4/28 2.3 3mg  Discharge home  4/29 2.8  Re-admission  4/30 2.6 3mg          Goal of Therapy:  INR 2-3, with mitral valve stenosis, will try to aim for a goal of 2.5 to 3.    Plan:  Start warfarin 3mg  po daily Continue to check INR with AM labs  Tonae Livolsi Rodriguez-Guzman PharmD, BCPS 12/16/2023 10:41 AM

## 2023-12-17 DIAGNOSIS — J441 Chronic obstructive pulmonary disease with (acute) exacerbation: Secondary | ICD-10-CM | POA: Diagnosis not present

## 2023-12-17 LAB — BASIC METABOLIC PANEL WITH GFR
Anion gap: 8 (ref 5–15)
BUN: 33 mg/dL — ABNORMAL HIGH (ref 8–23)
CO2: 26 mmol/L (ref 22–32)
Calcium: 8.5 mg/dL — ABNORMAL LOW (ref 8.9–10.3)
Chloride: 103 mmol/L (ref 98–111)
Creatinine, Ser: 0.9 mg/dL (ref 0.44–1.00)
GFR, Estimated: >60 mL/min (ref >60)
Glucose, Bld: 220 mg/dL — ABNORMAL HIGH (ref 70–99)
Potassium: 4.4 mmol/L (ref 3.5–5.1)
Sodium: 137 mmol/L (ref 135–145)

## 2023-12-17 LAB — CBC
HCT: 35.9 % — ABNORMAL LOW (ref 36.0–46.0)
Hemoglobin: 11.4 g/dL — ABNORMAL LOW (ref 12.0–15.0)
MCH: 22.5 pg — ABNORMAL LOW (ref 26.0–34.0)
MCHC: 31.8 g/dL (ref 30.0–36.0)
MCV: 70.8 fL — ABNORMAL LOW (ref 80.0–100.0)
Platelets: 231 10*3/uL (ref 150–400)
RBC: 5.07 MIL/uL (ref 3.87–5.11)
RDW: 17.4 % — ABNORMAL HIGH (ref 11.5–15.5)
WBC: 16.7 10*3/uL — ABNORMAL HIGH (ref 4.0–10.5)
nRBC: 0 % (ref 0.0–0.2)

## 2023-12-17 LAB — PROTIME-INR
INR: 2.6 — ABNORMAL HIGH (ref 0.8–1.2)
Prothrombin Time: 28.3 s — ABNORMAL HIGH (ref 11.4–15.2)

## 2023-12-17 MED ORDER — GUAIFENESIN-DM 100-10 MG/5ML PO SYRP
5.0000 mL | ORAL_SOLUTION | ORAL | Status: DC | PRN
  Filled 2023-12-17: qty 10

## 2023-12-17 NOTE — Progress Notes (Signed)
 PROGRESS NOTE    Molly Hodges  ZOX:[id number] DOB: 06-25-1932 DOA: 12/15/2023 PCP: Lamon Pillow, MD   Assessment & Plan:   Principal Problem:   COPD with acute exacerbation [facility]) Active Problems:   Acute on chronic hypoxic respiratory failure (HCC)   COPD with acute bronchitis (HCC)   COPD exacerbation (HCC)  Assessment and Plan: COPD exacerbation: continue on IV steroids, bronchodilators & encourage incentive spirometry. CTA chest was neg for PE & pneumonia.  Possible asthma exacerbation: unknown severity and/or stage. Continue on steroids, bronchodilators & encourage incentive spirometry   Chronic hypoxic respiratory failure: continue on supplemental oxygen , uses 2L [place] at home chronically    Chronic diastolic CHF: w/  mod-severe mitral stenosis. Continue on home dose of torsemide . Monitor I/Os    PAF: continue on home dose of warfarin, diltiazem    Hx of CAD: continue on plavix , statin, zetia      Hx of PAD: continue on statin, plavix    HTN: continue on home dose of dilt       DVT prophylaxis: warfarin  Code Status: DNR Family Communication: discussed pt's care w/ pt's daughter, [name], and answered her questions  Disposition Plan: likely d/c back home   Level of care: Progressive  Status is: Inpatient Remains inpatient appropriate because: severity of illness    Consultants:    Procedures:   Antimicrobials:   Subjective: Pt c/o chest tightness   Objective: Vitals:   12/16/23 2256 12/16/23 2312 12/17/23 0517 12/17/23 0744  BP: (!) 130/58  (!) 111/59 (!) 120/57  Pulse: 76  65 75  Resp: 18  18   Temp: 97.7 F (36.5 C)  97.7 F (36.5 C) 98.1 F (36.7 C)  TempSrc: Oral  Oral   SpO2: 98% 98% 100% 98%  Weight:      Height:        Intake/Output Summary (Last 24 hours) at 12/17/2023 0907 Last data filed at 12/17/2023 0534 Gross per 24 hour  Intake 100 ml  Output 600 ml  Net -500 ml   Filed Weights   12/15/23 1612 12/16/23 2200  Weight:  54.3 kg 59.2 kg    Examination:  General exam: Appears calm but uncomfortable  Respiratory system: course breath sounds b/l  Cardiovascular system: S1/S2+. No rubs or clicks  Gastrointestinal system: abd is soft, NT, ND & hypoactive bowel sounds  Central nervous system: alert & oriented. Moves all extremities  Psychiatry: Judgement and insight appears at baseline. Appropriate mood and affect     Data Reviewed: I have personally reviewed following labs and imaging studies  CBC: Recent Labs  Lab 12/13/23 1633 12/14/23 0531 12/15/23 1620 12/16/23 0625 12/17/23 0415  WBC 15.2* 12.9* 17.6* 12.3* 16.7*  HGB 11.9* 11.5* 11.9* 11.6* 11.4*  HCT 39.0 35.7* 37.7 36.6 35.9*  MCV 73.6* 70.4* 71.8* 71.2* 70.8*  PLT 180 165 223 209 231   Basic Metabolic Panel: Recent Labs  Lab 12/13/23 1633 12/14/23 0531 12/15/23 1620 12/17/23 0415  NA 137 136 136 137  K 3.8 3.3* 4.2 4.4  CL 104 102 99 103  CO2 23 23 24 26   GLUCOSE 118* 270* 123* 220*  BUN 21 23 39* 33*  CREATININE 0.90 0.84 0.94 0.90  CALCIUM  8.3* 8.4* 8.6* 8.5*   GFR: Estimated Creatinine Clearance: 37.4 mL/min (by C-G formula based on SCr of 0.9 mg/dL). Liver Function Tests: No results for input(s): "AST", "ALT", "ALKPHOS", "BILITOT", "PROT", "ALBUMIN" in the last 168 hours. No results for input(s): "LIPASE", "AMYLASE" in the last  168 hours. No results for input(s): "AMMONIA" in the last 168 hours. Coagulation Profile: Recent Labs  Lab 12/13/23 1721 12/14/23 0531 12/15/23 1620 12/16/23 0625 12/17/23 0415  INR 2.2* 2.3* 2.8* 2.6* 2.6*   Cardiac Enzymes: No results for input(s): "CKTOTAL", "CKMB", "CKMBINDEX", "TROPONINI" in the last 168 hours. BNP (last 3 results) No results for input(s): "PROBNP" in the last 8760 hours. HbA1C: No results for input(s): "HGBA1C" in the last 72 hours. CBG: No results for input(s): "GLUCAP" in the last 168 hours. Lipid Profile: No results for input(s): "CHOL", "HDL", "LDLCALC",  "TRIG", "CHOLHDL", "LDLDIRECT" in the last 72 hours. Thyroid  Function Tests: No results for input(s): "TSH", "T4TOTAL", "FREET4", "T3FREE", "THYROIDAB" in the last 72 hours. Anemia Panel: No results for input(s): "VITAMINB12", "FOLATE", "FERRITIN", "TIBC", "IRON", "RETICCTPCT" in the last 72 hours. Sepsis Labs: Recent Labs  Lab 12/13/23 1633 12/13/23 1721 12/15/23 1621  PROCALCITON <0.10  --   --   LATICACIDVEN  --  1.9 1.5    Recent Results (from the past 240 hours)  Resp panel by RT-PCR (RSV, Flu A&B, Covid) Anterior Nasal Swab     Status: None   Collection Time: 12/13/23  5:21 PM   Specimen: Anterior Nasal Swab  Result Value Ref Range Status   SARS Coronavirus 2 by RT PCR NEGATIVE NEGATIVE Final    Comment: (NOTE) SARS-CoV-2 target nucleic acids are NOT DETECTED.  The SARS-CoV-2 RNA is generally detectable in upper respiratory specimens during the acute phase of infection. The lowest concentration of SARS-CoV-2 viral copies this assay can detect is 138 copies/mL. A negative result does not preclude SARS-Cov-2 infection and should not be used as the sole basis for treatment or other patient management decisions. A negative result may occur with  improper specimen collection/handling, submission of specimen other than nasopharyngeal swab, presence of viral mutation(s) within the areas targeted by this assay, and inadequate number of viral copies(<138 copies/mL). A negative result must be combined with clinical observations, patient history, and epidemiological information. The expected result is Negative.  Fact Sheet for Patients:  BloggerCourse.com  Fact Sheet for Healthcare Providers:  SeriousBroker.it  This test is no t yet approved or cleared by the [place]  FDA and  has been authorized for detection and/or diagnosis of SARS-CoV-2 by FDA under an Emergency Use Authorization (EUA). This EUA will remain  in effect  (meaning this test can be used) for the duration of the COVID-19 declaration under Section 564(b)(1) of the Act, 21 U.S.C.section 360bbb-3(b)(1), unless the authorization is terminated  or revoked sooner.       Influenza A by PCR NEGATIVE NEGATIVE Final   Influenza B by PCR NEGATIVE NEGATIVE Final    Comment: (NOTE) The Xpert Xpress SARS-CoV-2/FLU/RSV plus assay is intended as an aid in the diagnosis of influenza from Nasopharyngeal swab specimens and should not be used as a sole basis for treatment. Nasal washings and aspirates are unacceptable for Xpert Xpress SARS-CoV-2/FLU/RSV testing.  Fact Sheet for Patients: BloggerCourse.com  Fact Sheet for Healthcare Providers: SeriousBroker.it  This test is not yet approved or cleared by the [place]  FDA and has been authorized for detection and/or diagnosis of SARS-CoV-2 by FDA under an Emergency Use Authorization (EUA). This EUA will remain in effect (meaning this test can be used) for the duration of the COVID-19 declaration under Section 564(b)(1) of the Act, 21 U.S.C. section 360bbb-3(b)(1), unless the authorization is terminated or revoked.     Resp Syncytial Virus by PCR NEGATIVE NEGATIVE Final  Comment: (NOTE) Fact Sheet for Patients: BloggerCourse.com  Fact Sheet for Healthcare Providers: SeriousBroker.it  This test is not yet approved or cleared by the [place]  FDA and has been authorized for detection and/or diagnosis of SARS-CoV-2 by FDA under an Emergency Use Authorization (EUA). This EUA will remain in effect (meaning this test can be used) for the duration of the COVID-19 declaration under Section 564(b)(1) of the Act, 21 U.S.C. section 360bbb-3(b)(1), unless the authorization is terminated or revoked.  Performed at [facility], [address] [place], [place] [address]   Blood culture  (routine x 2)     Status: None (Preliminary result)   Collection Time: 12/13/23  5:21 PM   Specimen: BLOOD  Result Value Ref Range Status   Specimen Description BLOOD RIGHT ANTECUBITAL  Final   Special Requests   Final    BOTTLES DRAWN AEROBIC AND ANAEROBIC Blood Culture results may not be optimal due to an inadequate volume of blood received in culture bottles   Culture   Final    NO GROWTH 4 DAYS Performed at [facility], [address] [place], [place] [address]    Report Status PENDING  Incomplete  Blood culture (routine x 2)     Status: None (Preliminary result)   Collection Time: 12/13/23  8:43 PM   Specimen: BLOOD  Result Value Ref Range Status   Specimen Description BLOOD BLOOD LEFT ARM  Final   Special Requests   Final    BOTTLES DRAWN AEROBIC AND ANAEROBIC Blood Culture adequate volume   Culture   Final    NO GROWTH 4 DAYS Performed at [facility], [address], [place], [place] [address]    Report Status PENDING  Incomplete  Respiratory (~20 pathogens) panel by PCR     Status: Abnormal   Collection Time: 12/14/23 10:55 AM   Specimen: Nasopharyngeal Swab; Respiratory  Result Value Ref Range Status   Adenovirus NOT DETECTED NOT DETECTED Final   Coronavirus 229E NOT DETECTED NOT DETECTED Final    Comment: (NOTE) The Coronavirus on the Respiratory Panel, DOES NOT test for the novel  Coronavirus (2019 nCoV)    Coronavirus HKU1 NOT DETECTED NOT DETECTED Final   Coronavirus NL63 NOT DETECTED NOT DETECTED Final   Coronavirus OC43 NOT DETECTED NOT DETECTED Final   Metapneumovirus NOT DETECTED NOT DETECTED Final   Rhinovirus / Enterovirus DETECTED (A) NOT DETECTED Final   Influenza A NOT DETECTED NOT DETECTED Final   Influenza B NOT DETECTED NOT DETECTED Final   Parainfluenza Virus 1 NOT DETECTED NOT DETECTED Final   Parainfluenza Virus 2 NOT DETECTED NOT DETECTED Final   Parainfluenza Virus 3 NOT DETECTED NOT DETECTED Final   Parainfluenza Virus  4 NOT DETECTED NOT DETECTED Final   Respiratory Syncytial Virus NOT DETECTED NOT DETECTED Final   Bordetella pertussis NOT DETECTED NOT DETECTED Final   Bordetella Parapertussis NOT DETECTED NOT DETECTED Final   Chlamydophila pneumoniae NOT DETECTED NOT DETECTED Final   Mycoplasma pneumoniae NOT DETECTED NOT DETECTED Final    Comment: Performed at [facility], [address], [place], [place] [address]         Radiology Studies: CT Angio Chest PE W and/or Wo Contrast Result Date: 12/15/2023 EXAM: CTA of the Chest with contrast for PE 12/15/2023 09:37:30 PM TECHNIQUE: CTA of the chest was performed after the administration of intravenous contrast. Multiplanar reformatted images are provided for review. MIP images are provided for review. Automated exposure control, iterative reconstruction,  and/or weight based adjustment of the mA/kV was utilized to reduce the radiation dose to as low as reasonably achievable. COMPARISON: Chest radiograph earlier today and CTA chest 01/26/2022. CLINICAL HISTORY: Pulmonary embolism (PE) suspected, high prob. Patient comes in from home via pow with complaints of SOB. Pt was discharged from the hospital yesterday afternoon. Patient states that she slept good last night at home, but when she woke up this morning, she couldn't breath. Pt did a breathing treatment and a rescue inhaler with no relief. Pt has a history of afib, pneumonia, and is on blood thinners. Pt is alert and oriented x4, visibly lethargic, and shob with talking. FINDINGS: PULMONARY ARTERIES: Pulmonary arteries are adequately opacified for evaluation. No evidence of pulmonary embolism. Main pulmonary artery is normal in caliber. MEDIASTINUM: The heart and pericardium demonstrate no acute abnormality. Thoracic aortic atherosclerosis. LYMPH NODES: Stable mediastinal lymphadenopathy, including a dominant 16 mm short axis subcarinal node (image 68), favored to be reactive. LUNGS AND PLEURA: Bronchial  wall thickening in the bilateral lower lobes, chronic peripheral subpleural scarring in the right lower lobe. Faint peribronchovascular ground-glass opacity/scarring, chronic. Scattered calcified granulomata, benign. No evidence of pleural effusion or pneumothorax. UPPER ABDOMEN: Atherosclerotic calcifications of the abdominal aorta and branch vessels. SOFT TISSUES AND BONES: No acute bone or soft tissue abnormality. IMPRESSION: 1. No evidence of pulmonary embolism. 2. Cardiomegaly. 3. Stable mediastinal lymphadenopathy, favored to be reactive. 4. Scattered areas of scarring and bronchial wall thickening, chronic. Electronically signed by: Zadie Herter MD 12/15/2023 09:46 PM EDT RP Workstation: [id number]   DG Chest 2 View Result Date: 12/15/2023 CLINICAL DATA:  Shortness of breath. EXAM: CHEST - 2 VIEW COMPARISON:  12/13/2023 FINDINGS: Stable enlarged cardiac silhouette. Stable coronary artery stent. Tortuous and partially calcified thoracic aorta. Clear lungs with normal vascularity. The lungs remain hyperexpanded with mild-to-moderate peribronchial thickening. No significant change in mild scarring at the right lateral lung base. Diffuse osteopenia. Interval right anterior chest electronic device. IMPRESSION: 1. No acute abnormality. 2. Stable cardiomegaly. 3. Stable changes of COPD and chronic bronchitis. Electronically Signed   By: Catherin Closs M.D.   On: 12/15/2023 16:56        Scheduled Meds:  clopidogrel   75 mg Oral Daily   diltiazem   120 mg Oral Daily   ezetimibe   10 mg Oral Daily   guaiFENesin   600 mg Oral BID   ipratropium-albuterol   3 mL Nebulization Q6H   magnesium  oxide  400 mg Oral Daily   methylPREDNISolone  (SOLU-MEDROL ) injection  40 mg Intravenous Q12H   potassium chloride   20 mEq Oral Daily   pregabalin   50 mg Oral BID   rosuvastatin   20 mg Oral Daily   torsemide   20 mg Oral Daily   warfarin  3 mg Oral q1600   Warfarin - Pharmacist Dosing Inpatient   Does not apply q1600    Continuous Infusions:   LOS: 1 day      Alphonsus Jeans, MD [facility] Pager [phone]  If 7PM-7AM, please contact night-coverage [email or web address] 12/17/2023, 9:07 AM

## 2023-12-17 NOTE — Plan of Care (Signed)
  Problem: Education: Goal: Knowledge of General Education information will improve Description: Including pain rating scale, medication(s)/side effects and non-pharmacologic comfort measures Outcome: Progressing   Problem: Health Behavior/Discharge Planning: Goal: Ability to manage health-related needs will improve Outcome: Progressing   Problem: Clinical Measurements: Goal: Ability to maintain clinical measurements within normal limits will improve Outcome: Progressing   Problem: Clinical Measurements: Goal: Will remain free from infection Outcome: Progressing   Problem: Clinical Measurements: Goal: Respiratory complications will improve Outcome: Progressing   Problem: Activity: Goal: Risk for activity intolerance will decrease Outcome: Progressing   Plan of care ongoing see MAR see flowsheet

## 2023-12-17 NOTE — Plan of Care (Signed)
  Problem: Education: Goal: Knowledge of General Education information will improve Description: Including pain rating scale, medication(s)/side effects and non-pharmacologic comfort measures Outcome: Progressing   Problem: Clinical Measurements: Goal: Will remain free from infection Outcome: Progressing   Problem: Clinical Measurements: Goal: Respiratory complications will improve Outcome: Progressing   Problem: Clinical Measurements: Goal: Cardiovascular complication will be avoided Outcome: Progressing   Problem: Activity: Goal: Risk for activity intolerance will decrease Outcome: Progressing   Problem: Elimination: Goal: Will not experience complications related to urinary retention Outcome: Progressing   Problem: Pain Managment: Goal: General experience of comfort will improve and/or be controlled Outcome: Progressing   Problem: Safety: Goal: Ability to remain free from injury will improve Outcome: Progressing

## 2023-12-17 NOTE — Consult Note (Addendum)
 PHARMACY - ANTICOAGULATION CONSULT NOTE  Pharmacy Consult for warfarin Indication: atrial fibrillation  Allergies  Allergen Reactions   Shellfish Allergy Anaphylaxis    Other reaction(s): Hallucination   Azithromycin  Other (See Comments)    Extreme burning sensation at IV site    Tamiflu [Oseltamivir Phosphate] Other (See Comments)    Reaction:  Hallucinations     Albuterol  Palpitations    Heart racing.    Albuterol  And Levalbuterol  Palpitations    Patient Measurements: Height: 5\' 5"  (165.1 cm) Weight: 59.2 kg (130 lb 8.2 oz) IBW/kg (Calculated) : 57 HEPARIN  DW (KG): 59.2  Vital Signs: Temp: 98.1 F (36.7 C) (05/01 0744) Temp Source: Oral (05/01 0517) BP: 120/57 (05/01 0744) Pulse Rate: 75 (05/01 0744)  Labs: Recent Labs    12/15/23 1620 12/16/23 0625 12/17/23 0415  HGB 11.9* 11.6* 11.4*  HCT 37.7 36.6 35.9*  PLT 223 209 231  LABPROT 29.6* 28.1* 28.3*  INR 2.8* 2.6* 2.6*  CREATININE 0.94  --  0.90    Estimated Creatinine Clearance: 37.4 mL/min (by C-G formula based on SCr of 0.9 mg/dL).   Medical History: Past Medical History:  Diagnosis Date   Aortic atherosclerosis (HCC)    a. 05/2021 TEE: GrIII atheroma plaque involving the asc, transverse, and desc Ao.   Asthma    CAD (coronary artery disease)    a. 04/2021 Cath: LM nl, LAD 85p/m, D1 80, RI nl, LCX nl, RCA nl; b. 07/2021 PCI: pLAD (2.75x26 Onyx Frontier DES), D1 (2.5x22 Onyx Frontier DES).   Carotid artery disease (HCC)    a. s/p R carotid stenting (9mm x 7mm x 4cm long Exact stent); b. 06/2021 U/S: RICA 40-59%, LICA 40-59%.   CHF (congestive heart failure) (HCC)    Community acquired pneumonia    Essential hypertension    Glaucoma    Hyperlipidemia    Mitral regurgitation    a. TTE 08/2015: EF 60-65%, normal wall motion, mild MR, mildly dilated left atrium measuring 40 mm, RVSF normal, PASP normal; b. 05/2021 TEE: Moderate MR.   Mitral stenosis    a. 05/2021 L/RHC: Sev MS w/ mean grad 13-26mmHg  and MV area of 0.5-06.cm^2; b. 05/2021 TEE: EF 55-60%, no rwma, nl RV fxn, mod MR, mod MS (MV area by P1/2t: 1.61 cm^2 w/ mean grad of ).   Peripheral neuropathy    Persistent atrial fibrillation (HCC) 09/17/2015   a. s/p DCCV 11/15/2015; b. CHADS2VASc => 4 (HTN, age x 2, female)--> warfarin; c. 06/2021 recurrent afib-->amio added.   Squamous cell carcinoma of skin 12/18/2021   R dorsum hand, EDC   Squamous cell carcinoma of skin 12/31/2021   R hand dorsum, recurrent - excised 02/04/2022   Squamous cell carcinoma of skin 12/31/2021   L forearm - ED&C   Squamous cell carcinoma of skin 06/24/2022   R thumb webspace with wart virus - ED&C   Squamous cell carcinoma of skin 10/20/2022   L lat knee - tx with ED&C    Medications:  Home Dose: Warfarin 2.5 mg PO MWF, all other days 3.75 mg (has 2.5 mg tabs at home)   Assessment: 89 yo female with PMH including asthma/COPD complicated by chronic hypoxic respiratory failure on 2 L with exertion, severe mitral stenosis, HFpEF, atrial fibrillation on warfarin, hypertension, hyperlipidemia, hypothyroidism, CAD s/p PCI to proximal LAD (2022), carotid artery disease s/p right carotid stenting. Most recent admission 12/13/2023 with acute asthma and HF exacerbation. Pharmacy consulted to manage warfarin while inpatient.   -Interacting medications/conditions: clopidogrel   may increase risk of bleeding and chronic heart failure may falsely elevate INR due to potential fluid overload.  12/18/23: INR increased from 2.6 to 3.0, reaching the upper limit of the therapeutic target range. BUN is elevated at 36. Chest X-ray and CT imaging do not mention edema. No extra IV fluids have been given nor is there any note of fluid overload in patient chart that can contribute to an elevated INR.  Date INR Warfarin dose Comments  4/28 2.3 3mg  Discharge home  4/29 2.8  Re-admission  4/30 2.6 3mg    5/01 2.6 3mg    5/02 3.0 2.5mg     Goal of Therapy:  INR 2-3, with  mitral valve stenosis, will try to aim for a goal of 2.5 to 3.   Plan:  Decrease warfarin 3mg  to 2.5 mg today Continue to check INR with AM labs  Ara Knee, PharmD Candidate 12/17/2023 9:37 AM

## 2023-12-18 DIAGNOSIS — J441 Chronic obstructive pulmonary disease with (acute) exacerbation: Secondary | ICD-10-CM | POA: Diagnosis not present

## 2023-12-18 LAB — CBC
HCT: 35.4 % — ABNORMAL LOW (ref 36.0–46.0)
Hemoglobin: 11.1 g/dL — ABNORMAL LOW (ref 12.0–15.0)
MCH: 22.2 pg — ABNORMAL LOW (ref 26.0–34.0)
MCHC: 31.4 g/dL (ref 30.0–36.0)
MCV: 70.8 fL — ABNORMAL LOW (ref 80.0–100.0)
Platelets: 223 10*3/uL (ref 150–400)
RBC: 5 MIL/uL (ref 3.87–5.11)
RDW: 17.3 % — ABNORMAL HIGH (ref 11.5–15.5)
WBC: 19 10*3/uL — ABNORMAL HIGH (ref 4.0–10.5)
nRBC: 0 % (ref 0.0–0.2)

## 2023-12-18 LAB — BASIC METABOLIC PANEL WITH GFR
Anion gap: 9 (ref 5–15)
BUN: 36 mg/dL — ABNORMAL HIGH (ref 8–23)
CO2: 24 mmol/L (ref 22–32)
Calcium: 8.4 mg/dL — ABNORMAL LOW (ref 8.9–10.3)
Chloride: 98 mmol/L (ref 98–111)
Creatinine, Ser: 0.92 mg/dL (ref 0.44–1.00)
GFR, Estimated: 59 mL/min — ABNORMAL LOW (ref >60)
Glucose, Bld: 252 mg/dL — ABNORMAL HIGH (ref 70–99)
Potassium: 4.3 mmol/L (ref 3.5–5.1)
Sodium: 131 mmol/L — ABNORMAL LOW (ref 135–145)

## 2023-12-18 LAB — CULTURE, BLOOD (ROUTINE X 2)
Culture: NO GROWTH
Culture: NO GROWTH
Special Requests: ADEQUATE

## 2023-12-18 LAB — PROTIME-INR
INR: 3 — ABNORMAL HIGH (ref 0.8–1.2)
Prothrombin Time: 31.4 s — ABNORMAL HIGH (ref 11.4–15.2)

## 2023-12-18 MED ORDER — METHYLPREDNISOLONE SODIUM SUCC 125 MG IJ SOLR
60.0000 mg | Freq: Two times a day (BID) | INTRAMUSCULAR | Status: DC
  Administered 2023-12-18 – 2023-12-21 (×6): 60 mg via INTRAVENOUS
  Filled 2023-12-18 (×6): qty 2

## 2023-12-18 MED ORDER — WARFARIN SODIUM 2.5 MG PO TABS
2.5000 mg | ORAL_TABLET | Freq: Once | ORAL | Status: AC
  Administered 2023-12-18: 2.5 mg via ORAL
  Filled 2023-12-18: qty 1

## 2023-12-18 NOTE — Evaluation (Signed)
 Occupational Therapy Evaluation Patient Details Name: Molly Hodges MRN: [id number] DOB: 1931-09-15 Today's Date: 12/18/2023   History of Present Illness   Molly Hodges is a 89 y.o. female with medical history significant for asthma/COPD complicated by chronic hypoxic respiratory failure on 2 L with exertion, severe mitral stenosis, HFpEF, atrial fibrillation on warfarin, hypertension, hyperlipidemia, hypothyroidism, CAD s/p PCI to proximal LAD (2022), carotid artery disease s/p right carotid stenting hospitalized from 4/27 to 12/14/23 with acute on chronic respiratory failure secondary to asthma and CHF exacerbation, who returns to the ED with shortness of breath.    Clinical Impressions Molly Hodges was seen for OT evaluation this date. Prior to hospital admission, pt was IND using RW as needed. Pt lives with spouse who is 89 yo. Pt currently requires SUPERVISION + RW for toilet t/f and standing grooming tasks with SpO2 97% on 2L [place]. Pt appears near baseline, will maintain on caseload for energy conservation strategy education and to address activity tolerance. Upon hospital discharge, recommend no OT follow up.      If plan is discharge home, recommend the following:   Assist for transportation     Functional Status Assessment   Patient has had a recent decline in their functional status and demonstrates the ability to make significant improvements in function in a reasonable and predictable amount of time.     Equipment Recommendations   BSC/3in1     Recommendations for Other Services         Precautions/Restrictions   Precautions Precautions: None Recall of Precautions/Restrictions: Intact Restrictions Weight Bearing Restrictions Per Provider Order: No     Mobility Bed Mobility Overal bed mobility: Modified Independent                  Transfers Overall transfer level: Modified independent Equipment used: Rolling walker (2 wheels)                       Balance Overall balance assessment: No apparent balance deficits (not formally assessed)                                         ADL either performed or assessed with clinical judgement   ADL Overall ADL's : Needs assistance/impaired                                       General ADL Comments: SUPERVISION + RW for toilet t/f and standing grooming tasks with SpO2 97% on 2L [place]      Pertinent Vitals/Pain Pain Assessment Pain Assessment: No/denies pain     Extremity/Trunk Assessment Upper Extremity Assessment Upper Extremity Assessment: Overall WFL for tasks assessed   Lower Extremity Assessment Lower Extremity Assessment: Generalized weakness       Communication Communication Communication: No apparent difficulties   Cognition Arousal: Alert Behavior During Therapy: WFL for tasks assessed/performed Cognition: No apparent impairments                               Following commands: Intact       Cueing  General Comments   Cueing Techniques: Verbal cues      Exercises     Shoulder Instructions      Home Living Family/patient  expects to be discharged to:: Private residence Living Arrangements: Spouse/significant other Available Help at Discharge: Family Type of Home: House Home Access: Level entry     Home Layout: One level     Bathroom Shower/Tub: Dietitian: Rollator (4 wheels);Rolling Walker (2 wheels);Wheelchair - manual          Prior Functioning/Environment Prior Level of Function : Independent/Modified Independent             Mobility Comments: usually no device for ambulation but intermittent use of 2 wheeled walker with dyspnea      OT Problem List: Decreased strength;Decreased activity tolerance;Cardiopulmonary status limiting activity   OT Treatment/Interventions: Self-care/ADL training;Therapeutic exercise;Neuromuscular  education;Visual/perceptual remediation/compensation;Patient/family education;Energy conservation;Balance training      OT Goals(Current goals can be found in the care plan section)   Acute Rehab OT Goals Patient Stated Goal: to go home OT Goal Formulation: With patient Time For Goal Achievement: 01/01/24 Potential to Achieve Goals: Good ADL Goals Pt Will Perform Tub/Shower Transfer: Shower transfer;Independently;ambulating Pt/caregiver will Perform Home Exercise Program: Increased strength;Both right and left upper extremity;Independently   OT Frequency:  Min 1X/week    Co-evaluation              AM-PAC OT "6 Clicks" Daily Activity     Outcome Measure Help from another person eating meals?: None Help from another person taking care of personal grooming?: None Help from another person toileting, which includes using toliet, bedpan, or urinal?: A Little Help from another person bathing (including washing, rinsing, drying)?: None Help from another person to put on and taking off regular upper body clothing?: None Help from another person to put on and taking off regular lower body clothing?: A Little 6 Click Score: 22   End of Session Equipment Utilized During Treatment: Rolling walker (2 wheels);Oxygen   Activity Tolerance: Patient tolerated treatment well Patient left: in bed;with call bell/phone within reach;with bed alarm set  OT Visit Diagnosis: Unsteadiness on feet (R26.81);Other abnormalities of gait and mobility (R26.89);Muscle weakness (generalized) (M62.81)                Time: 1610-9604 OT Time Calculation (min): 12 min Charges:  OT General Charges $OT Visit: 1 Visit OT Evaluation $OT Eval Low Complexity: 1 Low  Gordan Latina, M.S. OTR/L  12/18/23, 4:17 PM  ascom [phone]

## 2023-12-18 NOTE — Progress Notes (Signed)
 [facility] Liaison Note  12/18/2023  Cindia Fregoso [facility] Dec 22, 1931 [id number]  Location: RN Hospital Liaison screened the patient remotely at [facility].  Insurance: [employer]   Molly Hodges is a 89 y.o. female who is a Primary Care Patient of Fisher, Erlinda Haws, MD [facility]. The patient was screened for 30 day readmission hospitalization with noted high risk score for unplanned readmission risk with  2 IP in 6 months.  The patient was assessed for potential Care Management service needs for post hospital transition for care coordination. Review of patient's electronic medical record reveals patient was admitted for COPD. Pt is active with the HF clinic pending her initial appointment. Liaison spoke with the pt's daughter [name] [phone] and educated on VBCI services for post hospital liaison nurse care coordination follow up calls (receptive). Daughter states pt lives with her spouse and was independent with her ADLs however recently in need of assistance and daughter hired an caregiver for M-F from 9-12 pm to assist where needed. Liaison will make a referral for VBCI care management services to intervene upon pt's discharge.   Plan: [facility] Liaison will continue to follow progress and disposition to asess for post hospital community care coordination/management needs.  Referral request for community care coordination: pending disposition.   VBCI Care Management/Population Health does not replace or interfere with any arrangements made by the Inpatient Transition of Care team.   For questions contact:   Lilla Reichert, RN, BSN Hospital Liaison [place]   [facility], Population Health Office Hours MTWF  8:00 am-6:00 pm Direct Dial: [phone] mobile [email or web address]

## 2023-12-18 NOTE — Progress Notes (Signed)
 PROGRESS NOTE    Molly Hodges  ZOX:[id number] DOB: Jan 14, 1932 DOA: 12/15/2023 PCP: Lamon Pillow, MD   Assessment & Plan:   Principal Problem:   COPD with acute exacerbation [facility]) Active Problems:   Acute on chronic hypoxic respiratory failure (HCC)   COPD with acute bronchitis (HCC)   COPD exacerbation (HCC)  Assessment and Plan: COPD exacerbation: increase IV steroids & continue on bronchodilators & encourage incentive spirometry. CTA chest was neg for PE & pneumonia. Rhino/enterovirus was positive on 12/14/23   Possible asthma exacerbation: unknown severity and/or stage. Increased steroids today. Continue on bronchodilators & encourage incentive spirometry   Chronic hypoxic respiratory failure: continue on supplemental oxygen , uses 2L [place] at home chronically    Chronic diastolic CHF: w/  mod-severe mitral stenosis. Continue on home dose of torsemide . Monitor I/Os    PAF: continue on home dose of diltiazem , warfarin   Hx of CAD: continue on zetia , plavix , statin     Hx of PAD: continue on plavix , statin    HTN: continue on home dose of diltiazem         DVT prophylaxis: warfarin  Code Status: DNR Family Communication: discussed pt's care w/ pt's daughter, [name], and answered her questions  Disposition Plan: likely d/c back home   Level of care: Progressive  Status is: Inpatient Remains inpatient appropriate because: severity of illness    Consultants:    Procedures:   Antimicrobials:   Subjective: Pt c/o malaise   Objective: Vitals:   12/18/23 0152 12/18/23 0537 12/18/23 0720 12/18/23 0814  BP:  (!) 142/60  (!) 110/53  Pulse:    87  Resp:  20  18  Temp:  97.8 F (36.6 C)  98.2 F (36.8 C)  TempSrc:  Oral    SpO2: 98% 98% 99% 100%  Weight:      Height:        Intake/Output Summary (Last 24 hours) at 12/18/2023 0837 Last data filed at 12/18/2023 0538 Gross per 24 hour  Intake 120 ml  Output 525 ml  Net -405 ml   Filed Weights   12/15/23  1612 12/16/23 2200  Weight: 54.3 kg 59.2 kg    Examination:  General exam: appears calm & comfortable  Respiratory system: course breath sounds b/l. Wheezes b/l  Cardiovascular system: S1 & S2+. No rubs or gallops  Gastrointestinal system: abd is soft, NT, ND, & hypoactive bowel sounds  Central nervous system: alert & oriented. Moves all extremities  Psychiatry: Judgement and insight appears at baseline. Flat mood and affect      Data Reviewed: I have personally reviewed following labs and imaging studies  CBC: Recent Labs  Lab 12/14/23 0531 12/15/23 1620 12/16/23 0625 12/17/23 0415 12/18/23 0511  WBC 12.9* 17.6* 12.3* 16.7* 19.0*  HGB 11.5* 11.9* 11.6* 11.4* 11.1*  HCT 35.7* 37.7 36.6 35.9* 35.4*  MCV 70.4* 71.8* 71.2* 70.8* 70.8*  PLT 165 223 209 231 223   Basic Metabolic Panel: Recent Labs  Lab 12/13/23 1633 12/14/23 0531 12/15/23 1620 12/17/23 0415 12/18/23 0511  NA 137 136 136 137 131*  K 3.8 3.3* 4.2 4.4 4.3  CL 104 102 99 103 98  CO2 23 23 24 26 24   GLUCOSE 118* 270* 123* 220* 252*  BUN 21 23 39* 33* 36*  CREATININE 0.90 0.84 0.94 0.90 0.92  CALCIUM  8.3* 8.4* 8.6* 8.5* 8.4*   GFR: Estimated Creatinine Clearance: 36.6 mL/min (by C-G formula based on SCr of 0.92 mg/dL). Liver Function Tests: No results  for input(s): "AST", "ALT", "ALKPHOS", "BILITOT", "PROT", "ALBUMIN" in the last 168 hours. No results for input(s): "LIPASE", "AMYLASE" in the last 168 hours. No results for input(s): "AMMONIA" in the last 168 hours. Coagulation Profile: Recent Labs  Lab 12/14/23 0531 12/15/23 1620 12/16/23 0625 12/17/23 0415 12/18/23 0511  INR 2.3* 2.8* 2.6* 2.6* 3.0*   Cardiac Enzymes: No results for input(s): "CKTOTAL", "CKMB", "CKMBINDEX", "TROPONINI" in the last 168 hours. BNP (last 3 results) No results for input(s): "PROBNP" in the last 8760 hours. HbA1C: No results for input(s): "HGBA1C" in the last 72 hours. CBG: No results for input(s): "GLUCAP" in  the last 168 hours. Lipid Profile: No results for input(s): "CHOL", "HDL", "LDLCALC", "TRIG", "CHOLHDL", "LDLDIRECT" in the last 72 hours. Thyroid  Function Tests: No results for input(s): "TSH", "T4TOTAL", "FREET4", "T3FREE", "THYROIDAB" in the last 72 hours. Anemia Panel: No results for input(s): "VITAMINB12", "FOLATE", "FERRITIN", "TIBC", "IRON", "RETICCTPCT" in the last 72 hours. Sepsis Labs: Recent Labs  Lab 12/13/23 1633 12/13/23 1721 12/15/23 1621  PROCALCITON <0.10  --   --   LATICACIDVEN  --  1.9 1.5    Recent Results (from the past 240 hours)  Resp panel by RT-PCR (RSV, Flu A&B, Covid) Anterior Nasal Swab     Status: None   Collection Time: 12/13/23  5:21 PM   Specimen: Anterior Nasal Swab  Result Value Ref Range Status   SARS Coronavirus 2 by RT PCR NEGATIVE NEGATIVE Final    Comment: (NOTE) SARS-CoV-2 target nucleic acids are NOT DETECTED.  The SARS-CoV-2 RNA is generally detectable in upper respiratory specimens during the acute phase of infection. The lowest concentration of SARS-CoV-2 viral copies this assay can detect is 138 copies/mL. A negative result does not preclude SARS-Cov-2 infection and should not be used as the sole basis for treatment or other patient management decisions. A negative result may occur with  improper specimen collection/handling, submission of specimen other than nasopharyngeal swab, presence of viral mutation(s) within the areas targeted by this assay, and inadequate number of viral copies(<138 copies/mL). A negative result must be combined with clinical observations, patient history, and epidemiological information. The expected result is Negative.  Fact Sheet for Patients:  BloggerCourse.com  Fact Sheet for Healthcare Providers:  SeriousBroker.it  This test is no t yet approved or cleared by the [place]  FDA and  has been authorized for detection and/or diagnosis of  SARS-CoV-2 by FDA under an Emergency Use Authorization (EUA). This EUA will remain  in effect (meaning this test can be used) for the duration of the COVID-19 declaration under Section 564(b)(1) of the Act, 21 U.S.C.section 360bbb-3(b)(1), unless the authorization is terminated  or revoked sooner.       Influenza A by PCR NEGATIVE NEGATIVE Final   Influenza B by PCR NEGATIVE NEGATIVE Final    Comment: (NOTE) The Xpert Xpress SARS-CoV-2/FLU/RSV plus assay is intended as an aid in the diagnosis of influenza from Nasopharyngeal swab specimens and should not be used as a sole basis for treatment. Nasal washings and aspirates are unacceptable for Xpert Xpress SARS-CoV-2/FLU/RSV testing.  Fact Sheet for Patients: BloggerCourse.com  Fact Sheet for Healthcare Providers: SeriousBroker.it  This test is not yet approved or cleared by the [place]  FDA and has been authorized for detection and/or diagnosis of SARS-CoV-2 by FDA under an Emergency Use Authorization (EUA). This EUA will remain in effect (meaning this test can be used) for the duration of the COVID-19 declaration under Section 564(b)(1) of the Act, 21 U.S.C. section  360bbb-3(b)(1), unless the authorization is terminated or revoked.     Resp Syncytial Virus by PCR NEGATIVE NEGATIVE Final    Comment: (NOTE) Fact Sheet for Patients: BloggerCourse.com  Fact Sheet for Healthcare Providers: SeriousBroker.it  This test is not yet approved or cleared by the [place]  FDA and has been authorized for detection and/or diagnosis of SARS-CoV-2 by FDA under an Emergency Use Authorization (EUA). This EUA will remain in effect (meaning this test can be used) for the duration of the COVID-19 declaration under Section 564(b)(1) of the Act, 21 U.S.C. section 360bbb-3(b)(1), unless the authorization is terminated  or revoked.  Performed at [facility], [address] [place], [place] [address]   Blood culture (routine x 2)     Status: None   Collection Time: 12/13/23  5:21 PM   Specimen: BLOOD  Result Value Ref Range Status   Specimen Description BLOOD RIGHT ANTECUBITAL  Final   Special Requests   Final    BOTTLES DRAWN AEROBIC AND ANAEROBIC Blood Culture results may not be optimal due to an inadequate volume of blood received in culture bottles   Culture   Final    NO GROWTH 5 DAYS Performed at [facility], [address] [place], [place] [address]    Report Status 12/18/2023 FINAL  Final  Blood culture (routine x 2)     Status: None   Collection Time: 12/13/23  8:43 PM   Specimen: BLOOD  Result Value Ref Range Status   Specimen Description BLOOD BLOOD LEFT ARM  Final   Special Requests   Final    BOTTLES DRAWN AEROBIC AND ANAEROBIC Blood Culture adequate volume   Culture   Final    NO GROWTH 5 DAYS Performed at [facility], [address] [place], [place] [address]    Report Status 12/18/2023 FINAL  Final  Respiratory (~20 pathogens) panel by PCR     Status: Abnormal   Collection Time: 12/14/23 10:55 AM   Specimen: Nasopharyngeal Swab; Respiratory  Result Value Ref Range Status   Adenovirus NOT DETECTED NOT DETECTED Final   Coronavirus 229E NOT DETECTED NOT DETECTED Final    Comment: (NOTE) The Coronavirus on the Respiratory Panel, DOES NOT test for the novel  Coronavirus (2019 nCoV)    Coronavirus HKU1 NOT DETECTED NOT DETECTED Final   Coronavirus NL63 NOT DETECTED NOT DETECTED Final   Coronavirus OC43 NOT DETECTED NOT DETECTED Final   Metapneumovirus NOT DETECTED NOT DETECTED Final   Rhinovirus / Enterovirus DETECTED (A) NOT DETECTED Final   Influenza A NOT DETECTED NOT DETECTED Final   Influenza B NOT DETECTED NOT DETECTED Final   Parainfluenza Virus 1 NOT DETECTED NOT DETECTED Final   Parainfluenza Virus 2 NOT DETECTED NOT DETECTED Final    Parainfluenza Virus 3 NOT DETECTED NOT DETECTED Final   Parainfluenza Virus 4 NOT DETECTED NOT DETECTED Final   Respiratory Syncytial Virus NOT DETECTED NOT DETECTED Final   Bordetella pertussis NOT DETECTED NOT DETECTED Final   Bordetella Parapertussis NOT DETECTED NOT DETECTED Final   Chlamydophila pneumoniae NOT DETECTED NOT DETECTED Final   Mycoplasma pneumoniae NOT DETECTED NOT DETECTED Final    Comment: Performed at [facility], [address], [place], [place] [address]         Radiology Studies: No results found.       Scheduled Meds:  clopidogrel   75 mg Oral Daily   diltiazem   120 mg Oral Daily   ezetimibe   10 mg Oral  Daily   guaiFENesin   600 mg Oral BID   ipratropium-albuterol   3 mL Nebulization Q6H   magnesium  oxide  400 mg Oral Daily   methylPREDNISolone  (SOLU-MEDROL ) injection  40 mg Intravenous Q12H   potassium chloride   20 mEq Oral Daily   pregabalin   50 mg Oral BID   rosuvastatin   20 mg Oral Daily   torsemide   20 mg Oral Daily   warfarin  3 mg Oral q1600   Warfarin - Pharmacist Dosing Inpatient   Does not apply q1600   Continuous Infusions:   LOS: 2 days      Alphonsus Jeans, MD [facility] Pager [phone]  If 7PM-7AM, please contact night-coverage [email or web address] 12/18/2023, 8:37 AM

## 2023-12-19 DIAGNOSIS — J441 Chronic obstructive pulmonary disease with (acute) exacerbation: Secondary | ICD-10-CM | POA: Diagnosis not present

## 2023-12-19 LAB — CBC
HCT: 35.8 % — ABNORMAL LOW (ref 36.0–46.0)
Hemoglobin: 11.5 g/dL — ABNORMAL LOW (ref 12.0–15.0)
MCH: 22.6 pg — ABNORMAL LOW (ref 26.0–34.0)
MCHC: 32.1 g/dL (ref 30.0–36.0)
MCV: 70.5 fL — ABNORMAL LOW (ref 80.0–100.0)
Platelets: 235 10*3/uL (ref 150–400)
RBC: 5.08 MIL/uL (ref 3.87–5.11)
RDW: 17.5 % — ABNORMAL HIGH (ref 11.5–15.5)
WBC: 18.5 10*3/uL — ABNORMAL HIGH (ref 4.0–10.5)
nRBC: 0 % (ref 0.0–0.2)

## 2023-12-19 LAB — BASIC METABOLIC PANEL WITH GFR
Anion gap: 11 (ref 5–15)
BUN: 39 mg/dL — ABNORMAL HIGH (ref 8–23)
CO2: 26 mmol/L (ref 22–32)
Calcium: 8.4 mg/dL — ABNORMAL LOW (ref 8.9–10.3)
Chloride: 98 mmol/L (ref 98–111)
Creatinine, Ser: 0.83 mg/dL (ref 0.44–1.00)
GFR, Estimated: >60 mL/min (ref >60)
Glucose, Bld: 263 mg/dL — ABNORMAL HIGH (ref 70–99)
Potassium: 4.5 mmol/L (ref 3.5–5.1)
Sodium: 135 mmol/L (ref 135–145)

## 2023-12-19 LAB — PROTIME-INR
INR: 3.4 — ABNORMAL HIGH (ref 0.8–1.2)
Prothrombin Time: 34.3 s — ABNORMAL HIGH (ref 11.4–15.2)

## 2023-12-19 NOTE — Care Management Important Message (Signed)
 Important Message  Patient Details  Name: Molly Hodges MRN: [id number] Date of Birth: [birth year]   Important Message Given:  Yes - Medicare IM     Anise Kerns 12/19/2023, 12:58 PM

## 2023-12-19 NOTE — Consult Note (Signed)
 PHARMACY - ANTICOAGULATION CONSULT NOTE  Pharmacy Consult for warfarin Indication: atrial fibrillation  Allergies  Allergen Reactions   Shellfish Allergy Anaphylaxis    Other reaction(s): Hallucination   Azithromycin  Other (See Comments)    Extreme burning sensation at IV site    Tamiflu [Oseltamivir Phosphate] Other (See Comments)    Reaction:  Hallucinations     Albuterol  Palpitations    Heart racing.    Albuterol  And Levalbuterol  Palpitations    Patient Measurements: Height: 5\' 5"  (165.1 cm) Weight: 59.2 kg (130 lb 8.2 oz) IBW/kg (Calculated) : 57 HEPARIN  DW (KG): 59.2  Vital Signs: Temp: 98.5 F (36.9 C) (05/03 0738) Temp Source: Oral (05/03 0738) BP: 117/51 (05/03 0738) Pulse Rate: 67 (05/03 0738)  Labs: Recent Labs    12/17/23 0415 12/18/23 0511 12/19/23 0528  HGB 11.4* 11.1* 11.5*  HCT 35.9* 35.4* 35.8*  PLT 231 223 235  LABPROT 28.3* 31.4* 34.3*  INR 2.6* 3.0* 3.4*  CREATININE 0.90 0.92 0.83    Estimated Creatinine Clearance: 40.5 mL/min (by C-G formula based on SCr of 0.83 mg/dL).   Medical History: Past Medical History:  Diagnosis Date   Aortic atherosclerosis (HCC)    a. 05/2021 TEE: GrIII atheroma plaque involving the asc, transverse, and desc Ao.   Asthma    CAD (coronary artery disease)    a. 04/2021 Cath: LM nl, LAD 85p/m, D1 80, RI nl, LCX nl, RCA nl; b. 07/2021 PCI: pLAD (2.75x26 Onyx Frontier DES), D1 (2.5x22 Onyx Frontier DES).   Carotid artery disease (HCC)    a. s/p R carotid stenting (9mm x 7mm x 4cm long Exact stent); b. 06/2021 U/S: RICA 40-59%, LICA 40-59%.   CHF (congestive heart failure) (HCC)    Community acquired pneumonia    Essential hypertension    Glaucoma    Hyperlipidemia    Mitral regurgitation    a. TTE 08/2015: EF 60-65%, normal wall motion, mild MR, mildly dilated left atrium measuring 40 mm, RVSF normal, PASP normal; b. 05/2021 TEE: Moderate MR.   Mitral stenosis    a. 05/2021 L/RHC: Sev MS w/ mean grad  13-80mmHg and MV area of 0.5-06.cm^2; b. 05/2021 TEE: EF 55-60%, no rwma, nl RV fxn, mod MR, mod MS (MV area by P1/2t: 1.61 cm^2 w/ mean grad of ).   Peripheral neuropathy    Persistent atrial fibrillation (HCC) 09/17/2015   a. s/p DCCV 11/15/2015; b. CHADS2VASc => 4 (HTN, age x 2, female)--> warfarin; c. 06/2021 recurrent afib-->amio added.   Squamous cell carcinoma of skin 12/18/2021   R dorsum hand, EDC   Squamous cell carcinoma of skin 12/31/2021   R hand dorsum, recurrent - excised 02/04/2022   Squamous cell carcinoma of skin 12/31/2021   L forearm - ED&C   Squamous cell carcinoma of skin 06/24/2022   R thumb webspace with wart virus - ED&C   Squamous cell carcinoma of skin 10/20/2022   L lat knee - tx with ED&C    Medications:  Home Dose: Warfarin 2.5 mg PO MWF, all other days 3.75 mg (has 2.5 mg tabs at home)   Assessment: 89 yo female with PMH including asthma/COPD complicated by chronic hypoxic respiratory failure on 2 L with exertion, severe mitral stenosis, HFpEF, atrial fibrillation on warfarin, hypertension, hyperlipidemia, hypothyroidism, CAD s/p PCI to proximal LAD (2022), carotid artery disease s/p right carotid stenting. Most recent admission 12/13/2023 with acute asthma and HF exacerbation. Pharmacy consulted to manage warfarin while inpatient. -also on Plavix   Date INR Warfarin  dose Comments  4/28 2.3 3mg  Discharge home  4/29 2.8  Re-admission  4/30 2.6 3mg    5/1 2.6 3mg    5/2 3.0 2.5 mg   5/3 3.4 HOLD Hgb/Plt stable             Goal of Therapy:  INR 2-3, with mitral valve stenosis, will try to aim for a goal of 2.5 to 3.    Plan:  INR supratherapeutic -will hold Warfarin today.   DDI: on steroids, torsemide  Continue to check INR with AM labs  Thomasine Flick PharmD Clinical Pharmacist 12/19/2023

## 2023-12-19 NOTE — Evaluation (Signed)
 Physical Therapy Evaluation Patient Details Name: Molly Hodges MRN: [id number] DOB: [birth year] Today's Date: 12/19/2023  History of Present Illness  Pt is a 89 y.o. female with medical history significant for asthma/COPD complicated by chronic hypoxic respiratory failure on 2 L with exertion, severe mitral stenosis, HFpEF, atrial fibrillation on warfarin, hypertension, hyperlipidemia, hypothyroidism, CAD s/p PCI to proximal LAD (2022), carotid artery disease s/p right carotid stenting hospitalized from 4/27 to 12/14/23 with acute on chronic respiratory failure secondary to asthma and CHF exacerbation, who returns to the ED with shortness of breath.  MD assessment includes: COPD exacerbation and possible asthma exacerbation.   Clinical Impression  Pt was pleasant and motivated to participate during the session and put forth good effort throughout. Pt required no physical assistance during the session but did require extra time and effort with bed mobility tasks and transfers and was only able to amb a maximum distance of 40 feet before fatiguing and needing to return to sitting.  Pt was generally steady during amb with a RW with min lean on the AD for support but no overt LOB noted.  Pt found on 2LO2/min with SpO2 and HR WNL throughout and with no adverse symptoms noted other than mild SOB with exertion.  Pt will benefit from continued PT services upon discharge to safely address deficits listed in patient problem list for decreased caregiver assistance and eventual return to PLOF.          If plan is discharge home, recommend the following: A little help with walking and/or transfers;A little help with bathing/dressing/bathroom;Assistance with cooking/housework;Assist for transportation   Can travel by private vehicle        Equipment Recommendations None recommended by PT  Recommendations for Other Services       Functional Status Assessment Patient has had a recent decline in their  functional status and demonstrates the ability to make significant improvements in function in a reasonable and predictable amount of time.     Precautions / Restrictions Precautions Precautions: Fall Recall of Precautions/Restrictions: Intact Restrictions Weight Bearing Restrictions Per Provider Order: No      Mobility  Bed Mobility Overal bed mobility: Modified Independent             General bed mobility comments: Min extra time and effort only    Transfers Overall transfer level: Needs assistance Equipment used: Rolling walker (2 wheels)   Sit to Stand: Supervision           General transfer comment: Min extra effort to come to standing but steady without LOB upon initial stand    Ambulation/Gait Ambulation/Gait assistance: Supervision Gait Distance (Feet): 40 Feet Assistive device: Rolling walker (2 wheels) Gait Pattern/deviations: Step-through pattern, Decreased step length - right, Decreased step length - left Gait velocity: decreased     General Gait Details: Slow cadence but steady with no overt LOB with the RW  Stairs            Wheelchair Mobility     Tilt Bed    Modified Rankin (Stroke Patients Only)       Balance Overall balance assessment: Needs assistance   Sitting balance-Leahy Scale: Good     Standing balance support: Bilateral upper extremity supported, During functional activity Standing balance-Leahy Scale: Fair                               Pertinent Vitals/Pain Pain Assessment Pain Assessment: No/denies pain  Home Living Family/patient expects to be discharged to:: Private residence Living Arrangements: Spouse/significant other Available Help at Discharge: Family;Personal care attendant Type of Home: House Home Access: Level entry       Home Layout: One level Home Equipment: Rollator (4 wheels);Rolling Walker (2 wheels);Wheelchair - manual Additional Comments: Friend assists with housework T  and Thu, PCA assists with bathing patient's spouse and housework M and F.    Prior Function               Mobility Comments: Ind amb household distances without an AD, no fall history ADLs Comments: Ind with ADLs     Extremity/Trunk Assessment   Upper Extremity Assessment Upper Extremity Assessment: Overall WFL for tasks assessed    Lower Extremity Assessment Lower Extremity Assessment: Generalized weakness       Communication   Communication Communication: No apparent difficulties    Cognition Arousal: Alert Behavior During Therapy: WFL for tasks assessed/performed   PT - Cognitive impairments: No apparent impairments                         Following commands: Intact       Cueing Cueing Techniques: Verbal cues     General Comments      Exercises     Assessment/Plan    PT Assessment Patient needs continued PT services  PT Problem List Decreased strength;Decreased activity tolerance;Decreased balance;Decreased mobility;Decreased knowledge of use of DME       PT Treatment Interventions DME instruction;Gait training;Functional mobility training;Therapeutic activities;Therapeutic exercise;Balance training;Neuromuscular re-education;Patient/family education    PT Goals (Current goals can be found in the Care Plan section)  Acute Rehab PT Goals Patient Stated Goal: To breathe better PT Goal Formulation: With patient Time For Goal Achievement: 01/01/24 Potential to Achieve Goals: Good    Frequency Min 1X/week     Co-evaluation               AM-PAC PT "6 Clicks" Mobility  Outcome Measure Help needed turning from your back to your side while in a flat bed without using bedrails?: A Little Help needed moving from lying on your back to sitting on the side of a flat bed without using bedrails?: A Little Help needed moving to and from a bed to a chair (including a wheelchair)?: A Little Help needed standing up from a chair using your arms  (e.g., wheelchair or bedside chair)?: A Little Help needed to walk in hospital room?: A Little Help needed climbing 3-5 steps with a railing? : A Little 6 Click Score: 18    End of Session Equipment Utilized During Treatment: Gait belt;Oxygen  Activity Tolerance: Patient tolerated treatment well Patient left: in chair;with call bell/phone within reach;with chair alarm set Nurse Communication: Mobility status PT Visit Diagnosis: Muscle weakness (generalized) (M62.81);Unsteadiness on feet (R26.81);Difficulty in walking, not elsewhere classified (R26.2)    Time: 1610-9604 PT Time Calculation (min) (ACUTE ONLY): 19 min   Charges:   PT Evaluation $PT Eval Moderate Complexity: 1 Mod   PT General Charges $$ ACUTE PT VISIT: 1 Visit       D. Scott Homer Pfeifer PT, DPT 12/19/23, 11:11 AM

## 2023-12-19 NOTE — Plan of Care (Signed)

## 2023-12-19 NOTE — Progress Notes (Signed)
 PROGRESS NOTE    Molly Hodges  WUJ:[id number] DOB: 1932/02/28 DOA: 12/15/2023 PCP: Lamon Pillow, MD   Assessment & Plan:   Principal Problem:   COPD with acute exacerbation [facility]) Active Problems:   Acute on chronic hypoxic respiratory failure (HCC)   COPD with acute bronchitis (HCC)   COPD exacerbation (HCC)  Assessment and Plan: COPD exacerbation: continue on IV steroids, bronchodilators & encourage incentive spirometry. CTA chest was neg for PE & pneumonia. Rhinovirus/enterovirus positive on 12/14/23  Possible asthma exacerbation: unknown severity and/or stage. Continue on IV steroids, bronchodilators & encourage incentive spirometry   Chronic hypoxic respiratory failure: continue on supplemental oxygen , uses 2L [place] at home chronically    Chronic diastolic CHF: w/  mod-severe mitral stenosis. Continue on home dose of torsemide . Monitor I/Os    PAF: continue on diltiazem , warfarin   Hx of CAD: continue on statin, plavix  & zetia      Hx of PAD: continue on plavix , statin    HTN: continue on home dose of diltiazem         DVT prophylaxis: warfarin  Code Status: DNR Family Communication: discussed pt's care w/ pt's daughter, [name], and answered her questions  Disposition Plan: likely d/c back home   Level of care: Progressive  Status is: Inpatient Remains inpatient appropriate because: severity of illness    Consultants:    Procedures:   Antimicrobials:   Subjective: Pt c/o malaise   Objective: Vitals:   12/19/23 0014 12/19/23 0430 12/19/23 0716 12/19/23 0738  BP: (!) 134/52 (!) 136/56  (!) 117/51  Pulse: 66 62  67  Resp: 16 16  16   Temp: 98 F (36.7 C) 98 F (36.7 C)  98.5 F (36.9 C)  TempSrc:  Oral  Oral  SpO2: 100% 100% 99% 98%  Weight:      Height:        Intake/Output Summary (Last 24 hours) at 12/19/2023 0833 Last data filed at 12/18/2023 1544 Gross per 24 hour  Intake 240 ml  Output --  Net 240 ml   Filed Weights   12/15/23 1612  12/16/23 2200  Weight: 54.3 kg 59.2 kg    Examination:  General exam: appears calm & comfortable  Respiratory system: decreased breath sounds b/l  Cardiovascular system: S1 & S2+. No rubs or gallops Gastrointestinal system: abd is soft, NT, ND & hypoactive bowel sounds  Central nervous system: alert & oriented. Moves all extremities  Psychiatry: Judgement and insight appears at baseline. Flat mood and affect    Data Reviewed: I have personally reviewed following labs and imaging studies  CBC: Recent Labs  Lab 12/15/23 1620 12/16/23 0625 12/17/23 0415 12/18/23 0511 12/19/23 0528  WBC 17.6* 12.3* 16.7* 19.0* 18.5*  HGB 11.9* 11.6* 11.4* 11.1* 11.5*  HCT 37.7 36.6 35.9* 35.4* 35.8*  MCV 71.8* 71.2* 70.8* 70.8* 70.5*  PLT 223 209 231 223 235   Basic Metabolic Panel: Recent Labs  Lab 12/14/23 0531 12/15/23 1620 12/17/23 0415 12/18/23 0511 12/19/23 0528  NA 136 136 137 131* 135  K 3.3* 4.2 4.4 4.3 4.5  CL 102 99 103 98 98  CO2 23 24 26 24 26   GLUCOSE 270* 123* 220* 252* 263*  BUN 23 39* 33* 36* 39*  CREATININE 0.84 0.94 0.90 0.92 0.83  CALCIUM  8.4* 8.6* 8.5* 8.4* 8.4*   GFR: Estimated Creatinine Clearance: 40.5 mL/min (by C-G formula based on SCr of 0.83 mg/dL). Liver Function Tests: No results for input(s): "AST", "ALT", "ALKPHOS", "BILITOT", "PROT", "ALBUMIN" in  the last 168 hours. No results for input(s): "LIPASE", "AMYLASE" in the last 168 hours. No results for input(s): "AMMONIA" in the last 168 hours. Coagulation Profile: Recent Labs  Lab 12/15/23 1620 12/16/23 0625 12/17/23 0415 12/18/23 0511 12/19/23 0528  INR 2.8* 2.6* 2.6* 3.0* 3.4*   Cardiac Enzymes: No results for input(s): "CKTOTAL", "CKMB", "CKMBINDEX", "TROPONINI" in the last 168 hours. BNP (last 3 results) No results for input(s): "PROBNP" in the last 8760 hours. HbA1C: No results for input(s): "HGBA1C" in the last 72 hours. CBG: No results for input(s): "GLUCAP" in the last 168  hours. Lipid Profile: No results for input(s): "CHOL", "HDL", "LDLCALC", "TRIG", "CHOLHDL", "LDLDIRECT" in the last 72 hours. Thyroid  Function Tests: No results for input(s): "TSH", "T4TOTAL", "FREET4", "T3FREE", "THYROIDAB" in the last 72 hours. Anemia Panel: No results for input(s): "VITAMINB12", "FOLATE", "FERRITIN", "TIBC", "IRON", "RETICCTPCT" in the last 72 hours. Sepsis Labs: Recent Labs  Lab 12/13/23 1633 12/13/23 1721 12/15/23 1621  PROCALCITON <0.10  --   --   LATICACIDVEN  --  1.9 1.5    Recent Results (from the past 240 hours)  Resp panel by RT-PCR (RSV, Flu A&B, Covid) Anterior Nasal Swab     Status: None   Collection Time: 12/13/23  5:21 PM   Specimen: Anterior Nasal Swab  Result Value Ref Range Status   SARS Coronavirus 2 by RT PCR NEGATIVE NEGATIVE Final    Comment: (NOTE) SARS-CoV-2 target nucleic acids are NOT DETECTED.  The SARS-CoV-2 RNA is generally detectable in upper respiratory specimens during the acute phase of infection. The lowest concentration of SARS-CoV-2 viral copies this assay can detect is 138 copies/mL. A negative result does not preclude SARS-Cov-2 infection and should not be used as the sole basis for treatment or other patient management decisions. A negative result may occur with  improper specimen collection/handling, submission of specimen other than nasopharyngeal swab, presence of viral mutation(s) within the areas targeted by this assay, and inadequate number of viral copies(<138 copies/mL). A negative result must be combined with clinical observations, patient history, and epidemiological information. The expected result is Negative.  Fact Sheet for Patients:  BloggerCourse.com  Fact Sheet for Healthcare Providers:  SeriousBroker.it  This test is no t yet approved or cleared by the [place]  FDA and  has been authorized for detection and/or diagnosis of SARS-CoV-2 by FDA  under an Emergency Use Authorization (EUA). This EUA will remain  in effect (meaning this test can be used) for the duration of the COVID-19 declaration under Section 564(b)(1) of the Act, 21 U.S.C.section 360bbb-3(b)(1), unless the authorization is terminated  or revoked sooner.       Influenza A by PCR NEGATIVE NEGATIVE Final   Influenza B by PCR NEGATIVE NEGATIVE Final    Comment: (NOTE) The Xpert Xpress SARS-CoV-2/FLU/RSV plus assay is intended as an aid in the diagnosis of influenza from Nasopharyngeal swab specimens and should not be used as a sole basis for treatment. Nasal washings and aspirates are unacceptable for Xpert Xpress SARS-CoV-2/FLU/RSV testing.  Fact Sheet for Patients: BloggerCourse.com  Fact Sheet for Healthcare Providers: SeriousBroker.it  This test is not yet approved or cleared by the [place]  FDA and has been authorized for detection and/or diagnosis of SARS-CoV-2 by FDA under an Emergency Use Authorization (EUA). This EUA will remain in effect (meaning this test can be used) for the duration of the COVID-19 declaration under Section 564(b)(1) of the Act, 21 U.S.C. section 360bbb-3(b)(1), unless the authorization is terminated or revoked.  Resp Syncytial Virus by PCR NEGATIVE NEGATIVE Final    Comment: (NOTE) Fact Sheet for Patients: BloggerCourse.com  Fact Sheet for Healthcare Providers: SeriousBroker.it  This test is not yet approved or cleared by the [place]  FDA and has been authorized for detection and/or diagnosis of SARS-CoV-2 by FDA under an Emergency Use Authorization (EUA). This EUA will remain in effect (meaning this test can be used) for the duration of the COVID-19 declaration under Section 564(b)(1) of the Act, 21 U.S.C. section 360bbb-3(b)(1), unless the authorization is terminated or revoked.  Performed at [facility], [address] [place], [place] [address]   Blood culture (routine x 2)     Status: None   Collection Time: 12/13/23  5:21 PM   Specimen: BLOOD  Result Value Ref Range Status   Specimen Description BLOOD RIGHT ANTECUBITAL  Final   Special Requests   Final    BOTTLES DRAWN AEROBIC AND ANAEROBIC Blood Culture results may not be optimal due to an inadequate volume of blood received in culture bottles   Culture   Final    NO GROWTH 5 DAYS Performed at [facility], [address] [place], [place] [address]    Report Status 12/18/2023 FINAL  Final  Blood culture (routine x 2)     Status: None   Collection Time: 12/13/23  8:43 PM   Specimen: BLOOD  Result Value Ref Range Status   Specimen Description BLOOD BLOOD LEFT ARM  Final   Special Requests   Final    BOTTLES DRAWN AEROBIC AND ANAEROBIC Blood Culture adequate volume   Culture   Final    NO GROWTH 5 DAYS Performed at [facility], [address] [place], [place] [address]    Report Status 12/18/2023 FINAL  Final  Respiratory (~20 pathogens) panel by PCR     Status: Abnormal   Collection Time: 12/14/23 10:55 AM   Specimen: Nasopharyngeal Swab; Respiratory  Result Value Ref Range Status   Adenovirus NOT DETECTED NOT DETECTED Final   Coronavirus 229E NOT DETECTED NOT DETECTED Final    Comment: (NOTE) The Coronavirus on the Respiratory Panel, DOES NOT test for the novel  Coronavirus (2019 nCoV)    Coronavirus HKU1 NOT DETECTED NOT DETECTED Final   Coronavirus NL63 NOT DETECTED NOT DETECTED Final   Coronavirus OC43 NOT DETECTED NOT DETECTED Final   Metapneumovirus NOT DETECTED NOT DETECTED Final   Rhinovirus / Enterovirus DETECTED (A) NOT DETECTED Final   Influenza A NOT DETECTED NOT DETECTED Final   Influenza B NOT DETECTED NOT DETECTED Final   Parainfluenza Virus 1 NOT DETECTED NOT DETECTED Final   Parainfluenza Virus 2 NOT DETECTED NOT DETECTED Final   Parainfluenza Virus 3 NOT DETECTED  NOT DETECTED Final   Parainfluenza Virus 4 NOT DETECTED NOT DETECTED Final   Respiratory Syncytial Virus NOT DETECTED NOT DETECTED Final   Bordetella pertussis NOT DETECTED NOT DETECTED Final   Bordetella Parapertussis NOT DETECTED NOT DETECTED Final   Chlamydophila pneumoniae NOT DETECTED NOT DETECTED Final   Mycoplasma pneumoniae NOT DETECTED NOT DETECTED Final    Comment: Performed at [facility], [address] [place], [place] [address]         Radiology Studies: No results found.       Scheduled Meds:  clopidogrel   75 mg Oral Daily   diltiazem   120 mg Oral Daily   ezetimibe   10 mg Oral Daily   guaiFENesin   600 mg Oral BID   ipratropium-albuterol   3 mL Nebulization Q6H   magnesium  oxide  400 mg Oral Daily   methylPREDNISolone  (SOLU-MEDROL ) injection  60 mg Intravenous Q12H   potassium chloride   20 mEq Oral Daily   pregabalin   50 mg Oral BID   rosuvastatin   20 mg Oral Daily   torsemide   20 mg Oral Daily   Warfarin - Pharmacist Dosing Inpatient   Does not apply q1600   Continuous Infusions:   LOS: 3 days      Alphonsus Jeans, MD [facility] Pager [phone]  If 7PM-7AM, please contact night-coverage [email or web address] 12/19/2023, 8:33 AM

## 2023-12-20 DIAGNOSIS — J441 Chronic obstructive pulmonary disease with (acute) exacerbation: Secondary | ICD-10-CM | POA: Diagnosis not present

## 2023-12-20 LAB — BASIC METABOLIC PANEL WITH GFR
Anion gap: 11 (ref 5–15)
BUN: 38 mg/dL — ABNORMAL HIGH (ref 8–23)
CO2: 28 mmol/L (ref 22–32)
Calcium: 8.3 mg/dL — ABNORMAL LOW (ref 8.9–10.3)
Chloride: 97 mmol/L — ABNORMAL LOW (ref 98–111)
Creatinine, Ser: 0.98 mg/dL (ref 0.44–1.00)
GFR, Estimated: 55 mL/min — ABNORMAL LOW (ref >60)
Glucose, Bld: 301 mg/dL — ABNORMAL HIGH (ref 70–99)
Potassium: 5 mmol/L (ref 3.5–5.1)
Sodium: 136 mmol/L (ref 135–145)

## 2023-12-20 LAB — CBC
HCT: 35.9 % — ABNORMAL LOW (ref 36.0–46.0)
Hemoglobin: 11.2 g/dL — ABNORMAL LOW (ref 12.0–15.0)
MCH: 22.1 pg — ABNORMAL LOW (ref 26.0–34.0)
MCHC: 31.2 g/dL (ref 30.0–36.0)
MCV: 70.8 fL — ABNORMAL LOW (ref 80.0–100.0)
Platelets: 236 10*3/uL (ref 150–400)
RBC: 5.07 MIL/uL (ref 3.87–5.11)
RDW: 17.4 % — ABNORMAL HIGH (ref 11.5–15.5)
WBC: 20.5 10*3/uL — ABNORMAL HIGH (ref 4.0–10.5)
nRBC: 0 % (ref 0.0–0.2)

## 2023-12-20 LAB — PROTIME-INR
INR: 4.3 (ref 0.8–1.2)
Prothrombin Time: 41.2 s — ABNORMAL HIGH (ref 11.4–15.2)

## 2023-12-20 MED ORDER — IPRATROPIUM-ALBUTEROL 0.5-2.5 (3) MG/3ML IN SOLN
3.0000 mL | Freq: Two times a day (BID) | RESPIRATORY_TRACT | Status: DC
Start: 2023-12-20 — End: 2023-12-23
  Administered 2023-12-20 – 2023-12-23 (×6): 3 mL via RESPIRATORY_TRACT
  Filled 2023-12-20 (×5): qty 3

## 2023-12-20 NOTE — Consult Note (Signed)
 PHARMACY - ANTICOAGULATION CONSULT NOTE  Pharmacy Consult for warfarin Indication: atrial fibrillation  Allergies  Allergen Reactions   Shellfish Allergy Anaphylaxis    Other reaction(s): Hallucination   Azithromycin  Other (See Comments)    Extreme burning sensation at IV site    Tamiflu [Oseltamivir Phosphate] Other (See Comments)    Reaction:  Hallucinations     Albuterol  Palpitations    Heart racing.    Albuterol  And Levalbuterol  Palpitations    Patient Measurements: Height: 5\' 5"  (165.1 cm) Weight: 59.2 kg (130 lb 8.2 oz) IBW/kg (Calculated) : 57 HEPARIN  DW (KG): 59.2  Vital Signs: Temp: 98 F (36.7 C) (05/04 0728) Temp Source: Oral (05/04 0728) BP: 116/45 (05/04 0728) Pulse Rate: 70 (05/04 0728)  Labs: Recent Labs    12/18/23 0511 12/19/23 0528 12/20/23 0508  HGB 11.1* 11.5* 11.2*  HCT 35.4* 35.8* 35.9*  PLT 223 235 236  LABPROT 31.4* 34.3* 41.2*  INR 3.0* 3.4* 4.3*  CREATININE 0.92 0.83 0.98    Estimated Creatinine Clearance: 34.3 mL/min (by C-G formula based on SCr of 0.98 mg/dL).   Medical History: Past Medical History:  Diagnosis Date   Aortic atherosclerosis (HCC)    a. 05/2021 TEE: GrIII atheroma plaque involving the asc, transverse, and desc Ao.   Asthma    CAD (coronary artery disease)    a. 04/2021 Cath: LM nl, LAD 85p/m, D1 80, RI nl, LCX nl, RCA nl; b. 07/2021 PCI: pLAD (2.75x26 Onyx Frontier DES), D1 (2.5x22 Onyx Frontier DES).   Carotid artery disease (HCC)    a. s/p R carotid stenting (9mm x 7mm x 4cm long Exact stent); b. 06/2021 U/S: RICA 40-59%, LICA 40-59%.   CHF (congestive heart failure) (HCC)    Community acquired pneumonia    Essential hypertension    Glaucoma    Hyperlipidemia    Mitral regurgitation    a. TTE 08/2015: EF 60-65%, normal wall motion, mild MR, mildly dilated left atrium measuring 40 mm, RVSF normal, PASP normal; b. 05/2021 TEE: Moderate MR.   Mitral stenosis    a. 05/2021 L/RHC: Sev MS w/ mean grad 13-70mmHg  and MV area of 0.5-06.cm^2; b. 05/2021 TEE: EF 55-60%, no rwma, nl RV fxn, mod MR, mod MS (MV area by P1/2t: 1.61 cm^2 w/ mean grad of ).   Peripheral neuropathy    Persistent atrial fibrillation (HCC) 09/17/2015   a. s/p DCCV 11/15/2015; b. CHADS2VASc => 4 (HTN, age x 2, female)--> warfarin; c. 06/2021 recurrent afib-->amio added.   Squamous cell carcinoma of skin 12/18/2021   R dorsum hand, EDC   Squamous cell carcinoma of skin 12/31/2021   R hand dorsum, recurrent - excised 02/04/2022   Squamous cell carcinoma of skin 12/31/2021   L forearm - ED&C   Squamous cell carcinoma of skin 06/24/2022   R thumb webspace with wart virus - ED&C   Squamous cell carcinoma of skin 10/20/2022   L lat knee - tx with ED&C    Medications:  Home Dose: Warfarin 2.5 mg PO MWF, all other days 3.75 mg (has 2.5 mg tabs at home)   Assessment: 89 yo female with PMH including asthma/COPD complicated by chronic hypoxic respiratory failure on 2 L with exertion, severe mitral stenosis, HFpEF, atrial fibrillation on warfarin, hypertension, hyperlipidemia, hypothyroidism, CAD s/p PCI to proximal LAD (2022), carotid artery disease s/p right carotid stenting. Most recent admission 12/13/2023 with acute asthma and HF exacerbation. Pharmacy consulted to manage warfarin while inpatient. -also on Plavix   Date INR Warfarin  dose Comments  4/28 2.3 3mg  Discharge home  4/29 2.8  Re-admission  4/30 2.6 3mg    5/1 2.6 3mg    5/2 3.0 2.5 mg   5/3 3.4 HOLD Hgb/Plt stable  5/4 4.3 HOLD Hgb/Plt stable        Goal of Therapy:  INR 2-3, with mitral valve stenosis, will try to aim for a goal of 2.5 to 3.    Plan:  INR supratherapeutic -will hold Warfarin again today.   DDI: on IV steroids, torsemide  Continue to check INR with AM labs  Thomasine Flick PharmD Clinical Pharmacist 12/20/2023

## 2023-12-20 NOTE — Plan of Care (Signed)

## 2023-12-20 NOTE — Progress Notes (Signed)
 PROGRESS NOTE    Molly Hodges  WUJ:[id number] DOB: Apr 05, 1932 DOA: 12/15/2023 PCP: Lamon Pillow, MD   Assessment & Plan:   Principal Problem:   COPD with acute exacerbation [facility]) Active Problems:   Acute on chronic hypoxic respiratory failure (HCC)   COPD with acute bronchitis (HCC)   COPD exacerbation (HCC)  Assessment and Plan: COPD exacerbation: continue on IV steroids, bronchodilators & encourage incentive spirometry. CTA chest was neg for PE and pneumonia. Rhinovirus/enterovirus on 11/24/23   Possible asthma exacerbation: unknown severity and/or stage. Continue on IV steroids, bronchodilators & encourage incentive spirometry   Chronic hypoxic respiratory failure: continue on supplemental oxygen , uses 2L  at home chronically    Chronic diastolic CHF: w/  mod-severe mitral stenosis. Continue on home dose of torsemide . Monitor I/Os   PAF: continue on warfarin, diltiazem   Hx of CAD: continue on plavix , statin, zetia     Hx of PAD: continue on statin, plavix      HTN: continue on home dose of diltiazem         DVT prophylaxis: warfarin  Code Status: DNR Family Communication: discussed pt's care w/ pt's daughter, [name], and answered her questions  Disposition Plan: likely d/c back home   Level of care: Progressive  Status is: Inpatient Remains inpatient appropriate because: severity of illness    Consultants:    Procedures:   Antimicrobials:   Subjective: Pt c/o fatigue   Objective: Vitals:   12/20/23 0000 12/20/23 0528 12/20/23 0701 12/20/23 0728  BP: (!) 118/50 (!) 121/48  (!) 116/45  Pulse: 68 69  70  Resp:  18  16  Temp: (!) 97.4 F (36.3 C) 97.8 F (36.6 C)  98 F (36.7 C)  TempSrc: Oral Oral  Oral  SpO2: 99% 99% 99% 100%  Weight:      Height:        Intake/Output Summary (Last 24 hours) at 12/20/2023 0837 Last data filed at 12/20/2023 0400 Gross per 24 hour  Intake 1080 ml  Output 400 ml  Net 680 ml   Filed Weights   12/15/23  1612 12/16/23 2200  Weight: 54.3 kg 59.2 kg    Examination:  General exam: appears comfortable  Respiratory system: diminished breath sounds b/l.  Cardiovascular system: S1/S2+. No rubs or gallops  Gastrointestinal system: abd pain, soft, NT, ND & normal bowel sounds  Central nervous system: alert & oriented. Moves all extremities  Psychiatry: Judgement and insight appears at baseline. Appropriate mood and affect    Data Reviewed: I have personally reviewed following labs and imaging studies  CBC: Recent Labs  Lab 12/16/23 0625 12/17/23 0415 12/18/23 0511 12/19/23 0528 12/20/23 0508  WBC 12.3* 16.7* 19.0* 18.5* 20.5*  HGB 11.6* 11.4* 11.1* 11.5* 11.2*  HCT 36.6 35.9* 35.4* 35.8* 35.9*  MCV 71.2* 70.8* 70.8* 70.5* 70.8*  PLT 209 231 223 235 236   Basic Metabolic Panel: Recent Labs  Lab 12/15/23 1620 12/17/23 0415 12/18/23 0511 12/19/23 0528 12/20/23 0508  NA 136 137 131* 135 136  K 4.2 4.4 4.3 4.5 5.0  CL 99 103 98 98 97*  CO2 24 26 24 26 28   GLUCOSE 123* 220* 252* 263* 301*  BUN 39* 33* 36* 39* 38*  CREATININE 0.94 0.90 0.92 0.83 0.98  CALCIUM  8.6* 8.5* 8.4* 8.4* 8.3*   GFR: Estimated Creatinine Clearance: 34.3 mL/min (by C-G formula based on SCr of 0.98 mg/dL). Liver Function Tests: No results for input(s): "AST", "ALT", "ALKPHOS", "BILITOT", "PROT", "ALBUMIN" in the last 168 hours.  No results for input(s): "LIPASE", "AMYLASE" in the last 168 hours. No results for input(s): "AMMONIA" in the last 168 hours. Coagulation Profile: Recent Labs  Lab 12/16/23 0625 12/17/23 0415 12/18/23 0511 12/19/23 0528 12/20/23 0508  INR 2.6* 2.6* 3.0* 3.4* 4.3*   Cardiac Enzymes: No results for input(s): "CKTOTAL", "CKMB", "CKMBINDEX", "TROPONINI" in the last 168 hours. BNP (last 3 results) No results for input(s): "PROBNP" in the last 8760 hours. HbA1C: No results for input(s): "HGBA1C" in the last 72 hours. CBG: No results for input(s): "GLUCAP" in the last 168  hours. Lipid Profile: No results for input(s): "CHOL", "HDL", "LDLCALC", "TRIG", "CHOLHDL", "LDLDIRECT" in the last 72 hours. Thyroid  Function Tests: No results for input(s): "TSH", "T4TOTAL", "FREET4", "T3FREE", "THYROIDAB" in the last 72 hours. Anemia Panel: No results for input(s): "VITAMINB12", "FOLATE", "FERRITIN", "TIBC", "IRON", "RETICCTPCT" in the last 72 hours. Sepsis Labs: Recent Labs  Lab 12/13/23 1633 12/13/23 1721 12/15/23 1621  PROCALCITON <0.10  --   --   LATICACIDVEN  --  1.9 1.5    Recent Results (from the past 240 hours)  Resp panel by RT-PCR (RSV, Flu A&B, Covid) Anterior Nasal Swab     Status: None   Collection Time: 12/13/23  5:21 PM   Specimen: Anterior Nasal Swab  Result Value Ref Range Status   SARS Coronavirus 2 by RT PCR NEGATIVE NEGATIVE Final    Comment: (NOTE) SARS-CoV-2 target nucleic acids are NOT DETECTED.  The SARS-CoV-2 RNA is generally detectable in upper respiratory specimens during the acute phase of infection. The lowest concentration of SARS-CoV-2 viral copies this assay can detect is 138 copies/mL. A negative result does not preclude SARS-Cov-2 infection and should not be used as the sole basis for treatment or other patient management decisions. A negative result may occur with  improper specimen collection/handling, submission of specimen other than nasopharyngeal swab, presence of viral mutation(s) within the areas targeted by this assay, and inadequate number of viral copies(<138 copies/mL). A negative result must be combined with clinical observations, patient history, and epidemiological information. The expected result is Negative.  Fact Sheet for Patients:  BloggerCourse.com  Fact Sheet for Healthcare Providers:  SeriousBroker.it  This test is no t yet approved or cleared by the [place]  FDA and  has been authorized for detection and/or diagnosis of SARS-CoV-2 by FDA  under an Emergency Use Authorization (EUA). This EUA will remain  in effect (meaning this test can be used) for the duration of the COVID-19 declaration under Section 564(b)(1) of the Act, 21 U.S.C.section 360bbb-3(b)(1), unless the authorization is terminated  or revoked sooner.       Influenza A by PCR NEGATIVE NEGATIVE Final   Influenza B by PCR NEGATIVE NEGATIVE Final    Comment: (NOTE) The Xpert Xpress SARS-CoV-2/FLU/RSV plus assay is intended as an aid in the diagnosis of influenza from Nasopharyngeal swab specimens and should not be used as a sole basis for treatment. Nasal washings and aspirates are unacceptable for Xpert Xpress SARS-CoV-2/FLU/RSV testing.  Fact Sheet for Patients: BloggerCourse.com  Fact Sheet for Healthcare Providers: SeriousBroker.it  This test is not yet approved or cleared by the [place]  FDA and has been authorized for detection and/or diagnosis of SARS-CoV-2 by FDA under an Emergency Use Authorization (EUA). This EUA will remain in effect (meaning this test can be used) for the duration of the COVID-19 declaration under Section 564(b)(1) of the Act, 21 U.S.C. section 360bbb-3(b)(1), unless the authorization is terminated or revoked.     Resp  Syncytial Virus by PCR NEGATIVE NEGATIVE Final    Comment: (NOTE) Fact Sheet for Patients: BloggerCourse.com  Fact Sheet for Healthcare Providers: SeriousBroker.it  This test is not yet approved or cleared by the [place]  FDA and has been authorized for detection and/or diagnosis of SARS-CoV-2 by FDA under an Emergency Use Authorization (EUA). This EUA will remain in effect (meaning this test can be used) for the duration of the COVID-19 declaration under Section 564(b)(1) of the Act, 21 U.S.C. section 360bbb-3(b)(1), unless the authorization is terminated or revoked.  Performed at [facility], [address] [place], [place] [address]   Blood culture (routine x 2)     Status: None   Collection Time: 12/13/23  5:21 PM   Specimen: BLOOD  Result Value Ref Range Status   Specimen Description BLOOD RIGHT ANTECUBITAL  Final   Special Requests   Final    BOTTLES DRAWN AEROBIC AND ANAEROBIC Blood Culture results may not be optimal due to an inadequate volume of blood received in culture bottles   Culture   Final    NO GROWTH 5 DAYS Performed at [facility], [address] [place], [place] [address]    Report Status 12/18/2023 FINAL  Final  Blood culture (routine x 2)     Status: None   Collection Time: 12/13/23  8:43 PM   Specimen: BLOOD  Result Value Ref Range Status   Specimen Description BLOOD BLOOD LEFT ARM  Final   Special Requests   Final    BOTTLES DRAWN AEROBIC AND ANAEROBIC Blood Culture adequate volume   Culture   Final    NO GROWTH 5 DAYS Performed at [facility], [address] [place], [place] [address]    Report Status 12/18/2023 FINAL  Final  Respiratory (~20 pathogens) panel by PCR     Status: Abnormal   Collection Time: 12/14/23 10:55 AM   Specimen: Nasopharyngeal Swab; Respiratory  Result Value Ref Range Status   Adenovirus NOT DETECTED NOT DETECTED Final   Coronavirus 229E NOT DETECTED NOT DETECTED Final    Comment: (NOTE) The Coronavirus on the Respiratory Panel, DOES NOT test for the novel  Coronavirus (2019 nCoV)    Coronavirus HKU1 NOT DETECTED NOT DETECTED Final   Coronavirus NL63 NOT DETECTED NOT DETECTED Final   Coronavirus OC43 NOT DETECTED NOT DETECTED Final   Metapneumovirus NOT DETECTED NOT DETECTED Final   Rhinovirus / Enterovirus DETECTED (A) NOT DETECTED Final   Influenza A NOT DETECTED NOT DETECTED Final   Influenza B NOT DETECTED NOT DETECTED Final   Parainfluenza Virus 1 NOT DETECTED NOT DETECTED Final   Parainfluenza Virus 2 NOT DETECTED NOT DETECTED Final   Parainfluenza Virus 3 NOT DETECTED  NOT DETECTED Final   Parainfluenza Virus 4 NOT DETECTED NOT DETECTED Final   Respiratory Syncytial Virus NOT DETECTED NOT DETECTED Final   Bordetella pertussis NOT DETECTED NOT DETECTED Final   Bordetella Parapertussis NOT DETECTED NOT DETECTED Final   Chlamydophila pneumoniae NOT DETECTED NOT DETECTED Final   Mycoplasma pneumoniae NOT DETECTED NOT DETECTED Final    Comment: Performed at [facility], [address] [place], [place] [address]         Radiology Studies: No results found.       Scheduled Meds:  clopidogrel   75 mg Oral Daily   diltiazem   120 mg Oral Daily   ezetimibe   10 mg Oral Daily   guaiFENesin   600 mg Oral BID   ipratropium-albuterol   3 mL Nebulization Q6H   magnesium  oxide  400 mg Oral Daily   methylPREDNISolone  (SOLU-MEDROL ) injection  60 mg Intravenous Q12H   potassium chloride   20 mEq Oral Daily   pregabalin   50 mg Oral BID   rosuvastatin   20 mg Oral Daily   torsemide   20 mg Oral Daily   Warfarin - Pharmacist Dosing Inpatient   Does not apply q1600   Continuous Infusions:   LOS: 4 days      Alphonsus Jeans, MD [facility] Pager [phone]  If 7PM-7AM, please contact night-coverage [email or web address] 12/20/2023, 8:37 AM

## 2023-12-21 ENCOUNTER — Encounter: Admitting: Family

## 2023-12-21 DIAGNOSIS — J441 Chronic obstructive pulmonary disease with (acute) exacerbation: Secondary | ICD-10-CM | POA: Diagnosis not present

## 2023-12-21 LAB — PROTIME-INR
INR: 3.4 — ABNORMAL HIGH (ref 0.8–1.2)
Prothrombin Time: 34.4 s — ABNORMAL HIGH (ref 11.4–15.2)

## 2023-12-21 LAB — CBC
HCT: 36.2 % (ref 36.0–46.0)
Hemoglobin: 11.3 g/dL — ABNORMAL LOW (ref 12.0–15.0)
MCH: 22.1 pg — ABNORMAL LOW (ref 26.0–34.0)
MCHC: 31.2 g/dL (ref 30.0–36.0)
MCV: 70.8 fL — ABNORMAL LOW (ref 80.0–100.0)
Platelets: 260 10*3/uL (ref 150–400)
RBC: 5.11 MIL/uL (ref 3.87–5.11)
RDW: 17.2 % — ABNORMAL HIGH (ref 11.5–15.5)
WBC: 22.3 10*3/uL — ABNORMAL HIGH (ref 4.0–10.5)
nRBC: 0 % (ref 0.0–0.2)

## 2023-12-21 LAB — BASIC METABOLIC PANEL WITH GFR
Anion gap: 12 (ref 5–15)
BUN: 40 mg/dL — ABNORMAL HIGH (ref 8–23)
CO2: 27 mmol/L (ref 22–32)
Calcium: 8.1 mg/dL — ABNORMAL LOW (ref 8.9–10.3)
Chloride: 95 mmol/L — ABNORMAL LOW (ref 98–111)
Creatinine, Ser: 0.89 mg/dL (ref 0.44–1.00)
GFR, Estimated: >60 mL/min (ref >60)
Glucose, Bld: 274 mg/dL — ABNORMAL HIGH (ref 70–99)
Potassium: 4.8 mmol/L (ref 3.5–5.1)
Sodium: 134 mmol/L — ABNORMAL LOW (ref 135–145)

## 2023-12-21 LAB — GLUCOSE, CAPILLARY
Glucose-Capillary: 385 mg/dL — ABNORMAL HIGH (ref 70–99)
Glucose-Capillary: 353 mg/dL — ABNORMAL HIGH (ref 70–99)
Glucose-Capillary: 214 mg/dL — ABNORMAL HIGH (ref 70–99)

## 2023-12-21 MED ORDER — INSULIN ASPART 100 UNIT/ML IJ SOLN
0.0000 [IU] | Freq: Three times a day (TID) | INTRAMUSCULAR | Status: DC
  Administered 2023-12-21 (×2): 5 [IU] via SUBCUTANEOUS
  Administered 2023-12-22: 3 [IU] via SUBCUTANEOUS
  Administered 2023-12-22: 4 [IU] via SUBCUTANEOUS
  Administered 2023-12-22 – 2023-12-23 (×2): 2 [IU] via SUBCUTANEOUS
  Filled 2023-12-21 (×6): qty 1

## 2023-12-21 MED ORDER — METHYLPREDNISOLONE SODIUM SUCC 40 MG IJ SOLR
40.0000 mg | Freq: Two times a day (BID) | INTRAMUSCULAR | Status: DC
  Administered 2023-12-21 – 2023-12-23 (×4): 40 mg via INTRAVENOUS
  Filled 2023-12-21 (×4): qty 1

## 2023-12-21 NOTE — Consult Note (Signed)
 PHARMACY - ANTICOAGULATION CONSULT NOTE  Pharmacy Consult for warfarin Indication: atrial fibrillation  Allergies  Allergen Reactions   Shellfish Allergy Anaphylaxis    Other reaction(s): Hallucination   Azithromycin  Other (See Comments)    Extreme burning sensation at IV site    Tamiflu [Oseltamivir Phosphate] Other (See Comments)    Reaction:  Hallucinations     Albuterol  Palpitations    Heart racing.    Albuterol  And Levalbuterol  Palpitations    Patient Measurements: Height: 5\' 5"  (165.1 cm) Weight: 59.2 kg (130 lb 8.2 oz) IBW/kg (Calculated) : 57 HEPARIN  DW (KG): 59.2  Vital Signs: Temp: 97.7 F (36.5 C) (05/05 0445) Temp Source: Oral (05/05 0445) BP: 125/72 (05/05 0728) Pulse Rate: 76 (05/05 0728)  Labs: Recent Labs    12/19/23 0528 12/20/23 0508 12/21/23 0521  HGB 11.5* 11.2* 11.3*  HCT 35.8* 35.9* 36.2  PLT 235 236 260  LABPROT 34.3* 41.2* 34.4*  INR 3.4* 4.3* 3.4*  CREATININE 0.83 0.98 0.89    Estimated Creatinine Clearance: 37.8 mL/min (by C-G formula based on SCr of 0.89 mg/dL).   Medical History: Past Medical History:  Diagnosis Date   Aortic atherosclerosis (HCC)    a. 05/2021 TEE: GrIII atheroma plaque involving the asc, transverse, and desc Ao.   Asthma    CAD (coronary artery disease)    a. 04/2021 Cath: LM nl, LAD 85p/m, D1 80, RI nl, LCX nl, RCA nl; b. 07/2021 PCI: pLAD (2.75x26 Onyx Frontier DES), D1 (2.5x22 Onyx Frontier DES).   Carotid artery disease (HCC)    a. s/p R carotid stenting (9mm x 7mm x 4cm long Exact stent); b. 06/2021 U/S: RICA 40-59%, LICA 40-59%.   CHF (congestive heart failure) (HCC)    Community acquired pneumonia    Essential hypertension    Glaucoma    Hyperlipidemia    Mitral regurgitation    a. TTE 08/2015: EF 60-65%, normal wall motion, mild MR, mildly dilated left atrium measuring 40 mm, RVSF normal, PASP normal; b. 05/2021 TEE: Moderate MR.   Mitral stenosis    a. 05/2021 L/RHC: Sev MS w/ mean grad 13-69mmHg  and MV area of 0.5-06.cm^2; b. 05/2021 TEE: EF 55-60%, no rwma, nl RV fxn, mod MR, mod MS (MV area by P1/2t: 1.61 cm^2 w/ mean grad of ).   Peripheral neuropathy    Persistent atrial fibrillation (HCC) 09/17/2015   a. s/p DCCV 11/15/2015; b. CHADS2VASc => 4 (HTN, age x 2, female)--> warfarin; c. 06/2021 recurrent afib-->amio added.   Squamous cell carcinoma of skin 12/18/2021   R dorsum hand, EDC   Squamous cell carcinoma of skin 12/31/2021   R hand dorsum, recurrent - excised 02/04/2022   Squamous cell carcinoma of skin 12/31/2021   L forearm - ED&C   Squamous cell carcinoma of skin 06/24/2022   R thumb webspace with wart virus - ED&C   Squamous cell carcinoma of skin 10/20/2022   L lat knee - tx with ED&C    Medications:  Home Dose: Warfarin 2.5 mg PO MWF, all other days 3.75 mg (has 2.5 mg tabs at home)   Assessment: 89 yo female with PMH including asthma/COPD complicated by chronic hypoxic respiratory failure on 2 L with exertion, severe mitral stenosis, HFpEF, atrial fibrillation on warfarin, hypertension, hyperlipidemia, hypothyroidism, CAD s/p PCI to proximal LAD (2022), carotid artery disease s/p right carotid stenting. Most recent admission 12/13/2023 with acute asthma and HF exacerbation. Pharmacy consulted to manage warfarin while inpatient.  Date INR Warfarin dose Comments  4/28 2.3 3mg  Discharge home  4/29 2.8  Re-admission  4/30 2.6 3mg    5/1 2.6 3mg    5/2 3.0 2.5 mg   5/3 3.4 HOLD Hgb/Plt stable  5/4 4.3 HOLD Hgb/Plt stable  5/5 3.4 HOLD Hbg/Plt stable   Goal of Therapy:  INR 2-3, with mitral valve stenosis, will try to aim for a goal of 2.5 to 3.    Plan:  INR supra therapeutic - will hold warfarin again today If INR <3 tomorrow, plan on resuming lower warfarin dose DDI: on IV steroids (since 4/29), torsemide , and clopidogrel  Continue to check INR with AM labs  Ara Knee, PharmD Candidate 12/21/2023

## 2023-12-21 NOTE — Progress Notes (Signed)
 PROGRESS NOTE    Molly Hodges  JYN:[id number] DOB: [birth year] DOA: 12/15/2023 PCP: Lamon Pillow, MD   Assessment & Plan:   Principal Problem:   COPD with acute exacerbation [facility]) Active Problems:   Acute on chronic hypoxic respiratory failure (HCC)   COPD with acute bronchitis (HCC)   COPD exacerbation (HCC)  Assessment and Plan: COPD exacerbation: start to wean IV steroids 12/21/23 & continue on bronchodilators & encourage incentive spirometry. CTA chest was neg for PE and pneumonia. Rhinovirus/enterovirus on 11/24/23   Possible asthma exacerbation: unknown severity and/or stage. Start to taper IV steroids 12/21/23. Continue on bronchodilators & encourage incentive spirometry   Chronic hypoxic respiratory failure: continue on supplemental oxygen , uses 2L [place] at home chronically    Chronic diastolic CHF: w/  mod-severe mitral stenosis. Continue on home dose of torsemide . Monitor I/Os    PAF: continue on diltiazem , warfarin. Pharmacy is dosing and monitor warfarin & INR   Hx of CAD: continue on zetia , statin, plavix     Hx of PAD: continue on plavix , statin   HTN: continue on home dose of diltiazem         DVT prophylaxis: warfarin  Code Status: DNR Family Communication: discussed pt's care w/ pt's daughter, [name], and answered her questions  Disposition Plan: likely d/c back home   Level of care: Progressive  Status is: Inpatient Remains inpatient appropriate because: severity of illness, still requiring IV steroids, but starting taper today     Consultants:    Procedures:   Antimicrobials:   Subjective: Pt c/o generalized weakness  Objective: Vitals:   12/21/23 0002 12/21/23 0445 12/21/23 0722 12/21/23 0728  BP: (!) 119/48 124/64  125/72  Pulse: 72 69  76  Resp:      Temp: 98.1 F (36.7 C) 97.7 F (36.5 C)    TempSrc: Oral Oral    SpO2: 100% 100% 100% 100%  Weight:      Height:        Intake/Output Summary (Last 24 hours) at 12/21/2023 0917 Last  data filed at 12/21/2023 0400 Gross per 24 hour  Intake 720 ml  Output --  Net 720 ml   Filed Weights   12/15/23 1612 12/16/23 2200  Weight: 54.3 kg 59.2 kg    Examination:  General exam: appears calm & comfortable   Respiratory system: decreased breath sounds b/l. No wheezes  Cardiovascular system: S1 & S2+. No rubs or gallops   Gastrointestinal system: abd pain is soft, NT, ND & hypoactive bowel sounds  Central nervous system: alert & oriented. Moves all extremities  Psychiatry: Judgement and insight appears at baseline. Appropriate mood and affect     Data Reviewed: I have personally reviewed following labs and imaging studies  CBC: Recent Labs  Lab 12/17/23 0415 12/18/23 0511 12/19/23 0528 12/20/23 0508 12/21/23 0521  WBC 16.7* 19.0* 18.5* 20.5* 22.3*  HGB 11.4* 11.1* 11.5* 11.2* 11.3*  HCT 35.9* 35.4* 35.8* 35.9* 36.2  MCV 70.8* 70.8* 70.5* 70.8* 70.8*  PLT 231 223 235 236 260   Basic Metabolic Panel: Recent Labs  Lab 12/17/23 0415 12/18/23 0511 12/19/23 0528 12/20/23 0508 12/21/23 0521  NA 137 131* 135 136 134*  K 4.4 4.3 4.5 5.0 4.8  CL 103 98 98 97* 95*  CO2 26 24 26 28 27   GLUCOSE 220* 252* 263* 301* 274*  BUN 33* 36* 39* 38* 40*  CREATININE 0.90 0.92 0.83 0.98 0.89  CALCIUM  8.5* 8.4* 8.4* 8.3* 8.1*   GFR: Estimated Creatinine Clearance:  37.8 mL/min (by C-G formula based on SCr of 0.89 mg/dL). Liver Function Tests: No results for input(s): "AST", "ALT", "ALKPHOS", "BILITOT", "PROT", "ALBUMIN" in the last 168 hours. No results for input(s): "LIPASE", "AMYLASE" in the last 168 hours. No results for input(s): "AMMONIA" in the last 168 hours. Coagulation Profile: Recent Labs  Lab 12/17/23 0415 12/18/23 0511 12/19/23 0528 12/20/23 0508 12/21/23 0521  INR 2.6* 3.0* 3.4* 4.3* 3.4*   Cardiac Enzymes: No results for input(s): "CKTOTAL", "CKMB", "CKMBINDEX", "TROPONINI" in the last 168 hours. BNP (last 3 results) No results for input(s): "PROBNP"  in the last 8760 hours. HbA1C: No results for input(s): "HGBA1C" in the last 72 hours. CBG: No results for input(s): "GLUCAP" in the last 168 hours. Lipid Profile: No results for input(s): "CHOL", "HDL", "LDLCALC", "TRIG", "CHOLHDL", "LDLDIRECT" in the last 72 hours. Thyroid  Function Tests: No results for input(s): "TSH", "T4TOTAL", "FREET4", "T3FREE", "THYROIDAB" in the last 72 hours. Anemia Panel: No results for input(s): "VITAMINB12", "FOLATE", "FERRITIN", "TIBC", "IRON", "RETICCTPCT" in the last 72 hours. Sepsis Labs: Recent Labs  Lab 12/15/23 1621  LATICACIDVEN 1.5    Recent Results (from the past 240 hours)  Resp panel by RT-PCR (RSV, Flu A&B, Covid) Anterior Nasal Swab     Status: None   Collection Time: 12/13/23  5:21 PM   Specimen: Anterior Nasal Swab  Result Value Ref Range Status   SARS Coronavirus 2 by RT PCR NEGATIVE NEGATIVE Final    Comment: (NOTE) SARS-CoV-2 target nucleic acids are NOT DETECTED.  The SARS-CoV-2 RNA is generally detectable in upper respiratory specimens during the acute phase of infection. The lowest concentration of SARS-CoV-2 viral copies this assay can detect is 138 copies/mL. A negative result does not preclude SARS-Cov-2 infection and should not be used as the sole basis for treatment or other patient management decisions. A negative result may occur with  improper specimen collection/handling, submission of specimen other than nasopharyngeal swab, presence of viral mutation(s) within the areas targeted by this assay, and inadequate number of viral copies(<138 copies/mL). A negative result must be combined with clinical observations, patient history, and epidemiological information. The expected result is Negative.  Fact Sheet for Patients:  BloggerCourse.com  Fact Sheet for Healthcare Providers:  SeriousBroker.it  This test is no t yet approved or cleared by the [place]  FDA and   has been authorized for detection and/or diagnosis of SARS-CoV-2 by FDA under an Emergency Use Authorization (EUA). This EUA will remain  in effect (meaning this test can be used) for the duration of the COVID-19 declaration under Section 564(b)(1) of the Act, 21 U.S.C.section 360bbb-3(b)(1), unless the authorization is terminated  or revoked sooner.       Influenza A by PCR NEGATIVE NEGATIVE Final   Influenza B by PCR NEGATIVE NEGATIVE Final    Comment: (NOTE) The Xpert Xpress SARS-CoV-2/FLU/RSV plus assay is intended as an aid in the diagnosis of influenza from Nasopharyngeal swab specimens and should not be used as a sole basis for treatment. Nasal washings and aspirates are unacceptable for Xpert Xpress SARS-CoV-2/FLU/RSV testing.  Fact Sheet for Patients: BloggerCourse.com  Fact Sheet for Healthcare Providers: SeriousBroker.it  This test is not yet approved or cleared by the [place]  FDA and has been authorized for detection and/or diagnosis of SARS-CoV-2 by FDA under an Emergency Use Authorization (EUA). This EUA will remain in effect (meaning this test can be used) for the duration of the COVID-19 declaration under Section 564(b)(1) of the Act, 21 U.S.C. section 360bbb-3(b)(1),  unless the authorization is terminated or revoked.     Resp Syncytial Virus by PCR NEGATIVE NEGATIVE Final    Comment: (NOTE) Fact Sheet for Patients: BloggerCourse.com  Fact Sheet for Healthcare Providers: SeriousBroker.it  This test is not yet approved or cleared by the [place]  FDA and has been authorized for detection and/or diagnosis of SARS-CoV-2 by FDA under an Emergency Use Authorization (EUA). This EUA will remain in effect (meaning this test can be used) for the duration of the COVID-19 declaration under Section 564(b)(1) of the Act, 21 U.S.C. section 360bbb-3(b)(1),  unless the authorization is terminated or revoked.  Performed at [facility], [address] [place], [place] [address]   Blood culture (routine x 2)     Status: None   Collection Time: 12/13/23  5:21 PM   Specimen: BLOOD  Result Value Ref Range Status   Specimen Description BLOOD RIGHT ANTECUBITAL  Final   Special Requests   Final    BOTTLES DRAWN AEROBIC AND ANAEROBIC Blood Culture results may not be optimal due to an inadequate volume of blood received in culture bottles   Culture   Final    NO GROWTH 5 DAYS Performed at [facility], [address] [place], [place] [address]    Report Status 12/18/2023 FINAL  Final  Blood culture (routine x 2)     Status: None   Collection Time: 12/13/23  8:43 PM   Specimen: BLOOD  Result Value Ref Range Status   Specimen Description BLOOD BLOOD LEFT ARM  Final   Special Requests   Final    BOTTLES DRAWN AEROBIC AND ANAEROBIC Blood Culture adequate volume   Culture   Final    NO GROWTH 5 DAYS Performed at [facility], [address] [place], [place] [address]    Report Status 12/18/2023 FINAL  Final  Respiratory (~20 pathogens) panel by PCR     Status: Abnormal   Collection Time: 12/14/23 10:55 AM   Specimen: Nasopharyngeal Swab; Respiratory  Result Value Ref Range Status   Adenovirus NOT DETECTED NOT DETECTED Final   Coronavirus 229E NOT DETECTED NOT DETECTED Final    Comment: (NOTE) The Coronavirus on the Respiratory Panel, DOES NOT test for the novel  Coronavirus (2019 nCoV)    Coronavirus HKU1 NOT DETECTED NOT DETECTED Final   Coronavirus NL63 NOT DETECTED NOT DETECTED Final   Coronavirus OC43 NOT DETECTED NOT DETECTED Final   Metapneumovirus NOT DETECTED NOT DETECTED Final   Rhinovirus / Enterovirus DETECTED (A) NOT DETECTED Final   Influenza A NOT DETECTED NOT DETECTED Final   Influenza B NOT DETECTED NOT DETECTED Final   Parainfluenza Virus 1 NOT DETECTED NOT DETECTED Final   Parainfluenza  Virus 2 NOT DETECTED NOT DETECTED Final   Parainfluenza Virus 3 NOT DETECTED NOT DETECTED Final   Parainfluenza Virus 4 NOT DETECTED NOT DETECTED Final   Respiratory Syncytial Virus NOT DETECTED NOT DETECTED Final   Bordetella pertussis NOT DETECTED NOT DETECTED Final   Bordetella Parapertussis NOT DETECTED NOT DETECTED Final   Chlamydophila pneumoniae NOT DETECTED NOT DETECTED Final   Mycoplasma pneumoniae NOT DETECTED NOT DETECTED Final    Comment: Performed at [facility], [address], [place], [place] [address]         Radiology Studies: No results found.       Scheduled Meds:  clopidogrel   75 mg Oral Daily   diltiazem   120 mg Oral Daily   ezetimibe   10 mg Oral Daily  guaiFENesin   600 mg Oral BID   ipratropium-albuterol   3 mL Nebulization BID   magnesium  oxide  400 mg Oral Daily   methylPREDNISolone  (SOLU-MEDROL ) injection  60 mg Intravenous Q12H   potassium chloride   20 mEq Oral Daily   pregabalin   50 mg Oral BID   rosuvastatin   20 mg Oral Daily   torsemide   20 mg Oral Daily   Warfarin - Pharmacist Dosing Inpatient   Does not apply q1600   Continuous Infusions:   LOS: 5 days      Alphonsus Jeans, MD [facility] Pager [phone]  If 7PM-7AM, please contact night-coverage [email or web address] 12/21/2023, 9:17 AM

## 2023-12-21 NOTE — Plan of Care (Signed)

## 2023-12-21 NOTE — Progress Notes (Signed)
 Physical Therapy Treatment Patient Details Name: Molly Hodges MRN: [id number] DOB: [birth year] Today's Date: 12/21/2023   History of Present Illness Pt is a 89 y.o. female with medical history significant for asthma/COPD complicated by chronic hypoxic respiratory failure on 2 L with exertion, severe mitral stenosis, HFpEF, atrial fibrillation on warfarin, hypertension, hyperlipidemia, hypothyroidism, CAD s/p PCI to proximal LAD (2022), carotid artery disease s/p right carotid stenting hospitalized from 4/27 to 12/14/23 with acute on chronic respiratory failure secondary to asthma and CHF exacerbation, who returns to the ED with shortness of breath.  MD assessment includes: COPD exacerbation and possible asthma exacerbation.    PT Comments  Pt in bed,  ready to void and get dressed. She is able to get out of bed. Walk to bathroom and bathe with set up in sitting and standing.  She is able to walk back, seated rest on bed.  Walk to mirror to brush hair, to recliner then take and additional lap in room.  She is generally SOB but sats 98% with O2 support.  Overall doing well with no LOB or buckling and good safety awareness.     If plan is discharge home, recommend the following: A little help with walking and/or transfers;A little help with bathing/dressing/bathroom;Assistance with cooking/housework;Assist for transportation   Can travel by private vehicle        Equipment Recommendations  None recommended by PT    Recommendations for Other Services       Precautions / Restrictions Precautions Precautions: Fall Recall of Precautions/Restrictions: Intact Restrictions Weight Bearing Restrictions Per Provider Order: No     Mobility  Bed Mobility Overal bed mobility: Modified Independent               Patient Response: Cooperative  Transfers Overall transfer level: Modified independent Equipment used: Rolling walker (2 wheels) Transfers: Sit to/from Stand Sit to Stand:  Modified independent (Device/Increase time)                Ambulation/Gait Ambulation/Gait assistance: Supervision Gait Distance (Feet): 35 Feet Assistive device: Rolling walker (2 wheels) Gait Pattern/deviations: Step-through pattern, Decreased step length - right, Decreased step length - left Gait velocity: decreased     General Gait Details: to and from bathroom, to and from sink from recliener and an additional lap in room with supervision   Stairs             Wheelchair Mobility     Tilt Bed Tilt Bed Patient Response: Cooperative  Modified Rankin (Stroke Patients Only)       Balance Overall balance assessment: Needs assistance Sitting-balance support: Feet supported Sitting balance-Leahy Scale: Good     Standing balance support: Bilateral upper extremity supported, During functional activity Standing balance-Leahy Scale: Fair                              Communication    Cognition Arousal: Alert Behavior During Therapy: WFL for tasks assessed/performed   PT - Cognitive impairments: No apparent impairments                                Cueing    Exercises      General Comments        Pertinent Vitals/Pain Pain Assessment Pain Assessment: No/denies pain    Home Living  Prior Function            PT Goals (current goals can now be found in the care plan section) Progress towards PT goals: Progressing toward goals    Frequency    Min 1X/week      PT Plan      Co-evaluation              AM-PAC PT "6 Clicks" Mobility   Outcome Measure  Help needed turning from your back to your side while in a flat bed without using bedrails?: None Help needed moving from lying on your back to sitting on the side of a flat bed without using bedrails?: None Help needed moving to and from a bed to a chair (including a wheelchair)?: None Help needed standing up from a chair  using your arms (e.g., wheelchair or bedside chair)?: None Help needed to walk in hospital room?: A Little Help needed climbing 3-5 steps with a railing? : A Little 6 Click Score: 22    End of Session Equipment Utilized During Treatment: Oxygen  Activity Tolerance: Patient tolerated treatment well Patient left: in chair;with call bell/phone within reach;with chair alarm set Nurse Communication: Mobility status PT Visit Diagnosis: Muscle weakness (generalized) (M62.81);Unsteadiness on feet (R26.81);Difficulty in walking, not elsewhere classified (R26.2)     Time: 0454-0981 PT Time Calculation (min) (ACUTE ONLY): 19 min  Charges:    $Gait Training: 8-22 mins PT General Charges $$ ACUTE PT VISIT: 1 Visit                   Charlanne Cong, PTA 12/21/23, 2:34 PM

## 2023-12-22 ENCOUNTER — Encounter (INDEPENDENT_AMBULATORY_CARE_PROVIDER_SITE_OTHER): Payer: Medicare Other

## 2023-12-22 ENCOUNTER — Ambulatory Visit (INDEPENDENT_AMBULATORY_CARE_PROVIDER_SITE_OTHER): Payer: Medicare Other | Admitting: Vascular Surgery

## 2023-12-22 ENCOUNTER — Other Ambulatory Visit (HOSPITAL_COMMUNITY): Payer: Self-pay

## 2023-12-22 DIAGNOSIS — J441 Chronic obstructive pulmonary disease with (acute) exacerbation: Secondary | ICD-10-CM | POA: Diagnosis not present

## 2023-12-22 LAB — BASIC METABOLIC PANEL WITH GFR
Anion gap: 9 (ref 5–15)
BUN: 44 mg/dL — ABNORMAL HIGH (ref 8–23)
CO2: 28 mmol/L (ref 22–32)
Calcium: 7.8 mg/dL — ABNORMAL LOW (ref 8.9–10.3)
Chloride: 97 mmol/L — ABNORMAL LOW (ref 98–111)
Creatinine, Ser: 0.87 mg/dL (ref 0.44–1.00)
GFR, Estimated: >60 mL/min (ref >60)
Glucose, Bld: 264 mg/dL — ABNORMAL HIGH (ref 70–99)
Potassium: 4.4 mmol/L (ref 3.5–5.1)
Sodium: 134 mmol/L — ABNORMAL LOW (ref 135–145)

## 2023-12-22 LAB — GLUCOSE, CAPILLARY
Glucose-Capillary: 254 mg/dL — ABNORMAL HIGH (ref 70–99)
Glucose-Capillary: 337 mg/dL — ABNORMAL HIGH (ref 70–99)
Glucose-Capillary: 248 mg/dL — ABNORMAL HIGH (ref 70–99)
Glucose-Capillary: 299 mg/dL — ABNORMAL HIGH (ref 70–99)

## 2023-12-22 LAB — CBC
HCT: 35.3 % — ABNORMAL LOW (ref 36.0–46.0)
Hemoglobin: 11.1 g/dL — ABNORMAL LOW (ref 12.0–15.0)
MCH: 22.3 pg — ABNORMAL LOW (ref 26.0–34.0)
MCHC: 31.4 g/dL (ref 30.0–36.0)
MCV: 70.9 fL — ABNORMAL LOW (ref 80.0–100.0)
Platelets: 257 10*3/uL (ref 150–400)
RBC: 4.98 MIL/uL (ref 3.87–5.11)
RDW: 17.6 % — ABNORMAL HIGH (ref 11.5–15.5)
WBC: 24.3 10*3/uL — ABNORMAL HIGH (ref 4.0–10.5)
nRBC: 0 % (ref 0.0–0.2)

## 2023-12-22 LAB — PROTIME-INR
INR: 2.4 — ABNORMAL HIGH (ref 0.8–1.2)
Prothrombin Time: 26.2 s — ABNORMAL HIGH (ref 11.4–15.2)

## 2023-12-22 MED ORDER — WARFARIN SODIUM 2.5 MG PO TABS
2.5000 mg | ORAL_TABLET | Freq: Once | ORAL | Status: AC
  Administered 2023-12-22: 2.5 mg via ORAL
  Filled 2023-12-22: qty 1

## 2023-12-22 MED ORDER — ORAL CARE MOUTH RINSE: 15.0000 mL | OROMUCOSAL | Status: DC | PRN

## 2023-12-22 NOTE — Progress Notes (Addendum)
 PROGRESS NOTE   HPI was taken from Dr. Vallarie Gauze: Molly Hodges is a 89 y.o. female with medical history significant for asthma/COPD complicated by chronic hypoxic respiratory failure on 2 L with exertion, severe mitral stenosis, HFpEF, atrial fibrillation on warfarin, hypertension, hyperlipidemia, hypothyroidism, CAD s/p PCI to proximal LAD (2022), carotid artery disease s/p right carotid stenting hospitalized from 4/27 to 12/14/23 with acute on chronic respiratory failure secondary to asthma and CHF exacerbation, who returns to the ED with shortness of breath.  States she was not quite improved when she was discharged and then the following morning woke up unable to breathe, and did not get relief with use of her inhalers.  She denies chest pain, fever or chills. ED course and data review: Tachypneic to 27 with O2 sat 97% on 2 L Labs notable for BNP 393 down from 532 a couple days prior, WBC 17,000, lactic acid 1.5, hemoglobin 11.9, INR 2.8EKG, showing A-fib at 85   CTA chest PE protocol negative for PE showing chronic findings with bronchial thickening-please refer to report.   Patient treated with Xopenex , Solu-Medrol , magnesium  but continued to have increased work of breathing.   Hospitalist consulted for admission.    Molly Hodges  ZOX:[id number] DOB: 1932/07/21 DOA: 12/15/2023 PCP: Lamon Pillow, MD   Assessment & Plan:   Principal Problem:   COPD with acute exacerbation [facility]) Active Problems:   Acute on chronic hypoxic respiratory failure (HCC)   COPD with acute bronchitis (HCC)   COPD exacerbation (HCC)  Assessment and Plan: COPD exacerbation: pt d/c on 4/28 & came back to hospital on 4/29. Continue to taper IV steroids (& should continue po steroid taper at home), continue on bronchodilators & encourage incentive spirometry. CTA chest was neg for PE & pneumonia. Rhinovirus/enterovirus positive on 12/14/23   Possible asthma exacerbation: unknown severity and/or stage.  Continue on IV steroid taper Continue on bronchodilators & encourage incentive spirometry   Chronic hypoxic respiratory failure: continue on supplemental oxygen , uses 2L [place] at home chronically    Chronic diastolic CHF: w/  mod-severe mitral stenosis. Continue on home dose of torsemide . Monitor I/Os  PAF: continue on diltiazem , warfarin. Pharmacy is dosing & monitoring warfarin & INR  Hx of CAD: continue on plavix , zetia , statin    Hx of PAD: continue on plavix , statin   HTN: continue on home dose of CCB        DVT prophylaxis: warfarin  Code Status: DNR Family Communication: discussed pt's care w/ pt's daughter, Molly Hodges, and answered her questions. Please call pt's daughter to give updates to Disposition Plan: likely d/c back home   Level of care: Progressive  Status is: Inpatient Remains inpatient appropriate because: severity of illness, still requiring IV steroids. Can likely in 24-48 hrs if respiratory status continues to improve and if pt can be switched to po steroids     Consultants:    Procedures:   Antimicrobials:   Subjective: Pt c/o malaise  Objective: Vitals:   12/21/23 2328 12/22/23 0308 12/22/23 0620 12/22/23 0719  BP: (!) 117/59 136/66  (!) 102/45  Pulse: 76 68  82  Resp: 20 16  18   Temp: (!) 97.4 F (36.3 C) 97.7 F (36.5 C)  98.6 F (37 C)  TempSrc:      SpO2: 98% 100% 100% 99%  Weight:      Height:        Intake/Output Summary (Last 24 hours) at 12/22/2023 0904 Last data filed at 12/22/2023 0400 Gross  per 24 hour  Intake 720 ml  Output --  Net 720 ml   Filed Weights   12/15/23 1612 12/16/23 2200  Weight: 54.3 kg 59.2 kg    Examination:  General exam: appears comfortable  Respiratory system: diminished breath sounds b/l Cardiovascular system:  S1/S2+. No rubs or gallops  Gastrointestinal system: abd is oft, NT, ND & hypoactive bowel sounds  Central nervous system: alert & oriented. Moves all extremities  Psychiatry: Judgement and  insight appears normal. Appropriate mood and affect    Data Reviewed: I have personally reviewed following labs and imaging studies  CBC: Recent Labs  Lab 12/18/23 0511 12/19/23 0528 12/20/23 0508 12/21/23 0521 12/22/23 0431  WBC 19.0* 18.5* 20.5* 22.3* 24.3*  HGB 11.1* 11.5* 11.2* 11.3* 11.1*  HCT 35.4* 35.8* 35.9* 36.2 35.3*  MCV 70.8* 70.5* 70.8* 70.8* 70.9*  PLT 223 235 236 260 257   Basic Metabolic Panel: Recent Labs  Lab 12/18/23 0511 12/19/23 0528 12/20/23 0508 12/21/23 0521 12/22/23 0431  NA 131* 135 136 134* 134*  K 4.3 4.5 5.0 4.8 4.4  CL 98 98 97* 95* 97*  CO2 24 26 28 27 28   GLUCOSE 252* 263* 301* 274* 264*  BUN 36* 39* 38* 40* 44*  CREATININE 0.92 0.83 0.98 0.89 0.87  CALCIUM  8.4* 8.4* 8.3* 8.1* 7.8*   GFR: Estimated Creatinine Clearance: 38.7 mL/min (by C-G formula based on SCr of 0.87 mg/dL). Liver Function Tests: No results for input(s): "AST", "ALT", "ALKPHOS", "BILITOT", "PROT", "ALBUMIN" in the last 168 hours. No results for input(s): "LIPASE", "AMYLASE" in the last 168 hours. No results for input(s): "AMMONIA" in the last 168 hours. Coagulation Profile: Recent Labs  Lab 12/18/23 0511 12/19/23 0528 12/20/23 0508 12/21/23 0521 12/22/23 0751  INR 3.0* 3.4* 4.3* 3.4* 2.4*   Cardiac Enzymes: No results for input(s): "CKTOTAL", "CKMB", "CKMBINDEX", "TROPONINI" in the last 168 hours. BNP (last 3 results) No results for input(s): "PROBNP" in the last 8760 hours. HbA1C: No results for input(s): "HGBA1C" in the last 72 hours. CBG: Recent Labs  Lab 12/21/23 1237 12/21/23 1707 12/21/23 2135 12/22/23 0718  GLUCAP 385* 353* 214* 254*   Lipid Profile: No results for input(s): "CHOL", "HDL", "LDLCALC", "TRIG", "CHOLHDL", "LDLDIRECT" in the last 72 hours. Thyroid  Function Tests: No results for input(s): "TSH", "T4TOTAL", "FREET4", "T3FREE", "THYROIDAB" in the last 72 hours. Anemia Panel: No results for input(s): "VITAMINB12", "FOLATE",  "FERRITIN", "TIBC", "IRON", "RETICCTPCT" in the last 72 hours. Sepsis Labs: Recent Labs  Lab 12/15/23 1621  LATICACIDVEN 1.5    Recent Results (from the past 240 hours)  Resp panel by RT-PCR (RSV, Flu A&B, Covid) Anterior Nasal Swab     Status: None   Collection Time: 12/13/23  5:21 PM   Specimen: Anterior Nasal Swab  Result Value Ref Range Status   SARS Coronavirus 2 by RT PCR NEGATIVE NEGATIVE Final    Comment: (NOTE) SARS-CoV-2 target nucleic acids are NOT DETECTED.  The SARS-CoV-2 RNA is generally detectable in upper respiratory specimens during the acute phase of infection. The lowest concentration of SARS-CoV-2 viral copies this assay can detect is 138 copies/mL. A negative result does not preclude SARS-Cov-2 infection and should not be used as the sole basis for treatment or other patient management decisions. A negative result may occur with  improper specimen collection/handling, submission of specimen other than nasopharyngeal swab, presence of viral mutation(s) within the areas targeted by this assay, and inadequate number of viral copies(<138 copies/mL). A negative result must be combined  with clinical observations, patient history, and epidemiological information. The expected result is Negative.  Fact Sheet for Patients:  BloggerCourse.com  Fact Sheet for Healthcare Providers:  SeriousBroker.it  This test is no t yet approved or cleared by the [place]  FDA and  has been authorized for detection and/or diagnosis of SARS-CoV-2 by FDA under an Emergency Use Authorization (EUA). This EUA will remain  in effect (meaning this test can be used) for the duration of the COVID-19 declaration under Section 564(b)(1) of the Act, 21 U.S.C.section 360bbb-3(b)(1), unless the authorization is terminated  or revoked sooner.       Influenza A by PCR NEGATIVE NEGATIVE Final   Influenza B by PCR NEGATIVE NEGATIVE Final     Comment: (NOTE) The Xpert Xpress SARS-CoV-2/FLU/RSV plus assay is intended as an aid in the diagnosis of influenza from Nasopharyngeal swab specimens and should not be used as a sole basis for treatment. Nasal washings and aspirates are unacceptable for Xpert Xpress SARS-CoV-2/FLU/RSV testing.  Fact Sheet for Patients: BloggerCourse.com  Fact Sheet for Healthcare Providers: SeriousBroker.it  This test is not yet approved or cleared by the [place]  FDA and has been authorized for detection and/or diagnosis of SARS-CoV-2 by FDA under an Emergency Use Authorization (EUA). This EUA will remain in effect (meaning this test can be used) for the duration of the COVID-19 declaration under Section 564(b)(1) of the Act, 21 U.S.C. section 360bbb-3(b)(1), unless the authorization is terminated or revoked.     Resp Syncytial Virus by PCR NEGATIVE NEGATIVE Final    Comment: (NOTE) Fact Sheet for Patients: BloggerCourse.com  Fact Sheet for Healthcare Providers: SeriousBroker.it  This test is not yet approved or cleared by the [place]  FDA and has been authorized for detection and/or diagnosis of SARS-CoV-2 by FDA under an Emergency Use Authorization (EUA). This EUA will remain in effect (meaning this test can be used) for the duration of the COVID-19 declaration under Section 564(b)(1) of the Act, 21 U.S.C. section 360bbb-3(b)(1), unless the authorization is terminated or revoked.  Performed at [facility], [address] [place], [place] [address]   Blood culture (routine x 2)     Status: None   Collection Time: 12/13/23  5:21 PM   Specimen: BLOOD  Result Value Ref Range Status   Specimen Description BLOOD RIGHT ANTECUBITAL  Final   Special Requests   Final    BOTTLES DRAWN AEROBIC AND ANAEROBIC Blood Culture results may not be optimal due to an inadequate volume of  blood received in culture bottles   Culture   Final    NO GROWTH 5 DAYS Performed at [facility], [address], [place], [place] [address]    Report Status 12/18/2023 FINAL  Final  Blood culture (routine x 2)     Status: None   Collection Time: 12/13/23  8:43 PM   Specimen: BLOOD  Result Value Ref Range Status   Specimen Description BLOOD BLOOD LEFT ARM  Final   Special Requests   Final    BOTTLES DRAWN AEROBIC AND ANAEROBIC Blood Culture adequate volume   Culture   Final    NO GROWTH 5 DAYS Performed at [facility], [address], [place], [place] [address]    Report Status 12/18/2023 FINAL  Final  Respiratory (~20 pathogens) panel by PCR     Status: Abnormal   Collection Time: 12/14/23 10:55 AM   Specimen: Nasopharyngeal Swab; Respiratory  Result Value Ref Range Status   Adenovirus NOT DETECTED NOT  DETECTED Final   Coronavirus 229E NOT DETECTED NOT DETECTED Final    Comment: (NOTE) The Coronavirus on the Respiratory Panel, DOES NOT test for the novel  Coronavirus (2019 nCoV)    Coronavirus HKU1 NOT DETECTED NOT DETECTED Final   Coronavirus NL63 NOT DETECTED NOT DETECTED Final   Coronavirus OC43 NOT DETECTED NOT DETECTED Final   Metapneumovirus NOT DETECTED NOT DETECTED Final   Rhinovirus / Enterovirus DETECTED (A) NOT DETECTED Final   Influenza A NOT DETECTED NOT DETECTED Final   Influenza B NOT DETECTED NOT DETECTED Final   Parainfluenza Virus 1 NOT DETECTED NOT DETECTED Final   Parainfluenza Virus 2 NOT DETECTED NOT DETECTED Final   Parainfluenza Virus 3 NOT DETECTED NOT DETECTED Final   Parainfluenza Virus 4 NOT DETECTED NOT DETECTED Final   Respiratory Syncytial Virus NOT DETECTED NOT DETECTED Final   Bordetella pertussis NOT DETECTED NOT DETECTED Final   Bordetella Parapertussis NOT DETECTED NOT DETECTED Final   Chlamydophila pneumoniae NOT DETECTED NOT DETECTED Final   Mycoplasma pneumoniae NOT DETECTED NOT DETECTED Final    Comment:  Performed at [facility], [address] [place], [place] [address]         Radiology Studies: No results found.       Scheduled Meds:  clopidogrel   75 mg Oral Daily   diltiazem   120 mg Oral Daily   ezetimibe   10 mg Oral Daily   guaiFENesin   600 mg Oral BID   insulin aspart  0-6 Units Subcutaneous TID WC   ipratropium-albuterol   3 mL Nebulization BID   magnesium  oxide  400 mg Oral Daily   methylPREDNISolone  (SOLU-MEDROL ) injection  40 mg Intravenous Q12H   potassium chloride   20 mEq Oral Daily   pregabalin   50 mg Oral BID   rosuvastatin   20 mg Oral Daily   torsemide   20 mg Oral Daily   warfarin  2.5 mg Oral ONCE-1600   Warfarin - Pharmacist Dosing Inpatient   Does not apply q1600   Continuous Infusions:   LOS: 6 days      Alphonsus Jeans, MD [facility] Pager [phone]  If 7PM-7AM, please contact night-coverage [email or web address] 12/22/2023, 9:04 AM

## 2023-12-22 NOTE — Consult Note (Signed)
 PHARMACY - ANTICOAGULATION CONSULT NOTE  Pharmacy Consult for warfarin Indication: atrial fibrillation  Allergies  Allergen Reactions   Shellfish Allergy Anaphylaxis    Other reaction(s): Hallucination   Azithromycin  Other (See Comments)    Extreme burning sensation at IV site    Tamiflu [Oseltamivir Phosphate] Other (See Comments)    Reaction:  Hallucinations     Albuterol  Palpitations    Heart racing.    Albuterol  And Levalbuterol  Palpitations    Patient Measurements: Height: 5\' 5"  (165.1 cm) Weight: 59.2 kg (130 lb 8.2 oz) IBW/kg (Calculated) : 57 HEPARIN  DW (KG): 59.2  Vital Signs: Temp: 98.6 F (37 C) (05/06 0719) BP: 102/45 (05/06 0719) Pulse Rate: 82 (05/06 0719)  Labs: Recent Labs    12/20/23 0508 12/21/23 0521 12/22/23 0431 12/22/23 0751  HGB 11.2* 11.3* 11.1*  --   HCT 35.9* 36.2 35.3*  --   PLT 236 260 257  --   LABPROT 41.2* 34.4*  --  26.2*  INR 4.3* 3.4*  --  2.4*  CREATININE 0.98 0.89 0.87  --     Estimated Creatinine Clearance: 38.7 mL/min (by C-G formula based on SCr of 0.87 mg/dL).   Medical History: Past Medical History:  Diagnosis Date   Aortic atherosclerosis (HCC)    a. 05/2021 TEE: GrIII atheroma plaque involving the asc, transverse, and desc Ao.   Asthma    CAD (coronary artery disease)    a. 04/2021 Cath: LM nl, LAD 85p/m, D1 80, RI nl, LCX nl, RCA nl; b. 07/2021 PCI: pLAD (2.75x26 Onyx Frontier DES), D1 (2.5x22 Onyx Frontier DES).   Carotid artery disease (HCC)    a. s/p R carotid stenting (9mm x 7mm x 4cm long Exact stent); b. 06/2021 U/S: RICA 40-59%, LICA 40-59%.   CHF (congestive heart failure) (HCC)    Community acquired pneumonia    Essential hypertension    Glaucoma    Hyperlipidemia    Mitral regurgitation    a. TTE 08/2015: EF 60-65%, normal wall motion, mild MR, mildly dilated left atrium measuring 40 mm, RVSF normal, PASP normal; b. 05/2021 TEE: Moderate MR.   Mitral stenosis    a. 05/2021 L/RHC: Sev MS w/ mean  grad 13-96mmHg and MV area of 0.5-06.cm^2; b. 05/2021 TEE: EF 55-60%, no rwma, nl RV fxn, mod MR, mod MS (MV area by P1/2t: 1.61 cm^2 w/ mean grad of ).   Peripheral neuropathy    Persistent atrial fibrillation (HCC) 09/17/2015   a. s/p DCCV 11/15/2015; b. CHADS2VASc => 4 (HTN, age x 2, female)--> warfarin; c. 06/2021 recurrent afib-->amio added.   Squamous cell carcinoma of skin 12/18/2021   R dorsum hand, EDC   Squamous cell carcinoma of skin 12/31/2021   R hand dorsum, recurrent - excised 02/04/2022   Squamous cell carcinoma of skin 12/31/2021   L forearm - ED&C   Squamous cell carcinoma of skin 06/24/2022   R thumb webspace with wart virus - ED&C   Squamous cell carcinoma of skin 10/20/2022   L lat knee - tx with ED&C    Medications:  Home Dose: Warfarin 2.5 mg PO MWF, all other days 3.75 mg (has 2.5 mg tabs at home)   Assessment: 89 yo female with PMH including asthma/COPD complicated by chronic hypoxic respiratory failure on 2 L with exertion, severe mitral stenosis, HFpEF, atrial fibrillation on warfarin, hypertension, hyperlipidemia, hypothyroidism, CAD s/p PCI to proximal LAD (2022), carotid artery disease s/p right carotid stenting. Most recent admission 12/13/2023 with acute asthma and HF  exacerbation. Pharmacy consulted to manage warfarin while inpatient.  DDI: plavix  (increase bleeding) and methylpred (increase INR)  Date INR Warfarin dose Comments  4/28 2.3 3mg  Discharge home  4/29 2.8  Re-admission  4/30 2.6 3mg    5/1 2.6 3mg    5/2 3.0 2.5 mg   5/3 3.4 HOLD Hgb/Plt stable  5/4 4.3 HOLD Hgb/Plt stable  5/5 3.4 HOLD Hbg/Plt stable  5/6 2.4 2.5 mg    Goal of Therapy:  INR 2-3, with mitral valve stenosis, will try to aim for a goal of 2.5 to 3.    Plan:  INR is therapeutic. Will give warfarin 2.5 mg (home dose on MWF). INR jumped from 3.4 to 4.3 on 5/4, but now trending down due to held doses. The INR variation may be due to steroids. The methylpred was increased  from 40 BID to 60 mg BID on 5/2. Methylpred is being tapered off. Daily INR. CBC at least every 3 days.   Harlie Lieu, PharmD  12/22/2023

## 2023-12-22 NOTE — Plan of Care (Signed)

## 2023-12-22 NOTE — Plan of Care (Signed)

## 2023-12-22 NOTE — Inpatient Diabetes Management (Signed)
 Inpatient Diabetes Program Recommendations  AACE/ADA: New Consensus Statement on Inpatient Glycemic Control   Target Ranges:  Prepandial:   less than 140 mg/dL      Peak postprandial:   less than 180 mg/dL (1-2 hours)      Critically ill patients:  140 - 180 mg/dL    Latest Reference Range & Units 12/21/23 12:37 12/21/23 17:07 12/21/23 21:35 12/22/23 07:18  Glucose-Capillary 70 - 99 mg/dL 161 (H) 096 (H) 045 (H) 254 (H)   Review of Glycemic Control  Diabetes history: No Outpatient Diabetes medications: NA Current orders for Inpatient glycemic control: Novolog 0-6 units TID with meals; Solumedrol 40 mg Q12H  Inpatient Diabetes Program Recommendations:    Insulin: If steroids are continued as ordered, please consider ordering Semglee 5 units Q24H and adding Novolog 0-5 units at bedtime.  Thanks, Beacher Limerick, RN, MSN, CDCES Diabetes Coordinator Inpatient Diabetes Program [phone] (Team Pager from 8am to 5pm)

## 2023-12-23 ENCOUNTER — Other Ambulatory Visit: Payer: Self-pay | Admitting: *Deleted

## 2023-12-23 ENCOUNTER — Other Ambulatory Visit: Payer: Self-pay

## 2023-12-23 DIAGNOSIS — J441 Chronic obstructive pulmonary disease with (acute) exacerbation: Secondary | ICD-10-CM

## 2023-12-23 LAB — CBC
HCT: 37.2 % (ref 36.0–46.0)
Hemoglobin: 11.6 g/dL — ABNORMAL LOW (ref 12.0–15.0)
MCH: 22.1 pg — ABNORMAL LOW (ref 26.0–34.0)
MCHC: 31.2 g/dL (ref 30.0–36.0)
MCV: 71 fL — ABNORMAL LOW (ref 80.0–100.0)
Platelets: 251 10*3/uL (ref 150–400)
RBC: 5.24 MIL/uL — ABNORMAL HIGH (ref 3.87–5.11)
RDW: 17 % — ABNORMAL HIGH (ref 11.5–15.5)
WBC: 25 10*3/uL — ABNORMAL HIGH (ref 4.0–10.5)
nRBC: 0 % (ref 0.0–0.2)

## 2023-12-23 LAB — BASIC METABOLIC PANEL WITH GFR
Anion gap: 9 (ref 5–15)
BUN: 47 mg/dL — ABNORMAL HIGH (ref 8–23)
CO2: 28 mmol/L (ref 22–32)
Calcium: 7.7 mg/dL — ABNORMAL LOW (ref 8.9–10.3)
Chloride: 96 mmol/L — ABNORMAL LOW (ref 98–111)
Creatinine, Ser: 1.11 mg/dL — ABNORMAL HIGH (ref 0.44–1.00)
GFR, Estimated: 47 mL/min — ABNORMAL LOW (ref >60)
Glucose, Bld: 287 mg/dL — ABNORMAL HIGH (ref 70–99)
Potassium: 4.3 mmol/L (ref 3.5–5.1)
Sodium: 133 mmol/L — ABNORMAL LOW (ref 135–145)

## 2023-12-23 LAB — PROTIME-INR
INR: 2.1 — ABNORMAL HIGH (ref 0.8–1.2)
Prothrombin Time: 23.5 s — ABNORMAL HIGH (ref 11.4–15.2)

## 2023-12-23 LAB — GLUCOSE, CAPILLARY
Glucose-Capillary: 226 mg/dL — ABNORMAL HIGH (ref 70–99)
Glucose-Capillary: 372 mg/dL — ABNORMAL HIGH (ref 70–99)

## 2023-12-23 MED ORDER — INSULIN ASPART 100 UNIT/ML IJ SOLN: 0.0000 [IU] | Freq: Every day | INTRAMUSCULAR | Status: DC

## 2023-12-23 MED ORDER — PREDNISONE 10 MG PO TABS
ORAL_TABLET | ORAL | 0 refills | Status: AC
  Filled 2023-12-23: qty 12, 6d supply, fill #0

## 2023-12-23 MED ORDER — INSULIN ASPART 100 UNIT/ML IJ SOLN
0.0000 [IU] | Freq: Three times a day (TID) | INTRAMUSCULAR | Status: DC
  Administered 2023-12-23: 9 [IU] via SUBCUTANEOUS

## 2023-12-23 MED ORDER — WARFARIN SODIUM 2.5 MG PO TABS
2.5000 mg | ORAL_TABLET | Freq: Once | ORAL | Status: DC
Start: 2023-12-23 — End: 2023-12-23
  Filled 2023-12-23: qty 1

## 2023-12-23 NOTE — Progress Notes (Signed)
 Physical Therapy Treatment Patient Details Name: Molly Hodges MRN: [id number] DOB: [birth year] Today's Date: 12/23/2023   History of Present Illness Pt is a 89 y.o. female with medical history significant for asthma/COPD complicated by chronic hypoxic respiratory failure on 2 L with exertion, severe mitral stenosis, HFpEF, atrial fibrillation on warfarin, hypertension, hyperlipidemia, hypothyroidism, CAD s/p PCI to proximal LAD (2022), carotid artery disease s/p right carotid stenting hospitalized from 4/27 to 12/14/23 with acute on chronic respiratory failure secondary to asthma and CHF exacerbation, who returns to the ED with shortness of breath.  MD assessment includes: COPD exacerbation and possible asthma exacerbation.    PT Comments  Pt in recliner on arrival, O2 donned. Pt uses O2 PRN at home, hence we trial entire session today on room air, sats >96% throughout, despite some DOE which pt attributes to her AF. Rate remains <100bpm. DOE reocvers seated within 1-2 minutes. Pt AMB twice at 100ft, then 8x STS from recliner for strengthening of the legs. Asked MD about DC droplet precautions at this point in time. Asked RN about ad lib AMB in room. RN made aware of room air sats with activity. Will follow.     If plan is discharge home, recommend the following: A little help with walking and/or transfers;A little help with bathing/dressing/bathroom;Assistance with cooking/housework;Assist for transportation   Can travel by private vehicle        Equipment Recommendations  None recommended by PT    Recommendations for Other Services       Precautions / Restrictions Precautions Precautions: Fall Restrictions Weight Bearing Restrictions Per Provider Order: No     Mobility  Bed Mobility                    Transfers Overall transfer level: Modified independent Equipment used: Rolling walker (2 wheels) Transfers: Sit to/from Stand             General transfer comment:  no cues needed, is at baseline    Ambulation/Gait Ambulation/Gait assistance: Supervision Gait Distance (Feet): 96 Feet Assistive device: Rolling walker (2 wheels) Gait Pattern/deviations: Step-through pattern, WFL(Within Functional Limits) Gait velocity: 0.71m/s     General Gait Details: limited by DOE, 97% on room air, 98 BPM both post AMB   Stairs             Wheelchair Mobility     Tilt Bed    Modified Rankin (Stroke Patients Only)       Balance Overall balance assessment: Modified Independent (RW use for balance which is reported ot be at baseline today)                                          Communication    Cognition Arousal: Alert Behavior During Therapy: WFL for tasks assessed/performed   PT - Cognitive impairments: No apparent impairments                                Cueing    Exercises      General Comments        Pertinent Vitals/Pain Pain Assessment Pain Assessment: No/denies pain    Home Living                          Prior Function  PT Goals (current goals can now be found in the care plan section) Acute Rehab PT Goals Patient Stated Goal: To breathe better PT Goal Formulation: With patient Time For Goal Achievement: 01/01/24 Potential to Achieve Goals: Good Progress towards PT goals: Progressing toward goals    Frequency    Min 1X/week      PT Plan      Co-evaluation              AM-PAC PT "6 Clicks" Mobility   Outcome Measure  Help needed turning from your back to your side while in a flat bed without using bedrails?: None Help needed moving from lying on your back to sitting on the side of a flat bed without using bedrails?: None Help needed moving to and from a bed to a chair (including a wheelchair)?: None Help needed standing up from a chair using your arms (e.g., wheelchair or bedside chair)?: None Help needed to walk in hospital room?:  None Help needed climbing 3-5 steps with a railing? : A Little 6 Click Score: 23    End of Session Equipment Utilized During Treatment: Oxygen  Activity Tolerance: Patient tolerated treatment well Patient left: in chair;with call bell/phone within reach;with chair alarm set Nurse Communication: Mobility status PT Visit Diagnosis: Muscle weakness (generalized) (M62.81);Unsteadiness on feet (R26.81);Difficulty in walking, not elsewhere classified (R26.2)     Time: 1610-9604 PT Time Calculation (min) (ACUTE ONLY): 26 min  Charges:    $Therapeutic Activity: 23-37 mins PT General Charges $$ ACUTE PT VISIT: 1 Visit                    10:37 AM, 12/23/23 Dawn Eth, PT, DPT Physical Therapist - [facility]  [phone] (ASCOM)     Molly Hodges 12/23/2023, 10:35 AM

## 2023-12-23 NOTE — Plan of Care (Signed)
  Problem: Education: Goal: Knowledge of General Education information will improve Description: Including pain rating scale, medication(s)/side effects and non-pharmacologic comfort measures Outcome: Adequate for Discharge   Problem: Health Behavior/Discharge Planning: Goal: Ability to manage health-related needs will improve Outcome: Adequate for Discharge   Problem: Clinical Measurements: Goal: Ability to maintain clinical measurements within normal limits will improve Outcome: Adequate for Discharge Goal: Will remain free from infection Outcome: Adequate for Discharge Goal: Diagnostic test results will improve Outcome: Adequate for Discharge Goal: Respiratory complications will improve Outcome: Adequate for Discharge Goal: Cardiovascular complication will be avoided Outcome: Adequate for Discharge   Problem: Activity: Goal: Risk for activity intolerance will decrease Outcome: Adequate for Discharge   Problem: Nutrition: Goal: Adequate nutrition will be maintained Outcome: Adequate for Discharge   Problem: Coping: Goal: Level of anxiety will decrease Outcome: Adequate for Discharge   Problem: Elimination: Goal: Will not experience complications related to bowel motility Outcome: Adequate for Discharge Goal: Will not experience complications related to urinary retention Outcome: Adequate for Discharge   Problem: Pain Managment: Goal: General experience of comfort will improve and/or be controlled Outcome: Adequate for Discharge   Problem: Safety: Goal: Ability to remain free from injury will improve Outcome: Adequate for Discharge   Problem: Skin Integrity: Goal: Risk for impaired skin integrity will decrease Outcome: Adequate for Discharge   Problem: Education: Goal: Knowledge of disease or condition will improve Outcome: Adequate for Discharge Goal: Knowledge of the prescribed therapeutic regimen will improve Outcome: Adequate for Discharge Goal:  Individualized Educational Video(s) Outcome: Adequate for Discharge   Problem: Activity: Goal: Ability to tolerate increased activity will improve Outcome: Adequate for Discharge Goal: Will verbalize the importance of balancing activity with adequate rest periods Outcome: Adequate for Discharge   Problem: Respiratory: Goal: Ability to maintain a clear airway will improve Outcome: Adequate for Discharge Goal: Levels of oxygenation will improve Outcome: Adequate for Discharge Goal: Ability to maintain adequate ventilation will improve Outcome: Adequate for Discharge   Problem: Education: Goal: Ability to describe self-care measures that may prevent or decrease complications (Diabetes Survival Skills Education) will improve Outcome: Adequate for Discharge Goal: Individualized Educational Video(s) Outcome: Adequate for Discharge   Problem: Coping: Goal: Ability to adjust to condition or change in health will improve Outcome: Adequate for Discharge   Problem: Fluid Volume: Goal: Ability to maintain a balanced intake and output will improve Outcome: Adequate for Discharge   Problem: Health Behavior/Discharge Planning: Goal: Ability to identify and utilize available resources and services will improve Outcome: Adequate for Discharge Goal: Ability to manage health-related needs will improve Outcome: Adequate for Discharge   Problem: Metabolic: Goal: Ability to maintain appropriate glucose levels will improve Outcome: Adequate for Discharge   Problem: Nutritional: Goal: Maintenance of adequate nutrition will improve Outcome: Adequate for Discharge Goal: Progress toward achieving an optimal weight will improve Outcome: Adequate for Discharge   Problem: Skin Integrity: Goal: Risk for impaired skin integrity will decrease Outcome: Adequate for Discharge   Problem: Tissue Perfusion: Goal: Adequacy of tissue perfusion will improve Outcome: Adequate for Discharge

## 2023-12-23 NOTE — Consult Note (Signed)
 PHARMACY - ANTICOAGULATION CONSULT NOTE  Pharmacy Consult for warfarin Indication: atrial fibrillation  Allergies  Allergen Reactions   Shellfish Allergy Anaphylaxis    Other reaction(s): Hallucination   Azithromycin  Other (See Comments)    Extreme burning sensation at IV site    Tamiflu [Oseltamivir Phosphate] Other (See Comments)    Reaction:  Hallucinations     Albuterol  Palpitations    Heart racing.    Albuterol  And Levalbuterol  Palpitations    Patient Measurements: Height: 5\' 5"  (165.1 cm) Weight: 59.2 kg (130 lb 8.2 oz) IBW/kg (Calculated) : 57 HEPARIN  DW (KG): 59.2  Vital Signs: Temp: 97.6 F (36.4 C) (05/07 0725) Temp Source: Oral (05/07 0446) BP: 131/56 (05/07 0725) Pulse Rate: 56 (05/07 0725)  Labs: Recent Labs    12/21/23 0521 12/22/23 0431 12/22/23 0751 12/23/23 0349  HGB 11.3* 11.1*  --  11.6*  HCT 36.2 35.3*  --  37.2  PLT 260 257  --  251  LABPROT 34.4*  --  26.2* 23.5*  INR 3.4*  --  2.4* 2.1*  CREATININE 0.89 0.87  --  1.11*    Estimated Creatinine Clearance: 30.3 mL/min (A) (by C-G formula based on SCr of 1.11 mg/dL (H)).   Medical History: Past Medical History:  Diagnosis Date   Aortic atherosclerosis (HCC)    a. 05/2021 TEE: GrIII atheroma plaque involving the asc, transverse, and desc Ao.   Asthma    CAD (coronary artery disease)    a. 04/2021 Cath: LM nl, LAD 85p/m, D1 80, RI nl, LCX nl, RCA nl; b. 07/2021 PCI: pLAD (2.75x26 Onyx Frontier DES), D1 (2.5x22 Onyx Frontier DES).   Carotid artery disease (HCC)    a. s/p R carotid stenting (9mm x 7mm x 4cm long Exact stent); b. 06/2021 U/S: RICA 40-59%, LICA 40-59%.   CHF (congestive heart failure) (HCC)    Community acquired pneumonia    Essential hypertension    Glaucoma    Hyperlipidemia    Mitral regurgitation    a. TTE 08/2015: EF 60-65%, normal wall motion, mild MR, mildly dilated left atrium measuring 40 mm, RVSF normal, PASP normal; b. 05/2021 TEE: Moderate MR.   Mitral  stenosis    a. 05/2021 L/RHC: Sev MS w/ mean grad 13-75mmHg and MV area of 0.5-06.cm^2; b. 05/2021 TEE: EF 55-60%, no rwma, nl RV fxn, mod MR, mod MS (MV area by P1/2t: 1.61 cm^2 w/ mean grad of ).   Peripheral neuropathy    Persistent atrial fibrillation (HCC) 09/17/2015   a. s/p DCCV 11/15/2015; b. CHADS2VASc => 4 (HTN, age x 2, female)--> warfarin; c. 06/2021 recurrent afib-->amio added.   Squamous cell carcinoma of skin 12/18/2021   R dorsum hand, EDC   Squamous cell carcinoma of skin 12/31/2021   R hand dorsum, recurrent - excised 02/04/2022   Squamous cell carcinoma of skin 12/31/2021   L forearm - ED&C   Squamous cell carcinoma of skin 06/24/2022   R thumb webspace with wart virus - ED&C   Squamous cell carcinoma of skin 10/20/2022   L lat knee - tx with ED&C    Medications:  Home Dose: Warfarin 2.5 mg PO MWF, all other days 3.75 mg (has 2.5 mg tabs at home)   Assessment: 89 yo female with PMH including asthma/COPD complicated by chronic hypoxic respiratory failure on 2 L with exertion, severe mitral stenosis, HFpEF, atrial fibrillation on warfarin, hypertension, hyperlipidemia, hypothyroidism, CAD s/p PCI to proximal LAD (2022), carotid artery disease s/p right carotid stenting. Most recent  admission 12/13/2023 with acute asthma and HF exacerbation. Pharmacy consulted to manage warfarin while inpatient.  DDI: plavix  (increase bleeding) and methylpred (increase INR)  INR jumped from 3.4 to 4.3 on 5/4, but now trending down due to held doses. May be due to steroids.  12/23/23: -Current dosing of Methylpred is 40mg  BID (decrease from 60mg  BID) -CBC WNL: platelets 251 (baseline), H&H 11.6/37.2 (baseline)  Date INR Warfarin dose Comments  4/28 2.3 3mg  Discharge home  4/29 2.8  Re-admission  4/30 2.6 3mg    5/1 2.6 3mg    5/2 3.0 2.5 mg   5/3 3.4 HOLD Hgb/Plt stable  5/4 4.3 HOLD Hgb/Plt stable  5/5 3.4 HOLD Hbg/Plt stable  5/6 2.4 2.5 mg   5/7 2.1 2.5 mg    Goal of  Therapy:  INR 2-3, with mitral valve stenosis, will try to aim for a goal of 2.5 to 3.    Plan:  -INR is therapeutic (on the lower end) -Will give warfarin 2.5 mg (home dose on MWF) -Daily INR. CBC at least every 3 days.   Ara Knee, PharmD Candidate 12/23/2023

## 2023-12-23 NOTE — TOC Transition Note (Signed)
 Transition of Care [facility]) - Discharge Note   Patient Details  Name: Molly Hodges MRN: [id number] Date of Birth: 1932-07-30  [facility] CM/SW Contact:  Glena Pharris C Kameah Rawl, RN Phone Number: 12/23/2023, 2:20 PM   Clinical Narrative:    Spoke with patient's daughter, [name] regarding discharge for [facility] PT/ OT. She is agreeable to [facility] and was given a choice of agencies for [place], [place].  Family has chosen Therapist, [facility]. They were advised a representative from [facility] will call within 48 hours. Patient's daughter is transporting patient home.   Referral sent and accepted by Shaun at [facility].   TOC signing off.           Patient Goals and CMS Choice            Discharge Placement                       Discharge Plan and Services Additional resources added to the After Visit Summary for                                       Social Drivers of Health (SDOH) Interventions SDOH Screenings   Food Insecurity: No Food Insecurity (12/16/2023)  Housing: Low Risk  (12/16/2023)  Transportation Needs: No Transportation Needs (12/16/2023)  Utilities: Not At Risk (12/16/2023)  Alcohol Screen: Low Risk  (10/21/2022)  Depression (PHQ2-9): Medium Risk (08/03/2023)  Financial Resource Strain: Low Risk  (10/15/2021)  Physical Activity: Inactive (10/21/2022)  Social Connections: Moderately Isolated (12/16/2023)  Stress: No Stress Concern Present (10/21/2022)  Tobacco Use: Low Risk  (12/15/2023)     Readmission Risk Interventions    08/07/2021    1:24 PM  Readmission Risk Prevention Plan  Transportation Screening Complete  PCP or Specialist Appt within 3-5 Days Complete  HRI or Home Care Consult Complete  Social Work Consult for Recovery Care Planning/Counseling Complete  Palliative Care Screening Not Applicable  Medication Review Oceanographer) Complete

## 2023-12-23 NOTE — Inpatient Diabetes Management (Signed)
 Inpatient Diabetes Program Recommendations  AACE/ADA: New Consensus Statement on Inpatient Glycemic Control (2015)  Target Ranges:  Prepandial:   less than 140 mg/dL      Peak postprandial:   less than 180 mg/dL (1-2 hours)      Critically ill patients:  140 - 180 mg/dL    Latest Reference Range & Units 12/22/23 07:18 12/22/23 11:42 12/22/23 16:43 12/22/23 20:46  Glucose-Capillary 70 - 99 mg/dL 981 (H)  3 units Novolog @1000  337 (H)  4 units Novolog  248 (H)  2 units Novolog  299 (H)  (H): Data is abnormally high  Latest Reference Range & Units 12/23/23 07:26  Glucose-Capillary 70 - 99 mg/dL 191 (H)  (H): Data is abnormally high   Diabetes history: No  Current orders: Novolog 0-6 units TID with meals    MD- Note pt getting Solumedrol 40 mg Q12H.  CBGs remain >200.    Please consider:  1. Start Semglee 5 units daily (0.1 units/kg)  2. Increase Novolog SSI to the 0-9 unit scale  3. May also want to add bedtime Novolog SSi coverage    --Will follow patient during hospitalization--  Langston Pippins RN, MSN, CDCES Diabetes Coordinator Inpatient Glycemic Control Team Team Pager: [phone] (8a-5p)

## 2023-12-23 NOTE — Discharge Summary (Signed)
 Molly Hodges JYN:[id number] DOB: 1931/11/18 DOA: 12/15/2023  PCP: Lamon Pillow, MD  Admit date: 12/15/2023 Discharge date: 12/23/2023  Time spent: 35 minutes  Recommendations for Outpatient Follow-up:  Pcp f/u 1 week, attention to kidney function and glucose at that f/u     Discharge Diagnoses:  Principal Problem:   COPD with acute exacerbation (HCC) Active Problems:   Acute on chronic hypoxic respiratory failure (HCC)   COPD with acute bronchitis (HCC)   COPD exacerbation (HCC)   Discharge Condition: stable  Diet recommendation: heart healthy  Filed Weights   12/15/23 1612 12/16/23 2200  Weight: 54.3 kg 59.2 kg    History of present illness:  From admission h and p Molly Hodges is a 89 y.o. female with medical history significant for asthma/COPD complicated by chronic hypoxic respiratory failure on 2 L with exertion, severe mitral stenosis, HFpEF, atrial fibrillation on warfarin, hypertension, hyperlipidemia, hypothyroidism, CAD s/p PCI to proximal LAD (2022), carotid artery disease s/p right carotid stenting hospitalized from 4/27 to 12/14/23 with acute on chronic respiratory failure secondary to asthma and CHF exacerbation, who returns to the ED with shortness of breath.  States she was not quite improved when she was discharged and then the following morning woke up unable to breathe, and did not get relief with use of her inhalers.  She denies chest pain, fever or chills.   Hospital Course:   COPD/asthma exacerbation: pt d/c on 4/28 & came back to hospital on 4/29. Continue to taper IV steroids (& should continue po steroid taper at home), continue on bronchodilators & encourage incentive spirometry. CTA chest was neg for PE & pneumonia. Rhinovirus/enterovirus positive on 12/14/23. Now breathing comfortably on room air, no wheeze or tachypnea.   Chronic hypoxic respiratory failure: continue on supplemental oxygen , uses 2L [place] at home chronically    Chronic  diastolic CHF: w/  mod-severe mitral stenosis. Continue on home dose of torsemide . Monitor I/Os   PAF: continue on diltiazem , warfarin. Therapeutic inr   Hx of CAD: continue on plavix , zetia , statin    Hx of PAD: continue on plavix , statin    HTN: continue on home dose of CCB   Prediabetes: with hyperglycemia 2/2 IV steroids, should improve with transition to oral steroids and taper, will need outpatient f/u  Procedures: none   Consultations: none  Discharge Exam: Vitals:   12/23/23 1030 12/23/23 1127  BP:  (!) 113/45  Pulse:  69  Resp:  18  Temp:  98.6 F (37 C)  SpO2: 97% 98%    General: NAD Cardiovascular: RRR, systolic murmur Respiratory: clear, no tachypnea or increased wob Ext: warm, no edema  Discharge Instructions   Discharge Instructions     Diet - low sodium heart healthy   Complete by: As directed    Increase activity slowly   Complete by: As directed       Allergies as of 12/23/2023       Reactions   Shellfish Allergy Anaphylaxis   Other reaction(s): Hallucination   Azithromycin  Other (See Comments)   Extreme burning sensation at IV site   Tamiflu [oseltamivir Phosphate] Other (See Comments)   Reaction:  Hallucinations    Albuterol  Palpitations   Heart racing.    Albuterol  And Levalbuterol  Palpitations        Medication List     TAKE these medications    arformoterol  15 MCG/2ML Nebu Commonly known as: Brovana  Take 2 mLs (15 mcg total) by nebulization 2 (two) times daily.  ascorbic acid  500 MG tablet Commonly known as: VITAMIN C  Take 500 mg by mouth daily.   budesonide  0.5 MG/2ML nebulizer solution Commonly known as: PULMICORT  Take 2 mLs (0.5 mg total) by nebulization 2 (two) times daily.   Calcium  Carbonate-Vitamin D  600-400 MG-UNIT tablet Take 1 tablet by mouth daily.   cholecalciferol  1000 units tablet Commonly known as: VITAMIN D  Take 1,000 Units by mouth daily.   clopidogrel  75 MG tablet Commonly known as:  PLAVIX  TAKE 1 TABLET BY MOUTH DAILY   diltiazem  120 MG 24 hr capsule Commonly known as: CARDIZEM  CD Take 1 capsule (120 mg total) by mouth daily.   dorzolamide  2 % ophthalmic solution Commonly known as: TRUSOPT  Place 1 drop into both eyes 2 (two) times daily.   ezetimibe  10 MG tablet Commonly known as: ZETIA  TAKE 1 TABLET BY MOUTH DAILY   ipratropium-albuterol  0.5-2.5 (3) MG/3ML Soln Commonly known as: DUONEB TAKE BY NEBULIZATION EVERY 4 HOURS AS NEEDED   latanoprost  0.005 % ophthalmic solution Commonly known as: XALATAN  Place 1 drop into both eyes at bedtime.   magnesium  oxide 400 MG tablet Commonly known as: MAG-OX Take 400 mg by mouth daily.   nitroGLYCERIN  0.4 MG SL tablet Commonly known as: Nitrostat  Place 1 tablet (0.4 mg total) under the tongue every 5 (five) minutes as needed (for chest pain or shortness of breath).   potassium chloride  10 MEQ tablet Commonly known as: KLOR-CON  TAKE 2 TABLETS BY MOUTH DAILY. TAKE EXTRA 2 TABLETS WHEN TAKING EXTRA LASIX .   predniSONE  10 MG tablet Commonly known as: DELTASONE  3 tabs daily for 2 days, then 2 tabs daily for 2 days, then 1 tab daily for 2 days   pregabalin  50 MG capsule Commonly known as: LYRICA  TAKE 1 CAPSULE BY MOUTH 2 TIMES DAILY   PreserVision AREDS 2+Multi Vit Caps Take 2 capsules by mouth daily.   rosuvastatin  20 MG tablet Commonly known as: CRESTOR  TAKE 1 TABLET BY MOUTH DAILY   torsemide  20 MG tablet Commonly known as: DEMADEX  Take 1 tablet (20 mg total) by mouth daily. You may take additional dose (20 mg tab) for 3 pound gain overnight or 5 pound gain in 1 week.   vitamin E  45 MG (100 UNITS) capsule Take 200 Units by mouth daily.   warfarin 2.5 MG tablet Commonly known as: COUMADIN  Take as directed. If you are unsure how to take this medication, talk to your nurse or doctor. Original instructions: TAKE 1 & 1/2 TABLETS DAILY EXCEPT TAKE ONE TABLET ON MONDAYS, WEDNESDAYS, AND FRIDAYS OR AS  DIRECTED BY COUMADIN  CLINIC       Allergies  Allergen Reactions   Shellfish Allergy Anaphylaxis    Other reaction(s): Hallucination   Azithromycin  Other (See Comments)    Extreme burning sensation at IV site    Tamiflu [Oseltamivir Phosphate] Other (See Comments)    Reaction:  Hallucinations     Albuterol  Palpitations    Heart racing.    Albuterol  And Levalbuterol  Palpitations    Follow-up Information     Lamon Pillow, MD Follow up.   Specialty: Family Medicine Contact information: [address] [place] [address] [phone]                  The results of significant diagnostics from this hospitalization (including imaging, microbiology, ancillary and laboratory) are listed below for reference.    Significant Diagnostic Studies: CT Angio Chest PE W and/or Wo Contrast Result Date: 12/15/2023 EXAM: CTA of the  Chest with contrast for PE 12/15/2023 09:37:30 PM TECHNIQUE: CTA of the chest was performed after the administration of intravenous contrast. Multiplanar reformatted images are provided for review. MIP images are provided for review. Automated exposure control, iterative reconstruction, and/or weight based adjustment of the mA/kV was utilized to reduce the radiation dose to as low as reasonably achievable. COMPARISON: Chest radiograph earlier today and CTA chest 01/26/2022. CLINICAL HISTORY: Pulmonary embolism (PE) suspected, high prob. Patient comes in from home via pow with complaints of SOB. Pt was discharged from the hospital yesterday afternoon. Patient states that she slept good last night at home, but when she woke up this morning, she couldn't breath. Pt did a breathing treatment and a rescue inhaler with no relief. Pt has a history of afib, pneumonia, and is on blood thinners. Pt is alert and oriented x4, visibly lethargic, and shob with talking. FINDINGS: PULMONARY ARTERIES: Pulmonary arteries are adequately opacified for evaluation. No  evidence of pulmonary embolism. Main pulmonary artery is normal in caliber. MEDIASTINUM: The heart and pericardium demonstrate no acute abnormality. Thoracic aortic atherosclerosis. LYMPH NODES: Stable mediastinal lymphadenopathy, including a dominant 16 mm short axis subcarinal node (image 68), favored to be reactive. LUNGS AND PLEURA: Bronchial wall thickening in the bilateral lower lobes, chronic peripheral subpleural scarring in the right lower lobe. Faint peribronchovascular ground-glass opacity/scarring, chronic. Scattered calcified granulomata, benign. No evidence of pleural effusion or pneumothorax. UPPER ABDOMEN: Atherosclerotic calcifications of the abdominal aorta and branch vessels. SOFT TISSUES AND BONES: No acute bone or soft tissue abnormality. IMPRESSION: 1. No evidence of pulmonary embolism. 2. Cardiomegaly. 3. Stable mediastinal lymphadenopathy, favored to be reactive. 4. Scattered areas of scarring and bronchial wall thickening, chronic. Electronically signed by: Zadie Herter MD 12/15/2023 09:46 PM EDT RP Workstation: [id number]   DG Chest 2 View Result Date: 12/15/2023 CLINICAL DATA:  Shortness of breath. EXAM: CHEST - 2 VIEW COMPARISON:  12/13/2023 FINDINGS: Stable enlarged cardiac silhouette. Stable coronary artery stent. Tortuous and partially calcified thoracic aorta. Clear lungs with normal vascularity. The lungs remain hyperexpanded with mild-to-moderate peribronchial thickening. No significant change in mild scarring at the right lateral lung base. Diffuse osteopenia. Interval right anterior chest electronic device. IMPRESSION: 1. No acute abnormality. 2. Stable cardiomegaly. 3. Stable changes of COPD and chronic bronchitis. Electronically Signed   By: Catherin Closs M.D.   On: 12/15/2023 16:56   DG Chest 2 View Result Date: 12/13/2023 EXAM: 2 VIEW(S) XRAY OF THE CHEST 12/13/2023 04:52:00 PM COMPARISON: 08/04/2023 CLINICAL HISTORY: SOB. R SOB. History of CHF. Recent 3 lb weight  gain. Also having increased weakness. FINDINGS: LUNGS AND PLEURA: Mild subpleural scarring at the lung bases. Possible trace right pleural effusion. No consolidation. No pulmonary edema. No pneumothorax. HEART AND MEDIASTINUM: No acute abnormality of the cardiac and mediastinal silhouettes. BONES AND SOFT TISSUES: No acute osseous abnormality. IMPRESSION: 1. No acute cardiopulmonary pathology. 2. Possible trace right pleural effusion. Electronically signed by: Zadie Herter MD 12/13/2023 07:10 PM EDT RP Workstation: [id number]    Microbiology: Recent Results (from the past 240 hours)  Resp panel by RT-PCR (RSV, Flu A&B, Covid) Anterior Nasal Swab     Status: None   Collection Time: 12/13/23  5:21 PM   Specimen: Anterior Nasal Swab  Result Value Ref Range Status   SARS Coronavirus 2 by RT PCR NEGATIVE NEGATIVE Final    Comment: (NOTE) SARS-CoV-2 target nucleic acids are NOT DETECTED.  The SARS-CoV-2 RNA is generally detectable in upper respiratory specimens during the acute phase of  infection. The lowest concentration of SARS-CoV-2 viral copies this assay can detect is 138 copies/mL. A negative result does not preclude SARS-Cov-2 infection and should not be used as the sole basis for treatment or other patient management decisions. A negative result may occur with  improper specimen collection/handling, submission of specimen other than nasopharyngeal swab, presence of viral mutation(s) within the areas targeted by this assay, and inadequate number of viral copies(<138 copies/mL). A negative result must be combined with clinical observations, patient history, and epidemiological information. The expected result is Negative.  Fact Sheet for Patients:  BloggerCourse.com  Fact Sheet for Healthcare Providers:  SeriousBroker.it  This test is no t yet approved or cleared by the [place]  FDA and  has been authorized for detection  and/or diagnosis of SARS-CoV-2 by FDA under an Emergency Use Authorization (EUA). This EUA will remain  in effect (meaning this test can be used) for the duration of the COVID-19 declaration under Section 564(b)(1) of the Act, 21 U.S.C.section 360bbb-3(b)(1), unless the authorization is terminated  or revoked sooner.       Influenza A by PCR NEGATIVE NEGATIVE Final   Influenza B by PCR NEGATIVE NEGATIVE Final    Comment: (NOTE) The Xpert Xpress SARS-CoV-2/FLU/RSV plus assay is intended as an aid in the diagnosis of influenza from Nasopharyngeal swab specimens and should not be used as a sole basis for treatment. Nasal washings and aspirates are unacceptable for Xpert Xpress SARS-CoV-2/FLU/RSV testing.  Fact Sheet for Patients: BloggerCourse.com  Fact Sheet for Healthcare Providers: SeriousBroker.it  This test is not yet approved or cleared by the [place]  FDA and has been authorized for detection and/or diagnosis of SARS-CoV-2 by FDA under an Emergency Use Authorization (EUA). This EUA will remain in effect (meaning this test can be used) for the duration of the COVID-19 declaration under Section 564(b)(1) of the Act, 21 U.S.C. section 360bbb-3(b)(1), unless the authorization is terminated or revoked.     Resp Syncytial Virus by PCR NEGATIVE NEGATIVE Final    Comment: (NOTE) Fact Sheet for Patients: BloggerCourse.com  Fact Sheet for Healthcare Providers: SeriousBroker.it  This test is not yet approved or cleared by the [place]  FDA and has been authorized for detection and/or diagnosis of SARS-CoV-2 by FDA under an Emergency Use Authorization (EUA). This EUA will remain in effect (meaning this test can be used) for the duration of the COVID-19 declaration under Section 564(b)(1) of the Act, 21 U.S.C. section 360bbb-3(b)(1), unless the authorization is terminated  or revoked.  Performed at [facility], [address] [place], [place] [address]   Blood culture (routine x 2)     Status: None   Collection Time: 12/13/23  5:21 PM   Specimen: BLOOD  Result Value Ref Range Status   Specimen Description BLOOD RIGHT ANTECUBITAL  Final   Special Requests   Final    BOTTLES DRAWN AEROBIC AND ANAEROBIC Blood Culture results may not be optimal due to an inadequate volume of blood received in culture bottles   Culture   Final    NO GROWTH 5 DAYS Performed at [facility], [address] [place], [place] [address]    Report Status 12/18/2023 FINAL  Final  Blood culture (routine x 2)     Status: None   Collection Time: 12/13/23  8:43 PM   Specimen: BLOOD  Result Value Ref Range Status   Specimen Description BLOOD BLOOD LEFT ARM  Final   Special Requests   Final    BOTTLES  DRAWN AEROBIC AND ANAEROBIC Blood Culture adequate volume   Culture   Final    NO GROWTH 5 DAYS Performed at [facility], [address] [place], [place] [address]    Report Status 12/18/2023 FINAL  Final  Respiratory (~20 pathogens) panel by PCR     Status: Abnormal   Collection Time: 12/14/23 10:55 AM   Specimen: Nasopharyngeal Swab; Respiratory  Result Value Ref Range Status   Adenovirus NOT DETECTED NOT DETECTED Final   Coronavirus 229E NOT DETECTED NOT DETECTED Final    Comment: (NOTE) The Coronavirus on the Respiratory Panel, DOES NOT test for the novel  Coronavirus (2019 nCoV)    Coronavirus HKU1 NOT DETECTED NOT DETECTED Final   Coronavirus NL63 NOT DETECTED NOT DETECTED Final   Coronavirus OC43 NOT DETECTED NOT DETECTED Final   Metapneumovirus NOT DETECTED NOT DETECTED Final   Rhinovirus / Enterovirus DETECTED (A) NOT DETECTED Final   Influenza A NOT DETECTED NOT DETECTED Final   Influenza B NOT DETECTED NOT DETECTED Final   Parainfluenza Virus 1 NOT DETECTED NOT DETECTED Final   Parainfluenza Virus 2 NOT DETECTED NOT DETECTED Final    Parainfluenza Virus 3 NOT DETECTED NOT DETECTED Final   Parainfluenza Virus 4 NOT DETECTED NOT DETECTED Final   Respiratory Syncytial Virus NOT DETECTED NOT DETECTED Final   Bordetella pertussis NOT DETECTED NOT DETECTED Final   Bordetella Parapertussis NOT DETECTED NOT DETECTED Final   Chlamydophila pneumoniae NOT DETECTED NOT DETECTED Final   Mycoplasma pneumoniae NOT DETECTED NOT DETECTED Final    Comment: Performed at [facility], [address], [place], [place] [address]     Labs: Basic Metabolic Panel: Recent Labs  Lab 12/19/23 0528 12/20/23 0508 12/21/23 0521 12/22/23 0431 12/23/23 0349  NA 135 136 134* 134* 133*  K 4.5 5.0 4.8 4.4 4.3  CL 98 97* 95* 97* 96*  CO2 26 28 27 28 28   GLUCOSE 263* 301* 274* 264* 287*  BUN 39* 38* 40* 44* 47*  CREATININE 0.83 0.98 0.89 0.87 1.11*  CALCIUM  8.4* 8.3* 8.1* 7.8* 7.7*   Liver Function Tests: No results for input(s): "AST", "ALT", "ALKPHOS", "BILITOT", "PROT", "ALBUMIN" in the last 168 hours. No results for input(s): "LIPASE", "AMYLASE" in the last 168 hours. No results for input(s): "AMMONIA" in the last 168 hours. CBC: Recent Labs  Lab 12/19/23 0528 12/20/23 0508 12/21/23 0521 12/22/23 0431 12/23/23 0349  WBC 18.5* 20.5* 22.3* 24.3* 25.0*  HGB 11.5* 11.2* 11.3* 11.1* 11.6*  HCT 35.8* 35.9* 36.2 35.3* 37.2  MCV 70.5* 70.8* 70.8* 70.9* 71.0*  PLT 235 236 260 257 251   Cardiac Enzymes: No results for input(s): "CKTOTAL", "CKMB", "CKMBINDEX", "TROPONINI" in the last 168 hours. BNP: BNP (last 3 results) Recent Labs    08/04/23 1607 12/13/23 1633 12/15/23 1620  BNP 401.0* 532.4* 393.5*    ProBNP (last 3 results) No results for input(s): "PROBNP" in the last 8760 hours.  CBG: Recent Labs  Lab 12/22/23 1142 12/22/23 1643 12/22/23 2046 12/23/23 0726 12/23/23 1128  GLUCAP 337* 248* 299* 226* 372*       Signed:  Raymonde Calico MD.  [facility] 12/23/2023, 1:08 PM

## 2023-12-24 ENCOUNTER — Telehealth: Payer: Self-pay

## 2023-12-24 DIAGNOSIS — I739 Peripheral vascular disease, unspecified: Secondary | ICD-10-CM | POA: Diagnosis not present

## 2023-12-24 DIAGNOSIS — G629 Polyneuropathy, unspecified: Secondary | ICD-10-CM | POA: Diagnosis not present

## 2023-12-24 DIAGNOSIS — Z7901 Long term (current) use of anticoagulants: Secondary | ICD-10-CM | POA: Diagnosis not present

## 2023-12-24 DIAGNOSIS — I110 Hypertensive heart disease with heart failure: Secondary | ICD-10-CM | POA: Diagnosis not present

## 2023-12-24 DIAGNOSIS — Z7902 Long term (current) use of antithrombotics/antiplatelets: Secondary | ICD-10-CM | POA: Diagnosis not present

## 2023-12-24 DIAGNOSIS — D509 Iron deficiency anemia, unspecified: Secondary | ICD-10-CM | POA: Diagnosis not present

## 2023-12-24 DIAGNOSIS — E041 Nontoxic single thyroid nodule: Secondary | ICD-10-CM | POA: Diagnosis not present

## 2023-12-24 DIAGNOSIS — J441 Chronic obstructive pulmonary disease with (acute) exacerbation: Secondary | ICD-10-CM | POA: Diagnosis not present

## 2023-12-24 DIAGNOSIS — I4819 Other persistent atrial fibrillation: Secondary | ICD-10-CM | POA: Diagnosis not present

## 2023-12-24 DIAGNOSIS — I6521 Occlusion and stenosis of right carotid artery: Secondary | ICD-10-CM | POA: Diagnosis not present

## 2023-12-24 DIAGNOSIS — Z7952 Long term (current) use of systemic steroids: Secondary | ICD-10-CM | POA: Diagnosis not present

## 2023-12-24 DIAGNOSIS — J9621 Acute and chronic respiratory failure with hypoxia: Secondary | ICD-10-CM | POA: Diagnosis not present

## 2023-12-24 DIAGNOSIS — E785 Hyperlipidemia, unspecified: Secondary | ICD-10-CM | POA: Diagnosis not present

## 2023-12-24 DIAGNOSIS — I5033 Acute on chronic diastolic (congestive) heart failure: Secondary | ICD-10-CM | POA: Diagnosis not present

## 2023-12-24 DIAGNOSIS — I493 Ventricular premature depolarization: Secondary | ICD-10-CM | POA: Diagnosis not present

## 2023-12-24 DIAGNOSIS — I050 Rheumatic mitral stenosis: Secondary | ICD-10-CM | POA: Diagnosis not present

## 2023-12-24 DIAGNOSIS — I700 Atherosclerosis of aorta: Secondary | ICD-10-CM | POA: Diagnosis not present

## 2023-12-24 DIAGNOSIS — I2510 Atherosclerotic heart disease of native coronary artery without angina pectoris: Secondary | ICD-10-CM | POA: Diagnosis not present

## 2023-12-24 DIAGNOSIS — E039 Hypothyroidism, unspecified: Secondary | ICD-10-CM | POA: Diagnosis not present

## 2023-12-24 DIAGNOSIS — I5020 Unspecified systolic (congestive) heart failure: Secondary | ICD-10-CM | POA: Diagnosis not present

## 2023-12-24 DIAGNOSIS — Z556 Problems related to health literacy: Secondary | ICD-10-CM | POA: Diagnosis not present

## 2023-12-24 DIAGNOSIS — Z85828 Personal history of other malignant neoplasm of skin: Secondary | ICD-10-CM | POA: Diagnosis not present

## 2023-12-24 NOTE — Progress Notes (Signed)
 Complex Care Management Note  Care Guide Note 12/24/2023 Name: Molly Hodges MRN: [id number] DOB: 01-20-1932  Molly Hodges is a 89 y.o. year old female who sees Fisher, Erlinda Haws, MD for primary care. I reached out to Edmon Gosling Lotz by phone today to offer complex care management services.  Ms. Mynes was given information about Complex Care Management services today including:   The Complex Care Management services include support from the care team which includes your Nurse Care Manager, Clinical Social Worker, or Pharmacist.  The Complex Care Management team is here to help remove barriers to the health concerns and goals most important to you. Complex Care Management services are voluntary, and the patient may decline or stop services at any time by request to their care team member.   Complex Care Management Consent Status: Patient agreed to services and verbal consent obtained.   Follow up plan:  Telephone appointment with complex care management team member scheduled for:  12/30/2023  Encounter Outcome:  Patient Scheduled  Lenton Rail , RMA     [place]  [facility], [facility] Guide  Direct Dial: [phone]  Website: [email or web address]

## 2023-12-25 ENCOUNTER — Telehealth: Payer: Self-pay

## 2023-12-25 ENCOUNTER — Encounter: Admitting: Family

## 2023-12-25 NOTE — Telephone Encounter (Addendum)
 I don't see anything in the records about that.the only thing I see on her record is the Brovana  and Pulmicort  prescribed by her pulmonologist.

## 2023-12-25 NOTE — Telephone Encounter (Signed)
 Copied from CRM [id number]. Topic: General - Other >> Dec 25, 2023 12:15 PM Alysia Jumbo S wrote: Reason for CRM: Robin with [employer] calling on behalf of patient. She states that patient was discharged from hospital and was suppose to receive an inhaler that does not contain albuterol . Inhaler was never received and wants to know if provider will send a prescription for patient.Request a callback to [name] or patient's daughter, [name].   Callback #  [name]) [phone] [name] Larey Plenty (pt's daughter) [phone]

## 2023-12-25 NOTE — Telephone Encounter (Signed)
 Left message for Molly Hodges-if [name] calls back please give provider's message

## 2023-12-28 ENCOUNTER — Telehealth: Payer: Self-pay

## 2023-12-28 NOTE — Telephone Encounter (Signed)
 Copied from CRM [id number]. Topic: Clinical - Home Health Verbal Orders >> Dec 28, 2023 10:22 AM Phil Braun wrote: Caller/Agency: Melodee Spruce, [facility] Callback Number: [phone] Service Requested: Physical Therapy Frequency: 1 time a  week for 9 weeks Any new concerns about the patient? No

## 2023-12-28 NOTE — Telephone Encounter (Signed)
 That's fine

## 2023-12-28 NOTE — Telephone Encounter (Signed)
 LM for Molly Hodges with [facility] to call us  back.

## 2023-12-28 NOTE — Telephone Encounter (Signed)
 Spoke with [name] and was given provider's message and she is going to call the Pulmonologist.

## 2023-12-29 ENCOUNTER — Ambulatory Visit: Payer: Self-pay

## 2023-12-29 DIAGNOSIS — Z0000 Encounter for general adult medical examination without abnormal findings: Secondary | ICD-10-CM

## 2023-12-29 NOTE — Progress Notes (Signed)
 Subjective:   Molly Hodges is a 89 y.o. who presents for a Medicare Wellness preventive visit.  As a reminder, Annual Wellness Visits don't include a physical exam, and some assessments may be limited, especially if this visit is performed virtually. We may recommend an in-person visit if needed.  Visit Complete: Virtual I connected with  Molly Hodges on 12/29/23 by a audio enabled telemedicine application and verified that I am speaking with the correct person using two identifiers.  Patient Location: Home  Provider Location: Office/Clinic  I discussed the limitations of evaluation and management by telemedicine. The patient expressed understanding and agreed to proceed.  Vital Signs: Because this visit was a virtual/telehealth visit, some criteria may be missing or patient reported. Any vitals not documented were not able to be obtained and vitals that have been documented are patient reported.  VideoDeclined- This patient declined Librarian, academic. Therefore the visit was completed with audio only.  Persons Participating in Visit: Patient.  AWV Questionnaire: No: Patient Medicare AWV questionnaire was not completed prior to this visit.  Cardiac Risk Factors include: advanced age (>64men, >100 women);hypertension;dyslipidemia;sedentary lifestyle     Objective:     Today's Vitals   12/29/23 1101  PainSc: 0-No pain   There is no height or weight on file to calculate BMI.     12/29/2023   11:07 AM 12/16/2023    8:30 AM 12/15/2023    4:16 PM 12/13/2023    4:32 PM 08/04/2023    4:00 PM 07/27/2023    9:53 AM 05/30/2023   12:35 AM  Advanced Directives  Does Patient Have a Medical Advance Directive? Yes Yes Yes Yes No No No  Type of Estate agent of [place];Living will Healthcare Power of [place];Living will  Healthcare Power of [place];Living will     Does patient want to make changes to medical advance directive?   No - Patient declined       Copy of Healthcare Power of Attorney in Chart? No - copy requested No - copy requested         Current Medications (verified) Outpatient Encounter Medications as of 12/29/2023  Medication Sig   arformoterol  (BROVANA ) 15 MCG/2ML NEBU Take 2 mLs (15 mcg total) by nebulization 2 (two) times daily.   budesonide  (PULMICORT ) 0.5 MG/2ML nebulizer solution Take 2 mLs (0.5 mg total) by nebulization 2 (two) times daily.   Calcium  Carbonate-Vitamin D  600-400 MG-UNIT tablet Take 1 tablet by mouth daily.   cholecalciferol  (VITAMIN D ) 1000 UNITS tablet Take 1,000 Units by mouth daily.   clopidogrel  (PLAVIX ) 75 MG tablet TAKE 1 TABLET BY MOUTH DAILY   diltiazem  (CARDIZEM  CD) 120 MG 24 hr capsule Take 1 capsule (120 mg total) by mouth daily.   dorzolamide  (TRUSOPT ) 2 % ophthalmic solution Place 1 drop into both eyes 2 (two) times daily.    ezetimibe  (ZETIA ) 10 MG tablet TAKE 1 TABLET BY MOUTH DAILY   ipratropium-albuterol  (DUONEB) 0.5-2.5 (3) MG/3ML SOLN TAKE BY NEBULIZATION EVERY 4 HOURS AS NEEDED   latanoprost  (XALATAN ) 0.005 % ophthalmic solution Place 1 drop into both eyes at bedtime.    magnesium  oxide (MAG-OX) 400 MG tablet Take 400 mg by mouth daily.   Multiple Vitamins-Minerals (PRESERVISION AREDS 2+MULTI VIT) CAPS Take 2 capsules by mouth daily.   nitroGLYCERIN  (NITROSTAT ) 0.4 MG SL tablet Place 1 tablet (0.4 mg total) under the tongue every 5 (five) minutes as needed (for chest pain or shortness of breath).  potassium chloride  (KLOR-CON ) 10 MEQ tablet TAKE 2 TABLETS BY MOUTH DAILY. TAKE EXTRA 2 TABLETS WHEN TAKING EXTRA LASIX .   pregabalin  (LYRICA ) 50 MG capsule TAKE 1 CAPSULE BY MOUTH 2 TIMES DAILY   rosuvastatin  (CRESTOR ) 20 MG tablet TAKE 1 TABLET BY MOUTH DAILY   torsemide  (DEMADEX ) 20 MG tablet Take 1 tablet (20 mg total) by mouth daily. You may take additional dose (20 mg tab) for 3 pound gain overnight or 5 pound gain in 1 week.   vitamin C  (ASCORBIC ACID )  500 MG tablet Take 500 mg by mouth daily.   vitamin E  100 UNIT capsule Take 200 Units by mouth daily.   warfarin (COUMADIN ) 2.5 MG tablet TAKE 1 & 1/2 TABLETS DAILY EXCEPT TAKE ONE TABLET ON MONDAYS, WEDNESDAYS, AND FRIDAYS OR AS DIRECTED BY COUMADIN  CLINIC   predniSONE  (DELTASONE ) 10 MG tablet Take 3 tablets (30 mg total) by mouth daily for 2 days, THEN 2 tablets (20 mg total) daily for 2 days, THEN 1 tablet (10 mg total) daily for 2 days. (Patient not taking: Reported on 12/29/2023)   No facility-administered encounter medications on file as of 12/29/2023.    Allergies (verified) Shellfish allergy, Azithromycin , Tamiflu [oseltamivir phosphate], Albuterol , and Albuterol  and levalbuterol    History: Past Medical History:  Diagnosis Date   Aortic atherosclerosis (HCC)    a. 05/2021 TEE: GrIII atheroma plaque involving the asc, transverse, and desc Ao.   Asthma    CAD (coronary artery disease)    a. 04/2021 Cath: LM nl, LAD 85p/m, D1 80, RI nl, LCX nl, RCA nl; b. 07/2021 PCI: pLAD (2.75x26 Onyx Frontier DES), D1 (2.5x22 Onyx Frontier DES).   Carotid artery disease (HCC)    a. s/p R carotid stenting (9mm x 7mm x 4cm long Exact stent); b. 06/2021 U/S: RICA 40-59%, LICA 40-59%.   CHF (congestive heart failure) (HCC)    Community acquired pneumonia    Essential hypertension    Glaucoma    Hyperlipidemia    Mitral regurgitation    a. TTE 08/2015: EF 60-65%, normal wall motion, mild MR, mildly dilated left atrium measuring 40 mm, RVSF normal, PASP normal; b. 05/2021 TEE: Moderate MR.   Mitral stenosis    a. 05/2021 L/RHC: Sev MS w/ mean grad 13-52mmHg and MV area of 0.5-06.cm^2; b. 05/2021 TEE: EF 55-60%, no rwma, nl RV fxn, mod MR, mod MS (MV area by P1/2t: 1.61 cm^2 w/ mean grad of ).   Peripheral neuropathy    Persistent atrial fibrillation (HCC) 09/17/2015   a. s/p DCCV 11/15/2015; b. CHADS2VASc => 4 (HTN, age x 2, female)--> warfarin; c. 06/2021 recurrent afib-->amio added.   Squamous  cell carcinoma of skin 12/18/2021   R dorsum hand, EDC   Squamous cell carcinoma of skin 12/31/2021   R hand dorsum, recurrent - excised 02/04/2022   Squamous cell carcinoma of skin 12/31/2021   L forearm - ED&C   Squamous cell carcinoma of skin 06/24/2022   R thumb webspace with wart virus - ED&C   Squamous cell carcinoma of skin 10/20/2022   L lat knee - tx with ED&C   Past Surgical History:  Procedure Laterality Date   ABDOMINAL HYSTERECTOMY  1987   due to heavy bleeding   APPENDECTOMY     CARDIAC CATHETERIZATION     CAROTID PTA/STENT INTERVENTION Right 08/15/2020   Procedure: CAROTID PTA/STENT INTERVENTION;  Surgeon: Celso College, MD;  Location: [facility] INVASIVE CV LAB;  Service: Cardiovascular;  Laterality: Right;   CATARACT EXTRACTION  CORONARY IMAGING/OCT N/A 08/02/2021   Procedure: INTRAVASCULAR IMAGING/OCT;  Surgeon: Sammy Crisp, MD;  Location: [facility];  Service: Cardiovascular;  Laterality: N/A;   CORONARY STENT INTERVENTION N/A 08/02/2021   Procedure: CORONARY STENT INTERVENTION;  Surgeon: Sammy Crisp, MD;  Location: [facility];  Service: Cardiovascular;  Laterality: N/A;   ELECTROPHYSIOLOGIC STUDY N/A 11/15/2015   Procedure: CARDIOVERSION;  Surgeon: Devorah Fonder, MD;  Location: [facility] ORS;  Service: Cardiovascular;  Laterality: N/A;   LEFT HEART CATH AND CORONARY ANGIOGRAPHY N/A 08/02/2021   Procedure: LEFT HEART CATH AND CORONARY ANGIOGRAPHY;  Surgeon: Sammy Crisp, MD;  Location: [facility];  Service: Cardiovascular;  Laterality: N/A;   LEFT HEART CATH AND CORONARY ANGIOGRAPHY N/A 08/07/2021   Procedure: LEFT HEART CATH AND CORONARY ANGIOGRAPHY;  Surgeon: Wenona Hamilton, MD;  Location: [facility] INVASIVE CV [facility];  Service: Cardiovascular;  Laterality: N/A;   RIGHT/LEFT HEART CATH AND CORONARY ANGIOGRAPHY N/A 05/17/2021   Procedure: RIGHT/LEFT HEART CATH AND CORONARY ANGIOGRAPHY;  Surgeon: Sammy Crisp, MD;  Location: [facility]  INVASIVE CV LAB;  Service: Cardiovascular;  Laterality: N/A;   TEE WITHOUT CARDIOVERSION N/A 06/13/2021   Procedure: TRANSESOPHAGEAL ECHOCARDIOGRAM (TEE);  Surgeon: Devorah Fonder, MD;  Location: [facility] ORS;  Service: Cardiovascular;  Laterality: N/A;   Family History  Problem Relation Age of Onset   CAD Mother    CAD Father    Cancer Son 81       lung cancer   Social History   Socioeconomic History   Marital status: Married    Spouse name: Not on file   Number of children: 4   Years of education: Not on file   Highest education level: 12th grade  Occupational History   Occupation: retired  Tobacco Use   Smoking status: Never   Smokeless tobacco: Never   Tobacco comments:    never   Vaping Use   Vaping status: Never Used  Substance and Sexual Activity   Alcohol use: No   Drug use: Never   Sexual activity: Not on file  Other Topics Concern   Not on file  Social History Narrative   Lives at home with Husband. Active and Independent at baseline.   Social Drivers of Corporate investment banker Strain: Low Risk  (12/29/2023)   Overall Financial Resource Strain (CARDIA)    Difficulty of Paying Living Expenses: Not hard at all  Food Insecurity: No Food Insecurity (12/29/2023)   Hunger Vital Sign    Worried About Running Out of Food in the Last Year: Never true    Ran Out of Food in the Last Year: Never true  Transportation Needs: No Transportation Needs (12/29/2023)   PRAPARE - Administrator, Civil Service (Medical): No    Lack of Transportation (Non-Medical): No  Physical Activity: Insufficiently Active (12/29/2023)   Exercise Vital Sign    Days of Exercise per Week: 2 days    Minutes of Exercise per Session: 60 min  Stress: No Stress Concern Present (12/29/2023)   [employer] of Occupational Health - Occupational Stress Questionnaire    Feeling of Stress : Not at all  Social Connections: Moderately Isolated (12/29/2023)   Social Connection and  Isolation Panel [NHANES]    Frequency of Communication with Friends and Family: More than three times a week    Frequency of Social Gatherings with Friends and Family: More than three times a week    Attends Religious Services: Never  Active Member of Clubs or Organizations: No    Attends Banker Meetings: Never    Marital Status: Married    Tobacco Counseling Counseling given: Not Answered Tobacco comments: never     Clinical Intake:  Pre-visit preparation completed: Yes  Pain : No/denies pain Pain Score: 0-No pain     BMI - recorded: 20.8 Nutritional Status: BMI of 19-24  Normal Nutritional Risks: None Diabetes: No  Lab Results  Component Value Date   HGBA1C 6.9 (A) 12/12/2022   HGBA1C 6.2 (H) 08/06/2021     How often do you need to have someone help you when you read instructions, pamphlets, or other written materials from your doctor or pharmacy?: 1 - Never  Interpreter Needed?: No  Information entered by :: Dellie Fergusson, LPN   Activities of Daily Living    12/29/2023   11:09 AM 12/16/2023    8:30 AM  In your present state of health, do you have any difficulty performing the following activities:  Hearing? 0 0  Vision? 0 0  Difficulty concentrating or making decisions? 0 0  Walking or climbing stairs? 1   Dressing or bathing? 1   Doing errands, shopping? 1 1  Preparing Food and eating ? N   Using the Toilet? N   In the past six months, have you accidently leaked urine? N   Do you have problems with loss of bowel control? N   Managing your Medications? Y   Managing your Finances? N   Housekeeping or managing your Housekeeping? Y     Patient Care Team: Lamon Pillow, MD as PCP - General (Family Medicine) Jerelene Monday Deadra Everts, MD as PCP - Cardiology (Cardiology) Devorah Fonder, MD as Consulting Physician (Cardiology) Annell Kidney, MD as Referring Physician (Ophthalmology) Vonna Guardian Donald Frost, MD as Referring Physician (Vascular  Surgery) Carolanne Church , MD (Inactive) as Consulting Physician (Dermatology)  Indicate any recent Medical Services you may have received from other than [facility] providers in the past year (date may be approximate).     Assessment:    This is a routine wellness examination for Molly Hodges.  Hearing/Vision screen Hearing Screening - Comments:: NO AIDS Vision Screening - Comments:: WEARS GLASSES ALL THE TIME- DR.BRASINGTON   Goals Addressed             This Visit's Progress    DIET - REDUCE SUGAR INTAKE         Depression Screen     12/29/2023   11:05 AM 08/03/2023   10:57 AM 10/21/2022   11:41 AM 10/15/2021    1:20 PM 11/02/2020    1:09 PM 10/09/2020    1:28 PM 09/17/2020   10:12 AM  PHQ 2/9 Scores  PHQ - 2 Score 0 3 0 0 0 0 0  PHQ- 9 Score 0 8   0  0    Fall Risk     12/29/2023   11:08 AM 10/21/2022   11:35 AM 01/28/2022    3:55 PM 10/15/2021    1:26 PM 11/02/2020    1:09 PM  Fall Risk   Falls in the past year? 0 0 0 0 0  Number falls in past yr: 0 0 0 0 0  Injury with Fall? 0 0 0 0 0  Risk for fall due to : History of fall(s) Impaired balance/gait;No Fall Risks  No Fall Risks No Fall Risks  Follow up Falls prevention discussed;Falls evaluation completed Education provided;Falls prevention discussed Falls evaluation completed Falls evaluation completed Falls  evaluation completed    MEDICARE RISK AT HOME:  Medicare Risk at Home Any stairs in or around the home?: Yes If so, are there any without handrails?: No Home free of loose throw rugs in walkways, pet beds, electrical cords, etc?: Yes Adequate lighting in your home to reduce risk of falls?: Yes Life alert?: Yes Use of a cane, walker or w/c?: Yes (WALKER ALL THE TIME) Grab bars in the bathroom?: Yes Shower chair or bench in shower?: Yes Elevated toilet seat or a handicapped toilet?: Yes  TIMED UP AND GO:  Was the test performed?  No  Cognitive Function: 6CIT completed        12/29/2023   11:11 AM 10/21/2022    11:46 AM 11/05/2016    3:15 PM  6CIT Screen  What Year? 0 points 0 points 0 points  What month? 0 points 0 points 0 points  What time? 3 points 0 points 0 points  Count back from 20 0 points 0 points 0 points  Months in reverse 4 points 0 points 4 points  Repeat phrase 0 points 2 points 0 points  Total Score 7 points 2 points 4 points    Immunizations Immunization History  Administered Date(s) Administered   Fluad Quad(high Dose 65+) 05/18/2019, 05/02/2020, 05/29/2021, 06/13/2022   Fluad Trivalent(High Dose 65+) 05/19/2023   Influenza, High Dose Seasonal PF 08/28/2017, 05/29/2018   PFIZER(Purple Top)SARS-COV-2 Vaccination 10/13/2019, 11/03/2019, 07/26/2020   Pneumococcal Conjugate-13 05/30/2014   Pneumococcal Polysaccharide-23 06/09/1985, 06/15/2006   Zoster Recombinant(Shingrix) 05/05/2018, 07/20/2018    Screening Tests Health Maintenance  Topic Date Due   DEXA SCAN  Never done   DTaP/Tdap/Td (1 - Tdap) Never done   COVID-19 Vaccine (4 - 2024-25 season) 04/19/2023   INFLUENZA VACCINE  03/18/2024   Medicare Annual Wellness (AWV)  12/28/2024   Pneumonia Vaccine 81+ Years old  Completed   Zoster Vaccines- Shingrix  Completed   HPV VACCINES  Aged Out   Meningococcal B Vaccine  Aged Out    Health Maintenance  Health Maintenance Due  Topic Date Due   DEXA SCAN  Never done   DTaP/Tdap/Td (1 - Tdap) Never done   COVID-19 Vaccine (4 - 2024-25 season) 04/19/2023   Health Maintenance Items Addressed: UP TO DATE ON SHOTS EXCEPT PNA, WANTS NO MORE COVIDS  Additional Screening:  Vision Screening: Recommended annual ophthalmology exams for early detection of glaucoma and other disorders of the eye.  Dental Screening: Recommended annual dental exams for proper oral hygiene  Community Resource Referral / Chronic Care Management: CRR required this visit?  No   CCM required this visit?  No   Plan:    I have personally reviewed and noted the following in the patient's  chart:   Medical and social history Use of alcohol, tobacco or illicit drugs  Current medications and supplements including opioid prescriptions. Patient is not currently taking opioid prescriptions. Functional ability and status Nutritional status Physical activity Advanced directives List of other physicians Hospitalizations, surgeries, and ER visits in previous 12 months Vitals Screenings to include cognitive, depression, and falls Referrals and appointments  In addition, I have reviewed and discussed with patient certain preventive protocols, quality metrics, and best practice recommendations. A written personalized care plan for preventive services as well as general preventive health recommendations were provided to patient.   Pinky Bright, LPN   [id number]   After Visit Summary: (MyChart) Due to this being a telephonic visit, the after visit summary with patients personalized plan was  offered to patient via MyChart   Notes: Nothing significant to report at this time.

## 2023-12-29 NOTE — Patient Instructions (Signed)
 [name] , Thank you for taking time out of your busy schedule to complete your Annual Wellness Visit with me. I enjoyed our conversation and look forward to speaking with you again next year. I, as well as your care team,  appreciate your ongoing commitment to your health goals. Please review the following plan we discussed and let me know if I can assist you in the future.  Follow up Visits: Next Medicare AWV with our clinical staff: 01/04/25 @ 1:10 PM BY PHONE   Have you seen your provider in the last 6 months (3 months if uncontrolled diabetes)? Yes   Clinician Recommendations:  Aim for 30 minutes of exercise or brisk walking, 6-8 glasses of water, and 5 servings of fruits and vegetables each day. KEEP TAKING CARE OF YOURSELF!      This is a list of the screening recommended for you and due dates:  Health Maintenance  Topic Date Due   DEXA scan (bone density measurement)  Never done   DTaP/Tdap/Td vaccine (1 - Tdap) Never done   COVID-19 Vaccine (4 - 2024-25 season) 04/19/2023   Flu Shot  03/18/2024   Medicare Annual Wellness Visit  12/28/2024   Pneumonia Vaccine  Completed   Zoster (Shingles) Vaccine  Completed   HPV Vaccine  Aged Out   Meningitis B Vaccine  Aged Out    Advanced directives: (Copy Requested) Please bring a copy of your health care power of attorney and living will to the office to be added to your chart at your convenience. You can mail to [facility] [address] [place], [place] [address] or email to [email or web address] Advance Care Planning is important because it:  [x]  Makes sure you receive the medical care that is consistent with your values, goals, and preferences  [x]  It provides guidance to your family and loved ones and reduces their decisional burden about whether or not they are making the right decisions based on your wishes.  Follow the link provided in your after visit summary or read over the paperwork we have mailed to  you to help you started getting your Advance Directives in place. If you need assistance in completing these, please reach out to us  so that we can help you!

## 2023-12-30 ENCOUNTER — Other Ambulatory Visit: Payer: Self-pay

## 2023-12-30 DIAGNOSIS — J449 Chronic obstructive pulmonary disease, unspecified: Secondary | ICD-10-CM | POA: Diagnosis not present

## 2023-12-30 NOTE — Patient Outreach (Signed)
 Complex Care Management   Visit Note  12/30/2023  Name:  Molly Hodges MRN: [id number] DOB: 09-07-1931  Situation: Referral received for Complex Care Management related to Heart Failure, COPD, and asthma I obtained verbal consent from Patient.  Visit completed with Pedro Bourgeois  on the phone  Background:   Past Medical History:  Diagnosis Date   Aortic atherosclerosis (HCC)    a. 05/2021 TEE: GrIII atheroma plaque involving the asc, transverse, and desc Ao.   Asthma    CAD (coronary artery disease)    a. 04/2021 Cath: LM nl, LAD 85p/m, D1 80, RI nl, LCX nl, RCA nl; b. 07/2021 PCI: pLAD (2.75x26 Onyx Frontier DES), D1 (2.5x22 Onyx Frontier DES).   Carotid artery disease (HCC)    a. s/p R carotid stenting (9mm x 7mm x 4cm long Exact stent); b. 06/2021 U/S: RICA 40-59%, LICA 40-59%.   CHF (congestive heart failure) (HCC)    Community acquired pneumonia    Essential hypertension    Glaucoma    Hyperlipidemia    Mitral regurgitation    a. TTE 08/2015: EF 60-65%, normal wall motion, mild MR, mildly dilated left atrium measuring 40 mm, RVSF normal, PASP normal; b. 05/2021 TEE: Moderate MR.   Mitral stenosis    a. 05/2021 L/RHC: Sev MS w/ mean grad 13-16mmHg and MV area of 0.5-06.cm^2; b. 05/2021 TEE: EF 55-60%, no rwma, nl RV fxn, mod MR, mod MS (MV area by P1/2t: 1.61 cm^2 w/ mean grad of ).   Peripheral neuropathy    Persistent atrial fibrillation (HCC) 09/17/2015   a. s/p DCCV 11/15/2015; b. CHADS2VASc => 4 (HTN, age x 2, female)--> warfarin; c. 06/2021 recurrent afib-->amio added.   Squamous cell carcinoma of skin 12/18/2021   R dorsum hand, EDC   Squamous cell carcinoma of skin 12/31/2021   R hand dorsum, recurrent - excised 02/04/2022   Squamous cell carcinoma of skin 12/31/2021   L forearm - ED&C   Squamous cell carcinoma of skin 06/24/2022   R thumb webspace with wart virus - ED&C   Squamous cell carcinoma of skin 10/20/2022   L lat knee - tx with ED&C     Assessment: Patient Reported Symptoms:  Cognitive Cognitive Status: Able to follow simple commands, Alert and oriented to person, place, and time, Normal speech and language skills Cognitive/Intellectual Conditions Management [RPT]: None reported or documented in medical history or problem list      Neurological Neurological Review of Symptoms: No symptoms reported    HEENT HEENT Symptoms Reported: No symptoms reported      Cardiovascular Cardiovascular Symptoms Reported: Lightheadness (Patient reports she was lightheaded this morning when she got up. She laid back down and felt better once she got back up.) Does patient have uncontrolled Hypertension?: No Cardiovascular Conditions: Hypertension, Dysrhythmia, High blood cholesterol, Heart failure, Valvular disease Cardiovascular Management Strategies: Medication therapy, Weight management, Diet modification Do You Have a Working Readable Scale?: Yes Weight: 115 lb (52.2 kg)  Respiratory Respiratory Symptoms Reported: No symptoms reported Respiratory Conditions: Asthma, COPD Respiratory Comment: Patient has had 2 hospitalizations recently due to asthma/COPD. She reports compliance with her scheduled nebulizers. Denies symptoms at time of visit.  Endocrine Patient reports the following symptoms related to hypoglycemia or hyperglycemia : No symptoms reported Is patient diabetic?: No Endocrine Conditions: Thyroid  disorder Endocrine Management Strategies: Medication therapy  Gastrointestinal Gastrointestinal Symptoms Reported: No symptoms reported (Last BM today) Additional Gastrointestinal Details: Patient reports a good appetite.   Nutrition Risk Screen (CP): No  indicators present  Genitourinary Genitourinary Symptoms Reported: No symptoms reported    Integumentary Integumentary Symptoms Reported: No symptoms reported    Musculoskeletal Musculoskelatal Symptoms Reviewed: Weakness Additional Musculoskeletal Details: Patient  reports that her strength is coming back Musculoskeletal Management Strategies: Medical device Musculoskeletal Comment: PT from [facility] will be coming once a week (scheduled to come this afternoon) Falls in the past year?: No Number of falls in past year: 1 or less Was there an injury with Fall?: No Fall Risk Category Calculator: 0 Patient Fall Risk Level: Low Fall Risk Patient at Risk for Falls Due to: Impaired vision Fall risk Follow up: Falls evaluation completed, Education provided  Psychosocial Psychosocial Symptoms Reported: No symptoms reported     Quality of Family Relationships: helpful, involved, supportive Do you feel physically threatened by others?: No      12/30/2023   10:23 AM  Depression screen PHQ 2/9  Decreased Interest 0  Down, Depressed, Hopeless 0  PHQ - 2 Score 0    There were no vitals filed for this visit.  Medications Reviewed Today     Reviewed by Valaria Garland, RN (Registered Nurse) on 12/30/23 at 1016  Med List Status: <None>   Medication Order Taking? Sig Documenting Provider Last Dose Status Informant  arformoterol  (BROVANA ) 15 MCG/2ML NEBU [id number] Yes Take 2 mLs (15 mcg total) by nebulization 2 (two) times daily. Kasa, Kurian, MD Taking Active Self  budesonide  (PULMICORT ) 0.5 MG/2ML nebulizer solution [id number] Yes Take 2 mLs (0.5 mg total) by nebulization 2 (two) times daily. Kasa, Kurian, MD Taking Active Self  Calcium  Carbonate-Vitamin D  600-400 MG-UNIT tablet [id number] Yes Take 1 tablet by mouth daily. [provider] Taking Active Self  cholecalciferol  (VITAMIN D ) 1000 UNITS tablet [id number] Yes Take 1,000 Units by mouth daily. [provider] Taking Active Self  clopidogrel  (PLAVIX ) 75 MG tablet [id number] Yes TAKE 1 TABLET BY MOUTH DAILY Gollan, Timothy J, MD Taking Active Self  diltiazem  (CARDIZEM  CD) 120 MG 24 hr capsule [id number] Yes Take 1 capsule (120 mg total) by mouth daily. Gollan, Timothy J, MD Taking  Active Self  dorzolamide  (TRUSOPT ) 2 % ophthalmic solution [id number] Yes Place 1 drop into both eyes 2 (two) times daily.  [provider] Taking Active Self  ezetimibe  (ZETIA ) 10 MG tablet [id number] Yes TAKE 1 TABLET BY MOUTH DAILY Gollan, Timothy J, MD Taking Active Self  ipratropium-albuterol  (DUONEB) 0.5-2.5 (3) MG/3ML SOLN [id number] Yes TAKE BY NEBULIZATION EVERY 4 HOURS AS NEEDED Lamon Pillow, MD Taking Active Self           Med Note Meryle Acre, NANCY J   Wed Dec 16, 2023  2:28 AM) Pt prefers not to use because of side effects (heart racing).  latanoprost  (XALATAN ) 0.005 % ophthalmic solution [id number] Yes Place 1 drop into both eyes at bedtime.  [provider] Taking Active Self  magnesium  oxide (MAG-OX) 400 MG tablet [id number] Yes Take 400 mg by mouth daily. [provider] Taking Active Self  Multiple Vitamins-Minerals (PRESERVISION AREDS 2+MULTI VIT) CAPS [id number] Yes Take 2 capsules by mouth daily. [provider] Taking Active Self  nitroGLYCERIN  (NITROSTAT ) 0.4 MG SL tablet [id number] Yes Place 1 tablet (0.4 mg total) under the tongue every 5 (five) minutes as needed (for chest pain or shortness of breath). Morey Ar, NP Taking Active Self  potassium chloride  (KLOR-CON ) 10 MEQ tablet [id number] Yes TAKE 2 TABLETS BY MOUTH DAILY. TAKE EXTRA 2 TABLETS WHEN TAKING EXTRA LASIX . Gollan, Timothy J,  MD Taking Active Self  pregabalin  (LYRICA ) 50 MG capsule [id number] Yes TAKE 1 CAPSULE BY MOUTH 2 TIMES DAILY Lamon Pillow, MD Taking Active Self  rosuvastatin  (CRESTOR ) 20 MG tablet [id number] Yes TAKE 1 TABLET BY MOUTH DAILY Gollan, Timothy J, MD Taking Active Self  torsemide  (DEMADEX ) 20 MG tablet [id number] Yes Take 1 tablet (20 mg total) by mouth daily. You may take additional dose (20 mg tab) for 3 pound gain overnight or 5 pound gain in 1 week. Morey Ar, NP Taking Active Self  vitamin C  (ASCORBIC ACID ) 500 MG tablet [id number] Yes  Take 500 mg by mouth daily. [provider] Taking Active Self  vitamin E  100 UNIT capsule [id number]  Take 200 Units by mouth daily. [provider]  Active Self  warfarin (COUMADIN ) 2.5 MG tablet [id number]  TAKE 1 & 1/2 TABLETS DAILY EXCEPT TAKE ONE TABLET ON MONDAYS, WEDNESDAYS, AND FRIDAYS OR AS DIRECTED BY COUMADIN  CLINIC Gollan, Timothy J, MD  Active Self            Recommendation:   PCP Follow-up Specialty provider follow-up heart failure clinic 01/04/24 Continue to take medications as scheduled. Report new or worsening respiratory symptoms to your provider.  Follow Up Plan:   Telephone follow up appointment date/time:  01/07/24 at 11 AM  Theodora Fish, RN MSN [place]  [facility] RN Care Manager Direct Dial: [phone]  Fax: [phone]

## 2023-12-30 NOTE — Patient Instructions (Signed)
 Visit Information  Thank you for taking time to visit with me today. Please don't hesitate to contact me if I can be of assistance to you before our next scheduled appointment.  Our next appointment is by telephone on 01/07/24 at 11 AM Please call the care guide team at [phone] if you need to cancel or reschedule your appointment.   Following is a copy of your care plan:   Goals Addressed             This Visit's Progress    VBC[facility] RN Care Plan (long term)   On track    Problems:  Chronic Disease Management support and education needs related to Asthma, CHF, and COPD  Goal: Over the next 90 days the Patient will not experience hospital admission as evidenced by review of electronic medical record. Hospital Admissions in last 6 months = 2  Interventions:   Asthma: Provided patient with basic written and verbal Asthma education on self care/management/and exacerbation prevention Advised patient to track and manage Asthma triggers Provided instruction about proper use of medications used for management of Asthma including inhalers Advised patient to self assesses Asthma action plan zone and make appointment with provider if in the yellow zone for 48 hours without improvement   Heart Failure Interventions: Provided education on low sodium diet Assessed need for readable accurate scales in home Discussed importance of daily weight and advised patient to weigh and record daily Discussed the importance of keeping all appointments with provider  COPD Interventions: Provided patient with basic written and verbal COPD education on self care/management/and exacerbation prevention Advised patient to track and manage COPD triggers Provided instruction about proper use of medications used for management of COPD including inhalers Advised patient to self assesses COPD action plan zone and make appointment with provider if in the yellow zone for 48 hours without improvement  Patient  Self-Care Activities:  Attend all scheduled provider appointments Call provider office for new concerns or questions  Perform all self care activities independently  Take medications as prescribed   call office if I gain more than 2 pounds in one day or 5 pounds in one week use salt in moderation watch for swelling in feet, ankles and legs every day weigh myself daily identify and remove indoor air pollutants limit outdoor activity during cold weather eliminate symptom triggers at home  Plan:  Telephone follow up appointment with care management team member scheduled for:  01/07/24 at 11 AM          VBCI RN Care Plan (short term)   On track    Problems:  Chronic Disease Management support and education needs related to Asthma, CHF, and COPD  Goal: Over the next 30 days the Patient will attend all scheduled medical appointments: heart failure clinic, PCP as evidenced by completed visit notes uploaded to EMR        demonstrate a decrease Asthma in exacerbations as evidenced by patient report of no worsening of symptoms or no increased use of PRN nebulizer demonstrate Ongoing adherence to prescribed treatment plan for COPD as evidenced by medication compliance, symptom management demonstrate Ongoing health management independence as evidenced by continuing to check weight daily and blood pressure regularly        take all medications exactly as prescribed and will call provider for medication related questions as evidenced by medication compliance     Interventions:   Asthma: Provided patient with basic written and verbal Asthma education on self care/management/and exacerbation prevention  Advised patient to track and manage Asthma triggers Provided instruction about proper use of medications used for management of Asthma including inhalers Advised patient to self assesses Asthma action plan zone and make appointment with provider if in the yellow zone for 48 hours without  improvement   Heart Failure Interventions: Provided education on low sodium diet Assessed need for readable accurate scales in home Discussed importance of daily weight and advised patient to weigh and record daily Discussed the importance of keeping all appointments with provider  COPD Interventions: Provided patient with basic written and verbal COPD education on self care/management/and exacerbation prevention Advised patient to track and manage COPD triggers Provided instruction about proper use of medications used for management of COPD including inhalers Advised patient to self assesses COPD action plan zone and make appointment with provider if in the yellow zone for 48 hours without improvement  Patient Self-Care Activities:  Attend all scheduled provider appointments Call provider office for new concerns or questions  Perform all self care activities independently  Take medications as prescribed   call office if I gain more than 2 pounds in one day or 5 pounds in one week watch for swelling in feet, ankles and legs every day weigh myself daily identify and remove indoor air pollutants limit outdoor activity during cold weather eliminate symptom triggers at home  Plan:  Telephone follow up appointment with care management team member scheduled for:  01/07/24 at 11 AM             Please call the Suicide and Crisis Lifeline: 988 call 1-800-273-TALK (toll free, 24 hour hotline) if you are experiencing a Mental Health or Behavioral Health Crisis or need someone to talk to.  Patient verbalizes understanding of instructions and care plan provided today and agrees to view in MyChart. Active MyChart status and patient understanding of how to access instructions and care plan via MyChart confirmed with patient.     Molly Fish, RN MSN [place]  [facility] Health RN Care Manager Direct Dial: [phone]  Fax: [phone]  Asthma Action Plan, Adult An  asthma action plan helps you understand how to manage your asthma and what to do when you have an asthma attack. The action plan is a color-coded plan that lists the symptoms that indicate whether or not your condition is under control and what actions to take. If you have symptoms in the green zone, you are doing well. If you have symptoms in the yellow zone, you are having problems. If you have symptoms in the red zone, you need medical care right away. Follow the plan that you and your health care provider develop. Review your plan with your health care provider at each visit. What triggers your asthma? Knowing the things that can trigger an asthma attack or make your asthma symptoms worse is very important. Talk to your health care provider about your asthma triggers and how to avoid them. Record your known asthma triggers here: _______________ What is your personal best peak flow reading? If you use a peak flow meter, determine your personal best reading. Record it here: _______________ Marrie Sizer zone This zone means that your asthma is under control. You may not have any symptoms while you are in the green zone. This means that you: Have no coughing or wheezing, even while you are working or playing. Sleep through the night. Are breathing well. Have a peak flow reading that is above __________ (80% of your personal best or greater). If you  are in the green zone, continue to manage your asthma as directed. Take these medicines every day: Controller medicine and dosage: _______________ Controller medicine and dosage: _______________ Controller medicine and dosage: _______________ Controller medicine and dosage: _______________ Before exercise, use this reliever or rescue medicine: _______________ Call your health care provider if you are using a reliever or rescue medicine more than 2-3 times a week. Yellow zone Symptoms in this zone mean that your condition may be getting worse. You may have  symptoms that interfere with exercise, are noticeably worse after exposure to triggers, or are worse at the first sign of a cold (upper respiratory infection). These may include: Waking from sleep. Coughing, especially at night or first thing in the morning. Mild wheezing. Chest tightness. A peak flow reading that is __________ to __________ (50-79% of your personal best). If you have any of these symptoms: Add the following medicine to the ones that you use daily: Reliever or rescue medicine and dosage: _______________ Additional medicine and dosage: _______________ Call your health care provider if: You remain in the yellow zone for __________ hours. You are using a reliever or rescue medicine more than 2-3 times a week. Red zone Symptoms in this zone mean that you should get medical help right away. You will likely feel distressed and have symptoms at rest that restrict your activity. You are in the red zone if: You are breathing hard and quickly. Your nose opens wide, your ribs show, and your neck muscles become visible when you breathe in. Your lips, fingers, or toes are a bluish color. You have trouble speaking in full sentences. Your peak flow reading is less than __________ (less than 50% of your personal best). Your symptoms do not improve within 15-20 minutes after you use your reliever or rescue medicine (bronchodilator). If you have any of these symptoms: These symptoms represent a serious problem that is an emergency. Do not wait to see if the symptoms will go away. Get medical help right away. Call your local emergency services (911 in the [place] Do not drive yourself to the hospital. Use your reliever or rescue medicine. Start a nebulizer treatment or take 2-4 puffs from a metered-dose inhaler with a spacer. Repeat this action every 15-20 minutes until help arrives. Where to find more information You can find more information about asthma from: Centers for Disease Control  and Prevention: FootballExhibition.com.br American Lung Association: www.lung.org This information is not intended to replace advice given to you by your health care provider. Make sure you discuss any questions you have with your health care provider. Document Revised: 09/27/2020 Document Reviewed: 09/27/2020 Elsevier Patient Education  2024 [employer].

## 2023-12-31 NOTE — Telephone Encounter (Signed)
 LM for Melodee Spruce with Okay verbal orders for patient. (Confidential voicemail)

## 2024-01-01 ENCOUNTER — Telehealth: Payer: Self-pay | Admitting: Family

## 2024-01-01 NOTE — Telephone Encounter (Signed)
 Called to confirm/remind patient of their appointment at the [facility] on 01/04/24.   Appointment:   [x] Confirmed  [] Left mess   [] No answer/No voice mail  [] VM Full/unable to leave message  [] Phone not in service  Patient reminded to bring all medications and/or complete list.  Confirmed patient has transportation. Gave directions, instructed to utilize valet parking.

## 2024-01-04 ENCOUNTER — Encounter: Payer: Self-pay | Admitting: Family

## 2024-01-04 ENCOUNTER — Other Ambulatory Visit (HOSPITAL_COMMUNITY): Payer: Self-pay

## 2024-01-04 ENCOUNTER — Telehealth: Payer: Self-pay

## 2024-01-04 ENCOUNTER — Encounter: Admitting: Family

## 2024-01-04 ENCOUNTER — Ambulatory Visit: Attending: Family | Admitting: Family

## 2024-01-04 ENCOUNTER — Other Ambulatory Visit: Payer: Self-pay | Admitting: Cardiovascular Disease

## 2024-01-04 VITALS — BP 105/48 | HR 82 | Wt 119.0 lb

## 2024-01-04 DIAGNOSIS — R0602 Shortness of breath: Secondary | ICD-10-CM | POA: Diagnosis present

## 2024-01-04 DIAGNOSIS — I10 Essential (primary) hypertension: Secondary | ICD-10-CM

## 2024-01-04 DIAGNOSIS — J4489 Other specified chronic obstructive pulmonary disease: Secondary | ICD-10-CM | POA: Diagnosis not present

## 2024-01-04 DIAGNOSIS — Z955 Presence of coronary angioplasty implant and graft: Secondary | ICD-10-CM | POA: Diagnosis not present

## 2024-01-04 DIAGNOSIS — I340 Nonrheumatic mitral (valve) insufficiency: Secondary | ICD-10-CM | POA: Insufficient documentation

## 2024-01-04 DIAGNOSIS — I110 Hypertensive heart disease with heart failure: Secondary | ICD-10-CM | POA: Diagnosis not present

## 2024-01-04 DIAGNOSIS — J449 Chronic obstructive pulmonary disease, unspecified: Secondary | ICD-10-CM

## 2024-01-04 DIAGNOSIS — E785 Hyperlipidemia, unspecified: Secondary | ICD-10-CM | POA: Insufficient documentation

## 2024-01-04 DIAGNOSIS — I5033 Acute on chronic diastolic (congestive) heart failure: Secondary | ICD-10-CM | POA: Diagnosis present

## 2024-01-04 DIAGNOSIS — I5032 Chronic diastolic (congestive) heart failure: Secondary | ICD-10-CM | POA: Diagnosis not present

## 2024-01-04 DIAGNOSIS — E039 Hypothyroidism, unspecified: Secondary | ICD-10-CM | POA: Insufficient documentation

## 2024-01-04 DIAGNOSIS — I6529 Occlusion and stenosis of unspecified carotid artery: Secondary | ICD-10-CM | POA: Insufficient documentation

## 2024-01-04 DIAGNOSIS — Z7901 Long term (current) use of anticoagulants: Secondary | ICD-10-CM | POA: Diagnosis not present

## 2024-01-04 DIAGNOSIS — I739 Peripheral vascular disease, unspecified: Secondary | ICD-10-CM | POA: Diagnosis not present

## 2024-01-04 DIAGNOSIS — I2510 Atherosclerotic heart disease of native coronary artery without angina pectoris: Secondary | ICD-10-CM | POA: Diagnosis not present

## 2024-01-04 DIAGNOSIS — I480 Paroxysmal atrial fibrillation: Secondary | ICD-10-CM | POA: Insufficient documentation

## 2024-01-04 DIAGNOSIS — H409 Unspecified glaucoma: Secondary | ICD-10-CM | POA: Insufficient documentation

## 2024-01-04 NOTE — Telephone Encounter (Signed)
 Advanced [facility] Failure Patient Advocate Encounter   Test billing for this patients current coverage shows that Farxiga, Jardiance would have copays of $0 for 30 or 90 day supply. Medication assistance not needed at this time.  Kennis Peacock, CPhT Rx Patient Advocate Phone: [phone]

## 2024-01-04 NOTE — Progress Notes (Signed)
 Advanced Heart Failure Clinic Note    PCP: Lamon Pillow, MD (last seen 12/24) Cardiologist: Belva Boyden, MD / Morey Ar, NP (last seen 02/25)  Chief Complaint: shortness of breath   HPI:  Molly Hodges is a 89 y/o female with a history of asthma, carotid stenosis, CAD, hyperlipidemia, PAD, HTN, COPD, glaucoma, hypothyroidism, mitral stenosis, atrial fibrillation and chronic heart failure.   Diagnosed with A-fib January 2017 in the setting of pneumonia and UTI. Echo at that time showed normal LV function with moderately dilated left atrium. She was anticoagulated with Eliquis  and underwent cardioversion.   Underwent R/LHC in September 2022 which demonstrated severe mitral stenosis and moderate MR as well as severe single-vessel CAD in the LAD. Referred to CT surgery and underwent TEE which demonstrated moderate mitral stenosis with moderate MR. Given these findings she was scheduled to undergo PCI of DES on 08/02/2021. She tolerated the procedure well and was discharged home.   Admitted 12/22 by EMS. Initial labs showed BNP 555, troponin 1505>> 1284, D-dimer 12.17. EKG showed rate controlled A-fib without acute changes. CTA was negative for PE however lower lobe mucous plugging was noted with peribronchial thickening and patchy groundglass density concerning for atelectasis versus mild edema versus combination of the 2. She underwent LHC which showed patent stents (further details above). Her A-fib has been managed with amiodarone  and diltiazem  with Coumadin  for stroke prophylaxis.   Seen in [facility] 06/23.  Echo 09/03/23: EF 55-60%, normal RV, moderate LAE, mild/ mod MR, mod/ severe Molly  Admitted 12/13/23 with SOB X 3 days. BO 157/94. Placed on oxygen  @ 2L due to being tachypneic. WBC elevated 15.2. CXR with possible right pleural effusion. Started on lasix . + rhinovirus which exacerbated her asthma.   Admitted 12/15/23 due to SOB. Woke up unable to breath without improvement w/  inhalers. IV steroids given along with bronchodilators. CTA chest negative for PE / pneumonia.   She presents today for a post hospital visit with a chief complaint of shortness of breath. Has associated fatigue, lightheaded at times (improving), palpitations. Denies chest pain, abdominal distention, pedal edema. Sleeping well on 1 pillow. Has oxygen  at home but hasn't been wearing it.    ROS: All systems negative except what is listed in HPI, PMH and Problem List   Past Medical History:  Diagnosis Date   Aortic atherosclerosis (HCC)    a. 05/2021 TEE: GrIII atheroma plaque involving the asc, transverse, and desc Ao.   Asthma    CAD (coronary artery disease)    a. 04/2021 Cath: LM nl, LAD 85p/m, D1 80, RI nl, LCX nl, RCA nl; b. 07/2021 PCI: pLAD (2.75x26 Onyx Frontier DES), D1 (2.5x22 Onyx Frontier DES).   Carotid artery disease (HCC)    a. s/p R carotid stenting (9mm x 7mm x 4cm long Exact stent); b. 06/2021 U/S: RICA 40-59%, LICA 40-59%.   CHF (congestive heart failure) (HCC)    Community acquired pneumonia    Essential hypertension    Glaucoma    Hyperlipidemia    Mitral regurgitation    a. TTE 08/2015: EF 60-65%, normal wall motion, mild MR, mildly dilated left atrium measuring 40 mm, RVSF normal, PASP normal; b. 05/2021 TEE: Moderate MR.   Mitral stenosis    a. 05/2021 L/RHC: Sev Molly w/ mean grad 13-38mmHg and MV area of 0.5-06.cm^2; b. 05/2021 TEE: EF 55-60%, no rwma, nl RV fxn, mod MR, mod Molly (MV area by P1/2t: 1.61 cm^2 w/ mean grad of ).   Peripheral  neuropathy    Persistent atrial fibrillation (HCC) 09/17/2015   a. s/p DCCV 11/15/2015; b. CHADS2VASc => 4 (HTN, age x 2, female)--> warfarin; c. 06/2021 recurrent afib-->amio added.   Squamous cell carcinoma of skin 12/18/2021   R dorsum hand, EDC   Squamous cell carcinoma of skin 12/31/2021   R hand dorsum, recurrent - excised 02/04/2022   Squamous cell carcinoma of skin 12/31/2021   L forearm - ED&C   Squamous cell  carcinoma of skin 06/24/2022   R thumb webspace with wart virus - ED&C   Squamous cell carcinoma of skin 10/20/2022   L lat knee - tx with ED&C    Current Outpatient Medications  Medication Sig Dispense Refill   arformoterol  (BROVANA ) 15 MCG/2ML NEBU Take 2 mLs (15 mcg total) by nebulization 2 (two) times daily. 120 mL 5   budesonide  (PULMICORT ) 0.5 MG/2ML nebulizer solution Take 2 mLs (0.5 mg total) by nebulization 2 (two) times daily. 1440 mL 0   Calcium  Carbonate-Vitamin D  600-400 MG-UNIT tablet Take 1 tablet by mouth daily.     cholecalciferol  (VITAMIN D ) 1000 UNITS tablet Take 1,000 Units by mouth daily.     clopidogrel  (PLAVIX ) 75 MG tablet TAKE 1 TABLET BY MOUTH DAILY 90 tablet 2   diltiazem  (CARDIZEM  CD) 120 MG 24 hr capsule Take 1 capsule (120 mg total) by mouth daily. 90 capsule 3   dorzolamide  (TRUSOPT ) 2 % ophthalmic solution Place 1 drop into both eyes 2 (two) times daily.      ezetimibe  (ZETIA ) 10 MG tablet TAKE 1 TABLET BY MOUTH DAILY 90 tablet 2   ipratropium-albuterol  (DUONEB) 0.5-2.5 (3) MG/3ML SOLN TAKE BY NEBULIZATION EVERY 4 HOURS AS NEEDED 360 mL 1   latanoprost  (XALATAN ) 0.005 % ophthalmic solution Place 1 drop into both eyes at bedtime.      magnesium  oxide (MAG-OX) 400 MG tablet Take 400 mg by mouth daily.     Multiple Vitamins-Minerals (PRESERVISION AREDS 2+MULTI VIT) CAPS Take 2 capsules by mouth daily.     nitroGLYCERIN  (NITROSTAT ) 0.4 MG SL tablet Place 1 tablet (0.4 mg total) under the tongue every 5 (five) minutes as needed (for chest pain or shortness of breath). 25 tablet 3   potassium chloride  (KLOR-CON ) 10 MEQ tablet TAKE 2 TABLETS BY MOUTH DAILY. TAKE EXTRA 2 TABLETS WHEN TAKING EXTRA LASIX . 200 tablet 2   pregabalin  (LYRICA ) 50 MG capsule TAKE 1 CAPSULE BY MOUTH 2 TIMES DAILY 60 capsule 5   rosuvastatin  (CRESTOR ) 20 MG tablet TAKE 1 TABLET BY MOUTH DAILY 90 tablet 3   torsemide  (DEMADEX ) 20 MG tablet Take 1 tablet (20 mg total) by mouth daily. You may  take additional dose (20 mg tab) for 3 pound gain overnight or 5 pound gain in 1 week. 135 tablet 3   vitamin C  (ASCORBIC ACID ) 500 MG tablet Take 500 mg by mouth daily.     vitamin E  100 UNIT capsule Take 200 Units by mouth daily.     warfarin (COUMADIN ) 2.5 MG tablet TAKE 1 & 1/2 TABLETS DAILY EXCEPT TAKE ONE TABLET ON MONDAYS, WEDNESDAYS, AND FRIDAYS OR AS DIRECTED BY COUMADIN  CLINIC 40 tablet 3   No current facility-administered medications for this visit.    Allergies  Allergen Reactions   Shellfish Allergy Anaphylaxis    Other reaction(s): Hallucination   Azithromycin  Other (See Comments)    Extreme burning sensation at IV site    Tamiflu [Oseltamivir Phosphate] Other (See Comments)    Reaction:  Hallucinations  Albuterol  Palpitations    Heart racing.    Albuterol  And Levalbuterol  Palpitations      Social History   Socioeconomic History   Marital status: Married    Spouse name: Not on file   Number of children: 4   Years of education: Not on file   Highest education level: 12th grade  Occupational History   Occupation: retired  Tobacco Use   Smoking status: Never   Smokeless tobacco: Never   Tobacco comments:    never   Vaping Use   Vaping status: Never Used  Substance and Sexual Activity   Alcohol use: No   Drug use: Never   Sexual activity: Not on file  Other Topics Concern   Not on file  Social History Narrative   Lives at home with Husband. Active and Independent at baseline.   Social Drivers of Corporate investment banker Strain: Low Risk  (12/29/2023)   Overall Financial Resource Strain (CARDIA)    Difficulty of Paying Living Expenses: Not hard at all  Food Insecurity: No Food Insecurity (12/30/2023)   Hunger Vital Sign    Worried About Running Out of Food in the Last Year: Never true    Ran Out of Food in the Last Year: Never true  Transportation Needs: No Transportation Needs (12/30/2023)   PRAPARE - Administrator, Civil Service  (Medical): No    Lack of Transportation (Non-Medical): No  Physical Activity: Insufficiently Active (12/29/2023)   Exercise Vital Sign    Days of Exercise per Week: 2 days    Minutes of Exercise per Session: 60 min  Stress: No Stress Concern Present (12/29/2023)   [employer] of Occupational Health - Occupational Stress Questionnaire    Feeling of Stress : Not at all  Social Connections: Moderately Isolated (12/29/2023)   Social Connection and Isolation Panel [NHANES]    Frequency of Communication with Friends and Family: More than three times a week    Frequency of Social Gatherings with Friends and Family: More than three times a week    Attends Religious Services: Never    Database administrator or Organizations: No    Attends Banker Meetings: Never    Marital Status: Married  Catering manager Violence: Not At Risk (12/30/2023)   Humiliation, Afraid, Rape, and Kick questionnaire    Fear of Current or Ex-Partner: No    Emotionally Abused: No    Physically Abused: No    Sexually Abused: No      Family History  Problem Relation Age of Onset   CAD Mother    CAD Father    Cancer Son 48       lung cancer    Vitals:   01/04/24 0931  BP: (!) 105/48  Pulse: 82  SpO2: 98%  Weight: 119 lb (54 kg)   Wt Readings from Last 3 Encounters:  01/04/24 119 lb (54 kg)  12/30/23 115 lb (52.2 kg)  12/16/23 130 lb 8.2 oz (59.2 kg)   Lab Results  Component Value Date   CREATININE 1.11 (H) 12/23/2023   CREATININE 0.87 12/22/2023   CREATININE 0.89 12/21/2023    PHYSICAL EXAM:  General: Well appearing. No resp difficulty HEENT: normal Neck: supple, no JVD Cor: Irregular rhythm, normal rate. No rubs, gallops or murmurs Lungs: clear Abdomen: soft, nontender, nondistended. Extremities: no cyanosis, clubbing, rash, edema Neuro: alert & oriented X 3. Moves all 4 extremities w/o difficulty. Affect pleasant   ECG: not done  ASSESSMENT & PLAN:  1: Chronic heart  failure with preserved ejection fraction- - suspect due to mitral stenosis  - NYHA class III - euvolemic today - weighing daily; reminded to call for overnight weight gain of > 2 pounds or weekly weight gain of > 5 pounds - not adding salt to her foods - Echo 09/03/23: EF 55-60%, normal RV, moderate LAE, mild/ mod MR, mod/ severe Molly - continue potassium 20meq daily - continue torsemide  20mg  daily and additional 20mg  PRN - BP limits MRA - BNP 12/15/23 was 393.5  2: HTN- - BP 105/48 - saw PCP Shann Darnel) 12/24  - BMP 12/23/23 reviewed: sodium 133, potassium 4.3, creatinine 1.11 and GFR 47  3: Paroxysmal atrial fibrillation- - DCCV 03/17 - saw cardiology ([place]) 02/25 - continue cardizem  & warfarin  4: COPD- - using nebulizers PRN - saw pulmonology Auston Left) 03/25 - has oxygen  at home but doesn't wear it   Return in 2 months, sooner if needed.   Charlette Console, FNP 01/04/24

## 2024-01-04 NOTE — Progress Notes (Signed)
 [facility] - HEART FAILURE CLINIC - PHARMACIST COUNSELING NOTE  Molly Hodges is a 89 y.o. year old female who presents to the HF clinic for a follow up appointment due to a recent hospitalization. They have a past medical history significant for asthma/COPD complicated by chronic hypoxic respiratory failure on 2 L with exertion, severe mitral stenosis, HFpEF, atrial fibrillation on warfarin, hypertension, hyperlipidemia, hypothyroidism, CAD s/p PCI to proximal LAD (2022), carotid artery disease s/p right carotid stenting hospitalized from 4/27 to 12/14/23 with acute on chronic respiratory failure secondary to asthma and CHF exacerbation. This is their first HF appointment. ECHO on 09/03/2023 showed an EF of 55-60%. Narrowing of their mitral valve has worsened.   Current Guideline-Directed Medical Therapy (GDMT)  ACE/ARB/ARNI: N/A Beta Blocker: NA Aldosterone Antagonist: NA Diuretic: Torsemide  20 mg daily -- Potassium 10 mEq daily  SGLT2i: NA  Vital Signs and Trends  Recent Trends BP Readings from Last 3 Encounters:  12/23/23 (!) 113/45  12/14/23 (!) 106/41  11/03/23 122/72   Wt Readings from Last 3 Encounters:  12/30/23 115 lb (52.2 kg)  12/16/23 130 lb 8.2 oz (59.2 kg)  12/14/23 119 lb 11.4 oz (54.3 kg)   Today's visit HR 82 BP 105/48 Weight (pounds) 119.1 lbs, at home 115 lbs, steady   Do you check your weight daily? [x] Yes [] No  Do you check you blood pressure daily [] Yes [x] No   Cardiac Imaging  ECHO: Date 09/03/2023, EF 55-60%,   Changes Since Last Visit   Hospitalizations/ED visits (last 6 months):  -- Date 12/15/2023, CC COPD -- Date 4/79/2025, CC Hypoxic respiratory failure  Medication changes: NA  Pertinent Labs  Lab Results  Component Value Date   NA 133 (L) 12/23/2023   CL 96 (L) 12/23/2023   K 4.3 12/23/2023   CO2 28 12/23/2023   BUN 47 (H) 12/23/2023   CREATININE 1.11 (H) 12/23/2023   GFRNONAA 47 (L) 12/23/2023   CALCIUM  7.7  (L) 12/23/2023   PHOS 4.0 12/12/2022   ALBUMIN 4.1 08/03/2023   GLUCOSE 287 (H) 12/23/2023   Lab Results  Component Value Date   LDLCALC 47 08/07/2021   Lab Results  Component Value Date   HGBA1C 6.9 (A) 12/12/2022    ASSESSMENT  Reason for today's visit   To assess current guideline directed medical therapy. This is a first time visit. Addition of GDMT is limited by hypotension. Has a sheet for exercises and has home therapy. They report lightheadedness at times but this has been improving since being in the hospital and trying to build strength.   Guideline-Directed Medical Therapy Evaluation  ACEi/ARB/ARNi Patient is not on therapy because they have no been started   Beta-Blocker  Patient is not on therapy because it is not indicated at this time, they are on amiodarone  for atrial fibrillation   MRA Patient is not on therapy because they have not been started  SGLT-2 Patient is not on therapy because therapy have not been started Both Jardiance and Farxiga would be $0 copay for 30 or 90 day   Loop  Increase dose today  [] Yes [x] No  Heart Failure Symptoms & Volume Status   Dyspnea on exertion, when get up and move around [x] Yes [] No  Fatigue,  [] Yes [] No  Lower extremity edema [] Yes [x] No  Weight gain (> 5 lbs) [] Yes [x] No  Cough or wheezing  [] Yes [x] No  Abdominal bloating/Discomfort [] Yes [x] No  Early satiety or poor appetite [] Yes [x] No  Dizziness of lightheadedness, head  feels like it will fall off shoulder, orthostatic hypotension  [x] Yes [] No  # Pillows used at night: 1 pillow   Adherence Assessment  Do you ever forget to take your medication? [] Yes [x] No  Do you ever skip doses due to side effects? [] Yes [x] No  Do you have trouble affording your medicines? [] Yes [x] No  Are you ever unable to pick up your medication due to transportation difficulties? [] Yes [x] No  Do you ever stop taking your medications because you don't believe they are  helping? [] Yes [x] No   Adherence strategy: Daughter takes care of their medications. Table at night, eye drops and brething  -- Use two separate pill boxes  Barriers to obtaining medications: None identified  Did you take your medications before your appointment today: [x] Yes [] No  Preventative Care   Parameter Most Recent Result Goal/Status Current Medications  LDL 47 < 70 - 55 mg/dL At goal Statin: Crestor  20 mg daily Zetia : 10 mg daily   A1c 6.9  7 - 8 %    BP Control 105/48 < 130/80 soft Additional: NA  ECHO 55-60% 6 - 12 months Last 09/03/2023   PLAN  Medication Interventions:  -- Hypotension limits the addition of GDMT -- Continue regimen per NP Preventative Care Follow-up:  -- No interventions at this time  Lab or imaging orders:  -- Last ECHO January 2025   Time spent: 15 minutes  Thank you for allowing pharmacy to participate in this patient's care.   Aala Ransom K Abbiegail Landgren, Pharm.D. Pharmacy Resident 01/04/2024 7:22 AM

## 2024-01-05 ENCOUNTER — Ambulatory Visit (INDEPENDENT_AMBULATORY_CARE_PROVIDER_SITE_OTHER): Admitting: Family Medicine

## 2024-01-05 ENCOUNTER — Encounter (INDEPENDENT_AMBULATORY_CARE_PROVIDER_SITE_OTHER): Payer: Self-pay

## 2024-01-05 ENCOUNTER — Encounter: Payer: Self-pay | Admitting: Family Medicine

## 2024-01-05 VITALS — BP 99/58 | HR 67 | Resp 16 | Ht 65.0 in | Wt 115.0 lb

## 2024-01-05 DIAGNOSIS — I050 Rheumatic mitral stenosis: Secondary | ICD-10-CM | POA: Diagnosis not present

## 2024-01-05 DIAGNOSIS — I493 Ventricular premature depolarization: Secondary | ICD-10-CM | POA: Diagnosis not present

## 2024-01-05 DIAGNOSIS — R531 Weakness: Secondary | ICD-10-CM

## 2024-01-05 DIAGNOSIS — R0600 Dyspnea, unspecified: Secondary | ICD-10-CM | POA: Diagnosis not present

## 2024-01-05 DIAGNOSIS — I110 Hypertensive heart disease with heart failure: Secondary | ICD-10-CM | POA: Diagnosis not present

## 2024-01-05 DIAGNOSIS — I2510 Atherosclerotic heart disease of native coronary artery without angina pectoris: Secondary | ICD-10-CM | POA: Diagnosis not present

## 2024-01-05 DIAGNOSIS — I5033 Acute on chronic diastolic (congestive) heart failure: Secondary | ICD-10-CM | POA: Diagnosis not present

## 2024-01-05 DIAGNOSIS — J441 Chronic obstructive pulmonary disease with (acute) exacerbation: Secondary | ICD-10-CM | POA: Diagnosis not present

## 2024-01-05 DIAGNOSIS — I4819 Other persistent atrial fibrillation: Secondary | ICD-10-CM | POA: Diagnosis not present

## 2024-01-05 DIAGNOSIS — R5383 Other fatigue: Secondary | ICD-10-CM | POA: Diagnosis not present

## 2024-01-05 DIAGNOSIS — I480 Paroxysmal atrial fibrillation: Secondary | ICD-10-CM

## 2024-01-05 DIAGNOSIS — J9621 Acute and chronic respiratory failure with hypoxia: Secondary | ICD-10-CM | POA: Diagnosis not present

## 2024-01-05 DIAGNOSIS — I5020 Unspecified systolic (congestive) heart failure: Secondary | ICD-10-CM | POA: Diagnosis not present

## 2024-01-05 DIAGNOSIS — G629 Polyneuropathy, unspecified: Secondary | ICD-10-CM | POA: Diagnosis not present

## 2024-01-05 DIAGNOSIS — I739 Peripheral vascular disease, unspecified: Secondary | ICD-10-CM | POA: Diagnosis not present

## 2024-01-05 DIAGNOSIS — I700 Atherosclerosis of aorta: Secondary | ICD-10-CM | POA: Diagnosis not present

## 2024-01-05 MED ORDER — LEVALBUTEROL TARTRATE 45 MCG/ACT IN AERO: 2.0000 | INHALATION_SPRAY | Freq: Four times a day (QID) | RESPIRATORY_TRACT | 3 refills | Status: DC | PRN

## 2024-01-05 NOTE — Progress Notes (Signed)
 Established patient visit   Patient: Molly Hodges   DOB: July 29, 1932   89 y.o. Female  MRN: [id number] Visit Date: 01/05/2024  Today's healthcare provider: Jeralene Mom, MD   Chief Complaint  Patient presents with   Hospitalization Follow-up    No changes from COPD. Decline Dtap/Tdap vaccine   Subjective    HPI  Presents for follow up 9 day hospitalization for asthma/COPD flare, discharged on 12/23/23. Reports she is still a little short of breath and very fatigued. Is using Brovana  and Pulmicort  nebulizer consistently. She finished prednisone  about a week ago. Unclear if she using Duonebs, but she states she does not tolerate albuterol  because it makes her heart race and request a different type of rescue inhaler. She last saw her Pulmonologist Dr. Auston Left, in March and recommended follow up after 3 months and does not appear to have follow up appointment scheduled. She had follow up at CHF clinic yesterday and has appointment with Dr. Gollan in July.   She denies any fevers, chills, sweats, or chest pains.   She continues to have difficulty with activities of daily living due to extreme fatigue and dyspnea on exertion. She is need of home health referral and renew of custodial care approval for assistance with home meal preparation and housekeeping.   Medications: Outpatient Medications Prior to Visit  Medication Sig   arformoterol  (BROVANA ) 15 MCG/2ML NEBU Take 2 mLs (15 mcg total) by nebulization 2 (two) times daily.   budesonide  (PULMICORT ) 0.5 MG/2ML nebulizer solution Take 2 mLs (0.5 mg total) by nebulization 2 (two) times daily.   cholecalciferol  (VITAMIN D ) 1000 UNITS tablet Take 1,000 Units by mouth daily.   clopidogrel  (PLAVIX ) 75 MG tablet TAKE 1 TABLET BY MOUTH DAILY   diltiazem  (CARDIZEM  CD) 120 MG 24 hr capsule TAKE 1 CAPSULE BY MOUTH EVERY DAY   dorzolamide  (TRUSOPT ) 2 % ophthalmic solution Place 1 drop into both eyes 2 (two) times daily.    ezetimibe  (ZETIA )  10 MG tablet TAKE 1 TABLET BY MOUTH DAILY   ipratropium-albuterol  (DUONEB) 0.5-2.5 (3) MG/3ML SOLN TAKE BY NEBULIZATION EVERY 4 HOURS AS NEEDED   latanoprost  (XALATAN ) 0.005 % ophthalmic solution Place 1 drop into both eyes at bedtime.    nitroGLYCERIN  (NITROSTAT ) 0.4 MG SL tablet Place 1 tablet (0.4 mg total) under the tongue every 5 (five) minutes as needed (for chest pain or shortness of breath).   potassium chloride  (KLOR-CON ) 10 MEQ tablet TAKE 2 TABLETS BY MOUTH DAILY. TAKE EXTRA 2 TABLETS WHEN TAKING EXTRA LASIX .   pregabalin  (LYRICA ) 50 MG capsule TAKE 1 CAPSULE BY MOUTH 2 TIMES DAILY   rosuvastatin  (CRESTOR ) 20 MG tablet TAKE 1 TABLET BY MOUTH DAILY   torsemide  (DEMADEX ) 20 MG tablet Take 1 tablet (20 mg total) by mouth daily. You may take additional dose (20 mg tab) for 3 pound gain overnight or 5 pound gain in 1 week.   vitamin C  (ASCORBIC ACID ) 500 MG tablet Take 500 mg by mouth daily.   warfarin (COUMADIN ) 2.5 MG tablet TAKE 1 & 1/2 TABLETS DAILY EXCEPT TAKE ONE TABLET ON MONDAYS, WEDNESDAYS, AND FRIDAYS OR AS DIRECTED BY COUMADIN  CLINIC   No facility-administered medications prior to visit.    Review of Systems     Objective    BP (!) 99/58 (BP Location: Left Arm, Patient Position: Sitting, Cuff Size: Normal)   Pulse 67   Resp 16   Ht 5\' 5"  (1.651 m)   Wt 115 lb (  52.2 kg)   SpO2 100%   BMI 19.14 kg/m    Physical Exam   General: Appearance:    Frail female in no acute distress  Eyes:    PERRL, conjunctiva/corneas clear, EOM's intact       Lungs:     Mild diffuse expiratory wheezes,  respirations unlabored  Heart:    Normal heart rate. Irregularly irregular rhythm.  2/6 blowing, holosystolic murmur at apex 2/6 low pitched, rumbling, decrescendo, diastolic murmur at apex   MS:   All extremities are intact.    Neurologic:   Awake, alert, oriented x 3. No apparent focal neurological defect.         Assessment & Plan     1. COPD with acute exacerbation (HCC)  (Primary) Improving from respiratory standpoint, finished prednisone . Using Brovana  and Pulmocort nebs consistently. Unclear is using duonebs, but states she does not tolerated albuterol . Has no other rescue inhalers. Will try Xopenex  until her follow up with Dr. Auston Left which she is due for in June.   2. Asthma, chronic obstructive, with acute exacerbation (HCC)  - levalbuterol  (XOPENEX  HFA) 45 MCG/ACT inhaler; Inhale 2 puffs into the lungs every 6 (six) hours as needed for wheezing.  Dispense: 1 each; Refill: 3  Schedule follow up with Dr. Kasa within the next month.   3. Dyspnea, unspecified type  - Renal function panel - TSH - CBC with Differential/Platelet  4. Generalized weakness Needs home health and assistance with meal preparation and housekeeping.   5. Other fatigue  - Renal function panel - TSH - CBC with Differential/Platelet  6. Acute on chronic heart failure with preserved ejection fraction (HFpEF) (HCC) Continue current management pre CHF clinic and follow up appointment with her cardiologist in July.   7. Paroxysmal atrial fibrillation (HCC) Rate controlled, on warfarin managed by cardiology. .    Addressed extensive list of chronic and acute medical problems today requiring 44 minutes reviewing her medical record, counseling patient regarding her conditions and coordination of care.          Jeralene Mom, MD  [facility] [phone] (phone) [phone] (fax)  [facility]

## 2024-01-05 NOTE — Patient Instructions (Addendum)
 Please review the attached list of medications and notify my office if there are any errors.   Please contact Dr. Landa Pine office with [facility] at [phone] to schedule a follow up appointment within the next month.   The Duoneb breathing treatments have albuterol . Do not uses the Xopenex  inhaler if you have used the Duonebs within the last 2 hours.

## 2024-01-06 ENCOUNTER — Ambulatory Visit: Attending: Cardiovascular Disease

## 2024-01-06 ENCOUNTER — Ambulatory Visit: Payer: Self-pay | Admitting: Family Medicine

## 2024-01-06 DIAGNOSIS — I480 Paroxysmal atrial fibrillation: Secondary | ICD-10-CM

## 2024-01-06 DIAGNOSIS — Z5181 Encounter for therapeutic drug level monitoring: Secondary | ICD-10-CM

## 2024-01-06 DIAGNOSIS — I050 Rheumatic mitral stenosis: Secondary | ICD-10-CM | POA: Diagnosis not present

## 2024-01-06 LAB — RENAL FUNCTION PANEL
Albumin: 3.5 g/dL — ABNORMAL LOW (ref 3.6–4.6)
BUN/Creatinine Ratio: 15 (ref 12–28)
BUN: 11 mg/dL (ref 10–36)
CO2: 23 mmol/L (ref 20–29)
Calcium: 8.1 mg/dL — ABNORMAL LOW (ref 8.7–10.3)
Chloride: 100 mmol/L (ref 96–106)
Creatinine, Ser: 0.74 mg/dL (ref 0.57–1.00)
Glucose: 94 mg/dL (ref 70–99)
Phosphorus: 2.8 mg/dL — ABNORMAL LOW (ref 3.0–4.3)
Potassium: 3.9 mmol/L (ref 3.5–5.2)
Sodium: 138 mmol/L (ref 134–144)
eGFR: 77 mL/min/{1.73_m2} (ref >59)

## 2024-01-06 LAB — POCT INR: INR: 5 — AB (ref 2.0–3.0)

## 2024-01-06 LAB — CBC WITH DIFFERENTIAL/PLATELET
Basophils Absolute: 0.1 10*3/uL (ref 0.0–0.2)
Basos: 1 %
EOS (ABSOLUTE): 0.4 10*3/uL (ref 0.0–0.4)
Eos: 5 %
Hematocrit: 38.1 % (ref 34.0–46.6)
Hemoglobin: 11.3 g/dL (ref 11.1–15.9)
Immature Grans (Abs): 0 10*3/uL (ref 0.0–0.1)
Immature Granulocytes: 0 %
Lymphocytes Absolute: 1 10*3/uL (ref 0.7–3.1)
Lymphs: 10 %
MCH: 22.4 pg — ABNORMAL LOW (ref 26.6–33.0)
MCHC: 29.7 g/dL — ABNORMAL LOW (ref 31.5–35.7)
MCV: 76 fL — ABNORMAL LOW (ref 79–97)
Monocytes Absolute: 0.8 10*3/uL (ref 0.1–0.9)
Monocytes: 9 %
Neutrophils Absolute: 7.2 10*3/uL — ABNORMAL HIGH (ref 1.4–7.0)
Neutrophils: 75 %
Platelets: 174 10*3/uL (ref 150–450)
RBC: 5.04 x10E6/uL (ref 3.77–5.28)
RDW: 17.4 % — ABNORMAL HIGH (ref 11.7–15.4)
WBC: 9.5 10*3/uL (ref 3.4–10.8)

## 2024-01-06 LAB — TSH: TSH: 2.65 u[IU]/mL (ref 0.450–4.500)

## 2024-01-06 NOTE — Patient Instructions (Signed)
 Description   Skip today and tomorrow's dosage of Warfarin, then start taking 1 tablet every day, EXCEPT 1.5 TABLETS ON TUESDAYS, THURSDAYS, AND SUNDAYS.  EAT 2 HELPINGS OF GREENS PER WEEK.   Recheck INR in 2 weeks;  [id number]

## 2024-01-07 ENCOUNTER — Telehealth: Payer: Self-pay

## 2024-01-07 ENCOUNTER — Other Ambulatory Visit: Payer: Self-pay

## 2024-01-07 NOTE — Patient Outreach (Signed)
 Complex Care Management   Visit Note  01/07/2024  Name:  Molly Hodges MRN: [id number] DOB: 01-04-1932  Situation: Referral received for Complex Care Management related to Heart Failure, COPD, and asthma I obtained verbal consent from Patient.  Visit completed with Pedro Bourgeois  on the phone  Background:   Past Medical History:  Diagnosis Date   Aortic atherosclerosis (HCC)    a. 05/2021 TEE: GrIII atheroma plaque involving the asc, transverse, and desc Ao.   Asthma    CAD (coronary artery disease)    a. 04/2021 Cath: LM nl, LAD 85p/m, D1 80, RI nl, LCX nl, RCA nl; b. 07/2021 PCI: pLAD (2.75x26 Onyx Frontier DES), D1 (2.5x22 Onyx Frontier DES).   Carotid artery disease (HCC)    a. s/p R carotid stenting (9mm x 7mm x 4cm long Exact stent); b. 06/2021 U/S: RICA 40-59%, LICA 40-59%.   CHF (congestive heart failure) (HCC)    Community acquired pneumonia    Essential hypertension    Glaucoma    Hyperlipidemia    Mitral regurgitation    a. TTE 08/2015: EF 60-65%, normal wall motion, mild MR, mildly dilated left atrium measuring 40 mm, RVSF normal, PASP normal; b. 05/2021 TEE: Moderate MR.   Mitral stenosis    a. 05/2021 L/RHC: Sev MS w/ mean grad 13-23mmHg and MV area of 0.5-06.cm^2; b. 05/2021 TEE: EF 55-60%, no rwma, nl RV fxn, mod MR, mod MS (MV area by P1/2t: 1.61 cm^2 w/ mean grad of ).   Peripheral neuropathy    Persistent atrial fibrillation (HCC) 09/17/2015   a. s/p DCCV 11/15/2015; b. CHADS2VASc => 4 (HTN, age x 2, female)--> warfarin; c. 06/2021 recurrent afib-->amio added.   Squamous cell carcinoma of skin 12/18/2021   R dorsum hand, EDC   Squamous cell carcinoma of skin 12/31/2021   R hand dorsum, recurrent - excised 02/04/2022   Squamous cell carcinoma of skin 12/31/2021   L forearm - ED&C   Squamous cell carcinoma of skin 06/24/2022   R thumb webspace with wart virus - ED&C   Squamous cell carcinoma of skin 10/20/2022   L lat knee - tx with ED&C     Assessment: Patient Reported Symptoms:  Cognitive Cognitive Status: Able to follow simple commands, Alert and oriented to person, place, and time, Normal speech and language skills Cognitive/Intellectual Conditions Management [RPT]: None reported or documented in medical history or problem list      Neurological Neurological Review of Symptoms: Not assessed    HEENT HEENT Symptoms Reported: Not assessed      Cardiovascular Cardiovascular Symptoms Reported: No symptoms reported (Patient reports "a little bit" of lightheadedness but not much) Does patient have uncontrolled Hypertension?: No Cardiovascular Conditions: Hypertension, Dysrhythmia, High blood cholesterol, Heart failure, Valvular disease Cardiovascular Management Strategies: Medication therapy, Weight management, Diet modification Do You Have a Working Readable Scale?: Yes Weight: 114 lb (51.7 kg) Cardiovascular Comment: Reviewed visit with HF clinic 01/04/24  Respiratory Respiratory Symptoms Reported: Shortness of breath Additional Respiratory Details: Patient reports "I'm a little bit short winded" although this is chronic for her. She reports shortness of breath is unchanged. Noted that Albuterol  was switched to Xopenex  by PCP 01/05/24. Patient reports she has not had a chance to pick up new inhaler yet. Respiratory Conditions: Asthma, COPD Respiratory Comment: Patient reports she has oxygen  if needed. Note that patient was advised by PCP to pulmonology office to schedule follow-up visit. Reminded to schedule appointment and ensured she has office phone number.  Endocrine  Patient reports the following symptoms related to hypoglycemia or hyperglycemia : Not assessed    Gastrointestinal Gastrointestinal Symptoms Reported: No symptoms reported Additional Gastrointestinal Details: Patient notes that her labs showed she is slightly malnourished. She continues to report a good appetite and steady weight.   Nutrition Risk Screen  (CP): No indicators present  Genitourinary Genitourinary Symptoms Reported: Not assessed    Integumentary Integumentary Symptoms Reported: Not assessed    Musculoskeletal Musculoskelatal Symptoms Reviewed: Weakness Additional Musculoskeletal Details: Patient reports that her strength is not coming back as quickly as she would like, but she is feeling stronger. Musculoskeletal Management Strategies: Medical device Musculoskeletal Comment: PT from [facility] once a week (Tuesdays) Falls in the past year?: No Number of falls in past year: 1 or less Was there an injury with Fall?: No Fall Risk Category Calculator: 0 Patient Fall Risk Level: Low Fall Risk Patient at Risk for Falls Due to: Impaired vision Fall risk Follow up: Falls evaluation completed, Education provided  Psychosocial Psychosocial Symptoms Reported: Not assessed            12/30/2023   10:23 AM  Depression screen PHQ 2/9  Decreased Interest 0  Down, Depressed, Hopeless 0  PHQ - 2 Score 0    There were no vitals filed for this visit.  Medications Reviewed Today     Reviewed by Valaria Garland, RN (Registered Nurse) on 01/07/24 at 1111  Med List Status: <None>   Medication Order Taking? Sig Documenting Provider Last Dose Status Informant  arformoterol  (BROVANA ) 15 MCG/2ML NEBU [id number] No Take 2 mLs (15 mcg total) by nebulization 2 (two) times daily. Kasa, Kurian, MD Taking Active Self  budesonide  (PULMICORT ) 0.5 MG/2ML nebulizer solution [id number] No Take 2 mLs (0.5 mg total) by nebulization 2 (two) times daily. Kasa, Kurian, MD Taking Active Self  cholecalciferol  (VITAMIN D ) 1000 UNITS tablet [id number] No Take 1,000 Units by mouth daily. [provider] Taking Active Self  clopidogrel  (PLAVIX ) 75 MG tablet [id number] No TAKE 1 TABLET BY MOUTH DAILY Gollan, Timothy J, MD Taking Active Self  diltiazem  (CARDIZEM  CD) 120 MG 24 hr capsule [id number] No TAKE 1 CAPSULE BY MOUTH EVERY DAY Gollan, Timothy J,  MD Taking Active   dorzolamide  (TRUSOPT ) 2 % ophthalmic solution [id number] No Place 1 drop into both eyes 2 (two) times daily.  [provider] Taking Active Self  ezetimibe  (ZETIA ) 10 MG tablet [id number] No TAKE 1 TABLET BY MOUTH DAILY Gollan, Timothy J, MD Taking Active Self  ipratropium-albuterol  (DUONEB) 0.5-2.5 (3) MG/3ML SOLN [id number] No TAKE BY NEBULIZATION EVERY 4 HOURS AS NEEDED Lamon Pillow, MD Taking Active Self           Med Note Meryle Acre, NANCY J   Wed Dec 16, 2023  2:28 AM) Pt prefers not to use because of side effects (heart racing).  latanoprost  (XALATAN ) 0.005 % ophthalmic solution [id number] No Place 1 drop into both eyes at bedtime.  [provider] Taking Active Self  levalbuterol  (XOPENEX  HFA) 45 MCG/ACT inhaler [id number]  Inhale 2 puffs into the lungs every 6 (six) hours as needed for wheezing. Lamon Pillow, MD  Active   nitroGLYCERIN  (NITROSTAT ) 0.4 MG SL tablet [id number] No Place 1 tablet (0.4 mg total) under the tongue every 5 (five) minutes as needed (for chest pain or shortness of breath). Morey Ar, NP Taking Active Self  potassium chloride  (KLOR-CON ) 10 MEQ tablet [id number] No TAKE 2 TABLETS BY MOUTH DAILY. TAKE EXTRA 2 TABLETS WHEN  TAKING EXTRA LASIX . Gollan, Timothy J, MD Taking Active Self  pregabalin  (LYRICA ) 50 MG capsule [id number] No TAKE 1 CAPSULE BY MOUTH 2 TIMES DAILY Lamon Pillow, MD Taking Active Self  rosuvastatin  (CRESTOR ) 20 MG tablet [id number] No TAKE 1 TABLET BY MOUTH DAILY Gollan, Timothy J, MD Taking Active Self  torsemide  (DEMADEX ) 20 MG tablet [id number] No Take 1 tablet (20 mg total) by mouth daily. You may take additional dose (20 mg tab) for 3 pound gain overnight or 5 pound gain in 1 week. Morey Ar, NP Taking Active Self  vitamin C  (ASCORBIC ACID ) 500 MG tablet [id number] No Take 500 mg by mouth daily. [provider] Taking Active Self  warfarin (COUMADIN ) 2.5 MG tablet [id number] No TAKE 1  & 1/2 TABLETS DAILY EXCEPT TAKE ONE TABLET ON MONDAYS, WEDNESDAYS, AND FRIDAYS OR AS DIRECTED BY COUMADIN  CLINIC Gollan, Timothy J, MD Taking Active Self            Recommendation:   Specialty provider follow-up call pulmonology to schedule follow-up visit  Follow Up Plan:   Telephone follow up appointment date/time:  01/21/24 at 10 AM  Theodora Fish, RN MSN [place]  [facility] RN Care Manager Direct Dial: [phone]  Fax: [phone]

## 2024-01-07 NOTE — Telephone Encounter (Signed)
 Copied from CRM [id number]. Topic: Referral - Question >> Jan 07, 2024  2:24 PM Carlatta H wrote: Reason for CRM: Referral request sent to [employer] needs clarification for referral//Please call back before 5 today [phone] or  [phone] Pending Auth [phone]

## 2024-01-07 NOTE — Patient Instructions (Signed)
 Visit Information  Thank you for taking time to visit with me today. Please don't hesitate to contact me if I can be of assistance to you before our next scheduled appointment.  Your next care management appointment is by telephone on 01/21/24 at 10 AM  Please be sure to call Dr. Landa Pine office, [facility], to schedule a follow-up visit. They can be reached at [phone].   Please call the care guide team at [phone] if you need to cancel, schedule, or reschedule an appointment.   Please call the Suicide and Crisis Lifeline: 988 call 1-800-273-TALK (toll free, 24 hour hotline) if you are experiencing a Mental Health or Behavioral Health Crisis or need someone to talk to.  Theodora Fish, RN MSN [place]  [facility] RN Care Manager Direct Dial: [phone]  Fax: [phone]

## 2024-01-08 NOTE — Telephone Encounter (Signed)
 What do they need clarification about?

## 2024-01-12 ENCOUNTER — Telehealth: Payer: Self-pay

## 2024-01-12 DIAGNOSIS — Z7901 Long term (current) use of anticoagulants: Secondary | ICD-10-CM | POA: Diagnosis not present

## 2024-01-12 DIAGNOSIS — J441 Chronic obstructive pulmonary disease with (acute) exacerbation: Secondary | ICD-10-CM | POA: Diagnosis not present

## 2024-01-12 DIAGNOSIS — I480 Paroxysmal atrial fibrillation: Secondary | ICD-10-CM | POA: Diagnosis not present

## 2024-01-12 DIAGNOSIS — R5383 Other fatigue: Secondary | ICD-10-CM | POA: Diagnosis not present

## 2024-01-12 DIAGNOSIS — Z9981 Dependence on supplemental oxygen: Secondary | ICD-10-CM | POA: Diagnosis not present

## 2024-01-12 DIAGNOSIS — H538 Other visual disturbances: Secondary | ICD-10-CM | POA: Diagnosis not present

## 2024-01-12 DIAGNOSIS — Z7902 Long term (current) use of antithrombotics/antiplatelets: Secondary | ICD-10-CM | POA: Diagnosis not present

## 2024-01-12 DIAGNOSIS — R531 Weakness: Secondary | ICD-10-CM | POA: Diagnosis not present

## 2024-01-12 DIAGNOSIS — J4489 Other specified chronic obstructive pulmonary disease: Secondary | ICD-10-CM | POA: Diagnosis not present

## 2024-01-12 DIAGNOSIS — I5033 Acute on chronic diastolic (congestive) heart failure: Secondary | ICD-10-CM | POA: Diagnosis not present

## 2024-01-12 DIAGNOSIS — Z7951 Long term (current) use of inhaled steroids: Secondary | ICD-10-CM | POA: Diagnosis not present

## 2024-01-12 DIAGNOSIS — Z5982 Transportation insecurity: Secondary | ICD-10-CM | POA: Diagnosis not present

## 2024-01-12 DIAGNOSIS — Z9181 History of falling: Secondary | ICD-10-CM | POA: Diagnosis not present

## 2024-01-12 NOTE — Telephone Encounter (Signed)
 The referral went straight to the insurance company not to who is going to be providing the care/which company, location? The insurance said we can go ahead and cancel the one they have and place a new referral to the correct place, usually it goes to the company that is going to be doing the services and they send the information to the insurance. Insurance needs to know by the end of this week on what to do with the referral.

## 2024-01-12 NOTE — Telephone Encounter (Signed)
 That's fine

## 2024-01-12 NOTE — Telephone Encounter (Signed)
 Copied from CRM [id number]. Topic: Clinical - Home Health Verbal Orders >> Jan 12, 2024  4:14 PM Crispin Dolphin wrote: Caller/Agency: [facility] Callback Number: [phone] - ok to leave msg - secure line  Service Requested: Physical Therapy - also need order nursing evaluation  Frequency: 1 time per week for 7 weeks, also need order nursing evaluation  Any new concerns about the patient? Yes - just discharged from hospital - COPD

## 2024-01-13 ENCOUNTER — Telehealth: Payer: Self-pay | Admitting: Family

## 2024-01-13 ENCOUNTER — Telehealth: Payer: Self-pay | Admitting: Family Medicine

## 2024-01-13 NOTE — Progress Notes (Unsigned)
 Advanced Heart Failure Clinic Note    PCP: Lamon Pillow, MD (last seen 12/24) Cardiologist: Belva Boyden, MD / Morey Ar, NP (last seen 02/25)  Chief Complaint: shortness of breath   HPI:  Molly Hodges is a 89 y/o female with a history of asthma, carotid stenosis, CAD, hyperlipidemia, PAD, HTN, COPD, glaucoma, hypothyroidism, mitral stenosis, atrial fibrillation and chronic heart failure.   Diagnosed with A-fib January 2017 in the setting of pneumonia and UTI. Echo at that time showed normal LV function with moderately dilated left atrium. She was anticoagulated with Eliquis  and underwent cardioversion.   Underwent R/LHC in September 2022 which demonstrated severe mitral stenosis and moderate MR as well as severe single-vessel CAD in the LAD. Referred to CT surgery and underwent TEE which demonstrated moderate mitral stenosis with moderate MR. Given these findings she was scheduled to undergo PCI of DES on 08/02/2021. She tolerated the procedure well and was discharged home.   Admitted 12/22 by EMS. Initial labs showed BNP 555, troponin 1505>> 1284, D-dimer 12.17. EKG showed rate controlled A-fib without acute changes. CTA was negative for PE however lower lobe mucous plugging was noted with peribronchial thickening and patchy groundglass density concerning for atelectasis versus mild edema versus combination of the 2. She underwent LHC which showed patent stents (further details above). Her A-fib has been managed with amiodarone  and diltiazem  with Coumadin  for stroke prophylaxis.   Seen in [facility] 06/23.  Echo 09/03/23: EF 55-60%, normal RV, moderate LAE, mild/ mod MR, mod/ severe Molly  Admitted 12/13/23 with SOB X 3 days. BO 157/94. Placed on oxygen  @ 2L due to being tachypneic. WBC elevated 15.2. CXR with possible right pleural effusion. Started on lasix . + rhinovirus which exacerbated her asthma.   Admitted 12/15/23 due to SOB. Woke up unable to breath without improvement w/  inhalers. IV steroids given along with bronchodilators. CTA chest negative for PE / pneumonia.   She presents today for a post hospital visit with a chief complaint of shortness of breath. Has associated fatigue, lightheaded at times (improving), palpitations. Denies chest pain, abdominal distention, pedal edema. Sleeping well on 1 pillow. Has oxygen  at home but hasn't been wearing it.    ROS: All systems negative except what is listed in HPI, PMH and Problem List   Past Medical History:  Diagnosis Date   Aortic atherosclerosis (HCC)    a. 05/2021 TEE: GrIII atheroma plaque involving the asc, transverse, and desc Ao.   Asthma    CAD (coronary artery disease)    a. 04/2021 Cath: LM nl, LAD 85p/m, D1 80, RI nl, LCX nl, RCA nl; b. 07/2021 PCI: pLAD (2.75x26 Onyx Frontier DES), D1 (2.5x22 Onyx Frontier DES).   Carotid artery disease (HCC)    a. s/p R carotid stenting (9mm x 7mm x 4cm long Exact stent); b. 06/2021 U/S: RICA 40-59%, LICA 40-59%.   CHF (congestive heart failure) (HCC)    Community acquired pneumonia    Essential hypertension    Glaucoma    Hyperlipidemia    Mitral regurgitation    a. TTE 08/2015: EF 60-65%, normal wall motion, mild MR, mildly dilated left atrium measuring 40 mm, RVSF normal, PASP normal; b. 05/2021 TEE: Moderate MR.   Mitral stenosis    a. 05/2021 L/RHC: Sev Molly w/ mean grad 13-20mmHg and MV area of 0.5-06.cm^2; b. 05/2021 TEE: EF 55-60%, no rwma, nl RV fxn, mod MR, mod Molly (MV area by P1/2t: 1.61 cm^2 w/ mean grad of ).   Peripheral  neuropathy    Persistent atrial fibrillation (HCC) 09/17/2015   a. s/p DCCV 11/15/2015; b. CHADS2VASc => 4 (HTN, age x 2, female)--> warfarin; c. 06/2021 recurrent afib-->amio added.   Squamous cell carcinoma of skin 12/18/2021   R dorsum hand, EDC   Squamous cell carcinoma of skin 12/31/2021   R hand dorsum, recurrent - excised 02/04/2022   Squamous cell carcinoma of skin 12/31/2021   L forearm - ED&C   Squamous cell  carcinoma of skin 06/24/2022   R thumb webspace with wart virus - ED&C   Squamous cell carcinoma of skin 10/20/2022   L lat knee - tx with ED&C    Current Outpatient Medications  Medication Sig Dispense Refill   arformoterol  (BROVANA ) 15 MCG/2ML NEBU Take 2 mLs (15 mcg total) by nebulization 2 (two) times daily. 120 mL 5   budesonide  (PULMICORT ) 0.5 MG/2ML nebulizer solution Take 2 mLs (0.5 mg total) by nebulization 2 (two) times daily. 1440 mL 0   cholecalciferol  (VITAMIN D ) 1000 UNITS tablet Take 1,000 Units by mouth daily.     clopidogrel  (PLAVIX ) 75 MG tablet TAKE 1 TABLET BY MOUTH DAILY 90 tablet 2   diltiazem  (CARDIZEM  CD) 120 MG 24 hr capsule TAKE 1 CAPSULE BY MOUTH EVERY DAY 90 capsule 3   dorzolamide  (TRUSOPT ) 2 % ophthalmic solution Place 1 drop into both eyes 2 (two) times daily.      ezetimibe  (ZETIA ) 10 MG tablet TAKE 1 TABLET BY MOUTH DAILY 90 tablet 2   ipratropium-albuterol  (DUONEB) 0.5-2.5 (3) MG/3ML SOLN TAKE BY NEBULIZATION EVERY 4 HOURS AS NEEDED 360 mL 1   latanoprost  (XALATAN ) 0.005 % ophthalmic solution Place 1 drop into both eyes at bedtime.      levalbuterol  (XOPENEX  HFA) 45 MCG/ACT inhaler Inhale 2 puffs into the lungs every 6 (six) hours as needed for wheezing. 1 each 3   nitroGLYCERIN  (NITROSTAT ) 0.4 MG SL tablet Place 1 tablet (0.4 mg total) under the tongue every 5 (five) minutes as needed (for chest pain or shortness of breath). 25 tablet 3   potassium chloride  (KLOR-CON ) 10 MEQ tablet TAKE 2 TABLETS BY MOUTH DAILY. TAKE EXTRA 2 TABLETS WHEN TAKING EXTRA LASIX . 200 tablet 2   pregabalin  (LYRICA ) 50 MG capsule TAKE 1 CAPSULE BY MOUTH 2 TIMES DAILY 60 capsule 5   rosuvastatin  (CRESTOR ) 20 MG tablet TAKE 1 TABLET BY MOUTH DAILY 90 tablet 3   torsemide  (DEMADEX ) 20 MG tablet Take 1 tablet (20 mg total) by mouth daily. You may take additional dose (20 mg tab) for 3 pound gain overnight or 5 pound gain in 1 week. 135 tablet 3   vitamin C  (ASCORBIC ACID ) 500 MG  tablet Take 500 mg by mouth daily.     warfarin (COUMADIN ) 2.5 MG tablet TAKE 1 & 1/2 TABLETS DAILY EXCEPT TAKE ONE TABLET ON MONDAYS, WEDNESDAYS, AND FRIDAYS OR AS DIRECTED BY COUMADIN  CLINIC 40 tablet 3   No current facility-administered medications for this visit.    Allergies  Allergen Reactions   Shellfish Allergy Anaphylaxis    Other reaction(s): Hallucination   Azithromycin  Other (See Comments)    Extreme burning sensation at IV site    Tamiflu [Oseltamivir Phosphate] Other (See Comments)    Reaction:  Hallucinations     Albuterol  Palpitations    Heart racing.    Albuterol  And Levalbuterol  Palpitations      Social History   Socioeconomic History   Marital status: Married    Spouse name: Not on file  Number of children: 4   Years of education: Not on file   Highest education level: 12th grade  Occupational History   Occupation: retired  Tobacco Use   Smoking status: Never   Smokeless tobacco: Never   Tobacco comments:    never   Vaping Use   Vaping status: Never Used  Substance and Sexual Activity   Alcohol use: No   Drug use: Never   Sexual activity: Not on file  Other Topics Concern   Not on file  Social History Narrative   Lives at home with Husband. Active and Independent at baseline.   Social Drivers of Corporate investment banker Strain: Low Risk  (12/29/2023)   Overall Financial Resource Strain (CARDIA)    Difficulty of Paying Living Expenses: Not hard at all  Food Insecurity: No Food Insecurity (12/30/2023)   Hunger Vital Sign    Worried About Running Out of Food in the Last Year: Never true    Ran Out of Food in the Last Year: Never true  Transportation Needs: No Transportation Needs (12/30/2023)   PRAPARE - Administrator, Civil Service (Medical): No    Lack of Transportation (Non-Medical): No  Physical Activity: Insufficiently Active (12/29/2023)   Exercise Vital Sign    Days of Exercise per Week: 2 days    Minutes of Exercise  per Session: 60 min  Stress: No Stress Concern Present (12/29/2023)   [employer] of Occupational Health - Occupational Stress Questionnaire    Feeling of Stress : Not at all  Social Connections: Moderately Isolated (12/29/2023)   Social Connection and Isolation Panel [NHANES]    Frequency of Communication with Friends and Family: More than three times a week    Frequency of Social Gatherings with Friends and Family: More than three times a week    Attends Religious Services: Never    Database administrator or Organizations: No    Attends Banker Meetings: Never    Marital Status: Married  Catering manager Violence: Not At Risk (12/30/2023)   Humiliation, Afraid, Rape, and Kick questionnaire    Fear of Current or Ex-Partner: No    Emotionally Abused: No    Physically Abused: No    Sexually Abused: No      Family History  Problem Relation Age of Onset   CAD Mother    CAD Father    Cancer Son 90       lung cancer    There were no vitals filed for this visit.  Wt Readings from Last 3 Encounters:  01/07/24 114 lb (51.7 kg)  01/05/24 115 lb (52.2 kg)  01/04/24 119 lb (54 kg)   Lab Results  Component Value Date   CREATININE 0.74 01/05/2024   CREATININE 1.11 (H) 12/23/2023   CREATININE 0.87 12/22/2023    PHYSICAL EXAM:  General: Well appearing. No resp difficulty HEENT: normal Neck: supple, no JVD Cor: Irregular rhythm, normal rate. No rubs, gallops or murmurs Lungs: clear Abdomen: soft, nontender, nondistended. Extremities: no cyanosis, clubbing, rash, edema Neuro: alert & oriented X 3. Moves all 4 extremities w/o difficulty. Affect pleasant   ECG: not done   ASSESSMENT & PLAN:  1: Chronic heart failure with preserved ejection fraction- - suspect due to mitral stenosis  - NYHA class III - euvolemic today - weighing daily; reminded to call for overnight weight gain of > 2 pounds or weekly weight gain of > 5 pounds - not adding salt to her  foods - Echo 09/03/23: EF 55-60%, normal RV, moderate LAE, mild/ mod MR, mod/ severe Molly - continue potassium 20meq daily - continue torsemide  20mg  daily and additional 20mg  PRN - BP limits MRA - BNP 12/15/23 was 393.5  2: HTN- - BP 105/48 - saw PCP Shann Darnel) 12/24  - BMP 12/23/23 reviewed: sodium 133, potassium 4.3, creatinine 1.11 and GFR 47  3: Paroxysmal atrial fibrillation- - DCCV 03/17 - saw cardiology ([place]) 02/25 - continue cardizem  & warfarin  4: COPD- - using nebulizers PRN - saw pulmonology [facility] Left) 03/25 - has oxygen  at home but doesn't wear it   Return in 2 months, sooner if needed.   Charlette Console, FNP 01/13/24

## 2024-01-13 NOTE — Telephone Encounter (Signed)
 Molly Hodges(PT) from [facility] wanted PCP to be aware that he observed patient being in respiratory distress, had low BP and declined 911 call. [name] asked patient how long this been going on and patient replied" 2 to 3 days". Family is aware also.

## 2024-01-13 NOTE — Telephone Encounter (Signed)
 Verbal orders provided on secure line.

## 2024-01-13 NOTE — Telephone Encounter (Signed)
 Called to confirm/remind patient of their appointment at the Advanced [facility] Failure Clinic on 01/14/24.   Appointment:   [x] Confirmed  [] Left mess   [] No answer/No voice mail  [] VM Full/unable to leave message  [] Phone not in service  Patient reminded to bring all medications and/or complete list.  Confirmed patient has transportation. Gave directions, instructed to utilize valet parking.

## 2024-01-14 ENCOUNTER — Observation Stay
Admission: EM | Admit: 2024-01-14 | Discharge: 2024-01-17 | Disposition: A | Discharge status: Home Health | Attending: Family Medicine | Admitting: Family Medicine

## 2024-01-14 ENCOUNTER — Ambulatory Visit (HOSPITAL_BASED_OUTPATIENT_CLINIC_OR_DEPARTMENT_OTHER): Admitting: Family

## 2024-01-14 ENCOUNTER — Emergency Department

## 2024-01-14 ENCOUNTER — Other Ambulatory Visit: Payer: Self-pay

## 2024-01-14 ENCOUNTER — Encounter: Payer: Self-pay | Admitting: Family

## 2024-01-14 VITALS — BP 104/49 | HR 96 | Wt 116.2 lb

## 2024-01-14 DIAGNOSIS — Z79899 Other long term (current) drug therapy: Secondary | ICD-10-CM | POA: Insufficient documentation

## 2024-01-14 DIAGNOSIS — I110 Hypertensive heart disease with heart failure: Secondary | ICD-10-CM | POA: Diagnosis not present

## 2024-01-14 DIAGNOSIS — E785 Hyperlipidemia, unspecified: Secondary | ICD-10-CM | POA: Diagnosis not present

## 2024-01-14 DIAGNOSIS — I5033 Acute on chronic diastolic (congestive) heart failure: Secondary | ICD-10-CM | POA: Diagnosis not present

## 2024-01-14 DIAGNOSIS — Z1152 Encounter for screening for COVID-19: Secondary | ICD-10-CM | POA: Diagnosis not present

## 2024-01-14 DIAGNOSIS — I2510 Atherosclerotic heart disease of native coronary artery without angina pectoris: Secondary | ICD-10-CM | POA: Diagnosis not present

## 2024-01-14 DIAGNOSIS — Z85828 Personal history of other malignant neoplasm of skin: Secondary | ICD-10-CM | POA: Diagnosis not present

## 2024-01-14 DIAGNOSIS — I480 Paroxysmal atrial fibrillation: Secondary | ICD-10-CM | POA: Insufficient documentation

## 2024-01-14 DIAGNOSIS — I517 Cardiomegaly: Secondary | ICD-10-CM | POA: Diagnosis not present

## 2024-01-14 DIAGNOSIS — I5032 Chronic diastolic (congestive) heart failure: Secondary | ICD-10-CM | POA: Insufficient documentation

## 2024-01-14 DIAGNOSIS — I10 Essential (primary) hypertension: Secondary | ICD-10-CM | POA: Diagnosis not present

## 2024-01-14 DIAGNOSIS — J441 Chronic obstructive pulmonary disease with (acute) exacerbation: Secondary | ICD-10-CM | POA: Diagnosis not present

## 2024-01-14 DIAGNOSIS — Z7902 Long term (current) use of antithrombotics/antiplatelets: Secondary | ICD-10-CM | POA: Diagnosis not present

## 2024-01-14 DIAGNOSIS — J4489 Other specified chronic obstructive pulmonary disease: Secondary | ICD-10-CM | POA: Insufficient documentation

## 2024-01-14 DIAGNOSIS — I739 Peripheral vascular disease, unspecified: Secondary | ICD-10-CM | POA: Insufficient documentation

## 2024-01-14 DIAGNOSIS — Z7901 Long term (current) use of anticoagulants: Secondary | ICD-10-CM | POA: Insufficient documentation

## 2024-01-14 DIAGNOSIS — R0602 Shortness of breath: Secondary | ICD-10-CM | POA: Diagnosis not present

## 2024-01-14 DIAGNOSIS — Z66 Do not resuscitate: Secondary | ICD-10-CM | POA: Diagnosis not present

## 2024-01-14 DIAGNOSIS — Z9981 Dependence on supplemental oxygen: Secondary | ICD-10-CM | POA: Insufficient documentation

## 2024-01-14 DIAGNOSIS — Z955 Presence of coronary angioplasty implant and graft: Secondary | ICD-10-CM | POA: Insufficient documentation

## 2024-01-14 DIAGNOSIS — J984 Other disorders of lung: Secondary | ICD-10-CM | POA: Diagnosis not present

## 2024-01-14 DIAGNOSIS — R590 Localized enlarged lymph nodes: Secondary | ICD-10-CM | POA: Diagnosis not present

## 2024-01-14 DIAGNOSIS — E039 Hypothyroidism, unspecified: Secondary | ICD-10-CM | POA: Diagnosis not present

## 2024-01-14 DIAGNOSIS — D509 Iron deficiency anemia, unspecified: Secondary | ICD-10-CM | POA: Insufficient documentation

## 2024-01-14 DIAGNOSIS — Z8249 Family history of ischemic heart disease and other diseases of the circulatory system: Secondary | ICD-10-CM | POA: Insufficient documentation

## 2024-01-14 DIAGNOSIS — I700 Atherosclerosis of aorta: Secondary | ICD-10-CM | POA: Diagnosis not present

## 2024-01-14 DIAGNOSIS — R918 Other nonspecific abnormal finding of lung field: Secondary | ICD-10-CM | POA: Diagnosis not present

## 2024-01-14 DIAGNOSIS — G629 Polyneuropathy, unspecified: Secondary | ICD-10-CM | POA: Diagnosis not present

## 2024-01-14 DIAGNOSIS — I482 Chronic atrial fibrillation, unspecified: Secondary | ICD-10-CM | POA: Insufficient documentation

## 2024-01-14 LAB — CBC
HCT: 38.5 % (ref 36.0–46.0)
Hemoglobin: 11.5 g/dL — ABNORMAL LOW (ref 12.0–15.0)
MCH: 21.9 pg — ABNORMAL LOW (ref 26.0–34.0)
MCHC: 29.9 g/dL — ABNORMAL LOW (ref 30.0–36.0)
MCV: 73.3 fL — ABNORMAL LOW (ref 80.0–100.0)
Platelets: 360 10*3/uL (ref 150–400)
RBC: 5.25 MIL/uL — ABNORMAL HIGH (ref 3.87–5.11)
RDW: 18.6 % — ABNORMAL HIGH (ref 11.5–15.5)
WBC: 10.9 10*3/uL — ABNORMAL HIGH (ref 4.0–10.5)
nRBC: 0 % (ref 0.0–0.2)

## 2024-01-14 LAB — BASIC METABOLIC PANEL WITH GFR
Anion gap: 12 (ref 5–15)
BUN: 20 mg/dL (ref 8–23)
CO2: 27 mmol/L (ref 22–32)
Calcium: 8.4 mg/dL — ABNORMAL LOW (ref 8.9–10.3)
Chloride: 100 mmol/L (ref 98–111)
Creatinine, Ser: 0.81 mg/dL (ref 0.44–1.00)
GFR, Estimated: >60 mL/min (ref >60)
Glucose, Bld: 131 mg/dL — ABNORMAL HIGH (ref 70–99)
Potassium: 4.1 mmol/L (ref 3.5–5.1)
Sodium: 139 mmol/L (ref 135–145)

## 2024-01-14 LAB — BRAIN NATRIURETIC PEPTIDE: B Natriuretic Peptide: 488.3 pg/mL — ABNORMAL HIGH (ref 0.0–100.0)

## 2024-01-14 LAB — TROPONIN I (HIGH SENSITIVITY): Troponin I (High Sensitivity): 16 ng/L (ref <18)

## 2024-01-14 LAB — PROTIME-INR
INR: 2.3 — ABNORMAL HIGH (ref 0.8–1.2)
Prothrombin Time: 25.8 s — ABNORMAL HIGH (ref 11.4–15.2)

## 2024-01-14 MED ORDER — PREDNISONE 20 MG PO TABS
40.0000 mg | ORAL_TABLET | Freq: Every day | ORAL | Status: DC
  Administered 2024-01-16 – 2024-01-17 (×2): 40 mg via ORAL
  Filled 2024-01-14 (×2): qty 2

## 2024-01-14 MED ORDER — FUROSEMIDE 10 MG/ML IJ SOLN: 40.0000 mg | Freq: Two times a day (BID) | INTRAMUSCULAR | Status: DC

## 2024-01-14 MED ORDER — IOHEXOL 350 MG/ML SOLN
75.0000 mL | Freq: Once | INTRAVENOUS | Status: AC | PRN
  Administered 2024-01-14: 75 mL via INTRAVENOUS

## 2024-01-14 MED ORDER — WARFARIN SODIUM 2.5 MG PO TABS: 2.5000 mg | ORAL_TABLET | ORAL | Status: DC

## 2024-01-14 MED ORDER — METHYLPREDNISOLONE SODIUM SUCC 125 MG IJ SOLR
125.0000 mg | Freq: Once | INTRAMUSCULAR | Status: AC
  Administered 2024-01-14: 125 mg via INTRAVENOUS
  Filled 2024-01-14: qty 2

## 2024-01-14 MED ORDER — LATANOPROST 0.005 % OP SOLN
1.0000 [drp] | Freq: Every day | OPHTHALMIC | Status: DC
  Administered 2024-01-15 – 2024-01-16 (×3): 1 [drp] via OPHTHALMIC
  Filled 2024-01-14: qty 2.5

## 2024-01-14 MED ORDER — VITAMIN D 25 MCG (1000 UNIT) PO TABS
1000.0000 [IU] | ORAL_TABLET | Freq: Every day | ORAL | Status: DC
  Administered 2024-01-15 – 2024-01-17 (×3): 1000 [IU] via ORAL
  Filled 2024-01-14 (×3): qty 1

## 2024-01-14 MED ORDER — ONDANSETRON HCL 4 MG/2ML IJ SOLN: 4.0000 mg | Freq: Four times a day (QID) | INTRAMUSCULAR | Status: DC | PRN

## 2024-01-14 MED ORDER — ARFORMOTEROL TARTRATE 15 MCG/2ML IN NEBU
15.0000 ug | INHALATION_SOLUTION | Freq: Two times a day (BID) | RESPIRATORY_TRACT | Status: DC
  Administered 2024-01-15 – 2024-01-17 (×5): 15 ug via RESPIRATORY_TRACT
  Filled 2024-01-14 (×6): qty 2

## 2024-01-14 MED ORDER — BUDESONIDE 0.5 MG/2ML IN SUSP
RESPIRATORY_TRACT | Status: AC
Start: 2024-01-14 — End: 2024-01-15
  Filled 2024-01-14: qty 2

## 2024-01-14 MED ORDER — ACETAMINOPHEN 325 MG PO TABS
650.0000 mg | ORAL_TABLET | Freq: Four times a day (QID) | ORAL | Status: DC | PRN
  Administered 2024-01-16: 650 mg via ORAL
  Filled 2024-01-14: qty 2

## 2024-01-14 MED ORDER — CLOPIDOGREL BISULFATE 75 MG PO TABS
75.0000 mg | ORAL_TABLET | Freq: Every day | ORAL | Status: DC
  Administered 2024-01-15 – 2024-01-16 (×2): 75 mg via ORAL
  Filled 2024-01-14 (×2): qty 1

## 2024-01-14 MED ORDER — IPRATROPIUM-ALBUTEROL 0.5-2.5 (3) MG/3ML IN SOLN
3.0000 mL | Freq: Once | RESPIRATORY_TRACT | Status: AC
  Administered 2024-01-14: 3 mL via RESPIRATORY_TRACT
  Filled 2024-01-14: qty 3

## 2024-01-14 MED ORDER — VITAMIN C 500 MG PO TABS
500.0000 mg | ORAL_TABLET | Freq: Every day | ORAL | Status: DC
  Administered 2024-01-15 – 2024-01-17 (×3): 500 mg via ORAL
  Filled 2024-01-14 (×3): qty 1

## 2024-01-14 MED ORDER — EZETIMIBE 10 MG PO TABS
10.0000 mg | ORAL_TABLET | Freq: Every day | ORAL | Status: DC
  Administered 2024-01-15 – 2024-01-17 (×3): 10 mg via ORAL
  Filled 2024-01-14 (×3): qty 1

## 2024-01-14 MED ORDER — TORSEMIDE 20 MG PO TABS: 20.0000 mg | ORAL_TABLET | Freq: Every day | ORAL | Status: DC

## 2024-01-14 MED ORDER — SODIUM CHLORIDE 0.9 % IV SOLN: INTRAVENOUS | Status: DC

## 2024-01-14 MED ORDER — METHYLPREDNISOLONE SODIUM SUCC 40 MG IJ SOLR
40.0000 mg | Freq: Two times a day (BID) | INTRAMUSCULAR | Status: AC
  Administered 2024-01-15 (×2): 40 mg via INTRAVENOUS
  Filled 2024-01-14 (×2): qty 1

## 2024-01-14 MED ORDER — ROSUVASTATIN CALCIUM 20 MG PO TABS
20.0000 mg | ORAL_TABLET | Freq: Every day | ORAL | Status: DC
  Administered 2024-01-15 – 2024-01-17 (×3): 20 mg via ORAL
  Filled 2024-01-14: qty 2
  Filled 2024-01-14: qty 1
  Filled 2024-01-14 (×2): qty 2
  Filled 2024-01-14 (×2): qty 1

## 2024-01-14 MED ORDER — MAGNESIUM HYDROXIDE 400 MG/5ML PO SUSP: 30.0000 mL | Freq: Every day | ORAL | Status: DC | PRN

## 2024-01-14 MED ORDER — BUDESONIDE 0.5 MG/2ML IN SUSP
0.5000 mg | Freq: Two times a day (BID) | RESPIRATORY_TRACT | Status: DC
  Administered 2024-01-14 – 2024-01-17 (×6): 0.5 mg via RESPIRATORY_TRACT
  Filled 2024-01-14 (×5): qty 2

## 2024-01-14 MED ORDER — SODIUM CHLORIDE 0.9 % IV SOLN
1.0000 g | INTRAVENOUS | Status: DC
  Administered 2024-01-14: 1 g via INTRAVENOUS
  Filled 2024-01-14 (×2): qty 10

## 2024-01-14 MED ORDER — PREGABALIN 50 MG PO CAPS
50.0000 mg | ORAL_CAPSULE | Freq: Two times a day (BID) | ORAL | Status: DC
  Administered 2024-01-15 – 2024-01-17 (×6): 50 mg via ORAL
  Filled 2024-01-14 (×6): qty 1

## 2024-01-14 MED ORDER — ACETAMINOPHEN 650 MG RE SUPP: 650.0000 mg | Freq: Four times a day (QID) | RECTAL | Status: DC | PRN

## 2024-01-14 MED ORDER — ONDANSETRON HCL 4 MG PO TABS: 4.0000 mg | ORAL_TABLET | Freq: Four times a day (QID) | ORAL | Status: DC | PRN

## 2024-01-14 MED ORDER — FUROSEMIDE 10 MG/ML IJ SOLN
20.0000 mg | Freq: Two times a day (BID) | INTRAMUSCULAR | Status: DC
  Administered 2024-01-14 – 2024-01-17 (×6): 20 mg via INTRAVENOUS
  Filled 2024-01-14 (×6): qty 4

## 2024-01-14 MED ORDER — TRAZODONE HCL 50 MG PO TABS
25.0000 mg | ORAL_TABLET | Freq: Every evening | ORAL | Status: DC | PRN
  Administered 2024-01-15 – 2024-01-16 (×2): 25 mg via ORAL
  Filled 2024-01-14 (×2): qty 1

## 2024-01-14 MED ORDER — DILTIAZEM HCL ER COATED BEADS 120 MG PO CP24
120.0000 mg | ORAL_CAPSULE | Freq: Every day | ORAL | Status: DC
  Administered 2024-01-15 – 2024-01-17 (×3): 120 mg via ORAL
  Filled 2024-01-14 (×3): qty 1

## 2024-01-14 MED ORDER — LEVALBUTEROL TARTRATE 45 MCG/ACT IN AERO: 2.0000 | INHALATION_SPRAY | Freq: Four times a day (QID) | RESPIRATORY_TRACT | Status: DC | PRN

## 2024-01-14 MED ORDER — NITROGLYCERIN 0.4 MG SL SUBL: 0.4000 mg | SUBLINGUAL_TABLET | SUBLINGUAL | Status: DC | PRN

## 2024-01-14 MED ORDER — ENOXAPARIN SODIUM 40 MG/0.4ML IJ SOSY: 40.0000 mg | PREFILLED_SYRINGE | INTRAMUSCULAR | Status: DC

## 2024-01-14 MED ORDER — DORZOLAMIDE HCL 2 % OP SOLN
1.0000 [drp] | Freq: Two times a day (BID) | OPHTHALMIC | Status: DC
  Administered 2024-01-15 – 2024-01-17 (×6): 1 [drp] via OPHTHALMIC
  Filled 2024-01-14: qty 10

## 2024-01-14 MED ORDER — POTASSIUM CHLORIDE CRYS ER 10 MEQ PO TBCR
20.0000 meq | EXTENDED_RELEASE_TABLET | Freq: Every day | ORAL | Status: DC
  Administered 2024-01-15 – 2024-01-17 (×3): 20 meq via ORAL
  Filled 2024-01-14 (×3): qty 2

## 2024-01-14 MED ORDER — LEVALBUTEROL HCL 0.63 MG/3ML IN NEBU: 0.6300 mg | INHALATION_SOLUTION | Freq: Four times a day (QID) | RESPIRATORY_TRACT | Status: DC | PRN

## 2024-01-14 NOTE — Telephone Encounter (Signed)
 Called [employer] for clarification. She advised some steps on how and where to refer patients with HMO insurance, sent information to sarah c. Awaiting for further details.

## 2024-01-14 NOTE — Assessment & Plan Note (Signed)
-   Will continue Coumadin  and and check INR.

## 2024-01-14 NOTE — Telephone Encounter (Signed)
 I still don't understand  what I need to do.

## 2024-01-14 NOTE — Assessment & Plan Note (Addendum)
-   The patient will be admitted to a medically monitored bed. - We will place the patient IV steroid therapy with IV Solu-Medrol  as well as nebulized bronchodilator therapy with duonebs q.i.d. and q.4 hours p.r.n.Aaron Aas - Mucolytic therapy will be provided with Mucinex  and antibiotic therapy with IV Rocephin . - O2 protocol will be followed. - Will hold off long-acting beta agonist.

## 2024-01-14 NOTE — Assessment & Plan Note (Signed)
Will continue Lyrica.

## 2024-01-14 NOTE — Telephone Encounter (Signed)
 I spoke to [employer] at [facility] to advise that Molly Hodges has already begun services for personal care services  per patient's daughter [name]

## 2024-01-14 NOTE — H&P (Addendum)
 [place]   PATIENT NAME: Molly Hodges    MR#:  [id number]  DATE OF BIRTH:  03/03/1932  DATE OF ADMISSION:  01/14/2024  PRIMARY CARE PHYSICIAN: Lamon Pillow, MD   Patient is coming from: Home  REQUESTING/REFERRING PHYSICIAN: Marylynn Soho, MD  CHIEF COMPLAINT:   Chief Complaint  Patient presents with   Shortness of Breath    HISTORY OF PRESENT ILLNESS:  Molly Hodges is a 89 y.o. Caucasian female with medical history significant for coronary artery disease, asthma, carotid stenosis, essential hypertension, dyslipidemia, and peripheral neuropathy as well as atrial fibrillation on Coumadin , who presented to the emergency room with acute onset of dyspnea which has been worsening over the last 3 days, worsening with exertion as well as dry cough without much wheezing.  The patient denied any chest pain or palpitations.  She admitted to mild paroxysmal nocturnal dyspnea and has been having dyspnea on exertion without orthopnea.  No lower extremity edema.  No fever or chills.  He uses oxygen  at home when needed has been using it over the last few nights without significant improvement.  No nausea vomiting or abdominal pain.  No dysuria, oliguria or hematuria or flank pain.  ED Course: When the patient came to the ER, vital signs were within normal.  Labs revealed unremarkable BMP.  CBC showed mild anemia.  Previous levels with microcytosis and WBCs of 10.9.  BNP was 488.3.  INR was 2.3 and PTT 25.8. EKG as reviewed by me : EKG showed likely atrial fibrillation with controlled ventricular response of 93. Imaging: 2 view chest x-ray showed the following: Few small nodular opacities noted in the right mid and lower lung zones, which may reflect changes of an infectious or inflammatory bronchiolitis or aspiration. No lobar pneumonia or pleural effusion.  The patient was given 3 DuoNebs and 125 mg of IV Solu-Medrol .  She will be admitted to a medical telemetry observation  bed for further evaluation and management. PAST MEDICAL HISTORY:   Past Medical History:  Diagnosis Date   Aortic atherosclerosis (HCC)    a. 05/2021 TEE: GrIII atheroma plaque involving the asc, transverse, and desc Ao.   Asthma    CAD (coronary artery disease)    a. 04/2021 Cath: LM nl, LAD 85p/m, D1 80, RI nl, LCX nl, RCA nl; b. 07/2021 PCI: pLAD (2.75x26 Onyx Frontier DES), D1 (2.5x22 Onyx Frontier DES).   Carotid artery disease (HCC)    a. s/p R carotid stenting (9mm x 7mm x 4cm long Exact stent); b. 06/2021 U/S: RICA 40-59%, LICA 40-59%.   CHF (congestive heart failure) (HCC)    Community acquired pneumonia    Essential hypertension    Glaucoma    Hyperlipidemia    Mitral regurgitation    a. TTE 08/2015: EF 60-65%, normal wall motion, mild MR, mildly dilated left atrium measuring 40 mm, RVSF normal, PASP normal; b. 05/2021 TEE: Moderate MR.   Mitral stenosis    a. 05/2021 L/RHC: Sev MS w/ mean grad 13-17mmHg and MV area of 0.5-06.cm^2; b. 05/2021 TEE: EF 55-60%, no rwma, nl RV fxn, mod MR, mod MS (MV area by P1/2t: 1.61 cm^2 w/ mean grad of ).   Peripheral neuropathy    Persistent atrial fibrillation (HCC) 09/17/2015   a. s/p DCCV 11/15/2015; b. CHADS2VASc => 4 (HTN, age x 2, female)--> warfarin; c. 06/2021 recurrent afib-->amio added.   Squamous cell carcinoma of skin 12/18/2021   R dorsum hand, EDC   Squamous  cell carcinoma of skin 12/31/2021   R hand dorsum, recurrent - excised 02/04/2022   Squamous cell carcinoma of skin 12/31/2021   L forearm - ED&C   Squamous cell carcinoma of skin 06/24/2022   R thumb webspace with wart virus - ED&C   Squamous cell carcinoma of skin 10/20/2022   L lat knee - tx with ED&C    PAST SURGICAL HISTORY:   Past Surgical History:  Procedure Laterality Date   ABDOMINAL HYSTERECTOMY  1987   due to heavy bleeding   APPENDECTOMY     CARDIAC CATHETERIZATION     CAROTID PTA/STENT INTERVENTION Right 08/15/2020   Procedure: CAROTID PTA/STENT  INTERVENTION;  Surgeon: Celso College, MD;  Location: [facility];  Service: Cardiovascular;  Laterality: Right;   CATARACT EXTRACTION     CORONARY IMAGING/OCT N/A 08/02/2021   Procedure: INTRAVASCULAR IMAGING/OCT;  Surgeon: Sammy Crisp, MD;  Location: [facility];  Service: Cardiovascular;  Laterality: N/A;   CORONARY STENT INTERVENTION N/A 08/02/2021   Procedure: CORONARY STENT INTERVENTION;  Surgeon: Sammy Crisp, MD;  Location: [facility];  Service: Cardiovascular;  Laterality: N/A;   ELECTROPHYSIOLOGIC STUDY N/A 11/15/2015   Procedure: CARDIOVERSION;  Surgeon: Devorah Fonder, MD;  Location: [facility];  Service: Cardiovascular;  Laterality: N/A;   LEFT HEART CATH AND CORONARY ANGIOGRAPHY N/A 08/02/2021   Procedure: LEFT HEART CATH AND CORONARY ANGIOGRAPHY;  Surgeon: Sammy Crisp, MD;  Location: [facility];  Service: Cardiovascular;  Laterality: N/A;   LEFT HEART CATH AND CORONARY ANGIOGRAPHY N/A 08/07/2021   Procedure: LEFT HEART CATH AND CORONARY ANGIOGRAPHY;  Surgeon: Wenona Hamilton, MD;  Location: [facility];  Service: Cardiovascular;  Laterality: N/A;   RIGHT/LEFT HEART CATH AND CORONARY ANGIOGRAPHY N/A 05/17/2021   Procedure: RIGHT/LEFT HEART CATH AND CORONARY ANGIOGRAPHY;  Surgeon: Sammy Crisp, MD;  Location: [facility];  Service: Cardiovascular;  Laterality: N/A;   TEE WITHOUT CARDIOVERSION N/A 06/13/2021   Procedure: TRANSESOPHAGEAL ECHOCARDIOGRAM (TEE);  Surgeon: Devorah Fonder, MD;  Location: [facility];  Service: Cardiovascular;  Laterality: N/A;    SOCIAL HISTORY:   Social History   Tobacco Use   Smoking status: Never   Smokeless tobacco: Never   Tobacco comments:    never   Substance Use Topics   Alcohol use: No    FAMILY HISTORY:   Family History  Problem Relation Age of Onset   CAD Mother    CAD Father    Cancer Son 24       lung cancer    DRUG ALLERGIES:   Allergies  Allergen  Reactions   Shellfish Allergy Anaphylaxis    Other reaction(s): Hallucination   Azithromycin  Other (See Comments)    Extreme burning sensation at IV site    Tamiflu [Oseltamivir Phosphate] Other (See Comments)    Reaction:  Hallucinations     Albuterol  Palpitations    Heart racing.    Albuterol  And Levalbuterol  Palpitations    REVIEW OF SYSTEMS:   ROS As per history of present illness. All pertinent systems were reviewed above. Constitutional, HEENT, cardiovascular, respiratory, GI, GU, musculoskeletal, neuro, psychiatric, endocrine, integumentary and hematologic systems were reviewed and are otherwise negative/unremarkable except for positive findings mentioned above in the HPI.   MEDICATIONS AT HOME:   Prior to Admission medications   Medication Sig Start Date End Date Taking? Authorizing Provider  arformoterol  (BROVANA ) 15 MCG/2ML NEBU Take 2 mLs (15 mcg total) by nebulization 2 (two) times  daily. 09/28/23   Kasa, Kurian, MD  budesonide  (PULMICORT ) 0.5 MG/2ML nebulizer solution Take 2 mLs (0.5 mg total) by nebulization 2 (two) times daily. 09/28/23 09/27/24  Kasa, Kurian, MD  cholecalciferol  (VITAMIN D ) 1000 UNITS tablet Take 1,000 Units by mouth daily.    [provider]  clopidogrel  (PLAVIX ) 75 MG tablet TAKE 1 TABLET BY MOUTH DAILY 04/13/23   Gollan, Timothy J, MD  diltiazem  (CARDIZEM  CD) 120 MG 24 hr capsule TAKE 1 CAPSULE BY MOUTH EVERY DAY 01/04/24   Gollan, Timothy J, MD  dorzolamide  (TRUSOPT ) 2 % ophthalmic solution Place 1 drop into both eyes 2 (two) times daily.     [provider]  ezetimibe  (ZETIA ) 10 MG tablet TAKE 1 TABLET BY MOUTH DAILY 06/22/23   Gollan, Timothy J, MD  ipratropium-albuterol  (DUONEB) 0.5-2.5 (3) MG/3ML SOLN TAKE BY NEBULIZATION EVERY 4 HOURS AS NEEDED 04/14/23   Lamon Pillow, MD  latanoprost  (XALATAN ) 0.005 % ophthalmic solution Place 1 drop into both eyes at bedtime.     [provider]  levalbuterol  (XOPENEX  HFA) 45  MCG/ACT inhaler Inhale 2 puffs into the lungs every 6 (six) hours as needed for wheezing. 01/05/24   Lamon Pillow, MD  nitroGLYCERIN  (NITROSTAT ) 0.4 MG SL tablet Place 1 tablet (0.4 mg total) under the tongue every 5 (five) minutes as needed (for chest pain or shortness of breath). 08/07/23 08/06/24  Morey Ar, NP  potassium chloride  (KLOR-CON ) 10 MEQ tablet TAKE 2 TABLETS BY MOUTH DAILY. TAKE EXTRA 2 TABLETS WHEN TAKING EXTRA LASIX . 07/20/23   Gollan, Timothy J, MD  pregabalin  (LYRICA ) 50 MG capsule TAKE 1 CAPSULE BY MOUTH 2 TIMES DAILY 08/04/23   Lamon Pillow, MD  rosuvastatin  (CRESTOR ) 20 MG tablet TAKE 1 TABLET BY MOUTH DAILY 09/04/23   Gollan, Timothy J, MD  torsemide  (DEMADEX ) 20 MG tablet Take 1 tablet (20 mg total) by mouth daily. You may take additional dose (20 mg tab) for 3 pound gain overnight or 5 pound gain in 1 week. 08/07/23   Morey Ar, NP  vitamin C  (ASCORBIC ACID ) 500 MG tablet Take 500 mg by mouth daily.    [provider]  warfarin (COUMADIN ) 2.5 MG tablet TAKE 1 & 1/2 TABLETS DAILY EXCEPT TAKE ONE TABLET ON MONDAYS, WEDNESDAYS, AND FRIDAYS OR AS DIRECTED BY COUMADIN  CLINIC 10/26/23   Devorah Fonder, MD      VITAL SIGNS:  Blood pressure (!) 113/59, pulse 84, temperature 97.7 F (36.5 C), resp. rate 20, height 5\' 5"  (1.651 m), weight 52.6 kg, SpO2 96%.  PHYSICAL EXAMINATION:  Physical Exam  GENERAL:  89 y.o.-year-old Caucasian female patient lying in the bed with no acute distress.  EYES: Pupils equal, round, reactive to light and accommodation. No scleral icterus. Extraocular muscles intact.  HEENT: Head atraumatic, normocephalic. Oropharynx and nasopharynx clear.  NECK:  Supple, no jugular venous distention. No thyroid  enlargement, no tenderness.  LUNGS: Diffuse expiratory wheezes with tight expiratory airflow and harsh vesicular breathing with decreased bibasilar breath sounds and mild rales..  No use of accessory muscles of  respiration.  CARDIOVASCULAR: Irregularly irregular rhythm, S1, S2 normal. No murmurs, rubs, or gallops.  ABDOMEN: Soft, nondistended, nontender. Bowel sounds present. No organomegaly or mass.  EXTREMITIES: No pedal edema, cyanosis, or clubbing.  NEUROLOGIC: Cranial nerves II through XII are intact. Muscle strength 5/5 in all extremities. Sensation intact. Gait not checked.  PSYCHIATRIC: The patient is alert and oriented x 3.  Normal affect and good eye contact.  SKIN: No obvious rash, lesion, or ulcer.   LABORATORY PANEL:   CBC Recent Labs  Lab 01/14/24 1339  WBC 10.9*  HGB 11.5*  HCT 38.5  PLT 360   ------------------------------------------------------------------------------------------------------------------  Chemistries  Recent Labs  Lab 01/14/24 1339  NA 139  K 4.1  CL 100  CO2 27  GLUCOSE 131*  BUN 20  CREATININE 0.81  CALCIUM  8.4*   ------------------------------------------------------------------------------------------------------------------  Cardiac Enzymes No results for input(s): "TROPONINI" in the last 168 hours. ------------------------------------------------------------------------------------------------------------------  RADIOLOGY:  CT Angio Chest PE W and/or Wo Contrast Result Date: 01/14/2024 CLINICAL DATA:  Shortness of breath EXAM: CT ANGIOGRAPHY CHEST WITH CONTRAST TECHNIQUE: Multidetector CT imaging of the chest was performed using the standard protocol during bolus administration of intravenous contrast. Multiplanar CT image reconstructions and MIPs were obtained to evaluate the vascular anatomy. RADIATION DOSE REDUCTION: This exam was performed according to the departmental dose-optimization program which includes automated exposure control, adjustment of the mA and/or kV according to patient size and/or use of iterative reconstruction technique. CONTRAST:  75mL OMNIPAQUE  IOHEXOL  350 MG/ML SOLN COMPARISON:  12/15/2023 FINDINGS: Cardiovascular:  Cardiomegaly. Aorta normal caliber with moderate atherosclerosis. Coronary artery disease. Stents noted in the left coronary arteries. No filling defects in the pulmonary arteries to suggest pulmonary emboli. Mediastinum/Nodes: Mediastinal adenopathy again noted, stable. No axillary or hilar adenopathy. Trachea and esophagus are unremarkable. Thyroid  unremarkable. Lungs/Pleura: Bronchial wall thickening again noted in the lower lobes. Associated ground-glass nodular airspace disease at the right lung base, slightly improved since prior study. Calcified granulomas in the lungs. No effusions or pneumothorax. Upper Abdomen: No acute findings Musculoskeletal: Chest wall soft tissues are unremarkable. No acute bony abnormality. Review of the MIP images confirms the above findings. IMPRESSION: No evidence of pulmonary embolus. Cardiomegaly, coronary artery disease. Stable mediastinal adenopathy. Chronic bronchial wall thickening and associated peripheral nodular densities at the right lung base, likely chronic small airways disease. Aortic Atherosclerosis (ICD10-I70.0). Electronically Signed   By: Janeece Mechanic M.D.   On: 01/14/2024 20:09   DG Chest 2 View Result Date: 01/14/2024 CLINICAL DATA:  shortness of breath EXAM: CHEST - 2 VIEW COMPARISON:  December 15, 2023 FINDINGS: A few small nodular opacities noted in the right mid and lower lung zones. Findings suggestive of bronchial wall thickening better visualized on the comparison CT. No focal airspace consolidation, pleural effusion, or pneumothorax. Unchanged cardiomegaly. Tortuous aorta with aortic atherosclerosis. No acute fracture or destructive lesions. Multilevel thoracic osteophytosis. Osteopenia. Right AC joint osteoarthritis. IMPRESSION: Few small nodular opacities noted in the right mid and lower lung zones, which may reflect changes of an infectious or inflammatory bronchiolitis or aspiration. No lobar pneumonia or pleural effusion. Electronically Signed    By: Rance Burrows M.D.   On: 01/14/2024 16:06      IMPRESSION AND PLAN:  Assessment and Plan: * COPD exacerbation (HCC) - The patient will be admitted to a medically monitored bed. - We will place the patient IV steroid therapy with IV Solu-Medrol  as well as nebulized bronchodilator therapy with duonebs q.i.d. and q.4 hours p.r.n.Aaron Aas - Mucolytic therapy will be provided with Mucinex  and antibiotic therapy with IV Rocephin . - O2 protocol will be followed. - Will hold off long-acting beta agonist.   Acute on chronic diastolic CHF (congestive heart failure) (HCC) - Though she does not have interstitial edema on her chest x-ray her BNP is significantly elevated. - Will continue diuresis with IV Lasix . - This could be contributing to her respiratory compromise.  Essential hypertension -  Will continue diltiazem  CD.  Dyslipidemia Will continue statin therapy and Zetia .  Coronary artery disease Will continue Plavix .  Atrial fibrillation, chronic (HCC) - Will continue Coumadin  and and check INR.  Peripheral neuropathy - Will continue Lyrica .   DVT prophylaxis: Lovenox . Advanced Care Planning:  Code Status: full code. Family Communication:  The plan of care was discussed in details with the patient (and family). I answered all questions. The patient agreed to proceed with the above mentioned plan. Further management will depend upon hospital course. Disposition Plan: Back to previous home environment Consults called: none.  All the records are reviewed and case discussed with ED provider.  Status is: Observation  I certify that at the time of admission, it is my clinical judgment that the patient will require hospital care extending less than 2 midnights.                            Dispo: The patient is from: Home              Anticipated d/c is to: Home              Patient currently is not medically stable to d/c.              Difficult to place patient: No  Virgene Griffin  M.D on 01/14/2024 at 9:55 PM  [facility]   From 7 PM-7 AM, contact night-coverage [email or web address]  CC: Primary care physician; Lamon Pillow, MD

## 2024-01-14 NOTE — ED Provider Notes (Signed)
 [facility] Provider Note    Event Date/Time   First MD Initiated Contact with Patient 01/14/24 1522     (approximate)   History   Shortness of Breath   HPI  Molly Hodges is a 89 y.o. female  who presents to the emergency department today because of concern for shortness of breath. Has been getting worse over the past 2-3 days. Worse with exertion. The patient has history of asthma/copd and does use oxygen  at home when needed. Has been using it the past few nights but cannot tell if it has given her much improvement. The patient denies any associated chest pain, no increased cough. Denies any fevers or chills.  Denies any lower extremity swelling or discomfort.     Physical Exam   Triage Vital Signs: ED Triage Vitals  Encounter Vitals Group     BP 01/14/24 1311 (!) 99/53     Systolic BP Percentile --      Diastolic BP Percentile --      Pulse Rate 01/14/24 1311 88     Resp 01/14/24 1311 (!) 22     Temp 01/14/24 1311 97.9 F (36.6 C)     Temp Source 01/14/24 1311 Oral     SpO2 01/14/24 1311 98 %     Weight 01/14/24 1309 116 lb (52.6 kg)     Height 01/14/24 1309 5\' 5"  (1.651 m)     Head Circumference --      Peak Flow --      Pain Score 01/14/24 1309 0     Pain Loc --      Pain Education --      Exclude from Growth Chart --     Most recent vital signs: Vitals:   01/14/24 1311  BP: (!) 99/53  Pulse: 88  Resp: (!) 22  Temp: 97.9 F (36.6 C)  SpO2: 98%   General: Awake, alert, oriented. CV:  Good peripheral perfusion. Irregular rhythm. Resp:  Increased work of breathing. Some expiratory wheezing.  Abd:  No distention.  Ext:  No lower extremity edema.    ED Results / Procedures / Treatments   Labs (all labs ordered are listed, but only abnormal results are displayed) Labs Reviewed  BASIC METABOLIC PANEL WITH GFR - Abnormal; Notable for the following components:      Result Value   Glucose, Bld 131 (*)    Calcium  8.4 (*)     All other components within normal limits  CBC - Abnormal; Notable for the following components:   WBC 10.9 (*)    RBC 5.25 (*)    Hemoglobin 11.5 (*)    MCV 73.3 (*)    MCH 21.9 (*)    MCHC 29.9 (*)    RDW 18.6 (*)    All other components within normal limits  BRAIN NATRIURETIC PEPTIDE - Abnormal; Notable for the following components:   B Natriuretic Peptide 488.3 (*)    All other components within normal limits  PROTIME-INR - Abnormal; Notable for the following components:   Prothrombin Time 25.8 (*)    INR 2.3 (*)    All other components within normal limits  BASIC METABOLIC PANEL WITH GFR  CBC  TROPONIN I (HIGH SENSITIVITY)     EKG  I, Marylynn Soho, attending physician, personally viewed and interpreted this EKG  EKG Time: 1322 Rate: 93 Rhythm: atrial fibrillation Axis: normal Intervals: qtc 455 QRS: narrow ST changes: no st elevation Impression: abnormal ekg   RADIOLOGY  I independently interpreted and visualized the CXR. My interpretation: No pneumonia Radiology interpretation:  IMPRESSION:  Few small nodular opacities noted in the right mid and lower lung  zones, which may reflect changes of an infectious or inflammatory  bronchiolitis or aspiration. No lobar pneumonia or pleural effusion.      PROCEDURES:  Critical Care performed: No   MEDICATIONS ORDERED IN ED: Medications - No data to display   IMPRESSION / MDM / ASSESSMENT AND PLAN / ED COURSE  I reviewed the triage vital signs and the nursing notes.                              Differential diagnosis includes, but is not limited to, COPD, pneumonia, ACS, PE  Patient's presentation is most consistent with acute presentation with potential threat to life or bodily function.   The patient is on the cardiac monitor to evaluate for evidence of arrhythmia and/or significant heart rate changes.  Patient presented to the emergency department today because of concerns for shortness of breath.   On exam patient does have slightly increased work of breathing.  Chest x-ray did raise possibility of pneumonia however patient is afebrile with no significant leukocytosis.  Did obtain a CT to further evaluate.  Additionally wanted evaluation for PE given somewhat recent hospitalization earlier this month.  CT did not show any signs of pulmonary embolism or pneumonia.  At this time I do think likely COPD.  Patient did get some relief from DuoNeb's however continues to have significant shortness of breath. Discussed with Dr. Achilles Holes with the hospitalist service who will evaluate for admission.    FINAL CLINICAL IMPRESSION(S) / ED DIAGNOSES   Final diagnoses:  COPD exacerbation (HCC)  Shortness of breath        Rx / DC Orders      Note:  This document was prepared using Dragon voice recognition software and may include unintentional dictation errors.    Marylynn Soho, MD 01/14/24 2118

## 2024-01-14 NOTE — Assessment & Plan Note (Signed)
-   Will continue Plavix.

## 2024-01-14 NOTE — Assessment & Plan Note (Addendum)
-   Though she does not have interstitial edema on her chest x-ray her BNP is significantly elevated. - Will continue diuresis with IV Lasix . - This could be contributing to her respiratory compromise.

## 2024-01-14 NOTE — Assessment & Plan Note (Signed)
-   Will continue diltiazem  CD.

## 2024-01-14 NOTE — Assessment & Plan Note (Signed)
 Will continue statin therapy and Zetia.

## 2024-01-14 NOTE — ED Triage Notes (Signed)
 Patient sent over from heart failure clinic; reports shortness of breath for 3-4 days. Patient denies leg swelling, cough and chest pain.

## 2024-01-15 ENCOUNTER — Encounter: Payer: Self-pay | Admitting: Family Medicine

## 2024-01-15 DIAGNOSIS — I2585 Chronic coronary microvascular dysfunction: Secondary | ICD-10-CM | POA: Diagnosis not present

## 2024-01-15 DIAGNOSIS — J441 Chronic obstructive pulmonary disease with (acute) exacerbation: Secondary | ICD-10-CM | POA: Diagnosis not present

## 2024-01-15 DIAGNOSIS — G629 Polyneuropathy, unspecified: Secondary | ICD-10-CM | POA: Diagnosis not present

## 2024-01-15 DIAGNOSIS — I5033 Acute on chronic diastolic (congestive) heart failure: Secondary | ICD-10-CM | POA: Diagnosis not present

## 2024-01-15 DIAGNOSIS — E785 Hyperlipidemia, unspecified: Secondary | ICD-10-CM | POA: Diagnosis not present

## 2024-01-15 DIAGNOSIS — I482 Chronic atrial fibrillation, unspecified: Secondary | ICD-10-CM | POA: Diagnosis not present

## 2024-01-15 DIAGNOSIS — I10 Essential (primary) hypertension: Secondary | ICD-10-CM | POA: Diagnosis not present

## 2024-01-15 LAB — BASIC METABOLIC PANEL WITH GFR
Anion gap: 11 (ref 5–15)
BUN: 22 mg/dL (ref 8–23)
CO2: 26 mmol/L (ref 22–32)
Calcium: 8 mg/dL — ABNORMAL LOW (ref 8.9–10.3)
Chloride: 97 mmol/L — ABNORMAL LOW (ref 98–111)
Creatinine, Ser: 0.83 mg/dL (ref 0.44–1.00)
GFR, Estimated: >60 mL/min (ref >60)
Glucose, Bld: 351 mg/dL — ABNORMAL HIGH (ref 70–99)
Potassium: 3.6 mmol/L (ref 3.5–5.1)
Sodium: 134 mmol/L — ABNORMAL LOW (ref 135–145)

## 2024-01-15 LAB — CBC
HCT: 33.4 % — ABNORMAL LOW (ref 36.0–46.0)
Hemoglobin: 10.5 g/dL — ABNORMAL LOW (ref 12.0–15.0)
MCH: 22.2 pg — ABNORMAL LOW (ref 26.0–34.0)
MCHC: 31.4 g/dL (ref 30.0–36.0)
MCV: 70.8 fL — ABNORMAL LOW (ref 80.0–100.0)
Platelets: 410 10*3/uL — ABNORMAL HIGH (ref 150–400)
RBC: 4.72 MIL/uL (ref 3.87–5.11)
RDW: 18.2 % — ABNORMAL HIGH (ref 11.5–15.5)
WBC: 8.7 10*3/uL (ref 4.0–10.5)
nRBC: 0 % (ref 0.0–0.2)

## 2024-01-15 LAB — PROTIME-INR
INR: 2.6 — ABNORMAL HIGH (ref 0.8–1.2)
Prothrombin Time: 27.7 s — ABNORMAL HIGH (ref 11.4–15.2)

## 2024-01-15 LAB — RESP PANEL BY RT-PCR (RSV, FLU A&B, COVID)  RVPGX2
Influenza A by PCR: NEGATIVE
Influenza B by PCR: NEGATIVE
Resp Syncytial Virus by PCR: NEGATIVE
SARS Coronavirus 2 by RT PCR: NEGATIVE

## 2024-01-15 LAB — RESPIRATORY PANEL BY PCR

## 2024-01-15 LAB — HEMOGLOBIN A1C
Hgb A1c MFr Bld: 8.1 % — ABNORMAL HIGH (ref 4.8–5.6)
Mean Plasma Glucose: 185.77 mg/dL

## 2024-01-15 LAB — PROCALCITONIN: Procalcitonin: <0.1 ng/mL

## 2024-01-15 MED ORDER — WARFARIN SODIUM 2.5 MG PO TABS: 3.7500 mg | ORAL_TABLET | ORAL | Status: DC

## 2024-01-15 MED ORDER — WARFARIN SODIUM 2.5 MG PO TABS
2.5000 mg | ORAL_TABLET | Freq: Once | ORAL | Status: AC
  Administered 2024-01-15: 2.5 mg via ORAL
  Filled 2024-01-15: qty 1

## 2024-01-15 MED ORDER — WARFARIN - PHARMACIST DOSING INPATIENT: Freq: Every day | Status: DC

## 2024-01-15 MED ORDER — WARFARIN SODIUM 2.5 MG PO TABS: 2.5000 mg | ORAL_TABLET | ORAL | Status: DC

## 2024-01-15 NOTE — Progress Notes (Addendum)
 PROGRESS NOTE    Molly Hodges  WUJ:[id number] DOB: March 26, 1932 DOA: 01/14/2024 PCP: Lamon Pillow, MD  Chief Complaint  Patient presents with   Shortness of Breath    Hospital Course:  This is a 89 year old female with coronary artery disease, asthma, carotid stenosis, hypertension, dyslipidemia, peripheral neuropathy, atrial fibrillation on Coumadin , who presents to the ED with acute onset dyspnea.  Patient reports that she has had dyspnea over the last 3 days, worse with exertion, associated dry cough.  She denies any chest pain or palpitations.  She also endorses worsening paroxysmal nocturnal dyspnea, denies orthopnea.  She has home oxygen  which she uses as needed and has had minimal improvement. Vital signs were within normal limits in the ED.  CBC with anemia but otherwise unremarkable.  BNP 488, INR 2.3.  CXR with small nodular opacities in the right middle and lower lobe. CTA revealed bronchial wall thickening in the lower lobes send associated groundglass nodular airspace disease of the right lung base this slightly improved from prior studies.  Also noted calcified granulomas in the lungs.  No effusions or pneumothorax.  Stable mediastinal adenopathy  Subjective: Evaluation this morning patient reports she is starting to feel better.  Still short of breath and tired but improved   Objective: Vitals:   01/14/24 2126 01/14/24 2252 01/15/24 0401 01/15/24 0813  BP: (!) 113/59  (!) 111/55 127/68  Pulse: 84  78 64  Resp: 20  20 16   Temp: 97.7 F (36.5 C)  97.7 F (36.5 C) 97.9 F (36.6 C)  TempSrc:   Oral Oral  SpO2: 96% 95% 96% 97%  Weight:      Height:       No intake or output data in the 24 hours ending 01/15/24 0820 Filed Weights   01/14/24 1309  Weight: 52.6 kg    Examination: General exam: Appears calm and comfortable, NAD  Respiratory system: No work of breathing, symmetric chest wall expansion, some crackles bilaterally, no wheeze. Cardiovascular system:  S1 & S2 heard, RRR.  Gastrointestinal system: Abdomen is nondistended, soft and nontender.  Neuro: Alert and oriented. No focal neurological deficits. Extremities: Symmetric, expected ROM Skin: No rashes, lesions Psychiatry: Demonstrates appropriate judgement and insight. Mood & affect appropriate for situation.   Assessment & Plan:  Principal Problem:   COPD exacerbation (HCC) Active Problems:   Acute on chronic diastolic CHF (congestive heart failure) (HCC)   Peripheral neuropathy   Atrial fibrillation, chronic (HCC)   Coronary artery disease   Dyslipidemia   Essential hypertension  COPD exacerbation - Continue on telemetry - Status post 125 Solu-Medrol  - Continue scheduled nebulizers - As needed Mucinex  - Currently afebrile, no leukocytosis, no evidence of acute infection on CT or chest x-ray - Procalcitonin less than 0.1, will discontinue further antibiotics.  No indication of bacterial infection at this time - COVID/flu/RSV negative.  Respiratory viral panel pending. - Continue supplemental oxygen  as needed, currently stable on room air  Acute on chronic diastolic CHF - BNP elevated - Was started on IV Lasix  - Clinically appears euvolemic - Patient reports that she holds her volume in her abdomen when congested.  No lower extremity edema.  Does not currently endorse feeling bloated. - Strict I's and O's - Echocardiogram 1/25: LVEF 55 to 60%, degenerative mitral valve with moderate regurg and severe stenosis. - Patient follows with cardiology outpatient -- PT evals  Hypertension - Continue home dose diltiazem   Dyslipidemia - On statin and Zetia   CAD - Continue home  dose Plavix   A-fib, chronic - INR at goal - Continue current dose Coumadin   Peripheral neuropathy - Continue home dose Lyrica   DVT prophylaxis: warfarin   Code Status: Limited: Do not attempt resuscitation (DNR) -DNR-LIMITED -Do Not Intubate/DNI  Disposition:  Inpatient pending resolution in  respiratory status and volume overload. Discussed care extensively with patient's daughter [name].  Consultants:    Procedures:    Antimicrobials:  Anti-infectives (From admission, onward)    Start     Dose/Rate Route Frequency Ordered Stop   01/14/24 2130  cefTRIAXone  (ROCEPHIN ) 1 g in sodium chloride  0.9 % 100 mL IVPB        1 g 200 mL/hr over 30 Minutes Intravenous Every 24 hours 01/14/24 2041 01/19/24 2129       Data Reviewed: I have personally reviewed following labs and imaging studies CBC: Recent Labs  Lab 01/14/24 1339 01/15/24 0459  WBC 10.9* 8.7  HGB 11.5* 10.5*  HCT 38.5 33.4*  MCV 73.3* 70.8*  PLT 360 410*   Basic Metabolic Panel: Recent Labs  Lab 01/14/24 1339 01/15/24 0459  NA 139 134*  K 4.1 3.6  CL 100 97*  CO2 27 26  GLUCOSE 131* 351*  BUN 20 22  CREATININE 0.81 0.83  CALCIUM  8.4* 8.0*   GFR: Estimated Creatinine Clearance: 37.4 mL/min (by C-G formula based on SCr of 0.83 mg/dL). Liver Function Tests: No results for input(s): "AST", "ALT", "ALKPHOS", "BILITOT", "PROT", "ALBUMIN" in the last 168 hours. CBG: No results for input(s): "GLUCAP" in the last 168 hours.  No results found for this or any previous visit (from the past 240 hours).   Radiology Studies: CT Angio Chest PE W and/or Wo Contrast Result Date: 01/14/2024 CLINICAL DATA:  Shortness of breath EXAM: CT ANGIOGRAPHY CHEST WITH CONTRAST TECHNIQUE: Multidetector CT imaging of the chest was performed using the standard protocol during bolus administration of intravenous contrast. Multiplanar CT image reconstructions and MIPs were obtained to evaluate the vascular anatomy. RADIATION DOSE REDUCTION: This exam was performed according to the departmental dose-optimization program which includes automated exposure control, adjustment of the mA and/or kV according to patient size and/or use of iterative reconstruction technique. CONTRAST:  75mL OMNIPAQUE  IOHEXOL  350 MG/ML SOLN COMPARISON:   12/15/2023 FINDINGS: Cardiovascular: Cardiomegaly. Aorta normal caliber with moderate atherosclerosis. Coronary artery disease. Stents noted in the left coronary arteries. No filling defects in the pulmonary arteries to suggest pulmonary emboli. Mediastinum/Nodes: Mediastinal adenopathy again noted, stable. No axillary or hilar adenopathy. Trachea and esophagus are unremarkable. Thyroid  unremarkable. Lungs/Pleura: Bronchial wall thickening again noted in the lower lobes. Associated ground-glass nodular airspace disease at the right lung base, slightly improved since prior study. Calcified granulomas in the lungs. No effusions or pneumothorax. Upper Abdomen: No acute findings Musculoskeletal: Chest wall soft tissues are unremarkable. No acute bony abnormality. Review of the MIP images confirms the above findings. IMPRESSION: No evidence of pulmonary embolus. Cardiomegaly, coronary artery disease. Stable mediastinal adenopathy. Chronic bronchial wall thickening and associated peripheral nodular densities at the right lung base, likely chronic small airways disease. Aortic Atherosclerosis (ICD10-I70.0). Electronically Signed   By: Janeece Mechanic M.D.   On: 01/14/2024 20:09   DG Chest 2 View Result Date: 01/14/2024 CLINICAL DATA:  shortness of breath EXAM: CHEST - 2 VIEW COMPARISON:  December 15, 2023 FINDINGS: A few small nodular opacities noted in the right mid and lower lung zones. Findings suggestive of bronchial wall thickening better visualized on the comparison CT. No focal airspace consolidation, pleural effusion, or pneumothorax.  Unchanged cardiomegaly. Tortuous aorta with aortic atherosclerosis. No acute fracture or destructive lesions. Multilevel thoracic osteophytosis. Osteopenia. Right AC joint osteoarthritis. IMPRESSION: Few small nodular opacities noted in the right mid and lower lung zones, which may reflect changes of an infectious or inflammatory bronchiolitis or aspiration. No lobar pneumonia or pleural  effusion. Electronically Signed   By: Rance Burrows M.D.   On: 01/14/2024 16:06    Scheduled Meds:  arformoterol   15 mcg Nebulization BID   ascorbic acid   500 mg Oral Daily   budesonide   0.5 mg Nebulization BID   cholecalciferol   1,000 Units Oral Daily   clopidogrel   75 mg Oral Daily   diltiazem   120 mg Oral Daily   dorzolamide   1 drop Both Eyes BID   ezetimibe   10 mg Oral Daily   furosemide   20 mg Intravenous BID   latanoprost   1 drop Both Eyes QHS   methylPREDNISolone  (SOLU-MEDROL ) injection  40 mg Intravenous Q12H   Followed by   Cecily Cohen ON 01/16/2024] predniSONE   40 mg Oral Q breakfast   potassium chloride   20 mEq Oral Daily   pregabalin   50 mg Oral BID   rosuvastatin   20 mg Oral Daily   warfarin  2.5 mg Oral ONCE-1600   Warfarin - Pharmacist Dosing Inpatient   Does not apply q1600   Continuous Infusions:  cefTRIAXone  (ROCEPHIN )  IV 1 g (01/14/24 2344)     LOS: 0 days  MDM: Patient is high risk for one or more organ failure.  They necessitate ongoing hospitalization for continued IV therapies and subsequent lab monitoring. Total time spent interpreting labs and vitals, reviewing the medical record, coordinating care amongst consultants and care team members, directly assessing and discussing care with the patient and/or family: 55 min  Tyion Boylen, DO [facility]  To contact the attending physician between 7A-7P please use Epic Chat. To contact the covering physician during after hours 7P-7A, please review Amion.  01/15/2024, 8:20 AM   *This document has been created with the assistance of dictation software. Please excuse typographical errors. *

## 2024-01-15 NOTE — Progress Notes (Addendum)
 Heart Failure Stewardship Pharmacy Note  PCP: Lamon Pillow, MD PCP-Cardiologist: Belva Boyden, MD  HPI: Molly Hodges is a 89 y.o. female with CAD, asthma, carotid stenosis, HTN, HLD, peripheral neuropathy, and atrial fibrillation on warfarin who presented with dyspnea on exertion and dry cough. On admission, BNP was 488.3 and HS-troponin was 16. Chest x-ray noted a few small nodular opacities noted in the right mid and lower lung zones.  Pertinent cardiac history: Echo 08/2015 showed EF 60-65%, mild MR, and moderately dilated LA. Echo 07/2020 showed EF 55-60%, mildly dilated LA, mild/moderate pericardial effusion, and mild/moderate MR. Echo 11/2020 shoed EF 55-60%, grade II diastolic dysfunction, moderately dilated LA. [facility] 04/2021 showed severe mitral stenosis and moderate MR as well as severe single-vessel CAD in the LAD. Echo 05/2021 showed EF 55-60%, moderate MR, and moderately dilated LA. LHC 08/02/2021 showed severe single-vessel CAD with PCI to D1 and ostial through mid LAD. Repeat LHC 08/07/2021 showed widely patent LAD/diagonal stent with no significant restenosis. Echo 10/2022 showed EF 60-65%, normal PASP, mild/moderate MR, mild AR. Echo 08/2023 showed EF 55-60%, normal RV, moderate LAE, mild/moderate MR, moderate/severe MS, mild/moderate AR.    Pertinent Lab Values: Creatinine  Date Value Ref Range Status  05/10/2012 0.99 0.60 - 1.30 mg/dL Final   Creatinine, Ser  Date Value Ref Range Status  01/15/2024 0.83 0.44 - 1.00 mg/dL Final   BUN  Date Value Ref Range Status  01/15/2024 22 8 - 23 mg/dL Final  14/78/2956 11 10 - 36 mg/dL Final  21/30/8657 17 7 - 18 mg/dL Final   Potassium  Date Value Ref Range Status  01/15/2024 3.6 3.5 - 5.1 mmol/L Final  05/10/2012 3.7 3.5 - 5.1 mmol/L Final   Sodium  Date Value Ref Range Status  01/15/2024 134 (L) 135 - 145 mmol/L Final  01/05/2024 138 134 - 144 mmol/L Final  05/10/2012 143 136 - 145 mmol/L Final   B Natriuretic  Peptide  Date Value Ref Range Status  01/14/2024 488.3 (H) 0.0 - 100.0 pg/mL Final    Comment:    Performed at [facility], [address] [place], [place] [address]   Magnesium   Date Value Ref Range Status  11/01/2022 2.6 (H) 1.7 - 2.4 mg/dL Final    Comment:    Performed at [facility], [address] [place], [place] [address]   Hemoglobin A1C  Date Value Ref Range Status  12/12/2022 6.9 (A) 4.0 - 5.6 % Final   Hgb A1c MFr Bld  Date Value Ref Range Status  08/06/2021 6.2 (H) 4.8 - 5.6 % Final    Comment:    (NOTE)         Prediabetes: 5.7 - 6.4         Diabetes: >6.4         Glycemic control for adults with diabetes: <7.0    TSH  Date Value Ref Range Status  01/05/2024 2.650 0.450 - 4.500 uIU/mL Final    Vital Signs: Temp:  [97.7 F (36.5 C)-98.1 F (36.7 C)] 97.9 F (36.6 C) (05/30 0813) Pulse Rate:  [64-96] 64 (05/30 0813) Cardiac Rhythm: Atrial fibrillation (05/30 0703) Resp:  [15-22] 16 (05/30 0813) BP: (99-127)/(46-68) 127/68 (05/30 0813) SpO2:  [89 %-100 %] 97 % (05/30 0813) Weight:  [52.6 kg (116 lb)-52.7 kg (116 lb 3.2 oz)] 52.6 kg (116 lb) (05/29 1309)  Intake/Output Summary (Last 24 hours) at 01/15/2024 1156 Last data filed at 01/15/2024 1044 Gross per 24 hour  Intake 230  ml  Output --  Net 230 ml    Current Heart Failure Medications:  Loop diuretic: furosemide  20 mg IV BID Beta-Blocker: none ACEI/ARB/ARNI: none MRA: none SGLT2i: none Other: none  Prior to admission Heart Failure Medications:  Loop diuretic: torsemide  20 mg daily  Beta-Blocker: none ACEI/ARB/ARNI: none MRA: none SGLT2i: none Other: Cardizem  120 mg daily   Assessment: 1. Acute on chronic diastolic heart failure (LVEF 55-60%)  , due to likely NICM. NYHA class II symptoms.  -Symptoms: Patient reports SOB has much improved. Reports no orthopnea. Reports mild dyspnea with exertion when walking around her room.  -Volume: Patient appears euvolemic. No  LEE noted.  Creatinine and BUN stable during admission. No urine output charted. No weight recorded today. Currently receiving furosemide  20 mg IV BID. Can consider transitioning to home torsemide  PO 20 mg daily tomorrow.  -Hemodynamics: BP normal. HR 60-80s.  -SGLT2i: Can consider adding outpatient. Patient would likely benefit from therapy but given her age would encourage shared decision making  -ZOX:WRUEAVWU starting spironolactone 12.5 mg daily. Creatinine stable, most recent K 3.6.  -ARNI: Can consider adding if BP allows.   Plan: 1) Medication changes recommended at this time: - Consider starting spironolactone 12.5 mg daily.  - Can consider transitioning to home torsemide  20 mg daily tomorrow.   2) Patient assistance: - $0 copays for Jardiance and Farxiga    3) Education: -To be completed prior to discharge. - Patient has been educated on current HF medications and potential additions to HF medication regimen - Patient verbalizes understanding that over the next few months, these medication doses may change and more medications may be added to optimize HF regimen - Patient has been educated on basic disease state pathophysiology and goals of therapy   Medication Assistance / Insurance Benefits Check: Does the patient have prescription insurance?    Outpatient Pharmacy: Prior to admission outpatient pharmacy: [facility]      Please do not hesitate to reach out with questions or concerns,  Bevely Brush, PharmD, CPP, BCPS Heart Failure Pharmacist  Phone - [phone] 01/15/2024 1:45 PM

## 2024-01-15 NOTE — Inpatient Diabetes Management (Signed)
 Inpatient Diabetes Program Recommendations  AACE/ADA: New Consensus Statement on Inpatient Glycemic Control  Target Ranges:  Prepandial:   less than 140 mg/dL      Peak postprandial:   less than 180 mg/dL (1-2 hours)      Critically ill patients:  140 - 180 mg/dL    Latest Reference Range & Units 01/14/24 13:39 01/15/24 04:59  Glucose 70 - 99 mg/dL 295 (H) 284 (H)    Latest Reference Range & Units 12/12/22 10:41  Hemoglobin A1C 4.0 - 5.6 % 6.9 !   Review of Glycemic Control  Diabetes history: No Outpatient Diabetes medications: NA Current orders for Inpatient glycemic control: None  Inpatient Diabetes Program Recommendations:    Insulin : Lab glucose 351 mg/dl this morning.  If steroids are continued, may want to consider ordering CBG AC&HS with Novolog  0-9 units TID with meals and Novolog  0-5 units at bedtime.  HbgA1C: A1C noted to be 6.9% on 12/12/2022.  Please consider ordering an A1C to evaluate glycemic control over the past 2-3 months.  Thanks, Beacher Limerick, RN, MSN, CDCES Diabetes Coordinator Inpatient Diabetes Program [phone] (Team Pager from 8am to 5pm)

## 2024-01-15 NOTE — Plan of Care (Signed)
  Problem: Education: Goal: Knowledge of General Education information will improve Description: Including pain rating scale, medication(s)/side effects and non-pharmacologic comfort measures Outcome: Progressing   Problem: Clinical Measurements: Goal: Ability to maintain clinical measurements within normal limits will improve Outcome: Progressing Goal: Will remain free from infection Outcome: Progressing Goal: Diagnostic test results will improve Outcome: Progressing   Problem: Activity: Goal: Risk for activity intolerance will decrease Outcome: Progressing   Problem: Nutrition: Goal: Adequate nutrition will be maintained Outcome: Progressing   Problem: Elimination: Goal: Will not experience complications related to urinary retention Outcome: Progressing   Problem: Safety: Goal: Ability to remain free from injury will improve Outcome: Progressing   Problem: Respiratory: Goal: Levels of oxygenation will improve Outcome: Progressing Goal: Ability to maintain adequate ventilation will improve Outcome: Progressing

## 2024-01-15 NOTE — Telephone Encounter (Signed)
 Copied from CRM [id number]. Topic: Referral - Status >> Jan 14, 2024  9:41 AM Molly Hodges wrote: Reason for CRM: [employer] calling back regarding the custodial care referral, not the referral that was sent to [facility]. This is of urgency as there are CMS guidelines that have to be followed and a decision is to be made by 01/19/24. She stressed that she has been calling about this for over a week now and no progress. Needs to clarify some information asap. Please follow up with Crystal at [phone].

## 2024-01-15 NOTE — Progress Notes (Signed)
 PHARMACY - ANTICOAGULATION CONSULT NOTE  Pharmacy Consult for Warfarin (PTA Med) Indication: atrial fibrillation  Allergies  Allergen Reactions   Shellfish Allergy Anaphylaxis    Other reaction(s): Hallucination   Azithromycin  Other (See Comments)    Extreme burning sensation at IV site    Tamiflu [Oseltamivir Phosphate] Other (See Comments)    Reaction:  Hallucinations     Albuterol  Palpitations    Heart racing.    Albuterol  And Levalbuterol  Palpitations    Patient Measurements: Height: 5\' 5"  (165.1 cm) Weight: 52.6 kg (116 lb) IBW/kg (Calculated) : 57 HEPARIN  DW (KG): 52.6  Vital Signs: Temp: 97.7 F (36.5 C) (05/30 0401) Temp Source: Oral (05/30 0401) BP: 111/55 (05/30 0401) Pulse Rate: 78 (05/30 0401)  Labs: Recent Labs    01/14/24 1339 01/14/24 1644 01/15/24 0459  HGB 11.5*  --  10.5*  HCT 38.5  --  33.4*  PLT 360  --  410*  LABPROT  --  25.8* 27.7*  INR  --  2.3* 2.6*  CREATININE 0.81  --  0.83  TROPONINIHS 16  --   --     Estimated Creatinine Clearance: 37.4 mL/min (by C-G formula based on SCr of 0.83 mg/dL).   Medical History: Past Medical History:  Diagnosis Date   Aortic atherosclerosis (HCC)    a. 05/2021 TEE: GrIII atheroma plaque involving the asc, transverse, and desc Ao.   Asthma    CAD (coronary artery disease)    a. 04/2021 Cath: LM nl, LAD 85p/m, D1 80, RI nl, LCX nl, RCA nl; b. 07/2021 PCI: pLAD (2.75x26 Onyx Frontier DES), D1 (2.5x22 Onyx Frontier DES).   Carotid artery disease (HCC)    a. s/p R carotid stenting (9mm x 7mm x 4cm long Exact stent); b. 06/2021 U/S: RICA 40-59%, LICA 40-59%.   CHF (congestive heart failure) (HCC)    Community acquired pneumonia    Essential hypertension    Glaucoma    Hyperlipidemia    Mitral regurgitation    a. TTE 08/2015: EF 60-65%, normal wall motion, mild MR, mildly dilated left atrium measuring 40 mm, RVSF normal, PASP normal; b. 05/2021 TEE: Moderate MR.   Mitral stenosis    a. 05/2021 L/RHC:  Sev MS w/ mean grad 13-49mmHg and MV area of 0.5-06.cm^2; b. 05/2021 TEE: EF 55-60%, no rwma, nl RV fxn, mod MR, mod MS (MV area by P1/2t: 1.61 cm^2 w/ mean grad of ).   Peripheral neuropathy    Persistent atrial fibrillation (HCC) 09/17/2015   a. s/p DCCV 11/15/2015; b. CHADS2VASc => 4 (HTN, age x 2, female)--> warfarin; c. 06/2021 recurrent afib-->amio added.   Squamous cell carcinoma of skin 12/18/2021   R dorsum hand, EDC   Squamous cell carcinoma of skin 12/31/2021   R hand dorsum, recurrent - excised 02/04/2022   Squamous cell carcinoma of skin 12/31/2021   L forearm - ED&C   Squamous cell carcinoma of skin 06/24/2022   R thumb webspace with wart virus - ED&C   Squamous cell carcinoma of skin 10/20/2022   L lat knee - tx with ED&C    Assessment: Pt is a 89 yo female with chronic hx of A fib on Warfarin presenting to ED c/o acute onset of dyspnea, worsening over the last 3 days, worsening with exertion as well as dry cough without much wheezing.  Home regimen: Previously on Warfarin 2.5 mg PO MWF, all other days 3.75 mg (has 2.5 mg tabs at home). During recent hospitalization (4/29-5/7), patient's INR was supratherapeutic  up to 4.3 and patient was given different warfarin regimen based on daily INR. This increase was contributed to increase in steroid dose. Once methylpred tapered off, INR decreased back to within goal of 2-3. After discharge, patient had elevated INR of 5 at Anti-coag visit on 5/21. Increase in INR likely due to change in diet/appetite after 9 day hospitalization. Patient was advised to adjust home regimen to Warfarin 2.5 mg daily except 3.75 mg (1.5 tablets) on Tuesday, Thursday, and Sunday. Unclear what regimen patient is currently following.   Baseline INR: 2.3 INR goal: 2-3 Last dose: unknown, patient admitted 5/29  Date INR Warfarin Dose  5/30 2.6         Goal of Therapy:  INR 2-3   Plan:  INR therapeutic this morning  Give warfarin 2.5 mg PO x 1  (home regimen based on last Anti-coag visit) Recheck INR w/ AM labs INR & CBC at least once weekly when stable INR at goal  Genoveva Kidney, PharmD, BCPS 01/15/2024 7:40 AM

## 2024-01-15 NOTE — Care Management Obs Status (Signed)
 MEDICARE OBSERVATION STATUS NOTIFICATION   Patient Details  Name: HISAYO DELOSSANTOS MRN: [id number] Date of Birth: [birth year]   Medicare Observation Status Notification Given:  No (patient did not want a copy)    Anise Kerns 01/15/2024, 12:05 PM

## 2024-01-15 NOTE — Progress Notes (Signed)
 PHARMACY - ANTICOAGULATION CONSULT NOTE  Pharmacy Consult for Warfarin (PTA Med) Indication: atrial fibrillation  Allergies  Allergen Reactions   Shellfish Allergy Anaphylaxis    Other reaction(s): Hallucination   Azithromycin  Other (See Comments)    Extreme burning sensation at IV site    Tamiflu [Oseltamivir Phosphate] Other (See Comments)    Reaction:  Hallucinations     Albuterol  Palpitations    Heart racing.    Albuterol  And Levalbuterol  Palpitations    Patient Measurements: Height: 5\' 5"  (165.1 cm) Weight: 52.6 kg (116 lb) IBW/kg (Calculated) : 57 HEPARIN  DW (KG): 52.6  Vital Signs: Temp: 97.7 F (36.5 C) (05/29 2126) Temp Source: Oral (05/29 2030) BP: 113/59 (05/29 2126) Pulse Rate: 84 (05/29 2126)  Labs: Recent Labs    01/14/24 1339 01/14/24 1644  HGB 11.5*  --   HCT 38.5  --   PLT 360  --   LABPROT  --  25.8*  INR  --  2.3*  CREATININE 0.81  --   TROPONINIHS 16  --     Estimated Creatinine Clearance: 38.3 mL/min (by C-G formula based on SCr of 0.81 mg/dL).   Medical History: Past Medical History:  Diagnosis Date   Aortic atherosclerosis (HCC)    a. 05/2021 TEE: GrIII atheroma plaque involving the asc, transverse, and desc Ao.   Asthma    CAD (coronary artery disease)    a. 04/2021 Cath: LM nl, LAD 85p/m, D1 80, RI nl, LCX nl, RCA nl; b. 07/2021 PCI: pLAD (2.75x26 Onyx Frontier DES), D1 (2.5x22 Onyx Frontier DES).   Carotid artery disease (HCC)    a. s/p R carotid stenting (9mm x 7mm x 4cm long Exact stent); b. 06/2021 U/S: RICA 40-59%, LICA 40-59%.   CHF (congestive heart failure) (HCC)    Community acquired pneumonia    Essential hypertension    Glaucoma    Hyperlipidemia    Mitral regurgitation    a. TTE 08/2015: EF 60-65%, normal wall motion, mild MR, mildly dilated left atrium measuring 40 mm, RVSF normal, PASP normal; b. 05/2021 TEE: Moderate MR.   Mitral stenosis    a. 05/2021 L/RHC: Sev MS w/ mean grad 13-75mmHg and MV area of  0.5-06.cm^2; b. 05/2021 TEE: EF 55-60%, no rwma, nl RV fxn, mod MR, mod MS (MV area by P1/2t: 1.61 cm^2 w/ mean grad of ).   Peripheral neuropathy    Persistent atrial fibrillation (HCC) 09/17/2015   a. s/p DCCV 11/15/2015; b. CHADS2VASc => 4 (HTN, age x 2, female)--> warfarin; c. 06/2021 recurrent afib-->amio added.   Squamous cell carcinoma of skin 12/18/2021   R dorsum hand, EDC   Squamous cell carcinoma of skin 12/31/2021   R hand dorsum, recurrent - excised 02/04/2022   Squamous cell carcinoma of skin 12/31/2021   L forearm - ED&C   Squamous cell carcinoma of skin 06/24/2022   R thumb webspace with wart virus - ED&C   Squamous cell carcinoma of skin 10/20/2022   L lat knee - tx with ED&C    Medications:  PTA:  WARFARIN 2.5 MG TABS:  TAKE 1 & 1/2 TABLETS DAILY EXCEPT TAKE ONE TABLET ON MONDAYS, WEDNESDAYS, AND FRIDAYS.  Last dose unknown.  Assessment: Pt is a 89 yo female with chronic hx of A fib on Warfarin presenting to ED c/o acute onset of dyspnea, worsening over the last 3 days, worsening with exertion as well as dry cough without much wheezing.  Goal of Therapy:  INR 2-3   5/29 1644  INR 2.3, therapeutic at admission  Plan:  Will reorder home med regimen at this time starting 5/30. Recheck INR w/ AM labs, Pharmacy to adjust warfarin dosing when warranted. INR & CBC at least once weekly when stable INR at goal.  [name], PharmD, [facility] 01/15/2024 1:57 AM

## 2024-01-15 NOTE — Progress Notes (Signed)
 Heart Failure Nurse Navigator Progress Note  PCP: Lamon Pillow, MD PCP-Cardiologist: Belva Boyden, MD Admission Diagnosis: COPD exacerbation [facility]) Shortness of breath Admitted from: Home  Presentation:   Molly Hodges presented with concern for shortness of breath.  Had been getting worse over the past 2-3 days. Worse with exertion.  Patient with HX: Asthma/COPD and does use oxygen  at home when needed.  Patient denies any lower extremity swelling, chest pain, no increased cough, fever or chills.  BNP 488.3. Chest x-ray:Cardiomegaly, CAD.  Chronic broncial wall thickening and associated peripheral nodular densities at the right lung base, likely chronic small airways disease, aortic atherosclerosis.  ECHO/ LVEF: 09/03/2023 EF 55-60%, normal RV, moderate LAE, mild/mod MR, Mod/Severe MS Clinical Course:  Past Medical History:  Diagnosis Date   Aortic atherosclerosis (HCC)    a. 05/2021 TEE: GrIII atheroma plaque involving the asc, transverse, and desc Ao.   Asthma    CAD (coronary artery disease)    a. 04/2021 Cath: LM nl, LAD 85p/m, D1 80, RI nl, LCX nl, RCA nl; b. 07/2021 PCI: pLAD (2.75x26 Onyx Frontier DES), D1 (2.5x22 Onyx Frontier DES).   Carotid artery disease (HCC)    a. s/p R carotid stenting (9mm x 7mm x 4cm long Exact stent); b. 06/2021 U/S: RICA 40-59%, LICA 40-59%.   CHF (congestive heart failure) (HCC)    Community acquired pneumonia    Essential hypertension    Glaucoma    Hyperlipidemia    Mitral regurgitation    a. TTE 08/2015: EF 60-65%, normal wall motion, mild MR, mildly dilated left atrium measuring 40 mm, RVSF normal, PASP normal; b. 05/2021 TEE: Moderate MR.   Mitral stenosis    a. 05/2021 L/RHC: Sev MS w/ mean grad 13-87mmHg and MV area of 0.5-06.cm^2; b. 05/2021 TEE: EF 55-60%, no rwma, nl RV fxn, mod MR, mod MS (MV area by P1/2t: 1.61 cm^2 w/ mean grad of ).   Peripheral neuropathy    Persistent atrial fibrillation (HCC) 09/17/2015   a. s/p DCCV  11/15/2015; b. CHADS2VASc => 4 (HTN, age x 2, female)--> warfarin; c. 06/2021 recurrent afib-->amio added.   Squamous cell carcinoma of skin 12/18/2021   R dorsum hand, EDC   Squamous cell carcinoma of skin 12/31/2021   R hand dorsum, recurrent - excised 02/04/2022   Squamous cell carcinoma of skin 12/31/2021   L forearm - ED&C   Squamous cell carcinoma of skin 06/24/2022   R thumb webspace with wart virus - ED&C   Squamous cell carcinoma of skin 10/20/2022   L lat knee - tx with ED&C     Social History   Socioeconomic History   Marital status: Married    Spouse name: Not on file   Number of children: 4   Years of education: Not on file   Highest education level: 12th grade  Occupational History   Occupation: retired  Tobacco Use   Smoking status: Never   Smokeless tobacco: Never   Tobacco comments:    never   Vaping Use   Vaping status: Never Used  Substance and Sexual Activity   Alcohol use: No   Drug use: Never   Sexual activity: Not on file  Other Topics Concern   Not on file  Social History Narrative   Lives at home with Husband. Active and Independent at baseline.   Social Drivers of Health   Financial Resource Strain: Low Risk  (12/29/2023)   Overall Financial Resource Strain (CARDIA)    Difficulty of  Paying Living Expenses: Not hard at all  Food Insecurity: No Food Insecurity (12/30/2023)   Hunger Vital Sign    Worried About Running Out of Food in the Last Year: Never true    Ran Out of Food in the Last Year: Never true  Transportation Needs: No Transportation Needs (12/30/2023)   PRAPARE - Administrator, Civil Service (Medical): No    Lack of Transportation (Non-Medical): No  Physical Activity: Insufficiently Active (12/29/2023)   Exercise Vital Sign    Days of Exercise per Week: 2 days    Minutes of Exercise per Session: 60 min  Stress: No Stress Concern Present (12/29/2023)   [employer] of Occupational Health - Occupational Stress  Questionnaire    Feeling of Stress : Not at all  Social Connections: Moderately Isolated (12/29/2023)   Social Connection and Isolation Panel [NHANES]    Frequency of Communication with Friends and Family: More than three times a week    Frequency of Social Gatherings with Friends and Family: More than three times a week    Attends Religious Services: Never    Database administrator or Organizations: No    Attends Engineer, structural: Never    Marital Status: Married   Water engineer and Provision:  Detailed education and instructions provided on heart failure disease management including the following:  Signs and symptoms of Heart Failure When to call the physician Importance of daily weights Low sodium diet Fluid restriction Medication management Anticipated future follow-up appointments  Patient education given on each of the above topics.  Patient acknowledges understanding via teach back method and acceptance of all instructions.  Education Materials:  "Living Better With Heart Failure" Booklet, HF zone tool, & Daily Weight Tracker Tool.  Patient has scale at home: Yes Patient has pill box at home: Yes    High Risk Criteria for Readmission and/or Poor Patient Outcomes: Heart failure hospital admissions (last 6 months): 1  No Show rate: 2% Difficult social situation: None Demonstrates medication adherence: Yes Primary Language: English Literacy level: Reading, Writing & Comprehension  Barriers of Care:   None  Considerations/Referrals:   Referral made to Heart Failure Pharmacist Stewardship: Yes Referral made to Heart Failure CSW/NCM TOC: No Hospital follow-up appointment scheduled with Shawnee Dellen [facility] on 01/25/24 @ 10:30 AM  Items for Follow-up on DC/TOC: Diet & Fluid Restrictions Daily Weights Continued Heart Failure Education   Celedonio Coil, RN, BSN [facility] Heart Failure Navigator Secure Chat Only

## 2024-01-16 DIAGNOSIS — I10 Essential (primary) hypertension: Secondary | ICD-10-CM | POA: Diagnosis not present

## 2024-01-16 DIAGNOSIS — G629 Polyneuropathy, unspecified: Secondary | ICD-10-CM | POA: Diagnosis not present

## 2024-01-16 DIAGNOSIS — E785 Hyperlipidemia, unspecified: Secondary | ICD-10-CM | POA: Diagnosis not present

## 2024-01-16 DIAGNOSIS — J441 Chronic obstructive pulmonary disease with (acute) exacerbation: Secondary | ICD-10-CM | POA: Diagnosis not present

## 2024-01-16 DIAGNOSIS — I2585 Chronic coronary microvascular dysfunction: Secondary | ICD-10-CM | POA: Diagnosis not present

## 2024-01-16 DIAGNOSIS — I482 Chronic atrial fibrillation, unspecified: Secondary | ICD-10-CM | POA: Diagnosis not present

## 2024-01-16 DIAGNOSIS — I5033 Acute on chronic diastolic (congestive) heart failure: Secondary | ICD-10-CM | POA: Diagnosis not present

## 2024-01-16 LAB — COMPREHENSIVE METABOLIC PANEL WITH GFR
ALT: 18 U/L (ref 0–44)
AST: 22 U/L (ref 15–41)
Albumin: 2.4 g/dL — ABNORMAL LOW (ref 3.5–5.0)
Alkaline Phosphatase: 57 U/L (ref 38–126)
Anion gap: 9 (ref 5–15)
BUN: 29 mg/dL — ABNORMAL HIGH (ref 8–23)
CO2: 26 mmol/L (ref 22–32)
Calcium: 8.2 mg/dL — ABNORMAL LOW (ref 8.9–10.3)
Chloride: 99 mmol/L (ref 98–111)
Creatinine, Ser: 0.85 mg/dL (ref 0.44–1.00)
GFR, Estimated: >60 mL/min (ref >60)
Glucose, Bld: 242 mg/dL — ABNORMAL HIGH (ref 70–99)
Potassium: 4 mmol/L (ref 3.5–5.1)
Sodium: 134 mmol/L — ABNORMAL LOW (ref 135–145)
Total Bilirubin: 0.5 mg/dL (ref 0.0–1.2)
Total Protein: 5.7 g/dL — ABNORMAL LOW (ref 6.5–8.1)

## 2024-01-16 LAB — CBC
HCT: 32.4 % — ABNORMAL LOW (ref 36.0–46.0)
Hemoglobin: 10.2 g/dL — ABNORMAL LOW (ref 12.0–15.0)
MCH: 22.4 pg — ABNORMAL LOW (ref 26.0–34.0)
MCHC: 31.5 g/dL (ref 30.0–36.0)
MCV: 71.2 fL — ABNORMAL LOW (ref 80.0–100.0)
Platelets: 468 10*3/uL — ABNORMAL HIGH (ref 150–400)
RBC: 4.55 MIL/uL (ref 3.87–5.11)
RDW: 18.3 % — ABNORMAL HIGH (ref 11.5–15.5)
WBC: 20 10*3/uL — ABNORMAL HIGH (ref 4.0–10.5)
nRBC: 0 % (ref 0.0–0.2)

## 2024-01-16 LAB — RETICULOCYTES
Immature Retic Fract: 18.2 % — ABNORMAL HIGH (ref 2.3–15.9)
RBC.: 4.5 MIL/uL (ref 3.87–5.11)
Retic Count, Absolute: 52.7 10*3/uL (ref 19.0–186.0)
Retic Ct Pct: 1.2 % (ref 0.4–3.1)

## 2024-01-16 LAB — IRON AND TIBC
Iron: 16 ug/dL — ABNORMAL LOW (ref 28–170)
Saturation Ratios: 7 % — ABNORMAL LOW (ref 10.4–31.8)
TIBC: 225 ug/dL — ABNORMAL LOW (ref 250–450)
UIBC: 209 ug/dL

## 2024-01-16 LAB — FOLATE: Folate: 13.3 ng/mL (ref >5.9)

## 2024-01-16 LAB — PROTIME-INR
INR: 2.9 — ABNORMAL HIGH (ref 0.8–1.2)
Prothrombin Time: 30.5 s — ABNORMAL HIGH (ref 11.4–15.2)

## 2024-01-16 LAB — FERRITIN: Ferritin: 87 ng/mL (ref 11–307)

## 2024-01-16 MED ORDER — WARFARIN SODIUM 2.5 MG PO TABS
2.5000 mg | ORAL_TABLET | Freq: Once | ORAL | Status: AC
  Administered 2024-01-16: 2.5 mg via ORAL
  Filled 2024-01-16: qty 1

## 2024-01-16 NOTE — Progress Notes (Signed)
 PHARMACY - ANTICOAGULATION CONSULT NOTE  Pharmacy Consult for Warfarin (PTA Med) Indication: atrial fibrillation  Allergies  Allergen Reactions   Shellfish Allergy Anaphylaxis    Other reaction(s): Hallucination   Azithromycin  Other (See Comments)    Extreme burning sensation at IV site    Tamiflu [Oseltamivir Phosphate] Other (See Comments)    Reaction:  Hallucinations     Albuterol  Palpitations    Heart racing.    Albuterol  And Levalbuterol  Palpitations    Patient Measurements: Height: 5\' 5"  (165.1 cm) Weight: 52.6 kg (116 lb) IBW/kg (Calculated) : 57 HEPARIN  DW (KG): 52.6  Vital Signs: Temp: 97.7 F (36.5 C) (05/31 0400) Temp Source: Oral (05/31 0400) BP: 109/54 (05/31 0400) Pulse Rate: 72 (05/31 0400)  Labs: Recent Labs    01/14/24 1339 01/14/24 1644 01/15/24 0459  HGB 11.5*  --  10.5*  HCT 38.5  --  33.4*  PLT 360  --  410*  LABPROT  --  25.8* 27.7*  INR  --  2.3* 2.6*  CREATININE 0.81  --  0.83  TROPONINIHS 16  --   --     Estimated Creatinine Clearance: 37.4 mL/min (by C-G formula based on SCr of 0.83 mg/dL).   Medical History: Past Medical History:  Diagnosis Date   Aortic atherosclerosis (HCC)    a. 05/2021 TEE: GrIII atheroma plaque involving the asc, transverse, and desc Ao.   Asthma    CAD (coronary artery disease)    a. 04/2021 Cath: LM nl, LAD 85p/m, D1 80, RI nl, LCX nl, RCA nl; b. 07/2021 PCI: pLAD (2.75x26 Onyx Frontier DES), D1 (2.5x22 Onyx Frontier DES).   Carotid artery disease (HCC)    a. s/p R carotid stenting (9mm x 7mm x 4cm long Exact stent); b. 06/2021 U/S: RICA 40-59%, LICA 40-59%.   CHF (congestive heart failure) (HCC)    Community acquired pneumonia    Essential hypertension    Glaucoma    Hyperlipidemia    Mitral regurgitation    a. TTE 08/2015: EF 60-65%, normal wall motion, mild MR, mildly dilated left atrium measuring 40 mm, RVSF normal, PASP normal; b. 05/2021 TEE: Moderate MR.   Mitral stenosis    a. 05/2021 L/RHC:  Sev MS w/ mean grad 13-76mmHg and MV area of 0.5-06.cm^2; b. 05/2021 TEE: EF 55-60%, no rwma, nl RV fxn, mod MR, mod MS (MV area by P1/2t: 1.61 cm^2 w/ mean grad of ).   Peripheral neuropathy    Persistent atrial fibrillation (HCC) 09/17/2015   a. s/p DCCV 11/15/2015; b. CHADS2VASc => 4 (HTN, age x 2, female)--> warfarin; c. 06/2021 recurrent afib-->amio added.   Squamous cell carcinoma of skin 12/18/2021   R dorsum hand, EDC   Squamous cell carcinoma of skin 12/31/2021   R hand dorsum, recurrent - excised 02/04/2022   Squamous cell carcinoma of skin 12/31/2021   L forearm - ED&C   Squamous cell carcinoma of skin 06/24/2022   R thumb webspace with wart virus - ED&C   Squamous cell carcinoma of skin 10/20/2022   L lat knee - tx with ED&C    Assessment: Pt is a 89 yo female with chronic hx of A fib on Warfarin presenting to ED c/o acute onset of dyspnea, worsening over the last 3 days, worsening with exertion as well as dry cough without much wheezing.  Home regimen: Previously on Warfarin 2.5 mg PO MWF, all other days 3.75 mg (has 2.5 mg tabs at home). During recent hospitalization (4/29-5/7), patient's INR was supratherapeutic  up to 4.3 and patient was given different warfarin regimen based on daily INR. This increase was contributed to increase in steroid dose. Once methylpred tapered off, INR decreased back to within goal of 2-3. After discharge, patient had elevated INR of 5 at Anti-coag visit on 5/21. Increase in INR likely due to change in diet/appetite after 9 day hospitalization. Patient was advised to adjust home regimen to Warfarin 2.5 mg daily except 3.75 mg (1.5 tablets) on Tuesday, Thursday, and Sunday. Unclear what regimen patient is currently following.   Baseline INR: 2.3 INR goal: 2-3 Last dose: unknown, patient admitted 5/29  Date INR Warfarin Dose  5/30 2.6 2.5mg   5/31 2.9     Goal of Therapy:  INR 2-3   Plan:  INR therapeutic this morning  Give warfarin 2.5 mg  PO x 1 (home regimen based on last Anti-coag visit) Recheck INR w/ AM labs INR & CBC at least once weekly when stable INR at goal  Molly Hodges A Molly Hodges, PharmD Clinical Pharmacist 01/16/2024 9:14 AM

## 2024-01-16 NOTE — Plan of Care (Signed)
  Problem: Education: Goal: Knowledge of General Education information will improve Description: Including pain rating scale, medication(s)/side effects and non-pharmacologic comfort measures Outcome: Progressing   Problem: Health Behavior/Discharge Planning: Goal: Ability to manage health-related needs will improve Outcome: Progressing   Problem: Clinical Measurements: Goal: Ability to maintain clinical measurements within normal limits will improve Outcome: Progressing Goal: Will remain free from infection Outcome: Progressing   Problem: Activity: Goal: Risk for activity intolerance will decrease Outcome: Progressing   Problem: Coping: Goal: Level of anxiety will decrease Outcome: Progressing   

## 2024-01-16 NOTE — TOC Progression Note (Signed)
 Transition of Care [facility]) - Progression Note    Patient Details  Name: Molly Hodges MRN: [id number] Date of Birth: 1932-04-25  Transition of Care [facility]) CM/SW Contact  Alexandra Ice, RN Phone Number: 01/16/2024, 11:23 AM  Clinical Narrative:     Patient states she is current with [facility] for home health services, sent message to Shaun with [facility] to confirm, awaiting response.       Expected Discharge Plan and Services                                               Social Determinants of Health (SDOH) Interventions SDOH Screenings   Food Insecurity: No Food Insecurity (01/15/2024)  Housing: Low Risk  (01/15/2024)  Transportation Needs: No Transportation Needs (01/15/2024)  Utilities: Not At Risk (12/30/2023)  Alcohol Screen: Low Risk  (12/29/2023)  Depression (PHQ2-9): Low Risk  (12/30/2023)  Financial Resource Strain: Low Risk  (01/15/2024)  Physical Activity: Insufficiently Active (12/29/2023)  Social Connections: Moderately Isolated (12/29/2023)  Stress: No Stress Concern Present (12/29/2023)  Tobacco Use: Low Risk  (01/15/2024)  Health Literacy: Adequate Health Literacy (12/29/2023)    Readmission Risk Interventions    08/07/2021    1:24 PM  Readmission Risk Prevention Plan  Transportation Screening Complete  PCP or Specialist Appt within 3-5 Days Complete  HRI or Home Care Consult Complete  Social Work Consult for Recovery Care Planning/Counseling Complete  Palliative Care Screening Not Applicable  Medication Review Oceanographer) Complete

## 2024-01-16 NOTE — Evaluation (Signed)
 Physical Therapy Evaluation Patient Details Name: Molly Hodges MRN: [id number] DOB: [birth year] Today's Date: 01/16/2024  History of Present Illness  Pt is a 89 y.o. caucasian female with medical history significant for COPD, CAD, asthma, carotid stenosis, essential HTN, dyslipidemia, and peripheral neuropathy as well as atrial fibrillation on coumadin , who presented to the emergency room with acute onset of dyspnea. MD assessment includes: COPD exacerbation and acute on chronic diastolic CHF.   Clinical Impression  Pt was pleasant and motivated to participate during the session and put forth good effort throughout. Pt required min A with bed mobility tasks and cuing for general sequencing with transfers.  Once in standing pt able to amb a self-selected max distance of around 6 feet near the EOB and from bed to chair before fatiguing and needing to return to sitting.  Pt ambulated with a slow cadence with short B step length but was steady with no overt LOB with HR and SpO2 WNL on room air.  Pt's SOB improved quickly once back in sitting.  Pt will benefit from continued PT services upon discharge to safely address deficits listed in patient problem list for decreased caregiver assistance and eventual return to PLOF.           If plan is discharge home, recommend the following: A little help with walking and/or transfers;A little help with bathing/dressing/bathroom;Assistance with cooking/housework;Assist for transportation   Can travel by private vehicle        Equipment Recommendations None recommended by PT  Recommendations for Other Services       Functional Status Assessment Patient has had a recent decline in their functional status and demonstrates the ability to make significant improvements in function in a reasonable and predictable amount of time.     Precautions / Restrictions Precautions Precautions: Fall Restrictions Weight Bearing Restrictions Per Provider Order: No       Mobility  Bed Mobility Overal bed mobility: Needs Assistance Bed Mobility: Supine to Sit     Supine to sit: Min assist     General bed mobility comments: Min A for trunk control during sup to sit    Transfers Overall transfer level: Needs assistance Equipment used: Rolling walker (2 wheels) Transfers: Sit to/from Stand Sit to Stand: Contact guard assist           General transfer comment: Mod verbal cues for hand placement with increased effort to come to standing but no physical assist needed    Ambulation/Gait Ambulation/Gait assistance: Contact guard assist Gait Distance (Feet): 6 Feet Assistive device: Rolling walker (2 wheels) Gait Pattern/deviations: Step-through pattern, Decreased step length - right, Decreased step length - left, Trunk flexed Gait velocity: decreased     General Gait Details: Slow cadence with short B step length but steady with no overt LOB; fatigued after max amb of around 6 feet with mod SOB but HR and SpO2 WNL  Stairs            Wheelchair Mobility     Tilt Bed    Modified Rankin (Stroke Patients Only)       Balance Overall balance assessment: Needs assistance   Sitting balance-Leahy Scale: Good     Standing balance support: Bilateral upper extremity supported, During functional activity Standing balance-Leahy Scale: Good                               Pertinent Vitals/Pain Pain Assessment Pain Assessment: No/denies pain  Home Living Family/patient expects to be discharged to:: Private residence Living Arrangements: Spouse/significant other Available Help at Discharge: Family;Personal care attendant Type of Home: House Home Access: Level entry       Home Layout: One level Home Equipment: Rollator (4 wheels);Rolling Walker (2 wheels);Wheelchair - manual;Shower seat;Grab bars - tub/shower Additional Comments: Friend assists with housework T and Thu, PCA assists with bathing patient's spouse  and housework M and F.    Prior Function Prior Level of Function : Independent/Modified Independent             Mobility Comments: Ind amb household distances with a RW, no fall history, w/c in the community ADLs Comments: Ind with ADLs     Extremity/Trunk Assessment   Upper Extremity Assessment Upper Extremity Assessment: Generalized weakness    Lower Extremity Assessment Lower Extremity Assessment: Generalized weakness       Communication   Communication Communication: No apparent difficulties    Cognition Arousal: Alert Behavior During Therapy: WFL for tasks assessed/performed   PT - Cognitive impairments: No apparent impairments                         Following commands: Intact       Cueing Cueing Techniques: Verbal cues     General Comments      Exercises     Assessment/Plan    PT Assessment Patient needs continued PT services  PT Problem List Decreased strength;Decreased activity tolerance;Decreased balance;Decreased mobility;Decreased knowledge of use of DME       PT Treatment Interventions DME instruction;Gait training;Functional mobility training;Therapeutic activities;Therapeutic exercise;Balance training;Patient/family education    PT Goals (Current goals can be found in the Care Plan section)  Acute Rehab PT Goals Patient Stated Goal: To be able to do more without getting short of breath PT Goal Formulation: With patient Time For Goal Achievement: 01/29/24 Potential to Achieve Goals: Fair    Frequency Min 2X/week     Co-evaluation               AM-PAC PT "6 Clicks" Mobility  Outcome Measure Help needed turning from your back to your side while in a flat bed without using bedrails?: A Little Help needed moving from lying on your back to sitting on the side of a flat bed without using bedrails?: A Little Help needed moving to and from a bed to a chair (including a wheelchair)?: A Little Help needed standing up from a  chair using your arms (e.g., wheelchair or bedside chair)?: A Little Help needed to walk in hospital room?: A Little Help needed climbing 3-5 steps with a railing? : A Lot 6 Click Score: 17    End of Session Equipment Utilized During Treatment: Gait belt Activity Tolerance: Patient tolerated treatment well Patient left: in chair;with call bell/phone within reach;with chair alarm set;with family/visitor present Nurse Communication: Mobility status PT Visit Diagnosis: Difficulty in walking, not elsewhere classified (R26.2);Muscle weakness (generalized) (M62.81)    Time: 1610-9604 PT Time Calculation (min) (ACUTE ONLY): 19 min   Charges:   PT Evaluation $PT Eval Moderate Complexity: 1 Mod   PT General Charges $$ ACUTE PT VISIT: 1 Visit        D. Scott Becka Lagasse PT, DPT 01/16/24, 11:16 AM

## 2024-01-16 NOTE — TOC Progression Note (Signed)
 Transition of Care [facility]) - Progression Note    Patient Details  Name: Molly Hodges MRN: [id number] Date of Birth: 10/05/1931  [facility]) CM/SW Contact  Alexandra Ice, RN Phone Number: 01/16/2024, 12:13 PM  Clinical Narrative:     Received response from Shaun with [facility], they are able to accept back. They will need a resumption of services.        Expected Discharge Plan and Services                                               Social Determinants of Health (SDOH) Interventions SDOH Screenings   Food Insecurity: No Food Insecurity (01/15/2024)  Housing: Low Risk  (01/15/2024)  Transportation Needs: No Transportation Needs (01/15/2024)  Utilities: Not At Risk (12/30/2023)  Alcohol Screen: Low Risk  (12/29/2023)  Depression (PHQ2-9): Low Risk  (12/30/2023)  Financial Resource Strain: Low Risk  (01/15/2024)  Physical Activity: Insufficiently Active (12/29/2023)  Social Connections: Moderately Isolated (12/29/2023)  Stress: No Stress Concern Present (12/29/2023)  Tobacco Use: Low Risk  (01/15/2024)  Health Literacy: Adequate Health Literacy (12/29/2023)    Readmission Risk Interventions    08/07/2021    1:24 PM  Readmission Risk Prevention Plan  Transportation Screening Complete  PCP or Specialist Appt within 3-5 Days Complete  HRI or Home Care Consult Complete  Social Work Consult for Recovery Care Planning/Counseling Complete  Palliative Care Screening Not Applicable  Medication Review Oceanographer) Complete

## 2024-01-16 NOTE — Progress Notes (Signed)
 PROGRESS NOTE    Molly Hodges  EAV:[id number] DOB: [birth year] DOA: 01/14/2024 PCP: Lamon Pillow, MD  Chief Complaint  Patient presents with   Shortness of Breath    Hospital Course:  This is a 89 year old female with coronary artery disease, asthma, carotid stenosis, hypertension, dyslipidemia, peripheral neuropathy, atrial fibrillation on Coumadin , who presents to the ED with acute onset dyspnea.  Patient reports that she has had dyspnea over the last 3 days, worse with exertion, associated dry cough.  She denies any chest pain or palpitations.  She also endorses worsening paroxysmal nocturnal dyspnea, denies orthopnea.  She has home oxygen  which she uses as needed and has had minimal improvement. Vital signs were within normal limits in the ED.  CBC with anemia but otherwise unremarkable.  BNP 488, INR 2.3.  CXR with small nodular opacities in the right middle and lower lobe. CTA revealed bronchial wall thickening in the lower lobes send associated groundglass nodular airspace disease of the right lung base this slightly improved from prior studies.  Also noted calcified granulomas in the lungs.  No effusions or pneumothorax.  Stable mediastinal adenopathy  Subjective: On evaluation this morning patient reports that she may still be too weak to discharge home.  She is feeling better.  She was able to work with physical therapy today.   Objective: Vitals:   01/15/24 2030 01/16/24 0400 01/16/24 0817 01/16/24 1438  BP:  (!) 109/54 108/60 (!) 108/46  Pulse:  72 72 74  Resp:  16 17 17   Temp:  97.7 F (36.5 C) 98.7 F (37.1 C) 98.2 F (36.8 C)  TempSrc:  Oral    SpO2: 95% 96% 96% 97%  Weight:      Height:        Intake/Output Summary (Last 24 hours) at 01/16/2024 1604 Last data filed at 01/16/2024 1429 Gross per 24 hour  Intake 560 ml  Output --  Net 560 ml   Filed Weights   01/14/24 1309  Weight: 52.6 kg    Examination: General exam: Appears calm and comfortable,  NAD  Respiratory system: No work of breathing, symmetric chest wall expansion, some crackles bilaterally, no wheeze. Cardiovascular system: S1 & S2 heard, RRR.  Gastrointestinal system: Abdomen is nondistended, soft and nontender.  Neuro: Alert and oriented. No focal neurological deficits. Extremities: Symmetric, expected ROM Skin: No rashes, lesions Psychiatry: Demonstrates appropriate judgement and insight. Mood & affect appropriate for situation.   Assessment & Plan:  Principal Problem:   COPD exacerbation (HCC) Active Problems:   Acute on chronic diastolic CHF (congestive heart failure) (HCC)   Peripheral neuropathy   Atrial fibrillation, chronic (HCC)   Coronary artery disease   Dyslipidemia   Essential hypertension  COPD exacerbation - Continue on telemetry - Status post 125 Solu-Medrol , continue with prednisone  taper for now - Continue scheduled nebulizers - As needed Mucinex  - Afebrile, no leukocytosis, no evidence of acute infection.  No indication for further antibiotics - Procalcitonin less than 0.10 - COVID/flu/RSV negative. - Continue supplemental oxygen  as needed.  Currently stable on room air  Acute on chronic diastolic CHF - BNP elevated, clinically appears euvolemic - She was started on IV Lasix .  She reports that she holds volume in her abdomen when congested.  Denies ever having lower extremity edema - Follow strict I's and O's.  Have discussed with charge RN today for output monitoring.  None to review for now - Continue with current dose of Lasix .  Kidney function appears to be  tolerating, will follow closely - Was started on IV Lasix  - Echocardiogram 1/25: LVEF 55 to 60%, degenerative mitral valve with moderate regurg and severe stenosis. - Patient follows with cardiology outpatient  Generalized deconditioning - In light of chronic CHF and COPD patient is becoming increasingly dyspneic on exertion which may be her new baseline - Have discussed this with  the patient's family, they are interested in palliative care services at discharge.  They will consider hospice if she is beginning to have more frequent hospitalizations - Have ordered home health PT/OT resumption of services  Leukocytosis - Likely secondary to steroids.  Afebrile.  No evidence of acute infection  Microcytic anemia - Baseline hemoglobin around 11 - Suspect iron deficiency  - Will obtain anemia labs  Hypertension - Continue home dose diltiazem   Dyslipidemia - On statin and Zetia   CAD - Continue home dose Plavix   A-fib, chronic - INR at goal - Currently in A-fib.  Rate controlled. - Continue current dose Coumadin   Peripheral neuropathy - Continue home dose Lyrica   DVT prophylaxis: warfarin   Code Status: Limited: Do not attempt resuscitation (DNR) -DNR-LIMITED -Do Not Intubate/DNI  Disposition: Patient reports too fatigued to discharge home today.  Will likely discharge home tomorrow if more stable.  Consultants:    Procedures:    Antimicrobials:  Anti-infectives (From admission, onward)    Start     Dose/Rate Route Frequency Ordered Stop   01/14/24 2130  cefTRIAXone  (ROCEPHIN ) 1 g in sodium chloride  0.9 % 100 mL IVPB  Status:  Discontinued        1 g 200 mL/hr over 30 Minutes Intravenous Every 24 hours 01/14/24 2041 01/15/24 1325       Data Reviewed: I have personally reviewed following labs and imaging studies CBC: Recent Labs  Lab 01/14/24 1339 01/15/24 0459 01/16/24 0814  WBC 10.9* 8.7 20.0*  HGB 11.5* 10.5* 10.2*  HCT 38.5 33.4* 32.4*  MCV 73.3* 70.8* 71.2*  PLT 360 410* 468*   Basic Metabolic Panel: Recent Labs  Lab 01/14/24 1339 01/15/24 0459 01/16/24 0814  NA 139 134* 134*  K 4.1 3.6 4.0  CL 100 97* 99  CO2 27 26 26   GLUCOSE 131* 351* 242*  BUN 20 22 29*  CREATININE 0.81 0.83 0.85  CALCIUM  8.4* 8.0* 8.2*   GFR: Estimated Creatinine Clearance: 36.5 mL/min (by C-G formula based on SCr of 0.85 mg/dL). Liver Function  Tests: Recent Labs  Lab 01/16/24 0814  AST 22  ALT 18  ALKPHOS 57  BILITOT 0.5  PROT 5.7*  ALBUMIN 2.4*   CBG: No results for input(s): "GLUCAP" in the last 168 hours.  Recent Results (from the past 240 hours)  Respiratory (~20 pathogens) panel by PCR     Status: None   Collection Time: 01/15/24 10:35 AM   Specimen: Nasopharyngeal Swab; Respiratory  Result Value Ref Range Status   Adenovirus NOT DETECTED NOT DETECTED Final   Coronavirus 229E NOT DETECTED NOT DETECTED Final    Comment: (NOTE) The Coronavirus on the Respiratory Panel, DOES NOT test for the novel  Coronavirus (2019 nCoV)    Coronavirus HKU1 NOT DETECTED NOT DETECTED Final   Coronavirus NL63 NOT DETECTED NOT DETECTED Final   Coronavirus OC43 NOT DETECTED NOT DETECTED Final   Metapneumovirus NOT DETECTED NOT DETECTED Final   Rhinovirus / Enterovirus NOT DETECTED NOT DETECTED Final   Influenza A NOT DETECTED NOT DETECTED Final   Influenza B NOT DETECTED NOT DETECTED Final   Parainfluenza Virus 1 NOT  DETECTED NOT DETECTED Final   Parainfluenza Virus 2 NOT DETECTED NOT DETECTED Final   Parainfluenza Virus 3 NOT DETECTED NOT DETECTED Final   Parainfluenza Virus 4 NOT DETECTED NOT DETECTED Final   Respiratory Syncytial Virus NOT DETECTED NOT DETECTED Final   Bordetella pertussis NOT DETECTED NOT DETECTED Final   Bordetella Parapertussis NOT DETECTED NOT DETECTED Final   Chlamydophila pneumoniae NOT DETECTED NOT DETECTED Final   Mycoplasma pneumoniae NOT DETECTED NOT DETECTED Final    Comment: Performed at [facility], [address] [place], [place] [address]  Resp panel by RT-PCR (RSV, Flu A&B, Covid) Anterior Nasal Swab     Status: None   Collection Time: 01/15/24 10:35 AM   Specimen: Anterior Nasal Swab  Result Value Ref Range Status   SARS Coronavirus 2 by RT PCR NEGATIVE NEGATIVE Final    Comment: (NOTE) SARS-CoV-2 target nucleic acids are NOT DETECTED.  The SARS-CoV-2 RNA is generally detectable  in upper respiratory specimens during the acute phase of infection. The lowest concentration of SARS-CoV-2 viral copies this assay can detect is 138 copies/mL. A negative result does not preclude SARS-Cov-2 infection and should not be used as the sole basis for treatment or other patient management decisions. A negative result may occur with  improper specimen collection/handling, submission of specimen other than nasopharyngeal swab, presence of viral mutation(s) within the areas targeted by this assay, and inadequate number of viral copies(<138 copies/mL). A negative result must be combined with clinical observations, patient history, and epidemiological information. The expected result is Negative.  Fact Sheet for Patients:  BloggerCourse.com  Fact Sheet for Healthcare Providers:  SeriousBroker.it  This test is no t yet approved or cleared by the [place]  FDA and  has been authorized for detection and/or diagnosis of SARS-CoV-2 by FDA under an Emergency Use Authorization (EUA). This EUA will remain  in effect (meaning this test can be used) for the duration of the COVID-19 declaration under Section 564(b)(1) of the Act, 21 U.S.C.section 360bbb-3(b)(1), unless the authorization is terminated  or revoked sooner.       Influenza A by PCR NEGATIVE NEGATIVE Final   Influenza B by PCR NEGATIVE NEGATIVE Final    Comment: (NOTE) The Xpert Xpress SARS-CoV-2/FLU/RSV plus assay is intended as an aid in the diagnosis of influenza from Nasopharyngeal swab specimens and should not be used as a sole basis for treatment. Nasal washings and aspirates are unacceptable for Xpert Xpress SARS-CoV-2/FLU/RSV testing.  Fact Sheet for Patients: BloggerCourse.com  Fact Sheet for Healthcare Providers: SeriousBroker.it  This test is not yet approved or cleared by the [place]  FDA and has  been authorized for detection and/or diagnosis of SARS-CoV-2 by FDA under an Emergency Use Authorization (EUA). This EUA will remain in effect (meaning this test can be used) for the duration of the COVID-19 declaration under Section 564(b)(1) of the Act, 21 U.S.C. section 360bbb-3(b)(1), unless the authorization is terminated or revoked.     Resp Syncytial Virus by PCR NEGATIVE NEGATIVE Final    Comment: (NOTE) Fact Sheet for Patients: BloggerCourse.com  Fact Sheet for Healthcare Providers: SeriousBroker.it  This test is not yet approved or cleared by the [place]  FDA and has been authorized for detection and/or diagnosis of SARS-CoV-2 by FDA under an Emergency Use Authorization (EUA). This EUA will remain in effect (meaning this test can be used) for the duration of the COVID-19 declaration under Section 564(b)(1) of the Act, 21 U.S.C. section 360bbb-3(b)(1), unless the authorization is terminated  or revoked.  Performed at [facility], [address] [place], [place] [address]      Radiology Studies: CT Angio Chest PE W and/or Wo Contrast Result Date: 01/14/2024 CLINICAL DATA:  Shortness of breath EXAM: CT ANGIOGRAPHY CHEST WITH CONTRAST TECHNIQUE: Multidetector CT imaging of the chest was performed using the standard protocol during bolus administration of intravenous contrast. Multiplanar CT image reconstructions and MIPs were obtained to evaluate the vascular anatomy. RADIATION DOSE REDUCTION: This exam was performed according to the departmental dose-optimization program which includes automated exposure control, adjustment of the mA and/or kV according to patient size and/or use of iterative reconstruction technique. CONTRAST:  75mL OMNIPAQUE  IOHEXOL  350 MG/ML SOLN COMPARISON:  12/15/2023 FINDINGS: Cardiovascular: Cardiomegaly. Aorta normal caliber with moderate atherosclerosis. Coronary artery disease. Stents  noted in the left coronary arteries. No filling defects in the pulmonary arteries to suggest pulmonary emboli. Mediastinum/Nodes: Mediastinal adenopathy again noted, stable. No axillary or hilar adenopathy. Trachea and esophagus are unremarkable. Thyroid  unremarkable. Lungs/Pleura: Bronchial wall thickening again noted in the lower lobes. Associated ground-glass nodular airspace disease at the right lung base, slightly improved since prior study. Calcified granulomas in the lungs. No effusions or pneumothorax. Upper Abdomen: No acute findings Musculoskeletal: Chest wall soft tissues are unremarkable. No acute bony abnormality. Review of the MIP images confirms the above findings. IMPRESSION: No evidence of pulmonary embolus. Cardiomegaly, coronary artery disease. Stable mediastinal adenopathy. Chronic bronchial wall thickening and associated peripheral nodular densities at the right lung base, likely chronic small airways disease. Aortic Atherosclerosis (ICD10-I70.0). Electronically Signed   By: Janeece Mechanic M.D.   On: 01/14/2024 20:09    Scheduled Meds:  arformoterol   15 mcg Nebulization BID   ascorbic acid   500 mg Oral Daily   budesonide   0.5 mg Nebulization BID   cholecalciferol   1,000 Units Oral Daily   clopidogrel   75 mg Oral Daily   diltiazem   120 mg Oral Daily   dorzolamide   1 drop Both Eyes BID   ezetimibe   10 mg Oral Daily   furosemide   20 mg Intravenous BID   latanoprost   1 drop Both Eyes QHS   potassium chloride   20 mEq Oral Daily   predniSONE   40 mg Oral Q breakfast   pregabalin   50 mg Oral BID   rosuvastatin   20 mg Oral Daily   warfarin  2.5 mg Oral ONCE-1600   Warfarin - Pharmacist Dosing Inpatient   Does not apply q1600   Continuous Infusions:     LOS: 0 days  MDM: Patient is high risk for one or more organ failure.  They necessitate ongoing hospitalization for continued IV therapies and subsequent lab monitoring. Total time spent interpreting labs and vitals, reviewing the  medical record, coordinating care amongst consultants and care team members, directly assessing and discussing care with the patient and/or family: 55 min  Parthena Fergeson, DO [facility]  To contact the attending physician between 7A-7P please use Epic Chat. To contact the covering physician during after hours 7P-7A, please review Amion.  01/16/2024, 4:04 PM   *This document has been created with the assistance of dictation software. Please excuse typographical errors. *

## 2024-01-17 DIAGNOSIS — I482 Chronic atrial fibrillation, unspecified: Secondary | ICD-10-CM | POA: Diagnosis not present

## 2024-01-17 DIAGNOSIS — G629 Polyneuropathy, unspecified: Secondary | ICD-10-CM | POA: Diagnosis not present

## 2024-01-17 DIAGNOSIS — I10 Essential (primary) hypertension: Secondary | ICD-10-CM | POA: Diagnosis not present

## 2024-01-17 DIAGNOSIS — E785 Hyperlipidemia, unspecified: Secondary | ICD-10-CM | POA: Diagnosis not present

## 2024-01-17 DIAGNOSIS — J441 Chronic obstructive pulmonary disease with (acute) exacerbation: Secondary | ICD-10-CM | POA: Diagnosis not present

## 2024-01-17 DIAGNOSIS — I5033 Acute on chronic diastolic (congestive) heart failure: Secondary | ICD-10-CM | POA: Diagnosis not present

## 2024-01-17 DIAGNOSIS — I2585 Chronic coronary microvascular dysfunction: Secondary | ICD-10-CM | POA: Diagnosis not present

## 2024-01-17 LAB — VITAMIN B12: Vitamin B-12: 506 pg/mL (ref 180–914)

## 2024-01-17 LAB — PROTIME-INR
INR: 3.4 — ABNORMAL HIGH (ref 0.8–1.2)
Prothrombin Time: 34.3 s — ABNORMAL HIGH (ref 11.4–15.2)

## 2024-01-17 MED ORDER — FERROUS SULFATE 325 (65 FE) MG PO TBEC: 325.0000 mg | DELAYED_RELEASE_TABLET | Freq: Every day | ORAL | 3 refills | Status: DC

## 2024-01-17 MED ORDER — PREDNISONE 20 MG PO TABS: ORAL_TABLET | ORAL | 0 refills | Status: AC

## 2024-01-17 NOTE — Plan of Care (Signed)
  Problem: Education: Goal: Knowledge of General Education information will improve Description: Including pain rating scale, medication(s)/side effects and non-pharmacologic comfort measures 01/17/2024 1444 by Apolinar Baxter, RN Outcome: Adequate for Discharge 01/17/2024 1443 by Apolinar Baxter, RN Outcome: Progressing   Problem: Health Behavior/Discharge Planning: Goal: Ability to manage health-related needs will improve 01/17/2024 1444 by Apolinar Baxter, RN Outcome: Adequate for Discharge 01/17/2024 1443 by Apolinar Baxter, RN Outcome: Progressing   Problem: Clinical Measurements: Goal: Ability to maintain clinical measurements within normal limits will improve Outcome: Adequate for Discharge Goal: Will remain free from infection Outcome: Adequate for Discharge Goal: Diagnostic test results will improve Outcome: Adequate for Discharge Goal: Respiratory complications will improve Outcome: Adequate for Discharge Goal: Cardiovascular complication will be avoided Outcome: Adequate for Discharge   Problem: Activity: Goal: Risk for activity intolerance will decrease Outcome: Adequate for Discharge   Problem: Nutrition: Goal: Adequate nutrition will be maintained Outcome: Adequate for Discharge   Problem: Coping: Goal: Level of anxiety will decrease Outcome: Adequate for Discharge   Problem: Elimination: Goal: Will not experience complications related to bowel motility Outcome: Adequate for Discharge Goal: Will not experience complications related to urinary retention Outcome: Adequate for Discharge   Problem: Pain Managment: Goal: General experience of comfort will improve and/or be controlled Outcome: Adequate for Discharge   Problem: Safety: Goal: Ability to remain free from injury will improve Outcome: Adequate for Discharge   Problem: Skin Integrity: Goal: Risk for impaired skin integrity will decrease Outcome: Adequate for Discharge   Problem: Education: Goal:  Knowledge of disease or condition will improve Outcome: Adequate for Discharge Goal: Knowledge of the prescribed therapeutic regimen will improve Outcome: Adequate for Discharge Goal: Individualized Educational Video(s) Outcome: Adequate for Discharge   Problem: Activity: Goal: Ability to tolerate increased activity will improve Outcome: Adequate for Discharge Goal: Will verbalize the importance of balancing activity with adequate rest periods Outcome: Adequate for Discharge   Problem: Respiratory: Goal: Ability to maintain a clear airway will improve Outcome: Adequate for Discharge Goal: Levels of oxygenation will improve Outcome: Adequate for Discharge Goal: Ability to maintain adequate ventilation will improve Outcome: Adequate for Discharge

## 2024-01-17 NOTE — Progress Notes (Signed)
 PHARMACY - ANTICOAGULATION CONSULT NOTE  Pharmacy Consult for Warfarin (PTA Med) Indication: atrial fibrillation  Allergies  Allergen Reactions   Shellfish Allergy Anaphylaxis    Other reaction(s): Hallucination   Azithromycin  Other (See Comments)    Extreme burning sensation at IV site    Tamiflu [Oseltamivir Phosphate] Other (See Comments)    Reaction:  Hallucinations     Albuterol  Palpitations    Heart racing.    Albuterol  And Levalbuterol  Palpitations    Patient Measurements: Height: 5\' 5"  (165.1 cm) Weight: 52.6 kg (116 lb) IBW/kg (Calculated) : 57 HEPARIN  DW (KG): 52.6  Vital Signs: Temp: 97.7 F (36.5 C) (06/01 0800) Temp Source: Oral (06/01 0800) BP: 139/50 (06/01 0800) Pulse Rate: 62 (06/01 0800)  Labs: Recent Labs    01/14/24 1339 01/14/24 1644 01/15/24 0459 01/16/24 0814 01/17/24 0648  HGB 11.5*  --  10.5* 10.2*  --   HCT 38.5  --  33.4* 32.4*  --   PLT 360  --  410* 468*  --   LABPROT  --    < > 27.7* 30.5* 34.3*  INR  --    < > 2.6* 2.9* 3.4*  CREATININE 0.81  --  0.83 0.85  --   TROPONINIHS 16  --   --   --   --    < > = values in this interval not displayed.    Estimated Creatinine Clearance: 36.5 mL/min (by C-G formula based on SCr of 0.85 mg/dL).   Medical History: Past Medical History:  Diagnosis Date   Aortic atherosclerosis (HCC)    a. 05/2021 TEE: GrIII atheroma plaque involving the asc, transverse, and desc Ao.   Asthma    CAD (coronary artery disease)    a. 04/2021 Cath: LM nl, LAD 85p/m, D1 80, RI nl, LCX nl, RCA nl; b. 07/2021 PCI: pLAD (2.75x26 Onyx Frontier DES), D1 (2.5x22 Onyx Frontier DES).   Carotid artery disease (HCC)    a. s/p R carotid stenting (9mm x 7mm x 4cm long Exact stent); b. 06/2021 U/S: RICA 40-59%, LICA 40-59%.   CHF (congestive heart failure) (HCC)    Community acquired pneumonia    Essential hypertension    Glaucoma    Hyperlipidemia    Mitral regurgitation    a. TTE 08/2015: EF 60-65%, normal wall  motion, mild MR, mildly dilated left atrium measuring 40 mm, RVSF normal, PASP normal; b. 05/2021 TEE: Moderate MR.   Mitral stenosis    a. 05/2021 L/RHC: Sev MS w/ mean grad 13-62mmHg and MV area of 0.5-06.cm^2; b. 05/2021 TEE: EF 55-60%, no rwma, nl RV fxn, mod MR, mod MS (MV area by P1/2t: 1.61 cm^2 w/ mean grad of ).   Peripheral neuropathy    Persistent atrial fibrillation (HCC) 09/17/2015   a. s/p DCCV 11/15/2015; b. CHADS2VASc => 4 (HTN, age x 2, female)--> warfarin; c. 06/2021 recurrent afib-->amio added.   Squamous cell carcinoma of skin 12/18/2021   R dorsum hand, EDC   Squamous cell carcinoma of skin 12/31/2021   R hand dorsum, recurrent - excised 02/04/2022   Squamous cell carcinoma of skin 12/31/2021   L forearm - ED&C   Squamous cell carcinoma of skin 06/24/2022   R thumb webspace with wart virus - ED&C   Squamous cell carcinoma of skin 10/20/2022   L lat knee - tx with ED&C    Assessment: Pt is a 89 yo female with chronic hx of A fib on Warfarin presenting to ED c/o acute onset of  dyspnea, worsening over the last 3 days, worsening with exertion as well as dry cough without much wheezing.  Home regimen: Previously on Warfarin 2.5 mg PO MWF, all other days 3.75 mg (has 2.5 mg tabs at home). During recent hospitalization (4/29-5/7), patient's INR was supratherapeutic up to 4.3 and patient was given different warfarin regimen based on daily INR. This increase was contributed to increase in steroid dose. Once methylpred tapered off, INR decreased back to within goal of 2-3. After discharge, patient had elevated INR of 5 at Anti-coag visit on 5/21. Increase in INR likely due to change in diet/appetite after 9 day hospitalization. Patient was advised to adjust home regimen to Warfarin 2.5 mg daily except 3.75 mg (1.5 tablets) on Tuesday, Thursday, and Sunday. Unclear what regimen patient is currently following.   Baseline INR: 2.3 INR goal: 2-3 Last dose: unknown, patient admitted  5/29  Date INR Warfarin Dose  5/30 2.6 2.5mg   5/31 2.9 2.5mg   6/1 3.4     Goal of Therapy:  INR 2-3   Plan:  INR SUPRAtherapeutic this morning Will hold warfarin dose for today Recheck INR w/ AM labs INR & CBC at least once weekly when stable INR at goal  Delany Steury A Paulina Muchmore, PharmD Clinical Pharmacist 01/17/2024 8:16 AM

## 2024-01-17 NOTE — Plan of Care (Signed)
   Problem: Education: Goal: Knowledge of General Education information will improve Description Including pain rating scale, medication(s)/side effects and non-pharmacologic comfort measures Outcome: Progressing   Problem: Health Behavior/Discharge Planning: Goal: Ability to manage health-related needs will improve Outcome: Progressing

## 2024-01-17 NOTE — TOC Transition Note (Signed)
 Transition of Care [facility]) - Discharge Note   Patient Details  Name: Molly Hodges MRN: [id number] Date of Birth: 06-12-1932  Transition of [facility]) CM/SW Contact:  Loman Risk, RN Phone Number: 01/17/2024, 2:47 PM   Clinical Narrative:      Patient to discharge today Shaun with [facility] notified of discharge        Patient Goals and CMS Choice            Discharge Placement                       Discharge Plan and Services Additional resources added to the After Visit Summary for                                       Social Drivers of Health (SDOH) Interventions SDOH Screenings   Food Insecurity: No Food Insecurity (01/15/2024)  Housing: Low Risk  (01/15/2024)  Transportation Needs: No Transportation Needs (01/15/2024)  Utilities: Not At Risk (01/16/2024)  Alcohol Screen: Low Risk  (12/29/2023)  Depression (PHQ2-9): Low Risk  (12/30/2023)  Financial Resource Strain: Low Risk  (01/15/2024)  Physical Activity: Insufficiently Active (12/29/2023)  Social Connections: Unknown (01/16/2024)  Recent Concern: Social Connections - Moderately Isolated (12/29/2023)  Stress: No Stress Concern Present (12/29/2023)  Tobacco Use: Low Risk  (01/15/2024)  Health Literacy: Adequate Health Literacy (12/29/2023)     Readmission Risk Interventions    08/07/2021    1:24 PM  Readmission Risk Prevention Plan  Transportation Screening Complete  PCP or Specialist Appt within 3-5 Days Complete  HRI or Home Care Consult Complete  Social Work Consult for Recovery Care Planning/Counseling Complete  Palliative Care Screening Not Applicable  Medication Review Oceanographer) Complete

## 2024-01-17 NOTE — Discharge Summary (Signed)
 Physician Discharge Summary   Patient: Molly Hodges MRN: [id number] DOB: 1932-05-09  Admit date:     01/14/2024  Discharge date: 01/17/24  Discharge Physician: Roise Cleaver   PCP: Lamon Pillow, MD   Recommendations at discharge:   Follow-up with cardiology, pulmonology, primary care for chronic disease management  Discharge Diagnoses: Principal Problem:   COPD exacerbation (HCC) Active Problems:   Acute on chronic diastolic CHF (congestive heart failure) (HCC)   Peripheral neuropathy   Atrial fibrillation, chronic (HCC)   Coronary artery disease   Dyslipidemia   Essential hypertension  Resolved Problems:   * No resolved hospital problems. [facility] Course: This is a 89 year old female with coronary artery disease, asthma, carotid stenosis, hypertension, dyslipidemia, peripheral neuropathy, atrial fibrillation on Coumadin , who presented to the ED with acute onset dyspnea.  Patient reports that she has had dyspnea over the last 3 days, worse with exertion, associated dry cough.  She denies any chest pain or palpitations.  She also endorses worsening paroxysmal nocturnal dyspnea, denies orthopnea.  She has home oxygen  which she uses as needed and has had minimal improvement. Vital signs were within normal limits in the ED.  CBC with anemia but otherwise unremarkable.  BNP 488, INR 2.3.  CXR with small nodular opacities in the right middle and lower lobe. CTA revealed bronchial wall thickening in the lower lobes send associated groundglass nodular airspace disease of the right lung base, this slightly improved from prior studies.  Also noted calcified granulomas in the lungs.  No effusions or pneumothorax.  Stable mediastinal adenopathy.  Patient was admitted for COPD exacerbation.  Procalcitonin to be less than 0.10, no evidence of bacterial pneumonia, no indication for antibiotics.  Patient was treated with supplemental oxygen , steroids, nebulizer therapies.  Gradually she  returned to her baseline. We did have extensive discussion regarding the possibility that patient's COPD and heart failure may be progressing and that patient's dyspnea might be a new baseline for her.  She worked with physical therapy while admitted and home health was arranged prior to discharge. Patient has established outpatient follow-up with her primary care physician, pulmonologist, cardiologist, and warfarin clinic this week.  COPD exacerbation - Solu-Medrol  125, continue with prednisone  taper - Continue home dose nebulizers - As needed Mucinex  - Afebrile, no leukocytosis, no evidence of acute infection.  No indication for further antibiotics - Procalcitonin less than 0.10 - COVID/flu/RSV negative.   Acute on chronic diastolic CHF - BNP elevated, euvolemic on arrival - Patient reports she typically holds volume in her abdomen when congested.  She reports feeling at baseline - Given low-dose IV Lasix  while admitted.  Resume home diuretics at discharge - Echocardiogram 1/25: LVEF 55 to 60%, degenerative mitral valve with moderate regurg and severe stenosis. - Patient follows with cardiology outpatient   Generalized deconditioning - In light of chronic CHF and COPD patient is becoming increasingly dyspneic on exertion which may be her new baseline - Have discussed this with the patient's family, they are interested in palliative care services at discharge.  They will consider hospice if she is beginning to have more frequent hospitalizations - Continue home health PT/OT    Leukocytosis - Likely secondary to steroids.  Afebrile.  No evidence of acute infection   Microcytic anemia - Baseline hemoglobin around 11 - Labs consistent with iron deficiency.  Start ferrous sulfate at discharge   Hypertension - Continue home dose diltiazem    Dyslipidemia - On statin and Zetia    CAD -  Patient reports that she was recently taken off of Plavix .  I cannot find documentation to support  this.  Have requested that she discuss further with cardiology.  Will proceed with Coumadin  only for now   A-fib, chronic - INR supratherapeutic today.  Hold warfarin today - Has established follow-up with Coumadin  clinic this week.  Patient was advised to adjust home regimen to Warfarin 2.5 mg daily except 3.75 mg (1.5 tablets) on Tuesday, Thursday, and Sunday.  -- Tapering prednisone, anticipate she will stabilize again once out of hospital and following typical home diet and regimen   Peripheral neuropathy - Continue home dose Lyrica  Disposition: Home health Diet recommendation:  Discharge Diet Orders (From admission, onward)     Start     Ordered   01/17/24 0000  Diet general        06 /01/25 1430           Discharge Instructions     Call MD for:  difficulty breathing, headache or visual disturbances   Complete by: As directed    Call MD for:  persistant dizziness or light-headedness   Complete by: As directed    Call MD for:  persistant nausea and vomiting   Complete by: As directed    Call MD for:  severe uncontrolled pain   Complete by: As directed    Call MD for:  temperature >100.4   Complete by: As directed    Diet general   Complete by: As directed    Discharge instructions   Complete by: As directed    On chart review it appears that you are prescribed a medication called Plavix  (clopidogrel ), per your report you were told to discontinue this medication.  Please discuss this directly with cardiology at your upcoming appointment and verify that you are not meant to be taking this medication.   INR today is slightly above goal.  Please hold your warfarin dose today.  Resume normal dosing tomorrow.  Follow-up with warfarin clinic on June 4  You are meant to see pulmonology in clinic this June.  If you have not heard from Dr. Landa Pine office please reach out to them directly to make this appointment and discuss your nebulizer therapies.   Increase activity slowly    Complete by: As directed         DISCHARGE MEDICATION: Allergies as of 01/17/2024       Reactions   Shellfish Allergy Anaphylaxis   Other reaction(s): Hallucination   Azithromycin  Other (See Comments)   Extreme burning sensation at IV site   Tamiflu [oseltamivir Phosphate] Other (See Comments)   Reaction:  Hallucinations    Albuterol  Palpitations   Heart racing.    Albuterol  And Levalbuterol  Palpitations        Medication List     STOP taking these medications    clopidogrel  75 MG tablet Commonly known as: PLAVIX        TAKE these medications    arformoterol  15 MCG/2ML Nebu Commonly known as: Brovana  Take 2 mLs (15 mcg total) by nebulization 2 (two) times daily.   ascorbic acid  500 MG tablet Commonly known as: VITAMIN C  Take 500 mg by mouth daily.   budesonide  0.5 MG/2ML nebulizer solution Commonly known as: PULMICORT  Take 2 mLs (0.5 mg total) by nebulization 2 (two) times daily.   cholecalciferol  1000 units tablet Commonly known as: VITAMIN D  Take 1,000 Units by mouth daily.   diltiazem  120 MG 24 hr capsule Commonly known as: CARDIZEM  CD TAKE 1  CAPSULE BY MOUTH EVERY DAY   dorzolamide  2 % ophthalmic solution Commonly known as: TRUSOPT  Place 1 drop into both eyes 2 (two) times daily.   ezetimibe  10 MG tablet Commonly known as: ZETIA  TAKE 1 TABLET BY MOUTH DAILY   ipratropium-albuterol  0.5-2.5 (3) MG/3ML Soln Commonly known as: DUONEB TAKE BY NEBULIZATION EVERY 4 HOURS AS NEEDED   latanoprost  0.005 % ophthalmic solution Commonly known as: XALATAN  Place 1 drop into both eyes at bedtime.   levalbuterol  45 MCG/ACT inhaler Commonly known as: Xopenex  HFA Inhale 2 puffs into the lungs every 6 (six) hours as needed for wheezing.   nitroGLYCERIN  0.4 MG SL tablet Commonly known as: Nitrostat  Place 1 tablet (0.4 mg total) under the tongue every 5 (five) minutes as needed (for chest pain or shortness of breath).   potassium chloride  10 MEQ  tablet Commonly known as: KLOR-CON  TAKE 2 TABLETS BY MOUTH DAILY. TAKE EXTRA 2 TABLETS WHEN TAKING EXTRA LASIX .   predniSONE  20 MG tablet Commonly known as: DELTASONE  Take 2 tablets (40 mg total) by mouth daily with breakfast for 2 days, THEN 1 tablet (20 mg total) daily with breakfast for 2 days, THEN 0.5 tablets (10 mg total) daily with breakfast for 2 days. Start taking on: January 17, 2024   pregabalin  50 MG capsule Commonly known as: LYRICA  TAKE 1 CAPSULE BY MOUTH 2 TIMES DAILY   rosuvastatin  20 MG tablet Commonly known as: CRESTOR  TAKE 1 TABLET BY MOUTH DAILY   torsemide  20 MG tablet Commonly known as: DEMADEX  Take 1 tablet (20 mg total) by mouth daily. You may take additional dose (20 mg tab) for 3 pound gain overnight or 5 pound gain in 1 week.   warfarin 2.5 MG tablet Commonly known as: COUMADIN  Take as directed. If you are unsure how to take this medication, talk to your nurse or doctor. Original instructions: TAKE 1 & 1/2 TABLETS DAILY EXCEPT TAKE ONE TABLET ON MONDAYS, WEDNESDAYS, AND FRIDAYS OR AS DIRECTED BY COUMADIN  CLINIC        Follow-up Information     [facility]. Go on 01/25/2024.   Specialty: Cardiology Why: Hospital Follow-Up 01/25/2024 @ 10:30 AM Please bring all medications to follow-up appointment [facility], [address] Floor Free Valet Parking @ the door Contact information: [address] [place]  [phone]               Discharge Exam: Cleavon Curls Weights   01/14/24 1309  Weight: 52.6 kg   Constitutional:  Normal appearance. Non toxic-appearing.  HENT: Head Normocephalic and atraumatic.  Mucous membranes are moist.  Eyes:  Extraocular intact. Conjunctivae normal. Pupils are equal, round, and reactive to light.  Cardiovascular: Rate and Rhythm: Normal rate and regular rhythm.  Pulmonary: Non labored, symmetric rise of chest wall.   Musculoskeletal:  Normal range of motion.  Skin: warm and dry. not jaundiced.  Neurological: No focal deficit present. alert. Oriented. Psychiatric: Mood and Affect congruent.   Condition at discharge: stable  The results of significant diagnostics from this hospitalization (including imaging, microbiology, ancillary and laboratory) are listed below for reference.   Imaging Studies: CT Angio Chest PE W and/or Wo Contrast Result Date: 01/14/2024 CLINICAL DATA:  Shortness of breath EXAM: CT ANGIOGRAPHY CHEST WITH CONTRAST TECHNIQUE: Multidetector CT imaging of the chest was performed using the standard protocol during bolus administration of intravenous contrast. Multiplanar CT image reconstructions and MIPs were obtained to evaluate the  vascular anatomy. RADIATION DOSE REDUCTION: This exam was performed according to the departmental dose-optimization program which includes automated exposure control, adjustment of the mA and/or kV according to patient size and/or use of iterative reconstruction technique. CONTRAST:  75mL OMNIPAQUE  IOHEXOL  350 MG/ML SOLN COMPARISON:  12/15/2023 FINDINGS: Cardiovascular: Cardiomegaly. Aorta normal caliber with moderate atherosclerosis. Coronary artery disease. Stents noted in the left coronary arteries. No filling defects in the pulmonary arteries to suggest pulmonary emboli. Mediastinum/Nodes: Mediastinal adenopathy again noted, stable. No axillary or hilar adenopathy. Trachea and esophagus are unremarkable. Thyroid  unremarkable. Lungs/Pleura: Bronchial wall thickening again noted in the lower lobes. Associated ground-glass nodular airspace disease at the right lung base, slightly improved since prior study. Calcified granulomas in the lungs. No effusions or pneumothorax. Upper Abdomen: No acute findings Musculoskeletal: Chest wall soft tissues are unremarkable. No acute bony abnormality. Review of the MIP images confirms the above findings. IMPRESSION: No evidence of  pulmonary embolus. Cardiomegaly, coronary artery disease. Stable mediastinal adenopathy. Chronic bronchial wall thickening and associated peripheral nodular densities at the right lung base, likely chronic small airways disease. Aortic Atherosclerosis (ICD10-I70.0). Electronically Signed   By: Janeece Mechanic M.D.   On: 01/14/2024 20:09   DG Chest 2 View Result Date: 01/14/2024 CLINICAL DATA:  shortness of breath EXAM: CHEST - 2 VIEW COMPARISON:  December 15, 2023 FINDINGS: A few small nodular opacities noted in the right mid and lower lung zones. Findings suggestive of bronchial wall thickening better visualized on the comparison CT. No focal airspace consolidation, pleural effusion, or pneumothorax. Unchanged cardiomegaly. Tortuous aorta with aortic atherosclerosis. No acute fracture or destructive lesions. Multilevel thoracic osteophytosis. Osteopenia. Right AC joint osteoarthritis. IMPRESSION: Few small nodular opacities noted in the right mid and lower lung zones, which may reflect changes of an infectious or inflammatory bronchiolitis or aspiration. No lobar pneumonia or pleural effusion. Electronically Signed   By: Rance Burrows M.D.   On: 01/14/2024 16:06    Microbiology: Results for orders placed or performed during the hospital encounter of 01/14/24  Respiratory (~20 pathogens) panel by PCR     Status: None   Collection Time: 01/15/24 10:35 AM   Specimen: Nasopharyngeal Swab; Respiratory  Result Value Ref Range Status   Adenovirus NOT DETECTED NOT DETECTED Final   Coronavirus 229E NOT DETECTED NOT DETECTED Final    Comment: (NOTE) The Coronavirus on the Respiratory Panel, DOES NOT test for the novel  Coronavirus (2019 nCoV)    Coronavirus HKU1 NOT DETECTED NOT DETECTED Final   Coronavirus NL63 NOT DETECTED NOT DETECTED Final   Coronavirus OC43 NOT DETECTED NOT DETECTED Final   Metapneumovirus NOT DETECTED NOT DETECTED Final   Rhinovirus / Enterovirus NOT DETECTED NOT DETECTED Final    Influenza A NOT DETECTED NOT DETECTED Final   Influenza B NOT DETECTED NOT DETECTED Final   Parainfluenza Virus 1 NOT DETECTED NOT DETECTED Final   Parainfluenza Virus 2 NOT DETECTED NOT DETECTED Final   Parainfluenza Virus 3 NOT DETECTED NOT DETECTED Final   Parainfluenza Virus 4 NOT DETECTED NOT DETECTED Final   Respiratory Syncytial Virus NOT DETECTED NOT DETECTED Final   Bordetella pertussis NOT DETECTED NOT DETECTED Final   Bordetella Parapertussis NOT DETECTED NOT DETECTED Final   Chlamydophila pneumoniae NOT DETECTED NOT DETECTED Final   Mycoplasma pneumoniae NOT DETECTED NOT DETECTED Final    Comment: Performed at [facility], [address] [place], [place] [address]  Resp panel by RT-PCR (RSV, Flu A&B, Covid) Anterior Nasal Swab  Status: None   Collection Time: 01/15/24 10:35 AM   Specimen: Anterior Nasal Swab  Result Value Ref Range Status   SARS Coronavirus 2 by RT PCR NEGATIVE NEGATIVE Final    Comment: (NOTE) SARS-CoV-2 target nucleic acids are NOT DETECTED.  The SARS-CoV-2 RNA is generally detectable in upper respiratory specimens during the acute phase of infection. The lowest concentration of SARS-CoV-2 viral copies this assay can detect is 138 copies/mL. A negative result does not preclude SARS-Cov-2 infection and should not be used as the sole basis for treatment or other patient management decisions. A negative result may occur with  improper specimen collection/handling, submission of specimen other than nasopharyngeal swab, presence of viral mutation(s) within the areas targeted by this assay, and inadequate number of viral copies(<138 copies/mL). A negative result must be combined with clinical observations, patient history, and epidemiological information. The expected result is Negative.  Fact Sheet for Patients:  BloggerCourse.com  Fact Sheet for Healthcare Providers:  SeriousBroker.it  This  test is no t yet approved or cleared by the [place]  FDA and  has been authorized for detection and/or diagnosis of SARS-CoV-2 by FDA under an Emergency Use Authorization (EUA). This EUA will remain  in effect (meaning this test can be used) for the duration of the COVID-19 declaration under Section 564(b)(1) of the Act, 21 U.S.C.section 360bbb-3(b)(1), unless the authorization is terminated  or revoked sooner.       Influenza A by PCR NEGATIVE NEGATIVE Final   Influenza B by PCR NEGATIVE NEGATIVE Final    Comment: (NOTE) The Xpert Xpress SARS-CoV-2/FLU/RSV plus assay is intended as an aid in the diagnosis of influenza from Nasopharyngeal swab specimens and should not be used as a sole basis for treatment. Nasal washings and aspirates are unacceptable for Xpert Xpress SARS-CoV-2/FLU/RSV testing.  Fact Sheet for Patients: BloggerCourse.com  Fact Sheet for Healthcare Providers: SeriousBroker.it  This test is not yet approved or cleared by the [place]  FDA and has been authorized for detection and/or diagnosis of SARS-CoV-2 by FDA under an Emergency Use Authorization (EUA). This EUA will remain in effect (meaning this test can be used) for the duration of the COVID-19 declaration under Section 564(b)(1) of the Act, 21 U.S.C. section 360bbb-3(b)(1), unless the authorization is terminated or revoked.     Resp Syncytial Virus by PCR NEGATIVE NEGATIVE Final    Comment: (NOTE) Fact Sheet for Patients: BloggerCourse.com  Fact Sheet for Healthcare Providers: SeriousBroker.it  This test is not yet approved or cleared by the [place]  FDA and has been authorized for detection and/or diagnosis of SARS-CoV-2 by FDA under an Emergency Use Authorization (EUA). This EUA will remain in effect (meaning this test can be used) for the duration of the COVID-19 declaration under  Section 564(b)(1) of the Act, 21 U.S.C. section 360bbb-3(b)(1), unless the authorization is terminated or revoked.  Performed at [facility], [address] [place], [place] [address]     Labs: CBC: Recent Labs  Lab 01/14/24 1339 01/15/24 0459 01/16/24 0814  WBC 10.9* 8.7 20.0*  HGB 11.5* 10.5* 10.2*  HCT 38.5 33.4* 32.4*  MCV 73.3* 70.8* 71.2*  PLT 360 410* 468*   Basic Metabolic Panel: Recent Labs  Lab 01/14/24 1339 01/15/24 0459 01/16/24 0814  NA 139 134* 134*  K 4.1 3.6 4.0  CL 100 97* 99  CO2 27 26 26   GLUCOSE 131* 351* 242*  BUN 20 22 29*  CREATININE 0.81 0.83 0.85  CALCIUM  8.4* 8.0* 8.2*  Liver Function Tests: Recent Labs  Lab 01/16/24 0814  AST 22  ALT 18  ALKPHOS 57  BILITOT 0.5  PROT 5.7*  ALBUMIN 2.4*   CBG: No results for input(s): "GLUCAP" in the last 168 hours.  Discharge time spent: 32 minutes.  Signed: Marian Meneely, DO [facility] 01/17/2024

## 2024-01-18 ENCOUNTER — Ambulatory Visit: Payer: Self-pay

## 2024-01-18 NOTE — Telephone Encounter (Signed)
 Copied from CRM [id number]. Topic: Clinical - Red Word Triage >> Jan 18, 2024 10:20 AM Juliana Ocean wrote: Red Word that prompted transfer to Nurse Triage: pt c/d from hospital yesterday.  Copd exacerbation.  Pt needs hospital this month.   Patient daughter  [name] called stating the patient was discharged form the hospital yesterday after being admitted for COPD exacerbation and was advised to have the patient seen by Dr. Auston Left for a follow up this month. No appointments available with Dr. Duffy Gianotti until August. She states that the patient's symptoms are improved and that she was prescribed Prednisone  at discharge. Please call the patient's daughter at [phone] to assist with a hospital follow up appointment.    Reason for Disposition  [1] Condition / symptoms BETTER (improving) AND [2] caller has additional questions triager can answer    Patient advised to have hospital follow-up appointment  Answer Assessment - Initial Assessment Questions 1. MAIN CONCERN OR SYMPTOM:  "What is your main concern right now?" "What question do you have?" "What's the main symptom you're worried about?" (e.g., breathing difficulty, ankle swelling, weight gain.)     Difficulty breathing  2. ONSET: "When did the difficulty breathing start?"     Last week 3. BETTER-SAME-WORSE: "Are you getting better, staying the same, or getting worse compared to the day you were discharged?"     Better 4. HOSPITALIZATION: "How long were you hospitalized?" (e.g., days)     4 days  5. DISCHARGE DIAGNOSIS:  "What problem or disease were you hospitalized for?"     COPD exacerbation  6. DISCHARGE DATE: "What date were you discharged from the hospital?"     01/17/24 7. DISCHARGE DOCTOR: "Who is the main doctor taking care of you now?"     Dr. Dezaii 8. DISCHARGE APPOINTMENT: "Have you scheduled a follow-up discharge appointment with your doctor?"     No 9. DISCHARGE MEDICINES: "Did the doctor (or NP/PA) who discharged you order any new  medicines for you to use? If yes, have you filled the prescription and started taking the medicine?"      Prednisone   10. PAIN: "Is there any pain?" If Yes, ask: "How bad is it?"  (Scale 0-10; or mild, moderate, severe)   - NONE (0): no pain   - MILD (1-3): doesn't interfere with normal activities   - MODERATE (4-7): interferes with normal activities (e.g., work or school) or awakens from sleep   - SEVERE (8-10): excruciating pain, unable to do any normal activities       No 11. FEVER: "Do you have a fever?" If Yes, ask: "What is it, how was it measured  and when did it start?"       No 12. OTHER SYMPTOMS: "Do you have any other symptoms?"       No  Protocols used: Post-Hospitalization Follow-up Call-A-AH

## 2024-01-19 DIAGNOSIS — J441 Chronic obstructive pulmonary disease with (acute) exacerbation: Secondary | ICD-10-CM

## 2024-01-19 DIAGNOSIS — Z7901 Long term (current) use of anticoagulants: Secondary | ICD-10-CM

## 2024-01-19 DIAGNOSIS — R531 Weakness: Secondary | ICD-10-CM

## 2024-01-19 DIAGNOSIS — J4489 Other specified chronic obstructive pulmonary disease: Secondary | ICD-10-CM

## 2024-01-19 DIAGNOSIS — Z9981 Dependence on supplemental oxygen: Secondary | ICD-10-CM

## 2024-01-19 DIAGNOSIS — Z5982 Transportation insecurity: Secondary | ICD-10-CM

## 2024-01-19 DIAGNOSIS — Z7902 Long term (current) use of antithrombotics/antiplatelets: Secondary | ICD-10-CM

## 2024-01-19 DIAGNOSIS — R5383 Other fatigue: Secondary | ICD-10-CM

## 2024-01-19 DIAGNOSIS — Z9181 History of falling: Secondary | ICD-10-CM

## 2024-01-19 DIAGNOSIS — Z7951 Long term (current) use of inhaled steroids: Secondary | ICD-10-CM

## 2024-01-19 DIAGNOSIS — I5033 Acute on chronic diastolic (congestive) heart failure: Secondary | ICD-10-CM

## 2024-01-19 DIAGNOSIS — I480 Paroxysmal atrial fibrillation: Secondary | ICD-10-CM

## 2024-01-19 DIAGNOSIS — H538 Other visual disturbances: Secondary | ICD-10-CM

## 2024-01-20 ENCOUNTER — Ambulatory Visit

## 2024-01-20 DIAGNOSIS — Z7951 Long term (current) use of inhaled steroids: Secondary | ICD-10-CM | POA: Diagnosis not present

## 2024-01-20 DIAGNOSIS — I480 Paroxysmal atrial fibrillation: Secondary | ICD-10-CM | POA: Diagnosis not present

## 2024-01-20 DIAGNOSIS — R531 Weakness: Secondary | ICD-10-CM | POA: Diagnosis not present

## 2024-01-20 DIAGNOSIS — J441 Chronic obstructive pulmonary disease with (acute) exacerbation: Secondary | ICD-10-CM | POA: Diagnosis not present

## 2024-01-20 DIAGNOSIS — Z5982 Transportation insecurity: Secondary | ICD-10-CM | POA: Diagnosis not present

## 2024-01-20 DIAGNOSIS — Z9981 Dependence on supplemental oxygen: Secondary | ICD-10-CM | POA: Diagnosis not present

## 2024-01-20 DIAGNOSIS — I5033 Acute on chronic diastolic (congestive) heart failure: Secondary | ICD-10-CM | POA: Diagnosis not present

## 2024-01-20 DIAGNOSIS — H538 Other visual disturbances: Secondary | ICD-10-CM | POA: Diagnosis not present

## 2024-01-20 DIAGNOSIS — R5383 Other fatigue: Secondary | ICD-10-CM | POA: Diagnosis not present

## 2024-01-20 DIAGNOSIS — J4489 Other specified chronic obstructive pulmonary disease: Secondary | ICD-10-CM | POA: Diagnosis not present

## 2024-01-20 DIAGNOSIS — Z7902 Long term (current) use of antithrombotics/antiplatelets: Secondary | ICD-10-CM | POA: Diagnosis not present

## 2024-01-20 DIAGNOSIS — Z7901 Long term (current) use of anticoagulants: Secondary | ICD-10-CM | POA: Diagnosis not present

## 2024-01-21 ENCOUNTER — Other Ambulatory Visit: Payer: Self-pay

## 2024-01-21 NOTE — Patient Outreach (Signed)
 Complex Care Management   Visit Note  01/21/2024  Name:  Molly Hodges MRN: [id number] DOB: 24-Jul-1932  Situation: Referral received for Complex Care Management related to Heart Failure, COPD, and asthma I obtained verbal consent from Patient.  Visit completed with Pedro Bourgeois  on the phone  Background:   Past Medical History:  Diagnosis Date   Aortic atherosclerosis (HCC)    a. 05/2021 TEE: GrIII atheroma plaque involving the asc, transverse, and desc Ao.   Asthma    CAD (coronary artery disease)    a. 04/2021 Cath: LM nl, LAD 85p/m, D1 80, RI nl, LCX nl, RCA nl; b. 07/2021 PCI: pLAD (2.75x26 Onyx Frontier DES), D1 (2.5x22 Onyx Frontier DES).   Carotid artery disease (HCC)    a. s/p R carotid stenting (9mm x 7mm x 4cm long Exact stent); b. 06/2021 U/S: RICA 40-59%, LICA 40-59%.   CHF (congestive heart failure) (HCC)    Community acquired pneumonia    Essential hypertension    Glaucoma    Hyperlipidemia    Mitral regurgitation    a. TTE 08/2015: EF 60-65%, normal wall motion, mild MR, mildly dilated left atrium measuring 40 mm, RVSF normal, PASP normal; b. 05/2021 TEE: Moderate MR.   Mitral stenosis    a. 05/2021 L/RHC: Sev MS w/ mean grad 13-52mmHg and MV area of 0.5-06.cm^2; b. 05/2021 TEE: EF 55-60%, no rwma, nl RV fxn, mod MR, mod MS (MV area by P1/2t: 1.61 cm^2 w/ mean grad of ).   Peripheral neuropathy    Persistent atrial fibrillation (HCC) 09/17/2015   a. s/p DCCV 11/15/2015; b. CHADS2VASc => 4 (HTN, age x 2, female)--> warfarin; c. 06/2021 recurrent afib-->amio added.   Squamous cell carcinoma of skin 12/18/2021   R dorsum hand, EDC   Squamous cell carcinoma of skin 12/31/2021   R hand dorsum, recurrent - excised 02/04/2022   Squamous cell carcinoma of skin 12/31/2021   L forearm - ED&C   Squamous cell carcinoma of skin 06/24/2022   R thumb webspace with wart virus - ED&C   Squamous cell carcinoma of skin 10/20/2022   L lat knee - tx with ED&C     Assessment: Patient Reported Symptoms:  Cognitive Cognitive Status: Able to follow simple commands, Alert and oriented to person, place, and time, Normal speech and language skills Cognitive/Intellectual Conditions Management [RPT]: None reported or documented in medical history or problem list      Neurological Neurological Review of Symptoms: Not assessed    HEENT HEENT Symptoms Reported: Not assessed      Cardiovascular Cardiovascular Symptoms Reported: No symptoms reported Does patient have uncontrolled Hypertension?: No Cardiovascular Conditions: Hypertension, Dysrhythmia, High blood cholesterol, Heart failure, Valvular disease Cardiovascular Management Strategies: Weight management Do You Have a Working Readable Scale?: Yes Weight: 113 lb 9.6 oz (51.5 kg) (Patient reported) Cardiovascular Comment: Patient has visit with HF clinic 01/25/24. She is aware of upcoming appointment. Instructed to discuss Plavix  as advised by d/c paperwork.  Respiratory Respiratory Symptoms Reported: Shortness of breath (Patient feels that shortness of breath is at baseline. She has been able to ambulate around the house this morning.) Additional Respiratory Details: Patient reports her breathing is doing good today. She takes her breathing treatments as ordered in the morning. She continues on PO Prednisone . Since previous visit, patient has picked up Xopenex  and is using instead of Albuterol . Respiratory Conditions: Asthma, COPD Respiratory Comment: Patient has f/u with Pulmonology 02/03/24. She is aware of upcoming appointment.  Endocrine Patient reports  the following symptoms related to hypoglycemia or hyperglycemia : Not assessed    Gastrointestinal Gastrointestinal Symptoms Reported: Not assessed      Genitourinary Genitourinary Symptoms Reported: Not assessed    Integumentary Integumentary Symptoms Reported: Not assessed    Musculoskeletal Musculoskelatal Symptoms Reviewed: Not assessed         Psychosocial Psychosocial Symptoms Reported: Not assessed            12/30/2023   10:23 AM  Depression screen PHQ 2/9  Decreased Interest 0  Down, Depressed, Hopeless 0  PHQ - 2 Score 0    There were no vitals filed for this visit.  Medications Reviewed Today     Reviewed by Valaria Garland, RN (Registered Nurse) on 01/21/24 at 1147  Med List Status: <None>   Medication Order Taking? Sig Documenting Provider Last Dose Status Informant  arformoterol  (BROVANA ) 15 MCG/2ML NEBU [id number]  Take 2 mLs (15 mcg total) by nebulization 2 (two) times daily. Kasa, Kurian, MD  Active Self, Child, Pharmacy Records  budesonide  (PULMICORT ) 0.5 MG/2ML nebulizer solution [id number] No Take 2 mLs (0.5 mg total) by nebulization 2 (two) times daily. Kasa, Kurian, MD Taking Active Self, Child, Pharmacy Records  cholecalciferol  (VITAMIN D ) 1000 UNITS tablet [id number] No Take 1,000 Units by mouth daily. [provider] Taking Active Self, Child, Pharmacy Records  diltiazem  (CARDIZEM  CD) 120 MG 24 hr capsule [id number] No TAKE 1 CAPSULE BY MOUTH EVERY DAY Gollan, Timothy J, MD Taking Active Child, Pharmacy Records, Self  dorzolamide  (TRUSOPT ) 2 % ophthalmic solution [id number] No Place 1 drop into both eyes 2 (two) times daily.  [provider] Taking Active Self, Child, Pharmacy Records  ezetimibe  (ZETIA ) 10 MG tablet [id number] No TAKE 1 TABLET BY MOUTH DAILY Gollan, Timothy J, MD Taking Active Self, Child, Pharmacy Records  ferrous sulfate 325 (65 FE) MG EC tablet [id number]  Take 1 tablet (325 mg total) by mouth daily with breakfast. Roise Cleaver, DO  Active   ipratropium-albuterol  (DUONEB) 0.5-2.5 (3) MG/3ML SOLN [id number] No TAKE BY NEBULIZATION EVERY 4 HOURS AS NEEDED  Patient not taking: Reported on 01/14/2024   Lamon Pillow, MD Not Taking Active Self, Child, Pharmacy Records           Med Note Moriches, NANCY J   Wed Dec 16, 2023  2:28 AM) Pt prefers not to use because  of side effects (heart racing).  latanoprost  (XALATAN ) 0.005 % ophthalmic solution [id number] No Place 1 drop into both eyes at bedtime.  [provider] Taking Active Self, Child, Pharmacy Records  levalbuterol  (XOPENEX  HFA) 45 MCG/ACT inhaler [id number] No Inhale 2 puffs into the lungs every 6 (six) hours as needed for wheezing. Lamon Pillow, MD Taking Active Child, Pharmacy Records, Self  nitroGLYCERIN  (NITROSTAT ) 0.4 MG SL tablet [id number] No Place 1 tablet (0.4 mg total) under the tongue every 5 (five) minutes as needed (for chest pain or shortness of breath). Morey Ar, NP Taking Active Self, Child, Pharmacy Records  potassium chloride  (KLOR-CON ) 10 MEQ tablet [id number] No TAKE 2 TABLETS BY MOUTH DAILY. TAKE EXTRA 2 TABLETS WHEN TAKING EXTRA LASIX . Gollan, Timothy J, MD Taking Active Self, Child, Pharmacy Records  predniSONE  (DELTASONE ) 20 MG tablet [id number]  Take 2 tablets (40 mg total) by mouth daily with breakfast for 2 days, THEN 1 tablet (20 mg total) daily with breakfast for 2 days, THEN 0.5 tablets (10 mg total) daily with breakfast for 2 days. Dezii, Alexandra, DO  Active   pregabalin  (  LYRICA ) 50 MG capsule [id number] No TAKE 1 CAPSULE BY MOUTH 2 TIMES DAILY Fisher, Erlinda Haws, MD Taking Active Self, Child, Pharmacy Records  rosuvastatin  (CRESTOR ) 20 MG tablet [id number] No TAKE 1 TABLET BY MOUTH DAILY Gollan, Timothy J, MD Taking Active Self, Child, Pharmacy Records  torsemide  (DEMADEX ) 20 MG tablet [id number] No Take 1 tablet (20 mg total) by mouth daily. You may take additional dose (20 mg tab) for 3 pound gain overnight or 5 pound gain in 1 week. Morey Ar, NP Taking Active Self, Child, Pharmacy Records  vitamin C  (ASCORBIC ACID ) 500 MG tablet [id number]  Take 500 mg by mouth daily. [provider]  Active Self, Child, Pharmacy Records  warfarin (COUMADIN ) 2.5 MG tablet [id number] No TAKE 1 & 1/2 TABLETS DAILY EXCEPT TAKE ONE TABLET ON MONDAYS,  WEDNESDAYS, AND FRIDAYS OR AS DIRECTED BY COUMADIN  CLINIC Devorah Fonder, MD 01/14/2024 Evening Active Self, Child, Pharmacy Records            Recommendation:   Specialty provider follow-up heart failure clinic (01/25/24), pulmonology (02/03/24) Continue Current Plan of Care  Follow Up Plan:   Telephone follow up appointment date/time:  02/08/24 at 10:30 AM with Sarrah Cure, RN MSN [place]  [facility] RN Care Manager Direct Dial: [phone]  Fax: [phone]

## 2024-01-21 NOTE — Patient Instructions (Signed)
 Visit Information  Thank you for taking time to visit with me today. Please don't hesitate to contact me if I can be of assistance to you before our next scheduled appointment.  Your next care management appointment is by telephone on 02/08/24 at 10:30 AM with Michele Ahle, who is the nurse care manager covering Dr. Evans [facility] clinic.  Please call the care guide team at [phone] if you need to cancel, schedule, or reschedule an appointment.   Please call the Suicide and Crisis Lifeline: 988 call 1-800-273-TALK (toll free, 24 hour hotline) if you are experiencing a Mental Health or Behavioral Health Crisis or need someone to talk to.  Theodora Fish, RN MSN [place]  [facility] RN Care Manager Direct Dial: [phone]  Fax: [phone]

## 2024-01-22 ENCOUNTER — Telehealth: Payer: Self-pay | Admitting: Family

## 2024-01-22 NOTE — Telephone Encounter (Signed)
 Called to confirm/remind patient of their appointment at the [facility] on 01/25/24.   Appointment:   [x] Confirmed  [] Left mess   [] No answer/No voice mail  [] VM Full/unable to leave message  [] Phone not in service  Patient reminded to bring all medications and/or complete list.  Confirmed patient has transportation. Gave directions, instructed to utilize valet parking.

## 2024-01-22 NOTE — Progress Notes (Signed)
 Advanced [facility] Failure Clinic Note    PCP: Lamon Pillow, MD (last seen 05/25) Cardiologist: Belva Boyden, MD / Morey Ar, NP (last seen 02/25)  Chief Complaint: shortness of breath   HPI:  Molly Hodges is a 89 y/o female with a history of asthma, carotid stenosis, CAD, hyperlipidemia, PAD, HTN, COPD, glaucoma, hypothyroidism, mitral stenosis, atrial fibrillation and chronic heart failure.   Diagnosed with A-fib January 2017 in the setting of pneumonia and UTI. Echo at that time showed normal LV function with moderately dilated left atrium. She was anticoagulated with Eliquis  and underwent cardioversion.   Underwent R/LHC in September 2022 which demonstrated severe mitral stenosis and moderate MR as well as severe single-vessel CAD in the LAD. Referred to CT surgery and underwent TEE which demonstrated moderate mitral stenosis with moderate MR. Given these findings she was scheduled to undergo PCI of DES on 08/02/2021. She tolerated the procedure well and was discharged home.   Admitted 12/22 by EMS. Initial labs showed BNP 555, troponin 1505>> 1284, D-dimer 12.17. EKG showed rate controlled A-fib without acute changes. CTA was negative for PE however lower lobe mucous plugging was noted with peribronchial thickening and patchy groundglass density concerning for atelectasis versus mild edema versus combination of the 2. She underwent LHC which showed patent stents (further details above). Her A-fib has been managed with amiodarone  and diltiazem  with Coumadin  for stroke prophylaxis.   Echo 09/03/23: EF 55-60%, normal RV, moderate LAE, mild/ mod MR, mod/ severe Molly  Admitted 12/13/23 with SOB X 3 days. BO 157/94. Placed on oxygen  @ 2L due to being tachypneic. WBC elevated 15.2. CXR with possible right pleural effusion. Started on lasix . + rhinovirus which exacerbated her asthma.   Admitted 12/15/23 due to SOB. Woke up unable to breath without improvement w/ inhalers. IV steroids  given along with bronchodilators. CTA chest negative for PE / pneumonia.   Seen in the [facility] 05/25 and was taken to the ER due to worsening symptoms. She was admitted with COPD / HF exacerbation. IV solumedrol given with transition to prednisone  taper. Viral panel negative. IV lasix  given.   She presents today, with her friend, for a hospital follow-up visit with a chief complaint of shortness of breath with minimal exertion. Has associated fatigue, palpitations, weakness, dizziness and feeling jittery. Denies chest pain, cough, abdominal distention, pedal edema. She thinks that she has finished her prednisone  taper.   ROS: All systems negative except what is listed in HPI, PMH and Problem List   Past Medical History:  Diagnosis Date   Aortic atherosclerosis (HCC)    a. 05/2021 TEE: GrIII atheroma plaque involving the asc, transverse, and desc Ao.   Asthma    CAD (coronary artery disease)    a. 04/2021 Cath: LM nl, LAD 85p/m, D1 80, RI nl, LCX nl, RCA nl; b. 07/2021 PCI: pLAD (2.75x26 Onyx Frontier DES), D1 (2.5x22 Onyx Frontier DES).   Carotid artery disease (HCC)    a. s/p R carotid stenting (9mm x 7mm x 4cm long Exact stent); b. 06/2021 U/S: RICA 40-59%, LICA 40-59%.   CHF (congestive heart failure) (HCC)    Community acquired pneumonia    Essential hypertension    Glaucoma    Hyperlipidemia    Mitral regurgitation    a. TTE 08/2015: EF 60-65%, normal wall motion, mild MR, mildly dilated left atrium measuring 40 mm, RVSF normal, PASP normal; b. 05/2021 TEE: Moderate MR.   Mitral stenosis    a. 05/2021 L/RHC: Sev Molly w/  mean grad 13-26mmHg and MV area of 0.5-06.cm^2; b. 05/2021 TEE: EF 55-60%, no rwma, nl RV fxn, mod MR, mod Molly (MV area by P1/2t: 1.61 cm^2 w/ mean grad of ).   Peripheral neuropathy    Persistent atrial fibrillation (HCC) 09/17/2015   a. s/p DCCV 11/15/2015; b. CHADS2VASc => 4 (HTN, age x 2, female)--> warfarin; c. 06/2021 recurrent afib-->amio added.   Squamous cell  carcinoma of skin 12/18/2021   R dorsum hand, EDC   Squamous cell carcinoma of skin 12/31/2021   R hand dorsum, recurrent - excised 02/04/2022   Squamous cell carcinoma of skin 12/31/2021   L forearm - ED&C   Squamous cell carcinoma of skin 06/24/2022   R thumb webspace with wart virus - ED&C   Squamous cell carcinoma of skin 10/20/2022   L lat knee - tx with ED&C    Current Outpatient Medications  Medication Sig Dispense Refill   arformoterol  (BROVANA ) 15 MCG/2ML NEBU Take 2 mLs (15 mcg total) by nebulization 2 (two) times daily. 120 mL 5   budesonide  (PULMICORT ) 0.5 MG/2ML nebulizer solution Take 2 mLs (0.5 mg total) by nebulization 2 (two) times daily. 1440 mL 0   cholecalciferol  (VITAMIN D ) 1000 UNITS tablet Take 1,000 Units by mouth daily.     diltiazem  (CARDIZEM  CD) 120 MG 24 hr capsule TAKE 1 CAPSULE BY MOUTH EVERY DAY 90 capsule 3   dorzolamide  (TRUSOPT ) 2 % ophthalmic solution Place 1 drop into both eyes 2 (two) times daily.      ezetimibe  (ZETIA ) 10 MG tablet TAKE 1 TABLET BY MOUTH DAILY 90 tablet 2   ferrous sulfate  325 (65 FE) MG EC tablet Take 1 tablet (325 mg total) by mouth daily with breakfast. 90 tablet 3   ipratropium-albuterol  (DUONEB) 0.5-2.5 (3) MG/3ML SOLN TAKE BY NEBULIZATION EVERY 4 HOURS AS NEEDED (Patient not taking: Reported on 01/14/2024) 360 mL 1   latanoprost  (XALATAN ) 0.005 % ophthalmic solution Place 1 drop into both eyes at bedtime.      levalbuterol  (XOPENEX  HFA) 45 MCG/ACT inhaler Inhale 2 puffs into the lungs every 6 (six) hours as needed for wheezing. 1 each 3   nitroGLYCERIN  (NITROSTAT ) 0.4 MG SL tablet Place 1 tablet (0.4 mg total) under the tongue every 5 (five) minutes as needed (for chest pain or shortness of breath). 25 tablet 3   potassium chloride  (KLOR-CON ) 10 MEQ tablet TAKE 2 TABLETS BY MOUTH DAILY. TAKE EXTRA 2 TABLETS WHEN TAKING EXTRA LASIX . 200 tablet 2   predniSONE  (DELTASONE ) 20 MG tablet Take 2 tablets (40 mg total) by mouth daily  with breakfast for 2 days, THEN 1 tablet (20 mg total) daily with breakfast for 2 days, THEN 0.5 tablets (10 mg total) daily with breakfast for 2 days. 7 tablet 0   pregabalin  (LYRICA ) 50 MG capsule TAKE 1 CAPSULE BY MOUTH 2 TIMES DAILY 60 capsule 5   rosuvastatin  (CRESTOR ) 20 MG tablet TAKE 1 TABLET BY MOUTH DAILY 90 tablet 3   torsemide  (DEMADEX ) 20 MG tablet Take 1 tablet (20 mg total) by mouth daily. You may take additional dose (20 mg tab) for 3 pound gain overnight or 5 pound gain in 1 week. 135 tablet 3   vitamin C  (ASCORBIC ACID ) 500 MG tablet Take 500 mg by mouth daily.     warfarin (COUMADIN ) 2.5 MG tablet TAKE 1 & 1/2 TABLETS DAILY EXCEPT TAKE ONE TABLET ON MONDAYS, WEDNESDAYS, AND FRIDAYS OR AS DIRECTED BY COUMADIN  CLINIC 40 tablet 3  No current facility-administered medications for this visit.    Allergies  Allergen Reactions   Shellfish Allergy Anaphylaxis    Other reaction(s): Hallucination   Azithromycin  Other (See Comments)    Extreme burning sensation at IV site    Tamiflu [Oseltamivir Phosphate] Other (See Comments)    Reaction:  Hallucinations     Albuterol  Palpitations    Heart racing.    Albuterol  And Levalbuterol  Palpitations      Social History   Socioeconomic History   Marital status: Married    Spouse name: Gwen Lek   Number of children: 4   Years of education: Not on file   Highest education level: 12th grade  Occupational History   Occupation: retired  Tobacco Use   Smoking status: Never   Smokeless tobacco: Never   Tobacco comments:    never   Vaping Use   Vaping status: Never Used  Substance and Sexual Activity   Alcohol use: No   Drug use: Never   Sexual activity: Not on file  Other Topics Concern   Not on file  Social History Narrative   Lives at home with Husband. Active and Independent at baseline.   Social Drivers of Corporate investment banker Strain: Low Risk  (01/15/2024)   Overall Financial Resource Strain (CARDIA)     Difficulty of Paying Living Expenses: Not hard at all  Food Insecurity: No Food Insecurity (01/15/2024)   Hunger Vital Sign    Worried About Running Out of Food in the Last Year: Never true    Ran Out of Food in the Last Year: Never true  Transportation Needs: No Transportation Needs (01/15/2024)   PRAPARE - Administrator, Civil Service (Medical): No    Lack of Transportation (Non-Medical): No  Physical Activity: Insufficiently Active (12/29/2023)   Exercise Vital Sign    Days of Exercise per Week: 2 days    Minutes of Exercise per Session: 60 min  Stress: No Stress Concern Present (12/29/2023)   [employer] of Occupational Health - Occupational Stress Questionnaire    Feeling of Stress : Not at all  Social Connections: Unknown (01/16/2024)   Social Connection and Isolation Panel [NHANES]    Frequency of Communication with Friends and Family: Twice a week    Frequency of Social Gatherings with Friends and Family: Twice a week    Attends Religious Services: Patient declined    Database administrator or Organizations: Patient declined    Attends Banker Meetings: Patient declined    Marital Status: Patient declined  Recent Concern: Social Connections - Moderately Isolated (12/29/2023)   Social Connection and Isolation Panel [NHANES]    Frequency of Communication with Friends and Family: More than three times a week    Frequency of Social Gatherings with Friends and Family: More than three times a week    Attends Religious Services: Never    Database administrator or Organizations: No    Attends Banker Meetings: Never    Marital Status: Married  Catering manager Violence: Not At Risk (01/16/2024)   Humiliation, Afraid, Rape, and Kick questionnaire    Fear of Current or Ex-Partner: No    Emotionally Abused: No    Physically Abused: No    Sexually Abused: No      Family History  Problem Relation Age of Onset   CAD Mother    CAD Father     Cancer Son 76       lung  cancer    Vitals:   01/25/24 1029  BP: (!) 97/53  Pulse: 88  SpO2: 100%  Weight: 113 lb 8 oz (51.5 kg)   Wt Readings from Last 3 Encounters:  01/25/24 113 lb 8 oz (51.5 kg)  01/21/24 113 lb 9.6 oz (51.5 kg)  01/14/24 116 lb (52.6 kg)   Lab Results  Component Value Date   CREATININE 0.85 01/16/2024   CREATININE 0.83 01/15/2024   CREATININE 0.81 01/14/2024    PHYSICAL EXAM:  General: Frail appearing. No resp difficulty HEENT: normal Neck: supple, no JVD Cor: Irregular rhythm, rate. No rubs, gallops or murmurs Lungs: clear Abdomen: soft, nontender, nondistended. Extremities: no cyanosis, clubbing, rash, edema Neuro: alert & oriented X 3. Moves all 4 extremities w/o difficulty. Affect pleasant   ECG: not done   ASSESSMENT & PLAN:  1: Chronic heart failure with preserved ejection fraction- - suspect due to mitral stenosis; very deconditioned  - NYHA class III - euvolemic today - weighing daily; reminded to call for overnight weight gain of > 2 pounds or weekly weight gain of > 5 pounds - weight down 3 pounds from last visit here 10 days ago - not adding salt to her foods - Echo 09/03/23: EF 55-60%, normal RV, moderate LAE, mild/ mod MR, mod/ severe Molly - continue potassium 20meq daily - continue torsemide  20mg  daily and additional 20mg  PRN - BP limits MRA or further diuretic titration - SGLT2 may not be an option due to hypotension - BNP 01/14/24 was 488.3  2: HTN- - BP 97/53 - saw PCP Shann Darnel) 05/25  - BMP 01/15/24 reviewed: sodium 134 potassium 3.6, creatinine 0.83 and GFR >60  3: Paroxysmal atrial fibrillation- - DCCV 03/17 - saw cardiology ([place]) 02/25 - continue cardizem  & warfarin - currently rate controlled  4: COPD- - using nebulizers PRN - saw pulmonology Auston Left) 03/25 - has oxygen  at home but doesn't wear it  Have messaged vascular ([facility]) asking him to address whether patient needs to be on plavix  for her carotid  disease as she says that she was told "by someone" at some point that she didn't need to take it. Have asked his office to follow up with patient.   Lengthy discussion about palliative care, services they provide and the potential to transition to hospice care at some point. Patient voices understanding and is agreeable to palliative referral. This was placed today.    Keep already scheduled appt 07/25, sooner if needed.   Charlette Console, FNP 01/22/24

## 2024-01-25 ENCOUNTER — Encounter: Payer: Self-pay | Admitting: Family

## 2024-01-25 ENCOUNTER — Ambulatory Visit: Attending: Family | Admitting: Family

## 2024-01-25 VITALS — BP 97/53 | HR 88 | Wt 113.5 lb

## 2024-01-25 DIAGNOSIS — Z8249 Family history of ischemic heart disease and other diseases of the circulatory system: Secondary | ICD-10-CM | POA: Insufficient documentation

## 2024-01-25 DIAGNOSIS — J449 Chronic obstructive pulmonary disease, unspecified: Secondary | ICD-10-CM

## 2024-01-25 DIAGNOSIS — I5032 Chronic diastolic (congestive) heart failure: Secondary | ICD-10-CM | POA: Diagnosis not present

## 2024-01-25 DIAGNOSIS — E785 Hyperlipidemia, unspecified: Secondary | ICD-10-CM | POA: Diagnosis not present

## 2024-01-25 DIAGNOSIS — Z79899 Other long term (current) drug therapy: Secondary | ICD-10-CM | POA: Diagnosis not present

## 2024-01-25 DIAGNOSIS — Z7901 Long term (current) use of anticoagulants: Secondary | ICD-10-CM | POA: Insufficient documentation

## 2024-01-25 DIAGNOSIS — J4489 Other specified chronic obstructive pulmonary disease: Secondary | ICD-10-CM | POA: Diagnosis not present

## 2024-01-25 DIAGNOSIS — I10 Essential (primary) hypertension: Secondary | ICD-10-CM | POA: Diagnosis not present

## 2024-01-25 DIAGNOSIS — H409 Unspecified glaucoma: Secondary | ICD-10-CM | POA: Diagnosis not present

## 2024-01-25 DIAGNOSIS — I2510 Atherosclerotic heart disease of native coronary artery without angina pectoris: Secondary | ICD-10-CM | POA: Insufficient documentation

## 2024-01-25 DIAGNOSIS — I110 Hypertensive heart disease with heart failure: Secondary | ICD-10-CM | POA: Insufficient documentation

## 2024-01-25 DIAGNOSIS — E039 Hypothyroidism, unspecified: Secondary | ICD-10-CM | POA: Insufficient documentation

## 2024-01-25 DIAGNOSIS — I052 Rheumatic mitral stenosis with insufficiency: Secondary | ICD-10-CM | POA: Diagnosis not present

## 2024-01-25 DIAGNOSIS — I480 Paroxysmal atrial fibrillation: Secondary | ICD-10-CM | POA: Diagnosis not present

## 2024-01-27 ENCOUNTER — Ambulatory Visit: Attending: Cardiovascular Disease

## 2024-01-27 DIAGNOSIS — I050 Rheumatic mitral stenosis: Secondary | ICD-10-CM

## 2024-01-27 DIAGNOSIS — Z5181 Encounter for therapeutic drug level monitoring: Secondary | ICD-10-CM

## 2024-01-27 DIAGNOSIS — I480 Paroxysmal atrial fibrillation: Secondary | ICD-10-CM

## 2024-01-27 LAB — POCT INR: INR: 2.1 (ref 2.0–3.0)

## 2024-01-27 NOTE — Patient Instructions (Signed)
 Continue taking 1 tablet every day, EXCEPT 1.5 TABLETS ON TUESDAYS, THURSDAYS, AND SUNDAYS.  EAT 2 HELPINGS OF GREENS PER WEEK.   Recheck INR in 6 weeks;  [phone]

## 2024-01-28 ENCOUNTER — Ambulatory Visit: Payer: Self-pay

## 2024-01-28 ENCOUNTER — Emergency Department

## 2024-01-28 ENCOUNTER — Inpatient Hospital Stay
Admission: EM | Admit: 2024-01-28 | Discharge: 2024-01-30 | DRG: 191 | Disposition: A | Discharge status: Home Health | Attending: Internal Medicine | Admitting: Internal Medicine

## 2024-01-28 ENCOUNTER — Other Ambulatory Visit: Payer: Self-pay

## 2024-01-28 ENCOUNTER — Telehealth: Payer: Self-pay

## 2024-01-28 DIAGNOSIS — Z1152 Encounter for screening for COVID-19: Secondary | ICD-10-CM

## 2024-01-28 DIAGNOSIS — I482 Chronic atrial fibrillation, unspecified: Secondary | ICD-10-CM | POA: Diagnosis present

## 2024-01-28 DIAGNOSIS — I700 Atherosclerosis of aorta: Secondary | ICD-10-CM | POA: Diagnosis present

## 2024-01-28 DIAGNOSIS — R0600 Dyspnea, unspecified: Principal | ICD-10-CM

## 2024-01-28 DIAGNOSIS — N179 Acute kidney failure, unspecified: Secondary | ICD-10-CM | POA: Diagnosis present

## 2024-01-28 DIAGNOSIS — I5032 Chronic diastolic (congestive) heart failure: Secondary | ICD-10-CM | POA: Diagnosis not present

## 2024-01-28 DIAGNOSIS — I2510 Atherosclerotic heart disease of native coronary artery without angina pectoris: Secondary | ICD-10-CM | POA: Diagnosis present

## 2024-01-28 DIAGNOSIS — I739 Peripheral vascular disease, unspecified: Secondary | ICD-10-CM | POA: Diagnosis present

## 2024-01-28 DIAGNOSIS — E039 Hypothyroidism, unspecified: Secondary | ICD-10-CM | POA: Diagnosis not present

## 2024-01-28 DIAGNOSIS — I351 Nonrheumatic aortic (valve) insufficiency: Secondary | ICD-10-CM | POA: Diagnosis present

## 2024-01-28 DIAGNOSIS — D72829 Elevated white blood cell count, unspecified: Secondary | ICD-10-CM | POA: Diagnosis present

## 2024-01-28 DIAGNOSIS — I959 Hypotension, unspecified: Secondary | ICD-10-CM | POA: Diagnosis not present

## 2024-01-28 DIAGNOSIS — J4521 Mild intermittent asthma with (acute) exacerbation: Secondary | ICD-10-CM

## 2024-01-28 DIAGNOSIS — Z801 Family history of malignant neoplasm of trachea, bronchus and lung: Secondary | ICD-10-CM

## 2024-01-28 DIAGNOSIS — Z9849 Cataract extraction status, unspecified eye: Secondary | ICD-10-CM

## 2024-01-28 DIAGNOSIS — I050 Rheumatic mitral stenosis: Secondary | ICD-10-CM | POA: Diagnosis present

## 2024-01-28 DIAGNOSIS — I10 Essential (primary) hypertension: Secondary | ICD-10-CM | POA: Diagnosis not present

## 2024-01-28 DIAGNOSIS — Z7901 Long term (current) use of anticoagulants: Secondary | ICD-10-CM

## 2024-01-28 DIAGNOSIS — E785 Hyperlipidemia, unspecified: Secondary | ICD-10-CM | POA: Diagnosis present

## 2024-01-28 DIAGNOSIS — Z85828 Personal history of other malignant neoplasm of skin: Secondary | ICD-10-CM

## 2024-01-28 DIAGNOSIS — T502X5A Adverse effect of carbonic-anhydrase inhibitors, benzothiadiazides and other diuretics, initial encounter: Secondary | ICD-10-CM | POA: Diagnosis present

## 2024-01-28 DIAGNOSIS — I2489 Other forms of acute ischemic heart disease: Secondary | ICD-10-CM | POA: Diagnosis present

## 2024-01-28 DIAGNOSIS — Z8249 Family history of ischemic heart disease and other diseases of the circulatory system: Secondary | ICD-10-CM

## 2024-01-28 DIAGNOSIS — I272 Pulmonary hypertension, unspecified: Secondary | ICD-10-CM | POA: Diagnosis present

## 2024-01-28 DIAGNOSIS — Z79899 Other long term (current) drug therapy: Secondary | ICD-10-CM

## 2024-01-28 DIAGNOSIS — I4819 Other persistent atrial fibrillation: Secondary | ICD-10-CM | POA: Diagnosis present

## 2024-01-28 DIAGNOSIS — D509 Iron deficiency anemia, unspecified: Secondary | ICD-10-CM | POA: Diagnosis present

## 2024-01-28 DIAGNOSIS — Z91013 Allergy to seafood: Secondary | ICD-10-CM

## 2024-01-28 DIAGNOSIS — J441 Chronic obstructive pulmonary disease with (acute) exacerbation: Principal | ICD-10-CM | POA: Diagnosis present

## 2024-01-28 DIAGNOSIS — R0602 Shortness of breath: Secondary | ICD-10-CM | POA: Diagnosis not present

## 2024-01-28 DIAGNOSIS — Z7952 Long term (current) use of systemic steroids: Secondary | ICD-10-CM

## 2024-01-28 DIAGNOSIS — I110 Hypertensive heart disease with heart failure: Secondary | ICD-10-CM | POA: Diagnosis present

## 2024-01-28 DIAGNOSIS — E1165 Type 2 diabetes mellitus with hyperglycemia: Secondary | ICD-10-CM | POA: Diagnosis not present

## 2024-01-28 DIAGNOSIS — J45901 Unspecified asthma with (acute) exacerbation: Secondary | ICD-10-CM | POA: Diagnosis present

## 2024-01-28 DIAGNOSIS — Z888 Allergy status to other drugs, medicaments and biological substances status: Secondary | ICD-10-CM

## 2024-01-28 DIAGNOSIS — H409 Unspecified glaucoma: Secondary | ICD-10-CM | POA: Diagnosis present

## 2024-01-28 DIAGNOSIS — I5A Non-ischemic myocardial injury (non-traumatic): Secondary | ICD-10-CM | POA: Diagnosis not present

## 2024-01-28 DIAGNOSIS — Z7951 Long term (current) use of inhaled steroids: Secondary | ICD-10-CM

## 2024-01-28 DIAGNOSIS — Z66 Do not resuscitate: Secondary | ICD-10-CM | POA: Diagnosis present

## 2024-01-28 DIAGNOSIS — E871 Hypo-osmolality and hyponatremia: Secondary | ICD-10-CM | POA: Diagnosis not present

## 2024-01-28 DIAGNOSIS — E872 Acidosis, unspecified: Secondary | ICD-10-CM | POA: Diagnosis present

## 2024-01-28 LAB — BASIC METABOLIC PANEL WITH GFR
Anion gap: 11 (ref 5–15)
BUN: 16 mg/dL (ref 8–23)
CO2: 27 mmol/L (ref 22–32)
Calcium: 7.8 mg/dL — ABNORMAL LOW (ref 8.9–10.3)
Chloride: 100 mmol/L (ref 98–111)
Creatinine, Ser: 0.63 mg/dL (ref 0.44–1.00)
GFR, Estimated: >60 mL/min (ref >60)
Glucose, Bld: 173 mg/dL — ABNORMAL HIGH (ref 70–99)
Potassium: 3.6 mmol/L (ref 3.5–5.1)
Sodium: 138 mmol/L (ref 135–145)

## 2024-01-28 LAB — CBC
HCT: 40.4 % (ref 36.0–46.0)
Hemoglobin: 12.2 g/dL (ref 12.0–15.0)
MCH: 22.4 pg — ABNORMAL LOW (ref 26.0–34.0)
MCHC: 30.2 g/dL (ref 30.0–36.0)
MCV: 74.1 fL — ABNORMAL LOW (ref 80.0–100.0)
Platelets: 220 10*3/uL (ref 150–400)
RBC: 5.45 MIL/uL — ABNORMAL HIGH (ref 3.87–5.11)
RDW: 21.1 % — ABNORMAL HIGH (ref 11.5–15.5)
WBC: 18.1 10*3/uL — ABNORMAL HIGH (ref 4.0–10.5)
nRBC: 0 % (ref 0.0–0.2)

## 2024-01-28 LAB — TROPONIN I (HIGH SENSITIVITY)
Troponin I (High Sensitivity): 20 ng/L — ABNORMAL HIGH (ref <18)
Troponin I (High Sensitivity): 16 ng/L
Troponin I (High Sensitivity): 14 ng/L (ref <18)

## 2024-01-28 LAB — BRAIN NATRIURETIC PEPTIDE: B Natriuretic Peptide: 537.4 pg/mL — ABNORMAL HIGH (ref 0.0–100.0)

## 2024-01-28 LAB — PROTIME-INR
INR: 2.4 — ABNORMAL HIGH (ref 0.8–1.2)
Prothrombin Time: 26.4 s — ABNORMAL HIGH (ref 11.4–15.2)

## 2024-01-28 LAB — RESP PANEL BY RT-PCR (RSV, FLU A&B, COVID)  RVPGX2
Influenza A by PCR: NEGATIVE
Influenza B by PCR: NEGATIVE
Resp Syncytial Virus by PCR: NEGATIVE
SARS Coronavirus 2 by RT PCR: NEGATIVE

## 2024-01-28 MED ORDER — HYDRALAZINE HCL 20 MG/ML IJ SOLN: 5.0000 mg | INTRAMUSCULAR | Status: DC | PRN

## 2024-01-28 MED ORDER — WARFARIN SODIUM 3 MG PO TABS
3.5000 mg | ORAL_TABLET | Freq: Once | ORAL | Status: AC
  Administered 2024-01-28: 3.5 mg via ORAL
  Filled 2024-01-28 (×2): qty 1

## 2024-01-28 MED ORDER — FUROSEMIDE 10 MG/ML IJ SOLN: 40.0000 mg | Freq: Once | INTRAMUSCULAR | Status: DC

## 2024-01-28 MED ORDER — METHYLPREDNISOLONE SODIUM SUCC 125 MG IJ SOLR
125.0000 mg | Freq: Once | INTRAMUSCULAR | Status: AC
  Administered 2024-01-28: 125 mg via INTRAVENOUS
  Filled 2024-01-28: qty 2

## 2024-01-28 MED ORDER — ROSUVASTATIN CALCIUM 20 MG PO TABS
20.0000 mg | ORAL_TABLET | Freq: Every day | ORAL | Status: DC
  Administered 2024-01-29 – 2024-01-30 (×2): 20 mg via ORAL
  Filled 2024-01-28 (×2): qty 1

## 2024-01-28 MED ORDER — VITAMIN D 25 MCG (1000 UNIT) PO TABS
1000.0000 [IU] | ORAL_TABLET | Freq: Every day | ORAL | Status: DC
  Administered 2024-01-28 – 2024-01-30 (×3): 1000 [IU] via ORAL
  Filled 2024-01-28 (×3): qty 1

## 2024-01-28 MED ORDER — LEVALBUTEROL HCL 1.25 MG/0.5ML IN NEBU: 1.2500 mg | INHALATION_SOLUTION | Freq: Four times a day (QID) | RESPIRATORY_TRACT | Status: DC | PRN

## 2024-01-28 MED ORDER — IPRATROPIUM BROMIDE 0.02 % IN SOLN: 0.5000 mg | Freq: Four times a day (QID) | RESPIRATORY_TRACT | Status: DC

## 2024-01-28 MED ORDER — IPRATROPIUM-ALBUTEROL 0.5-2.5 (3) MG/3ML IN SOLN
RESPIRATORY_TRACT | Status: AC
  Filled 2024-01-28: qty 3

## 2024-01-28 MED ORDER — FUROSEMIDE 10 MG/ML IJ SOLN
40.0000 mg | Freq: Once | INTRAMUSCULAR | Status: AC
  Administered 2024-01-28: 40 mg via INTRAVENOUS
  Filled 2024-01-28: qty 4

## 2024-01-28 MED ORDER — METHYLPREDNISOLONE SODIUM SUCC 125 MG IJ SOLR: 80.0000 mg | INTRAMUSCULAR | Status: DC

## 2024-01-28 MED ORDER — WARFARIN - PHARMACIST DOSING INPATIENT
Freq: Every day | Status: DC
  Filled 2024-01-28: qty 1

## 2024-01-28 MED ORDER — FERROUS SULFATE 325 (65 FE) MG PO TABS
325.0000 mg | ORAL_TABLET | Freq: Every day | ORAL | Status: DC
  Administered 2024-01-29 – 2024-01-30 (×2): 325 mg via ORAL
  Filled 2024-01-28 (×2): qty 1

## 2024-01-28 MED ORDER — ONDANSETRON HCL 4 MG/2ML IJ SOLN: 4.0000 mg | Freq: Three times a day (TID) | INTRAMUSCULAR | Status: DC | PRN

## 2024-01-28 MED ORDER — DILTIAZEM HCL ER COATED BEADS 120 MG PO CP24
120.0000 mg | ORAL_CAPSULE | Freq: Every day | ORAL | Status: DC
  Administered 2024-01-29: 120 mg via ORAL
  Filled 2024-01-28 (×2): qty 1

## 2024-01-28 MED ORDER — EZETIMIBE 10 MG PO TABS
10.0000 mg | ORAL_TABLET | Freq: Every day | ORAL | Status: DC
  Administered 2024-01-28 – 2024-01-30 (×3): 10 mg via ORAL
  Filled 2024-01-28 (×3): qty 1

## 2024-01-28 MED ORDER — VITAMIN C 500 MG PO TABS
500.0000 mg | ORAL_TABLET | Freq: Every day | ORAL | Status: DC
  Administered 2024-01-28 – 2024-01-30 (×3): 500 mg via ORAL
  Filled 2024-01-28 (×3): qty 1

## 2024-01-28 MED ORDER — MELATONIN 5 MG PO TABS
5.0000 mg | ORAL_TABLET | Freq: Every evening | ORAL | Status: DC | PRN
  Administered 2024-01-28 – 2024-01-29 (×2): 5 mg via ORAL
  Filled 2024-01-28 (×2): qty 1

## 2024-01-28 MED ORDER — LATANOPROST 0.005 % OP SOLN
1.0000 [drp] | Freq: Every day | OPHTHALMIC | Status: DC
  Administered 2024-01-29: 1 [drp] via OPHTHALMIC
  Filled 2024-01-28: qty 2.5

## 2024-01-28 MED ORDER — ACETAMINOPHEN 325 MG PO TABS: 650.0000 mg | ORAL_TABLET | Freq: Four times a day (QID) | ORAL | Status: DC | PRN

## 2024-01-28 MED ORDER — NITROGLYCERIN 0.4 MG SL SUBL: 0.4000 mg | SUBLINGUAL_TABLET | SUBLINGUAL | Status: DC | PRN

## 2024-01-28 MED ORDER — DM-GUAIFENESIN ER 30-600 MG PO TB12: 1.0000 | ORAL_TABLET | Freq: Two times a day (BID) | ORAL | Status: DC | PRN

## 2024-01-28 MED ORDER — IPRATROPIUM-ALBUTEROL 0.5-2.5 (3) MG/3ML IN SOLN
3.0000 mL | RESPIRATORY_TRACT | Status: DC
  Administered 2024-01-28 – 2024-01-29 (×4): 3 mL via RESPIRATORY_TRACT
  Filled 2024-01-28 (×4): qty 3

## 2024-01-28 MED ORDER — PREGABALIN 50 MG PO CAPS
50.0000 mg | ORAL_CAPSULE | Freq: Two times a day (BID) | ORAL | Status: DC
  Administered 2024-01-28 – 2024-01-30 (×4): 50 mg via ORAL
  Filled 2024-01-28 (×4): qty 1

## 2024-01-28 MED ORDER — TORSEMIDE 20 MG PO TABS
20.0000 mg | ORAL_TABLET | Freq: Every day | ORAL | Status: DC
  Administered 2024-01-29 – 2024-01-30 (×2): 20 mg via ORAL
  Filled 2024-01-28 (×2): qty 1

## 2024-01-28 MED ORDER — IPRATROPIUM-ALBUTEROL 0.5-2.5 (3) MG/3ML IN SOLN
9.0000 mL | Freq: Once | RESPIRATORY_TRACT | Status: AC
  Administered 2024-01-28: 9 mL via RESPIRATORY_TRACT
  Filled 2024-01-28: qty 3

## 2024-01-28 NOTE — ED Triage Notes (Addendum)
 Pt via [facility] from home. Pt c/o SOB for the past month that continues to get worse over time, wears O2 PRN. States she put her O2 on but does not feel a difference. Pt has a hx of COPD and CHF. Also reports generalized weakness. Denies pain. Denies cough/congestion. Denies fever. Pt is A&Ox4 and NAD

## 2024-01-28 NOTE — Progress Notes (Signed)
 PHARMACY - ANTICOAGULATION CONSULT NOTE  Pharmacy Consult for warfarin Indication: atrial fibrillation and MV stenosis  Allergies  Allergen Reactions   Shellfish Allergy Anaphylaxis    Other reaction(s): Hallucination   Azithromycin  Other (See Comments)    Extreme burning sensation at IV site    Tamiflu [Oseltamivir Phosphate] Other (See Comments)    Reaction:  Hallucinations     Albuterol  Palpitations    Heart racing.    Albuterol  And Levalbuterol  Palpitations    Patient Measurements: Height: 5' 5 (165.1 cm) Weight: 51.3 kg (113 lb) IBW/kg (Calculated) : 57 HEPARIN  DW (KG): 51.3  Vital Signs: Temp: 97.7 F (36.5 C) (06/12 1842) Temp Source: Oral (06/12 1842) BP: 107/63 (06/12 1830) Pulse Rate: 83 (06/12 1830)  Labs: Recent Labs    01/27/24 0948 01/28/24 1435 01/28/24 1549  HGB  --  12.2  --   HCT  --  40.4  --   PLT  --  220  --   LABPROT  --   --  26.4*  INR 2.1  --  2.4*  CREATININE  --   --  0.63  TROPONINIHS  --  20*  --     Estimated Creatinine Clearance: 37.9 mL/min (by C-G formula based on SCr of 0.63 mg/dL).   Medical History: Past Medical History:  Diagnosis Date   Aortic atherosclerosis (HCC)    a. 05/2021 TEE: GrIII atheroma plaque involving the asc, transverse, and desc Ao.   Asthma    CAD (coronary artery disease)    a. 04/2021 Cath: LM nl, LAD 85p/m, D1 80, RI nl, LCX nl, RCA nl; b. 07/2021 PCI: pLAD (2.75x26 Onyx Frontier DES), D1 (2.5x22 Onyx Frontier DES).   Carotid artery disease (HCC)    a. s/p R carotid stenting (9mm x 7mm x 4cm long Exact stent); b. 06/2021 U/S: RICA 40-59%, LICA 40-59%.   CHF (congestive heart failure) (HCC)    Community acquired pneumonia    Essential hypertension    Glaucoma    Hyperlipidemia    Mitral regurgitation    a. TTE 08/2015: EF 60-65%, normal wall motion, mild MR, mildly dilated left atrium measuring 40 mm, RVSF normal, PASP normal; b. 05/2021 TEE: Moderate MR.   Mitral stenosis    a. 05/2021  L/RHC: Sev MS w/ mean grad 13-30mmHg and MV area of 0.5-06.cm^2; b. 05/2021 TEE: EF 55-60%, no rwma, nl RV fxn, mod MR, mod MS (MV area by P1/2t: 1.61 cm^2 w/ mean grad of ).   Peripheral neuropathy    Persistent atrial fibrillation (HCC) 09/17/2015   a. s/p DCCV 11/15/2015; b. CHADS2VASc => 4 (HTN, age x 2, female)--> warfarin; c. 06/2021 recurrent afib-->amio added.   Squamous cell carcinoma of skin 12/18/2021   R dorsum hand, EDC   Squamous cell carcinoma of skin 12/31/2021   R hand dorsum, recurrent - excised 02/04/2022   Squamous cell carcinoma of skin 12/31/2021   L forearm - ED&C   Squamous cell carcinoma of skin 06/24/2022   R thumb webspace with wart virus - ED&C   Squamous cell carcinoma of skin 10/20/2022   L lat knee - tx with ED&C    Medications:  (Not in a hospital admission)  Scheduled:   ipratropium  0.5 mg Nebulization Q6H   warfarin  3.5 mg Oral Once   [START ON 01/29/2024] Warfarin - Pharmacist Dosing Inpatient   Does not apply q1600   Infusions:   Assessment: 89 yo F to resume Warfarin for Afib and MV stenosis.  Admitted with [facility], suspected AECOPD/AECHF.  Hx: of CAD, A-fib, asthma/COPD  Per Anticoagulation clinic visit notes 01/27/24: Goal INR 2.0-3.0   Home Warfarin dose:  3.75 mg Sundays, Tuesdays, Thursdays and 2.5 mg all other days Patient had last dose of Warfarin yesterday 01/27/24 per med rec tech discussion w/ pt. Hgb 12.2  Plt 220   6/12 INR 2.4      Goal of Therapy:  INR 2-3 Monitor platelets by anticoagulation protocol: Yes   Plan:  Will order Warfarin 3.5 mg x 1 dose today Patient received Solu-medrol  125 mg x 1 6/12 and lasix  x 1 F/u INR in am for further dosing INR daily  Thomasine Flick PharmD Clinical Pharmacist 01/28/2024

## 2024-01-28 NOTE — ED Provider Notes (Addendum)
 Care of this patient assumed from prior physician at 1500 pending EKG, lab work, chest x-Kwame Ryland, reevaluation, and disposition. Please see prior physician note for further details.  Briefly this is a 89 year old female with history of CAD, A-fib, asthma/COPD presenting to the ER for shortness of breath.  On presentation here she was found to have wheezing for which she was treated with steroids and nebulizers.  Labs, x-Hien Cunliffe, EKG pending.  Lab work demonstrated leukocytosis at 18.1, but slight downtrend from recent prior and in the setting of frequent steroid use recently.  BMP without significant derangement.  Troponin minimally elevated at 20, patient denying chest pain.  BNP elevated at 537. Patient appears to have chronic elevation, similar to slightly increased compared to recent priors.  INR therapeutic.  Chest x-Donni Oglesby without focal consolidation. EKG demonstrates A-fib at a rate of 82, QRS 88, QTc 429, no acute ST changes  Patient was reassessed following completion of her workup.  She initially reported feeling improved, with interest in being discharged home.  I discussed results of her workup.  She did have improvement in her wheezing on my evaluation, but remained tachypneic.  She did get up to go to the bathroom and was noted to become diaphoretic and weak with this with worsening shortness of breath.  Suspect she may have a component of volume overload as well with her elevated BNP.  Given her ongoing symptoms, do think admission for further management of suspected COPD/CHF exacerbation is reasonable.  Ordered for a dose of IV Lasix .  Will reach out to hospitalist team.  [phone] Case reviewed with Dr. Rosalea Collin.  He will evaluate for anticipated admission.   Claria Crofts, MD 01/28/24 [phone]

## 2024-01-28 NOTE — Telephone Encounter (Signed)
 Her medical record shows she in the ER.

## 2024-01-28 NOTE — ED Provider Notes (Signed)
 [facility] Provider Note    Event Date/Time   First MD Initiated Contact with Patient 01/28/24 1318     (approximate)   History   No chief complaint on file.   HPI  Molly Hodges is a 89 y.o. female past medical history significant for CAD, asthma, carotid stenosis, hypertension, atrial fibrillation on Coumadin , presents to the emergency department with shortness of breath.  Patient had a recent hospitalization for COPD exacerbation and acute on chronic heart failure exacerbation.  Patient's most recent echocardiogram showed severe stenosis, mitral regurg and an EF of 55 to 60%.  Patient states that over the past couple of days she has had worsening increased work of breathing, cough and shortness of breath.  States that she has soft blood pressures at baseline.  Denies any nausea or vomiting or diaphoresis.  Denies any chest pain at this time.  Denies any history of DVT or PE.  States that even whenever she has heart failure exacerbation her legs are not swollen.     Physical Exam   Triage Vital Signs: ED Triage Vitals  Encounter Vitals Group     BP 01/28/24 1322 (!) 98/42     Girls Systolic BP Percentile --      Girls Diastolic BP Percentile --      Boys Systolic BP Percentile --      Boys Diastolic BP Percentile --      Pulse Rate 01/28/24 1320 86     Resp 01/28/24 1320 18     Temp 01/28/24 1320 98.1 F (36.7 C)     Temp Source 01/28/24 1320 Oral     SpO2 01/28/24 1320 100 %     Weight 01/28/24 1316 113 lb (51.3 kg)     Height 01/28/24 1316 5' 5 (1.651 m)     Head Circumference --      Peak Flow --      Pain Score 01/28/24 1316 0     Pain Loc --      Pain Education --      Exclude from Growth Chart --     Most recent vital signs: Vitals:   01/28/24 1320 01/28/24 1322  BP:  (!) 98/42  Pulse: 86   Resp: 18   Temp: 98.1 F (36.7 C)   SpO2: 100%     Physical Exam Constitutional:      Appearance: She is well-developed.  HENT:      Head: Atraumatic.     Mouth/Throat:     Mouth: Mucous membranes are moist.   Eyes:     Conjunctiva/sclera: Conjunctivae normal.    Cardiovascular:     Rate and Rhythm: Regular rhythm.  Pulmonary:     Effort: Respiratory distress present.     Breath sounds: Wheezing and rhonchi present.  Abdominal:     General: There is no distension.   Musculoskeletal:        General: Normal range of motion.     Cervical back: Normal range of motion.     Right lower leg: No edema.     Left lower leg: No edema.     Comments: No unilateral leg swelling   Skin:    General: Skin is warm.     Capillary Refill: Capillary refill takes less than 2 seconds.   Neurological:     Mental Status: She is alert. Mental status is at baseline.   Psychiatric:        Mood and Affect: Mood normal.  IMPRESSION / MDM / ASSESSMENT AND PLAN / ED COURSE  I reviewed the triage vital signs and the nursing notes.  On chart review patient had a recent hospitalization and was discharged on 01/17/2024.  Patient COPD exacerbation and required nebulizer and Solu-Medrol  with a prednisone  taper.  Patient received low-dose IV Lasix   Differential diagnosis including COPD exacerbation, CHF, anemia, ACS, pneumonia, pneumothorax.    RADIOLOGY I independently reviewed imaging, my interpretation of imaging: Chest x-ray with no signs of pneumonia.  No widened mediastinum and no pulmonary edema.  LABS (all labs ordered are listed, but only abnormal results are displayed) Labs interpreted as -    Labs Reviewed  CBC  BASIC METABOLIC PANEL WITH GFR  BRAIN NATRIURETIC PEPTIDE  PROTIME-INR  TROPONIN I (HIGH SENSITIVITY)     MDM    Patient was given DuoNeb treatment and IV Solu-Medrol  given significant wheezing on exam.  Lab work obtained.  Chest x-ray with no signs of pneumonia or pneumothorax.  No altered mental status.     PROCEDURES:  Critical Care performed: No  Procedures  Patient's presentation is  most consistent with acute presentation with potential threat to life or bodily function.   MEDICATIONS ORDERED IN ED: Medications  ipratropium-albuterol  (DUONEB) 0.5-2.5 (3) MG/3ML nebulizer solution 9 mL (9 mLs Nebulization Given 01/28/24 1414)  methylPREDNISolone  sodium succinate (SOLU-MEDROL ) 125 mg/2 mL injection 125 mg (125 mg Intravenous Given 01/28/24 1413)    FINAL CLINICAL IMPRESSION(S) / ED DIAGNOSES   Final diagnoses:  Dyspnea, unspecified type  COPD exacerbation (HCC)     Rx / DC Orders   ED Discharge Orders     None        Note:  This document was prepared using Dragon voice recognition software and may include unintentional dictation errors.   Viviano Ground, MD 01/28/24 [phone]

## 2024-01-28 NOTE — Telephone Encounter (Signed)
 I do not see any documentation prior to this one of pt requesting prednisone . Please advise on refill, as it is not on her medication list.

## 2024-01-28 NOTE — Telephone Encounter (Signed)
 Agree that she should be evaluated.  Will loop in PCP in case he knows something different about this patient

## 2024-01-28 NOTE — H&P (Signed)
 History and Physical    Molly Hodges ZOX:[id number] DOB: [birth year] DOA: 01/28/2024  Referring MD/NP/PA:   PCP: Lamon Pillow, MD   Patient coming from:  The patient is coming from home.     Chief Complaint: SOB  HPI: Molly Hodges is a 89 y.o. female with medical history significant of COPD, asthma, hypertension, hyperlipidemia, hypothyroidism, atrial fibrillation on Coumadin , mitral valve prolapse, carotid artery stenosis, anemia, PVD, CAD, dCHF, who presents with SOB.  Patient was recently hospitalized from 5/29 - 6/1 due to COPD and CHF exacerbation.  Patient states that her shortness of breath has been progressively worsening the past several days.  Associated with wheezing, no cough and chest pain.  No fever or chills.  No nausea, vomiting, diarrhea or abdominal pain.  No symptoms of UTI.  Data reviewed independently and ED Course: pt was found to have WBC 18.1, GFR> 60, troponin 20, temperature normal, blood pressure 98/42, heart rate 86, RR 100, oxygen  saturation 97% on room air.  Chest x-ray negative.  Patient is placed in telemetry bed for observation.   EKG: I have personally reviewed.  Atrial fibrillation, QTc 429.   Review of Systems:   General: no fevers, chills, no body weight gain, has fatigue HEENT: no blurry vision, hearing changes or sore throat Respiratory: has dyspnea and wheezing CV: no chest pain, no palpitations GI: no nausea, vomiting, abdominal pain, diarrhea, constipation GU: no dysuria, burning on urination, increased urinary frequency, hematuria  Ext: no leg edema Neuro: no unilateral weakness, numbness, or tingling, no vision change or hearing loss Skin: no rash, no skin tear. MSK: No muscle spasm, no deformity, no limitation of range of movement in spin Heme: No easy bruising.  Travel history: No recent long distant travel.   Allergy:  Allergies  Allergen Reactions   Shellfish Allergy Anaphylaxis    Other reaction(s):  Hallucination   Azithromycin  Other (See Comments)    Extreme burning sensation at IV site    Tamiflu [Oseltamivir Phosphate] Other (See Comments)    Reaction:  Hallucinations     Albuterol  Palpitations    Heart racing.    Albuterol  And Levalbuterol  Palpitations    Past Medical History:  Diagnosis Date   Aortic atherosclerosis (HCC)    a. 05/2021 TEE: GrIII atheroma plaque involving the asc, transverse, and desc Ao.   Asthma    CAD (coronary artery disease)    a. 04/2021 Cath: LM nl, LAD 85p/m, D1 80, RI nl, LCX nl, RCA nl; b. 07/2021 PCI: pLAD (2.75x26 Onyx Frontier DES), D1 (2.5x22 Onyx Frontier DES).   Carotid artery disease (HCC)    a. s/p R carotid stenting (9mm x 7mm x 4cm long Exact stent); b. 06/2021 U/S: RICA 40-59%, LICA 40-59%.   CHF (congestive heart failure) (HCC)    Community acquired pneumonia    Essential hypertension    Glaucoma    Hyperlipidemia    Mitral regurgitation    a. TTE 08/2015: EF 60-65%, normal wall motion, mild MR, mildly dilated left atrium measuring 40 mm, RVSF normal, PASP normal; b. 05/2021 TEE: Moderate MR.   Mitral stenosis    a. 05/2021 L/RHC: Sev MS w/ mean grad 13-25mmHg and MV area of 0.5-06.cm^2; b. 05/2021 TEE: EF 55-60%, no rwma, nl RV fxn, mod MR, mod MS (MV area by P1/2t: 1.61 cm^2 w/ mean grad of ).   Peripheral neuropathy    Persistent atrial fibrillation (HCC) 09/17/2015   a. s/p DCCV 11/15/2015; b. CHADS2VASc => 4 (HTN,  age x 2, female)--> warfarin; c. 06/2021 recurrent afib-->amio added.   Squamous cell carcinoma of skin 12/18/2021   R dorsum hand, EDC   Squamous cell carcinoma of skin 12/31/2021   R hand dorsum, recurrent - excised 02/04/2022   Squamous cell carcinoma of skin 12/31/2021   L forearm - ED&C   Squamous cell carcinoma of skin 06/24/2022   R thumb webspace with wart virus - ED&C   Squamous cell carcinoma of skin 10/20/2022   L lat knee - tx with ED&C    Past Surgical History:  Procedure Laterality Date    ABDOMINAL HYSTERECTOMY  1987   due to heavy bleeding   APPENDECTOMY     CARDIAC CATHETERIZATION     CAROTID PTA/STENT INTERVENTION Right 08/15/2020   Procedure: CAROTID PTA/STENT INTERVENTION;  Surgeon: Celso College, MD;  Location: [facility];  Service: Cardiovascular;  Laterality: Right;   CATARACT EXTRACTION     CORONARY IMAGING/OCT N/A 08/02/2021   Procedure: INTRAVASCULAR IMAGING/OCT;  Surgeon: Sammy Crisp, MD;  Location: [facility];  Service: Cardiovascular;  Laterality: N/A;   CORONARY STENT INTERVENTION N/A 08/02/2021   Procedure: CORONARY STENT INTERVENTION;  Surgeon: Sammy Crisp, MD;  Location: [facility];  Service: Cardiovascular;  Laterality: N/A;   ELECTROPHYSIOLOGIC STUDY N/A 11/15/2015   Procedure: CARDIOVERSION;  Surgeon: Devorah Fonder, MD;  Location: [facility];  Service: Cardiovascular;  Laterality: N/A;   LEFT HEART CATH AND CORONARY ANGIOGRAPHY N/A 08/02/2021   Procedure: LEFT HEART CATH AND CORONARY ANGIOGRAPHY;  Surgeon: Sammy Crisp, MD;  Location: [facility];  Service: Cardiovascular;  Laterality: N/A;   LEFT HEART CATH AND CORONARY ANGIOGRAPHY N/A 08/07/2021   Procedure: LEFT HEART CATH AND CORONARY ANGIOGRAPHY;  Surgeon: Wenona Hamilton, MD;  Location: [facility];  Service: Cardiovascular;  Laterality: N/A;   RIGHT/LEFT HEART CATH AND CORONARY ANGIOGRAPHY N/A 05/17/2021   Procedure: RIGHT/LEFT HEART CATH AND CORONARY ANGIOGRAPHY;  Surgeon: Sammy Crisp, MD;  Location: [facility];  Service: Cardiovascular;  Laterality: N/A;   TEE WITHOUT CARDIOVERSION N/A 06/13/2021   Procedure: TRANSESOPHAGEAL ECHOCARDIOGRAM (TEE);  Surgeon: Gollan, Timothy J, MD;  Location: [facility];  Service: Cardiovascular;  Laterality: N/A;    Social History:  reports that she has never smoked. She has never used smokeless tobacco. She reports that she does not drink alcohol and does not use drugs.  Family History:   Family History  Problem Relation Age of Onset   CAD Mother    CAD Father    Cancer Son 57       lung cancer     Prior to Admission medications   Medication Sig Start Date End Date Taking? Authorizing Provider  arformoterol  (BROVANA ) 15 MCG/2ML NEBU Take 2 mLs (15 mcg total) by nebulization 2 (two) times daily. 09/28/23   Kasa, Kurian, MD  budesonide  (PULMICORT ) 0.5 MG/2ML nebulizer solution Take 2 mLs (0.5 mg total) by nebulization 2 (two) times daily. 09/28/23 09/27/24  Kasa, Kurian, MD  cholecalciferol  (VITAMIN D ) 1000 UNITS tablet Take 1,000 Units by mouth daily.    [provider]  diltiazem  (CARDIZEM  CD) 120 MG 24 hr capsule TAKE 1 CAPSULE BY MOUTH EVERY DAY 01/04/24   Gollan, Timothy J, MD  dorzolamide  (TRUSOPT ) 2 % ophthalmic solution Place 1 drop into both eyes 2 (two) times daily.     [provider]  ezetimibe  (ZETIA ) 10 MG tablet TAKE 1 TABLET BY MOUTH DAILY 06/22/23   Gollan,  Deadra Everts, MD  ferrous sulfate  325 (65 FE) MG EC tablet Take 1 tablet (325 mg total) by mouth daily with breakfast. 01/17/24 01/16/25  Dezii, Alexandra, DO  ipratropium-albuterol  (DUONEB) 0.5-2.5 (3) MG/3ML SOLN TAKE BY NEBULIZATION EVERY 4 HOURS AS NEEDED 04/14/23   Lamon Pillow, MD  latanoprost  (XALATAN ) 0.005 % ophthalmic solution Place 1 drop into both eyes at bedtime.     [provider]  levalbuterol  (XOPENEX  HFA) 45 MCG/ACT inhaler Inhale 2 puffs into the lungs every 6 (six) hours as needed for wheezing. 01/05/24   Lamon Pillow, MD  nitroGLYCERIN  (NITROSTAT ) 0.4 MG SL tablet Place 1 tablet (0.4 mg total) under the tongue every 5 (five) minutes as needed (for chest pain or shortness of breath). 08/07/23 08/06/24  Morey Ar, NP  potassium chloride  (KLOR-CON ) 10 MEQ tablet TAKE 2 TABLETS BY MOUTH DAILY. TAKE EXTRA 2 TABLETS WHEN TAKING EXTRA LASIX . 07/20/23   Gollan, Timothy J, MD  pregabalin  (LYRICA ) 50 MG capsule TAKE 1 CAPSULE BY MOUTH 2 TIMES DAILY 08/04/23    Lamon Pillow, MD  rosuvastatin  (CRESTOR ) 20 MG tablet TAKE 1 TABLET BY MOUTH DAILY 09/04/23   Gollan, Timothy J, MD  torsemide  (DEMADEX ) 20 MG tablet Take 1 tablet (20 mg total) by mouth daily. You may take additional dose (20 mg tab) for 3 pound gain overnight or 5 pound gain in 1 week. 08/07/23   Morey Ar, NP  vitamin C  (ASCORBIC ACID ) 500 MG tablet Take 500 mg by mouth daily.    [provider]  warfarin (COUMADIN ) 2.5 MG tablet TAKE 1 & 1/2 TABLETS DAILY EXCEPT TAKE ONE TABLET ON MONDAYS, WEDNESDAYS, AND FRIDAYS OR AS DIRECTED BY COUMADIN  CLINIC 10/26/23   Devorah Fonder, MD    Physical Exam: Vitals:   01/28/24 1830 01/28/24 1842 01/28/24 2016 01/28/24 2038  BP: 107/63   (!) 111/45  Pulse: 83   75  Resp: (!) 21   18  Temp:  97.7 F (36.5 C)  (!) 97.5 F (36.4 C)  TempSrc:  Oral  Oral  SpO2: 99%  98% 99%  Weight:      Height:       General: Not in acute distress HEENT:       Eyes: PERRL, EOMI, no jaundice       ENT: No discharge from the ears and nose, no pharynx injection, no tonsillar enlargement.        Neck: No JVD, no bruit, no mass felt. Heme: No neck lymph node enlargement. Cardiac: S1/S2, RRR, No murmurs, No gallops or rubs. Respiratory: has mildly decreased air movement bilaterally, has mild wheezing on the right side GI: Soft, nondistended, nontender, no rebound pain, no organomegaly, BS present. GU: No hematuria Ext: No pitting leg edema bilaterally. 1+DP/PT pulse bilaterally. Musculoskeletal: No joint deformities, No joint redness or warmth, no limitation of ROM in spin. Skin: No rashes.  Neuro: Alert, oriented X3, cranial nerves II-XII grossly intact, moves all extremities normally.  Psych: Patient is not psychotic, no suicidal or hemocidal ideation.  Labs on Admission: I have personally reviewed following labs and imaging studies  CBC: Recent Labs  Lab 01/28/24 1435  WBC 18.1*  HGB 12.2  HCT 40.4  MCV 74.1*  PLT 220   Basic  Metabolic Panel: Recent Labs  Lab 01/28/24 1549  NA 138  K 3.6  CL 100  CO2 27  GLUCOSE 173*  BUN 16  CREATININE 0.63  CALCIUM  7.8*   GFR: Estimated Creatinine  Clearance: 37.9 mL/min (by C-G formula based on SCr of 0.63 mg/dL). Liver Function Tests: No results for input(s): AST, ALT, ALKPHOS, BILITOT, PROT, ALBUMIN in the last 168 hours. No results for input(s): LIPASE, AMYLASE in the last 168 hours. No results for input(s): AMMONIA in the last 168 hours. Coagulation Profile: Recent Labs  Lab 01/27/24 0948 01/28/24 1549  INR 2.1 2.4*   Cardiac Enzymes: No results for input(s): CKTOTAL, CKMB, CKMBINDEX, TROPONINI in the last 168 hours. BNP (last 3 results) No results for input(s): PROBNP in the last 8760 hours. HbA1C: No results for input(s): HGBA1C in the last 72 hours. CBG: No results for input(s): GLUCAP in the last 168 hours. Lipid Profile: No results for input(s): CHOL, HDL, LDLCALC, TRIG, CHOLHDL, LDLDIRECT in the last 72 hours. Thyroid  Function Tests: No results for input(s): TSH, T4TOTAL, FREET4, T3FREE, THYROIDAB in the last 72 hours. Anemia Panel: No results for input(s): VITAMINB12, FOLATE, FERRITIN, TIBC, IRON, RETICCTPCT in the last 72 hours. Urine analysis:    Component Value Date/Time   COLORURINE AMBER (A) 09/17/2015 1841   APPEARANCEUR HAZY (A) 09/17/2015 1841   LABSPEC 1.029 09/17/2015 1841   PHURINE 5.0 09/17/2015 1841   GLUCOSEU NEGATIVE 09/17/2015 1841   HGBUR 2+ (A) 09/17/2015 1841   BILIRUBINUR NEGATIVE 09/17/2015 1841   KETONESUR NEGATIVE 09/17/2015 1841   PROTEINUR 100 (A) 09/17/2015 1841   NITRITE NEGATIVE 09/17/2015 1841   LEUKOCYTESUR 1+ (A) 09/17/2015 1841   Sepsis Labs: @LABRCNTIP (procalcitonin:4,lacticidven:4) ) Recent Results (from the past 240 hours)  Resp panel by RT-PCR (RSV, Flu A&B, Covid) Anterior Nasal Swab     Status: None   Collection Time: 01/28/24   8:30 PM   Specimen: Anterior Nasal Swab  Result Value Ref Range Status   SARS Coronavirus 2 by RT PCR NEGATIVE NEGATIVE Final    Comment: (NOTE) SARS-CoV-2 target nucleic acids are NOT DETECTED.  The SARS-CoV-2 RNA is generally detectable in upper respiratory specimens during the acute phase of infection. The lowest concentration of SARS-CoV-2 viral copies this assay can detect is 138 copies/mL. A negative result does not preclude SARS-Cov-2 infection and should not be used as the sole basis for treatment or other patient management decisions. A negative result may occur with  improper specimen collection/handling, submission of specimen other than nasopharyngeal swab, presence of viral mutation(s) within the areas targeted by this assay, and inadequate number of viral copies(<138 copies/mL). A negative result must be combined with clinical observations, patient history, and epidemiological information. The expected result is Negative.  Fact Sheet for Patients:  BloggerCourse.com  Fact Sheet for Healthcare Providers:  SeriousBroker.it  This test is no t yet approved or cleared by the [place]  FDA and  has been authorized for detection and/or diagnosis of SARS-CoV-2 by FDA under an Emergency Use Authorization (EUA). This EUA will remain  in effect (meaning this test can be used) for the duration of the COVID-19 declaration under Section 564(b)(1) of the Act, 21 U.S.C.section 360bbb-3(b)(1), unless the authorization is terminated  or revoked sooner.       Influenza A by PCR NEGATIVE NEGATIVE Final   Influenza B by PCR NEGATIVE NEGATIVE Final    Comment: (NOTE) The Xpert Xpress SARS-CoV-2/FLU/RSV plus assay is intended as an aid in the diagnosis of influenza from Nasopharyngeal swab specimens and should not be used as a sole basis for treatment. Nasal washings and aspirates are unacceptable for Xpert Xpress  SARS-CoV-2/FLU/RSV testing.  Fact Sheet for Patients: BloggerCourse.com  Fact Sheet for Healthcare  Providers: SeriousBroker.it  This test is not yet approved or cleared by the [place]  FDA and has been authorized for detection and/or diagnosis of SARS-CoV-2 by FDA under an Emergency Use Authorization (EUA). This EUA will remain in effect (meaning this test can be used) for the duration of the COVID-19 declaration under Section 564(b)(1) of the Act, 21 U.S.C. section 360bbb-3(b)(1), unless the authorization is terminated or revoked.     Resp Syncytial Virus by PCR NEGATIVE NEGATIVE Final    Comment: (NOTE) Fact Sheet for Patients: BloggerCourse.com  Fact Sheet for Healthcare Providers: SeriousBroker.it  This test is not yet approved or cleared by the [place]  FDA and has been authorized for detection and/or diagnosis of SARS-CoV-2 by FDA under an Emergency Use Authorization (EUA). This EUA will remain in effect (meaning this test can be used) for the duration of the COVID-19 declaration under Section 564(b)(1) of the Act, 21 U.S.C. section 360bbb-3(b)(1), unless the authorization is terminated or revoked.  Performed at [facility], [address], [place], [place] [address]      Radiological Exams on Admission:   Assessment/Plan Principal Problem:   COPD exacerbation (HCC) Active Problems:   Asthma exacerbation   Chronic diastolic CHF (congestive heart failure) (HCC)   Coronary artery disease   Myocardial injury   Hyperlipidemia   Essential hypertension   Atrial fibrillation, chronic (HCC)   Leukocytosis   Iron deficiency anemia   Assessment and Plan:   COPD and asthma exacerbation (HCC): Her shortness of breath is likely due to COPD/asthma exacerbation.  She has mildly decreased air movement bilaterally,  and mild wheezing on the right  side.  No new oxygen  requirement.  -Place in tele bed for obs -Bronchodilators and prn Mucinex  -Solu-Medrol  80 mg IV daily after given 125 mg of Solu-Medrol  -Incentive spirometry -check RESP panel -Follow up sputum culture  Asthma exacerbation: -see above  Chronic diastolic CHF (congestive heart failure) (HCC): 2D echo on 09/03/2023 showed EF of 55 to 60%.  Patient has elevated BNP 537, but no leg edema or JVD.  Does not seem to have acute CHF exacerbation, but likely at risk of developing CHF exacerbation. - Patient received 40 mg of IV Lasix  in ED - Continue home torsemide  20 mg daily  Coronary artery disease and Myocardial injury: Troponin 20, no chest pain, likely demand ischemia. -Continue Zetia , Crestor   Hyperlipidemia -Zetia , Crestor   Essential hypertension -IV hydralazine  as needed - Cardizem   Atrial fibrillation, chronic (HCC): Heart rate 80s -Continue Coumadin  per pharm dosing - Cardizem   Leukocytosis: WBC 18.1 (20 on 12/29/2023), no fever, no source of infection identified.  Likely due to recent steroid use. -Follow-up with CBC  Iron deficiency anemia: Hemoglobin stable at 12.2. - Continue iron supplement    DVT ppx: On Coumadin   Code Status: DNR   Family Communication:     not done, no family member is at bed side.  Disposition Plan:  Anticipate discharge back to previous environment  Consults called:  none  Admission status and Level of care: Telemetry Cardiac:    for obs   Dispo: The patient is from: Home              Anticipated d/c is to: Home              Anticipated d/c date is: 1 day              Patient currently is not medically stable to d/c.    Severity of Illness:  The appropriate patient status for this patient is OBSERVATION. Observation status is judged to be reasonable and necessary in order to provide the required intensity of service to ensure the patient's safety. The patient's presenting symptoms, physical exam findings, and  initial radiographic and laboratory data in the context of their medical condition is felt to place them at decreased risk for further clinical deterioration. Furthermore, it is anticipated that the patient will be medically stable for discharge from the hospital within 2 midnights of admission.        Date of Service 01/28/2024    Fidencio Hue [facility]   If 7PM-7AM, please contact night-coverage [email or web address] 01/28/2024, 10:21 PM

## 2024-01-28 NOTE — Telephone Encounter (Signed)
 Copied from CRM [id number]. Topic: General - Other >> Jan 28, 2024 11:36 AM Tiffini S wrote: Reason for CRM: [name] (daughter) called for a update for requesting PCP send in prednisone  for patient instead. States that the patient was on the medication before. Asked for a call back soon from office at [phone].

## 2024-01-28 NOTE — Telephone Encounter (Signed)
 Noted

## 2024-01-28 NOTE — Telephone Encounter (Signed)
 FYI Only or Action Required?: Action required by provider  Patient was last seen in primary care on 01/05/2024 by Lamon Pillow, MD. Called Nurse Triage reporting Shortness of Breath, Fatigue, and Congestive Heart Failure. Symptoms began to worsen several days ago. Interventions attempted: Prescription medications: inhalers and nebulizers (duoneb, Pulmicort , Xopenex ), prednisone  (completed). Symptoms are: moderate to severe SOB, generalized weakness gradually worsening.  Triage Disposition: Go to ED or PCP/Alternative with Approval- Refused, daughter ([name]) requesting call back from clinic  Patient/caregiver understands and will follow disposition?: No, wishes to speak with PCP                 Copied from CRM [id number]. Topic: Clinical - Red Word Triage >> Jan 28, 2024  9:17 AM Zipporah Him wrote: Red Word that prompted transfer to Nurse Triage: Patient has been in and out of the ER for heart failure. She is not doing well, declining quickly, currently weak and feeling very terrible, trouble breathing. She is on and off prednisone  from the hospital where she has been recently a couple times for this issue. ER stated this may be her new baseline. Daughter is trying to figure out next steps with her mother, she doesn't feel like they got much instruction from the hospital. Reason for Disposition  MODERATE difficulty breathing (e.g., speaks in phrases, SOB even at rest, pulse 100-120)  Answer Assessment - Initial Assessment Questions 1. MAIN CONCERN OR SYMPTOM:  What is your main concern right now? What questions do you have? What's the main symptom you're worried about? (e.g., breathing difficulty, ankle swelling, weight gain.)     SOB, overall weakness/fatigue.  2. ONSET: When did the  symptoms  start?     Steadily increasing over the past 3-4 days.  3. BREATHING DIFFICULTY: Are you having any difficulty breathing? If Yes, ask: How bad is it?  (e.g., none, mild, moderate,  severe)   - MILD: No SOB at rest, mild SOB with walking, speaks normally in sentences, able to lie down, no retractions, pulse < 100.   - MODERATE: SOB at rest, SOB with minimal exertion and prefers to sit, cannot lie down flat, speaks in phrases, mild retractions, audible wheezing, pulse 100-120.   - SEVERE: Very SOB at rest, speaks in single words, struggling to breathe, sitting hunched forward, retractions, pulse > 120      Moderate to severe.  4. BETTER-SAME-WORSE: Are you getting better, staying the same, or getting worse compared to the day you were discharged?     Getting worse. Daughter states as soon as she gets off the prednisone  within a few days she bottoms out.  5. HOSPITALIZATION: How long were you hospitalized? (e.g., days)     3 days.  6. DISCHARGE DATE: What date were you discharged from the hospital?     June 1st.  7. DISCHARGE DOCTOR: Who is the main doctor taking care of you now?     Dr Shann Darnel.  8. DISCHARGE APPOINTMENT: Have you scheduled a follow-up discharge appointment with your doctor?     No.  9. DISCHARGE MEDICINES: Did the physician who discharged you order any new medicines for you to use? If Yes, ask: Have you filled the prescription and started taking the medicine?      Prednisone , completed the dose.  10. WEIGHT - DISCHARGE:  Do you know your weight when you were discharged from the hospital?        Unsure.  11. WEIGHT - TARGET:  Do you have a  target weight?        Unsure.  12. WEIGHT - CURRENT:  What is your current weight?        Unsure. Daughter states she has been losing weight.  13. OTHER SYMPTOMS: Do you have any other symptoms? (e.g., depression, weakness)       Weakness.  14. O2 SATURATION MONITOR:  Do you use an oxygen  saturation monitor (pulse oximeter) at home? If Yes, What is your reading (oxygen  level) today? What is your usual oxygen  saturation reading? (e.g., 95%)       2 days ago it was 94% at rest, she  is unsure what it was today.  Daughter refused ED disposition, notified [facility].  Protocols used: Heart Failure Post-Hospitalization Follow-up Call-A-[facility]

## 2024-01-29 DIAGNOSIS — R0600 Dyspnea, unspecified: Secondary | ICD-10-CM | POA: Diagnosis not present

## 2024-01-29 DIAGNOSIS — E872 Acidosis, unspecified: Secondary | ICD-10-CM | POA: Diagnosis not present

## 2024-01-29 DIAGNOSIS — I2510 Atherosclerotic heart disease of native coronary artery without angina pectoris: Secondary | ICD-10-CM | POA: Diagnosis not present

## 2024-01-29 DIAGNOSIS — Z1152 Encounter for screening for COVID-19: Secondary | ICD-10-CM | POA: Diagnosis not present

## 2024-01-29 DIAGNOSIS — I4819 Other persistent atrial fibrillation: Secondary | ICD-10-CM | POA: Diagnosis not present

## 2024-01-29 DIAGNOSIS — I5A Non-ischemic myocardial injury (non-traumatic): Secondary | ICD-10-CM | POA: Diagnosis not present

## 2024-01-29 DIAGNOSIS — I482 Chronic atrial fibrillation, unspecified: Secondary | ICD-10-CM | POA: Diagnosis not present

## 2024-01-29 DIAGNOSIS — E785 Hyperlipidemia, unspecified: Secondary | ICD-10-CM | POA: Diagnosis not present

## 2024-01-29 DIAGNOSIS — R0602 Shortness of breath: Secondary | ICD-10-CM | POA: Diagnosis not present

## 2024-01-29 DIAGNOSIS — I050 Rheumatic mitral stenosis: Secondary | ICD-10-CM | POA: Diagnosis not present

## 2024-01-29 DIAGNOSIS — I272 Pulmonary hypertension, unspecified: Secondary | ICD-10-CM | POA: Diagnosis not present

## 2024-01-29 DIAGNOSIS — N179 Acute kidney failure, unspecified: Secondary | ICD-10-CM | POA: Diagnosis not present

## 2024-01-29 DIAGNOSIS — Z66 Do not resuscitate: Secondary | ICD-10-CM | POA: Diagnosis not present

## 2024-01-29 DIAGNOSIS — J449 Chronic obstructive pulmonary disease, unspecified: Secondary | ICD-10-CM | POA: Diagnosis not present

## 2024-01-29 DIAGNOSIS — I110 Hypertensive heart disease with heart failure: Secondary | ICD-10-CM | POA: Diagnosis not present

## 2024-01-29 DIAGNOSIS — I2489 Other forms of acute ischemic heart disease: Secondary | ICD-10-CM | POA: Diagnosis not present

## 2024-01-29 DIAGNOSIS — I700 Atherosclerosis of aorta: Secondary | ICD-10-CM | POA: Diagnosis not present

## 2024-01-29 DIAGNOSIS — E871 Hypo-osmolality and hyponatremia: Secondary | ICD-10-CM | POA: Diagnosis not present

## 2024-01-29 DIAGNOSIS — Z7951 Long term (current) use of inhaled steroids: Secondary | ICD-10-CM | POA: Diagnosis not present

## 2024-01-29 DIAGNOSIS — T502X5A Adverse effect of carbonic-anhydrase inhibitors, benzothiadiazides and other diuretics, initial encounter: Secondary | ICD-10-CM | POA: Diagnosis not present

## 2024-01-29 DIAGNOSIS — Z79899 Other long term (current) drug therapy: Secondary | ICD-10-CM | POA: Diagnosis not present

## 2024-01-29 DIAGNOSIS — I739 Peripheral vascular disease, unspecified: Secondary | ICD-10-CM | POA: Diagnosis not present

## 2024-01-29 DIAGNOSIS — E1165 Type 2 diabetes mellitus with hyperglycemia: Secondary | ICD-10-CM | POA: Diagnosis not present

## 2024-01-29 DIAGNOSIS — I351 Nonrheumatic aortic (valve) insufficiency: Secondary | ICD-10-CM | POA: Diagnosis not present

## 2024-01-29 DIAGNOSIS — Z8249 Family history of ischemic heart disease and other diseases of the circulatory system: Secondary | ICD-10-CM | POA: Diagnosis not present

## 2024-01-29 DIAGNOSIS — I5032 Chronic diastolic (congestive) heart failure: Secondary | ICD-10-CM | POA: Diagnosis not present

## 2024-01-29 DIAGNOSIS — E039 Hypothyroidism, unspecified: Secondary | ICD-10-CM | POA: Diagnosis not present

## 2024-01-29 DIAGNOSIS — D509 Iron deficiency anemia, unspecified: Secondary | ICD-10-CM | POA: Diagnosis not present

## 2024-01-29 DIAGNOSIS — Z7901 Long term (current) use of anticoagulants: Secondary | ICD-10-CM | POA: Diagnosis not present

## 2024-01-29 DIAGNOSIS — J441 Chronic obstructive pulmonary disease with (acute) exacerbation: Secondary | ICD-10-CM

## 2024-01-29 LAB — BLOOD GAS, VENOUS
Acid-base deficit: 0.2 mmol/L (ref 0.0–2.0)
Bicarbonate: 24 mmol/L (ref 20.0–28.0)
O2 Saturation: 91.7 %
Patient temperature: 37
pCO2, Ven: 37 mmHg — ABNORMAL LOW (ref 44–60)
pH, Ven: 7.42 (ref 7.25–7.43)
pO2, Ven: 57 mmHg — ABNORMAL HIGH (ref 32–45)

## 2024-01-29 LAB — COMPREHENSIVE METABOLIC PANEL WITH GFR
ALT: 23 U/L (ref 0–44)
AST: 27 U/L (ref 15–41)
Albumin: 2.5 g/dL — ABNORMAL LOW (ref 3.5–5.0)
Alkaline Phosphatase: 55 U/L (ref 38–126)
Anion gap: 15 (ref 5–15)
BUN: 27 mg/dL — ABNORMAL HIGH (ref 8–23)
CO2: 20 mmol/L — ABNORMAL LOW (ref 22–32)
Calcium: 7.7 mg/dL — ABNORMAL LOW (ref 8.9–10.3)
Chloride: 95 mmol/L — ABNORMAL LOW (ref 98–111)
Creatinine, Ser: 1.26 mg/dL — ABNORMAL HIGH (ref 0.44–1.00)
GFR, Estimated: 41 mL/min — ABNORMAL LOW (ref >60)
Glucose, Bld: 561 mg/dL (ref 70–99)
Potassium: 3.8 mmol/L (ref 3.5–5.1)
Sodium: 130 mmol/L — ABNORMAL LOW (ref 135–145)
Total Bilirubin: 1.1 mg/dL (ref 0.0–1.2)
Total Protein: 5.5 g/dL — ABNORMAL LOW (ref 6.5–8.1)

## 2024-01-29 LAB — PROTIME-INR
INR: 3.1 — ABNORMAL HIGH (ref 0.8–1.2)
Prothrombin Time: 32 s — ABNORMAL HIGH (ref 11.4–15.2)

## 2024-01-29 LAB — GLUCOSE, CAPILLARY
Glucose-Capillary: 220 mg/dL — ABNORMAL HIGH (ref 70–99)
Glucose-Capillary: 231 mg/dL — ABNORMAL HIGH (ref 70–99)
Glucose-Capillary: 233 mg/dL — ABNORMAL HIGH (ref 70–99)
Glucose-Capillary: 272 mg/dL — ABNORMAL HIGH (ref 70–99)
Glucose-Capillary: 305 mg/dL — ABNORMAL HIGH (ref 70–99)
Glucose-Capillary: 433 mg/dL — ABNORMAL HIGH (ref 70–99)
Glucose-Capillary: 451 mg/dL — ABNORMAL HIGH (ref 70–99)
Glucose-Capillary: 500 mg/dL — ABNORMAL HIGH (ref 70–99)
Glucose-Capillary: 567 mg/dL (ref 70–99)

## 2024-01-29 LAB — MAGNESIUM: Magnesium: 2 mg/dL (ref 1.7–2.4)

## 2024-01-29 LAB — GLUCOSE, RANDOM: Glucose, Bld: 562 mg/dL (ref 70–99)

## 2024-01-29 LAB — BETA-HYDROXYBUTYRIC ACID: Beta-Hydroxybutyric Acid: 0.29 mmol/L — ABNORMAL HIGH (ref 0.05–0.27)

## 2024-01-29 MED ORDER — INSULIN ASPART 100 UNIT/ML IJ SOLN
20.0000 [IU] | Freq: Once | INTRAMUSCULAR | Status: AC
  Administered 2024-01-29: 20 [IU] via SUBCUTANEOUS

## 2024-01-29 MED ORDER — INSULIN ASPART 100 UNIT/ML IJ SOLN
25.0000 [IU] | Freq: Once | INTRAMUSCULAR | Status: AC
  Administered 2024-01-29: 25 [IU] via SUBCUTANEOUS
  Filled 2024-01-29: qty 1

## 2024-01-29 MED ORDER — INSULIN ASPART 100 UNIT/ML IJ SOLN
0.0000 [IU] | INTRAMUSCULAR | Status: DC
  Administered 2024-01-29: 11 [IU] via SUBCUTANEOUS
  Administered 2024-01-29 (×2): 7 [IU] via SUBCUTANEOUS
  Administered 2024-01-29: 15 [IU] via SUBCUTANEOUS
  Administered 2024-01-29: 7 [IU] via SUBCUTANEOUS
  Administered 2024-01-29: 20 [IU] via SUBCUTANEOUS
  Administered 2024-01-30: 7 [IU] via SUBCUTANEOUS
  Administered 2024-01-30: 3 [IU] via SUBCUTANEOUS
  Filled 2024-01-29 (×9): qty 1

## 2024-01-29 MED ORDER — METHYLPREDNISOLONE SODIUM SUCC 40 MG IJ SOLR
40.0000 mg | INTRAMUSCULAR | Status: DC
  Administered 2024-01-29: 40 mg via INTRAVENOUS
  Filled 2024-01-29: qty 1

## 2024-01-29 MED ORDER — METOPROLOL TARTRATE 5 MG/5ML IV SOLN: 5.0000 mg | Freq: Once | INTRAVENOUS | Status: DC

## 2024-01-29 MED ORDER — IPRATROPIUM-ALBUTEROL 0.5-2.5 (3) MG/3ML IN SOLN
3.0000 mL | Freq: Four times a day (QID) | RESPIRATORY_TRACT | Status: DC
  Administered 2024-01-29 – 2024-01-30 (×3): 3 mL via RESPIRATORY_TRACT
  Filled 2024-01-29 (×4): qty 3

## 2024-01-29 MED ORDER — INSULIN ASPART 100 UNIT/ML IJ SOLN
20.0000 [IU] | Freq: Once | INTRAMUSCULAR | Status: DC
  Filled 2024-01-29: qty 1

## 2024-01-29 MED ORDER — LACTATED RINGERS IV BOLUS
1000.0000 mL | Freq: Once | INTRAVENOUS | Status: AC
  Administered 2024-01-29: 1000 mL via INTRAVENOUS

## 2024-01-29 NOTE — Plan of Care (Signed)

## 2024-01-29 NOTE — Progress Notes (Signed)
 Heart Failure Navigator Progress Note  Patient currently sees Shawnee Dellen @ [facility]. Next appointment not until  03/14/24.  A new hospital follow-up scheduled for 02/12/24 @ 1:30 PM.   Detailed education and instructions reviewed on heart failure disease management including the following:  Signs and symptoms of Heart Failure When to call the physician Importance of daily weights Low sodium diet Fluid restriction Medication management Anticipated future follow-up appointments  Patient education given on each of the above topics.  Patient acknowledges understanding via teach back method and acceptance of all instructions.  Patient has scale at home: Yes Patient has pill box at home: Yes   Navigator will sign off at this time.   Celedonio Coil, RN, BSN [facility] Heart Failure Navigator Secure Chat Only

## 2024-01-29 NOTE — Progress Notes (Signed)
 PHARMACY - ANTICOAGULATION CONSULT NOTE  Pharmacy Consult for warfarin Indication: atrial fibrillation and MV stenosis  Patient Measurements: Height: 5' 5 (165.1 cm) Weight: 52 kg (114 lb 10.2 oz) IBW/kg (Calculated) : 57 HEPARIN  DW (KG): 51.3  Labs: Recent Labs    01/27/24 0948 01/28/24 1435 01/28/24 1549 01/28/24 1953 01/28/24 2147 01/29/24 0116  HGB  --  12.2  --   --   --   --   HCT  --  40.4  --   --   --   --   PLT  --  220  --   --   --   --   LABPROT  --   --  26.4*  --   --   --   INR 2.1  --  2.4*  --   --   --   CREATININE  --   --  0.63  --   --  1.26*  TROPONINIHS  --  20*  --  16 14  --     Estimated Creatinine Clearance: 24.4 mL/min (A) (by C-G formula based on SCr of 1.26 mg/dL (H)).   Medical History: Past Medical History:  Diagnosis Date   Aortic atherosclerosis (HCC)    a. 05/2021 TEE: GrIII atheroma plaque involving the asc, transverse, and desc Ao.   Asthma    CAD (coronary artery disease)    a. 04/2021 Cath: LM nl, LAD 85p/m, D1 80, RI nl, LCX nl, RCA nl; b. 07/2021 PCI: pLAD (2.75x26 Onyx Frontier DES), D1 (2.5x22 Onyx Frontier DES).   Carotid artery disease (HCC)    a. s/p R carotid stenting (9mm x 7mm x 4cm long Exact stent); b. 06/2021 U/S: RICA 40-59%, LICA 40-59%.   CHF (congestive heart failure) (HCC)    Community acquired pneumonia    Essential hypertension    Glaucoma    Hyperlipidemia    Mitral regurgitation    a. TTE 08/2015: EF 60-65%, normal wall motion, mild MR, mildly dilated left atrium measuring 40 mm, RVSF normal, PASP normal; b. 05/2021 TEE: Moderate MR.   Mitral stenosis    a. 05/2021 L/RHC: Sev MS w/ mean grad 13-69mmHg and MV area of 0.5-06.cm^2; b. 05/2021 TEE: EF 55-60%, no rwma, nl RV fxn, mod MR, mod MS (MV area by P1/2t: 1.61 cm^2 w/ mean grad of ).   Peripheral neuropathy    Persistent atrial fibrillation (HCC) 09/17/2015   a. s/p DCCV 11/15/2015; b. CHADS2VASc => 4 (HTN, age x 2, female)--> warfarin; c. 06/2021  recurrent afib-->amio added.   Squamous cell carcinoma of skin 12/18/2021   R dorsum hand, EDC   Squamous cell carcinoma of skin 12/31/2021   R hand dorsum, recurrent - excised 02/04/2022   Squamous cell carcinoma of skin 12/31/2021   L forearm - ED&C   Squamous cell carcinoma of skin 06/24/2022   R thumb webspace with wart virus - ED&C   Squamous cell carcinoma of skin 10/20/2022   L lat knee - tx with ED&C   Assessment: 89 yo F to resume Warfarin for Afib and MV stenosis. Admitted with [facility], suspected AECOPD/AECHF.  Hx: of CAD, A-fib, asthma/COPD  Per Anticoagulation clinic visit notes 01/27/24: Goal INR 2.0-3.0   Home Warfarin dose:  3.75 mg Sundays, Tuesdays, Thursdays and 2.5 mg all other days Patient had last dose of Warfarin yesterday 01/27/24 per med rec tech discussion w/ pt. Hgb 12.2  Plt 220  6/12 INR 2.4 6/13 INR 3.1  Goal of Therapy:  INR 2-3 Monitor platelets by anticoagulation protocol: Yes   Plan:  --INR is supratherapeutic. Will hold warfarin tonight given large jump in INR after yesterday's dose --Re-check INR tomorrow  Page Boast 01/29/2024

## 2024-01-29 NOTE — Hospital Course (Signed)
 Molly Hodges is a 89 y.o. female with medical history significant of COPD, asthma, hypertension, hyperlipidemia, hypothyroidism, atrial fibrillation on Coumadin , mitral valve prolapse, carotid artery stenosis, anemia, PVD, CAD, dCHF, who presents with SOB.  Patient is symptom progressively worsened, notably after steroids were tapered off. Upon arriving to hospital, patient was given IV steroids for exacerbation with COPD.  Patient also received 40 mg IV Lasix .

## 2024-01-29 NOTE — Progress Notes (Signed)
 Progress Note   Patient: Molly Hodges BJY:[id number] DOB: Aug 25, 1931 DOA: 01/28/2024     0 DOS: the patient was seen and examined on 01/29/2024   Brief hospital course: Molly Hodges is a 89 y.o. female with medical history significant of COPD, asthma, hypertension, hyperlipidemia, hypothyroidism, atrial fibrillation on Coumadin , mitral valve prolapse, carotid artery stenosis, anemia, PVD, CAD, dCHF, who presents with SOB.  Patient is symptom progressively worsened, notably after steroids were tapered off. Upon arriving to hospital, patient was given IV steroids for exacerbation with COPD.  Patient also received 40 mg IV Lasix .    Principal Problem:   COPD exacerbation (HCC) Active Problems:   Asthma exacerbation   Chronic diastolic CHF (congestive heart failure) (HCC)   Coronary artery disease   Myocardial injury   Hyperlipidemia   Essential hypertension   Atrial fibrillation, chronic (HCC)   Leukocytosis   Iron deficiency anemia   Assessment and Plan: COPD exacerbation. Chronic diastolic congestive heart failure. Moderate to severe mitral stenosis. Mild to moderate aortic regurgitation. Probable pulmonary hypertension. Patient had progressively worsening short of breath for the last few days, this is most likely is multifactorial.  She might had a COPD exacerbation with discontinuation with steroids.  She probably is steroid-dependent. She also had elevated BNP, but review prior labs, BNP always elevated.  She has received IV Lasix , volume status is better today. Her echocardiogram also showed due to severe mitral stenosis, she probably will have pulmonary hypertension.  All these conditions will contribute to her shortness of breath. For now, she will be treated with oral steroids.  At discharge, she probably will be maintained on lower dose steroids and start increased dose of diuretics. Patient need to follow-up with cardiology after discharge to look into the  possibility of mitral valve intervention. However, due to advanced age and multiple medical problems, prognosis is poor.  Coronary artery disease. Elevated troponin secondary to demand ischemia. Continue home treatment.  Acute kidney injury secondary to diuretics. Hyponatremia. Metabolic acidosis. Hold off additional IV Lasix , recheck labs tomorrow.  Hyperglycemia due to steroids, possible type 2 diabetes. Patient had a severe hyperglycemia after giving IV steroids, will continue sliding scale insulin  for now.  Iron deficient anemia. Continue ferrous sulfate .  Chronic atrial fibrillation. Continue warfarin and diltiazem .    Subjective:  Patient is still complaining short of breath with exertion, essentially no cough.  Physical Exam: Vitals:   01/29/24 0500 01/29/24 0512 01/29/24 0747 01/29/24 0805  BP:   107/67   Pulse:   67   Resp:   18   Temp:   97.9 F (36.6 C)   TempSrc:      SpO2:  97% 99% 98%  Weight: 52 kg     Height:       General exam: Appears calm and comfortable  Respiratory system: Clear to auscultation. Respiratory effort normal. Cardiovascular system: Irregularly irregular. No JVD, murmurs, rubs, gallops or clicks. No pedal edema. Gastrointestinal system: Abdomen is nondistended, soft and nontender. No organomegaly or masses felt. Normal bowel sounds heard. Central nervous system: Alert and oriented x3. No focal neurological deficits. Extremities: Symmetric 5 x 5 power. Skin: No rashes, lesions or ulcers Psychiatry: Mood & affect appropriate.    Data Reviewed:  Reviewed EKG, telemetry, prior echocardiogram, chest x-ray and lab results.  Family Communication: Daughter updated over the phone.  Disposition: Status is: Inpatient Remains inpatient appropriate because: Severity of disease,     Time spent: 60 minutes  Author: Donaciano Frizzle, MD  01/29/2024 11:36 AM  For on call review [email or web address].

## 2024-01-29 NOTE — Inpatient Diabetes Management (Addendum)
 Inpatient Diabetes Program Recommendations  AACE/ADA: New Consensus Statement on Inpatient Glycemic Control (2015)  Target Ranges:  Prepandial:   less than 140 mg/dL      Peak postprandial:   less than 180 mg/dL (1-2 hours)      Critically ill patients:  140 - 180 mg/dL    Latest Reference Range & Units 01/15/24 04:59  Hemoglobin A1C 4.8 - 5.6 % 8.1 (H)  (H): Data is abnormally high  Latest Reference Range & Units 01/29/24 01:16  Glucose 70 - 99 mg/dL 70 - 99 mg/dL 161 (HH) 096 (HH)    Latest Reference Range & Units 01/29/24 00:16 01/29/24 04:02 01/29/24 04:39 01/29/24 05:34  Glucose-Capillary 70 - 99 mg/dL  125 mg Solumedrol @1413  567 (HH) 500 (H)  25 units Novolog  @0334   20 units Novolog  @0407  451 (H)  20 units Novolog  @0458  433 (H)    Latest Reference Range & Units 01/29/24 07:44  Glucose-Capillary 70 - 99 mg/dL 045 (H)  (H): Data is abnormally high   Admit with: COPD and Asthma Exacerbation  No History of diabetes  Current Orders: Novolog  Resistant Correction Scale/ SSI (0-20 units) Q4H    Getting Solumedrol 40 mg daily  Was just here in hospital 05/29 thru 06/01 with COPD exacerbation  MD- Note A1c from 01/15/2024 was 8.1%.  Note that pt was receiving steroids when A1c was drawn. Is this a new diagnosis diabetes?    Addendum 12:30pm--Met w/ pt at bedside to discuss her admission glucose and current CBGs.  Pt told me she was shocked when she heard her glucose was >500 when she got here.  I explained to pt that the IV Steroids we are giving her for her breathing can cause hyperglycemia and that we are currently giving her insulin  Q4H to help and try bring her CBGs down to normal levels.  Pt stated understanding and was appreciative of the info.  She told me she lives at home with her 38 year old husband who also has CHF issues.  Explained to pt that we will check her CBGs Q4 hours and give insulin  based on a sliding scale that the MD has prescribed.  I discussed  with pt that she does not need to have prefect CBG control, however, having CBGs >250 all the time can lead to dehydration and other complications.  Discussed with pt that when she goes home (and if she needs steroids for home) that she may need some kind of medicine (either oral or injection) to help keep CBGs under control.  Pt asked me to call her Daughter [name] to discuss the above info.  I did get in touch with Dtr and reviewed all of the above info.  Dtr told me he has someone come to the pt's home for 3 hrs in the AM and 3hrs in the PM to help.  Dtr mentioned getting Hospice involved for home as both pt and her husband are nearing end of life.  Reviewed all of the above info with the Dtr [name] Baker and allowed for questions.  Dtr very appreciative of info and I did also discuss with Dtr that pt may need some sort of medication to keep her CBGs under control at home--Pt Dtr stated understanding.  I discussed with Dtr that ultimately the MD will decide what med (if any) for discharge.  I explained our goal would not be perfect CBG control but to keep the CBGs <250 at home.      --Will follow  patient during hospitalization--  Langston Pippins RN, MSN, CDCES Diabetes Coordinator Inpatient Glycemic Control Team Team Pager: [phone] (8a-5p)

## 2024-01-29 NOTE — TOC Initial Note (Addendum)
 Transition of Care [facility]) - Initial/Assessment Note    Patient Details  Name: Molly Hodges MRN: [id number] Date of Birth: 14-Nov-1931  [facility]) CM/SW Contact:    Odilia Bennett, LCSW Phone Number: 01/29/2024, 9:37 AM  Clinical Narrative:   Per chart review, patient was active with [facility] last admission. Liaison confirmed that patient is still active with PT and OT.       12:36 pm: Readmission prevention screen complete. CSW met with patient. No family at bedside. CSW introduced role and explained that discharge planning would be discussed. PCP is Jeralene Mom, MD. Friends or family transport him to appointments. She uses [facility]. No issues obtaining medications. Patient lives with her 35 year old husband. They have been married for 71 years. She has a RW, BSC, shower chair, and wheelchair at home. No further concerns. CSW will continue to follow patient for support and facilitate return home once stable. Daughter or daughter-in-law will transport her home at discharge.          Expected Discharge Plan: Home w Home Health Services Barriers to Discharge: Continued Medical Work up   Patient Goals and CMS Choice            Expected Discharge Plan and Services       Living arrangements for the past 2 months: Single Family Home                           HH Arranged: PT, OT HH Agency: [facility] Date HH Agency Contacted: 01/29/24   Representative spoke with at [facility] Agency: Shaun  Prior Living Arrangements/Services Living arrangements for the past 2 months: Single Family Home Lives with:: Spouse Patient language and need for interpreter reviewed:: Yes        Need for Family Participation in Patient Care: Yes (Comment)   Current home services: Home OT, Home PT Criminal Activity/Legal Involvement Pertinent to Current Situation/Hospitalization: No - Comment as needed  Activities of Daily Living   ADL  Screening (condition at time of admission) Independently performs ADLs?: Yes (appropriate for developmental age) Is the patient deaf or have difficulty hearing?: No Does the patient have difficulty seeing, even when wearing glasses/contacts?: No Does the patient have difficulty concentrating, remembering, or making decisions?: No  Permission Sought/Granted                  Emotional Assessment       Orientation: : Oriented to Self, Oriented to Place, Oriented to  Time, Oriented to Situation Alcohol / Substance Use: Not Applicable Psych Involvement: No (comment)  Admission diagnosis:  COPD exacerbation (HCC) [J44.1] Acute on chronic diastolic CHF (congestive heart failure) (HCC) [I50.33] Dyspnea, unspecified type [R06.00] Patient Active Problem List   Diagnosis Date Noted   Myocardial injury 01/28/2024   Leukocytosis 01/28/2024   Iron deficiency anemia 01/28/2024   Atrial fibrillation, chronic (HCC) 01/14/2024   Coronary artery disease 01/14/2024   Dyslipidemia 01/14/2024   Essential hypertension 01/14/2024   Acute on chronic diastolic CHF (congestive heart failure) (HCC) 01/14/2024   COPD exacerbation (HCC) 12/16/2023   COPD with acute exacerbation (HCC) 12/15/2023   Acute on chronic heart failure with preserved ejection fraction (HFpEF) (HCC) 12/13/2023   Acute respiratory distress 10/31/2022   Acute on chronic hypoxic respiratory failure (HCC) 10/30/2022   Asthma, chronic obstructive, with acute exacerbation (HCC) 08/06/2021   Chronic diastolic CHF (congestive heart failure) (HCC) 08/06/2021  Abnormal LFTs    Hypokalemia 08/03/2021   CAD (coronary artery disease) 08/02/2021   Moderate mitral regurgitation 06/17/2021   Mitral valve stenosis 05/17/2021   Dyspnea 01/01/2021   Chronic respiratory failure with hypoxia (HCC) 01/01/2021   PAD (peripheral artery disease) (HCC) 11/13/2020   Hypochromic microcytic anemia 09/17/2020   Hyperlipidemia 09/17/2020   Paroxysmal  atrial fibrillation (HCC) 08/30/2020   Carotid stenosis, right 08/15/2020   Carotid stenosis 07/17/2020   Meningioma (HCC) 07/04/2020   Cyst of thyroid  07/03/2020   Hypothyroidism 07/03/2020   Long-term use of high-risk medication 11/11/2016   COPD with acute bronchitis (HCC) 11/11/2016   Lower extremity edema 05/29/2016   Elevated troponin    Hypoxia    Encounter for anticoagulation discussion and counseling    Premature ventricular contraction 03/26/2015   Vitamin D  deficiency 03/26/2015   Peripheral neuropathy 03/26/2015   Hypertension 03/26/2015   Asthma exacerbation 03/26/2015   PCP:  Lamon Pillow, MD Pharmacy:   [facility] - [place], [place] - [address] [place] [address] Phone: [phone] Fax: [phone]  [facility] [address] [place] [address] Phone: [phone] Fax: [phone]     Social Drivers of Health (SDOH) Social History: SDOH Screenings   Food Insecurity: No Food Insecurity (01/28/2024)  Housing: Low Risk  (01/28/2024)  Transportation Needs: No Transportation Needs (01/28/2024)  Utilities: Not At Risk (01/28/2024)  Alcohol Screen: Low Risk  (12/29/2023)  Depression (PHQ2-9): Low Risk  (12/30/2023)  Financial Resource Strain: Low Risk  (01/15/2024)  Physical Activity: Insufficiently Active (12/29/2023)  Social Connections: Unknown (01/28/2024)  Recent Concern: Social Connections - Moderately Isolated (12/29/2023)  Stress: No Stress Concern Present (12/29/2023)  Tobacco Use: Low Risk  (01/25/2024)  Health Literacy: Adequate Health Literacy (12/29/2023)   SDOH Interventions:     Readmission Risk Interventions    08/07/2021    1:24 PM  Readmission Risk Prevention Plan  Transportation Screening Complete  PCP or Specialist Appt within 3-5 Days Complete  HRI or Home Care Consult Complete  Social Work Consult for Recovery Care Planning/Counseling Complete   Palliative Care Screening Not Applicable  Medication Review Oceanographer) Complete

## 2024-01-29 NOTE — Significant Event (Signed)
       CROSS COVER NOTE  NAME: Molly Hodges MRN: [id number] DOB : 10/05/1931 ATTENDING PHYSICIAN: Niu, Xilin, MD    Date of Service   01/29/2024   HPI/Events of Note   Message received from [facility], this patient ambulated to the bathroom and became diaphoretic upon standing/walking. She denied dizziness/lightheadedness with ambulation. VS after returning to bed were hypotensive with BP of 97/46 (MAP 59) and similar on manual BP cuff. HR remained stable in 70s. Thank you.  blood sugar is 567   Interventions   Assessment/Plan:    01/28/2024   11:55 PM 01/28/2024   11:51 PM 01/28/2024    8:38 PM  Vitals with BMI  Systolic 96 97 111  Diastolic 48 46 45  Pulse  71 75     Steroid induced hyperglycemia with AKI  LR 1 L bolus Decrease solumedrol for COPD exacerbation from 80 daily to 40 High dose sliding scale insulin  starting with 25 units every 4 h Check  A1C and beta hydrox       Kip Peon NP [facility] Cross Cover 7pm-7am - check amion for availability Pager [phone]

## 2024-01-30 DIAGNOSIS — I050 Rheumatic mitral stenosis: Secondary | ICD-10-CM

## 2024-01-30 DIAGNOSIS — I5032 Chronic diastolic (congestive) heart failure: Secondary | ICD-10-CM | POA: Diagnosis not present

## 2024-01-30 DIAGNOSIS — J441 Chronic obstructive pulmonary disease with (acute) exacerbation: Secondary | ICD-10-CM | POA: Diagnosis not present

## 2024-01-30 LAB — BASIC METABOLIC PANEL WITH GFR
Anion gap: 12 (ref 5–15)
BUN: 36 mg/dL — ABNORMAL HIGH (ref 8–23)
CO2: 24 mmol/L (ref 22–32)
Calcium: 8.5 mg/dL — ABNORMAL LOW (ref 8.9–10.3)
Chloride: 101 mmol/L (ref 98–111)
Creatinine, Ser: 0.9 mg/dL (ref 0.44–1.00)
GFR, Estimated: >60 mL/min (ref >60)
Glucose, Bld: 143 mg/dL — ABNORMAL HIGH (ref 70–99)
Potassium: 3.8 mmol/L (ref 3.5–5.1)
Sodium: 136 mmol/L (ref 135–145)

## 2024-01-30 LAB — GLUCOSE, CAPILLARY
Glucose-Capillary: 144 mg/dL — ABNORMAL HIGH (ref 70–99)
Glucose-Capillary: 201 mg/dL — ABNORMAL HIGH (ref 70–99)

## 2024-01-30 LAB — MAGNESIUM: Magnesium: 2.5 mg/dL — ABNORMAL HIGH (ref 1.7–2.4)

## 2024-01-30 LAB — PROTIME-INR
INR: 3.5 — ABNORMAL HIGH (ref 0.8–1.2)
Prothrombin Time: 35.4 s — ABNORMAL HIGH (ref 11.4–15.2)

## 2024-01-30 MED ORDER — PREDNISONE 20 MG PO TABS: ORAL_TABLET | ORAL | 0 refills | Status: AC

## 2024-01-30 MED ORDER — PREDNISONE 20 MG PO TABS
40.0000 mg | ORAL_TABLET | Freq: Every day | ORAL | Status: DC
  Administered 2024-01-30: 40 mg via ORAL
  Filled 2024-01-30: qty 2

## 2024-01-30 NOTE — Plan of Care (Signed)

## 2024-01-30 NOTE — Progress Notes (Signed)
 PHARMACY - ANTICOAGULATION CONSULT NOTE  Pharmacy Consult for warfarin Indication: atrial fibrillation and MV stenosis  Patient Measurements: Height: 5' 5 (165.1 cm) Weight: 52 kg (114 lb 10.2 oz) IBW/kg (Calculated) : 57 HEPARIN  DW (KG): 51.3  Labs: Recent Labs    01/28/24 1435 01/28/24 1549 01/28/24 1953 01/28/24 2147 01/29/24 0116 01/29/24 0945 01/30/24 0425  HGB 12.2  --   --   --   --   --   --   HCT 40.4  --   --   --   --   --   --   PLT 220  --   --   --   --   --   --   LABPROT  --  26.4*  --   --   --  32.0* 35.4*  INR  --  2.4*  --   --   --  3.1* 3.5*  CREATININE  --  0.63  --   --  1.26*  --  0.90  TROPONINIHS 20*  --  16 14  --   --   --     Estimated Creatinine Clearance: 34.1 mL/min (by C-G formula based on SCr of 0.9 mg/dL).   Medical History: Past Medical History:  Diagnosis Date   Aortic atherosclerosis (HCC)    a. 05/2021 TEE: GrIII atheroma plaque involving the asc, transverse, and desc Ao.   Asthma    CAD (coronary artery disease)    a. 04/2021 Cath: LM nl, LAD 85p/m, D1 80, RI nl, LCX nl, RCA nl; b. 07/2021 PCI: pLAD (2.75x26 Onyx Frontier DES), D1 (2.5x22 Onyx Frontier DES).   Carotid artery disease (HCC)    a. s/p R carotid stenting (9mm x 7mm x 4cm long Exact stent); b. 06/2021 U/S: RICA 40-59%, LICA 40-59%.   CHF (congestive heart failure) (HCC)    Community acquired pneumonia    Essential hypertension    Glaucoma    Hyperlipidemia    Mitral regurgitation    a. TTE 08/2015: EF 60-65%, normal wall motion, mild MR, mildly dilated left atrium measuring 40 mm, RVSF normal, PASP normal; b. 05/2021 TEE: Moderate MR.   Mitral stenosis    a. 05/2021 L/RHC: Sev MS w/ mean grad 13-49mmHg and MV area of 0.5-06.cm^2; b. 05/2021 TEE: EF 55-60%, no rwma, nl RV fxn, mod MR, mod MS (MV area by P1/2t: 1.61 cm^2 w/ mean grad of ).   Peripheral neuropathy    Persistent atrial fibrillation (HCC) 09/17/2015   a. s/p DCCV 11/15/2015; b. CHADS2VASc => 4  (HTN, age x 2, female)--> warfarin; c. 06/2021 recurrent afib-->amio added.   Squamous cell carcinoma of skin 12/18/2021   R dorsum hand, EDC   Squamous cell carcinoma of skin 12/31/2021   R hand dorsum, recurrent - excised 02/04/2022   Squamous cell carcinoma of skin 12/31/2021   L forearm - ED&C   Squamous cell carcinoma of skin 06/24/2022   R thumb webspace with wart virus - ED&C   Squamous cell carcinoma of skin 10/20/2022   L lat knee - tx with ED&C   Assessment: 89 yo F to resume Warfarin for Afib and MV stenosis. Admitted with [facility], suspected AECOPD/AECHF.  Hx: of CAD, A-fib, asthma/COPD  Per Anticoagulation clinic visit notes 01/27/24: Goal INR 2.0-3.0   Home Warfarin dose:  3.75 mg Sundays, Tuesdays, Thursdays and 2.5 mg all other days Patient had last dose of Warfarin yesterday 01/27/24 per med rec tech discussion w/ pt. Hgb 12.2  Plt  220  6/12 INR 2.4 Warfarin 3.5 mg 6/13 INR 3.1 HOLD 6/14 INR 3.5 HOLD   Goal of Therapy:  INR 2-3 Monitor platelets by anticoagulation protocol: Yes   Plan:  --INR is supratherapeutic. Will hold warfarin tonight given large jump in INR after yesterday's dose -Patient on steroids (methylprednisolone ) and torsemide  --follow up INR daily  Thomasine Flick PharmD Clinical Pharmacist 01/30/2024

## 2024-01-30 NOTE — Progress Notes (Signed)
 Patient has soft BP, 105/48 (62), other VSS. Patient asymptomatic, notified provider. No orders given, will continue to monitor. No other concern at the moment. Plan of care continued.

## 2024-01-30 NOTE — Discharge Summary (Addendum)
 Physician Discharge Summary   Patient: Molly Hodges MRN: [id number] DOB: 1932-05-26  Admit date:     01/28/2024  Discharge date: 01/30/24  Discharge Physician: Donaciano Frizzle   PCP: Lamon Pillow, MD   Recommendations at discharge:   Follow-up with PCP in 1 week. Follow-up with Dr. Gollan in 1 to 2 weeks, may need to refer to hospice if her mitral valve stenosis cannot be resolved. Hold warfarin for now until check INR on Monday.  Discharge Diagnoses: Principal Problem:   COPD exacerbation (HCC) Active Problems:   Asthma exacerbation   Chronic diastolic CHF (congestive heart failure) (HCC)   Coronary artery disease   Myocardial injury   Hyperlipidemia   Severe mitral valve stenosis   Essential hypertension   Atrial fibrillation, chronic (HCC)   Leukocytosis   Iron deficiency anemia  Resolved Problems:   * No resolved hospital problems. *  Hospital Course: Molly Hodges is a 89 y.o. female with medical history significant of COPD, asthma, hypertension, hyperlipidemia, hypothyroidism, atrial fibrillation on Coumadin , mitral valve prolapse, carotid artery stenosis, anemia, PVD, CAD, dCHF, who presents with SOB.  Patient is symptom progressively worsened, notably after steroids were tapered off. Upon arriving to hospital, patient was given IV steroids for exacerbation with COPD.  Patient also received 40 mg IV Lasix . Patient condition has improved today, medically stable for discharge.  Assessment and Plan: COPD exacerbation. Chronic diastolic congestive heart failure. Moderate to severe mitral stenosis. Mild to moderate aortic regurgitation. Probable pulmonary hypertension. Patient had progressively worsening short of breath for the last few days, this is most likely is multifactorial.  She might had a COPD exacerbation with discontinuation with steroids.  She probably is steroid-dependent. She also had elevated BNP, but review prior labs, BNP always elevated.  She  has received IV Lasix , volume status is better today. Her echocardiogram also showed due to severe mitral stenosis, she probably will have pulmonary hypertension.  All these conditions will contribute to her shortness of breath. Her condition is better today, she will be placed on long-term steroids, continue on diuretics.  Also discussed with daughter, can increase dose of steroids if short of breath is worse. Patient need to follow-up with cardiology after discharge to look into the possibility of mitral valve intervention.  If she is not a candidate for mitral valve surgery, she should be referred to hospice.   Coronary artery disease. Elevated troponin secondary to demand ischemia. Continue home treatment.   Acute kidney injury secondary to diuretics. Hyponatremia. Metabolic acidosis. Condition has been improved.  Resume home dose torsemide .   Hyperglycemia due to steroids, possible type 2 diabetes. Patient had a severe hyperglycemia after giving IV steroids, steroid will be tapered down.   Iron deficient anemia. Continue ferrous sulfate .   Chronic atrial fibrillation. INR has been around 3.4-3.5 in the hospital, patient has not been taking her warfarin.  Will hold warfarin until Monday, restart after checking INR in PCPs office.          Consultants: None Procedures performed: None  Disposition: Home health Diet recommendation:  Discharge Diet Orders (From admission, onward)     Start     Ordered   01/30/24 0000  Diet - low sodium heart healthy        01/30/24 1011           Cardiac diet DISCHARGE MEDICATION: Allergies as of 01/30/2024       Reactions   Shellfish Allergy Anaphylaxis   Other reaction(s): Hallucination  Azithromycin  Other (See Comments)   Extreme burning sensation at IV site   Tamiflu [oseltamivir Phosphate] Other (See Comments)   Reaction:  Hallucinations    Albuterol  Palpitations   Heart racing.    Albuterol  And Levalbuterol  Palpitations         Medication List     TAKE these medications    arformoterol  15 MCG/2ML Nebu Commonly known as: Brovana  Take 2 mLs (15 mcg total) by nebulization 2 (two) times daily.   ascorbic acid  500 MG tablet Commonly known as: VITAMIN C  Take 500 mg by mouth daily.   budesonide  0.5 MG/2ML nebulizer solution Commonly known as: PULMICORT  Take 2 mLs (0.5 mg total) by nebulization 2 (two) times daily.   cholecalciferol  1000 units tablet Commonly known as: VITAMIN D  Take 1,000 Units by mouth daily.   diltiazem  120 MG 24 hr capsule Commonly known as: CARDIZEM  CD TAKE 1 CAPSULE BY MOUTH EVERY DAY   dorzolamide  2 % ophthalmic solution Commonly known as: TRUSOPT  Place 1 drop into both eyes 2 (two) times daily.   ezetimibe  10 MG tablet Commonly known as: ZETIA  TAKE 1 TABLET BY MOUTH DAILY   ferrous sulfate  325 (65 FE) MG EC tablet Take 1 tablet (325 mg total) by mouth daily with breakfast.   ipratropium-albuterol  0.5-2.5 (3) MG/3ML Soln Commonly known as: DUONEB TAKE BY NEBULIZATION EVERY 4 HOURS AS NEEDED   latanoprost  0.005 % ophthalmic solution Commonly known as: XALATAN  Place 1 drop into both eyes at bedtime.   levalbuterol  45 MCG/ACT inhaler Commonly known as: Xopenex  HFA Inhale 2 puffs into the lungs every 6 (six) hours as needed for wheezing.   nitroGLYCERIN  0.4 MG SL tablet Commonly known as: Nitrostat  Place 1 tablet (0.4 mg total) under the tongue every 5 (five) minutes as needed (for chest pain or shortness of breath).   potassium chloride  10 MEQ tablet Commonly known as: KLOR-CON  TAKE 2 TABLETS BY MOUTH DAILY. TAKE EXTRA 2 TABLETS WHEN TAKING EXTRA LASIX .   predniSONE  20 MG tablet Commonly known as: DELTASONE  Take 2 tablets (40 mg total) by mouth daily with breakfast for 3 days, THEN 0.5 tablets (10 mg total) daily with breakfast. Start taking on: January 31, 2024   pregabalin  50 MG capsule Commonly known as: LYRICA  TAKE 1 CAPSULE BY MOUTH 2 TIMES  DAILY   rosuvastatin  20 MG tablet Commonly known as: CRESTOR  TAKE 1 TABLET BY MOUTH DAILY   torsemide  20 MG tablet Commonly known as: DEMADEX  Take 1 tablet (20 mg total) by mouth daily. You may take additional dose (20 mg tab) for 3 pound gain overnight or 5 pound gain in 1 week.   warfarin 2.5 MG tablet Commonly known as: COUMADIN  Take as directed. If you are unsure how to take this medication, talk to your nurse or doctor. Original instructions: TAKE 1 & 1/2 TABLETS DAILY EXCEPT TAKE ONE TABLET ON MONDAYS, WEDNESDAYS, AND FRIDAYS OR AS DIRECTED BY COUMADIN  CLINIC               Discharge Care Instructions  (From admission, onward)           Start     Ordered   01/30/24 0000  Discharge wound care:       Comments: none   01/30/24 1011            Follow-up Information     [facility]. Go on 02/12/2024.   Specialty: Cardiology Why: Hospital Follow-Up 02/12/24 @ 1:30 PM Contact information:  [address]  [place]  [address] [phone]        Lamon Pillow, MD Follow up in 1 week(s).   Specialty: Family Medicine Contact information: [address] [place] [address] [place] [address] [phone]         Devorah Fonder, MD Follow up in 1 week(s).   Specialty: Cardiology Contact information: [address] [place] [address] [phone]                Discharge Exam: Cleavon Curls Weights   01/28/24 1316 01/29/24 0500 01/30/24 0500  Weight: 51.3 kg 52 kg 52 kg   General exam: Appears calm and comfortable  Respiratory system: Clear to auscultation. Respiratory effort normal. Cardiovascular system: S1 & S2 heard, RRR. No JVD, murmurs, rubs, gallops or clicks. No pedal edema. Gastrointestinal system: Abdomen is nondistended, soft and nontender. No organomegaly or masses felt. Normal bowel sounds heard. Central nervous system: Alert and oriented. No focal  neurological deficits. Extremities: Symmetric 5 x 5 power. Skin: No rashes, lesions or ulcers Psychiatry: Judgement and insight appear normal. Mood & affect appropriate.    Condition at discharge: fair  The results of significant diagnostics from this hospitalization (including imaging, microbiology, ancillary and laboratory) are listed below for reference.   Imaging Studies: DG Chest Portable 1 View Result Date: 01/28/2024 CLINICAL DATA:  Shortness of breath. EXAM: PORTABLE CHEST 1 VIEW COMPARISON:  Chest radiograph dated 01/14/2024. FINDINGS: No focal consolidation, pleural effusion, pneumothorax. Stable cardiac silhouette. Coronary vascular calcification. Atherosclerotic calcification of the aorta. No acute osseous pathology. IMPRESSION: No active disease. Electronically Signed   By: Angus Bark M.D.   On: 01/28/2024 14:21   CT Angio Chest PE W and/or Wo Contrast Result Date: 01/14/2024 CLINICAL DATA:  Shortness of breath EXAM: CT ANGIOGRAPHY CHEST WITH CONTRAST TECHNIQUE: Multidetector CT imaging of the chest was performed using the standard protocol during bolus administration of intravenous contrast. Multiplanar CT image reconstructions and MIPs were obtained to evaluate the vascular anatomy. RADIATION DOSE REDUCTION: This exam was performed according to the departmental dose-optimization program which includes automated exposure control, adjustment of the mA and/or kV according to patient size and/or use of iterative reconstruction technique. CONTRAST:  75mL OMNIPAQUE  IOHEXOL  350 MG/ML SOLN COMPARISON:  12/15/2023 FINDINGS: Cardiovascular: Cardiomegaly. Aorta normal caliber with moderate atherosclerosis. Coronary artery disease. Stents noted in the left coronary arteries. No filling defects in the pulmonary arteries to suggest pulmonary emboli. Mediastinum/Nodes: Mediastinal adenopathy again noted, stable. No axillary or hilar adenopathy. Trachea and esophagus are unremarkable. Thyroid   unremarkable. Lungs/Pleura: Bronchial wall thickening again noted in the lower lobes. Associated ground-glass nodular airspace disease at the right lung base, slightly improved since prior study. Calcified granulomas in the lungs. No effusions or pneumothorax. Upper Abdomen: No acute findings Musculoskeletal: Chest wall soft tissues are unremarkable. No acute bony abnormality. Review of the MIP images confirms the above findings. IMPRESSION: No evidence of pulmonary embolus. Cardiomegaly, coronary artery disease. Stable mediastinal adenopathy. Chronic bronchial wall thickening and associated peripheral nodular densities at the right lung base, likely chronic small airways disease. Aortic Atherosclerosis (ICD10-I70.0). Electronically Signed   By: Janeece Mechanic M.D.   On: 01/14/2024 20:09   DG Chest 2 View Result Date: 01/14/2024 CLINICAL DATA:  shortness of breath EXAM: CHEST - 2 VIEW COMPARISON:  December 15, 2023 FINDINGS: A few small nodular opacities noted in the right mid and lower lung zones. Findings suggestive of bronchial wall thickening better visualized on the  comparison CT. No focal airspace consolidation, pleural effusion, or pneumothorax. Unchanged cardiomegaly. Tortuous aorta with aortic atherosclerosis. No acute fracture or destructive lesions. Multilevel thoracic osteophytosis. Osteopenia. Right AC joint osteoarthritis. IMPRESSION: Few small nodular opacities noted in the right mid and lower lung zones, which may reflect changes of an infectious or inflammatory bronchiolitis or aspiration. No lobar pneumonia or pleural effusion. Electronically Signed   By: Rance Burrows M.D.   On: 01/14/2024 16:06    Microbiology: Results for orders placed or performed during the hospital encounter of 01/28/24  Resp panel by RT-PCR (RSV, Flu A&B, Covid) Anterior Nasal Swab     Status: None   Collection Time: 01/28/24  8:30 PM   Specimen: Anterior Nasal Swab  Result Value Ref Range Status   SARS Coronavirus  2 by RT PCR NEGATIVE NEGATIVE Final    Comment: (NOTE) SARS-CoV-2 target nucleic acids are NOT DETECTED.  The SARS-CoV-2 RNA is generally detectable in upper respiratory specimens during the acute phase of infection. The lowest concentration of SARS-CoV-2 viral copies this assay can detect is 138 copies/mL. A negative result does not preclude SARS-Cov-2 infection and should not be used as the sole basis for treatment or other patient management decisions. A negative result may occur with  improper specimen collection/handling, submission of specimen other than nasopharyngeal swab, presence of viral mutation(s) within the areas targeted by this assay, and inadequate number of viral copies(<138 copies/mL). A negative result must be combined with clinical observations, patient history, and epidemiological information. The expected result is Negative.  Fact Sheet for Patients:  BloggerCourse.com  Fact Sheet for Healthcare Providers:  SeriousBroker.it  This test is no t yet approved or cleared by the [place]  FDA and  has been authorized for detection and/or diagnosis of SARS-CoV-2 by FDA under an Emergency Use Authorization (EUA). This EUA will remain  in effect (meaning this test can be used) for the duration of the COVID-19 declaration under Section 564(b)(1) of the Act, 21 U.S.C.section 360bbb-3(b)(1), unless the authorization is terminated  or revoked sooner.       Influenza A by PCR NEGATIVE NEGATIVE Final   Influenza B by PCR NEGATIVE NEGATIVE Final    Comment: (NOTE) The Xpert Xpress SARS-CoV-2/FLU/RSV plus assay is intended as an aid in the diagnosis of influenza from Nasopharyngeal swab specimens and should not be used as a sole basis for treatment. Nasal washings and aspirates are unacceptable for Xpert Xpress SARS-CoV-2/FLU/RSV testing.  Fact Sheet for Patients: BloggerCourse.com  Fact  Sheet for Healthcare Providers: SeriousBroker.it  This test is not yet approved or cleared by the [place]  FDA and has been authorized for detection and/or diagnosis of SARS-CoV-2 by FDA under an Emergency Use Authorization (EUA). This EUA will remain in effect (meaning this test can be used) for the duration of the COVID-19 declaration under Section 564(b)(1) of the Act, 21 U.S.C. section 360bbb-3(b)(1), unless the authorization is terminated or revoked.     Resp Syncytial Virus by PCR NEGATIVE NEGATIVE Final    Comment: (NOTE) Fact Sheet for Patients: BloggerCourse.com  Fact Sheet for Healthcare Providers: SeriousBroker.it  This test is not yet approved or cleared by the [place]  FDA and has been authorized for detection and/or diagnosis of SARS-CoV-2 by FDA under an Emergency Use Authorization (EUA). This EUA will remain in effect (meaning this test can be used) for the duration of the COVID-19 declaration under Section 564(b)(1) of the Act, 21 U.S.C. section 360bbb-3(b)(1), unless the authorization is terminated or revoked.  Performed at [facility], [address] [place], [place] [address]     Labs: CBC: Recent Labs  Lab 01/28/24 1435  WBC 18.1*  HGB 12.2  HCT 40.4  MCV 74.1*  PLT 220   Basic Metabolic Panel: Recent Labs  Lab 01/28/24 1549 01/29/24 0116 01/30/24 0425  NA 138 130* 136  K 3.6 3.8 3.8  CL 100 95* 101  CO2 27 20* 24  GLUCOSE 173* 561*  562* 143*  BUN 16 27* 36*  CREATININE 0.63 1.26* 0.90  CALCIUM  7.8* 7.7* 8.5*  MG  --  2.0 2.5*   Liver Function Tests: Recent Labs  Lab 01/29/24 0116  AST 27  ALT 23  ALKPHOS 55  BILITOT 1.1  PROT 5.5*  ALBUMIN 2.5*   CBG: Recent Labs  Lab 01/29/24 1530 01/29/24 2010 01/29/24 2324 01/30/24 0424 01/30/24 0839  GLUCAP 233* 305* 220* 144* 201*    Discharge time spent: greater than 30  minutes.  Signed: Donaciano Frizzle, MD [facility] 01/30/2024

## 2024-02-01 ENCOUNTER — Telehealth: Payer: Self-pay

## 2024-02-01 NOTE — Telephone Encounter (Unsigned)
 Copied from CRM [id number]. Topic: Clinical - Medical Advice >> Feb 01, 2024  8:58 AM Loreda Rodriguez T wrote: Reason for CRM: Tiffany from [facility] called stated patient advised that provider wanted her to hold off on PT for a while as patient is short of during therapy. Jyl Or is requesting a call back to verify if they need to continue to see patient or wait >> Feb 01, 2024  9:07 AM Stephany Ehrich wrote: Jyl Or from [facility] [phone] option 2.

## 2024-02-02 ENCOUNTER — Ambulatory Visit (INDEPENDENT_AMBULATORY_CARE_PROVIDER_SITE_OTHER): Admitting: Vascular Surgery

## 2024-02-02 ENCOUNTER — Encounter (INDEPENDENT_AMBULATORY_CARE_PROVIDER_SITE_OTHER)

## 2024-02-03 ENCOUNTER — Ambulatory Visit: Admitting: Internal Medicine

## 2024-02-03 NOTE — Progress Notes (Signed)
 Cardiology Clinic Note   Date: 02/08/2024 ID: Domini, Vandehei 11/05/1931, MRN [id number]  Primary Cardiologist:  Evalene Lunger, MD  Chief Complaint   Molly Hodges is a 89 y.o. female who presents to the clinic today for hospital follow up.   Patient Profile   Molly Hodges is followed by Dr. Gollan for the history outlined below.      Past medical history significant for: CAD. R/LHC 05/17/2021: Severe single-vessel CAD with 80 to 90% proximal to mid LAD involving large D1.  Proximal D1 80%.  Mildly elevated right ventricular pressures.  Moderate to severely elevated PA pressure and elevated pulmonary vascular resistance.  Severe mitral valve stenosis with moderate MR. LHC 08/02/2021 (angina): 80 to 90% ostial to mid LAD and proximal to mid D1.  OCT PCI with DES 2.75 x 26, to mid LAD, DES 2.5 x 22 mm to D1. LHC 08/07/2021 (NSTEMI): D1 #1 20%, #2 previously treated.  Widely patent LAD/diagonal stents with no significant restenosis.  Mild ostial stenosis in D1 with some IVC appearance but does not appear to be flow-limiting. Chronic diastolic heart failure/mitral valve stenosis. Echo 09/03/2023: EF 55 to 60%.  No RWMA.  Indeterminate diastolic parameters.  Normal RV size/function.  Moderate LAE.  Mild to moderate MR.  Moderate to severe mitral stenosis mean gradient 10.5 mmHg.  Mild to moderate AI.  Aortic valve sclerosis without stenosis. PAD. PAF. Onset January 2017 in the setting of sepsis (CAP and UTI). DCCV 11/15/2015. Carotid stenosis. Carotid duplex 12/23/2022: Right ICA 1 to 39%.  Left ICA 40 to 59%.  Disturbed flow left subclavian. Hypertension. Hyperlipidemia. Asthma. Hypothyroidism.  In summary, patient was diagnosed with A-fib January 2017 in the setting of pneumonia and UTI.  Echo at that time showed normal LV function with moderately dilated left atrium.  She was anticoagulated with Eliquis  and underwent cardioversion.  In 2022 she was experiencing some  dizziness and underwent cardiac monitoring through PCP which showed predominantly sinus rhythm with first-degree AV block and rare PACs/PVCs.  She was referred to pulmonology in April 2022 for dyspnea.  Echo at that time showed EF 55 to 60%, grade 2 DD, normal RV function, moderate to severe mitral stenosis.  She underwent R/LHC in September 2022 which demonstrated severe mitral stenosis and moderate MR as well as severe single-vessel CAD in the LAD.  She was referred to CT surgery and underwent TEE which demonstrated moderate mitral stenosis with moderate MR.  Given these findings she was scheduled to undergo PCI of DES on 08/02/2021.  She tolerated the procedure well and was discharged home.  On 08/05/2021 she began experiencing DOE and intermittent wheezing.  She felt fatigued throughout the day with worsening dyspnea.  The next morning she continued to have dyspnea and was transported to [facility] ED by EMS.  Initial labs showed BNP 555, troponin 1505>> 1284, D-dimer 12.17.  EKG showed rate controlled A-fib without acute changes.  CTA was negative for PE however lower lobe mucous plugging was noted with peribronchial thickening and patchy groundglass density concerning for atelectasis versus mild edema versus combination of the 2.  She underwent LHC which showed patent stents (further details above).  Her A-fib has been managed with amiodarone  and diltiazem  with Coumadin  for stroke prophylaxis.  Upon follow-up in 2023 it was discussed that she would be a high risk for recurrent arrhythmia secondary to mitral valve disease and dilated left atrium.  Amiodarone  was stopped in May 2023.  Patient is asymptomatic and  did not want to pursue further cardioversions   Patient was seen in the office on 08/07/2023 for evaluation of DOE and weakness.  Patient reported a 3-week history of increased dyspnea with minimal exertion and associated weakness.  She noted she had her husband had been sick at the beginning of December.   She had been diagnosed with a viral illness by urgent care.  Her PCP then called in medications including prednisone  and an antibiotic.  She developed a cough and shortness of breath prompting ED evaluation.  Her BNP was elevated to 401.  She did not lower extremity edema, abdominal bloating orthopnea.  Her daily weight had been stable.  She was euvolemic at the time of her visit.  She noted low BP at home and BP at the time of her visit was 89/40 on intake and 92/50 on my recheck.  Isosorbide  was stopped and torsemide  was decreased with instructions to take extra dose with increased weight.  Repeat echo showed mild to moderate MR with moderate to severe mitral stenosis as detailed above.   Patient was last seen in the office by me on 09/25/2023 for follow-up after testing.  She reported improved BP.  She stated she was able to do all normal activities by pacing herself.  Results of echo were discussed and patient declined referral to structural heart team.  She was interested in seeing pulmonology and a referral was made.  Patient was seen by Ellouise Class, FNP in the advanced heart failure clinic on 01/14/2024 for follow-up.  She reported increased shortness of breath and weakness over the last several days.  She was dyspneic with speaking and decision was made to send to the ER.  She was admitted for COPD exacerbation and acute on chronic diastolic heart failure.  She was diuresed with IV Lasix .  She was discharged on 01/17/2024.  She followed up with Ellouise Class, FNP on 01/25/2024 after hospital admission.  Her weight was down 3 pounds from prior visit.  Palliative referral was placed.  Patient underwent hospital admission from 01/28/2024 to 01/30/2024 for COPD exacerbation.     History of Present Illness    Today, patient is accompanied by her daughter. She is doing well since hospital discharge. Patient denies lower extremity edema, abdominal distention/fullness, orthopnea or PND. Shortness of breath at  baseline for patient.  No chest pain, pressure, or tightness. She will occasionally get palpitations with exertion but denies sustained palpitations. No blood in urine or stool.  Appetite has been good. Patient has not been checking BP at home. She denies lightheadedness, dizziness or presyncope. She does report fatigue. She has a palliative care visit scheduled for tomorrow to discuss hospice care. Her husband is already under home hospice care. Patient and daughter were urged to schedule this visit to discuss mitral stenosis. This has been discussed with patient at last visit and she opted to defer structural heart team referral. Patient states she is not interested in discussing options for mitral valve repair/replacement. Daughter is in agreement.     ROS: All other systems reviewed and are otherwise negative except as noted in History of Present Illness.  EKGs/Labs Reviewed       EKG is not ordered today.   01/29/2024: ALT 23; AST 27 01/30/2024: BUN 36; Creatinine, Ser 0.90; Potassium 3.8; Sodium 136   01/28/2024: Hemoglobin 12.2; WBC 18.1   01/05/2024: TSH 2.650   01/28/2024: B Natriuretic Peptide 537.4   Risk Assessment/Calculations     CHA2DS2-VASc Score = 6  This indicates a 9.7% annual risk of stroke. The patient's score is based upon: CHF History: 1 HTN History: 1 Diabetes History: 0 Stroke History: 0 Vascular Disease History: 1 Age Score: 2 Gender Score: 1             Physical Exam    VS:  BP (!) 80/50 (BP Location: Left Arm, Patient Position: Sitting, Cuff Size: Normal)   Pulse 80   Ht 5' 5 (1.651 m)   Wt 114 lb (51.7 kg)   SpO2 90%   BMI 18.97 kg/m  , BMI Body mass index is 18.97 kg/m.  GEN: Well nourished, well developed, in no acute distress. Neck: No JVD or carotid bruits. Cardiac: Irregularly irregular. No murmur. No rubs or gallops.   Respiratory:  Respirations regular and unlabored. Mild expiratory wheezing without rales, or rhonchi. GI: Soft,  nontender, nondistended. Extremities: Radials/DP/PT 2+ and equal bilaterally. No clubbing or cyanosis. No edema.  Skin: Warm and dry, no rash. Neuro: Strength intact.  Assessment & Plan   CAD S/p PCI with DES to mid LAD and DES to D1 September 2022.  Patient denies chest pain, pressure or tightness. She is able to tolerate some walking around her home.  -Continue Plavix , rosuvastatin , Zetia , as needed SL NTG.   Chronic diastolic heart failure/mitral stenosis Echo January 2025 showed normal LV/RV function, indeterminate diastolic parameters, moderate LAE, mild to moderate MR, moderate to severe mitral stenosis, mild to moderate AI.  Patient states she is not interested in discussing options for mitral valve repair/replacement. She does not feel she would do well undergoing surgery. Daughter is in agreement. Patient has a palliative care home visit scheduled for tomorrow to see if she is eligible for hospice. Her husband is already under home hospice care.  -Continue torsemide .  GDMT limited by hypotension. -Defer referral to structural heart team.  -Continue to follow with advanced heart failure clinic.    Permanent Afib Onset January 2017 in setting of sepsis.  DCCV March 2017.  Denies spontaneous bleeding concerns.  Patient reports occasional exertional palpitations that resolve with rest. She denies sustained palpitations. Irregularly irregular rhythm.  -Continue Coumadin , diltiazem .   Hypotension BP today 80/50 on intake and 98/50 on my recheck. BP is not checked at home. She denies lightheadedness, dizziness, presyncope. She endorses fatigue.  -Continue diltiazem . Hold dose if SBP <100.   Disposition: Keep scheduled visit with advanced heart failure clinic. Return in 6 months or sooner as needed.          Signed, Barnie HERO. Amnah Breuer, DNP, NP-C

## 2024-02-08 ENCOUNTER — Encounter: Payer: Self-pay | Admitting: Student

## 2024-02-08 ENCOUNTER — Other Ambulatory Visit: Payer: Self-pay | Admitting: *Deleted

## 2024-02-08 ENCOUNTER — Ambulatory Visit: Attending: Student | Admitting: Student

## 2024-02-08 VITALS — BP 80/50 | HR 80 | Ht 65.0 in | Wt 114.0 lb

## 2024-02-08 DIAGNOSIS — I050 Rheumatic mitral stenosis: Secondary | ICD-10-CM

## 2024-02-08 DIAGNOSIS — I959 Hypotension, unspecified: Secondary | ICD-10-CM | POA: Diagnosis not present

## 2024-02-08 DIAGNOSIS — I4821 Permanent atrial fibrillation: Secondary | ICD-10-CM | POA: Diagnosis not present

## 2024-02-08 DIAGNOSIS — I5032 Chronic diastolic (congestive) heart failure: Secondary | ICD-10-CM | POA: Diagnosis not present

## 2024-02-08 DIAGNOSIS — I2510 Atherosclerotic heart disease of native coronary artery without angina pectoris: Secondary | ICD-10-CM

## 2024-02-08 NOTE — Patient Instructions (Signed)
 Medication Instructions:  - moving forward, hold diltiazem  (cardizem ) for a systolic blood pressure less than 100  *If you need a refill on your cardiac medications before your next appointment, please call your pharmacy*  Lab Work: No labs ordered today  If you have labs (blood work) drawn today and your tests are completely normal, you will receive your results only by: MyChart Message (if you have MyChart) OR A paper copy in the mail If you have any lab test that is abnormal or we need to change your treatment, we will call you to review the results.  Testing/Procedures: No test ordered today   Follow-Up: At [facility], you and your health needs are our priority.  As part of our continuing mission to provide you with exceptional heart care, our providers are all part of one team.  This team includes your primary Cardiologist (physician) and Advanced Practice Providers or APPs (Physician Assistants and Nurse Practitioners) who all work together to provide you with the care you need, when you need it.  Your next appointment:   6 month(s)  Provider:   You may see Timothy Gollan, MD or one of the following Advanced Practice Providers on your designated Care Team:   Lonni Meager, NP Lesley Maffucci, PA-C Bernardino Bring, PA-C Cadence De Pere, PA-C Tylene Lunch, NP Barnie Hila, NP    We recommend signing up for the patient portal called MyChart.  Sign up information is provided on this After Visit Summary.  MyChart is used to connect with patients for Virtual Visits (Telemedicine).  Patients are able to view lab/test results, encounter notes, upcoming appointments, etc.  Non-urgent messages can be sent to your provider as well.   To learn more about what you can do with MyChart, go to [email or web address].   Other Instructions Keep follow up appointment with advanced heart failure clinic.

## 2024-02-10 ENCOUNTER — Other Ambulatory Visit: Payer: Self-pay | Admitting: *Deleted

## 2024-02-10 ENCOUNTER — Telehealth: Payer: Self-pay

## 2024-02-10 ENCOUNTER — Encounter: Payer: Self-pay | Admitting: *Deleted

## 2024-02-10 NOTE — Patient Outreach (Signed)
 Complex Care Management   Visit Note  02/10/2024  Name:  Molly Hodges MRN: [id number] DOB: [birth year]  Situation: Referral received for Complex Care Management related to Heart Failure I obtained verbal consent from caregiver/daughter, Molly Hodges.  Visit completed with Molly Hodges  on the phone. The purpose of this call was to determine if patient transitioned from Palliative care to Hospice. Verified that she has transitioned to Hospice care at home.   Background:   Past Medical History:  Diagnosis Date   Aortic atherosclerosis (HCC)    a. 05/2021 TEE: GrIII atheroma plaque involving the asc, transverse, and desc Ao.   Asthma    CAD (coronary artery disease)    a. 04/2021 Cath: LM nl, LAD 85p/m, D1 80, RI nl, LCX nl, RCA nl; b. 07/2021 PCI: pLAD (2.75x26 Onyx Frontier DES), D1 (2.5x22 Onyx Frontier DES).   Carotid artery disease (HCC)    a. s/p R carotid stenting (9mm x 7mm x 4cm long Exact stent); b. 06/2021 U/S: RICA 40-59%, LICA 40-59%.   CHF (congestive heart failure) (HCC)    Community acquired pneumonia    Essential hypertension    Glaucoma    Hyperlipidemia    Mitral regurgitation    a. TTE 08/2015: EF 60-65%, normal wall motion, mild MR, mildly dilated left atrium measuring 40 mm, RVSF normal, PASP normal; b. 05/2021 TEE: Moderate MR.   Mitral stenosis    a. 05/2021 L/RHC: Sev MS w/ mean grad 13-38mmHg and MV area of 0.5-06.cm^2; b. 05/2021 TEE: EF 55-60%, no rwma, nl RV fxn, mod MR, mod MS (MV area by P1/2t: 1.61 cm^2 w/ mean grad of ).   Peripheral neuropathy    Persistent atrial fibrillation (HCC) 09/17/2015   a. s/p DCCV 11/15/2015; b. CHADS2VASc => 4 (HTN, age x 2, female)--> warfarin; c. 06/2021 recurrent afib-->amio added.   Squamous cell carcinoma of skin 12/18/2021   R dorsum hand, EDC   Squamous cell carcinoma of skin 12/31/2021   R hand dorsum, recurrent - excised 02/04/2022   Squamous cell carcinoma of skin 12/31/2021   L forearm - ED&C   Squamous cell carcinoma of  skin 06/24/2022   R thumb webspace with wart virus - ED&C   Squamous cell carcinoma of skin 10/20/2022   L lat knee - tx with ED&C    Assessment: Patient Reported Symptoms:  Cognitive Cognitive Status: Alert and oriented to person, place, and time, Normal speech and language skills Cognitive/Intellectual Conditions Management [RPT]: None reported or documented in medical history or problem list   Health Maintenance Behaviors: Annual physical exam Healing Pattern: Unsure Health Facilitated by: Rest  Neurological Neurological Review of Symptoms: Not assessed    HEENT HEENT Symptoms Reported: Not assessed      Cardiovascular Cardiovascular Symptoms Reported: Not assessed    Respiratory Respiratory Symptoms Reported: Not assesed    Endocrine Patient reports the following symptoms related to hypoglycemia or hyperglycemia : Not assessed Is patient diabetic?: No    Gastrointestinal Gastrointestinal Symptoms Reported: Not assessed      Genitourinary Genitourinary Symptoms Reported: Not assessed    Integumentary Integumentary Symptoms Reported: Not assessed    Musculoskeletal Musculoskelatal Symptoms Reviewed: Not assessed        Psychosocial Psychosocial Symptoms Reported: Not assessed            12/30/2023   10:23 AM  Depression screen PHQ 2/9  Decreased Interest 0  Down, Depressed, Hopeless 0  PHQ - 2 Score 0    There were no vitals  filed for this visit.  Medications Reviewed Today     Reviewed by Charlsie Josette SAILOR, RN (Registered Nurse) on 02/10/24 at 1012  Med List Status: <None>   Medication Order Taking? Sig Documenting Provider Last Dose Status Informant  arformoterol  (BROVANA ) 15 MCG/2ML NEBU [id number]  Take 2 mLs (15 mcg total) by nebulization 2 (two) times daily. Kasa, Kurian, MD  Active Self, Pharmacy Records  budesonide  (PULMICORT ) 0.5 MG/2ML nebulizer solution [id number]  Take 2 mLs (0.5 mg total) by nebulization 2 (two) times daily. Kasa, Kurian, MD   Active Self, Pharmacy Records  cholecalciferol  (VITAMIN D ) 1000 UNITS tablet [id number]  Take 1,000 Units by mouth daily. [provider]  Active Self, Pharmacy Records  diltiazem  (CARDIZEM  CD) 120 MG 24 hr capsule [id number]  TAKE 1 CAPSULE BY MOUTH EVERY DAY Gollan, Timothy J, MD  Active Pharmacy Records, Self  dorzolamide  (TRUSOPT ) 2 % ophthalmic solution [id number]  Place 1 drop into both eyes 2 (two) times daily.  [provider]  Active Self, Pharmacy Records  ezetimibe  (ZETIA ) 10 MG tablet [id number]  TAKE 1 TABLET BY MOUTH DAILY Gollan, Timothy J, MD  Active Self, Pharmacy Records  ferrous sulfate  325 (65 FE) MG EC tablet [id number]  Take 1 tablet (325 mg total) by mouth daily with breakfast. Leesa Kast, DO  Active Self, Pharmacy Records  ipratropium-albuterol  (DUONEB) 0.5-2.5 (3) MG/3ML SOLN [id number]  TAKE BY NEBULIZATION EVERY 4 HOURS AS NEEDED Gasper Nancyann BRAVO, MD  Active Self, Pharmacy Records           Med Note Emerson, NANCY J   Wed Dec 16, 2023  2:28 AM) Pt prefers not to use because of side effects (heart racing).  latanoprost  (XALATAN ) 0.005 % ophthalmic solution [id number]  Place 1 drop into both eyes at bedtime.  [provider]  Active Self, Pharmacy Records  levalbuterol  (XOPENEX  HFA) 45 MCG/ACT inhaler [id number]  Inhale 2 puffs into the lungs every 6 (six) hours as needed for wheezing. Gasper Nancyann BRAVO, MD  Active Pharmacy Records, Self  nitroGLYCERIN  (NITROSTAT ) 0.4 MG SL tablet [id number]  Place 1 tablet (0.4 mg total) under the tongue every 5 (five) minutes as needed (for chest pain or shortness of breath). Loistine Sober, NP  Active Self, Pharmacy Records           Med Note RAYNE, ARLEY M   Thu Jan 28, 2024  7:28 PM) PRN  potassium chloride  (KLOR-CON ) 10 MEQ tablet [id number]  TAKE 2 TABLETS BY MOUTH DAILY. TAKE EXTRA 2 TABLETS WHEN TAKING EXTRA LASIX . Gollan, Timothy J, MD  Active Self, Pharmacy Records  predniSONE  (DELTASONE ) 20 MG tablet  [id number]  Take 2 tablets (40 mg total) by mouth daily with breakfast for 3 days, THEN 0.5 tablets (10 mg total) daily with breakfast. Laurita Pillion, MD  Active   pregabalin  (LYRICA ) 50 MG capsule [id number]  TAKE 1 CAPSULE BY MOUTH 2 TIMES DAILY Gasper Nancyann BRAVO, MD  Active Self, Pharmacy Records  rosuvastatin  (CRESTOR ) 20 MG tablet [id number]  TAKE 1 TABLET BY MOUTH DAILY Gollan, Timothy J, MD  Active Self, Pharmacy Records  torsemide  (DEMADEX ) 20 MG tablet [id number]  Take 1 tablet (20 mg total) by mouth daily. You may take additional dose (20 mg tab) for 3 pound gain overnight or 5 pound gain in 1 week. Loistine Sober, NP  Active Self, Pharmacy Records  vitamin C  (ASCORBIC ACID ) 500 MG tablet [id number]  Take 500 mg by mouth daily. [provider]  Active Self, Pharmacy  Records  warfarin (COUMADIN ) 2.5 MG tablet [id number]  TAKE 1 & 1/2 TABLETS DAILY EXCEPT TAKE ONE TABLET ON MONDAYS, WEDNESDAYS, AND FRIDAYS OR AS DIRECTED BY COUMADIN  CLINIC Gollan, Timothy J, MD  Active Self, Pharmacy Records            Recommendation:   Work with Hospice  Follow Up Plan:   Closing From:  Complex Care Management  Josette Pellet, RN, BSN [place]  [facility] Population Health RN Care Manager Direct Dial: [phone]  Fax: [phone]

## 2024-02-10 NOTE — Patient Instructions (Signed)
 Visit Information  Thank you for taking time to visit with me today. Please don't hesitate to contact me if I can be of assistance to you before our next scheduled appointment.  Closing From: Complex Care Management.  Please call the care guide team at [phone] if you need to cancel, schedule, or reschedule an appointment.   Please call 911 if you are experiencing a Mental Health or Behavioral Health Crisis or need someone to talk to.  Josette Pellet, RN, BSN [place]  [facility] - Inpatient [facility] RN Care Manager Direct Dial: [phone]  Fax: [phone]

## 2024-02-10 NOTE — Telephone Encounter (Signed)
 Copied from CRM [id number]. Topic: Clinical - Home Health Verbal Orders >> Feb 10, 2024 11:45 AM Delon DASEN wrote: Caller/Agency: Charmaine with [facility] Callback Number: [phone] Service Requested: Hospice care Frequency: n/a Any new concerns about the patient? Yes-   need to know if Dr Gasper is going to be the attending doctor and need to know if she has 6 mos or less, will he manage her symptoms. Call or fax - fax [phone]

## 2024-02-11 ENCOUNTER — Ambulatory Visit: Admitting: Internal Medicine

## 2024-02-12 ENCOUNTER — Encounter: Admitting: Family

## 2024-02-12 ENCOUNTER — Telehealth: Payer: Self-pay

## 2024-02-12 NOTE — Telephone Encounter (Signed)
 Ok, to change to Hospice.

## 2024-02-12 NOTE — Telephone Encounter (Signed)
 Copied from CRM [id number]. Topic: Medical Record Request - Provider/Facility Request >> Feb 12, 2024  3:28 PM Ivette P wrote: Reason for CRM: Connell called in to request if  pt can be removed from palliative care and transferred to hospice care    Would like a follow up   Calllback [phone] - secured Line

## 2024-02-12 NOTE — Telephone Encounter (Signed)
 Spoke with cherry and was given provider's verbal ok to removed patient form palliative care and transferred to hospice care.

## 2024-02-13 NOTE — Telephone Encounter (Signed)
 Yes, ii will be attending from [facility]. Her life expectancy is < 6 months.

## 2024-02-16 NOTE — Telephone Encounter (Signed)
 Advised

## 2024-03-07 ENCOUNTER — Encounter: Admitting: Family

## 2024-03-09 ENCOUNTER — Encounter

## 2024-03-14 ENCOUNTER — Encounter: Admitting: Family

## 2024-03-17 ENCOUNTER — Ambulatory Visit: Admitting: Pulmonary Disease

## 2024-04-19 ENCOUNTER — Other Ambulatory Visit: Payer: Self-pay | Admitting: Cardiovascular Disease

## 2024-04-19 DIAGNOSIS — I480 Paroxysmal atrial fibrillation: Secondary | ICD-10-CM

## 2024-04-19 NOTE — Telephone Encounter (Addendum)
 Warfarin 2.5mg  refill Afib Last INR 01/27/24 and was due 01/27/24 and canceled appt states she is in hospice. Will need to call and clarify if still taking Last OV 02/08/24  Spoke with pt's daughter and she states palliative care has been coming out and the pt is taking warfarin and that the nurse told her that Dr. Deane Finder with Hospice sent in the refill already to [facility], she states she does not know the last time it was checked and will ask the nurse tomorrow since I have inquired if it was being checked.  I will follow up with [facility] tomorrow

## 2024-04-19 NOTE — Telephone Encounter (Signed)
 Refill request

## 2024-04-20 NOTE — Telephone Encounter (Signed)
 Spoke with [facility] and confirmed that Dr. Deane Finder sent in the patient's warfarin refill on yesterday. Will deny this refill that we received since pt has not had INR checked since 01/2024.

## 2024-07-07 ENCOUNTER — Ambulatory Visit: Payer: Medicare Other | Admitting: Dermatology

## 2024-08-18 DEATH — deceased

## 2025-01-04 ENCOUNTER — Ambulatory Visit
